# Patient Record
Sex: Female | Born: 1940 | Race: Black or African American | Hispanic: No | Smoking: Former smoker
Health system: Southern US, Community
[De-identification: ages and names within clinical notes are randomized; demographics above are authoritative.]

## PROBLEM LIST (undated history)

## (undated) ENCOUNTER — Emergency Department (HOSPITAL_COMMUNITY): Disposition: A | Discharge status: Other Acute Inpatient Hospital | Payer: Medicare HMO

## (undated) DIAGNOSIS — I447 Left bundle-branch block, unspecified: Secondary | ICD-10-CM

## (undated) DIAGNOSIS — E785 Hyperlipidemia, unspecified: Secondary | ICD-10-CM

## (undated) DIAGNOSIS — N289 Disorder of kidney and ureter, unspecified: Secondary | ICD-10-CM

## (undated) DIAGNOSIS — L309 Dermatitis, unspecified: Secondary | ICD-10-CM

## (undated) DIAGNOSIS — E119 Type 2 diabetes mellitus without complications: Secondary | ICD-10-CM

## (undated) DIAGNOSIS — I10 Essential (primary) hypertension: Secondary | ICD-10-CM

## (undated) DIAGNOSIS — J329 Chronic sinusitis, unspecified: Secondary | ICD-10-CM

## (undated) DIAGNOSIS — M1990 Unspecified osteoarthritis, unspecified site: Secondary | ICD-10-CM

## (undated) DIAGNOSIS — K219 Gastro-esophageal reflux disease without esophagitis: Secondary | ICD-10-CM

## (undated) DIAGNOSIS — Z9989 Dependence on other enabling machines and devices: Secondary | ICD-10-CM

## (undated) DIAGNOSIS — G43909 Migraine, unspecified, not intractable, without status migrainosus: Secondary | ICD-10-CM

## (undated) DIAGNOSIS — R51 Headache: Secondary | ICD-10-CM

## (undated) DIAGNOSIS — R519 Headache, unspecified: Secondary | ICD-10-CM

## (undated) DIAGNOSIS — D472 Monoclonal gammopathy: Secondary | ICD-10-CM

## (undated) DIAGNOSIS — I82409 Acute embolism and thrombosis of unspecified deep veins of unspecified lower extremity: Secondary | ICD-10-CM

## (undated) DIAGNOSIS — E039 Hypothyroidism, unspecified: Secondary | ICD-10-CM

## (undated) DIAGNOSIS — G4733 Obstructive sleep apnea (adult) (pediatric): Secondary | ICD-10-CM

## (undated) HISTORY — DX: Dermatitis, unspecified: L30.9

## (undated) HISTORY — DX: Type 2 diabetes mellitus without complications: E11.9

## (undated) HISTORY — DX: Obstructive sleep apnea (adult) (pediatric): G47.33

## (undated) HISTORY — DX: Hypothyroidism, unspecified: E03.9

## (undated) HISTORY — PX: CARPAL TUNNEL RELEASE: SHX101

## (undated) HISTORY — PX: PLANTAR FASCIA RELEASE: SHX2239

## (undated) HISTORY — DX: Chronic sinusitis, unspecified: J32.9

## (undated) HISTORY — PX: NASAL SINUS SURGERY: SHX719

## (undated) HISTORY — DX: Dependence on other enabling machines and devices: Z99.89

## (undated) HISTORY — DX: Unspecified osteoarthritis, unspecified site: M19.90

## (undated) HISTORY — DX: Left bundle-branch block, unspecified: I44.7

## (undated) HISTORY — DX: Hyperlipidemia, unspecified: E78.5

---

## 1963-02-05 HISTORY — PX: TONSILLECTOMY: SUR1361

## 1967-02-05 HISTORY — PX: ABDOMINAL HYSTERECTOMY: SHX81

## 1974-02-04 HISTORY — PX: CHOLECYSTECTOMY: SHX55

## 1997-06-15 ENCOUNTER — Other Ambulatory Visit: Admission: RE | Admit: 1997-06-15 | Discharge: 1997-06-15 | Payer: Self-pay | Admitting: Otolaryngology

## 1998-11-10 ENCOUNTER — Ambulatory Visit (HOSPITAL_BASED_OUTPATIENT_CLINIC_OR_DEPARTMENT_OTHER): Admission: RE | Admit: 1998-11-10 | Discharge: 1998-11-10 | Payer: Self-pay | Admitting: Orthopedic Surgery

## 1999-01-17 ENCOUNTER — Encounter: Payer: Self-pay | Admitting: Family Medicine

## 1999-01-17 ENCOUNTER — Ambulatory Visit (HOSPITAL_COMMUNITY): Admission: RE | Admit: 1999-01-17 | Discharge: 1999-01-17 | Payer: Self-pay

## 1999-03-13 ENCOUNTER — Encounter (INDEPENDENT_AMBULATORY_CARE_PROVIDER_SITE_OTHER): Payer: Self-pay | Admitting: *Deleted

## 1999-03-13 ENCOUNTER — Ambulatory Visit (HOSPITAL_BASED_OUTPATIENT_CLINIC_OR_DEPARTMENT_OTHER): Admission: RE | Admit: 1999-03-13 | Discharge: 1999-03-13 | Payer: Self-pay | Admitting: Orthopedic Surgery

## 2000-03-14 ENCOUNTER — Ambulatory Visit (HOSPITAL_COMMUNITY): Admission: RE | Admit: 2000-03-14 | Discharge: 2000-03-14 | Payer: Self-pay | Admitting: Family Medicine

## 2000-03-14 ENCOUNTER — Encounter: Payer: Self-pay | Admitting: Family Medicine

## 2000-05-06 ENCOUNTER — Encounter: Payer: Self-pay | Admitting: Family Medicine

## 2000-05-06 ENCOUNTER — Ambulatory Visit (HOSPITAL_COMMUNITY): Admission: RE | Admit: 2000-05-06 | Discharge: 2000-05-06 | Payer: Self-pay | Admitting: Family Medicine

## 2000-06-05 ENCOUNTER — Emergency Department (HOSPITAL_COMMUNITY): Admission: EM | Admit: 2000-06-05 | Discharge: 2000-06-05 | Payer: Self-pay

## 2000-11-16 ENCOUNTER — Ambulatory Visit (HOSPITAL_BASED_OUTPATIENT_CLINIC_OR_DEPARTMENT_OTHER): Admission: RE | Admit: 2000-11-16 | Discharge: 2000-11-16 | Payer: Self-pay | Admitting: Family Medicine

## 2001-01-23 ENCOUNTER — Ambulatory Visit (HOSPITAL_COMMUNITY): Admission: RE | Admit: 2001-01-23 | Discharge: 2001-01-23 | Payer: Self-pay | Admitting: Family Medicine

## 2001-07-22 ENCOUNTER — Encounter: Payer: Self-pay | Admitting: Family Medicine

## 2001-07-22 ENCOUNTER — Ambulatory Visit (HOSPITAL_COMMUNITY): Admission: RE | Admit: 2001-07-22 | Discharge: 2001-07-22 | Payer: Self-pay | Admitting: Family Medicine

## 2001-09-06 ENCOUNTER — Emergency Department (HOSPITAL_COMMUNITY): Admission: EM | Admit: 2001-09-06 | Discharge: 2001-09-07 | Payer: Self-pay | Admitting: Emergency Medicine

## 2001-09-07 ENCOUNTER — Encounter: Payer: Self-pay | Admitting: Emergency Medicine

## 2001-09-08 ENCOUNTER — Encounter: Admission: RE | Admit: 2001-09-08 | Discharge: 2001-11-17 | Payer: Self-pay | Admitting: Internal Medicine

## 2001-09-19 ENCOUNTER — Emergency Department (HOSPITAL_COMMUNITY): Admission: EM | Admit: 2001-09-19 | Discharge: 2001-09-19 | Payer: Self-pay | Admitting: Emergency Medicine

## 2001-09-19 ENCOUNTER — Encounter: Payer: Self-pay | Admitting: Emergency Medicine

## 2001-11-18 ENCOUNTER — Emergency Department (HOSPITAL_COMMUNITY): Admission: EM | Admit: 2001-11-18 | Discharge: 2001-11-18 | Payer: Self-pay | Admitting: Emergency Medicine

## 2003-02-09 ENCOUNTER — Encounter (INDEPENDENT_AMBULATORY_CARE_PROVIDER_SITE_OTHER): Payer: Self-pay | Admitting: *Deleted

## 2003-02-09 ENCOUNTER — Ambulatory Visit (HOSPITAL_COMMUNITY): Admission: RE | Admit: 2003-02-09 | Discharge: 2003-02-09 | Payer: Self-pay | Admitting: Gastroenterology

## 2003-09-24 ENCOUNTER — Emergency Department (HOSPITAL_COMMUNITY): Admission: EM | Admit: 2003-09-24 | Discharge: 2003-09-24 | Payer: Self-pay | Admitting: *Deleted

## 2003-11-23 ENCOUNTER — Emergency Department (HOSPITAL_COMMUNITY): Admission: EM | Admit: 2003-11-23 | Discharge: 2003-11-23 | Payer: Self-pay | Admitting: Emergency Medicine

## 2004-02-12 ENCOUNTER — Emergency Department (HOSPITAL_COMMUNITY): Admission: EM | Admit: 2004-02-12 | Discharge: 2004-02-12 | Payer: Self-pay | Admitting: Family Medicine

## 2004-02-25 ENCOUNTER — Emergency Department (HOSPITAL_COMMUNITY): Admission: EM | Admit: 2004-02-25 | Discharge: 2004-02-26 | Payer: Self-pay | Admitting: Emergency Medicine

## 2004-07-05 ENCOUNTER — Encounter: Admission: RE | Admit: 2004-07-05 | Discharge: 2004-07-05 | Payer: Self-pay | Admitting: Internal Medicine

## 2004-07-13 ENCOUNTER — Encounter: Admission: RE | Admit: 2004-07-13 | Discharge: 2004-07-13 | Payer: Self-pay | Admitting: Internal Medicine

## 2004-08-31 ENCOUNTER — Emergency Department (HOSPITAL_COMMUNITY): Admission: EM | Admit: 2004-08-31 | Discharge: 2004-08-31 | Payer: Self-pay | Admitting: Family Medicine

## 2004-09-03 ENCOUNTER — Emergency Department (HOSPITAL_COMMUNITY): Admission: EM | Admit: 2004-09-03 | Discharge: 2004-09-03 | Payer: Self-pay | Admitting: Family Medicine

## 2004-09-19 ENCOUNTER — Emergency Department (HOSPITAL_COMMUNITY): Admission: EM | Admit: 2004-09-19 | Discharge: 2004-09-19 | Payer: Self-pay | Admitting: Family Medicine

## 2004-10-15 ENCOUNTER — Emergency Department (HOSPITAL_COMMUNITY): Admission: EM | Admit: 2004-10-15 | Discharge: 2004-10-16 | Payer: Self-pay | Admitting: Emergency Medicine

## 2005-01-17 ENCOUNTER — Encounter: Admission: RE | Admit: 2005-01-17 | Discharge: 2005-01-17 | Payer: Self-pay | Admitting: Internal Medicine

## 2005-03-10 ENCOUNTER — Emergency Department (HOSPITAL_COMMUNITY): Admission: EM | Admit: 2005-03-10 | Discharge: 2005-03-10 | Payer: Self-pay | Admitting: Family Medicine

## 2005-04-17 ENCOUNTER — Emergency Department (HOSPITAL_COMMUNITY): Admission: EM | Admit: 2005-04-17 | Discharge: 2005-04-18 | Payer: Self-pay | Admitting: Emergency Medicine

## 2005-07-06 ENCOUNTER — Emergency Department (HOSPITAL_COMMUNITY): Admission: EM | Admit: 2005-07-06 | Discharge: 2005-07-06 | Payer: Self-pay | Admitting: Family Medicine

## 2005-07-22 ENCOUNTER — Encounter: Admission: RE | Admit: 2005-07-22 | Discharge: 2005-07-22 | Payer: Self-pay | Admitting: Internal Medicine

## 2005-10-27 ENCOUNTER — Emergency Department (HOSPITAL_COMMUNITY): Admission: EM | Admit: 2005-10-27 | Discharge: 2005-10-27 | Payer: Self-pay | Admitting: *Deleted

## 2005-12-24 ENCOUNTER — Emergency Department (HOSPITAL_COMMUNITY): Admission: EM | Admit: 2005-12-24 | Discharge: 2005-12-24 | Payer: Self-pay | Admitting: Family Medicine

## 2006-03-12 ENCOUNTER — Ambulatory Visit (HOSPITAL_COMMUNITY): Admission: RE | Admit: 2006-03-12 | Discharge: 2006-03-12 | Payer: Self-pay | Admitting: Gastroenterology

## 2006-03-12 ENCOUNTER — Encounter (INDEPENDENT_AMBULATORY_CARE_PROVIDER_SITE_OTHER): Payer: Self-pay | Admitting: Specialist

## 2006-07-25 ENCOUNTER — Encounter: Admission: RE | Admit: 2006-07-25 | Discharge: 2006-07-25 | Payer: Self-pay | Admitting: Internal Medicine

## 2007-02-12 ENCOUNTER — Emergency Department (HOSPITAL_COMMUNITY): Admission: EM | Admit: 2007-02-12 | Discharge: 2007-02-12 | Payer: Self-pay | Admitting: Family Medicine

## 2007-05-22 ENCOUNTER — Emergency Department (HOSPITAL_COMMUNITY): Admission: EM | Admit: 2007-05-22 | Discharge: 2007-05-22 | Payer: Self-pay | Admitting: Emergency Medicine

## 2007-12-28 ENCOUNTER — Emergency Department (HOSPITAL_COMMUNITY): Admission: EM | Admit: 2007-12-28 | Discharge: 2007-12-28 | Payer: Self-pay | Admitting: Emergency Medicine

## 2008-03-10 ENCOUNTER — Emergency Department (HOSPITAL_COMMUNITY): Admission: EM | Admit: 2008-03-10 | Discharge: 2008-03-10 | Payer: Self-pay | Admitting: Emergency Medicine

## 2008-03-17 ENCOUNTER — Emergency Department (HOSPITAL_BASED_OUTPATIENT_CLINIC_OR_DEPARTMENT_OTHER): Admission: EM | Admit: 2008-03-17 | Discharge: 2008-03-17 | Payer: Self-pay | Admitting: Emergency Medicine

## 2008-06-16 ENCOUNTER — Emergency Department (HOSPITAL_COMMUNITY): Admission: EM | Admit: 2008-06-16 | Discharge: 2008-06-16 | Payer: Self-pay | Admitting: Family Medicine

## 2008-09-20 ENCOUNTER — Emergency Department (HOSPITAL_COMMUNITY): Admission: EM | Admit: 2008-09-20 | Discharge: 2008-09-20 | Payer: Self-pay | Admitting: Family Medicine

## 2008-09-20 HISTORY — PX: US ECHOCARDIOGRAPHY: HXRAD669

## 2008-10-11 ENCOUNTER — Encounter: Admission: RE | Admit: 2008-10-11 | Discharge: 2008-10-11 | Payer: Self-pay | Admitting: Internal Medicine

## 2009-02-06 ENCOUNTER — Emergency Department (HOSPITAL_COMMUNITY): Admission: EM | Admit: 2009-02-06 | Discharge: 2009-02-06 | Payer: Self-pay | Admitting: Emergency Medicine

## 2009-06-26 ENCOUNTER — Ambulatory Visit: Payer: Self-pay | Admitting: Radiology

## 2009-06-26 ENCOUNTER — Emergency Department (HOSPITAL_BASED_OUTPATIENT_CLINIC_OR_DEPARTMENT_OTHER): Admission: EM | Admit: 2009-06-26 | Discharge: 2009-06-26 | Payer: Self-pay | Admitting: Emergency Medicine

## 2009-07-31 ENCOUNTER — Emergency Department (HOSPITAL_COMMUNITY): Admission: EM | Admit: 2009-07-31 | Discharge: 2009-07-31 | Payer: Self-pay | Admitting: Emergency Medicine

## 2009-09-18 ENCOUNTER — Encounter (INDEPENDENT_AMBULATORY_CARE_PROVIDER_SITE_OTHER): Payer: Self-pay | Admitting: Orthopaedic Surgery

## 2009-09-18 ENCOUNTER — Ambulatory Visit: Admission: RE | Admit: 2009-09-18 | Discharge: 2009-09-18 | Payer: Self-pay | Admitting: Orthopaedic Surgery

## 2009-10-26 ENCOUNTER — Emergency Department (HOSPITAL_COMMUNITY): Admission: EM | Admit: 2009-10-26 | Discharge: 2009-10-26 | Payer: Self-pay | Admitting: Emergency Medicine

## 2009-10-31 ENCOUNTER — Encounter: Admission: RE | Admit: 2009-10-31 | Discharge: 2009-10-31 | Payer: Self-pay | Admitting: Internal Medicine

## 2010-02-13 HISTORY — PX: NM MYOVIEW LTD: HXRAD82

## 2010-02-25 ENCOUNTER — Encounter: Payer: Self-pay | Admitting: Internal Medicine

## 2010-03-31 ENCOUNTER — Emergency Department (INDEPENDENT_AMBULATORY_CARE_PROVIDER_SITE_OTHER): Payer: Medicare Other

## 2010-03-31 ENCOUNTER — Emergency Department (HOSPITAL_BASED_OUTPATIENT_CLINIC_OR_DEPARTMENT_OTHER)
Admission: EM | Admit: 2010-03-31 | Discharge: 2010-03-31 | Disposition: A | Discharge status: Home / Self Care | Payer: Medicare Other | Attending: Emergency Medicine | Admitting: Emergency Medicine

## 2010-03-31 DIAGNOSIS — R1110 Vomiting, unspecified: Secondary | ICD-10-CM

## 2010-03-31 DIAGNOSIS — R079 Chest pain, unspecified: Secondary | ICD-10-CM

## 2010-03-31 DIAGNOSIS — R109 Unspecified abdominal pain: Secondary | ICD-10-CM | POA: Insufficient documentation

## 2010-03-31 DIAGNOSIS — I447 Left bundle-branch block, unspecified: Secondary | ICD-10-CM | POA: Insufficient documentation

## 2010-03-31 DIAGNOSIS — R197 Diarrhea, unspecified: Secondary | ICD-10-CM | POA: Insufficient documentation

## 2010-03-31 DIAGNOSIS — J45909 Unspecified asthma, uncomplicated: Secondary | ICD-10-CM | POA: Insufficient documentation

## 2010-03-31 DIAGNOSIS — E7800 Pure hypercholesterolemia, unspecified: Secondary | ICD-10-CM | POA: Insufficient documentation

## 2010-03-31 DIAGNOSIS — K5289 Other specified noninfective gastroenteritis and colitis: Secondary | ICD-10-CM | POA: Insufficient documentation

## 2010-03-31 LAB — URINALYSIS, ROUTINE W REFLEX MICROSCOPIC
Bilirubin Urine: NEGATIVE
Ketones, ur: NEGATIVE mg/dL
Nitrite: NEGATIVE
Protein, ur: NEGATIVE mg/dL
Specific Gravity, Urine: 1.019 (ref 1.005–1.030)
Urine Glucose, Fasting: NEGATIVE mg/dL
Urobilinogen, UA: 0.2 mg/dL (ref 0.0–1.0)
pH: 6 (ref 5.0–8.0)

## 2010-03-31 LAB — COMPREHENSIVE METABOLIC PANEL
ALT: 38 U/L — ABNORMAL HIGH (ref 0–35)
AST: 27 U/L (ref 0–37)
Albumin: 4.5 g/dL (ref 3.5–5.2)
Alkaline Phosphatase: 101 U/L (ref 39–117)
BUN: 15 mg/dL (ref 6–23)
CO2: 25 mEq/L (ref 19–32)
Calcium: 9.8 mg/dL (ref 8.4–10.5)
Chloride: 107 mEq/L (ref 96–112)
Creatinine, Ser: 0.9 mg/dL (ref 0.4–1.2)
GFR calc Af Amer: >60 mL/min (ref >60)
GFR calc non Af Amer: >60 mL/min (ref >60)
Glucose, Bld: 175 mg/dL — ABNORMAL HIGH (ref 70–99)
Potassium: 4.1 mEq/L (ref 3.5–5.1)
Sodium: 143 mEq/L (ref 135–145)
Total Bilirubin: 0.8 mg/dL (ref 0.3–1.2)
Total Protein: 9.2 g/dL — ABNORMAL HIGH (ref 6.0–8.3)

## 2010-03-31 LAB — DIFFERENTIAL
Basophils Absolute: 0 10*3/uL (ref 0.0–0.1)
Basophils Relative: 0 % (ref 0–1)
Eosinophils Absolute: 0.1 10*3/uL (ref 0.0–0.7)
Eosinophils Relative: 1 % (ref 0–5)
Lymphocytes Relative: 7 % — ABNORMAL LOW (ref 12–46)
Lymphs Abs: 0.5 10*3/uL — ABNORMAL LOW (ref 0.7–4.0)
Monocytes Absolute: 0.3 10*3/uL (ref 0.1–1.0)
Monocytes Relative: 5 % (ref 3–12)
Neutro Abs: 5.4 10*3/uL (ref 1.7–7.7)
Neutrophils Relative %: 87 % — ABNORMAL HIGH (ref 43–77)

## 2010-03-31 LAB — URINE MICROSCOPIC-ADD ON

## 2010-03-31 LAB — CBC
HCT: 37.8 % (ref 36.0–46.0)
Hemoglobin: 13.3 g/dL (ref 12.0–15.0)
MCH: 26.8 pg (ref 26.0–34.0)
MCHC: 35.2 g/dL (ref 30.0–36.0)
MCV: 76.1 fL — ABNORMAL LOW (ref 78.0–100.0)
Platelets: 195 10*3/uL (ref 150–400)
RBC: 4.97 MIL/uL (ref 3.87–5.11)
RDW: 15.8 % — ABNORMAL HIGH (ref 11.5–15.5)
WBC: 6.2 10*3/uL (ref 4.0–10.5)

## 2010-04-02 LAB — URINE CULTURE
Colony Count: 35000
Culture  Setup Time: 201202252105

## 2010-04-19 LAB — BASIC METABOLIC PANEL
BUN: 11 mg/dL (ref 6–23)
CO2: 27 mEq/L (ref 19–32)
Calcium: 9.5 mg/dL (ref 8.4–10.5)
Chloride: 105 mEq/L (ref 96–112)
Creatinine, Ser: 1.03 mg/dL (ref 0.4–1.2)
GFR calc Af Amer: >60 mL/min (ref >60)
GFR calc non Af Amer: 53 mL/min — ABNORMAL LOW (ref >60)
Glucose, Bld: 118 mg/dL — ABNORMAL HIGH (ref 70–99)
Potassium: 3.5 mEq/L (ref 3.5–5.1)
Sodium: 139 mEq/L (ref 135–145)

## 2010-04-19 LAB — CBC
HCT: 33.3 % — ABNORMAL LOW (ref 36.0–46.0)
Hemoglobin: 11.3 g/dL — ABNORMAL LOW (ref 12.0–15.0)
MCH: 28.2 pg (ref 26.0–34.0)
MCHC: 34 g/dL (ref 30.0–36.0)
MCV: 83 fL (ref 78.0–100.0)
Platelets: 204 10*3/uL (ref 150–400)
RBC: 4.02 MIL/uL (ref 3.87–5.11)
RDW: 15.7 % — ABNORMAL HIGH (ref 11.5–15.5)
WBC: 5.6 10*3/uL (ref 4.0–10.5)

## 2010-04-19 LAB — DIFFERENTIAL
Basophils Absolute: 0 10*3/uL (ref 0.0–0.1)
Basophils Relative: 1 % (ref 0–1)
Eosinophils Absolute: 0.1 10*3/uL (ref 0.0–0.7)
Eosinophils Relative: 2 % (ref 0–5)
Lymphocytes Relative: 35 % (ref 12–46)
Lymphs Abs: 2 10*3/uL (ref 0.7–4.0)
Monocytes Absolute: 0.7 10*3/uL (ref 0.1–1.0)
Monocytes Relative: 13 % — ABNORMAL HIGH (ref 3–12)
Neutro Abs: 2.8 10*3/uL (ref 1.7–7.7)
Neutrophils Relative %: 50 % (ref 43–77)

## 2010-04-19 LAB — SEDIMENTATION RATE: Sed Rate: 30 mm/hr — ABNORMAL HIGH (ref 0–22)

## 2010-05-12 LAB — POCT URINALYSIS DIP (DEVICE)
Bilirubin Urine: NEGATIVE
Glucose, UA: NEGATIVE mg/dL
Ketones, ur: NEGATIVE mg/dL
Nitrite: NEGATIVE
Protein, ur: NEGATIVE mg/dL
Specific Gravity, Urine: 1.02 (ref 1.005–1.030)
Urobilinogen, UA: 0.2 mg/dL (ref 0.0–1.0)
pH: 7 (ref 5.0–8.0)

## 2010-06-11 ENCOUNTER — Inpatient Hospital Stay (INDEPENDENT_AMBULATORY_CARE_PROVIDER_SITE_OTHER)
Admission: RE | Admit: 2010-06-11 | Discharge: 2010-06-11 | Disposition: A | Discharge status: Home / Self Care | Payer: Medicare Other | Source: Ambulatory Visit | Attending: Emergency Medicine | Admitting: Emergency Medicine

## 2010-06-11 DIAGNOSIS — J019 Acute sinusitis, unspecified: Secondary | ICD-10-CM

## 2010-06-22 NOTE — Op Note (Signed)
NAME:  Andrea Lowery, Andrea Lowery                     ACCOUNT NO.:  [id number]   MEDICAL RECORD NO.:  [id number]                   PATIENT TYPE:  AMB   LOCATION:  ENDO                                 FACILITY:  [facility]   PHYSICIAN:  Anselmo Rod, M.D.               DATE OF BIRTH:  02-21-1940   DATE OF PROCEDURE:  02/09/2003  DATE OF DISCHARGE:                                 OPERATIVE REPORT   PROCEDURE PERFORMED:  Colonoscopy with snare polypectomy times one and cold  biopsies times four.   ENDOSCOPIST:  Charna Elizabeth, M.D.   INSTRUMENT USED:  Olympus video colonoscope.   INDICATIONS FOR PROCEDURE:  70 year old African-American female with family  history of colon cancer in an aunt.  Undergoing screening colonoscopy to  rule out colonic polyps, masses, etc.   PREPROCEDURE PREPARATION:  Informed consent was procured from the patient.  The patient was fasted for eight hours prior to the procedure and prepped  with a bottle of magnesium citrate and a gallon of GoLYTELY the night prior  to the procedure.   PREPROCEDURE PHYSICAL:  The patient had stable vital signs.  Neck supple.  Chest clear to auscultation.  Abdomen soft with normal bowel sounds.   DESCRIPTION OF PROCEDURE:  The patient was placed in left lateral decubitus  position and sedated with 70 mg of Demerol and 7 mg of Versed intravenously.  Once the patient was adequately sedated and maintained on low flow oxygen  and continuous cardiac monitoring, the Olympus video colonoscope was  advanced from the rectum to the cecum.  The appendicular orifice and  ileocecal valve were clearly visualized and photographed.  A small sessile  polyp was snared from the midright colon.  Three small sessile polyps were  biopsied from the rectosigmoid area.  Small internal hemorrhoids were seen  on retroflexion. There was evidence of sigmoid diverticulosis with  inspissated stool in several of the diverticula.  The transverse colon  appeared  healthy without lesions.   IMPRESSION:  1. Small nonbleeding internal hemorrhoids.  2. Three small sessile polyps biopsied from the rectosigmoid.  3. Sigmoid diverticulosis with stool in several of the diverticula.  4. Normal-appearing transverse colon.  5. Small polyp snared from mid right colon.   RECOMMENDATIONS:  1. Await pathology results.  2. Avoid all nonsteroidals including aspirin for the next four weeks.  3. Outpatient followup in the next two weeks for further recommendations.                                               Anselmo Rod, M.D.    JNM/MEDQ  D:  02/09/2003  T:  02/09/2003  Job:  [id number]   cc:   Candyce Churn. Allyne Gee, M.D.  [address]  [address]  [place]  [place] [address]  Fax:  [phone] 

## 2010-06-22 NOTE — Op Note (Signed)
[facility]  Patient:    Andrea Lowery                   MRN: [id number] Proc. Date: 03/13/99 Adm. Date:  [id number] Attending:  Alinda Deem                           Operative Report  PREOPERATIVE DIAGNOSIS:  Right deQuervains stenosing tenosynovitis with ganglion.  POSTOPERATIVE DIAGNOSIS:  Right deQuervains stenosing tenosynovitis with ganglion.  PROCEDURE:  Right wrist excision of the first dorsal compartment pulley with small attached ganglion.  SURGEON:  Alinda Deem, M.D.  ASSISTANT:  None.  ANESTHESIA:  Regional with IV sedation.  ESTIMATED BLOOD LOSS:  Minimal.  FLUID REPLACEMENT:  400 cc of crystalloid.  DRAINS PLACED:  None.  TOURNIQUET TIME:  20 minutes.  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  She has failed conservative treatment with anti-inflammatory medicines, declined a cortisone shot, and has also failed splinting.  DESCRIPTION OF PROCEDURE:  The patient was identified by arm band and taken to he operating room at [facility], where the appropriate anesthetic monitors were attached, and IV regional anesthesia was induced, with the patient in the supine position.  The right hand and wrist were then prepped and draped in the usual sterile fashion from the fingertips to the  midforearm.  The skin over the first dorsal compartment was identified, and a 1.5 cm incision was made over the first dorsal compartment pulley.  The incision was just into the subcutaneous tissue.  We then used tenotomy scissors to dissect below that, in order to protect branches of the superficial radial nerve.  We identified the pulley  over the first dorsal compartment, and incised this with a #15 blade, and identified the APL tendon.  A second incision had to be made into the tendon sheath to locate the EPB tendon, which was then positively identified and freed. Once we had completely freed up the tendons, we did find a small nodule on the tendon sheath, consistent with a ganglion, and this was excised as well.  The wound was washed out with normal saline solution.  Because it was an IV regional anesthetic, the patient actively moved her fingers.  We observed no evidence of  hanging up of the tendons on the remnants of the pulley.  The wound was irrigated with normal saline solution, and then the skin only was closed with running #5-0 nylon suture.  A dressing of Xeroform, 4 x 4 dressings, sponges, Webril, and a wo inch Ace wrap applied.  The patient was then awakened and taken to the recovery room without difficulty. DD:  03/13/99 TD:  03/13/99 Job: [id number]

## 2010-06-22 NOTE — Op Note (Signed)
NAME:  Andrea Lowery, Andrea Lowery           ACCOUNT NO.:  [id number]   MEDICAL RECORD NO.:  [id number]          PATIENT TYPE:  AMB   LOCATION:  ENDO                         FACILITY:  [facility]   PHYSICIAN:  Anselmo Rod, M.D.  DATE OF BIRTH:  November 24, 1940   DATE OF PROCEDURE:  03/12/2006  DATE OF DISCHARGE:                               OPERATIVE REPORT   PROCEDURE PERFORMED:  Colonoscopy with cold biopsies x3.   ENDOSCOPIST:  Anselmo Rod, M.D.   INSTRUMENT USED:  Pentax video colonoscope.   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  Rule out recurrent polyps,  masses, etc.   PREPROCEDURE PREPARATION:  Informed consent was procured from the  patient.  The patient was fasted for eight hours prior to the procedure  and prepped with a gallon of TriLyte the night prior to the procedure.  The risks and benefits of the procedure, including a 10% miss rate of  cancer and polyps, were discussed with the patient as well.   PREPROCEDURE PHYSICAL:  VITAL SIGNS:  The patient had stable vital  signs.  NECK:  Supple.  CHEST:  Clear to auscultation.  S1, S2 regular.  ABDOMEN:  Soft with normal bowel sounds.   DESCRIPTION OF PROCEDURE:  The patient was placed in the left lateral  decubitus position and sedated with 85 mcg of Fentanyl and 8.5 mg of  fentanyl given intravenously in slow incremental doses.  Once the  patient was adequately sedate and maintained on low-flow oxygen and  continuous cardiac monitoring, the Olympus video colonoscope was  advanced from the rectum to the cecum.  There was a significant amount  of residual stool in the colon. Multiple washes were done.  A few  sigmoid diverticula were noted.  There were stool balls noted in the  right colon that was washed on  multiple occasions.  The appendiceal  orifice and ileocecal valve were clearly visualized and photographed.  The terminal ileum appeared healthy and without lesions.  Retroflexion  in the rectum revealed no abnormalities.  Small external hemorrhoids  were seen on anal inspection.  Two small sessile polyps were biopsied  from the rectosigmoid colon (cold biopsies x3). The patient tolerated  the procedure well without immediate complications.   IMPRESSION:  1. Small external hemorrhoids.  2. Sigmoid diverticulosis with inspissated stool in several of the      diverticula.  3. Two small sessile polyps biopsied from the rectosigmoid colon (cold      biopsies x3).  4. Large amount of residual stool in the colon, small lesions could be      missed.  5. Normal-appearing transverse colon, right colon, cecum and terminal      ileum.   RECOMMENDATIONS:  1. Await pathology results.  2. Repeat colonoscopy depending upon pathology results.  3. Avoid all nonsteroidals including aspirin for the next 2 weeks.  4. Brochures on diverticulosis and a high-fiber diet have been given  to the patient for her education.  5. Outpatient follow-up as the need arises in the future.      Anselmo Rod, M.D.  Electronically Signed     JNM/MEDQ  D:  03/12/2006  T:  03/12/2006  Job:  [id number]   cc:   Merlene Laughter. Renae Gloss, M.D.

## 2010-06-24 ENCOUNTER — Emergency Department (HOSPITAL_BASED_OUTPATIENT_CLINIC_OR_DEPARTMENT_OTHER)
Admission: EM | Admit: 2010-06-24 | Discharge: 2010-06-24 | Disposition: A | Discharge status: Home / Self Care | Payer: Medicare Other | Attending: Emergency Medicine | Admitting: Emergency Medicine

## 2010-06-24 DIAGNOSIS — T63461A Toxic effect of venom of wasps, accidental (unintentional), initial encounter: Secondary | ICD-10-CM | POA: Insufficient documentation

## 2010-06-24 DIAGNOSIS — J45909 Unspecified asthma, uncomplicated: Secondary | ICD-10-CM | POA: Insufficient documentation

## 2010-06-24 DIAGNOSIS — E7800 Pure hypercholesterolemia, unspecified: Secondary | ICD-10-CM | POA: Insufficient documentation

## 2010-06-24 DIAGNOSIS — T6391XA Toxic effect of contact with unspecified venomous animal, accidental (unintentional), initial encounter: Secondary | ICD-10-CM | POA: Insufficient documentation

## 2010-06-24 DIAGNOSIS — I10 Essential (primary) hypertension: Secondary | ICD-10-CM | POA: Insufficient documentation

## 2010-10-05 ENCOUNTER — Other Ambulatory Visit: Payer: Self-pay | Admitting: Internal Medicine

## 2010-10-05 ENCOUNTER — Other Ambulatory Visit (HOSPITAL_COMMUNITY): Payer: Self-pay | Admitting: Internal Medicine

## 2010-10-05 DIAGNOSIS — Z1231 Encounter for screening mammogram for malignant neoplasm of breast: Secondary | ICD-10-CM

## 2010-11-07 LAB — CBC
HCT: 34.8 — ABNORMAL LOW
Hemoglobin: 11.6 — ABNORMAL LOW
MCHC: 33.5
MCV: 84.2
Platelets: 207
RBC: 4.13
RDW: 14.5
WBC: 4.6

## 2010-11-07 LAB — POCT CARDIAC MARKERS
CKMB, poc: 1.1
CKMB, poc: <1 — ABNORMAL LOW
Myoglobin, poc: 74.6
Myoglobin, poc: 80.4
Troponin i, poc: <0.05
Troponin i, poc: <0.05

## 2010-11-07 LAB — POCT I-STAT, CHEM 8
BUN: 13
Calcium, Ion: 1.21
Chloride: 103
Creatinine, Ser: 0.9
Glucose, Bld: 122 — ABNORMAL HIGH
HCT: 35 — ABNORMAL LOW
Hemoglobin: 11.9 — ABNORMAL LOW
Potassium: 3.7
Sodium: 141
TCO2: 27

## 2010-11-07 LAB — DIFFERENTIAL
Basophils Absolute: 0
Basophils Relative: 1
Eosinophils Absolute: 0.1
Eosinophils Relative: 3
Lymphocytes Relative: 43
Lymphs Abs: 2
Monocytes Absolute: 0.6
Monocytes Relative: 12
Neutro Abs: 1.9
Neutrophils Relative %: 41 — ABNORMAL LOW

## 2010-11-16 ENCOUNTER — Ambulatory Visit
Admission: RE | Admit: 2010-11-16 | Discharge: 2010-11-16 | Disposition: A | Discharge status: Home / Self Care | Payer: Medicare Other | Source: Ambulatory Visit | Attending: Internal Medicine | Admitting: Internal Medicine

## 2010-11-16 DIAGNOSIS — Z1231 Encounter for screening mammogram for malignant neoplasm of breast: Secondary | ICD-10-CM

## 2011-01-01 ENCOUNTER — Encounter: Payer: Self-pay | Admitting: Cardiology

## 2011-01-01 ENCOUNTER — Emergency Department (INDEPENDENT_AMBULATORY_CARE_PROVIDER_SITE_OTHER)
Admission: EM | Admit: 2011-01-01 | Discharge: 2011-01-01 | Disposition: A | Discharge status: Home / Self Care | Payer: Medicare Other | Source: Home / Self Care | Attending: Family Medicine | Admitting: Family Medicine

## 2011-01-01 DIAGNOSIS — T6391XA Toxic effect of contact with unspecified venomous animal, accidental (unintentional), initial encounter: Secondary | ICD-10-CM

## 2011-01-01 DIAGNOSIS — Z91030 Bee allergy status: Secondary | ICD-10-CM

## 2011-01-01 DIAGNOSIS — T63441A Toxic effect of venom of bees, accidental (unintentional), initial encounter: Secondary | ICD-10-CM

## 2011-01-01 DIAGNOSIS — T7840XA Allergy, unspecified, initial encounter: Secondary | ICD-10-CM

## 2011-01-01 HISTORY — DX: Essential (primary) hypertension: I10

## 2011-01-01 MED ORDER — TRIAMCINOLONE ACETONIDE 40 MG/ML IJ SUSP
60.0000 mg | Freq: Once | INTRAMUSCULAR | Status: AC
  Administered 2011-01-01: 60 mg via INTRAMUSCULAR

## 2011-01-01 MED ORDER — TRIAMCINOLONE ACETONIDE 40 MG/ML IJ SUSP
INTRAMUSCULAR | Status: AC
  Filled 2011-01-01: qty 5

## 2011-01-01 NOTE — Discharge Instructions (Signed)
Use your medicine as needed, return or see your doctor if further problems

## 2011-01-01 NOTE — ED Notes (Signed)
Pt report sting by yellow jack to right hand 5th digit about 2pm today. She took her epi-pen 0.3mg  just after sting occurred. Denies feeling like throat is closing. Took benadryl 25mg  tab at home. Pt reports chest tightness at this time. Good air movement throughout lung fields.  Pt also took pro-air inhaler before coming to urgent care. Ice applied to right hand. No wheezing noted at present. Ice applied to right hand.

## 2011-01-01 NOTE — ED Provider Notes (Signed)
History     CSN: [id number] Arrival date & time: 01/01/2011  2:22 PM   First MD Initiated Contact with Patient 01/01/11 1426      Chief Complaint  Patient presents with  . Insect Bite    bee sting    (Consider location/radiation/quality/duration/timing/severity/associated sxs/prior treatment) Patient is a 70 y.o. female presenting with intoxication.  Alcohol Intoxication This is a new problem. The current episode started less than 1 hour ago. The problem has been rapidly improving. Associated symptoms include chest pain. Pertinent negatives include no shortness of breath. Treatments tried: used epi-pen and benadryl and albuterol prior to coming. The treatment provided significant relief.    No past medical history on file.  No past surgical history on file.  No family history on file.  History  Substance Use Topics  . Smoking status: Not on file  . Smokeless tobacco: Not on file  . Alcohol Use: Not on file    OB History    No data available      Review of Systems  Constitutional: Negative.   HENT: Negative.   Respiratory: Negative for cough, shortness of breath, wheezing and stridor.   Cardiovascular: Positive for chest pain.  Skin: Negative.     Allergies  Review of patient's allergies indicates not on file.  Home Medications  No current outpatient prescriptions on file.  BP 163/72  Pulse 95  Temp(Src) 98.3 F (36.8 C) (Oral)  Resp 20  SpO2 100%  Physical Exam  Nursing note and vitals reviewed. Constitutional: She is oriented to person, place, and time. She appears well-developed and well-nourished.  HENT:  Head: Normocephalic.  Right Ear: External ear normal.  Left Ear: External ear normal.  Mouth/Throat: Oropharynx is clear and moist.  Neck: Normal range of motion. Neck supple.  Cardiovascular: Normal rate, regular rhythm, normal heart sounds and intact distal pulses.   Pulmonary/Chest: Effort normal and breath sounds normal. She has no  wheezes.  Neurological: She is alert and oriented to person, place, and time.  Skin: Skin is warm and dry. No rash noted.  Psychiatric: Her mood appears anxious.    ED Course  Procedures (including critical care time)  Labs Reviewed - No data to display No results found.   No diagnosis found.    MDM  Sx resolved at d/c        Barkley Bruns, MD 01/01/11 1436

## 2011-01-01 NOTE — ED Notes (Signed)
Pt reports chest tightness has eased. Her hand in the area of sting is itching and throbbing. Pt talking in full sentences. No wheezing noted. Pt aware of signs to seek further treatment.

## 2011-02-12 ENCOUNTER — Other Ambulatory Visit: Payer: Self-pay

## 2011-02-12 ENCOUNTER — Emergency Department (HOSPITAL_BASED_OUTPATIENT_CLINIC_OR_DEPARTMENT_OTHER)
Admission: EM | Admit: 2011-02-12 | Discharge: 2011-02-13 | Disposition: A | Discharge status: Home / Self Care | Payer: Medicare Other | Attending: Emergency Medicine | Admitting: Emergency Medicine

## 2011-02-12 ENCOUNTER — Encounter (HOSPITAL_BASED_OUTPATIENT_CLINIC_OR_DEPARTMENT_OTHER): Payer: Self-pay | Admitting: *Deleted

## 2011-02-12 DIAGNOSIS — J45909 Unspecified asthma, uncomplicated: Secondary | ICD-10-CM | POA: Insufficient documentation

## 2011-02-12 DIAGNOSIS — I6529 Occlusion and stenosis of unspecified carotid artery: Secondary | ICD-10-CM | POA: Insufficient documentation

## 2011-02-12 DIAGNOSIS — Z79899 Other long term (current) drug therapy: Secondary | ICD-10-CM | POA: Insufficient documentation

## 2011-02-12 DIAGNOSIS — I10 Essential (primary) hypertension: Secondary | ICD-10-CM | POA: Insufficient documentation

## 2011-02-12 DIAGNOSIS — E041 Nontoxic single thyroid nodule: Secondary | ICD-10-CM | POA: Insufficient documentation

## 2011-02-12 DIAGNOSIS — R269 Unspecified abnormalities of gait and mobility: Secondary | ICD-10-CM | POA: Insufficient documentation

## 2011-02-12 DIAGNOSIS — R42 Dizziness and giddiness: Secondary | ICD-10-CM | POA: Insufficient documentation

## 2011-02-12 DIAGNOSIS — I658 Occlusion and stenosis of other precerebral arteries: Secondary | ICD-10-CM | POA: Insufficient documentation

## 2011-02-12 DIAGNOSIS — M542 Cervicalgia: Secondary | ICD-10-CM | POA: Insufficient documentation

## 2011-02-12 LAB — CBC
HCT: 37 % (ref 36.0–46.0)
Hemoglobin: 12.5 g/dL (ref 12.0–15.0)
MCH: 27.1 pg (ref 26.0–34.0)
MCHC: 33.8 g/dL (ref 30.0–36.0)
MCV: 80.3 fL (ref 78.0–100.0)
Platelets: 198 10*3/uL (ref 150–400)
RBC: 4.61 MIL/uL (ref 3.87–5.11)
RDW: 14.8 % (ref 11.5–15.5)
WBC: 5.6 10*3/uL (ref 4.0–10.5)

## 2011-02-12 LAB — BASIC METABOLIC PANEL
BUN: 11 mg/dL (ref 6–23)
CO2: 24 mEq/L (ref 19–32)
Calcium: 9.1 mg/dL (ref 8.4–10.5)
Chloride: 106 mEq/L (ref 96–112)
Creatinine, Ser: 0.8 mg/dL (ref 0.50–1.10)
GFR calc Af Amer: 86 mL/min — ABNORMAL LOW (ref >90)
GFR calc non Af Amer: 74 mL/min — ABNORMAL LOW (ref >90)
Glucose, Bld: 113 mg/dL — ABNORMAL HIGH (ref 70–99)
Potassium: 3.6 mEq/L (ref 3.5–5.1)
Sodium: 139 mEq/L (ref 135–145)

## 2011-02-12 LAB — DIFFERENTIAL
Basophils Absolute: 0 10*3/uL (ref 0.0–0.1)
Basophils Relative: 0 % (ref 0–1)
Eosinophils Absolute: 0.1 10*3/uL (ref 0.0–0.7)
Eosinophils Relative: 1 % (ref 0–5)
Lymphocytes Relative: 35 % (ref 12–46)
Lymphs Abs: 2 10*3/uL (ref 0.7–4.0)
Monocytes Absolute: 0.5 10*3/uL (ref 0.1–1.0)
Monocytes Relative: 8 % (ref 3–12)
Neutro Abs: 3.1 10*3/uL (ref 1.7–7.7)
Neutrophils Relative %: 55 % (ref 43–77)

## 2011-02-12 MED ORDER — SODIUM CHLORIDE 0.9 % IV SOLN
Freq: Once | INTRAVENOUS | Status: AC
  Administered 2011-02-12: 23:00:00 via INTRAVENOUS

## 2011-02-12 MED ORDER — DIAZEPAM 5 MG/ML IJ SOLN
5.0000 mg | Freq: Once | INTRAMUSCULAR | Status: AC
  Administered 2011-02-12: 5 mg via INTRAVENOUS
  Filled 2011-02-12: qty 2

## 2011-02-12 MED ORDER — ONDANSETRON HCL 4 MG/2ML IJ SOLN
4.0000 mg | Freq: Once | INTRAMUSCULAR | Status: AC
  Administered 2011-02-12: 4 mg via INTRAVENOUS
  Filled 2011-02-12: qty 2

## 2011-02-12 MED ORDER — MECLIZINE HCL 25 MG PO TABS
25.0000 mg | ORAL_TABLET | Freq: Once | ORAL | Status: AC
  Administered 2011-02-12: 25 mg via ORAL
  Filled 2011-02-12: qty 1

## 2011-02-12 NOTE — ED Notes (Signed)
Dizziness sudden onset tonight. Pain on the left side of her face.

## 2011-02-13 ENCOUNTER — Emergency Department (INDEPENDENT_AMBULATORY_CARE_PROVIDER_SITE_OTHER): Payer: Medicare Other

## 2011-02-13 DIAGNOSIS — R42 Dizziness and giddiness: Secondary | ICD-10-CM

## 2011-02-13 DIAGNOSIS — I658 Occlusion and stenosis of other precerebral arteries: Secondary | ICD-10-CM

## 2011-02-13 DIAGNOSIS — I6529 Occlusion and stenosis of unspecified carotid artery: Secondary | ICD-10-CM

## 2011-02-13 DIAGNOSIS — R5381 Other malaise: Secondary | ICD-10-CM

## 2011-02-13 DIAGNOSIS — E041 Nontoxic single thyroid nodule: Secondary | ICD-10-CM

## 2011-02-13 MED ORDER — IOHEXOL 350 MG/ML SOLN
80.0000 mL | Freq: Once | INTRAVENOUS | Status: AC | PRN
  Administered 2011-02-13: 80 mL via INTRAVENOUS

## 2011-02-13 MED ORDER — DIAZEPAM 5 MG PO TABS: 5.0000 mg | ORAL_TABLET | Freq: Three times a day (TID) | ORAL | Status: AC | PRN

## 2011-02-13 NOTE — ED Provider Notes (Addendum)
History    70yf with vertigo. Sudden onset tonight. Sensation of room spinning. Pain in L neck and L face. Denies trauma. Achy in character. Does not radiate. Constant. No appreciable exacerbating or relieving factors. Nasuea. No vomiting. No fever or chills. No neck stiffness. No visual complaints. No numbness, tingling or loss of strength. L ear feels like is plugged up. Denies tinnitus.   CSN: [id number]  Arrival date & time 02/12/11  2200   First MD Initiated Contact with Patient 02/12/11 2259      Chief Complaint  Patient presents with  . Dizziness    (Consider location/radiation/quality/duration/timing/severity/associated sxs/prior treatment) HPI  Past Medical History  Diagnosis Date  . Asthma   . Hypertension     Past Surgical History  Procedure Date  . Cholecystectomy   . Tonsillectomy   . Abdominal hysterectomy   . Plantar fascia release     No family history on file.  History  Substance Use Topics  . Smoking status: Former Smoker    Quit date: 02/04/2006  . Smokeless tobacco: Not on file  . Alcohol Use: No    OB History    Grav Para Term Preterm Abortions TAB SAB Ect Mult Living                  Review of Systems   Review of symptoms negative unless otherwise noted in HPI.   Allergies  Bee and Penicillins  Home Medications   Current Outpatient Rx  Name Route Sig Dispense Refill  . ALBUTEROL SULFATE HFA 108 (90 BASE) MCG/ACT IN AERS Inhalation Inhale 2 puffs into the lungs every 4 (four) hours as needed. For shortness of breath and wheezing    . ALISKIREN-VALSARTAN 300-320 MG PO TABS Oral Take 1 tablet by mouth daily.      Marland Kitchen VITAMIN D 2000 UNITS PO CAPS Oral Take 1 capsule by mouth daily.      Marland Kitchen EPINEPHRINE 0.3 MG/0.3ML IJ DEVI Intramuscular Inject 0.3 mg into the muscle as needed. For severe allergic reaction    . ESOMEPRAZOLE MAGNESIUM 40 MG PO CPDR Oral Take 40 mg by mouth daily before breakfast.      . EZETIMIBE-SIMVASTATIN 10-40 MG PO TABS  Oral Take 1 tablet by mouth daily.     Marland Kitchen LEVOTHYROXINE SODIUM 75 MCG PO TABS Oral Take 75 mcg by mouth daily.      Marland Kitchen MAGNESIUM OXIDE 600 MG PO CAPS Oral Take 1 capsule by mouth daily.      . OMEGA-3-ACID ETHYL ESTERS 1 G PO CAPS Oral Take 2 g by mouth 2 (two) times daily.     . TOPIRAMATE 100 MG PO TABS Oral Take 100 mg by mouth daily.     Marland Kitchen DIAZEPAM 5 MG PO TABS Oral Take 1 tablet (5 mg total) by mouth every 8 (eight) hours as needed for anxiety. 12 tablet 0    BP 126/62  Pulse 65  Temp(Src) 98.6 F (37 C) (Oral)  Resp 20  SpO2 100%  Physical Exam  Nursing note and vitals reviewed. Constitutional: She is oriented to person, place, and time. She appears well-developed and well-nourished. No distress.  HENT:  Head: Normocephalic and atraumatic.  Right Ear: External ear normal.  Left Ear: External ear normal.       TMs clear b/l. Mild tenderness L lateral neck. Grossly normal in appearance. Necl supple. No nodes.  Eyes: Conjunctivae are normal. Pupils are equal, round, and reactive to light. Right eye exhibits no discharge. Left  eye exhibits no discharge.  Neck: Normal range of motion. Neck supple.  Cardiovascular: Normal rate, regular rhythm and normal heart sounds.  Exam reveals no gallop and no friction rub.   No murmur heard. Pulmonary/Chest: Effort normal and breath sounds normal. No respiratory distress.  Musculoskeletal: She exhibits no edema and no tenderness.  Lymphadenopathy:    She has no cervical adenopathy.  Neurological: She is alert and oriented to person, place, and time. No cranial nerve deficit. She exhibits normal muscle tone. Coordination normal.       Normal appearing gait. Good finger to nose b/l  Skin: Skin is warm. She is not diaphoretic.  Psychiatric: She has a normal mood and affect. Her behavior is normal. Thought content normal.    ED Course  Procedures (including critical care time)  Labs Reviewed  BASIC METABOLIC PANEL - Abnormal; Notable for the  following:    Glucose, Bld 113 (*)    GFR calc non Af Amer 74 (*)    GFR calc Af Amer 86 (*)    All other components within normal limits  CBC  DIFFERENTIAL   No results found.  Ct Angio Head W/cm &/or Wo Cm  02/13/2011  *RADIOLOGY REPORT*  Clinical Data:  Weakness, dizziness, unsteady gait.  Evaluate for left carotid dissection.  CT ANGIOGRAPHY HEAD AND NECK  Technique:  Multidetector CT imaging of the head and neck was performed using the standard protocol during bolus administration of intravenous contrast.  Multiplanar CT image reconstructions including MIPs were obtained to evaluate the vascular anatomy. Carotid stenosis measurements (when applicable) are obtained utilizing NASCET criteria, using the distal internal carotid diameter as the denominator.  Contrast: 80mL OMNIPAQUE IOHEXOL 350 MG/ML IV SOLN,  Comparison:  CT head 10/26/2009  CTA NECK  Findings:  Negative for carotid dissection.  No significant carotid stenosis.  10 mm left thyroid nodule is indeterminate.  Follow up thyroid ultrasound is suggested.  Lung apices are clear with mild bleb formation bilaterally.  Right carotid:  Right common carotid artery is widely patent. Minimal atherosclerotic calcification in the carotid bulb without significant carotid stenosis.  Negative for dissection or aneurysm.  Left carotid:  Left common carotid artery is widely patent.  Mild atherosclerotic calcification in the carotid bulb without significant stenosis.  Negative for dissection or aneurysm.  Vertebral arteries:  Both vertebral arteries are patent to the basilar without significant stenosis.  Both vertebral arteries are equal in size.   Review of the MIP images confirms the above findings.  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  No significant carotid stenosis or dissection.  1 cm left thyroid nodule.  Recommend elective follow up thyroid ultrasound.  CTA HEAD  Findings:  Ventricle size is normal.  No  acute infarct or mass.  No intracranial hemorrhage is identified.  No enhancing lesions are seen post contrast.  Mild atherosclerotic calcification in the cavernous carotid bilaterally without significant stenosis.  Anterior and middle cerebral arteries are patent bilaterally without stenosis.  Both vertebral arteries are patent to the basilar.  The basilar is widely patent.  PICA, AICA, superior cerebellar, and posterior cerebral arteries are patent bilaterally.  Negative for cerebral aneurysm.   Review of the MIP images confirms the above findings.  IMPRESSION: No significant intracranial stenosis is identified.  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  Original Report Authenticated By: Camelia Phenes, M.D.   Ct Angio Neck W/cm &/or Wo/cm  02/13/2011  *RADIOLOGY REPORT*  Clinical Data:  Weakness, dizziness, unsteady gait.  Evaluate for left carotid dissection.  CT ANGIOGRAPHY HEAD AND NECK  Technique:  Multidetector CT imaging of the head and neck was performed using the standard protocol during bolus administration of intravenous contrast.  Multiplanar CT image reconstructions including MIPs were obtained to evaluate the vascular anatomy. Carotid stenosis measurements (when applicable) are obtained utilizing NASCET criteria, using the distal internal carotid diameter as the denominator.  Contrast: 80mL OMNIPAQUE IOHEXOL 350 MG/ML IV SOLN,  Comparison:  CT head 10/26/2009  CTA NECK  Findings:  Negative for carotid dissection.  No significant carotid stenosis.  10 mm left thyroid nodule is indeterminate.  Follow up thyroid ultrasound is suggested.  Lung apices are clear with mild bleb formation bilaterally.  Right carotid:  Right common carotid artery is widely patent. Minimal atherosclerotic calcification in the carotid bulb without significant carotid stenosis.  Negative for dissection or aneurysm.  Left carotid:  Left common carotid artery is widely patent.  Mild atherosclerotic  calcification in the carotid bulb without significant stenosis.  Negative for dissection or aneurysm.  Vertebral arteries:  Both vertebral arteries are patent to the basilar without significant stenosis.  Both vertebral arteries are equal in size.   Review of the MIP images confirms the above findings.  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  No significant carotid stenosis or dissection.  1 cm left thyroid nodule.  Recommend elective follow up thyroid ultrasound.  CTA HEAD  Findings:  Ventricle size is normal.  No acute infarct or mass.  No intracranial hemorrhage is identified.  No enhancing lesions are seen post contrast.  Mild atherosclerotic calcification in the cavernous carotid bilaterally without significant stenosis.  Anterior and middle cerebral arteries are patent bilaterally without stenosis.  Both vertebral arteries are patent to the basilar.  The basilar is widely patent.  PICA, AICA, superior cerebellar, and posterior cerebral arteries are patent bilaterally.  Negative for cerebral aneurysm.   Review of the MIP images confirms the above findings.  IMPRESSION: No significant intracranial stenosis is identified.  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  Original Report Authenticated By: Camelia Phenes, M.D.   EKG:  Rhythm: normal sinus Rate: 61 Axis: R Intervals: 1st degree AV block. LBBB ST segments: appropriate discordance    1. Vertigo   2. Neck pain       MDM  70yf with vertigo. Suspect peripheral cause with L ear fullness. CTA done to eval for carotid dissection given L neck pain. Negative for dissection or other acute pathology. Labs unremarkable. Pt feeling better after meds and ambulated without assistance to bathroom prior to DC. Neuro exam nonfocal. Plan symptomatic tx and outpt fu. Strict return precautions discussed.         Raeford Razor, MD 02/20/11 1326  Raeford Razor, MD 03/28/11 [id number]

## 2011-02-13 NOTE — ED Notes (Signed)
See paper progress notes for pt care documentation during computer downtime.

## 2011-02-13 NOTE — ED Notes (Signed)
Pt returned from CT °

## 2011-02-13 NOTE — ED Notes (Signed)
Pt states she feels better.

## 2011-02-13 NOTE — Discharge Instructions (Signed)
Dizziness Dizziness is a common problem. It is a feeling of unsteadiness or lightheadedness. You may feel like you are about to faint. Dizziness can lead to injury if you stumble or fall. A person of any age group can suffer from dizziness, but dizziness is more common in older adults. CAUSES  Dizziness can be caused by many different things, including:  Middle ear problems.   Standing for too long.   Infections.   An allergic reaction.   Aging.   An emotional response to something, such as the sight of blood.   Side effects of medicines.   Fatigue.   Problems with circulation or blood pressure.   Excess use of alcohol, medicines, or illegal drug use.   Breathing too fast (hyperventilation).   An arrhythmia or problems with your heart rhythm.   Anemia or a low blood count.   Pregnancy.   Vomiting, diarrhea, fever, or other illnesses that cause dehydration.   Diseases or conditions such as Parkinson's disease, high blood pressure (hypertension), diabetes, and thyroid problems.   Exposure to extreme heat.  DIAGNOSIS  To find the cause of your dizziness, your caregiver may do a physical exam, lab tests, radiologic imaging scans, or an electrocardiography test (ECG).  TREATMENT  Treatment of dizziness depends on the cause of your symptoms and can vary greatly. HOME CARE INSTRUCTIONS   Drink enough fluids to keep your urine clear or pale yellow. This is especially important in very hot weather. In the elderly, it is also important in cold weather.   If your dizziness is caused by medicines, take them exactly as directed. When taking blood pressure medicines, it is especially important to get up slowly.   Rise slowly from chairs and steady yourself until you feel okay.   In the morning, first sit up on the side of the bed. When this seems okay, stand slowly while holding onto something until you know your balance is fine.   If you need to stand in one place for a long  time, be sure to move your legs often. Tighten and relax the muscles in your legs while standing.   If dizziness continues to be a problem, have someone stay with you for a day or two. Do this until you feel you are well enough to stay alone. Have the person call your caregiver if he or she notices changes in you that are concerning.   Do not drive or use heavy machinery if you feel dizzy.  SEEK IMMEDIATE MEDICAL CARE IF:   Your dizziness or lightheadedness gets worse.   You feel nauseous or vomit.   You develop problems with talking, walking, weakness, or using your arms, hands, or legs.   You are not thinking clearly or you have difficulty forming sentences. It may take a friend or family member to determine if your thinking is normal.   You develop chest pain, abdominal pain, shortness of breath, or sweating.   Your vision changes.   You notice any bleeding.   You have side effects from medicine that seems to be getting worse rather than better.  MAKE SURE YOU:   Understand these instructions.   Will watch your condition.   Will get help right away if you are not doing well or get worse.  Document Released: 07/17/2000 Document Revised: 09/25/2010 Document Reviewed: 08/10/2010 ExitCare Patient Information 2012 [employer].  RESOURCE GUIDE  Dental Problems  Patients with Medicaid: [facility]                       [facility] [address]                                           [address] Phone:  [phone]                                                  Phone:  [phone]  If unable to pay or uninsured, contact:  [facility] or [facility] to become qualified for the adult dental clinic.  Chronic Pain Problems Contact [facility]  [phone] Patients need to be referred by their primary care doctor.  Insufficient Money for Medicine Contact [employer]:  call "211" or [facility]  271-5999.  No Primary Care Doctor Call [facility]  832-8000 Other agencies that provide inexpensive medical care    [facility]  [phone]     Internal Medicine  [phone]    [facility]  [phone]    Women's Clinic  [phone]    [facility]  [phone]    [facility]  [phone]  Psychological Services [facility]  [phone] [facility]  [phone] [facility]   800 853-5163 (emergency services 641-4993)  Substance Abuse Resources [facility]  336-882-2125 [facility] 336-784-9470 [facility] 336-285-9073 [facility] 336-845-3988 Residential & Outpatient Substance Abuse Program  800-659-3381  Abuse/Neglect [place] [facility] Hotline (336) 641-3795 [place] [facility] Abuse Hotline 800-378-5315 (After Hours)  Emergency Shelter [facility] (336) 271-5985  [facility] (336) 275-9566 [facility] (704) 372-4663  MRSA Hotline #:   832-7006    [place] [facility]  [facility] [place]     [employer]                          [facility] [address] [place]                       [address]      [address]                                                  [place]                            [place] Phone:  [phone]                                   Phone:  [phone]                 Phone:  [phone]  [facility] Phone:  [phone]  [place] Child Abuse Hotline (336) 342-1394 (336) 342-3537 (After Hours)   

## 2011-02-13 NOTE — ED Notes (Signed)
Pt ambulatory to the bathroom with assistance. States she feels like she is ready to go home.

## 2011-03-08 ENCOUNTER — Encounter (HOSPITAL_COMMUNITY): Payer: Self-pay | Admitting: Emergency Medicine

## 2011-03-08 ENCOUNTER — Emergency Department (INDEPENDENT_AMBULATORY_CARE_PROVIDER_SITE_OTHER)
Admission: EM | Admit: 2011-03-08 | Discharge: 2011-03-08 | Disposition: A | Discharge status: Home / Self Care | Payer: Medicare Other | Source: Home / Self Care | Attending: Family Medicine | Admitting: Family Medicine

## 2011-03-08 DIAGNOSIS — J069 Acute upper respiratory infection, unspecified: Secondary | ICD-10-CM

## 2011-03-08 MED ORDER — AZITHROMYCIN 250 MG PO TABS: ORAL_TABLET | ORAL | Status: DC

## 2011-03-08 MED ORDER — DEXTROMETHORPHAN POLISTIREX 30 MG/5ML PO LQCR: 60.0000 mg | ORAL | Status: AC | PRN

## 2011-03-08 NOTE — ED Provider Notes (Signed)
History     CSN: [id number]  Arrival date & time 03/08/11  1334   First MD Initiated Contact with Patient 03/08/11 1401      Chief Complaint  Patient presents with  . Sinusitis    (Consider location/radiation/quality/duration/timing/severity/associated sxs/prior treatment) Patient is a 71 y.o. female presenting with cough. The history is provided by the patient.  Cough This is a new problem. The current episode started more than 2 days ago. The problem has not changed since onset.The cough is non-productive. There has been no fever. Associated symptoms include rhinorrhea. She is not a smoker.    Past Medical History  Diagnosis Date  . Asthma   . Hypertension     Past Surgical History  Procedure Date  . Cholecystectomy   . Tonsillectomy   . Abdominal hysterectomy   . Plantar fascia release     No family history on file.  History  Substance Use Topics  . Smoking status: Former Smoker    Quit date: 02/04/2006  . Smokeless tobacco: Not on file  . Alcohol Use: No    OB History    Grav Para Term Preterm Abortions TAB SAB Ect Mult Living                  Review of Systems  Constitutional: Negative.   HENT: Positive for nosebleeds, congestion, rhinorrhea and postnasal drip.   Respiratory: Positive for cough.   Gastrointestinal: Negative.   Skin: Negative.     Allergies  Bee and Penicillins  Home Medications   Current Outpatient Rx  Name Route Sig Dispense Refill  . ALBUTEROL SULFATE HFA 108 (90 BASE) MCG/ACT IN AERS Inhalation Inhale 2 puffs into the lungs every 4 (four) hours as needed. For shortness of breath and wheezing    . ALISKIREN-VALSARTAN 300-320 MG PO TABS Oral Take 1 tablet by mouth daily.      . AZITHROMYCIN 250 MG PO TABS  Take as directed on pack 6 each 0  . VITAMIN D 2000 UNITS PO CAPS Oral Take 1 capsule by mouth daily.      Marland Kitchen DEXTROMETHORPHAN POLISTIREX ER 30 MG/5ML PO LQCR Oral Take 10 mLs (60 mg total) by mouth as needed for cough. 89 mL  0  . EPINEPHRINE 0.3 MG/0.3ML IJ DEVI Intramuscular Inject 0.3 mg into the muscle as needed. For severe allergic reaction    . ESOMEPRAZOLE MAGNESIUM 40 MG PO CPDR Oral Take 40 mg by mouth daily before breakfast.      . EZETIMIBE-SIMVASTATIN 10-40 MG PO TABS Oral Take 1 tablet by mouth daily.     Marland Kitchen LEVOTHYROXINE SODIUM 75 MCG PO TABS Oral Take 75 mcg by mouth daily.      Marland Kitchen MAGNESIUM OXIDE 600 MG PO CAPS Oral Take 1 capsule by mouth daily.      . OMEGA-3-ACID ETHYL ESTERS 1 G PO CAPS Oral Take 2 g by mouth 2 (two) times daily.     . TOPIRAMATE 100 MG PO TABS Oral Take 100 mg by mouth daily.       BP 164/89  Pulse 73  Temp(Src) 97.6 F (36.4 C) (Oral)  Resp 18  SpO2 96%  Physical Exam  Nursing note and vitals reviewed. Constitutional: She is oriented to person, place, and time. She appears well-developed and well-nourished.  HENT:  Head: Normocephalic.  Right Ear: External ear normal.  Left Ear: External ear normal.  Nose: Mucosal edema and rhinorrhea present. Epistaxis is observed.  Mouth/Throat: Oropharynx is clear and moist.  Eyes: Conjunctivae are normal. Pupils are equal, round, and reactive to light.  Neck: Normal range of motion. Neck supple.  Cardiovascular: Normal rate.   Pulmonary/Chest: Effort normal and breath sounds normal.  Lymphadenopathy:    She has no cervical adenopathy.  Neurological: She is alert and oriented to person, place, and time.  Skin: Skin is warm and dry.    ED Course  Procedures (including critical care time)  Labs Reviewed - No data to display No results found.   1. URI (upper respiratory infection)       MDM          Barkley Bruns, MD 03/08/11 [phone]

## 2011-03-08 NOTE — ED Notes (Signed)
PT HERE WITH SINUS PRESSURE,NASAL YELLOW,THICK DRAINAGE AND DRY COUGH THAT STARTED Wednesday.NO FEVERS

## 2011-03-08 NOTE — Discharge Instructions (Signed)
Drink plenty of fluids as discussed, use medicine as prescribed, and mucinex or delsym for cough. Return or see your doctor if further problems °

## 2011-06-04 ENCOUNTER — Other Ambulatory Visit: Payer: Self-pay | Admitting: Allergy and Immunology

## 2011-06-04 ENCOUNTER — Ambulatory Visit
Admission: RE | Admit: 2011-06-04 | Discharge: 2011-06-04 | Disposition: A | Discharge status: Home / Self Care | Payer: Medicare Other | Source: Ambulatory Visit | Attending: Allergy and Immunology | Admitting: Allergy and Immunology

## 2011-06-04 DIAGNOSIS — R05 Cough: Secondary | ICD-10-CM

## 2011-06-04 DIAGNOSIS — R059 Cough, unspecified: Secondary | ICD-10-CM

## 2011-06-04 DIAGNOSIS — J329 Chronic sinusitis, unspecified: Secondary | ICD-10-CM

## 2011-06-23 ENCOUNTER — Emergency Department (HOSPITAL_BASED_OUTPATIENT_CLINIC_OR_DEPARTMENT_OTHER)
Admission: EM | Admit: 2011-06-23 | Discharge: 2011-06-23 | Disposition: A | Discharge status: Home / Self Care | Payer: Medicare Other | Attending: Emergency Medicine | Admitting: Emergency Medicine

## 2011-06-23 ENCOUNTER — Encounter (HOSPITAL_BASED_OUTPATIENT_CLINIC_OR_DEPARTMENT_OTHER): Payer: Self-pay | Admitting: *Deleted

## 2011-06-23 DIAGNOSIS — Z79899 Other long term (current) drug therapy: Secondary | ICD-10-CM | POA: Insufficient documentation

## 2011-06-23 DIAGNOSIS — J45909 Unspecified asthma, uncomplicated: Secondary | ICD-10-CM | POA: Insufficient documentation

## 2011-06-23 DIAGNOSIS — M545 Low back pain, unspecified: Secondary | ICD-10-CM

## 2011-06-23 DIAGNOSIS — I10 Essential (primary) hypertension: Secondary | ICD-10-CM | POA: Insufficient documentation

## 2011-06-23 LAB — URINALYSIS, ROUTINE W REFLEX MICROSCOPIC
Bilirubin Urine: NEGATIVE
Glucose, UA: NEGATIVE mg/dL
Hgb urine dipstick: NEGATIVE
Ketones, ur: NEGATIVE mg/dL
Leukocytes, UA: NEGATIVE
Nitrite: NEGATIVE
Protein, ur: NEGATIVE mg/dL
Specific Gravity, Urine: 1.016 (ref 1.005–1.030)
Urobilinogen, UA: 1 mg/dL (ref 0.0–1.0)
pH: 7.5 (ref 5.0–8.0)

## 2011-06-23 MED ORDER — OXYCODONE-ACETAMINOPHEN 5-325 MG PO TABS
1.0000 | ORAL_TABLET | Freq: Once | ORAL | Status: AC
  Administered 2011-06-23: 1 via ORAL
  Filled 2011-06-23: qty 1

## 2011-06-23 MED ORDER — OXYCODONE-ACETAMINOPHEN 5-325 MG PO TABS: 1.0000 | ORAL_TABLET | ORAL | Status: AC | PRN

## 2011-06-23 MED ORDER — KETOROLAC TROMETHAMINE 60 MG/2ML IM SOLN
60.0000 mg | Freq: Once | INTRAMUSCULAR | Status: AC
  Administered 2011-06-23: 60 mg via INTRAMUSCULAR
  Filled 2011-06-23: qty 2

## 2011-06-23 MED ORDER — METHOCARBAMOL 500 MG PO TABS: 500.0000 mg | ORAL_TABLET | Freq: Two times a day (BID) | ORAL | Status: AC

## 2011-06-23 MED ORDER — METHOCARBAMOL 500 MG PO TABS
1000.0000 mg | ORAL_TABLET | Freq: Once | ORAL | Status: AC
  Administered 2011-06-23: 1000 mg via ORAL
  Filled 2011-06-23: qty 2

## 2011-06-23 NOTE — Discharge Instructions (Signed)
Back Pain, Adult Low back pain is very common. About 1 in 5 people have back pain.The cause of low back pain is rarely dangerous. The pain often gets better over time.About half of people with a sudden onset of back pain feel better in just 2 weeks. About 8 in 10 people feel better by 6 weeks.  CAUSES Some common causes of back pain include:  Strain of the muscles or ligaments supporting the spine.   Wear and tear (degeneration) of the spinal discs.   Arthritis.   Direct injury to the back.  DIAGNOSIS Most of the time, the direct cause of low back pain is not known.However, back pain can be treated effectively even when the exact cause of the pain is unknown.Answering your caregiver's questions about your overall health and symptoms is one of the most accurate ways to make sure the cause of your pain is not dangerous. If your caregiver needs more information, he or she may order lab work or imaging tests (X-rays or MRIs).However, even if imaging tests show changes in your back, this usually does not require surgery. HOME CARE INSTRUCTIONS For many people, back pain returns.Since low back pain is rarely dangerous, it is often a condition that people can learn to manageon their own.   Remain active. It is stressful on the back to sit or stand in one place. Do not sit, drive, or stand in one place for more than 30 minutes at a time. Take short walks on level surfaces as soon as pain allows.Try to increase the length of time you walk each day.   Do not stay in bed.Resting more than 1 or 2 days can delay your recovery.   Do not avoid exercise or work.Your body is made to move.It is not dangerous to be active, even though your back may hurt.Your back will likely heal faster if you return to being active before your pain is gone.   Pay attention to your body when you bend and lift. Many people have less discomfortwhen lifting if they bend their knees, keep the load close to their  bodies,and avoid twisting. Often, the most comfortable positions are those that put less stress on your recovering back.   Find a comfortable position to sleep. Use a firm mattress and lie on your side with your knees slightly bent. If you lie on your back, put a pillow under your knees.   Only take over-the-counter or prescription medicines as directed by your caregiver. Over-the-counter medicines to reduce pain and inflammation are often the most helpful.Your caregiver may prescribe muscle relaxant drugs.These medicines help dull your pain so you can more quickly return to your normal activities and healthy exercise.   Put ice on the injured area.   Put ice in a plastic bag.   Place a towel between your skin and the bag.   Leave the ice on for 15 to 20 minutes, 3 to 4 times a day for the first 2 to 3 days. After that, ice and heat may be alternated to reduce pain and spasms.   Ask your caregiver about trying back exercises and gentle massage. This may be of some benefit.   Avoid feeling anxious or stressed.Stress increases muscle tension and can worsen back pain.It is important to recognize when you are anxious or stressed and learn ways to manage it.Exercise is a great option.  SEEK MEDICAL CARE IF:  You have pain that is not relieved with rest or medicine.   You have   pain that does not improve in 1 week.   You have new symptoms.   You are generally not feeling well.  SEEK IMMEDIATE MEDICAL CARE IF:   You have pain that radiates from your back into your legs.   You develop new bowel or bladder control problems.   You have unusual weakness or numbness in your arms or legs.   You develop nausea or vomiting.   You develop abdominal pain.   You feel faint.  Document Released: 01/21/2005 Document Revised: 01/10/2011 Document Reviewed: 06/11/2010 ExitCare Patient Information 2012 [employer]. 

## 2011-06-23 NOTE — ED Provider Notes (Signed)
History  This chart was scribed for Andrea Racer, MD by Andrea Lowery. The patient was seen in room MH02/MH02. Patient's care was started at 2003.  CSN: [id number]  Arrival date & time 06/23/11  2003   First MD Initiated Contact with Patient 06/23/11 2216      Chief Complaint  Patient presents with  . Back Pain    (Consider location/radiation/quality/duration/timing/severity/associated sxs/prior treatment) HPI  Andrea Lowery is a 71 y.o. female who presents to the Emergency Department complaining of 8 hours of sudden onset, constant, severe back pain localized to the lower back with associated urgency. Pt reports that pain began when she was bending over to get her shoes and has been persistent since. Pt states that pain is worse when moving and is unable to lift her legs or sit up without severe pain. Pt reports that urgency began yesterday. Pt denies weakness, numbness, HA, and dysuria. Pt denies any history of back pain.    Past Medical History  Diagnosis Date  . Asthma   . Hypertension     Past Surgical History  Procedure Date  . Cholecystectomy   . Tonsillectomy   . Abdominal hysterectomy   . Plantar fascia release     History reviewed. No pertinent family history.  History  Substance Use Topics  . Smoking status: Former Smoker    Quit date: 02/04/2006  . Smokeless tobacco: Not on file  . Alcohol Use: No    OB History    Grav Para Term Preterm Abortions TAB SAB Ect Mult Living                  Review of Systems  Constitutional: Negative for fever and chills.  HENT: Negative for ear pain and neck pain.   Respiratory: Negative for cough and shortness of breath.   Cardiovascular: Negative for chest pain.  Gastrointestinal: Negative for nausea, vomiting and abdominal pain.  Genitourinary: Positive for urgency. Negative for dysuria and hematuria.  Musculoskeletal: Positive for back pain. Negative for joint swelling.  Skin: Negative for rash.  All  other systems reviewed and are negative.    Allergies  Penicillins  Home Medications   Current Outpatient Rx  Name Route Sig Dispense Refill  . ALBUTEROL SULFATE HFA 108 (90 BASE) MCG/ACT IN AERS Inhalation Inhale 2 puffs into the lungs every 4 (four) hours as needed. For shortness of breath and wheezing    . BECLOMETHASONE DIPROPIONATE 80 MCG/ACT IN AERS Inhalation Inhale 2 puffs into the lungs 2 (two) times daily.    Marland Kitchen CITALOPRAM HYDROBROMIDE 10 MG PO TABS Oral Take 10 mg by mouth daily.    Marland Kitchen ESOMEPRAZOLE MAGNESIUM 40 MG PO CPDR Oral Take 40 mg by mouth daily before breakfast.      . EZETIMIBE-SIMVASTATIN 10-40 MG PO TABS Oral Take 1 tablet by mouth daily.     Marland Kitchen FLUTICASONE PROPIONATE 50 MCG/ACT NA SUSP Nasal Place 2 sprays into the nose daily.    Marland Kitchen LEVOCETIRIZINE DIHYDROCHLORIDE 5 MG PO TABS Oral Take 5 mg by mouth every evening.    Marland Kitchen LEVOTHYROXINE SODIUM 75 MCG PO TABS Oral Take 75 mcg by mouth daily.      Marland Kitchen MONTELUKAST SODIUM 10 MG PO TABS Oral Take 10 mg by mouth at bedtime.    . OMEGA-3-ACID ETHYL ESTERS 1 G PO CAPS Oral Take 2 g by mouth 2 (two) times daily.     . TOPIRAMATE 100 MG PO TABS Oral Take 100 mg by mouth daily.     Marland Kitchen  VALSARTAN-HYDROCHLOROTHIAZIDE 320-25 MG PO TABS Oral Take 1 tablet by mouth daily.    Marland Kitchen EPINEPHRINE 0.3 MG/0.3ML IJ DEVI Intramuscular Inject 0.3 mg into the muscle as needed. For severe allergic reaction    . METHOCARBAMOL 500 MG PO TABS Oral Take 1 tablet (500 mg total) by mouth 2 (two) times daily. 20 tablet 0  . OXYCODONE-ACETAMINOPHEN 5-325 MG PO TABS Oral Take 1 tablet by mouth every 4 (four) hours as needed for pain. 15 tablet 0    BP 124/65  Pulse 67  Temp(Src) 98.1 F (36.7 C) (Oral)  Resp 20  SpO2 99%  Physical Exam  Nursing note and vitals reviewed. Constitutional: She is oriented to person, place, and time. She appears well-developed and well-nourished.  HENT:  Head: Normocephalic and atraumatic.  Eyes: Conjunctivae are normal. No  scleral icterus.  Neck: Normal range of motion. Neck supple.  Cardiovascular: Normal rate and regular rhythm.  Exam reveals no gallop and no friction rub.   No murmur heard. Pulmonary/Chest: Effort normal and breath sounds normal. No respiratory distress. She has no wheezes. She has no rales. She exhibits no tenderness.  Abdominal: Soft. She exhibits no mass. There is no tenderness. There is no rebound and no guarding.  Musculoskeletal: Normal range of motion. She exhibits tenderness (TTP in band-like pattern over lower back. No mid line TTP). She exhibits no edema.  Neurological: She is alert and oriented to person, place, and time.       5/5 motor in all ext. No sensory loss  Skin: Skin is warm and dry.  Psychiatric: She has a normal mood and affect. Her behavior is normal.    ED Course  Procedures (including critical care time)  DIAGNOSTIC STUDIES: Oxygen Saturation is 99% on room air, normal by my interpretation.    COORDINATION OF CARE: 10:27PM - will get pain medication in order to complete exam.     Labs Reviewed  URINALYSIS, ROUTINE W REFLEX MICROSCOPIC   No results found.   1. Low back pain       MDM  I personally performed the services described in this documentation, which was scribed in my presence. The recorded information has been reviewed and considered.           Andrea Racer, MD 06/23/11 [phone]

## 2011-06-23 NOTE — ED Notes (Signed)
Pt with sudden onset of low back pain that began 30 min PTA pt denies injury or strenuous activity denies urinary sx

## 2011-06-23 NOTE — ED Notes (Signed)
Pt assisted with ambulation to bedside commode.

## 2011-06-23 NOTE — ED Notes (Signed)
Pt c/o lower lumbar back pain as sharp, stabbing pain. Worse with movement and with radiation down both legs.Pt denies any recent trauma, falls, or heavy lifting.  Neuro assessment is benign.

## 2011-06-25 ENCOUNTER — Emergency Department (HOSPITAL_COMMUNITY): Payer: Medicare Other

## 2011-06-25 ENCOUNTER — Encounter (HOSPITAL_COMMUNITY): Payer: Self-pay | Admitting: *Deleted

## 2011-06-25 ENCOUNTER — Emergency Department (HOSPITAL_COMMUNITY)
Admission: EM | Admit: 2011-06-25 | Discharge: 2011-06-26 | Disposition: A | Discharge status: Home / Self Care | Payer: Medicare Other | Attending: Emergency Medicine | Admitting: Emergency Medicine

## 2011-06-25 DIAGNOSIS — M5380 Other specified dorsopathies, site unspecified: Secondary | ICD-10-CM | POA: Insufficient documentation

## 2011-06-25 DIAGNOSIS — M545 Low back pain, unspecified: Secondary | ICD-10-CM | POA: Insufficient documentation

## 2011-06-25 DIAGNOSIS — Z87891 Personal history of nicotine dependence: Secondary | ICD-10-CM | POA: Insufficient documentation

## 2011-06-25 DIAGNOSIS — M62830 Muscle spasm of back: Secondary | ICD-10-CM

## 2011-06-25 DIAGNOSIS — J45909 Unspecified asthma, uncomplicated: Secondary | ICD-10-CM | POA: Insufficient documentation

## 2011-06-25 DIAGNOSIS — I10 Essential (primary) hypertension: Secondary | ICD-10-CM | POA: Insufficient documentation

## 2011-06-25 MED ORDER — LIDOCAINE 5 % EX PTCH
1.0000 | MEDICATED_PATCH | CUTANEOUS | Status: DC
  Administered 2011-06-25: 1 via TRANSDERMAL
  Filled 2011-06-25: qty 1

## 2011-06-25 MED ORDER — KETOROLAC TROMETHAMINE 30 MG/ML IJ SOLN
30.0000 mg | Freq: Once | INTRAMUSCULAR | Status: AC
  Administered 2011-06-25: 30 mg via INTRAVENOUS
  Filled 2011-06-25: qty 1

## 2011-06-25 MED ORDER — DIAZEPAM 5 MG/ML IJ SOLN
5.0000 mg | Freq: Once | INTRAMUSCULAR | Status: AC
  Administered 2011-06-25: 5 mg via INTRAVENOUS
  Filled 2011-06-25: qty 2

## 2011-06-25 NOTE — ED Provider Notes (Signed)
History     CSN: [id number]  Arrival date & time 06/25/11  1816   First MD Initiated Contact with Patient 06/25/11 2145      Chief Complaint  Patient presents with  . Back Pain    (Consider location/radiation/quality/duration/timing/severity/associated sxs/prior treatment) HPI Comments: Patient states that when she was getting dressed for church on Sunday.  She suddenly had a sharp, low back pain, radiating bilaterally, but worse on the right and left buttock area.  She was seen in the emergency room that evening and given Percocet and Robaxin, which she, states she's been taking without any.  She denies any dysuria, nausea, vomiting, diarrhea, constipation history of previous back pain.  She has not fallen or been in a car accident  The history is provided by the patient.    Past Medical History  Diagnosis Date  . Asthma   . Hypertension     Past Surgical History  Procedure Date  . Cholecystectomy   . Tonsillectomy   . Abdominal hysterectomy   . Plantar fascia release     History reviewed. No pertinent family history.  History  Substance Use Topics  . Smoking status: Former Smoker    Quit date: 02/04/2006  . Smokeless tobacco: Not on file  . Alcohol Use: No    OB History    Grav Para Term Preterm Abortions TAB SAB Ect Mult Living                  Review of Systems  Constitutional: Negative for fever.  HENT: Negative.   Respiratory: Negative for shortness of breath.   Cardiovascular: Negative for chest pain.  Genitourinary: Negative for dysuria, frequency, flank pain and decreased urine volume.  Musculoskeletal: Positive for back pain.  Neurological: Negative for weakness and numbness.    Allergies  Penicillins  Home Medications   Current Outpatient Rx  Name Route Sig Dispense Refill  . ALBUTEROL SULFATE HFA 108 (90 BASE) MCG/ACT IN AERS Inhalation Inhale 2 puffs into the lungs every 4 (four) hours as needed. For shortness of breath and wheezing      . BECLOMETHASONE DIPROPIONATE 80 MCG/ACT IN AERS Inhalation Inhale 2 puffs into the lungs 2 (two) times daily.    Marland Kitchen ESOMEPRAZOLE MAGNESIUM 40 MG PO CPDR Oral Take 40 mg by mouth daily before breakfast.      . EZETIMIBE-SIMVASTATIN 10-40 MG PO TABS Oral Take 1 tablet by mouth daily.     Marland Kitchen FLUTICASONE PROPIONATE 50 MCG/ACT NA SUSP Nasal Place 2 sprays into the nose daily.    Marland Kitchen LEVOCETIRIZINE DIHYDROCHLORIDE 5 MG PO TABS Oral Take 5 mg by mouth every evening.    Marland Kitchen LEVOTHYROXINE SODIUM 75 MCG PO TABS Oral Take 75 mcg by mouth daily.      Marland Kitchen METHOCARBAMOL 500 MG PO TABS Oral Take 1 tablet (500 mg total) by mouth 2 (two) times daily. 20 tablet 0  . MONTELUKAST SODIUM 10 MG PO TABS Oral Take 10 mg by mouth at bedtime.    . OMEGA-3-ACID ETHYL ESTERS 1 G PO CAPS Oral Take 2 g by mouth 2 (two) times daily.     . OXYCODONE-ACETAMINOPHEN 5-325 MG PO TABS Oral Take 1 tablet by mouth every 4 (four) hours as needed for pain. 15 tablet 0  . TOPIRAMATE 100 MG PO TABS Oral Take 100 mg by mouth daily.     Marland Kitchen VALSARTAN-HYDROCHLOROTHIAZIDE 320-25 MG PO TABS Oral Take 1 tablet by mouth daily.    Marland Kitchen DIAZEPAM 5 MG  PO TABS Oral Take 0.5 tablets (2.5 mg total) by mouth every 6 (six) hours as needed for anxiety (for spasm ). 15 tablet 0  . LIDOCAINE 5 % EX PTCH Transdermal Place 1 patch onto the skin daily. Remove & Discard patch within 12 hours or as directed by MD 30 patch 0    BP 141/72  Pulse 67  Temp(Src) 98.8 F (37.1 C) (Oral)  Resp 20  SpO2 99%  Physical Exam  Constitutional: She is oriented to person, place, and time. She appears well-developed and well-nourished.  HENT:  Head: Normocephalic.  Eyes: Pupils are equal, round, and reactive to light.  Neck: Normal range of motion.  Cardiovascular: Normal rate.   Pulmonary/Chest: Effort normal.  Abdominal: Soft. She exhibits no distension. There is no tenderness.  Musculoskeletal: Normal range of motion. She exhibits no edema.       Back:       The  patient rolled onto her left side she had severe.  muscle spasm of the lumbar region, more on the right than left, radiating down into the right buttock and posterior right thigh.  Neurological: She is alert and oriented to person, place, and time.  Skin: Skin is warm. No rash noted. No pallor.    ED Course  Procedures (including critical care time)  Labs Reviewed - No data to display Dg Lumbar Spine Complete  06/26/2011  *RADIOLOGY REPORT*  Clinical Data: Low back pain which has progressed over the past 2 days.  No known injury.  LUMBAR SPINE - COMPLETE 4+ VIEW  Comparison: None.  Findings: Five non-rib bearing lumbar vertebrae with anatomic alignment.  No fractures.  Straightening of the usual lordosis. Well-preserved disc spaces.  Mild spondylosis involving the endplates adjacent to the L3-4 and L4-5 discs.  No pars defects. No significant facet arthropathy.  Visualized sacroiliac joints intact.  Aortic atherosclerosis without aneurysm.  IMPRESSION: Straightening of the usual lordosis which may reflect positioning and/or spasm.  No acute or significant abnormalities otherwise.  Original Report Authenticated By: Arnell Sieving, M.D.     1. Back muscle spasm     Patient reports some relief from her discomfort.  She is able to ambulate to the bathroom without assistance  MDM  Since his the second visit for him back pain.  Will image lumbar area, even though there is no history of fall or trauma.  Just to verify that there is not a small compression fracture.  Do, to this patient's age and body habitus.  I will treat for muscle spasm with IV Toradol IV, Valium, lidocaine patch to the area, as well as heat        Arman Filter, NP 06/26/11 0144

## 2011-06-25 NOTE — ED Notes (Signed)
Pt given coke a cola and 2 packs of saltine crackers.

## 2011-06-25 NOTE — ED Notes (Addendum)
Pt in c/o lower back pain since Sunday, states she went to the hospital on Sunday and was treated for the pain but states it didn't help and her symptoms have remained, denies history of back pain in the past, pain is unchanged with movement

## 2011-06-25 NOTE — ED Notes (Signed)
PA Schulz at bedside. 

## 2011-06-25 NOTE — ED Notes (Signed)
Patient transported to X-ray 

## 2011-06-26 MED ORDER — DIAZEPAM 5 MG PO TABS: 2.5000 mg | ORAL_TABLET | Freq: Four times a day (QID) | ORAL | Status: AC | PRN

## 2011-06-26 MED ORDER — LIDOCAINE 5 % EX PTCH: 1.0000 | MEDICATED_PATCH | CUTANEOUS | Status: AC

## 2011-06-26 NOTE — ED Notes (Signed)
MD at bedside.  Dr. Otter at bedside 

## 2011-06-26 NOTE — ED Notes (Signed)
Pt states that her back feels better. States it still hurts when she tries to stand, but once she is upright she is able to ambulate without too much pain.

## 2011-06-26 NOTE — ED Notes (Signed)
PA Schulz at bedside. 

## 2011-06-27 NOTE — ED Provider Notes (Signed)
Medical screening examination/treatment/procedure(s) were performed by non-physician practitioner and as supervising physician I was immediately available for consultation/collaboration.  Flint Melter, MD 06/27/11 1534

## 2011-10-08 ENCOUNTER — Other Ambulatory Visit: Payer: Self-pay | Admitting: Internal Medicine

## 2011-10-08 DIAGNOSIS — Z1231 Encounter for screening mammogram for malignant neoplasm of breast: Secondary | ICD-10-CM

## 2011-11-18 ENCOUNTER — Ambulatory Visit
Admission: RE | Admit: 2011-11-18 | Discharge: 2011-11-18 | Disposition: A | Discharge status: Home / Self Care | Payer: Medicare Other | Source: Ambulatory Visit | Attending: Internal Medicine | Admitting: Internal Medicine

## 2011-11-18 DIAGNOSIS — Z1231 Encounter for screening mammogram for malignant neoplasm of breast: Secondary | ICD-10-CM

## 2012-03-01 ENCOUNTER — Emergency Department (HOSPITAL_BASED_OUTPATIENT_CLINIC_OR_DEPARTMENT_OTHER)
Admission: EM | Admit: 2012-03-01 | Discharge: 2012-03-01 | Disposition: A | Discharge status: Home / Self Care | Payer: Medicare Other | Attending: Emergency Medicine | Admitting: Emergency Medicine

## 2012-03-01 ENCOUNTER — Encounter (HOSPITAL_BASED_OUTPATIENT_CLINIC_OR_DEPARTMENT_OTHER): Payer: Self-pay | Admitting: *Deleted

## 2012-03-01 DIAGNOSIS — R059 Cough, unspecified: Secondary | ICD-10-CM | POA: Insufficient documentation

## 2012-03-01 DIAGNOSIS — R5383 Other fatigue: Secondary | ICD-10-CM | POA: Insufficient documentation

## 2012-03-01 DIAGNOSIS — R509 Fever, unspecified: Secondary | ICD-10-CM | POA: Insufficient documentation

## 2012-03-01 DIAGNOSIS — R05 Cough: Secondary | ICD-10-CM | POA: Insufficient documentation

## 2012-03-01 DIAGNOSIS — Z87891 Personal history of nicotine dependence: Secondary | ICD-10-CM | POA: Insufficient documentation

## 2012-03-01 DIAGNOSIS — R6889 Other general symptoms and signs: Secondary | ICD-10-CM

## 2012-03-01 DIAGNOSIS — R5381 Other malaise: Secondary | ICD-10-CM | POA: Insufficient documentation

## 2012-03-01 DIAGNOSIS — I10 Essential (primary) hypertension: Secondary | ICD-10-CM | POA: Insufficient documentation

## 2012-03-01 DIAGNOSIS — Z79899 Other long term (current) drug therapy: Secondary | ICD-10-CM | POA: Insufficient documentation

## 2012-03-01 DIAGNOSIS — IMO0001 Reserved for inherently not codable concepts without codable children: Secondary | ICD-10-CM | POA: Insufficient documentation

## 2012-03-01 DIAGNOSIS — J45909 Unspecified asthma, uncomplicated: Secondary | ICD-10-CM | POA: Insufficient documentation

## 2012-03-01 MED ORDER — HYDROCODONE-HOMATROPINE 5-1.5 MG/5ML PO SYRP: 5.0000 mL | ORAL_SOLUTION | Freq: Four times a day (QID) | ORAL | Status: DC | PRN

## 2012-03-01 MED ORDER — FLUTICASONE PROPIONATE 50 MCG/ACT NA SUSP: 2.0000 | Freq: Every day | NASAL | Status: DC

## 2012-03-01 NOTE — ED Notes (Signed)
Fever, cough, body aches since Friday.  

## 2012-03-01 NOTE — ED Provider Notes (Signed)
Medical screening examination/treatment/procedure(s) were performed by non-physician practitioner and as supervising physician I was immediately available for consultation/collaboration.   Melanie Belfi, MD 03/01/12 2006 

## 2012-03-01 NOTE — Discharge Instructions (Signed)
Take medications as prescribed. Followup with your doctor in regards to your hospital visit. If you do not have a doctor use the resource guide listed below to help he find one. You may return to the emergency department if symptoms worsen, become progressive, or become more concerning. Read below to learn more about your diagnosis & reasons to return. Use Tylenol to treat your fever  ° °Influenza, Adult  °Influenza (flu)  starts suddenly, usually with a fever. It causes chills, dry and hacking cough, headache, body aches, and sore throat. Influenza spreads easily from one person to another.  °HOME CARE  °Only take medicines as told by your doctor.  °Rest.  °Drink enough fluids to keep your pee (urine) clear or pale yellow.  °Wash your hands often. Do this after you blow your nose, after you go to the bathroom, and before you touch food.  °GET HELP RIGHT AWAY IF:  °You have shortness of breath while resting.  °You have pain or pressure in the chest or belly (abdomen).  °You suddenly feel dizzy.  °You feel confused.  °You have a hard time breathing.  °Your skin or nails turn bluish in color.  °You get a bad neck pain or stiffness.  °You get a bad headache, face pain, or earache.  °You throw up (vomit) a lot and often.  °You have a fever > 101 that persists  °MAKE SURE YOU:  °Understand these instructions.  °Will watch your condition.  °Will get help right away if you are not doing well or get worse.  ° ° °RESOURCE GUIDE ° °Dental Problems ° °Patients with Medicaid: °[facility]                     [facility] °[address]                                           [address] °Phone:  [phone]                                                  Phone:  510-2600 ° °If unable to pay or uninsured, contact:  [facility] or [facility] to become qualified for the adult dental clinic. ° °Chronic Pain Problems °Contact [facility]  [phone] °Patients  need to be referred by their primary care doctor. ° °Insufficient Money for Medicine °Contact [employer]:  call "211" or [facility] 271-5999. ° °No Primary Care Doctor °Call [facility]  832-8000 °Other agencies that provide inexpensive medical care °   [facility]  [phone] °   [facility]  [phone] °   [facility]  [phone] °   [facility]  [phone] °   [facility]  [phone] °   [facility]  [phone] ° °Psychological Services °[facility]  [phone] °[facility]  [phone] °[facility]   [phone] (emergency services [phone]) ° °Substance Abuse Resources °[facility]  336-882-2125 °[facility] 336-784-9470 °[facility] 336-285-9073 °[facility] 336-845-3988 °Residential & Outpatient Substance Abuse Program  800-659-3381 ° °Abuse/Neglect °[place] [facility] (336) 641-3795 °[place]   Child Abuse Hotline 800-378-5315 (After Hours) ° °Emergency Shelter °[facility] (336) 271-5985 ° °Maternity Homes °[facility] (336) 275-9566 °[facility] (704) 372-4663 ° °MRSA Hotline #:   832-7006 ° ° ° °[place] Resources ° °[facility] [place] [facility]     [employer]                          [facility] °[address] [place]                       [address]      [address]  °[place]                                                [place]                            [place] °Phone:  349-3220                                   Phone:  [phone]                 Phone:  342-8140 ° °[facility] °Phone:  342-8316 ° °[place] Child Abuse Hotline °(336) 342-1394 °(336) 342-3537 (After Hours) ° ° °

## 2012-03-01 NOTE — ED Provider Notes (Signed)
History     CSN: [id number]  Arrival date & time 03/01/12  1521   First MD Initiated Contact with Patient 03/01/12 1757      Chief Complaint  Patient presents with  . Cough    (Consider location/radiation/quality/duration/timing/severity/associated sxs/prior treatment) HPI Comments: Andrea Lowery is a 72 y.o. female with a history of asthma and hypertension presents emergency department complaining of flulike symptoms.  Patient states that her granddaughter currently has the flu and she began having low-grade fevers cough and bodyaches since Friday.  Patient reports that she did get a flu shot.  She denies any chest pain shortness of breath or difficulty breathing.  Patient is a 72 y.o. female presenting with cough. The history is provided by the patient.  Cough Associated symptoms include chills and myalgias. Pertinent negatives include no chest pain, no ear pain, no rhinorrhea and no sore throat.    Past Medical History  Diagnosis Date  . Asthma   . Hypertension     Past Surgical History  Procedure Date  . Cholecystectomy   . Tonsillectomy   . Abdominal hysterectomy   . Plantar fascia release     History reviewed. No pertinent family history.  History  Substance Use Topics  . Smoking status: Former Smoker    Quit date: 02/04/2006  . Smokeless tobacco: Not on file  . Alcohol Use: No    OB History    Grav Para Term Preterm Abortions TAB SAB Ect Mult Living                  Review of Systems  Constitutional: Positive for fever, chills and fatigue.  HENT: Negative for ear pain, congestion, sore throat, rhinorrhea, sneezing, neck pain, neck stiffness, sinus pressure and tinnitus.   Eyes: Negative for visual disturbance.  Respiratory: Positive for cough. Negative for chest tightness.   Cardiovascular: Negative for chest pain and palpitations.  Gastrointestinal: Negative for nausea, vomiting, abdominal pain and diarrhea.  Genitourinary: Negative for  dysuria.  Musculoskeletal: Positive for myalgias.  Skin: Negative for color change and rash.  Neurological: Negative for dizziness and weakness.  Hematological: Does not bruise/bleed easily.  Psychiatric/Behavioral: Negative for confusion.  All other systems reviewed and are negative.    Allergies  Penicillins  Home Medications   Current Outpatient Rx  Name  Route  Sig  Dispense  Refill  . ALBUTEROL SULFATE HFA 108 (90 BASE) MCG/ACT IN AERS   Inhalation   Inhale 2 puffs into the lungs every 4 (four) hours as needed. For shortness of breath and wheezing         . BECLOMETHASONE DIPROPIONATE 80 MCG/ACT IN AERS   Inhalation   Inhale 2 puffs into the lungs 2 (two) times daily.         Marland Kitchen ESOMEPRAZOLE MAGNESIUM 40 MG PO CPDR   Oral   Take 40 mg by mouth daily before breakfast.           . EZETIMIBE-SIMVASTATIN 10-40 MG PO TABS   Oral   Take 1 tablet by mouth daily.          Marland Kitchen FLUTICASONE PROPIONATE 50 MCG/ACT NA SUSP   Nasal   Place 2 sprays into the nose daily.         Marland Kitchen FLUTICASONE PROPIONATE 50 MCG/ACT NA SUSP   Nasal   Place 2 sprays into the nose daily.   16 g   2   . HYDROCODONE-HOMATROPINE 5-1.5 MG/5ML PO SYRP   Oral  Take 5 mLs by mouth every 6 (six) hours as needed for cough.   120 mL   0   . LEVOCETIRIZINE DIHYDROCHLORIDE 5 MG PO TABS   Oral   Take 5 mg by mouth every evening.         Marland Kitchen LEVOTHYROXINE SODIUM 75 MCG PO TABS   Oral   Take 75 mcg by mouth daily.           Marland Kitchen MONTELUKAST SODIUM 10 MG PO TABS   Oral   Take 10 mg by mouth at bedtime.         . OMEGA-3-ACID ETHYL ESTERS 1 G PO CAPS   Oral   Take 2 g by mouth 2 (two) times daily.          . TOPIRAMATE 100 MG PO TABS   Oral   Take 100 mg by mouth daily.          Marland Kitchen VALSARTAN-HYDROCHLOROTHIAZIDE 320-25 MG PO TABS   Oral   Take 1 tablet by mouth daily.           BP 147/71  Pulse 72  Temp 98.1 F (36.7 C) (Oral)  Resp 20  Ht 5\' 8"  (1.727 m)  Wt 202 lb 1 oz  (91.655 kg)  BMI 30.72 kg/m2  SpO2 98%  Physical Exam  Nursing note and vitals reviewed. Constitutional: She is oriented to person, place, and time. She appears well-developed and well-nourished. No distress.  HENT:  Head: Normocephalic and atraumatic.       Nasal congestion & rhinorrhea.  No ttp over frontal & maxilla sinuses. Normal L & R external ear canals and TMs. No tragal or mastoid tenderness. Oropharynx moist, without tonsillar exudate.   Eyes: Pupils are equal, round, and reactive to light.       No pain w EOM. Conjunctiva normal    Neck: Normal range of motion.       Soft, no nuchal rigidity. No lymphadenopathy  Cardiovascular: Normal heart sounds and intact distal pulses.        RRR, no aberrancy on auscultation  Pulmonary/Chest: Effort normal.       LCAB, no respiratory distress  Musculoskeletal: Normal range of motion.  Neurological: She is alert and oriented to person, place, and time.  Skin: She is not diaphoretic.       No rash  Psychiatric: Her behavior is normal.    ED Course  Procedures (including critical care time)  Labs Reviewed - No data to display No results found.   1. Flu-like symptoms      BP 147/71  Pulse 72  Temp 98.1 F (36.7 C) (Oral)  Resp 20  Ht 5\' 8"  (1.727 m)  Wt 202 lb 1 oz (91.655 kg)  BMI 30.72 kg/m2  SpO2 98%  MDM  Flulike symptoms MDM Number of Diagnoses or Management Options Flu-like symptoms:  Patient with symptoms consistent with influenza.  Vitals are stable, low-grade fever.  No signs of dehydration, tolerating PO's.  Lungs are clear. Due to patient's presentation and physical exam a chest x-ray was not ordered bc likely diagnosis of flu.  Discussed the cost versus benefit of Tamiflu treatment with the patient.  The patient understands that symptoms are greater than the recommended 24-48 hour window of treatment.  Patient will be discharged with instructions to orally hydrate, rest, and use over-the-counter medications  such as anti-inflammatories ibuprofen and Aleve for muscle aches and Tylenol for fever.  Patient will also be given a cough suppressant.  Jaci Carrel, [place] 03/01/12 1835

## 2012-05-04 ENCOUNTER — Other Ambulatory Visit: Payer: Self-pay | Admitting: Nurse Practitioner

## 2012-05-04 ENCOUNTER — Ambulatory Visit
Admission: RE | Admit: 2012-05-04 | Discharge: 2012-05-04 | Disposition: A | Discharge status: Home / Self Care | Payer: Medicare Other | Source: Ambulatory Visit | Attending: Nurse Practitioner | Admitting: Nurse Practitioner

## 2012-05-04 DIAGNOSIS — M79662 Pain in left lower leg: Secondary | ICD-10-CM

## 2012-06-30 ENCOUNTER — Encounter: Payer: Self-pay | Admitting: Internal Medicine

## 2012-06-30 ENCOUNTER — Encounter: Payer: Self-pay | Admitting: *Deleted

## 2012-07-01 ENCOUNTER — Ambulatory Visit (INDEPENDENT_AMBULATORY_CARE_PROVIDER_SITE_OTHER): Payer: Medicare Other | Admitting: Internal Medicine

## 2012-07-01 ENCOUNTER — Encounter: Payer: Self-pay | Admitting: Internal Medicine

## 2012-07-01 VITALS — BP 130/70 | HR 77 | Ht 67.0 in | Wt 193.5 lb

## 2012-07-01 DIAGNOSIS — G4733 Obstructive sleep apnea (adult) (pediatric): Secondary | ICD-10-CM

## 2012-07-01 DIAGNOSIS — I447 Left bundle-branch block, unspecified: Secondary | ICD-10-CM | POA: Insufficient documentation

## 2012-07-01 DIAGNOSIS — I10 Essential (primary) hypertension: Secondary | ICD-10-CM

## 2012-07-01 DIAGNOSIS — R0600 Dyspnea, unspecified: Secondary | ICD-10-CM | POA: Insufficient documentation

## 2012-07-01 DIAGNOSIS — R0609 Other forms of dyspnea: Secondary | ICD-10-CM

## 2012-07-01 DIAGNOSIS — Z9989 Dependence on other enabling machines and devices: Secondary | ICD-10-CM | POA: Insufficient documentation

## 2012-07-01 DIAGNOSIS — R0989 Other specified symptoms and signs involving the circulatory and respiratory systems: Secondary | ICD-10-CM

## 2012-07-01 DIAGNOSIS — R069 Unspecified abnormalities of breathing: Secondary | ICD-10-CM | POA: Insufficient documentation

## 2012-07-01 HISTORY — DX: Other forms of dyspnea: R06.09

## 2012-07-01 NOTE — Progress Notes (Signed)
OFFICE NOTE  Chief Complaint:  Routine office visit  Primary Care Physician: Alva Garnet., MD  HPI:  Andrea Lowery is a 72 year old female, history of some lower extremity edema, asthma, seasonal allergies, and a left bundle branch block. There was no evidence for ischemia based on stress testing in 2012. The main issue today is she was told recently that she was having worsening problems with snoring and witnessed episodes of apnea at night. She says her sleep is poor. She often feels fatigued throughout the day. Occasionally wakes up with headaches in the morning. Apparently, she underwent a sleep study in 2008 or so and that was negative; however, her weight was about 150 to 160 at the time. Since she has stopped smoking and gained about 40 pounds. I was concerned about sleep apnea and sent her for a sleep study which demonstrated significant sleep apnea. She was fitted with a mask and is doing well.  I have also readjusted her medications, switching her to Diovan HCTZ and decreasing her amlodipine, which has since been discontinued totally. She also recently underwent extensive dental work by Dr. Jeanice Lim.  This included moderate sedation and was without complication.  She returns today feeling fairly well. EKG continues to show persistent left bundle branch block.  PMHx:  Past Medical History  Diagnosis Date  . Asthma   . Hypertension   . OSA on CPAP   . Dyslipidemia   . Hypothyroidism   . LBBB (left bundle branch block)     Past Surgical History  Procedure Laterality Date  . Cholecystectomy  1976  . Tonsillectomy  1965  . Abdominal hysterectomy  1969  . Plantar fascia release    . US echocardiography  09/20/2008    borderline LVH,mild TR,AOV mildly sclerotic w/ca+ of the leaflets  . Nm myoview ltd  02/13/2010    No ischemia    FAMHx:  No family history on file.  SOCHx:   reports that she quit smoking about 6 years ago. She does not have any smokeless tobacco  history on file. She reports that she does not drink alcohol or use illicit drugs.  ALLERGIES:  Allergies  Allergen Reactions  . Penicillins Hives   ROS: A comprehensive review of systems was negative except for: Constitutional: positive for fatigue Cardiovascular: positive for dyspnea  HOME MEDS: Current Outpatient Prescriptions  Medication Sig Dispense Refill  . albuterol (PROVENTIL HFA;VENTOLIN HFA) 108 (90 BASE) MCG/ACT inhaler Inhale 2 puffs into the lungs every 4 (four) hours as needed. For shortness of breath and wheezing      . beclomethasone (QVAR) 80 MCG/ACT inhaler Inhale 2 puffs into the lungs 2 (two) times daily.      . cycloSPORINE (RESTASIS) 0.05 % ophthalmic emulsion 1 drop 2 (two) times daily.      Marland Kitchen esomeprazole (NEXIUM) 40 MG capsule Take 40 mg by mouth daily before breakfast.        . ezetimibe-simvastatin (VYTORIN) 10-40 MG per tablet Take 1 tablet by mouth daily.       . fluticasone (FLONASE) 50 MCG/ACT nasal spray Place 2 sprays into the nose daily.  16 g  2  . furosemide (LASIX) 20 MG tablet Take 20 mg by mouth 2 (two) times daily.      Marland Kitchen levocetirizine (XYZAL) 5 MG tablet Take 5 mg by mouth every evening.      Marland Kitchen levothyroxine (SYNTHROID, LEVOTHROID) 75 MCG tablet Take 75 mcg by mouth daily.        Marland Kitchen  montelukast (SINGULAIR) 10 MG tablet Take 10 mg by mouth at bedtime.      . Potassium 95 MG TBCR Take by mouth.      . topiramate (TOPAMAX) 50 MG tablet Take 50 mg by mouth 2 (two) times daily as needed.      . valsartan-hydrochlorothiazide (DIOVAN-HCT) 320-25 MG per tablet Take 1 tablet by mouth daily.      . Vitamin D, Ergocalciferol, (DRISDOL) 50000 UNITS CAPS Take 50,000 Units by mouth every 7 (seven) days.       No current facility-administered medications for this visit.    LABS/IMAGING: No results found for this or any previous visit (from the past 48 hour(s)). No results found.  VITALS: BP 130/70  Pulse 77  Ht 5\' 7"  (1.702 m)  Wt 193 lb 8 oz (87.771  kg)  BMI 30.3 kg/m2  EXAM: General appearance: alert and no distress Neck: no adenopathy, no carotid bruit, no JVD, supple, symmetrical, trachea midline and thyroid not enlarged, symmetric, no tenderness/mass/nodules Lungs: clear to auscultation bilaterally Heart: regular rate and rhythm, S1, S2 normal and systolic murmur: early systolic 2/6, crescendo at 2nd left intercostal space Abdomen: soft, non-tender; bowel sounds normal; no masses,  no organomegaly Extremities: extremities normal, atraumatic, no cyanosis or edema Pulses: 2+ and symmetric Skin: Skin color, texture, turgor normal. No rashes or lesions Neurologic: Grossly normal  EKG: Normal sinus rhythm at 77 with left bundle branch block  ASSESSMENT: 1. Left bundle branch block 2. Hypertension-controlled 3. Mild dyspnea on exertion 4. Obstructive sleep apnea on CPAP  PLAN: 1.   Andrea Lowery continues to do well on CPAP, but has complaints of shortness of breath with moderate exertion. She recently has had weight loss due to numerous dental procedures and has found it difficult to eat. Her EKG is stable with a persistent left bundle branch block, and there are no signs concerning for active angina. I continued to encourage her to exercise, eat healthy and lose weight. Plan to see her back in the office annually or sooner as necessary.  Chrystie Nose, MD, [facility] Attending Cardiologist [facility]  HILTY,Kenneth C 07/01/2012, 5:19 PM

## 2012-07-01 NOTE — Patient Instructions (Addendum)
Your physician wants you to follow-up in: 12 months.  You will receive a reminder letter in the mail two months in advance. If you don't receive a letter, please call our office to schedule the follow-up appointment.  Your physician recommends that you continue on your current medications as directed. Please refer to the Current Medication list given to you today.   

## 2012-07-18 ENCOUNTER — Emergency Department (INDEPENDENT_AMBULATORY_CARE_PROVIDER_SITE_OTHER)
Admission: EM | Admit: 2012-07-18 | Discharge: 2012-07-18 | Disposition: A | Discharge status: Home / Self Care | Payer: Medicare Other | Source: Home / Self Care

## 2012-07-18 ENCOUNTER — Encounter (HOSPITAL_COMMUNITY): Payer: Self-pay | Admitting: Emergency Medicine

## 2012-07-18 DIAGNOSIS — J309 Allergic rhinitis, unspecified: Secondary | ICD-10-CM

## 2012-07-18 MED ORDER — FLUTICASONE PROPIONATE 50 MCG/ACT NA SUSP: 2.0000 | Freq: Every day | NASAL | Status: DC

## 2012-07-18 MED ORDER — AZELASTINE HCL 0.1 % NA SOLN: 2.0000 | Freq: Two times a day (BID) | NASAL | Status: DC

## 2012-07-18 NOTE — Discharge Instructions (Signed)
Allergic Rhinitis Allergic rhinitis is when the mucous membranes in the nose respond to allergens. Allergens are particles in the air that cause your body to have an allergic reaction. This causes you to release allergic antibodies. Through a chain of events, these eventually cause you to release histamine into the blood stream (hence the use of antihistamines). Although meant to be protective to the body, it is this release that causes your discomfort, such as frequent sneezing, congestion and an itchy runny nose.  CAUSES  The pollen allergens may come from grasses, trees, and weeds. This is seasonal allergic rhinitis, or "hay fever." Other allergens cause year-round allergic rhinitis (perennial allergic rhinitis) such as house dust mite allergen, pet dander and mold spores.  SYMPTOMS   Nasal stuffiness (congestion).  Runny, itchy nose with sneezing and tearing of the eyes.  There is often an itching of the mouth, eyes and ears. It cannot be cured, but it can be controlled with medications. DIAGNOSIS  If you are unable to determine the offending allergen, skin or blood testing may find it. TREATMENT   Avoid the allergen.  Medications and allergy shots (immunotherapy) can help.  Hay fever may often be treated with antihistamines in pill or nasal spray forms. Antihistamines block the effects of histamine. There are over-the-counter medicines that may help with nasal congestion and swelling around the eyes. Check with your caregiver before taking or giving this medicine. If the treatment above does not work, there are many new medications your caregiver can prescribe. Stronger medications may be used if initial measures are ineffective. Desensitizing injections can be used if medications and avoidance fails. Desensitization is when a patient is given ongoing shots until the body becomes less sensitive to the allergen. Make sure you follow up with your caregiver if problems continue. SEEK MEDICAL  CARE IF:   You develop fever (more than 100.5 F (38.1 C).  You develop a cough that does not stop easily (persistent).  You have shortness of breath.  You start wheezing.  Symptoms interfere with normal daily activities. Document Released: 10/16/2000 Document Revised: 04/15/2011 Document Reviewed: 04/27/2008 [employer] Patient Information 2014 [employer].  

## 2012-07-18 NOTE — ED Provider Notes (Signed)
History     CSN: [id number]  Arrival date & time 07/18/12  1123   First MD Initiated Contact with Patient 07/18/12 1226      Chief Complaint  Patient presents with  . URI    (Consider location/radiation/quality/duration/timing/severity/associated sxs/prior treatment) Patient is a 72 y.o. female presenting with URI. The history is provided by the patient.  URI Presenting symptoms: congestion and rhinorrhea   Presenting symptoms: no cough, no ear pain, no facial pain, no fatigue, no fever and no sore throat   Severity:  Mild Timing:  Constant Progression:  Unchanged Relieved by:  None tried Associated symptoms: no arthralgias, no headaches, no myalgias, no neck pain, no sinus pain, no sneezing, no swollen glands and no wheezing     Past Medical History  Diagnosis Date  . Asthma   . Hypertension   . OSA on CPAP   . Dyslipidemia   . Hypothyroidism   . LBBB (left bundle branch block)     Past Surgical History  Procedure Laterality Date  . Cholecystectomy  1976  . Tonsillectomy  1965  . Abdominal hysterectomy  1969  . Plantar fascia release    . US echocardiography  09/20/2008    borderline LVH,mild TR,AOV mildly sclerotic w/ca+ of the leaflets  . Nm myoview ltd  02/13/2010    No ischemia    No family history on file.  History  Substance Use Topics  . Smoking status: Former Smoker    Quit date: 02/04/2006  . Smokeless tobacco: Not on file  . Alcohol Use: No    OB History   Grav Para Term Preterm Abortions TAB SAB Ect Mult Living                  Review of Systems  Constitutional: Negative for fever and fatigue.  HENT: Positive for congestion and rhinorrhea. Negative for ear pain, sore throat, sneezing and neck pain.   Respiratory: Negative for cough and wheezing.   Musculoskeletal: Negative for myalgias and arthralgias.  Neurological: Negative for headaches.  All other systems reviewed and are negative.    Allergies  Penicillins  Home Medications    Current Outpatient Rx  Name  Route  Sig  Dispense  Refill  . esomeprazole (NEXIUM) 40 MG capsule   Oral   Take 40 mg by mouth daily before breakfast.           . ezetimibe-simvastatin (VYTORIN) 10-40 MG per tablet   Oral   Take 1 tablet by mouth daily.          . montelukast (SINGULAIR) 10 MG tablet   Oral   Take 10 mg by mouth at bedtime.         . valsartan-hydrochlorothiazide (DIOVAN-HCT) 320-25 MG per tablet   Oral   Take 1 tablet by mouth daily.         Marland Kitchen albuterol (PROVENTIL HFA;VENTOLIN HFA) 108 (90 BASE) MCG/ACT inhaler   Inhalation   Inhale 2 puffs into the lungs every 4 (four) hours as needed. For shortness of breath and wheezing         . azelastine (ASTELIN) 137 MCG/SPRAY nasal spray   Nasal   Place 2 sprays into the nose 2 (two) times daily. Use in each nostril as directed   30 mL   12   . beclomethasone (QVAR) 80 MCG/ACT inhaler   Inhalation   Inhale 2 puffs into the lungs 2 (two) times daily.         Marland Kitchen  cycloSPORINE (RESTASIS) 0.05 % ophthalmic emulsion      1 drop 2 (two) times daily.         . fluticasone (FLONASE) 50 MCG/ACT nasal spray   Nasal   Place 2 sprays into the nose daily.   16 g   2   . furosemide (LASIX) 20 MG tablet   Oral   Take 20 mg by mouth 2 (two) times daily.         Marland Kitchen levocetirizine (XYZAL) 5 MG tablet   Oral   Take 5 mg by mouth every evening.         Marland Kitchen levothyroxine (SYNTHROID, LEVOTHROID) 75 MCG tablet   Oral   Take 75 mcg by mouth daily.           . Potassium 95 MG TBCR   Oral   Take by mouth.         . topiramate (TOPAMAX) 50 MG tablet   Oral   Take 50 mg by mouth 2 (two) times daily as needed.         . Vitamin D, Ergocalciferol, (DRISDOL) 50000 UNITS CAPS   Oral   Take 50,000 Units by mouth every 7 (seven) days.           BP 154/88  Pulse 73  Temp(Src) 98.4 F (36.9 C) (Oral)  Resp 18  SpO2 97%  Physical Exam  Nursing note and vitals reviewed. Constitutional: She is  oriented to person, place, and time. She appears well-developed and well-nourished.  HENT:  Head: Normocephalic and atraumatic.  Eyes: Conjunctivae and EOM are normal. Pupils are equal, round, and reactive to light.  Neck: Normal range of motion. Neck supple.  Cardiovascular: Normal rate and normal heart sounds.  Exam reveals no gallop and no friction rub.   No murmur heard. Pulmonary/Chest: Effort normal and breath sounds normal. No respiratory distress. She has no wheezes. She has no rales. She exhibits no tenderness.  Musculoskeletal: Normal range of motion.  Neurological: She is alert and oriented to person, place, and time.  Skin: Skin is warm and dry.  Psychiatric: She has a normal mood and affect. Her behavior is normal.    ED Course  Procedures (including critical care time)  Labs Reviewed - No data to display No results found.   1. Allergic rhinitis       MDM  Discussed dx.  H/o tolerated flonase.  Already on zyrtec and singulair though doesn't take daily.  Discussed using them daily and adding the nasal sprays ordered.  SE and precaustions discusssed        Maryelizabeth Rowan, MD 07/18/12 1254

## 2012-07-18 NOTE — ED Notes (Signed)
Pt c/o cold sxs onset 2 days... sxs include: left ear clogged, dizziness, itchy eyes, post nasal drip, facial pressure, dry cough... Denies: f/v/d, SOB, wheezing Taking albuterol w/temp relief.Marland Kitchen Hx of asthma... She is alert and oriented w/no signs of acute distress.

## 2012-07-25 ENCOUNTER — Emergency Department (HOSPITAL_COMMUNITY)
Admission: EM | Admit: 2012-07-25 | Discharge: 2012-07-25 | Disposition: A | Discharge status: Home / Self Care | Payer: Medicare Other | Attending: Emergency Medicine | Admitting: Emergency Medicine

## 2012-07-25 ENCOUNTER — Encounter (HOSPITAL_COMMUNITY): Payer: Self-pay | Admitting: Emergency Medicine

## 2012-07-25 DIAGNOSIS — Z8679 Personal history of other diseases of the circulatory system: Secondary | ICD-10-CM | POA: Insufficient documentation

## 2012-07-25 DIAGNOSIS — J45909 Unspecified asthma, uncomplicated: Secondary | ICD-10-CM | POA: Insufficient documentation

## 2012-07-25 DIAGNOSIS — Y929 Unspecified place or not applicable: Secondary | ICD-10-CM | POA: Insufficient documentation

## 2012-07-25 DIAGNOSIS — W57XXXA Bitten or stung by nonvenomous insect and other nonvenomous arthropods, initial encounter: Secondary | ICD-10-CM

## 2012-07-25 DIAGNOSIS — Y9389 Activity, other specified: Secondary | ICD-10-CM | POA: Insufficient documentation

## 2012-07-25 DIAGNOSIS — I10 Essential (primary) hypertension: Secondary | ICD-10-CM | POA: Insufficient documentation

## 2012-07-25 DIAGNOSIS — S80262A Insect bite (nonvenomous), left knee, initial encounter: Secondary | ICD-10-CM

## 2012-07-25 DIAGNOSIS — S90569A Insect bite (nonvenomous), unspecified ankle, initial encounter: Secondary | ICD-10-CM | POA: Insufficient documentation

## 2012-07-25 DIAGNOSIS — Z79899 Other long term (current) drug therapy: Secondary | ICD-10-CM | POA: Insufficient documentation

## 2012-07-25 DIAGNOSIS — IMO0002 Reserved for concepts with insufficient information to code with codable children: Secondary | ICD-10-CM | POA: Insufficient documentation

## 2012-07-25 DIAGNOSIS — Z880 Allergy status to penicillin: Secondary | ICD-10-CM | POA: Insufficient documentation

## 2012-07-25 DIAGNOSIS — Z87891 Personal history of nicotine dependence: Secondary | ICD-10-CM | POA: Insufficient documentation

## 2012-07-25 DIAGNOSIS — G4733 Obstructive sleep apnea (adult) (pediatric): Secondary | ICD-10-CM | POA: Insufficient documentation

## 2012-07-25 DIAGNOSIS — E785 Hyperlipidemia, unspecified: Secondary | ICD-10-CM | POA: Insufficient documentation

## 2012-07-25 DIAGNOSIS — E039 Hypothyroidism, unspecified: Secondary | ICD-10-CM | POA: Insufficient documentation

## 2012-07-25 MED ORDER — PREDNISONE 20 MG PO TABS
40.0000 mg | ORAL_TABLET | Freq: Once | ORAL | Status: AC
  Administered 2012-07-25: 40 mg via ORAL
  Filled 2012-07-25: qty 2

## 2012-07-25 MED ORDER — FAMOTIDINE 20 MG PO TABS: 20.0000 mg | ORAL_TABLET | Freq: Two times a day (BID) | ORAL | Status: DC

## 2012-07-25 MED ORDER — DIPHENHYDRAMINE HCL 25 MG PO CAPS
50.0000 mg | ORAL_CAPSULE | Freq: Once | ORAL | Status: AC
  Filled 2012-07-25 (×2): qty 1

## 2012-07-25 MED ORDER — DIPHENHYDRAMINE HCL 25 MG PO TABS: 25.0000 mg | ORAL_TABLET | Freq: Four times a day (QID) | ORAL | Status: DC

## 2012-07-25 MED ORDER — FAMOTIDINE 20 MG PO TABS
20.0000 mg | ORAL_TABLET | Freq: Once | ORAL | Status: AC
  Administered 2012-07-25: 20 mg via ORAL
  Filled 2012-07-25: qty 1

## 2012-07-25 NOTE — ED Provider Notes (Signed)
History    This chart was scribed for Glade Nurse, non-physician practitioner working with Raeford Razor, MD by Leone Payor, ED Scribe. This patient was seen in room WTR6/WTR6 and the patient's care was started at 1652.   CSN: [id number]  Arrival date & time 07/25/12  1652   First MD Initiated Contact with Patient 07/25/12 1659      Chief Complaint  Patient presents with  . Knee Pain    1 day hx of itching and swelling on top of l/knee  . Insect Bite    4 isolated red raised areas noted     The history is provided by the patient. No language interpreter was used.    HPI Comments: Andrea Lowery is a 72 y.o. female no pertinent past medical history who presents to the Emergency Department complaining of insect bites to the L knee last night. States she has some mild swelling with redness, pain, and swelling. Rates the pain as 8/10 yesterday and 6-7/10 currently. She did not see what exactly bit her. She has taken hydrocodeine with no relief. States she takes allergy shots but has not used benadryl at home. Denies fever, SOB, vomiting.   Past Medical History  Diagnosis Date  . Asthma   . Hypertension   . OSA on CPAP   . Dyslipidemia   . Hypothyroidism   . LBBB (left bundle branch block)     Past Surgical History  Procedure Laterality Date  . Cholecystectomy  1976  . Tonsillectomy  1965  . Abdominal hysterectomy  1969  . Plantar fascia release    . US echocardiography  09/20/2008    borderline LVH,mild TR,AOV mildly sclerotic w/ca+ of the leaflets  . Nm myoview ltd  02/13/2010    No ischemia    No family history on file.  History  Substance Use Topics  . Smoking status: Former Smoker    Quit date: 02/04/2006  . Smokeless tobacco: Not on file  . Alcohol Use: No    OB History   Grav Para Term Preterm Abortions TAB SAB Ect Mult Living                  Review of Systems  Constitutional: Negative for fever and diaphoresis.  HENT: Negative for neck pain and  neck stiffness.   Eyes: Negative for visual disturbance.  Respiratory: Negative for apnea, chest tightness and shortness of breath.   Cardiovascular: Negative for chest pain and palpitations.  Gastrointestinal: Negative for nausea, vomiting, diarrhea and constipation.  Genitourinary: Negative for dysuria.  Musculoskeletal: Negative for gait problem.  Skin: Negative for rash.       Multiple insect bites to L knee  Neurological: Negative for dizziness, weakness, light-headedness, numbness and headaches.    Allergies  Penicillins  Home Medications   Current Outpatient Rx  Name  Route  Sig  Dispense  Refill  . albuterol (PROVENTIL HFA;VENTOLIN HFA) 108 (90 BASE) MCG/ACT inhaler   Inhalation   Inhale 2 puffs into the lungs every 4 (four) hours as needed. For shortness of breath and wheezing         . azelastine (ASTELIN) 137 MCG/SPRAY nasal spray   Nasal   Place 2 sprays into the nose 2 (two) times daily. Use in each nostril as directed   30 mL   12   . beclomethasone (QVAR) 80 MCG/ACT inhaler   Inhalation   Inhale 2 puffs into the lungs 2 (two) times daily.         Marland Kitchen  cycloSPORINE (RESTASIS) 0.05 % ophthalmic emulsion      1 drop 2 (two) times daily.         . diclofenac sodium (VOLTAREN) 1 % GEL   Topical   Apply 2 g topically daily as needed.         Marland Kitchen esomeprazole (NEXIUM) 40 MG capsule   Oral   Take 40 mg by mouth daily before breakfast.           . ezetimibe-simvastatin (VYTORIN) 10-40 MG per tablet   Oral   Take 1 tablet by mouth daily.          . fluticasone (FLONASE) 50 MCG/ACT nasal spray   Nasal   Place 2 sprays into the nose daily.   16 g   2   . furosemide (LASIX) 20 MG tablet   Oral   Take 20 mg by mouth 2 (two) times daily.         Marland Kitchen levocetirizine (XYZAL) 5 MG tablet   Oral   Take 5 mg by mouth every evening.         Marland Kitchen levothyroxine (SYNTHROID, LEVOTHROID) 75 MCG tablet   Oral   Take 75 mcg by mouth daily.           .  montelukast (SINGULAIR) 10 MG tablet   Oral   Take 10 mg by mouth at bedtime.         . Olopatadine HCl (PATADAY) 0.2 % SOLN   Ophthalmic   Apply 1 drop to eye daily. Instill one drop in each eye         . topiramate (TOPAMAX) 50 MG tablet   Oral   Take 50 mg by mouth 2 (two) times daily as needed.         . valsartan-hydrochlorothiazide (DIOVAN-HCT) 320-25 MG per tablet   Oral   Take 1 tablet by mouth daily.         . Vitamin D, Ergocalciferol, (DRISDOL) 50000 UNITS CAPS   Oral   Take 50,000 Units by mouth every 7 (seven) days.           There were no vitals taken for this visit.  Physical Exam  Nursing note and vitals reviewed. Constitutional: She is oriented to person, place, and time. She appears well-developed and well-nourished. No distress.  HENT:  Head: Normocephalic and atraumatic.  Eyes: Conjunctivae and EOM are normal. Pupils are equal, round, and reactive to light.  Neck: Normal range of motion. Neck supple.  No meningeal signs  Cardiovascular: Normal rate, regular rhythm and normal heart sounds.  Exam reveals no gallop and no friction rub.   No murmur heard. Pulmonary/Chest: Effort normal and breath sounds normal. No respiratory distress. She has no wheezes. She has no rales. She exhibits no tenderness.  Abdominal: Soft. Bowel sounds are normal. She exhibits no distension. There is no tenderness. There is no rebound and no guarding.  Musculoskeletal: Normal range of motion. She exhibits no edema and no tenderness.  5/5 strength throughout. No warmth. No effusion. Good quadricep strength on straight leg raise. No joint laxity.  Neurological: She is alert and oriented to person, place, and time. No cranial nerve deficit.  Skin: Skin is warm and dry. She is not diaphoretic. There is erythema.  L knee has five 1cm discrete pustules resembling bug bites. They are erythematous and edematous. Not warm to touch. Not bullous in nature. No streaking.     ED  Course  Procedures (including critical care time)  DIAGNOSTIC STUDIES: Oxygen Saturation is 97% on RA, adequate by my interpretation.    COORDINATION OF CARE: 5:19 PM Discussed treatment plan with pt at bedside and pt agreed to plan.   Labs Reviewed - No data to display No results found.  Medications  diphenhydrAMINE (BENADRYL) capsule 50 mg (0 mg Oral Return to [facility] 07/25/12 1741)  famotidine (PEPCID) tablet 20 mg (20 mg Oral Given 07/25/12 1741)  predniSONE (DELTASONE) tablet 40 mg (40 mg Oral Given 07/25/12 1741)    1. Insect bite of knee with local reaction, left, initial encounter    Discharge Medication List as of 07/25/2012  5:57 PM    START taking these medications   Details  diphenhydrAMINE (BENADRYL) 25 MG tablet Take 1 tablet (25 mg total) by mouth every 6 (six) hours., Starting 07/25/2012, Until Discontinued, Print    famotidine (PEPCID) 20 MG tablet Take 1 tablet (20 mg total) by mouth 2 (two) times daily., Starting 07/25/2012, Until Discontinued, Print          MDM  Pt feeling better after course of benadryl, pepcid, prednisone. Will c/d pt with over the counter benadry and pepcid. Patient re-evaluated prior to dc, is hemodynamically stable, in no respiratory distress, and denies the feeling of throat closing. Pt has been advised to return to the ED if they have a mod-severe allergic rxn (s/s including throat closing, difficulty breathing, swelling of lips face or tongue). Pt is to follow up with their PCP. Pt is agreeable with plan & verbalizes understanding.  I personally performed the services described in this documentation, which was scribed in my presence. The recorded information has been reviewed and is accurate.     Glade Nurse, PA-C 07/25/12 2158

## 2012-07-25 NOTE — Discharge Instructions (Signed)
1. Take benadryl and pepcid (which can also be purchased over the counter) for relief of symptoms.  2. Call your primary doctor if symptoms are not improving by Monday.  3.  SEEK IMMEDIATE MEDICAL CARE IF:  You have increased pain, redness, or swelling in the bite area.  You see a red line on the skin coming from the bite.  You have a fever.  You have joint pain.  You have a headache or neck pain.  You have unusual weakness.  You have a rash.  You have chest pain or shortness of breath.  You have abdominal pain, nausea, or vomiting.  You feel unusually tired or sleepy.

## 2012-07-25 NOTE — ED Notes (Signed)
Pt reports that she noted swelling and "bites" on l/knee last night

## 2012-08-01 NOTE — ED Provider Notes (Signed)
Medical screening examination/treatment/procedure(s) were performed by non-physician practitioner and as supervising physician I was immediately available for consultation/collaboration.  Raeford Razor, MD 08/01/12 [phone]

## 2012-10-06 ENCOUNTER — Other Ambulatory Visit: Payer: Self-pay | Admitting: *Deleted

## 2012-10-06 NOTE — Telephone Encounter (Signed)
Faxed patient's PCP documentation of Fluzone High-Dose 2014 vaccine, administered on 10/02/2012. Notification received from patient's pharmacy [facility], [address] [place], [address])

## 2012-10-12 ENCOUNTER — Other Ambulatory Visit: Payer: Self-pay | Admitting: Internal Medicine

## 2012-10-12 NOTE — Telephone Encounter (Signed)
Rx was sent to pharmacy electronically. 

## 2012-10-21 ENCOUNTER — Telehealth: Payer: Self-pay | Admitting: Internal Medicine

## 2012-10-21 MED ORDER — EZETIMIBE-SIMVASTATIN 10-40 MG PO TABS: 1.0000 | ORAL_TABLET | Freq: Every day | ORAL | Status: DC

## 2012-10-21 NOTE — Telephone Encounter (Signed)
Returned call.  Pt informed message received and Dr. Rennis Golden will be notified.  Also informed samples available and will be left at front desk.  Pt verbalized understanding and agreed w/ plan.  Message forwarded to Dr. Rennis Golden.

## 2012-10-21 NOTE — Telephone Encounter (Signed)
Insurance wouid not approve her Vytorin.Could he prescribe something else.Please call to [facility] [phone]

## 2012-10-21 NOTE — Telephone Encounter (Signed)
She could try atorvastatin 80 mg QHS, instead.  -Dr. Rennis Golden

## 2012-10-22 ENCOUNTER — Telehealth: Payer: Self-pay | Admitting: Internal Medicine

## 2012-10-22 MED ORDER — ROSUVASTATIN CALCIUM 40 MG PO TABS: 40.0000 mg | ORAL_TABLET | Freq: Every day | ORAL | Status: DC

## 2012-10-22 MED ORDER — ATORVASTATIN CALCIUM 80 MG PO TABS: 80.0000 mg | ORAL_TABLET | Freq: Every day | ORAL | Status: DC

## 2012-10-22 NOTE — Telephone Encounter (Signed)
Crestor 40 mg

## 2012-10-22 NOTE — Telephone Encounter (Signed)
Atorvastatin 80mg  not covered by insurance.. Other suggestions?

## 2012-10-22 NOTE — Telephone Encounter (Signed)
FORWARD TO JENNA 

## 2012-10-22 NOTE — Telephone Encounter (Signed)
Called patient with medication recommendations per Dr. Rennis Golden. (Vytorin to Lipitor). Patient agreed with plan and verbalized understanding. Order for atorvastatin 80mg  QHS placed and sent to pharmacy electronically.

## 2012-10-22 NOTE — Telephone Encounter (Signed)
Wanted to let us know that generic Lipitor is not covered by her insurance---we will have to call in something else.

## 2012-10-22 NOTE — Telephone Encounter (Signed)
Called to inform patient of new order for Crestor 40mg  PO QD

## 2012-10-25 ENCOUNTER — Telehealth: Payer: Self-pay | Admitting: *Deleted

## 2012-10-25 NOTE — Telephone Encounter (Signed)
Faxed CPAP order supply back. 

## 2012-10-26 ENCOUNTER — Other Ambulatory Visit: Payer: Self-pay

## 2012-10-26 DIAGNOSIS — Z1231 Encounter for screening mammogram for malignant neoplasm of breast: Secondary | ICD-10-CM

## 2012-11-03 ENCOUNTER — Emergency Department (HOSPITAL_BASED_OUTPATIENT_CLINIC_OR_DEPARTMENT_OTHER)
Admission: EM | Admit: 2012-11-03 | Discharge: 2012-11-03 | Disposition: A | Discharge status: Home / Self Care | Payer: Medicare Other | Attending: Emergency Medicine | Admitting: Emergency Medicine

## 2012-11-03 ENCOUNTER — Encounter (HOSPITAL_BASED_OUTPATIENT_CLINIC_OR_DEPARTMENT_OTHER): Payer: Self-pay | Admitting: *Deleted

## 2012-11-03 ENCOUNTER — Emergency Department (HOSPITAL_BASED_OUTPATIENT_CLINIC_OR_DEPARTMENT_OTHER): Payer: Medicare Other

## 2012-11-03 DIAGNOSIS — IMO0001 Reserved for inherently not codable concepts without codable children: Secondary | ICD-10-CM | POA: Insufficient documentation

## 2012-11-03 DIAGNOSIS — J45901 Unspecified asthma with (acute) exacerbation: Secondary | ICD-10-CM | POA: Insufficient documentation

## 2012-11-03 DIAGNOSIS — IMO0002 Reserved for concepts with insufficient information to code with codable children: Secondary | ICD-10-CM | POA: Insufficient documentation

## 2012-11-03 DIAGNOSIS — J069 Acute upper respiratory infection, unspecified: Secondary | ICD-10-CM | POA: Insufficient documentation

## 2012-11-03 DIAGNOSIS — Z79899 Other long term (current) drug therapy: Secondary | ICD-10-CM | POA: Insufficient documentation

## 2012-11-03 DIAGNOSIS — Z880 Allergy status to penicillin: Secondary | ICD-10-CM | POA: Insufficient documentation

## 2012-11-03 DIAGNOSIS — G4733 Obstructive sleep apnea (adult) (pediatric): Secondary | ICD-10-CM | POA: Insufficient documentation

## 2012-11-03 DIAGNOSIS — E785 Hyperlipidemia, unspecified: Secondary | ICD-10-CM | POA: Insufficient documentation

## 2012-11-03 DIAGNOSIS — R0789 Other chest pain: Secondary | ICD-10-CM | POA: Insufficient documentation

## 2012-11-03 DIAGNOSIS — Z87891 Personal history of nicotine dependence: Secondary | ICD-10-CM | POA: Insufficient documentation

## 2012-11-03 DIAGNOSIS — I10 Essential (primary) hypertension: Secondary | ICD-10-CM | POA: Insufficient documentation

## 2012-11-03 DIAGNOSIS — E039 Hypothyroidism, unspecified: Secondary | ICD-10-CM | POA: Insufficient documentation

## 2012-11-03 LAB — BASIC METABOLIC PANEL
BUN: 8 mg/dL (ref 6–23)
CO2: 23 mEq/L (ref 19–32)
Calcium: 9.7 mg/dL (ref 8.4–10.5)
Chloride: 107 mEq/L (ref 96–112)
Creatinine, Ser: 0.8 mg/dL (ref 0.50–1.10)
GFR calc Af Amer: 85 mL/min — ABNORMAL LOW (ref >90)
GFR calc non Af Amer: 73 mL/min — ABNORMAL LOW (ref >90)
Glucose, Bld: 135 mg/dL — ABNORMAL HIGH (ref 70–99)
Potassium: 3.2 mEq/L — ABNORMAL LOW (ref 3.5–5.1)
Sodium: 140 mEq/L (ref 135–145)

## 2012-11-03 LAB — CBC WITH DIFFERENTIAL/PLATELET
Basophils Absolute: 0 10*3/uL (ref 0.0–0.1)
Basophils Relative: 0 % (ref 0–1)
Eosinophils Absolute: 0.2 10*3/uL (ref 0.0–0.7)
Eosinophils Relative: 3 % (ref 0–5)
HCT: 35.7 % — ABNORMAL LOW (ref 36.0–46.0)
Hemoglobin: 12.1 g/dL (ref 12.0–15.0)
Lymphocytes Relative: 23 % (ref 12–46)
Lymphs Abs: 1.8 10*3/uL (ref 0.7–4.0)
MCH: 28.2 pg (ref 26.0–34.0)
MCHC: 33.9 g/dL (ref 30.0–36.0)
MCV: 83.2 fL (ref 78.0–100.0)
Monocytes Absolute: 0.9 10*3/uL (ref 0.1–1.0)
Monocytes Relative: 11 % (ref 3–12)
Neutro Abs: 5.1 10*3/uL (ref 1.7–7.7)
Neutrophils Relative %: 63 % (ref 43–77)
Platelets: 203 10*3/uL (ref 150–400)
RBC: 4.29 MIL/uL (ref 3.87–5.11)
RDW: 14.9 % (ref 11.5–15.5)
WBC: 8 10*3/uL (ref 4.0–10.5)

## 2012-11-03 LAB — TROPONIN I: Troponin I: <0.3 ng/mL (ref <0.30)

## 2012-11-03 LAB — URINALYSIS, ROUTINE W REFLEX MICROSCOPIC
Bilirubin Urine: NEGATIVE
Glucose, UA: NEGATIVE mg/dL
Hgb urine dipstick: NEGATIVE
Ketones, ur: NEGATIVE mg/dL
Nitrite: NEGATIVE
Protein, ur: NEGATIVE mg/dL
Specific Gravity, Urine: 1.017 (ref 1.005–1.030)
Urobilinogen, UA: 1 mg/dL (ref 0.0–1.0)
pH: 6.5 (ref 5.0–8.0)

## 2012-11-03 LAB — PRO B NATRIURETIC PEPTIDE: Pro B Natriuretic peptide (BNP): 201.6 pg/mL — ABNORMAL HIGH (ref 0–125)

## 2012-11-03 LAB — URINE MICROSCOPIC-ADD ON

## 2012-11-03 MED ORDER — GUAIFENESIN-CODEINE 100-10 MG/5ML PO SOLN
5.0000 mL | Freq: Once | ORAL | Status: AC
  Administered 2012-11-03: 5 mL via ORAL
  Filled 2012-11-03: qty 5

## 2012-11-03 MED ORDER — GUAIFENESIN-CODEINE 100-10 MG/5ML PO SOLN: 5.0000 mL | Freq: Three times a day (TID) | ORAL | Status: DC | PRN

## 2012-11-03 NOTE — ED Notes (Signed)
Patient states she has a two or three day history of chest congestion and coughing.  States her symptoms are associated with generalized body aches, head ache, sore throat, and chills.  Describes cough as productive with yellow secretions.  Hx asthma.

## 2012-11-03 NOTE — Discharge Instructions (Signed)
Upper Respiratory Infection, Adult An upper respiratory infection (URI) is also sometimes known as the common cold. The upper respiratory tract includes the nose, sinuses, throat, trachea, and bronchi. Bronchi are the airways leading to the lungs. Most people improve within 1 week, but symptoms can last up to 2 weeks. A residual cough may last even longer.  CAUSES Many different viruses can infect the tissues lining the upper respiratory tract. The tissues become irritated and inflamed and often become very moist. Mucus production is also common. A cold is contagious. You can easily spread the virus to others by oral contact. This includes kissing, sharing a glass, coughing, or sneezing. Touching your mouth or nose and then touching a surface, which is then touched by another person, can also spread the virus. SYMPTOMS  Symptoms typically develop 1 to 3 days after you come in contact with a cold virus. Symptoms vary from person to person. They may include:  Runny nose.  Sneezing.  Nasal congestion.  Sinus irritation.  Sore throat.  Loss of voice (laryngitis).  Cough.  Fatigue.  Muscle aches.  Loss of appetite.  Headache.  Low-grade fever. DIAGNOSIS  You might diagnose your own cold based on familiar symptoms, since most people get a cold 2 to 3 times a year. Your caregiver can confirm this based on your exam. Most importantly, your caregiver can check that your symptoms are not due to another disease such as strep throat, sinusitis, pneumonia, asthma, or epiglottitis. Blood tests, throat tests, and X-rays are not necessary to diagnose a common cold, but they may sometimes be helpful in excluding other more serious diseases. Your caregiver will decide if any further tests are required. RISKS AND COMPLICATIONS  You may be at risk for a more severe case of the common cold if you smoke cigarettes, have chronic heart disease (such as heart failure) or lung disease (such as asthma), or if  you have a weakened immune system. The very young and very old are also at risk for more serious infections. Bacterial sinusitis, middle ear infections, and bacterial pneumonia can complicate the common cold. The common cold can worsen asthma and chronic obstructive pulmonary disease (COPD). Sometimes, these complications can require emergency medical care and may be life-threatening. PREVENTION  The best way to protect against getting a cold is to practice good hygiene. Avoid oral or hand contact with people with cold symptoms. Wash your hands often if contact occurs. There is no clear evidence that vitamin C, vitamin E, echinacea, or exercise reduces the chance of developing a cold. However, it is always recommended to get plenty of rest and practice good nutrition. TREATMENT  Treatment is directed at relieving symptoms. There is no cure. Antibiotics are not effective, because the infection is caused by a virus, not by bacteria. Treatment may include:  Increased fluid intake. Sports drinks offer valuable electrolytes, sugars, and fluids.  Breathing heated mist or steam (vaporizer or shower).  Eating chicken soup or other clear broths, and maintaining good nutrition.  Getting plenty of rest.  Using gargles or lozenges for comfort.  Controlling fevers with ibuprofen or acetaminophen as directed by your caregiver.  Increasing usage of your inhaler if you have asthma. Zinc gel and zinc lozenges, taken in the first 24 hours of the common cold, can shorten the duration and lessen the severity of symptoms. Pain medicines may help with fever, muscle aches, and throat pain. A variety of non-prescription medicines are available to treat congestion and runny nose. Your caregiver  can make recommendations and may suggest nasal or lung inhalers for other symptoms.  HOME CARE INSTRUCTIONS   Only take over-the-counter or prescription medicines for pain, discomfort, or fever as directed by your  caregiver.  Use a warm mist humidifier or inhale steam from a shower to increase air moisture. This may keep secretions moist and make it easier to breathe.  Drink enough water and fluids to keep your urine clear or pale yellow.  Rest as needed.  Return to work when your temperature has returned to normal or as your caregiver advises. You may need to stay home longer to avoid infecting others. You can also use a face mask and careful hand washing to prevent spread of the virus. SEEK MEDICAL CARE IF:   After the first few days, you feel you are getting worse rather than better.  You need your caregiver's advice about medicines to control symptoms.  You develop chills, worsening shortness of breath, or brown or red sputum. These may be signs of pneumonia.  You develop yellow or brown nasal discharge or pain in the face, especially when you bend forward. These may be signs of sinusitis.  You develop a fever, swollen neck glands, pain with swallowing, or white areas in the back of your throat. These may be signs of strep throat. SEEK IMMEDIATE MEDICAL CARE IF:   You have a fever.  You develop severe or persistent headache, ear pain, sinus pain, or chest pain.  You develop wheezing, a prolonged cough, cough up blood, or have a change in your usual mucus (if you have chronic lung disease).  You develop sore muscles or a stiff neck. Document Released: 07/17/2000 Document Revised: 04/15/2011 Document Reviewed: 05/25/2010 [facility] Patient Information 2014 [place], [place].  You may alternate between Tylenol and ibuprofen as needed for fever and pain. You may take 650 mg of Tylenol every 6 hours as needed or 600 mg of ibuprofen every 8 hours as needed. Please take ibuprofen with food. If you develop worsening symptoms of shortness of breath or wheezing that are not resolved with your breathing treatments at home, chest pain that is unrelated to coughing, vomiting and cannot keep down fluids,  bloody stools, severe headache or neck pain, these return to the emergency department.

## 2012-11-03 NOTE — ED Provider Notes (Signed)
TIME SEEN: 11:01 AM  CHIEF COMPLAINT: Chills, cough with yellow/green sputum, body aches, sore throat  HPI: Patient is a 72 year old female with a history of asthma, hypertension, hyperlipidemia, hypothyroidism, left bundle branch block, obstructive sleep apnea on CPAP who presents emergency department with 3 days of chills, sore throat, productive cough with green/yellow sputum, intermittent wheezing, and myalgias. Patient denies any sick contacts. She states she is having diffuse mild sore chest pain that she believes is from coughing frequently. She denies any current wheezing or shortness of breath. She denies any vomiting or diarrhea. No skin rash. No recent travel. No headache, neck pain or neck stiffness.  ROS: See HPI Constitutional: no fever  Eyes: no drainage  ENT: no runny nose   Cardiovascular:  chest pain  Resp: no SOB  GI: no vomiting GU: no dysuria Integumentary: no rash  Allergy: no hives  Musculoskeletal: no leg swelling  Neurological: no slurred speech ROS otherwise negative  PAST MEDICAL HISTORY/PAST SURGICAL HISTORY:  Past Medical History  Diagnosis Date  . Asthma   . Hypertension   . OSA on CPAP   . Dyslipidemia   . Hypothyroidism   . LBBB (left bundle branch block)     MEDICATIONS:  Prior to Admission medications   Medication Sig Start Date End Date Taking? Authorizing Provider  albuterol (PROVENTIL HFA;VENTOLIN HFA) 108 (90 BASE) MCG/ACT inhaler Inhale 2 puffs into the lungs every 4 (four) hours as needed. For shortness of breath and wheezing    Historical Provider, MD  azelastine (ASTELIN) 137 MCG/SPRAY nasal spray Place 2 sprays into the nose 2 (two) times daily. Use in each nostril as directed 07/18/12   Maryelizabeth Rowan, MD  beclomethasone (QVAR) 80 MCG/ACT inhaler Inhale 2 puffs into the lungs 2 (two) times daily.    Historical Provider, MD  cycloSPORINE (RESTASIS) 0.05 % ophthalmic emulsion 1 drop 2 (two) times daily.    Historical Provider, MD   diclofenac sodium (VOLTAREN) 1 % GEL Apply 2 g topically daily as needed.    Historical Provider, MD  diphenhydrAMINE (BENADRYL) 25 MG tablet Take 1 tablet (25 mg total) by mouth every 6 (six) hours. 07/25/12   Glade Nurse, PA-C  esomeprazole (NEXIUM) 40 MG capsule Take 40 mg by mouth daily before breakfast.      Historical Provider, MD  ezetimibe-simvastatin (VYTORIN) 10-40 MG per tablet Take 1 tablet by mouth daily. 10/21/12   Chrystie Nose, MD  famotidine (PEPCID) 20 MG tablet Take 1 tablet (20 mg total) by mouth 2 (two) times daily. 07/25/12   Glade Nurse, PA-C  fluticasone (FLONASE) 50 MCG/ACT nasal spray Place 2 sprays into the nose daily. 07/18/12   Maryelizabeth Rowan, MD  furosemide (LASIX) 20 MG tablet Take 20 mg by mouth 2 (two) times daily.    Historical Provider, MD  levocetirizine (XYZAL) 5 MG tablet Take 5 mg by mouth every evening.    Historical Provider, MD  levothyroxine (SYNTHROID, LEVOTHROID) 75 MCG tablet Take 75 mcg by mouth daily.      Historical Provider, MD  montelukast (SINGULAIR) 10 MG tablet Take 10 mg by mouth at bedtime.    Historical Provider, MD  Olopatadine HCl (PATADAY) 0.2 % SOLN Apply 1 drop to eye daily. Instill one drop in each eye    Historical Provider, MD  rosuvastatin (CRESTOR) 40 MG tablet Take 1 tablet (40 mg total) by mouth daily. 10/22/12   Chrystie Nose, MD  topiramate (TOPAMAX) 50 MG tablet Take 50 mg by mouth 2 (  two) times daily as needed.    Historical Provider, MD  valsartan-hydrochlorothiazide (DIOVAN-HCT) 320-25 MG per tablet Take 1 tablet by mouth daily.    Historical Provider, MD  Vitamin D, Ergocalciferol, (DRISDOL) 50000 UNITS CAPS Take 50,000 Units by mouth every 7 (seven) days.    Historical Provider, MD    ALLERGIES:  Allergies  Allergen Reactions  . Penicillins Hives    SOCIAL HISTORY:  History  Substance Use Topics  . Smoking status: Former Smoker    Quit date: 02/04/2006  . Smokeless tobacco: Not on file  . Alcohol Use: No     FAMILY HISTORY: Family History  Problem Relation Age of Onset  . Diabetes Father   . Heart failure Father     EXAM: BP 141/63  Pulse 70  Temp(Src) 98.4 F (36.9 C) (Oral)  Ht 5\' 8"  (1.727 m)  Wt 195 lb (88.451 kg)  BMI 29.66 kg/m2  SpO2 100% CONSTITUTIONAL: Alert and oriented and responds appropriately to questions. Well-appearing; well-nourished, nontoxic, well-hydrated HEAD: Normocephalic EYES: Conjunctivae clear, PERRL ENT: normal nose; no rhinorrhea; moist mucous membranes; mild pharyngeal erythema without tonsillar exudate, no tonsillar hypertrophy, no trismus or drooling, TMs are clear bilaterally NECK: Supple, no meningismus, no LAD  CARD: RRR; S1 and S2 appreciated; no murmurs, no clicks, no rubs, no gallops RESP: Normal chest excursion without splinting or tachypnea; breath sounds clear and equal bilaterally; no wheezes, no rhonchi, no rales, no respiratory distress ABD/GI: Normal bowel sounds; non-distended; soft, non-tender, no rebound, no guarding BACK:  The back appears normal and is non-tender to palpation, there is no CVA tenderness EXT: Normal ROM in all joints; non-tender to palpation; no edema; normal capillary refill; no cyanosis    SKIN: Normal color for age and race; warm NEURO: Moves all extremities equally PSYCH: The patient's mood and manner are appropriate. Grooming and personal hygiene are appropriate.  MEDICAL DECISION MAKING: Patient likely with viral syndrome. Will have her obtain basic labs given her comorbidities as well as troponin, EKG, chest x-ray and urine. If workup unremarkable and patient is still hemodynamically stable, anticipate discharge home. Patient has PCP for followup. She verbalizes understanding and is comfortable with this plan.   Date: 11/03/2012 11:20 AM  Rate: 75  Rhythm: normal sinus rhythm  QRS Axis: Left axis deviation  Intervals: Left bundle branch block  ST/T Wave abnormalities: normal  Conduction Disutrbances:  none  Narrative Interpretation: Left bundle branch block, no new ischemic changes, unchanged when compared to prior EKG in January 2013      ED PROGRESS: Patient's labs are unremarkable. Urine shows no obvious sign of infection. Urine culture pending. Troponin is negative. BNP is slightly elevated but chest x-ray is clear. Suspect this is a viral illness. Discussed with patient return precautions, instructions for supportive care. She reports she feels much better after guaifenesin and codeine. Will discharge with prescription. She has a PCP for followup. Patient verbalizes understanding and is comfortable with plan.     Layla Maw Ward, DO 11/03/12 1230

## 2012-11-04 LAB — URINE CULTURE
Colony Count: NO GROWTH
Culture: NO GROWTH

## 2012-11-07 ENCOUNTER — Other Ambulatory Visit: Payer: Self-pay | Admitting: Internal Medicine

## 2012-11-09 NOTE — Telephone Encounter (Signed)
Rx was sent to pharmacy electronically. 

## 2012-11-25 ENCOUNTER — Ambulatory Visit: Payer: Medicare Other

## 2012-12-14 ENCOUNTER — Ambulatory Visit: Payer: Medicare Other

## 2013-03-01 ENCOUNTER — Telehealth: Payer: Self-pay | Admitting: *Deleted

## 2013-03-16 ENCOUNTER — Telehealth: Payer: Self-pay | Admitting: Internal Medicine

## 2013-03-16 NOTE — Telephone Encounter (Signed)
Wants to know if she have try the alternative formula for Vytorin? [id number]

## 2013-03-16 NOTE — Telephone Encounter (Signed)
Message forwarded to J. Elkins, RN.  

## 2013-03-18 NOTE — Telephone Encounter (Signed)
LMTCB

## 2013-03-24 NOTE — Telephone Encounter (Signed)
LMTCB

## 2013-03-25 MED ORDER — ROSUVASTATIN CALCIUM 40 MG PO TABS: 40.0000 mg | ORAL_TABLET | Freq: Every day | ORAL | Status: DC

## 2013-03-25 NOTE — Telephone Encounter (Signed)
Called patient to ask if she had supply of vytorin as insurance was not wanting to cover/we had received prior authorization for this. Patient states she has been out of this medication for about 1 month - reports she did not call to ask for samples. Patient states that she called on [place] day (1/15) about an appointment (was told she had one in May - no documentation of call or appointment scheduled) and that she told whomever she spoke with that she was having chest pain and taking baking soda every day for about 1 month. Patient was very upset that whomever she spoke with told her she could wait until May to come in (no documentation of this, patient unable to provide name). Patient reports SOB, stating that she "cannont walk". Offered that patient come in to office to be seen sooner, but patient denied this, stating she just wanted medication straightened out.   When reviewing chart, it appears that vytorin 10-40 was not going to be covered back in the fall, and Dr. Debara Pickett changed patient to Crestor 40mg  QD (ordered on 10/22/12 and patient was notified). Patient stated that she never took Crestor, nor picked it up from pharmacy and that her insurance then started to cover her vytorin. Informed patient that her insurance was not wanting to cover this medicine anymore, and that Dr. Debara Pickett will be notified to clarify which anti-hyperlipidemic/statin she should be taking.   Should she be taking Crestor 40mg ?  Or do appeal process for Vytorin 10-40mg  (RN was notified that this was denied but appeal can be done)

## 2013-03-25 NOTE — Telephone Encounter (Signed)
Crestor 40 mg

## 2013-03-25 NOTE — Telephone Encounter (Signed)
Rx was sent to pharmacy electronically.  Called patient to inform of this.

## 2013-03-25 NOTE — Telephone Encounter (Signed)
Left message with appeals dept after receiving notification that vytorin 10-40mg  was denied

## 2013-04-02 ENCOUNTER — Telehealth: Payer: Self-pay | Admitting: *Deleted

## 2013-04-02 NOTE — Telephone Encounter (Signed)
Returned call to patient. She stated that she had never taken Crestor prior to our conversation on 2/19 when Dr Debara Pickett requested she be on Crestor 40 instead of Vytorin - of note, Dr Debara Pickett had changed her to Crestor in 10/2012 when insurance would not cover Vytorin. While attempting to straighten out this medication issues, RN had processed prior authorization for Vytorin - of which it got approved.. pharmacy notified patient of this and then she was confused which to be on. instructed that Dr Debara Pickett would prefer her take Crestor 40mg . Patient verbalized understanding.

## 2013-04-02 NOTE — Telephone Encounter (Signed)
Returned call and pt verified x 2.  Pt stated she received a call from the pharmacy that Vytorin was approved, but Dr. Debara Pickett had her on Crestor b/c the insurance said they wouldn't pay for it.  Stated she needs to know which one Dr. Debara Pickett wants her to take.  Pt informed Dr. Josepha Pigg, RN will be notified.  Pt verbalized understanding and agreed w/ plan.  Message forwarded to Dr. Josepha Pigg, RN.  Pt has about 3 weeks left of Crestor and has NOT picked up Vytorin yet.

## 2013-04-02 NOTE — Telephone Encounter (Signed)
Pt was calling in regards to her medication. Her insurance stated that they will not pay for her Vytorin. She wants to know if she can go back on Crestor.  [place]

## 2013-04-02 NOTE — Telephone Encounter (Signed)
Returning your call. °

## 2013-04-02 NOTE — Telephone Encounter (Signed)
Documentation from [facility] received stating Vytorin 10-40 is approved #30 for 30 days from 03/15/13 until 03/15/14

## 2013-04-02 NOTE — Telephone Encounter (Signed)
Returned call.  Female answering stated pt is not there.  Informed her pt called about 15 mins ago.  Stated pt was not there and may be able to be reached on cell.    Call to cell and pt's "son" stated she is not with him.  Left message to tell pt her heart doctor's office called her back.  Agreed.  Will await call from pt.  OF NOTE, PT'S [place], per note by Eliezer Lofts, RN.

## 2013-04-05 NOTE — Telephone Encounter (Signed)
Have her take the Crestor if she can.  Dr. Debara Pickett

## 2013-05-03 ENCOUNTER — Telehealth: Payer: Self-pay | Admitting: Internal Medicine

## 2013-05-03 NOTE — Telephone Encounter (Signed)
C/D 05/03/13 for appt. 05/20/13

## 2013-05-04 ENCOUNTER — Ambulatory Visit (INDEPENDENT_AMBULATORY_CARE_PROVIDER_SITE_OTHER): Payer: Medicare Other

## 2013-05-04 ENCOUNTER — Encounter: Payer: Self-pay | Admitting: Podiatry

## 2013-05-04 ENCOUNTER — Ambulatory Visit (INDEPENDENT_AMBULATORY_CARE_PROVIDER_SITE_OTHER): Payer: Medicare Other | Admitting: Podiatry

## 2013-05-04 VITALS — BP 170/98 | HR 63 | Resp 16 | Ht 66.0 in | Wt 196.0 lb

## 2013-05-04 DIAGNOSIS — M779 Enthesopathy, unspecified: Secondary | ICD-10-CM

## 2013-05-04 DIAGNOSIS — M778 Other enthesopathies, not elsewhere classified: Secondary | ICD-10-CM

## 2013-05-04 DIAGNOSIS — M7750 Other enthesopathy of unspecified foot: Secondary | ICD-10-CM

## 2013-05-04 DIAGNOSIS — M79609 Pain in unspecified limb: Secondary | ICD-10-CM

## 2013-05-04 DIAGNOSIS — M722 Plantar fascial fibromatosis: Secondary | ICD-10-CM

## 2013-05-04 MED ORDER — METHYLPREDNISOLONE (PAK) 4 MG PO TABS: ORAL_TABLET | ORAL | Status: DC

## 2013-05-04 NOTE — Progress Notes (Signed)
   Subjective:    Patient ID: Andrea Lowery, female    DOB: 03/20/1940, 73 y.o.   MRN: [id number]  HPI N foot pain        L left inferior arch and 1st metatarsal interspace        D 1 month        O off and on        C sharp pain         A no known trigger        T [place] rubs    Review of Systems  All other systems reviewed and are negative.       Objective:   Physical Exam: I reviewed her past medical history medications allergies surgeries social history. Vital signs are stable she is alert and oriented x3. Pulses are palpable bilateral positive edema bilateral foot nonpitting in nature. Neurologic sensorium is intact per since once the monofilament. Deep tendon reflexes are intact and brisk bilaterally symmetrical. Orthopedic evaluation demonstrates all joints distal to the ankle have a full range of motion without crepitus. Bunion repair and second metatarsal osteotomy had been performed to her previously and she has good range of motion without pain. She has pain on palpation to the medial continued tubercle of the left heel with some tenderness in the forefoot left more than likely compensatory. Radiographic evaluation well-healed surgical foot left with a soft tissue increase in density at the plantar fascial calcaneal insertion site.        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  Plan: Discussed etiology pathology conservative versus surgical therapies. I offered her injection but she declined today I wrote a prescription for Medrol Dosepak to be followed by [facility] and she was dispensed a night splint.

## 2013-05-18 ENCOUNTER — Ambulatory Visit: Payer: Medicare Other | Admitting: Podiatry

## 2013-05-20 ENCOUNTER — Other Ambulatory Visit (HOSPITAL_BASED_OUTPATIENT_CLINIC_OR_DEPARTMENT_OTHER): Payer: Commercial Managed Care - HMO

## 2013-05-20 ENCOUNTER — Ambulatory Visit: Payer: Commercial Managed Care - HMO

## 2013-05-20 ENCOUNTER — Ambulatory Visit (HOSPITAL_BASED_OUTPATIENT_CLINIC_OR_DEPARTMENT_OTHER): Payer: Medicaid Other | Admitting: Internal Medicine

## 2013-05-20 ENCOUNTER — Other Ambulatory Visit: Payer: Self-pay | Admitting: Internal Medicine

## 2013-05-20 ENCOUNTER — Encounter: Payer: Self-pay | Admitting: Internal Medicine

## 2013-05-20 ENCOUNTER — Telehealth: Payer: Self-pay | Admitting: Internal Medicine

## 2013-05-20 VITALS — BP 170/83 | HR 81 | Temp 96.9°F | Resp 20 | Ht 66.0 in | Wt 195.5 lb

## 2013-05-20 DIAGNOSIS — D890 Polyclonal hypergammaglobulinemia: Principal | ICD-10-CM

## 2013-05-20 DIAGNOSIS — D472 Monoclonal gammopathy: Secondary | ICD-10-CM

## 2013-05-20 LAB — COMPREHENSIVE METABOLIC PANEL (CC13)
ALT: 28 U/L (ref 0–55)
AST: 20 U/L (ref 5–34)
Albumin: 3.8 g/dL (ref 3.5–5.0)
Alkaline Phosphatase: 84 U/L (ref 40–150)
Anion Gap: 9 mEq/L (ref 3–11)
BUN: 10.2 mg/dL (ref 7.0–26.0)
CO2: 29 mEq/L (ref 22–29)
Calcium: 9.9 mg/dL (ref 8.4–10.4)
Chloride: 102 mEq/L (ref 98–109)
Creatinine: 0.9 mg/dL (ref 0.6–1.1)
Glucose: 116 mg/dl (ref 70–140)
Potassium: 3.8 mEq/L (ref 3.5–5.1)
Sodium: 139 mEq/L (ref 136–145)
Total Bilirubin: 0.56 mg/dL (ref 0.20–1.20)
Total Protein: 8.2 g/dL (ref 6.4–8.3)

## 2013-05-20 LAB — CBC WITH DIFFERENTIAL/PLATELET
BASO%: 0.8 % (ref 0.0–2.0)
Basophils Absolute: 0.1 10*3/uL (ref 0.0–0.1)
EOS%: 2.1 % (ref 0.0–7.0)
Eosinophils Absolute: 0.1 10*3/uL (ref 0.0–0.5)
HCT: 38.6 % (ref 34.8–46.6)
HGB: 12.8 g/dL (ref 11.6–15.9)
LYMPH%: 30.2 % (ref 14.0–49.7)
MCH: 27.9 pg (ref 25.1–34.0)
MCHC: 33.3 g/dL (ref 31.5–36.0)
MCV: 83.9 fL (ref 79.5–101.0)
MONO#: 0.6 10*3/uL (ref 0.1–0.9)
MONO%: 9.5 % (ref 0.0–14.0)
NEUT#: 3.9 10*3/uL (ref 1.5–6.5)
NEUT%: 57.4 % (ref 38.4–76.8)
Platelets: 230 10*3/uL (ref 145–400)
RBC: 4.6 10*6/uL (ref 3.70–5.45)
RDW: 14.9 % — ABNORMAL HIGH (ref 11.2–14.5)
WBC: 6.8 10*3/uL (ref 3.9–10.3)
lymph#: 2 10*3/uL (ref 0.9–3.3)

## 2013-05-20 LAB — LACTATE DEHYDROGENASE (CC13): LDH: 247 U/L — ABNORMAL HIGH (ref 125–245)

## 2013-05-20 NOTE — Patient Instructions (Signed)
Monoclonal gammopathy of undetermined significance (MGUS) is a condition in which an abnormal protein (monoclonal protein, or M protein) is in the blood. M protein is produced by plasma cells, a type of white blood cell. Monoclonal gammopathy of undetermined significance usually causes no problems. Sometimes, monoclonal gammopathy of undetermined significance is either associated with another disease or can progress over years to other disorders, including some forms of blood cancer. If you have monoclonal gammopathy of undetermined significance, you'll usually have periodic checkups to monitor your level of M protein. If there's no increase, monoclonal gammopathy doesn't require treatment. With close monitoring, if monoclonal gammopathy of undetermined significance does progress, you'll get earlier treatment.  Monoclonal gammopathy of undetermined significance rarely causes signs or symptoms. The condition is usually detected by chance when you have a routine blood test for another problem. However, some people may experience nerve problems, such as numbness or tingling, associated with the abnormal protein.  Monoclonal gammopathy of undetermined significance occurs when plasma cells in your bone marrow produce an abnormal protein called monoclonal protein (M protein). Plasma cells are a type of white blood cell. They are found in your bone marrow. Plasma cells produce some of the antibodies that help your body fight infection. In the majority of people with monoclonal gammopathy of undetermined significance, the protein isn't harmful. But when too much M protein accumulates, it crowds out healthy cells in your bone marrow and can damage other tissues in your body. Genetic changes appear to play a role in monoclonal gammopathy of undetermined significance, as do environmental triggers.  Factors that increase your risk of monoclonal gammopathy of undetermined significance include: Your age. The risk of  monoclonal gammopathy of undetermined significance increases as you get older. The highest incidence is among adults age 58 and older.  Your race. Blacks are more likely to experience this condition than are whites.  Your sex. Monoclonal gammopathy of undetermined significance is more common in men than it is in women.  A family history. If other people in your family have monoclonal gammopathy of undetermined significance, your risk of developing the disorder may be higher  Some people with monoclonal gammopathy of undetermined significance develop a more serious condition, such as multiple myeloma or other cancers or blood disorders. Doctors can't definitively predict who will go on to develop a more serious condition, but they can determine who has the greatest risk. Your doctor takes into account several factors when determining your risk, including: The amount of M protein in your blood  The type of M protein  The amount of another small protein (free light chain) in your blood  Your risk of developing a more serious condition increases the longer you've had monoclonal gammopathy of undetermined significance. Also, the more risk factors you have, the higher your risk of developing a more serious condition. Other complications associated with monoclonal gammopathy of undetermined significance include fractures and blood clots.

## 2013-05-20 NOTE — Progress Notes (Signed)
Checked in new patient with no financial issues. She [place]. She has not been out of country,.

## 2013-05-20 NOTE — Telephone Encounter (Signed)
Gave pt appt for lab and MD for April 2015, Bone survey tomorrow

## 2013-05-20 NOTE — Progress Notes (Signed)
[place] Telephone:([phone]) [phone]   [phone]  NEW PATIENT EVALUATION   Name: Andrea Lowery Date: 05/20/2013 MRN: [id number] DOB: October 22, 1940  PCP: [name]., MD   REFERRING PHYSICIAN: Christoper Allegra, MD  REASON FOR REFERRAL: MGUS  HISTORY OF PRESENT ILLNESS:Andrea Lowery is a 73 y.o. 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 Estanislado Pandy of [facility].  She was last seen by Dr. Estanislado Pandy on 02/15/2013 for pain and discomfort and few weeks ago.  She reviewed her labs.  She had DEXA scan and was told that she did not have osteoporosis.  She is off Prolia now.  Dr. Estanislado Pandy also discussed the labs noted below.  She has a long-standing history of multiple arthralgias, left CMC pain, OA of the bilateral knees, hands and feet and osteoporosis for which she is on Prolia previously.  She had SPEP drawn on 02/19/2013 that revealed a restricted band with monoclonal protein present.  The monoclonal protein peak accounted for 1.38 g/dL of the total 1.66 g/dL of protein in the gamma region. Her CMP demonstrated a creatinine of 0.81 and calcium of 9.0 and total protein of 7.2 and albumin of 3.9;  Her CBC was within normal limits with a hemoglobin of 12.5.   Magnesium was 2.1.  IgG of 2,190 mg/dL, IgA of 81 mg/dL, IgM of 22 mg/dL;  She had a normal Parathyroid hormone of 63.6 pg/mL   Vitamin D level was mildly low at 26 ng/mL.   Today, she reports that feels fine overall.  She denies fevers, chills.  She has occasional sweats but are not drenching in nature.  Dr. Willey Blade is her primary care physician.  She last saw her office a few weeks ago for sinus problems and she was given a Z-pack with resolution in symptoms.  Her energy is good.  Her weight is stable with a slight increase.  Her last mammogram was in 2013 without evidence of disease (per her report).  She will schedule a repeat  mammogram this year.  She had a colonoscopy by Dr. Collene Mares in 2013 without polyp removal and she was instructed to follow up in 5 years.  Her last female exam was last week [place] without evidence of disease.  She denies bone pain.  She retired in 1990 from [employer] and [employer].   She lives her older sister in [place].  She reports her aunt had breast cancer.  She was diagnosed in her 89s and followed by Dr. Thomasene Ripple in our office.  Her mother lived to 13 years and passed away from natural causes.  Her father lived to 17 years old and died from heart problems.  She has never seen a hematologist in the past.  She has never required a bone marrow biopsy. She reports occasional migraines.   She is a former smoker and quit 10 years ago.  She smoked 1/2 pack per day for 15 years.    PAST MEDICAL HISTORY:  has a past medical history of Asthma; Hypertension; OSA on CPAP; Dyslipidemia; Hypothyroidism; and LBBB (left bundle branch block).     PAST SURGICAL HISTORY: Past Surgical History  Procedure Laterality Date  . Cholecystectomy  1976  . Tonsillectomy  1965  . Abdominal hysterectomy  1969  . Plantar fascia release    . US echocardiography  09/20/2008    borderline LVH,mild TR,AOV mildly sclerotic w/ca+ of the leaflets  . Nm myoview ltd  02/13/2010    No ischemia     CURRENT MEDICATIONS: has a current medication list which includes the following prescription(s): albuterol, beclomethasone, cyclosporine, diclofenac sodium, diphenhydramine, esomeprazole, fluticasone, furosemide, levocetirizine, levothyroxine, montelukast, olopatadine hcl, rosuvastatin, topiramate, valsartan-hydrochlorothiazide, and vitamin d (ergocalciferol).   ALLERGIES: Penicillins   SOCIAL HISTORY:  reports that she quit smoking about 7 years ago. She does not have any smokeless tobacco history on file. She reports that she does not drink alcohol or use illicit drugs.   FAMILY HISTORY: family history includes Diabetes in her  father; Heart failure in her father.   LABORATORY DATA:  CBC    Component Value Date/Time   WBC 6.8 05/20/2013 1030   WBC 8.0 11/03/2012 1130   RBC 4.60 05/20/2013 1030   RBC 4.29 11/03/2012 1130   HGB 12.8 05/20/2013 1030   HGB 12.1 11/03/2012 1130   HCT 38.6 05/20/2013 1030   HCT 35.7* 11/03/2012 1130   PLT 230 05/20/2013 1030   PLT 203 11/03/2012 1130   MCV 83.9 05/20/2013 1030   MCV 83.2 11/03/2012 1130   MCH 27.9 05/20/2013 1030   MCH 28.2 11/03/2012 1130   MCHC 33.3 05/20/2013 1030   MCHC 33.9 11/03/2012 1130   RDW 14.9* 05/20/2013 1030   RDW 14.9 11/03/2012 1130   LYMPHSABS 2.0 05/20/2013 1030   LYMPHSABS 1.8 11/03/2012 1130   MONOABS 0.6 05/20/2013 1030   MONOABS 0.9 11/03/2012 1130   EOSABS 0.1 05/20/2013 1030   EOSABS 0.2 11/03/2012 1130   BASOSABS 0.1 05/20/2013 1030   BASOSABS 0.0 11/03/2012 1130    CMP     Component Value Date/Time   NA 139 05/20/2013 1030   NA 140 11/03/2012 1130   K 3.8 05/20/2013 1030   K 3.2* 11/03/2012 1130   CL 107 11/03/2012 1130   CO2 29 05/20/2013 1030   CO2 23 11/03/2012 1130   GLUCOSE 116 05/20/2013 1030   GLUCOSE 135* 11/03/2012 1130   BUN 10.2 05/20/2013 1030   BUN 8 11/03/2012 1130   CREATININE 0.9 05/20/2013 1030   CREATININE 0.80 11/03/2012 1130   CALCIUM 9.9 05/20/2013 1030   CALCIUM 9.7 11/03/2012 1130   PROT 8.2 05/20/2013 1030   PROT 9.2* 03/31/2010 0730   ALBUMIN 3.8 05/20/2013 1030   ALBUMIN 4.5 03/31/2010 0730   AST 20 05/20/2013 1030   AST 27 03/31/2010 0730   ALT 28 05/20/2013 1030   ALT 38* 03/31/2010 0730   ALKPHOS 84 05/20/2013 1030   ALKPHOS 101 03/31/2010 0730   BILITOT 0.56 05/20/2013 1030   BILITOT 0.8 03/31/2010 0730   GFRNONAA 73* 11/03/2012 1130   GFRAA 85* 11/03/2012 1130      RADIOGRAPHY: Dg Foot Complete Left  05/04/2013   3 views of the left foot demonstrates an osseously mature foot with a soft  tissue increase in density at the plantar fascial calcaneal insertion  site. This is consistent with plantar fasciitis. Retention of 1  screw to  the second metatarsal head.  11/18/2011  DIGITAL BILATERAL SCREENING MAMMOGRAM WITH CAD Comparison: Previous exams.  Findings: The breast tissue is almost entirely fatty. No suspicious masses, architectural distortion, or calcifications are present. Images were processed with CAD. IMPRESSION: No mammographic evidence of malignancy. A result letter of this screening mammogram will be mailed directly  to the patient. RECOMMENDATION: Screening mammogram in one year. (Code:SM-B-01Y)  BI-RADS CATEGORY 1: Negative.    REVIEW OF SYSTEMS:  Constitutional: Denies fevers, chills or abnormal weight loss Eyes: Denies blurriness of vision Ears,  nose, mouth, throat, and face: Denies mucositis or sore throat Respiratory: Denies cough, dyspnea or wheezes Cardiovascular: Denies palpitation, chest discomfort or lower extremity swelling Gastrointestinal:  Denies nausea, heartburn or occasional constipation Skin: Denies abnormal skin rashes Lymphatics: Denies new lymphadenopathy or easy bruising Neurological:Denies numbness, tingling or new weaknesses Behavioral/Psych: Mood is stable, no new changes ; anxious when coming to doctor's office All other systems were reviewed with the patient and are negative.  PHYSICAL EXAM:  height is 5' 6"  (1.676 m) and weight is 195 lb 8 oz (88.678 kg). Her oral temperature is 96.9 F (36.1 C). Her blood pressure is 170/83 and her pulse is 81. Her respiration is 20.    GENERAL:alert, no distress and comfortable, very anxious, moderately obese SKIN: skin color, texture, turgor are normal, no rashes or significant lesions EYES: normal, Conjunctiva are pink and non-injected, sclera clear OROPHARYNX:no exudate, no erythema and lips, buccal mucosa, and tongue normal  NECK: supple, thyroid normal size, non-tender, without nodularity LYMPH:  no palpable lymphadenopathy in the cervical, axillary or inguinal LUNGS: clear to auscultation and percussion with normal  breathing effort HEART: regular rate & rhythm and no murmurs and trace ankle edema ABDOMEN:abdomen soft, non-tender and normal bowel sounds Musculoskeletal:no cyanosis of digits and no clubbing NEURO: alert & oriented x 3 with fluent speech, no focal motor/sensory deficits   IMPRESSION: Andrea Lowery is a 73 y.o. female with a history of    PLAN:  1.  MGUS.  --We will plan on collecting a SPEP with immunofixation, UPEP, 24- hour protein. We reviewed her labs demonstrating a monoclonal protein peak accounted for 1.38 g/dL of the total 1.66 g/dL of protein in the gamma region. Her prior CMP demonstrated a creatinine of 0.81 and calcium of 9.0 and total protein of 7.2 and albumin of 3.9;  Her CBC was within normal limits with a hemoglobin of 12.5.   Magnesium was 2.1.  IgG of 2,190 mg/dL, IgA of 81 mg/dL, IgM of 22 mg/dL.  Today's CBC and CMP is within normal limits.  --Based of elevated IgG, she may require a bone marrow biopsy. A bone marrow aspiration and biopsy is indicated in all patients with an M protein ?1.5 g/dL, patients with a non-IgG MGUS of any size, patients with an abnormal serum free light chain ratio (ie, ratio of kappa to lambda free light chains <0.26 or >1.65), and in all patients who have any abnormalities of the complete blood count (CBC), serum creatinine, serum calcium, or radiographic bone survey First, we will perform a surveillance with skeletal survey and we will obtain Kappa/lambda light chains based on above.  --We had an extensive discussion regarding this diagnosis. We informed him that one percent of individuals will progress to Multiple myeloma per year.  She was provided a handout about MGUS.   2. Follow-up. --Patient will follow up in 2 weeks to discuss the above results of her skeletal survey and SPEP, UPEP and to determine if a bone marrow will be required.   All questions were answered. The patient knows to call the clinic with any problems, questions or  concerns. We can certainly see the patient much sooner if necessary.  I spent 40 minutes counseling the patient face to face. The total time spent in the appointment was 60 minutes.    Concha Norway, MD 05/20/2013 11:55 AM

## 2013-05-21 ENCOUNTER — Ambulatory Visit (HOSPITAL_COMMUNITY)
Admission: RE | Admit: 2013-05-21 | Discharge: 2013-05-21 | Disposition: A | Discharge status: Home / Self Care | Payer: Medicare HMO | Source: Ambulatory Visit | Attending: Internal Medicine | Admitting: Internal Medicine

## 2013-05-21 DIAGNOSIS — D472 Monoclonal gammopathy: Secondary | ICD-10-CM | POA: Diagnosis present

## 2013-05-24 LAB — SPEP & IFE WITH QIG
Albumin ELP: 55.1 % — ABNORMAL LOW (ref 55.8–66.1)
Alpha-1-Globulin: 3.4 % (ref 2.9–4.9)
Alpha-2-Globulin: 10.9 % (ref 7.1–11.8)
Beta 2: 3.3 % (ref 3.2–6.5)
Beta Globulin: 5.5 % (ref 4.7–7.2)
Gamma Globulin: 21.8 % — ABNORMAL HIGH (ref 11.1–18.8)
IgA: 82 mg/dL (ref 69–380)
IgG (Immunoglobin G), Serum: 2090 mg/dL — ABNORMAL HIGH (ref 690–1700)
IgM, Serum: 22 mg/dL — ABNORMAL LOW (ref 52–322)
M-Spike, %: 1.41 g/dL
Total Protein, Serum Electrophoresis: 7.8 g/dL (ref 6.0–8.3)

## 2013-05-24 LAB — KAPPA/LAMBDA LIGHT CHAINS
Kappa free light chain: 4.71 mg/dL — ABNORMAL HIGH (ref 0.33–1.94)
Kappa:Lambda Ratio: 3.06 — ABNORMAL HIGH (ref 0.26–1.65)
Lambda Free Lght Chn: 1.54 mg/dL (ref 0.57–2.63)

## 2013-06-03 ENCOUNTER — Ambulatory Visit (HOSPITAL_BASED_OUTPATIENT_CLINIC_OR_DEPARTMENT_OTHER): Payer: Medicaid Other | Admitting: Internal Medicine

## 2013-06-03 ENCOUNTER — Other Ambulatory Visit: Payer: Commercial Managed Care - HMO

## 2013-06-03 ENCOUNTER — Telehealth: Payer: Self-pay | Admitting: Internal Medicine

## 2013-06-03 ENCOUNTER — Encounter: Payer: Self-pay | Admitting: Internal Medicine

## 2013-06-03 VITALS — BP 166/85 | HR 73 | Temp 97.1°F | Resp 18 | Ht 66.0 in | Wt 200.8 lb

## 2013-06-03 DIAGNOSIS — D472 Monoclonal gammopathy: Secondary | ICD-10-CM

## 2013-06-03 LAB — UIFE/LIGHT CHAINS/TP QN, 24-HR UR
Albumin, U: DETECTED
Alpha 1, Urine: DETECTED — AB
Alpha 2, Urine: DETECTED — AB
Beta, Urine: DETECTED — AB
Free Kappa Lt Chains,Ur: 14 mg/dL — ABNORMAL HIGH (ref 0.14–2.42)
Free Kappa/Lambda Ratio: 60.87 ratio — ABNORMAL HIGH (ref 2.04–10.37)
Free Lambda Excretion/Day: 1.84 mg/d
Free Lambda Lt Chains,Ur: 0.23 mg/dL (ref 0.02–0.67)
Free Lt Chn Excr Rate: 112 mg/d
Gamma Globulin, Urine: DETECTED — AB
Time: 24 hours
Total Protein, Urine-Ur/day: 118 mg/d (ref 10–140)
Total Protein, Urine: 14.7 mg/dL
Volume, Urine: 800 mL

## 2013-06-03 NOTE — Progress Notes (Signed)
[place] OFFICE PROGRESS NOTE  [name]., MD [address] [place] [address]  DIAGNOSIS: MGUS (monoclonal gammopathy of unknown significance) - Plan: Ambulatory referral to Social Work, CT Biopsy, CBC with Differential, Basic metabolic panel (Bmet) - [place]  Chief Complaint  Patient presents with  . MGUS (monoclonal gammopathy of unknown significance)    CURRENT TREATMENT: Observation.   INTERVAL HISTORY: Andrea Lowery 73 y.o. female with a history of MGUS is here for follow up.   She was last seen by Dr. Estanislado Pandy on 02/15/2013 for pain and discomfort and few weeks ago. She reviewed her labs. She had DEXA scan and was told that she did not have osteoporosis. She is off Prolia now. Dr. Estanislado Pandy also discussed the labs noted below. She has a long-standing history of multiple arthralgias, left CMC pain, OA of the bilateral knees, hands and feet and osteoporosis for which she is on Prolia previously. She had SPEP drawn on 02/19/2013 that revealed a restricted band with monoclonal protein present. The monoclonal protein peak accounted for 1.38 g/dL of the total 1.66 g/dL of protein in the gamma region. Her CMP demonstrated a creatinine of 0.81 and calcium of 9.0 and total protein of 7.2 and albumin of 3.9; Her CBC was within normal limits with a hemoglobin of 12.5. Magnesium was 2.1. IgG of 2,190 mg/dL, IgA of 81 mg/dL, IgM of 22 mg/dL; She had a normal Parathyroid hormone of 63.6 pg/mL Vitamin D level was mildly low at 26 ng/mL.   Today, she reports that feels fine overall. She still feels very anxious about coming to our office.  She denies any bone pain, fevers or chills.  She also denies any acute shortness of breath. She does report right shoulder pain and knee athralgias.   MEDICAL HISTORY: Past Medical History  Diagnosis Date  . Asthma   . Hypertension   . OSA on CPAP   . Dyslipidemia   . Hypothyroidism   . LBBB (left bundle branch  block)     INTERIM HISTORY: has Essential hypertension; LBBB (left bundle branch block); DOE (dyspnea on exertion); OSA on CPAP; and MGUS (monoclonal gammopathy of unknown significance) on her problem list.    ALLERGIES:  is allergic to penicillins.  MEDICATIONS: has a current medication list which includes the following prescription(s): albuterol, beclomethasone, cyclosporine, diclofenac sodium, diphenhydramine, esomeprazole, fluticasone, furosemide, levocetirizine, levothyroxine, montelukast, olopatadine hcl, rosuvastatin, topiramate, valsartan-hydrochlorothiazide, and vitamin d (ergocalciferol).  SURGICAL HISTORY:  Past Surgical History  Procedure Laterality Date  . Cholecystectomy  1976  . Tonsillectomy  1965  . Abdominal hysterectomy  1969  . Plantar fascia release    . US echocardiography  09/20/2008    borderline LVH,mild TR,AOV mildly sclerotic w/ca+ of the leaflets  . Nm myoview ltd  02/13/2010    No ischemia    REVIEW OF SYSTEMS:   Constitutional: Denies fevers, chills or abnormal weight loss Eyes: Denies blurriness of vision Ears, nose, mouth, throat, and face: Denies mucositis or sore throat Respiratory: Denies cough, dyspnea or wheezes Cardiovascular: Denies palpitation, chest discomfort or lower extremity swelling Gastrointestinal:  Denies nausea, heartburn or change in bowel habits Skin: Denies abnormal skin rashes Lymphatics: Denies new lymphadenopathy or easy bruising Neurological:Denies numbness, tingling or new weaknesses Behavioral/Psych: Mood is stable, no new changes  All other systems were reviewed with the patient and are negative.  PHYSICAL EXAMINATION: ECOG PERFORMANCE STATUS: 0 - Asymptomatic  Blood pressure 166/85, pulse 73, temperature 97.1 F (36.2 C), temperature  source Oral, resp. rate 18, height _0  (1.676 m), weight 200 lb 12.8 oz (91.082 kg).  GENERAL:alert, no distress and comfortable; well developed and well nourished elderly female who  appears younger than her stated age.  SKIN: skin color, texture, turgor are normal, no rashes or significant lesions EYES: normal, Conjunctiva are pink and non-injected, sclera clear OROPHARYNX:no exudate, no erythema and lips, buccal mucosa, and tongue normal  NECK: supple, thyroid normal size, non-tender, without nodularity LYMPH:  no palpable lymphadenopathy in the cervical, axillary or supraclavicular LUNGS: clear to auscultation with normal breathing effort, no wheezes or rhonchi HEART: regular rate & rhythm and no murmurs and no lower extremity edema ABDOMEN:abdomen soft, non-tender and normal bowel sounds Musculoskeletal:no cyanosis of digits and no clubbing  NEURO: alert & oriented x 3 with fluent speech, no focal motor/sensory deficits  Labs:  Lab Results  Component Value Date   WBC 6.8 05/20/2013   HGB 12.8 05/20/2013   HCT 38.6 05/20/2013   MCV 83.9 05/20/2013   PLT 230 05/20/2013   NEUTROABS 3.9 05/20/2013      Chemistry      Component Value Date/Time   NA 139 05/20/2013 1030   NA 140 11/03/2012 1130   K 3.8 05/20/2013 1030   K 3.2* 11/03/2012 1130   CL 107 11/03/2012 1130   CO2 29 05/20/2013 1030   CO2 23 11/03/2012 1130   BUN 10.2 05/20/2013 1030   BUN 8 11/03/2012 1130   CREATININE 0.9 05/20/2013 1030   CREATININE 0.80 11/03/2012 1130      Component Value Date/Time   CALCIUM 9.9 05/20/2013 1030   CALCIUM 9.7 11/03/2012 1130   ALKPHOS 84 05/20/2013 1030   ALKPHOS 101 03/31/2010 0730   AST 20 05/20/2013 1030   AST 27 03/31/2010 0730   ALT 28 05/20/2013 1030   ALT 38* 03/31/2010 0730   BILITOT 0.56 05/20/2013 1030   BILITOT 0.8 03/31/2010 0730     Results for Andrea Lowery, Andrea Lowery (MRN [id number]) as of 06/03/2013 15:36  Ref. Range 05/20/2013 10:31  Albumin ELP Latest Range: 55.8-66.1 % 55.1 (L)  COMMENT (PROTEIN ELECTROPHOR) No range found *  Alpha-1-Globulin Latest Range: 2.9-4.9 % 3.4  Alpha-2-Globulin Latest Range: 7.1-11.8 % 10.9  Beta Globulin Latest Range: 4.7-7.2 % 5.5   Beta 2 Latest Range: 3.2-6.5 % 3.3  Gamma Globulin Latest Range: 11.1-18.8 % 21.8 (H)  M-SPIKE, % No range found 1.41  SPE Interp. No range found *  IgG (Immunoglobin G), Serum Latest Range: 580-806-2146 mg/dL 2090 (H)  IgA Latest Range: 69-380 mg/dL 82  IgM, Serum Latest Range: 52-322 mg/dL 22 (L)  Total Protein, Serum Electrophoresis Latest Range: 6.0-8.3 g/dL 7.8  Kappa free light chain Latest Range: 0.33-1.94 mg/dL 4.71 (H)  Lambda Free Lght Chn Latest Range: 0.57-2.63 mg/dL 1.54  Kappa:Lambda Ratio Latest Range: 0.26-1.65  3.06 (H)   Results for Andrea Lowery, Andrea Lowery (MRN [id number]) as of 06/03/2013 15:36  Ref. Range 05/31/2013 13:28  Time-UPE24 No range found 24  Volume, Urine-UPE24 No range found 800  Total Protein, Urine-UPE24 No range found 14.7  Total Protein, Urine-Ur/day Latest Range: 10-140 mg/day 118  ALBUMIN, U Latest Range: DETECTED  DETECTED  Alpha 1, Urine Latest Range: NONE DET  DETECTED (A)  Alpha 2, Urine Latest Range: NONE DET  DETECTED (A)  Beta, Urine Latest Range: NONE DET  DETECTED (A)  Gamma Globulin, Urine Latest Range: NONE DET  DETECTED (A)  Free Kappa Lt Chains,Ur Latest Range: 0.14-2.42 mg/dL 14.00 (H)  Free Lt Chn Excr Rate No range found 112.00  Free Lambda Lt Chains,Ur Latest Range: 0.02-0.67 mg/dL 0.23  Free Lambda Excretion/Day No range found 1.84  Free Kappa/Lambda Ratio Latest Range: 2.04-10.37 ratio 60.87 (H)   Studies:  No results found.   RADIOGRAPHIC STUDIES: Dg Bone Survey Met  05/21/2013   CLINICAL DATA:  Monoclonal gammopathy of unknown significance.  EXAM: METASTATIC BONE SURVEY  COMPARISON:  None.  FINDINGS: There are no osteolytic or osteoblastic lesions. Bones are diffusely demineralized. Degenerative changes are noted of the mid and lower cervical spine and throughout the thoracic spine. Milder degenerative changes noted in the lower lumbar spine.  Heart, mediastinum and hila are unremarkable. The lungs are clear. There are surgical  staples in the right upper quadrant suggesting a prior cholecystectomy.  IMPRESSION: 1. No osteoblastic or osteolytic lesions. 2. No evidence of multiple myeloma.   Electronically Signed   By: Lajean Manes M.D.   On: 05/21/2013 13:09    ASSESSMENT: Andrea Lowery 73 y.o. female with a history of MGUS (monoclonal gammopathy of unknown significance) - Plan: Ambulatory referral to Social Work, CT Biopsy, CBC with Differential, Basic metabolic panel (Bmet) - [facility]   PLAN:  1. MGUS versus Smothering MM.   --Based on review of her SPEP with immunofixation, UPEP, 24- hour protein, we will obtain a bone marrow biopsy. Today's CBC and CMP is within normal limits. Her skeletal survey is negative. Her IgG is 2.090 with an M-spike of 1.41 g/dL, with a kappa:lambda ratio of 3.06 (which is abnormal).  Based on an abnormal ratio of kappa:lambda and urine abnormal bence proteins, she will require a bone marrow biopsy. A bone marrow aspiration and biopsy is indicated in all patients with an M protein ?1.5 g/dL, patients with a non-IgG MGUS of any size, patients with an abnormal serum free light chain ratio (ie, ratio of kappa to lambda free light chains <0.26 or >1.65), and in all patients who have any abnormalities of the complete blood count (CBC), serum creatinine, serum calcium, or radiographic bone survey.  --We had an extensive discussion regarding this diagnosis. We informed him that one percent of individuals will progress to Multiple myeloma per year. If smothering MM, 10% progress per year for the first 5 years.  She was provided a handout about MGUS.   2. Follow-up.  --Patient will follow up in 3 weeks to discuss the above results of her bone marrow biopsy.   All questions were answered. The patient knows to call the clinic with any problems, questions or concerns. We can certainly see the patient much sooner if necessary.  I spent 10 minutes counseling the patient face to face. The total time spent  in the appointment was 15 minutes.    Concha Norway, MD 06/03/2013 3:42 PM

## 2013-06-03 NOTE — Patient Instructions (Signed)
Monoclonal gammopathy of undetermined significance (MGUS) is a condition in which an abnormal protein (monoclonal protein, or M protein) is in the blood. M protein is produced by plasma cells, a type of white blood cell. Monoclonal gammopathy of undetermined significance usually causes no problems. Sometimes, monoclonal gammopathy of undetermined significance is either associated with another disease or can progress over years to other disorders, including some forms of blood cancer.  If you have monoclonal gammopathy of undetermined significance, you'll usually have periodic checkups to monitor your level of M protein. If there's no increase, monoclonal gammopathy doesn't require treatment.  With close monitoring, if monoclonal gammopathy of undetermined significance does progress, you'll get earlier treatment.  Monoclonal gammopathy of undetermined significance rarely causes signs or symptoms. The condition is usually detected by chance when you have a routine blood test for another problem. However, some people may experience nerve problems, such as numbness or tingling, associated with the abnormal protein.  Monoclonal gammopathy of undetermined significance occurs when plasma cells in your bone marrow produce an abnormal protein called monoclonal protein (M protein). Plasma cells are a type of white blood cell. They are found in your bone marrow. Plasma cells produce some of the antibodies that help your body fight infection. In the majority of people with monoclonal gammopathy of undetermined significance, the protein isn't harmful. But when too much M protein accumulates, it crowds out healthy cells in your bone marrow and can damage other tissues in your body. Genetic changes appear to play a role in monoclonal gammopathy of undetermined significance, as do environmental triggers.  Factors that increase your risk of monoclonal gammopathy of undetermined significance include:  Your age. The risk of  monoclonal gammopathy of undetermined significance increases as you get older. The highest incidence is among adults age 2 and older.  Your race. Blacks are more likely to experience this condition than are whites.  Your sex. Monoclonal gammopathy of undetermined significance is more common in men than it is in women.  A family history. If other people in your family have monoclonal gammopathy of undetermined significance, your risk of developing the disorder may be higher Some people with monoclonal gammopathy of undetermined significance develop a more serious condition, such as multiple myeloma or other cancers or blood disorders.  Doctors can't definitively predict who will go on to develop a more serious condition, but they can determine who has the greatest risk. Your doctor takes into account several factors when determining your risk, including:  The amount of M protein in your blood  The type of M protein  The amount of another small protein (free light chain) in your blood  Your risk of developing a more serious condition increases the longer you've had monoclonal gammopathy of undetermined significance. Also, the more risk factors you have, the higher your risk of developing a more serious condition.  Other complications associated with monoclonal gammopathy of undetermined significance include fractures and blood clots.               Level of Service

## 2013-06-03 NOTE — Telephone Encounter (Signed)
gv pt appt schedule for may. [facility] will call pt re bx appt. pt aware. pt was unsure of why the social work referral was entered and does not feel that she needs to see the SW at this time.

## 2013-06-04 ENCOUNTER — Encounter (HOSPITAL_COMMUNITY): Payer: Self-pay | Admitting: Pharmacy Technician

## 2013-06-07 ENCOUNTER — Encounter: Payer: Self-pay | Admitting: Specialist

## 2013-06-07 NOTE — Telephone Encounter (Signed)
Encounter Closed---06/07/13 TP 

## 2013-06-07 NOTE — Progress Notes (Signed)
Contacted patient because of distress screen. Patient said she is a member of [employer]. She has good support from her church but appreciated the offer of support. Chaplain provided education about support center programs and services.  ONCBCN DISTRESS SCREENING 06/03/2013  Elta Gu[place] the number that describes how much distress you have been experiencing in the past week 7  Emotional problem type Nervousness/Anxiety  Spiritual/Religous concerns type Relating to [place], [facility], PhD Chaplain

## 2013-06-10 ENCOUNTER — Other Ambulatory Visit: Payer: Self-pay | Admitting: Radiology

## 2013-06-10 ENCOUNTER — Other Ambulatory Visit: Payer: Self-pay | Admitting: *Deleted

## 2013-06-10 MED ORDER — ROSUVASTATIN CALCIUM 40 MG PO TABS: 40.0000 mg | ORAL_TABLET | Freq: Every day | ORAL | Status: DC

## 2013-06-10 MED ORDER — VALSARTAN-HYDROCHLOROTHIAZIDE 320-25 MG PO TABS: 1.0000 | ORAL_TABLET | Freq: Every morning | ORAL | Status: DC

## 2013-06-10 NOTE — Telephone Encounter (Signed)
Rx was sent to pharmacy electronically. 

## 2013-06-15 ENCOUNTER — Ambulatory Visit (HOSPITAL_COMMUNITY)
Admission: RE | Admit: 2013-06-15 | Discharge: 2013-06-15 | Disposition: A | Discharge status: Home / Self Care | Payer: Medicare HMO | Source: Ambulatory Visit | Attending: Internal Medicine | Admitting: Internal Medicine

## 2013-06-15 ENCOUNTER — Ambulatory Visit (HOSPITAL_COMMUNITY): Admission: RE | Admit: 2013-06-15 | Payer: Medicare HMO | Source: Ambulatory Visit

## 2013-06-16 ENCOUNTER — Other Ambulatory Visit: Payer: Self-pay | Admitting: Radiology

## 2013-06-17 ENCOUNTER — Other Ambulatory Visit: Payer: Self-pay | Admitting: Radiology

## 2013-06-18 ENCOUNTER — Other Ambulatory Visit: Payer: Self-pay | Admitting: Radiology

## 2013-06-22 ENCOUNTER — Ambulatory Visit (HOSPITAL_COMMUNITY)
Admission: RE | Admit: 2013-06-22 | Discharge: 2013-06-22 | Disposition: A | Discharge status: Home / Self Care | Payer: Medicare HMO | Source: Ambulatory Visit | Attending: Internal Medicine | Admitting: Internal Medicine

## 2013-06-22 ENCOUNTER — Encounter (HOSPITAL_COMMUNITY): Payer: Self-pay

## 2013-06-22 DIAGNOSIS — E039 Hypothyroidism, unspecified: Secondary | ICD-10-CM | POA: Insufficient documentation

## 2013-06-22 DIAGNOSIS — J45909 Unspecified asthma, uncomplicated: Secondary | ICD-10-CM | POA: Insufficient documentation

## 2013-06-22 DIAGNOSIS — I447 Left bundle-branch block, unspecified: Secondary | ICD-10-CM | POA: Insufficient documentation

## 2013-06-22 DIAGNOSIS — E785 Hyperlipidemia, unspecified: Secondary | ICD-10-CM | POA: Diagnosis not present

## 2013-06-22 DIAGNOSIS — Z87891 Personal history of nicotine dependence: Secondary | ICD-10-CM | POA: Insufficient documentation

## 2013-06-22 DIAGNOSIS — E8809 Other disorders of plasma-protein metabolism, not elsewhere classified: Secondary | ICD-10-CM | POA: Insufficient documentation

## 2013-06-22 DIAGNOSIS — Z880 Allergy status to penicillin: Secondary | ICD-10-CM | POA: Insufficient documentation

## 2013-06-22 DIAGNOSIS — G4733 Obstructive sleep apnea (adult) (pediatric): Secondary | ICD-10-CM | POA: Insufficient documentation

## 2013-06-22 DIAGNOSIS — D72822 Plasmacytosis: Secondary | ICD-10-CM | POA: Insufficient documentation

## 2013-06-22 DIAGNOSIS — I10 Essential (primary) hypertension: Secondary | ICD-10-CM | POA: Diagnosis not present

## 2013-06-22 DIAGNOSIS — D472 Monoclonal gammopathy: Secondary | ICD-10-CM

## 2013-06-22 LAB — CBC
HCT: 36.4 % (ref 36.0–46.0)
Hemoglobin: 12.3 g/dL (ref 12.0–15.0)
MCH: 27.8 pg (ref 26.0–34.0)
MCHC: 33.8 g/dL (ref 30.0–36.0)
MCV: 82.2 fL (ref 78.0–100.0)
Platelets: 223 10*3/uL (ref 150–400)
RBC: 4.43 MIL/uL (ref 3.87–5.11)
RDW: 14.9 % (ref 11.5–15.5)
WBC: 5 10*3/uL (ref 4.0–10.5)

## 2013-06-22 LAB — PROTIME-INR
INR: 1 (ref 0.00–1.49)
Prothrombin Time: 13 seconds (ref 11.6–15.2)

## 2013-06-22 LAB — APTT: aPTT: 26 seconds (ref 24–37)

## 2013-06-22 LAB — BONE MARROW EXAM

## 2013-06-22 MED ORDER — SODIUM CHLORIDE 0.9 % IV SOLN
INTRAVENOUS | Status: DC
  Administered 2013-06-22: 07:00:00 via INTRAVENOUS

## 2013-06-22 MED ORDER — FENTANYL CITRATE 0.05 MG/ML IJ SOLN
INTRAMUSCULAR | Status: AC | PRN
  Administered 2013-06-22 (×4): 25 ug via INTRAVENOUS

## 2013-06-22 MED ORDER — FENTANYL CITRATE 0.05 MG/ML IJ SOLN
INTRAMUSCULAR | Status: AC
  Filled 2013-06-22: qty 6

## 2013-06-22 MED ORDER — MIDAZOLAM HCL 2 MG/2ML IJ SOLN
INTRAMUSCULAR | Status: AC
  Filled 2013-06-22: qty 6

## 2013-06-22 MED ORDER — HYDROCODONE-ACETAMINOPHEN 5-325 MG PO TABS
1.0000 | ORAL_TABLET | ORAL | Status: DC | PRN
  Filled 2013-06-22: qty 2

## 2013-06-22 MED ORDER — MIDAZOLAM HCL 2 MG/2ML IJ SOLN
INTRAMUSCULAR | Status: AC | PRN
  Administered 2013-06-22 (×4): 1 mg via INTRAVENOUS

## 2013-06-22 NOTE — Procedures (Signed)
Interventional Radiology Procedure Note  Procedure: CT guided aspirate and core biopsy of right iliac bone Complications: None Recommendations: - Bedrest supine x 2 hrs - Hydrocodone PRN  Pain - Follow biopsy results  Signed,  Heath K. McCullough, MD [facility]   

## 2013-06-22 NOTE — Discharge Instructions (Signed)
Moderate Sedation, Adult °Moderate sedation is given to help you relax or even sleep through a procedure. You may remain sleepy, be clumsy, or have poor balance for several hours following this procedure. Arrange for a responsible adult, family member, or friend to take you home. A responsible adult should stay with you for at least 24 hours or until the medicines have worn off. °· Do not participate in any activities where you could become injured for the next 24 hours, or until you feel normal again. Do not: °· Drive. °· Swim. °· Ride a bicycle. °· Operate heavy machinery. °· Cook. °· Use power tools. °· Climb ladders. °· Work at heights. °· Do not make important decisions or sign legal documents until you are improved. °· Vomiting may occur if you eat too soon. When you can drink without vomiting, try water, juice, or soup. Try solid foods if you feel little or no nausea. °· Only take over-the-counter or prescription medications for pain, discomfort, or fever as directed by your caregiver.If pain medications have been prescribed for you, ask your caregiver how soon it is safe to take them. °· Make sure you and your family fully understands everything about the medication given to you. Make sure you understand what side effects may occur. °· You should not drink alcohol, take sleeping pills, or medications that cause drowsiness for at least 24 hours. °· If you smoke, do not smoke alone. °· If you are feeling better, you may resume normal activities 24 hours after receiving sedation. °· Keep all appointments as scheduled. Follow all instructions. °· Ask questions if you do not understand. °SEEK MEDICAL CARE IF:  °· Your skin is pale or bluish in color. °· You continue to feel sick to your stomach (nauseous) or throw up (vomit). °· Your pain is getting worse and not helped by medication. °· You have bleeding or swelling. °· You are still sleepy or feeling clumsy after 24 hours. °SEEK IMMEDIATE MEDICAL CARE IF:   °· You develop a rash. °· You have difficulty breathing. °· You develop any type of allergic problem. °· You have a fever. °Document Released: 10/16/2000 Document Revised: 04/15/2011 Document Reviewed: 09/28/2012 °ExitCare® Patient Information ©2014 [employer]. °Bone Marrow Aspiration, Bone Marrow Biopsy °Care After °Read the instructions outlined below and refer to this sheet in the next few weeks. These discharge instructions provide you with general information on caring for yourself after you leave the hospital. Your caregiver may also give you specific instructions. While your treatment has been planned according to the most current medical practices available, unavoidable complications occasionally occur. If you have any problems or questions after discharge, call your caregiver. °FINDING OUT THE RESULTS OF YOUR TEST °Not all test results are available during your visit. If your test results are not back during the visit, make an appointment with your caregiver to find out the results. Do not assume everything is normal if you have not heard from your caregiver or the medical facility. It is important for you to follow up on all of your test results.  °HOME CARE INSTRUCTIONS  °You have had sedation and may be sleepy or dizzy. Your thinking may not be as clear as usual. For the next 24 hours: °· Only take over-the-counter or prescription medicines for pain, discomfort, and or fever as directed by your caregiver. °· Do not drink alcohol. °· Do not smoke. °· Do not drive. °· Do not make important legal decisions. °· Do not operate heavy machinery. °·   Do not care for small children by yourself. °· Keep your dressing clean and dry. You may replace dressing with a bandage after 24 hours. °· You may take a bath or shower after 24 hours. °· Use an ice pack for 20 minutes every 2 hours while awake for pain as needed. °SEEK MEDICAL CARE IF:  °· There is redness, swelling, or increasing pain at the biopsy  site. °· There is pus coming from the biopsy site. °· There is drainage from a biopsy site lasting longer than one day. °· An unexplained oral temperature above 102° F (38.9° C) develops. °SEEK IMMEDIATE MEDICAL CARE IF:  °· You develop a rash. °· You have difficulty breathing. °· You develop any reaction or side effects to medications given. °Document Released: 08/10/2004 Document Revised: 04/15/2011 Document Reviewed: 01/19/2008 °ExitCare® Patient Information ©2014 [employer]. ° °

## 2013-06-22 NOTE — H&P (Signed)
Chief Complaint: "I'm here for a bone marrow biopsy" Referring Physician:Chism HPI: Andrea Lowery is an 73 y.o. female with monoclonal gammopathy. She is referred for bone marrow biopsy. PMHx and meds reviewed. Pt feels ok this morning.  Past Medical History:  Past Medical History  Diagnosis Date  . Asthma   . Hypertension   . OSA on CPAP   . Dyslipidemia   . Hypothyroidism   . LBBB (left bundle branch block)     Past Surgical History:  Past Surgical History  Procedure Laterality Date  . Cholecystectomy  1976  . Tonsillectomy  1965  . Abdominal hysterectomy  1969  . Plantar fascia release    . US echocardiography  09/20/2008    borderline LVH,mild TR,AOV mildly sclerotic w/ca+ of the leaflets  . Nm myoview ltd  02/13/2010    No ischemia    Family History:  Family History  Problem Relation Age of Onset  . Diabetes Father   . Heart failure Father     Social History:  reports that she quit smoking about 7 years ago. She does not have any smokeless tobacco history on file. She reports that she does not drink alcohol or use illicit drugs.  Allergies:  Allergies  Allergen Reactions  . Penicillins Hives    Medications:   Medication List    ASK your doctor about these medications       albuterol 108 (90 BASE) MCG/ACT inhaler  Commonly known as:  PROVENTIL HFA;VENTOLIN HFA  Inhale 2 puffs into the lungs every 4 (four) hours as needed. For shortness of breath and wheezing     beclomethasone 80 MCG/ACT inhaler  Commonly known as:  QVAR  Inhale 2 puffs into the lungs 2 (two) times daily.     cycloSPORINE 0.05 % ophthalmic emulsion  Commonly known as:  RESTASIS  Place 1 drop into both eyes 2 (two) times daily.     esomeprazole 40 MG capsule  Commonly known as:  NEXIUM  Take 40 mg by mouth daily before breakfast.     fluticasone 50 MCG/ACT nasal spray  Commonly known as:  FLONASE  Place 1 spray into both nostrils daily as needed for allergies or rhinitis.     furosemide 20 MG tablet  Commonly known as:  LASIX  Take 20 mg by mouth 2 (two) times daily.     levocetirizine 5 MG tablet  Commonly known as:  XYZAL  Take 5 mg by mouth every evening.     montelukast 10 MG tablet  Commonly known as:  SINGULAIR  Take 10 mg by mouth at bedtime.     PATADAY 0.2 % Soln  Generic drug:  Olopatadine HCl  Apply 1 drop to eye daily as needed (Allergies).     rosuvastatin 40 MG tablet  Commonly known as:  CRESTOR  Take 1 tablet (40 mg total) by mouth daily. <please schedule appointment with Dr. Debara Pickett     topiramate 50 MG tablet  Commonly known as:  TOPAMAX  Take 50 mg by mouth 2 (two) times daily as needed (migraines).     valsartan-hydrochlorothiazide 320-25 MG per tablet  Commonly known as:  DIOVAN-HCT  Take 1 tablet by mouth every morning. <please make appointment with Dr. Debara Pickett     VOLTAREN 1 % Gel  Generic drug:  diclofenac sodium  Apply 2 g topically daily as needed (Knee pain).        Please HPI for pertinent positives, otherwise complete 10 system ROS negative.  Physical Exam: BP 158/85  Pulse 67  Temp(Src) 98 F (36.7 C) (Oral)  Resp 18  SpO2 98% There is no weight on file to calculate BMI.   General Appearance:  Alert, cooperative, no distress, appears stated age  Head:  Normocephalic, without obvious abnormality, atraumatic  ENT: Unremarkable  Neck: Supple, symmetrical, trachea midline  Lungs:   Clear to auscultation bilaterally, no w/r/r, respirations unlabored without use of accessory muscles.  Chest Wall:  No tenderness or deformity  Heart:  Regular rate and rhythm, S1, S2 normal, no murmur, rub or gallop.  Abdomen:   Soft, non-tender, non distended.  Neurologic: Normal affect, no gross deficits.   Results for orders placed during the hospital encounter of 06/22/13 (from the past 48 hour(s))  APTT     Status: None   Collection Time    06/22/13  7:20 AM      Result Value Ref Range   aPTT 26  24 - 37 seconds  CBC      Status: None   Collection Time    06/22/13  7:20 AM      Result Value Ref Range   WBC 5.0  4.0 - 10.5 K/uL   RBC 4.43  3.87 - 5.11 MIL/uL   Hemoglobin 12.3  12.0 - 15.0 g/dL   HCT 36.4  36.0 - 46.0 %   MCV 82.2  78.0 - 100.0 fL   MCH 27.8  26.0 - 34.0 pg   MCHC 33.8  30.0 - 36.0 g/dL   RDW 14.9  11.5 - 15.5 %   Platelets 223  150 - 400 K/uL  PROTIME-INR     Status: None   Collection Time    06/22/13  7:20 AM      Result Value Ref Range   Prothrombin Time 13.0  11.6 - 15.2 seconds   INR 1.00  0.00 - 1.49   No results found.  Assessment/Plan MGUS For CT guided bone marrow biopsy Procedure, risks, complications, use of sedation reviewed. Labs reviewed. Consent signed in chart  Ascencion Dike PA-C 06/22/2013, 8:30 AM

## 2013-06-24 ENCOUNTER — Encounter: Payer: Self-pay | Admitting: Internal Medicine

## 2013-06-24 ENCOUNTER — Telehealth: Payer: Self-pay | Admitting: Internal Medicine

## 2013-06-24 ENCOUNTER — Other Ambulatory Visit (HOSPITAL_BASED_OUTPATIENT_CLINIC_OR_DEPARTMENT_OTHER): Payer: Medicare HMO

## 2013-06-24 ENCOUNTER — Ambulatory Visit (HOSPITAL_BASED_OUTPATIENT_CLINIC_OR_DEPARTMENT_OTHER): Payer: Medicare HMO | Admitting: Internal Medicine

## 2013-06-24 VITALS — BP 159/87 | HR 73 | Temp 97.2°F | Resp 20 | Ht 66.0 in | Wt 198.0 lb

## 2013-06-24 DIAGNOSIS — D472 Monoclonal gammopathy: Secondary | ICD-10-CM

## 2013-06-24 DIAGNOSIS — F411 Generalized anxiety disorder: Secondary | ICD-10-CM

## 2013-06-24 LAB — CBC WITH DIFFERENTIAL/PLATELET
BASO%: 0.7 % (ref 0.0–2.0)
Basophils Absolute: 0 10*3/uL (ref 0.0–0.1)
EOS%: 3 % (ref 0.0–7.0)
Eosinophils Absolute: 0.2 10*3/uL (ref 0.0–0.5)
HCT: 38 % (ref 34.8–46.6)
HGB: 12.5 g/dL (ref 11.6–15.9)
LYMPH%: 34.6 % (ref 14.0–49.7)
MCH: 27.4 pg (ref 25.1–34.0)
MCHC: 33 g/dL (ref 31.5–36.0)
MCV: 83.1 fL (ref 79.5–101.0)
MONO#: 0.6 10*3/uL (ref 0.1–0.9)
MONO%: 10.2 % (ref 0.0–14.0)
NEUT#: 3 10*3/uL (ref 1.5–6.5)
NEUT%: 51.5 % (ref 38.4–76.8)
Platelets: 213 10*3/uL (ref 145–400)
RBC: 4.57 10*6/uL (ref 3.70–5.45)
RDW: 15.1 % — ABNORMAL HIGH (ref 11.2–14.5)
WBC: 5.8 10*3/uL (ref 3.9–10.3)
lymph#: 2 10*3/uL (ref 0.9–3.3)

## 2013-06-24 LAB — BASIC METABOLIC PANEL (CC13)
Anion Gap: 10 mEq/L (ref 3–11)
BUN: 8.2 mg/dL (ref 7.0–26.0)
CO2: 24 mEq/L (ref 22–29)
Calcium: 9.2 mg/dL (ref 8.4–10.4)
Chloride: 106 mEq/L (ref 98–109)
Creatinine: 0.9 mg/dL (ref 0.6–1.1)
Glucose: 143 mg/dl — ABNORMAL HIGH (ref 70–140)
Potassium: 3.1 mEq/L — ABNORMAL LOW (ref 3.5–5.1)
Sodium: 140 mEq/L (ref 136–145)

## 2013-06-24 NOTE — Progress Notes (Signed)
[place] OFFICE PROGRESS NOTE  Andrea Lowery., MD [address] [place] [address]  DIAGNOSIS: MGUS (monoclonal gammopathy of unknown significance) - Plan: CBC with Differential, Comprehensive metabolic panel (Cmet) - [facility], Kappa/lambda light chains, SPEP & IFE with QIG  Chief Complaint  Patient presents with  . MGUS (monoclonal gammopathy of unknown significance)    CURRENT TREATMENT: Observation.   INTERVAL HISTORY: Andrea Lowery 73 y.o. female with a history of MGUS is here for follow up. She was last seen by me on 06/03/2013.  She was last seen by Dr. Estanislado Lowery on 02/15/2013 for pain and discomfort and few weeks ago. She reviewed her labs. She had DEXA scan and was told that she did not have osteoporosis. She is off Prolia now. Dr. Estanislado Lowery also discussed the labs noted below. She has a long-standing history of multiple arthralgias, left CMC pain, OA of the bilateral knees, hands and feet and osteoporosis for which she is on Prolia previously. She had SPEP drawn on 02/19/2013 that revealed a restricted band with monoclonal protein present. The monoclonal protein peak accounted for 1.38 g/dL of the total 1.66 g/dL of protein in the gamma region. Her CMP demonstrated a creatinine of 0.81 and calcium of 9.0 and total protein of 7.2 and albumin of 3.9; Her CBC was within normal limits with a hemoglobin of 12.5. Magnesium was 2.1. IgG of 2,190 mg/dL, IgA of 81 mg/dL, IgM of 22 mg/dL; She had a normal Parathyroid hormone of 63.6 pg/mL Vitamin D level was mildly low at 26 ng/mL.   Today, she reports that feels fine overall. She still feels very anxious about coming to our office.  She denies any bone pain, fevers or chills.  She also denies any acute shortness of breath. She had a bone marrow biopsy on 06/22/2013.  MEDICAL HISTORY: Past Medical History  Diagnosis Date  . Asthma   . Hypertension   . OSA on CPAP   . Dyslipidemia   . Hypothyroidism    . LBBB (left bundle branch block)     INTERIM HISTORY: has Essential hypertension; LBBB (left bundle branch block); DOE (dyspnea on exertion); OSA on CPAP; and MGUS (monoclonal gammopathy of unknown significance) on her problem list.    ALLERGIES:  is allergic to penicillins.  MEDICATIONS: has a current medication list which includes the following prescription(s): albuterol, beclomethasone, cyclosporine, diclofenac sodium, esomeprazole, fluticasone, furosemide, levocetirizine, montelukast, olopatadine hcl, rosuvastatin, topiramate, and valsartan-hydrochlorothiazide.  SURGICAL HISTORY:  Past Surgical History  Procedure Laterality Date  . Cholecystectomy  1976  . Tonsillectomy  1965  . Abdominal hysterectomy  1969  . Plantar fascia release    . US echocardiography  09/20/2008    borderline LVH,mild TR,AOV mildly sclerotic w/ca+ of the leaflets  . Nm myoview ltd  02/13/2010    No ischemia    REVIEW OF SYSTEMS:   Constitutional: Denies fevers, chills or abnormal weight loss Eyes: Denies blurriness of vision Ears, nose, mouth, throat, and face: Denies mucositis or sore throat Respiratory: Denies cough, dyspnea or wheezes Cardiovascular: Denies palpitation, chest discomfort or lower extremity swelling Gastrointestinal:  Denies nausea, heartburn or change in bowel habits Skin: Denies abnormal skin rashes Lymphatics: Denies new lymphadenopathy or easy bruising Neurological:Denies numbness, tingling or new weaknesses Behavioral/Psych: Mood is stable, no new changes  All other systems were reviewed with the patient and are negative.  PHYSICAL EXAMINATION: ECOG PERFORMANCE STATUS: 0 - Asymptomatic  Blood pressure 159/87, pulse 73, temperature 97.2 F (36.2 C),  temperature source Oral, resp. rate 20, height 5' 6"  (1.676 m), weight 198 lb (89.812 kg).  GENERAL:alert, no distress and comfortable; well developed and well nourished elderly female who appears younger than her stated age.   SKIN: skin color, texture, turgor are normal, no rashes or significant lesions EYES: normal, Conjunctiva are pink and non-injected, sclera clear OROPHARYNX:no exudate, no erythema and lips, buccal mucosa, and tongue normal  NECK: supple, thyroid normal size, non-tender, without nodularity LYMPH:  no palpable lymphadenopathy in the cervical, axillary or supraclavicular LUNGS: clear to auscultation with normal breathing effort, no wheezes or rhonchi HEART: regular rate & rhythm and no murmurs and no lower extremity edema ABDOMEN:abdomen soft, non-tender and normal bowel sounds Musculoskeletal:no cyanosis of digits and no clubbing  NEURO: alert & oriented x 3 with fluent speech, no focal motor/sensory deficits  Labs:  Lab Results  Component Value Date   WBC 5.8 06/24/2013   HGB 12.5 06/24/2013   HCT 38.0 06/24/2013   MCV 83.1 06/24/2013   PLT 213 06/24/2013   NEUTROABS 3.0 06/24/2013      Chemistry      Component Value Date/Time   NA 140 06/24/2013 1046   NA 140 11/03/2012 1130   K 3.1* 06/24/2013 1046   K 3.2* 11/03/2012 1130   CL 107 11/03/2012 1130   CO2 24 06/24/2013 1046   CO2 23 11/03/2012 1130   BUN 8.2 06/24/2013 1046   BUN 8 11/03/2012 1130   CREATININE 0.9 06/24/2013 1046   CREATININE 0.80 11/03/2012 1130      Component Value Date/Time   CALCIUM 9.2 06/24/2013 1046   CALCIUM 9.7 11/03/2012 1130   ALKPHOS 84 05/20/2013 1030   ALKPHOS 101 03/31/2010 0730   AST 20 05/20/2013 1030   AST 27 03/31/2010 0730   ALT 28 05/20/2013 1030   ALT 38* 03/31/2010 0730   BILITOT 0.56 05/20/2013 1030   BILITOT 0.8 03/31/2010 0730      Studies:  No results found.   RADIOGRAPHIC STUDIES: Dg Bone Survey Met  05/21/2013   CLINICAL DATA:  Monoclonal gammopathy of unknown significance.  EXAM: METASTATIC BONE SURVEY  COMPARISON:  None.  FINDINGS: There are no osteolytic or osteoblastic lesions. Bones are diffusely demineralized. Degenerative changes are noted of the mid and lower cervical spine and  throughout the thoracic spine. Milder degenerative changes noted in the lower lumbar spine.  Heart, mediastinum and hila are unremarkable. The lungs are clear. There are surgical staples in the right upper quadrant suggesting a prior cholecystectomy.  IMPRESSION: 1. No osteoblastic or osteolytic lesions. 2. No evidence of multiple myeloma.   Electronically Signed   By: Lajean Manes M.D.   On: 05/21/2013 13:09   PATHOLOGY: 06/22/2013  Diagnosis Bone Marrow, Aspirate,Biopsy, and Clot, right iliac - NORMOCELLULAR MARROW WITH MONOCLONAL PLASMACYTOSIS (10%). - SEE COMMENT. PERIPHERAL BLOOD: - UNREMARKABLE. Diagnosis Note Overall the bone marrow is normocellular, but displays a monoclonal plasmacytosis. By immunohistochemistry and manual differential counts there are approximately 10% plasma cells, which are kappa restricted. These findings are consistent with a plasma cell neoplasm. Correlation with clinical, radiographic, and laboratory data is required for further classification. Vicente Males MD Pathologist, Electronic Signature (Case signed 06/24/2013)  ASSESSMENT: Andrea Lowery 73 y.o. female with a history of MGUS (monoclonal gammopathy of unknown significance) - Plan: CBC with Differential, Comprehensive metabolic panel (Cmet) - [facility], Kappa/lambda light chains, SPEP & IFE with QIG   PLAN:  1.  Smothering IgG kappa MM.   --Based on review  of her SPEP with immunofixation, UPEP, 24- hour protein, we obtained a bone marrow biopsy demonstrating 10% plasma cells. Today's CBC and CMP is within normal limits. Her skeletal survey is negative. Her IgG is 2.090 with an M-spike of 1.41 g/dL, with a kappa:lambda ratio of 3.06 (which is abnormal).  She meets criteria for smothering MM with a risk of progression 10% per year for first 5 years.  We will see her every 3 months for labs including MM and chemistries and CBC.  She has a normal calcium, creatinine and hemoglobin.  2. Follow-up.   --Patient will follow up in 3 months for repeat labs.   All questions were answered. The patient knows to call the clinic with any problems, questions or concerns. We can certainly see the patient much sooner if necessary.  I spent 10 minutes counseling the patient face to face. The total time spent in the appointment was 15 minutes.    Concha Norway, MD 06/25/2013 6:48 AM

## 2013-06-24 NOTE — Telephone Encounter (Signed)
, °

## 2013-07-01 LAB — CHROMOSOME ANALYSIS, BONE MARROW

## 2013-07-06 ENCOUNTER — Telehealth: Payer: Self-pay

## 2013-07-06 NOTE — Telephone Encounter (Signed)
[employer] dated 06/22/13 received and forwarded to Dr Juliann Mule

## 2013-07-07 ENCOUNTER — Emergency Department (HOSPITAL_COMMUNITY)
Admission: EM | Admit: 2013-07-07 | Discharge: 2013-07-07 | Disposition: A | Discharge status: Home / Self Care | Payer: Medicare HMO | Attending: Emergency Medicine | Admitting: Emergency Medicine

## 2013-07-07 ENCOUNTER — Emergency Department (INDEPENDENT_AMBULATORY_CARE_PROVIDER_SITE_OTHER): Payer: Medicare HMO

## 2013-07-07 ENCOUNTER — Emergency Department (INDEPENDENT_AMBULATORY_CARE_PROVIDER_SITE_OTHER)
Admission: EM | Admit: 2013-07-07 | Discharge: 2013-07-07 | Disposition: A | Discharge status: Nursing Home (Includes ICF) | Payer: Commercial Managed Care - HMO | Source: Home / Self Care

## 2013-07-07 ENCOUNTER — Encounter (HOSPITAL_COMMUNITY): Payer: Self-pay | Admitting: Emergency Medicine

## 2013-07-07 DIAGNOSIS — Z79899 Other long term (current) drug therapy: Secondary | ICD-10-CM | POA: Insufficient documentation

## 2013-07-07 DIAGNOSIS — E785 Hyperlipidemia, unspecified: Secondary | ICD-10-CM | POA: Insufficient documentation

## 2013-07-07 DIAGNOSIS — Z880 Allergy status to penicillin: Secondary | ICD-10-CM | POA: Insufficient documentation

## 2013-07-07 DIAGNOSIS — IMO0002 Reserved for concepts with insufficient information to code with codable children: Secondary | ICD-10-CM | POA: Insufficient documentation

## 2013-07-07 DIAGNOSIS — I10 Essential (primary) hypertension: Secondary | ICD-10-CM | POA: Insufficient documentation

## 2013-07-07 DIAGNOSIS — Z87891 Personal history of nicotine dependence: Secondary | ICD-10-CM | POA: Insufficient documentation

## 2013-07-07 DIAGNOSIS — R0602 Shortness of breath: Secondary | ICD-10-CM

## 2013-07-07 DIAGNOSIS — R609 Edema, unspecified: Secondary | ICD-10-CM | POA: Insufficient documentation

## 2013-07-07 DIAGNOSIS — J45901 Unspecified asthma with (acute) exacerbation: Secondary | ICD-10-CM | POA: Insufficient documentation

## 2013-07-07 DIAGNOSIS — Z9981 Dependence on supplemental oxygen: Secondary | ICD-10-CM | POA: Insufficient documentation

## 2013-07-07 DIAGNOSIS — E039 Hypothyroidism, unspecified: Secondary | ICD-10-CM | POA: Insufficient documentation

## 2013-07-07 DIAGNOSIS — J45909 Unspecified asthma, uncomplicated: Secondary | ICD-10-CM

## 2013-07-07 DIAGNOSIS — G4733 Obstructive sleep apnea (adult) (pediatric): Secondary | ICD-10-CM | POA: Insufficient documentation

## 2013-07-07 LAB — URINALYSIS, ROUTINE W REFLEX MICROSCOPIC
Bilirubin Urine: NEGATIVE
Glucose, UA: NEGATIVE mg/dL
Hgb urine dipstick: NEGATIVE
Ketones, ur: NEGATIVE mg/dL
Leukocytes, UA: NEGATIVE
Nitrite: NEGATIVE
Protein, ur: NEGATIVE mg/dL
Specific Gravity, Urine: 1.012 (ref 1.005–1.030)
Urobilinogen, UA: 0.2 mg/dL (ref 0.0–1.0)
pH: 5 (ref 5.0–8.0)

## 2013-07-07 LAB — BASIC METABOLIC PANEL
BUN: 10 mg/dL (ref 6–23)
CO2: 24 mEq/L (ref 19–32)
Calcium: 9.6 mg/dL (ref 8.4–10.5)
Chloride: 103 mEq/L (ref 96–112)
Creatinine, Ser: 0.87 mg/dL (ref 0.50–1.10)
GFR calc Af Amer: 76 mL/min — ABNORMAL LOW (ref >90)
GFR calc non Af Amer: 65 mL/min — ABNORMAL LOW (ref >90)
Glucose, Bld: 141 mg/dL — ABNORMAL HIGH (ref 70–99)
Potassium: 3.3 mEq/L — ABNORMAL LOW (ref 3.7–5.3)
Sodium: 141 mEq/L (ref 137–147)

## 2013-07-07 LAB — I-STAT TROPONIN, ED: Troponin i, poc: 0 ng/mL (ref 0.00–0.08)

## 2013-07-07 LAB — CBC
HCT: 36.1 % (ref 36.0–46.0)
Hemoglobin: 12.6 g/dL (ref 12.0–15.0)
MCH: 29 pg (ref 26.0–34.0)
MCHC: 34.9 g/dL (ref 30.0–36.0)
MCV: 83 fL (ref 78.0–100.0)
Platelets: 228 10*3/uL (ref 150–400)
RBC: 4.35 MIL/uL (ref 3.87–5.11)
RDW: 15 % (ref 11.5–15.5)
WBC: 6.9 10*3/uL (ref 4.0–10.5)

## 2013-07-07 LAB — PRO B NATRIURETIC PEPTIDE: Pro B Natriuretic peptide (BNP): 170.7 pg/mL — ABNORMAL HIGH (ref 0–125)

## 2013-07-07 MED ORDER — ALBUTEROL SULFATE (2.5 MG/3ML) 0.083% IN NEBU
5.0000 mg | INHALATION_SOLUTION | Freq: Once | RESPIRATORY_TRACT | Status: AC
  Administered 2013-07-07: 5 mg via RESPIRATORY_TRACT

## 2013-07-07 MED ORDER — METHYLPREDNISOLONE SODIUM SUCC 125 MG IJ SOLR
INTRAMUSCULAR | Status: AC
  Filled 2013-07-07: qty 2

## 2013-07-07 MED ORDER — IPRATROPIUM BROMIDE 0.02 % IN SOLN
RESPIRATORY_TRACT | Status: AC
  Filled 2013-07-07: qty 2.5

## 2013-07-07 MED ORDER — PREDNISONE 20 MG PO TABS: ORAL_TABLET | ORAL | Status: DC

## 2013-07-07 MED ORDER — IPRATROPIUM-ALBUTEROL 0.5-2.5 (3) MG/3ML IN SOLN
3.0000 mL | Freq: Once | RESPIRATORY_TRACT | Status: AC
  Administered 2013-07-07: 3 mL via RESPIRATORY_TRACT
  Filled 2013-07-07: qty 3

## 2013-07-07 MED ORDER — IPRATROPIUM-ALBUTEROL 0.5-2.5 (3) MG/3ML IN SOLN: 3.0000 mL | Freq: Once | RESPIRATORY_TRACT | Status: DC

## 2013-07-07 MED ORDER — IPRATROPIUM BROMIDE 0.02 % IN SOLN
0.5000 mg | Freq: Once | RESPIRATORY_TRACT | Status: AC
  Administered 2013-07-07: 0.5 mg via RESPIRATORY_TRACT

## 2013-07-07 MED ORDER — BENZONATATE 100 MG PO CAPS: 100.0000 mg | ORAL_CAPSULE | Freq: Three times a day (TID) | ORAL | Status: DC

## 2013-07-07 MED ORDER — AZITHROMYCIN 250 MG PO TABS: 1000.0000 mg | ORAL_TABLET | Freq: Once | ORAL | Status: DC

## 2013-07-07 MED ORDER — ALBUTEROL SULFATE (2.5 MG/3ML) 0.083% IN NEBU
INHALATION_SOLUTION | RESPIRATORY_TRACT | Status: AC
  Filled 2013-07-07: qty 6

## 2013-07-07 MED ORDER — METHYLPREDNISOLONE SODIUM SUCC 125 MG IJ SOLR
125.0000 mg | Freq: Once | INTRAMUSCULAR | Status: AC
  Administered 2013-07-07: 125 mg via INTRAMUSCULAR

## 2013-07-07 MED ORDER — METHYLPREDNISOLONE SODIUM SUCC 125 MG IJ SOLR
60.0000 mg | Freq: Once | INTRAMUSCULAR | Status: DC
Start: 2013-07-07 — End: 2013-07-07

## 2013-07-07 MED ORDER — PREDNISONE 20 MG PO TABS
60.0000 mg | ORAL_TABLET | Freq: Once | ORAL | Status: AC
  Administered 2013-07-07: 60 mg via ORAL
  Filled 2013-07-07: qty 3

## 2013-07-07 NOTE — ED Provider Notes (Signed)
CSN: [id number]     Arrival date & time 07/07/13  1625 History   None    Chief Complaint  Patient presents with  . Wheezing   (Consider location/radiation/quality/duration/timing/severity/associated sxs/prior Treatment) Patient is a 73 y.o. female presenting with wheezing. The history is provided by the patient.  Wheezing Severity:  Moderate Severity compared to prior episodes:  More severe Onset quality:  Sudden Duration:  12 hours Progression:  Worsening Chronicity:  Chronic Context: exposure to allergen   Ineffective treatments:  Beta-agonist inhaler Associated symptoms: cough, rhinorrhea, shortness of breath and sputum production   Associated symptoms: no chest pain     Past Medical History  Diagnosis Date  . Asthma   . Hypertension   . OSA on CPAP   . Dyslipidemia   . Hypothyroidism   . LBBB (left bundle branch block)    Past Surgical History  Procedure Laterality Date  . Cholecystectomy  1976  . Tonsillectomy  1965  . Abdominal hysterectomy  1969  . Plantar fascia release    . US echocardiography  09/20/2008    borderline LVH,mild TR,AOV mildly sclerotic w/ca+ of the leaflets  . Nm myoview ltd  02/13/2010    No ischemia   Family History  Problem Relation Age of Onset  . Diabetes Father   . Heart failure Father    History  Substance Use Topics  . Smoking status: Former Smoker    Quit date: 02/04/2006  . Smokeless tobacco: Not on file  . Alcohol Use: No   OB History   Grav Para Term Preterm Abortions TAB SAB Ect Mult Living                 Review of Systems  Constitutional: Negative.   HENT: Positive for congestion, postnasal drip and rhinorrhea.   Respiratory: Positive for cough, sputum production, shortness of breath and wheezing.   Cardiovascular: Negative for chest pain.    Allergies  Penicillins  Home Medications   Prior to Admission medications   Medication Sig Start Date End Date Taking? Authorizing Provider  albuterol (PROVENTIL  HFA;VENTOLIN HFA) 108 (90 BASE) MCG/ACT inhaler Inhale 2 puffs into the lungs every 4 (four) hours as needed. For shortness of breath and wheezing    Historical Provider, MD  beclomethasone (QVAR) 80 MCG/ACT inhaler Inhale 2 puffs into the lungs 2 (two) times daily.    Historical Provider, MD  cycloSPORINE (RESTASIS) 0.05 % ophthalmic emulsion Place 1 drop into both eyes 2 (two) times daily.     Historical Provider, MD  diclofenac sodium (VOLTAREN) 1 % GEL Apply 2 g topically daily as needed (Knee pain).     Historical Provider, MD  esomeprazole (NEXIUM) 40 MG capsule Take 40 mg by mouth daily before breakfast.      Historical Provider, MD  fluticasone (FLONASE) 50 MCG/ACT nasal spray Place 1 spray into both nostrils daily as needed for allergies or rhinitis.    Historical Provider, MD  furosemide (LASIX) 20 MG tablet Take 20 mg by mouth 2 (two) times daily.    Historical Provider, MD  levocetirizine (XYZAL) 5 MG tablet Take 5 mg by mouth every evening.    Historical Provider, MD  montelukast (SINGULAIR) 10 MG tablet Take 10 mg by mouth at bedtime.    Historical Provider, MD  Olopatadine HCl (PATADAY) 0.2 % SOLN Apply 1 drop to eye daily as needed (Allergies).     Historical Provider, MD  rosuvastatin (CRESTOR) 40 MG tablet Take 1 tablet (40 mg total)  by mouth daily. <please schedule appointment with Dr. Debara Pickett 06/10/13   Pixie Casino, MD  topiramate (TOPAMAX) 50 MG tablet Take 50 mg by mouth 2 (two) times daily as needed (migraines).     Historical Provider, MD  valsartan-hydrochlorothiazide (DIOVAN-HCT) 320-25 MG per tablet Take 1 tablet by mouth every morning. <please make appointment with Dr. Debara Pickett 06/10/13   Pixie Casino, MD   BP 156/81  Pulse 67  Temp(Src) 97.6 F (36.4 C) (Oral)  Resp 16  SpO2 98% Physical Exam  Nursing note and vitals reviewed. Constitutional: She is oriented to person, place, and time. She appears well-developed and well-nourished.  HENT:  Nose: Mucosal edema  and rhinorrhea present.  Mouth/Throat: Oropharynx is clear and moist.  Neck: Normal range of motion. Neck supple.  Cardiovascular: Normal rate, regular rhythm, normal heart sounds and intact distal pulses.   Pulmonary/Chest: No respiratory distress. She has wheezes. She has no rales. She exhibits no tenderness.  Musculoskeletal: She exhibits edema.  Lymphadenopathy:    She has no cervical adenopathy.  Neurological: She is alert and oriented to person, place, and time.  Skin: Skin is warm and dry.    ED Course  Procedures (including critical care time) Labs Review Labs Reviewed - No data to display  Imaging Review Dg Chest 2 View  07/07/2013   CLINICAL DATA:  Shortness of breath and coughing.  Wheezing.  EXAM: CHEST  2 VIEW  COMPARISON:  Two-view chest 11/03/2012.  FINDINGS: The heart is mildly enlarged. Emphysematous changes are present. Mild interstitial edema is present. There are no significant effusions. No focal airspace disease is present. The visualized soft tissues and bony thorax are unremarkable.  IMPRESSION: 1. Borderline cardiomegaly and mild edema compatible with early congestive heart failure. 2. No focal airspace disease.   Electronically Signed   By: Lawrence Santiago M.D.   On: 07/07/2013 17:49   X-rays reviewed and report per radiologist.   MDM   1. SOB (shortness of breath)    Sx improved after neb, lungs improved but still wheezes present, cxr-poss early chf, sent for further eval .    Billy Fischer, MD 07/07/13 [phone]

## 2013-07-07 NOTE — Discharge Instructions (Signed)

## 2013-07-07 NOTE — ED Notes (Signed)
Pt from [facility]: Pt went there to be tx for wheezing dt asthma. Chest xray showed "early CHF"; sent here to be evaluated. Pt reports mild SOB after breathing tx; wheezing noted in bilateral bases. Denies CP, but pitting edema noted to bilateral legs. Denies N/V.

## 2013-07-07 NOTE — ED Notes (Signed)
MD at bedside. 

## 2013-07-07 NOTE — ED Provider Notes (Signed)
CSN: [id number]     Arrival date & time 07/07/13  1858 History   First MD Initiated Contact with Patient 07/07/13 1929     Chief Complaint  Patient presents with  . Shortness of Breath      In addition to what is below. Patient is a 73 year old female with past medical history of asthma, hypertension, dyslipidemia, and known left bundle-branch block who presents with shortness of breath and cough. Patient states that she's been intermittently short of breath for the last week. Her cough is productive of yellow sputum. She also reports intermittent bouts of wheezing. She has been taking her home inhalers without any relief. She was seen in urgent care prior to arrival in the emergency department. There are chest x-ray showed borderline cardiomegaly and pulmonary edema concerning for CHF. She was sent here for further evaluation. Patient denies chest pain, fever, abdominal pain,. She does report chronic lower ext edema as well as urinary frequency.     (Consider location/radiation/quality/duration/timing/severity/associated sxs/prior Treatment) Patient is a 73 y.o. female presenting with shortness of breath. The history is provided by the patient and medical records. No language interpreter was used.  Shortness of Breath Severity:  Moderate Onset quality:  Gradual Timing:  Constant Progression:  Worsening Chronicity:  New Relieved by:  Nothing Worsened by:  Exertion, movement and coughing Ineffective treatments:  Inhaler Associated symptoms: cough, sputum production and wheezing   Associated symptoms: no abdominal pain, no chest pain, no diaphoresis, no fever, no headaches, no hemoptysis and no vomiting     Past Medical History  Diagnosis Date  . Asthma   . Hypertension   . OSA on CPAP   . Dyslipidemia   . Hypothyroidism   . LBBB (left bundle branch block)    Past Surgical History  Procedure Laterality Date  . Cholecystectomy  1976  . Tonsillectomy  1965  . Abdominal  hysterectomy  1969  . Plantar fascia release    . US echocardiography  09/20/2008    borderline LVH,mild TR,AOV mildly sclerotic w/ca+ of the leaflets  . Nm myoview ltd  02/13/2010    No ischemia   Family History  Problem Relation Age of Onset  . Diabetes Father   . Heart failure Father    History  Substance Use Topics  . Smoking status: Former Smoker    Quit date: 02/04/2006  . Smokeless tobacco: Not on file  . Alcohol Use: No   OB History   Grav Para Term Preterm Abortions TAB SAB Ect Mult Living                 Review of Systems  Constitutional: Negative for fever and diaphoresis.  Respiratory: Positive for cough, sputum production, shortness of breath and wheezing. Negative for hemoptysis.   Cardiovascular: Negative for chest pain.  Gastrointestinal: Negative for vomiting and abdominal pain.  Neurological: Negative for headaches.  All other systems reviewed and are negative.     Allergies  Penicillins  Home Medications   Prior to Admission medications   Medication Sig Start Date End Date Taking? Authorizing Provider  albuterol (PROVENTIL HFA;VENTOLIN HFA) 108 (90 BASE) MCG/ACT inhaler Inhale 2 puffs into the lungs every 4 (four) hours as needed. For shortness of breath and wheezing    Historical Provider, MD  beclomethasone (QVAR) 80 MCG/ACT inhaler Inhale 2 puffs into the lungs 2 (two) times daily.    Historical Provider, MD  cycloSPORINE (RESTASIS) 0.05 % ophthalmic emulsion Place 1 drop into both eyes 2 (two)  times daily.     Historical Provider, MD  diclofenac sodium (VOLTAREN) 1 % GEL Apply 2 g topically daily as needed (Knee pain).     Historical Provider, MD  esomeprazole (NEXIUM) 40 MG capsule Take 40 mg by mouth daily before breakfast.      Historical Provider, MD  fluticasone (FLONASE) 50 MCG/ACT nasal spray Place 1 spray into both nostrils daily as needed for allergies or rhinitis.    Historical Provider, MD  furosemide (LASIX) 20 MG tablet Take 20 mg by  mouth 2 (two) times daily.    Historical Provider, MD  levocetirizine (XYZAL) 5 MG tablet Take 5 mg by mouth every evening.    Historical Provider, MD  montelukast (SINGULAIR) 10 MG tablet Take 10 mg by mouth at bedtime.    Historical Provider, MD  Olopatadine HCl (PATADAY) 0.2 % SOLN Apply 1 drop to eye daily as needed (Allergies).     Historical Provider, MD  rosuvastatin (CRESTOR) 40 MG tablet Take 1 tablet (40 mg total) by mouth daily. <please schedule appointment with Dr. Debara Pickett 06/10/13   Pixie Casino, MD  topiramate (TOPAMAX) 50 MG tablet Take 50 mg by mouth 2 (two) times daily as needed (migraines).     Historical Provider, MD  valsartan-hydrochlorothiazide (DIOVAN-HCT) 320-25 MG per tablet Take 1 tablet by mouth every morning. <please make appointment with Dr. Debara Pickett 06/10/13   Pixie Casino, MD   BP 153/76  Pulse 83  Temp(Src) 98.1 F (36.7 C) (Oral)  Resp 11  Ht 5\' 6"  (1.676 m)  SpO2 95% Physical Exam  Nursing note and vitals reviewed. Constitutional: She is oriented to person, place, and time. She appears well-developed and well-nourished.  HENT:  Head: Normocephalic and atraumatic.  Left Ear: External ear normal.  Nose: Nose normal.  Mouth/Throat: Oropharynx is clear and moist.  Eyes: Conjunctivae are normal. Pupils are equal, round, and reactive to light.  Neck: Normal range of motion. Neck supple.  Cardiovascular: Normal rate, regular rhythm, normal heart sounds and intact distal pulses.   Pulmonary/Chest: Effort normal. She has no wheezes. She has rales (bibasilar).  Abdominal: Soft. Bowel sounds are normal. She exhibits no distension. There is no tenderness.  Musculoskeletal: Normal range of motion. She exhibits edema (1= pitting edema to BLEs). She exhibits no tenderness.  Neurological: She is alert and oriented to person, place, and time. No cranial nerve deficit. Coordination normal.  Skin: Skin is warm and dry.  Psychiatric: She has a normal mood and affect.     ED Course  Procedures (including critical care time) Labs Review Labs Reviewed  BASIC METABOLIC PANEL - Abnormal; Notable for the following:    Potassium 3.3 (*)    Glucose, Bld 141 (*)    GFR calc non Af Amer 65 (*)    GFR calc Af Amer 76 (*)    All other components within normal limits  PRO B NATRIURETIC PEPTIDE - Abnormal; Notable for the following:    Pro B Natriuretic peptide (BNP) 170.7 (*)    All other components within normal limits  CBC  URINALYSIS, ROUTINE W REFLEX MICROSCOPIC  I-STAT TROPOININ, ED    Imaging Review Dg Chest 2 View  07/07/2013   CLINICAL DATA:  Shortness of breath and coughing.  Wheezing.  EXAM: CHEST  2 VIEW  COMPARISON:  Two-view chest 11/03/2012.  FINDINGS: The heart is mildly enlarged. Emphysematous changes are present. Mild interstitial edema is present. There are no significant effusions. No focal airspace disease is present.  The visualized soft tissues and bony thorax are unremarkable.  IMPRESSION: 1. Borderline cardiomegaly and mild edema compatible with early congestive heart failure. 2. No focal airspace disease.   Electronically Signed   By: Lawrence Santiago M.D.   On: 07/07/2013 17:49     EKG Interpretation   Date/Time:  Wednesday July 07 2013 19:07:46 EDT Ventricular Rate:  72 PR Interval:  174 QRS Duration: 138 QT Interval:  434 QTC Calculation: 475 R Axis:   -16 Text Interpretation:  Normal sinus rhythm Non-specific intra-ventricular  conduction block Cannot rule out Anteroseptal infarct , age undetermined T  wave abnormality, consider lateral ischemia Abnormal ECG Overall similar  previous Confirmed by ZAVITZ  MD, JOSHUA ([id number]) on 07/07/2013 10:08:38 PM       Date: 07/07/2013  Rate: 72  Rhythm: normal sinus rhythm  QRS Axis: normal  Intervals: normal  ST/T Wave abnormalities: nonspecific T wave changes  Conduction Disutrbances:left bundle branch block  Narrative Interpretation:   Old EKG Reviewed: unchanged       MDM    Final diagnoses:  SOB (shortness of breath)  Asthma    Patient presents to the emergency department with shortness of breath, cough, and wheezing. She was seen in urgent care and there a chest x-ray showed mild cardiomegaly and mild pulmonary edema. She was instructed to come to the ED for further evaluation. On arrival she was found to be afebrile, not tachypnea, and satting well on room air. Physical exam showed mild bilateral lower ext edema as well as mild bibasilar crackles. She was not in any respiratory distress. EKG shows left bundle branch block and unchanged from prior. Chest x-ray was not repeated as  less than 24 hours in our system. Review of labs shows the patient has a mildly elevated BNP of 170. Given patient is not on any oxygen, no respiratory distress, and not having any chest pain concerning for ACS it was felt that discharge with close PCP followup was appropriate. We'll also treat with steroids and albuterol treatments as patient clinically appears to also have asthma exacerbation. She was in agreement with plan and return precautions given.    Corlis Leak, MD 07/07/13 [phone]

## 2013-07-07 NOTE — ED Notes (Signed)
Report called to Magda Paganini, ED First Nurse.

## 2013-07-07 NOTE — ED Notes (Signed)
Pt  Has  Symptoms  Of  cough   /  Congested  Wheezing        Worse  Today  -  Pt  Has  Asthma   Symptoms  Not  releived  By  Her  Inhalers        -  Pt  Has  Sinus  Drainage  As  Well    With  A  Cough

## 2013-07-07 NOTE — ED Notes (Signed)
Pt ambulated to restroom with out difficulty and stead gait

## 2013-07-08 NOTE — ED Provider Notes (Signed)
Medical screening examination/treatment/procedure(s) were conducted as a shared visit with non-physician practitioner(s) or resident  and myself.  I personally evaluated the patient during the encounter and agree with the findings and plan unless otherwise indicated.    I have personally reviewed any xrays and/ or EKG's with the provider and I agree with interpretation.   Patient presented with shortness of breath wheezing patient has asthma history and his overall feels the same. Mild leg swelling chronic. Patient is a productive cough as well. On exam patient has expiratory wheezes bilateral, no distress, abdomen soft nontender, regular rate and rhythm, mild leg edema bilateral.  Nebs and steroids given in ER. Patient improved in the ER and delta troponin negative. Chest x-ray reviewed with mild pulmonary edema.  Clinically patient acute asthma exacerbation however patient does have a few components of mild pulmonary edema. I feel at this time patient can continue treatment outpatient instead of inpatient. EKG reviewed similar previous left bundle branch block. Patient not requiring oxygen in well-appearing in ED. Discussed close followup outpatient with cardiology and reasons to return. Labs Reviewed  BASIC METABOLIC PANEL - Abnormal; Notable for the following:    Potassium 3.3 (*)    Glucose, Bld 141 (*)    GFR calc non Af Amer 65 (*)    GFR calc Af Amer 76 (*)    All other components within normal limits  PRO B NATRIURETIC PEPTIDE - Abnormal; Notable for the following:    Pro B Natriuretic peptide (BNP) 170.7 (*)    All other components within normal limits  CBC  URINALYSIS, ROUTINE W REFLEX MICROSCOPIC  I-STAT TROPOININ, ED    Dg Chest 2 View  07/07/2013   CLINICAL DATA:  Shortness of breath and coughing.  Wheezing.  EXAM: CHEST  2 VIEW  COMPARISON:  Two-view chest 11/03/2012.  FINDINGS: The heart is mildly enlarged. Emphysematous changes are present. Mild interstitial edema is present.  There are no significant effusions. No focal airspace disease is present. The visualized soft tissues and bony thorax are unremarkable.  IMPRESSION: 1. Borderline cardiomegaly and mild edema compatible with early congestive heart failure. 2. No focal airspace disease.   Electronically Signed   By: Lawrence Santiago M.D.   On: 07/07/2013 17:49   Results and differential diagnosis were discussed with the patient/parent/guardian. Close follow up outpatient was discussed, comfortable with the plan.   Filed Vitals:   07/07/13 2125 07/07/13 2155 07/07/13 2307 07/07/13 2317  BP: 168/71 162/76 159/69 153/76  Pulse: 76 78 81 83  Temp:    98.1 F (36.7 C)  TempSrc:    Oral  Resp: 21 20  11   Height:      SpO2: 98% 97% 97% 95%       Mariea Clonts, MD 07/08/13 [phone]

## 2013-07-19 ENCOUNTER — Encounter: Payer: Self-pay | Admitting: Internal Medicine

## 2013-07-20 ENCOUNTER — Ambulatory Visit (INDEPENDENT_AMBULATORY_CARE_PROVIDER_SITE_OTHER): Payer: Medicare HMO | Admitting: Cardiology

## 2013-07-20 ENCOUNTER — Other Ambulatory Visit: Payer: Self-pay | Admitting: Internal Medicine

## 2013-07-20 ENCOUNTER — Encounter: Payer: Self-pay | Admitting: Cardiology

## 2013-07-20 VITALS — BP 132/80 | HR 88 | Ht 66.0 in | Wt 199.7 lb

## 2013-07-20 DIAGNOSIS — Z9989 Dependence on other enabling machines and devices: Secondary | ICD-10-CM

## 2013-07-20 DIAGNOSIS — G4733 Obstructive sleep apnea (adult) (pediatric): Secondary | ICD-10-CM

## 2013-07-20 DIAGNOSIS — I517 Cardiomegaly: Secondary | ICD-10-CM

## 2013-07-20 DIAGNOSIS — R609 Edema, unspecified: Secondary | ICD-10-CM | POA: Insufficient documentation

## 2013-07-20 DIAGNOSIS — R0609 Other forms of dyspnea: Secondary | ICD-10-CM

## 2013-07-20 DIAGNOSIS — I10 Essential (primary) hypertension: Secondary | ICD-10-CM

## 2013-07-20 DIAGNOSIS — R600 Localized edema: Secondary | ICD-10-CM | POA: Insufficient documentation

## 2013-07-20 DIAGNOSIS — D472 Monoclonal gammopathy: Secondary | ICD-10-CM

## 2013-07-20 DIAGNOSIS — I447 Left bundle-branch block, unspecified: Secondary | ICD-10-CM

## 2013-07-20 DIAGNOSIS — R0989 Other specified symptoms and signs involving the circulatory and respiratory systems: Secondary | ICD-10-CM

## 2013-07-20 DIAGNOSIS — R0600 Dyspnea, unspecified: Secondary | ICD-10-CM

## 2013-07-20 MED ORDER — VALSARTAN-HYDROCHLOROTHIAZIDE 320-25 MG PO TABS: 1.0000 | ORAL_TABLET | Freq: Every morning | ORAL | Status: DC

## 2013-07-20 MED ORDER — ROSUVASTATIN CALCIUM 40 MG PO TABS: 40.0000 mg | ORAL_TABLET | Freq: Every day | ORAL | Status: DC

## 2013-07-20 MED ORDER — ROSUVASTATIN CALCIUM 40 MG PO TABS
40.0000 mg | ORAL_TABLET | Freq: Every day | ORAL | Status: DC
Start: 2013-07-20 — End: 2013-11-29

## 2013-07-20 NOTE — Assessment & Plan Note (Signed)
Take 60mg  furosemide Wednesday and Thursday  - Take 40mg  furosemide Friday, Saturday, Sunday - On Monday June 22, go back to taking 20mg  once daily.   She will also obtain support stockings- her previous stockings have been stretched out of shape, prescription given.

## 2013-07-20 NOTE — Assessment & Plan Note (Signed)
Followed by oncology 

## 2013-07-20 NOTE — Addendum Note (Signed)
Addended by: Diana Eves on: 07/20/2013 10:49 AM   Modules accepted: Orders

## 2013-07-20 NOTE — Assessment & Plan Note (Signed)
Chronic. 

## 2013-07-20 NOTE — Telephone Encounter (Signed)
Rx was sent to pharmacy electronically. 

## 2013-07-20 NOTE — Assessment & Plan Note (Signed)
Stable

## 2013-07-20 NOTE — Patient Instructions (Addendum)
Compression Stockings 20-31mmHg Right Calf - 15 Right Ankle - 9 Left Calf - 15.5 Left Ankle - 9.5  Your physician has requested that you have an echocardiogram. Echocardiography is a painless test that uses sound waves to create images of your heart. It provides your doctor with information about the size and shape of your heart and how well your heart's chambers and valves are working. This procedure takes approximately one hour. There are no restrictions for this procedure. ** please schedule this prior to your office visit with Dr. Debara Pickett on July 9th.   MED CHANGES - Take 60mg  furosemide Wednesday and Thursday  - Take 40mg  furosemide Friday, Saturday, Sunday - On Monday June 22, go back to taking 20mg  once daily.

## 2013-07-20 NOTE — Assessment & Plan Note (Signed)
Chest x-ray recently done in the emergency room reveals cardiomegaly mild and some mild heart failure. Her diuretic has been adjusted please see above.  We will repeat her echo to further evaluate her LV function with recent acute exacerbation of shortness of breath in combination with her asthma attack.

## 2013-07-20 NOTE — Progress Notes (Signed)
07/20/2013   PCP: No PCP Per Patient   Chief Complaint  Patient presents with  . Follow-up    ED 07/07/13 for dyspnea caused by Asthma, was told she has CHF which is causeing her ankles swelling and has never herd this before. has chest pain on occasion.     Primary Cardiologist: Dr. Alma Friendly   HPI:  73 year old female, history of some lower extremity edema, asthma, seasonal allergies, and a left bundle branch block. There was no evidence for ischemia based on stress testing in 2012.  + OSA with Cpap, chronic LBBB.  Her last echocardiogram was 2010 EF greater than 55% she did have an impaired LV relaxation.  Patient presents today after being seen in the emergency room for shortness of breath.  She was told she had congestive heart failure and needed to be evaluated. She does have some lower extremity edema especially at the ankles her pro BNP was 170 during that hospital visit.  Her chest x-ray did have mild cardiomegaly which is new and mild edema probably early congestive heart failure.  Her troponin was negative.    Today she denies shortness of breath or chest pain except with her asthma and bronchitis she will get occasional shortness of breath.      Allergies  Allergen Reactions  . Other     Bee stings   . Penicillins Hives    Current Outpatient Prescriptions  Medication Sig Dispense Refill  . acetaminophen (TYLENOL) 500 MG tablet Take 500 mg by mouth every 6 (six) hours as needed for mild pain.      Marland Kitchen albuterol (PROVENTIL HFA;VENTOLIN HFA) 108 (90 BASE) MCG/ACT inhaler Inhale 2 puffs into the lungs every 4 (four) hours as needed for shortness of breath. For shortness of breath and wheezing      . beclomethasone (QVAR) 80 MCG/ACT inhaler Inhale 2 puffs into the lungs 2 (two) times daily.      . benzonatate (TESSALON) 100 MG capsule Take 1 capsule (100 mg total) by mouth every 8 (eight) hours.  21 capsule  0  . cetirizine (ZYRTEC) 10 MG tablet Take 10 mg by  mouth daily as needed for allergies.      . cycloSPORINE (RESTASIS) 0.05 % ophthalmic emulsion Place 1 drop into both eyes 2 (two) times daily.       . diclofenac sodium (VOLTAREN) 1 % GEL Apply 2 g topically daily as needed (Knee pain).       Marland Kitchen esomeprazole (NEXIUM) 40 MG capsule Take 40 mg by mouth 2 (two) times daily.       . furosemide (LASIX) 20 MG tablet Take 20 mg by mouth daily.      . montelukast (SINGULAIR) 10 MG tablet Take 10 mg by mouth at bedtime.      . Olopatadine HCl (PATADAY) 0.2 % SOLN Apply 1 drop to eye daily as needed (Allergies).       . predniSONE (DELTASONE) 20 MG tablet 2 tabs po daily x 4 days  8 tablet  0  . rosuvastatin (CRESTOR) 40 MG tablet Take 1 tablet (40 mg total) by mouth daily.  90 tablet  3  . topiramate (TOPAMAX) 50 MG tablet Take 50 mg by mouth 2 (two) times daily as needed (migraines).       . valsartan-hydrochlorothiazide (DIOVAN-HCT) 320-25 MG per tablet Take 1 tablet by mouth every morning.  90 tablet  3   No current facility-administered medications for  this visit.    Past Medical History  Diagnosis Date  . Asthma   . Hypertension   . OSA on CPAP   . Dyslipidemia   . Hypothyroidism   . LBBB (left bundle branch block)     Past Surgical History  Procedure Laterality Date  . Cholecystectomy  1976  . Tonsillectomy  1965  . Abdominal hysterectomy  1969  . Plantar fascia release    . US echocardiography  09/20/2008    borderline LVH,mild TR,AOV mildly sclerotic w/ca+ of the leaflets  . Nm myoview ltd  02/13/2010    No ischemia    HEN:IDPOEUM:PN colds or fevers, no weight changes Skin:no rashes or ulcers HEENT:no blurred vision, no congestion CV:see HPI PUL:see HPI GI:no diarrhea constipation or melena, no indigestion GU:no hematuria, no dysuria MS:no joint pain, no claudication, + ankle edema Neuro:no syncope, no lightheadedness Endo:no diabetes, no thyroid disease  Wt Readings from Last 3 Encounters:  07/20/13 199 lb 11.2 oz  (90.583 kg)  06/24/13 198 lb (89.812 kg)  06/03/13 200 lb 12.8 oz (91.082 kg)    PHYSICAL EXAM BP 132/80  Pulse 88  Ht 5\' 6"  (1.676 m)  Wt 199 lb 11.2 oz (90.583 kg)  BMI 32.25 kg/m2 General:Pleasant affect, NAD Skin:Warm and dry, brisk capillary refill HEENT:normocephalic, sclera clear, mucus membranes moist Neck:supple, no JVD, no bruits  Heart:S1S2 RRR without murmur, gallup, rub or click Lungs:clear without rales, rhonchi, or wheezes TIR:WERX, non tender, + BS, do not palpate liver spleen or masses Ext:no lower ext edema, 2+ pedal pulses, 2+ radial pulses Neuro:alert and oriented, MAE, follows commands, + facial symmetry  No EKG- I did review EKG from the hospital and no acute changes. Chronic LBBB  ASSESSMENT AND PLAN Bilateral leg edema Take 60mg  furosemide Wednesday and Thursday  - Take 40mg  furosemide Friday, Saturday, Sunday - On Monday June 22, go back to taking 20mg  once daily.   She will also obtain support stockings- her previous stockings have been stretched out of shape, prescription given.   Cardiomegaly Chest x-ray recently done in the emergency room reveals cardiomegaly mild and some mild heart failure. Her diuretic has been adjusted please see above.  We will repeat her echo to further evaluate her LV function with recent acute exacerbation of shortness of breath in combination with her asthma attack.  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 Debara Pickett as previously scheduled in July for results of the test. If her chest pain continues or if her EF is abnormal we may need to proceed with stress test.

## 2013-07-20 NOTE — Assessment & Plan Note (Signed)
Currently stable.

## 2013-07-29 ENCOUNTER — Ambulatory Visit (INDEPENDENT_AMBULATORY_CARE_PROVIDER_SITE_OTHER): Payer: Medicare HMO | Admitting: Internal Medicine

## 2013-07-29 ENCOUNTER — Ambulatory Visit (HOSPITAL_COMMUNITY)
Admission: RE | Admit: 2013-07-29 | Discharge: 2013-07-29 | Disposition: A | Discharge status: Home / Self Care | Payer: Medicare HMO | Source: Ambulatory Visit | Attending: Cardiology | Admitting: Cardiology

## 2013-07-29 ENCOUNTER — Encounter: Payer: Self-pay | Admitting: Internal Medicine

## 2013-07-29 VITALS — BP 145/84 | HR 77 | Ht 66.0 in | Wt 197.9 lb

## 2013-07-29 DIAGNOSIS — I10 Essential (primary) hypertension: Secondary | ICD-10-CM

## 2013-07-29 DIAGNOSIS — R0609 Other forms of dyspnea: Secondary | ICD-10-CM

## 2013-07-29 DIAGNOSIS — R0600 Dyspnea, unspecified: Secondary | ICD-10-CM

## 2013-07-29 DIAGNOSIS — I517 Cardiomegaly: Secondary | ICD-10-CM | POA: Diagnosis not present

## 2013-07-29 DIAGNOSIS — Z9989 Dependence on other enabling machines and devices: Secondary | ICD-10-CM

## 2013-07-29 DIAGNOSIS — I447 Left bundle-branch block, unspecified: Secondary | ICD-10-CM

## 2013-07-29 DIAGNOSIS — I5031 Acute diastolic (congestive) heart failure: Secondary | ICD-10-CM | POA: Insufficient documentation

## 2013-07-29 DIAGNOSIS — R0989 Other specified symptoms and signs involving the circulatory and respiratory systems: Secondary | ICD-10-CM

## 2013-07-29 DIAGNOSIS — I369 Nonrheumatic tricuspid valve disorder, unspecified: Secondary | ICD-10-CM

## 2013-07-29 DIAGNOSIS — G4733 Obstructive sleep apnea (adult) (pediatric): Secondary | ICD-10-CM

## 2013-07-29 DIAGNOSIS — R609 Edema, unspecified: Secondary | ICD-10-CM

## 2013-07-29 MED ORDER — METOPROLOL SUCCINATE ER 25 MG PO TB24: 12.5000 mg | ORAL_TABLET | Freq: Every day | ORAL | Status: DC

## 2013-07-29 NOTE — Progress Notes (Signed)
2D Echo Performed 07/29/2013    Marygrace Drought, RCS

## 2013-07-29 NOTE — Progress Notes (Signed)
OFFICE NOTE  Chief Complaint:  Routine office visit  Primary Care Physician: No PCP Per Patient  HPI:  Andrea Lowery is a 73 year old female, history of some lower extremity edema, asthma, seasonal allergies, and a left bundle branch block. There was no evidence for ischemia based on stress testing in 2012. The main issue today is she was told recently that she was having worsening problems with snoring and witnessed episodes of apnea at night. She says her sleep is poor. She often feels fatigued throughout the day. Occasionally wakes up with headaches in the morning. Apparently, she underwent a sleep study in 2008 or so and that was negative; however, her weight was about 150 to 160 at the time. Since she has stopped smoking and gained about 40 pounds. I was concerned about sleep apnea and sent her for a sleep study which demonstrated significant sleep apnea. She was fitted with a mask and is doing well.  I have also readjusted her medications, switching her to Diovan HCTZ and decreasing her amlodipine, which has since been discontinued totally. She also recently underwent extensive dental work by Dr. Buelah Manis.  This included moderate sedation and was without complication.  She returns today feeling fairly well. EKG continues to show persistent left bundle branch block.  Andrea Lowery returns today for followup appointment. She was just seen by Cecilie Kicks, nurse practitioner, in our office for hospital followup. She presented to the hospital with shortness of breath and was told she was in congestive heart failure. A chest x-ray showed borderline cardiomegaly with early interstitial edema. A BNP was only mildly elevated at 170. She was told she had significant heart failure and there was concern about her persistent left bundle branch block which is well-documented and is nonischemic. She was discharged and in followup was given additional Lasix and lost 2 pounds. She reports her  breathing is back to normal although she still has persistent leg swelling. She is not good at reducing salt in her diet in fact eats a good amount of it. In addition, she is not on beta blocker.  A repeat echocardiogram was performed today, and demonstrated preserved systolic function with an EF of 55-60%, no significant valvular disease, and diastolic dysfunction.  PMHx:  Past Medical History  Diagnosis Date  . Asthma   . Hypertension   . OSA on CPAP   . Dyslipidemia   . Hypothyroidism   . LBBB (left bundle branch block)     Past Surgical History  Procedure Laterality Date  . Cholecystectomy  1976  . Tonsillectomy  1965  . Abdominal hysterectomy  1969  . Plantar fascia release    . US echocardiography  09/20/2008    borderline LVH,mild TR,AOV mildly sclerotic w/ca+ of the leaflets  . Nm myoview ltd  02/13/2010    No ischemia    FAMHx:  Family History  Problem Relation Age of Onset  . Diabetes Father   . Heart failure Father     SOCHx:   reports that she quit smoking about 7 years ago. She does not have any smokeless tobacco history on file. She reports that she does not drink alcohol or use illicit drugs.  ALLERGIES:  Allergies  Allergen Reactions  . Bee Venom Anaphylaxis    Yellow jackets  . Penicillins Hives   ROS: A comprehensive review of systems was negative except for: Constitutional: positive for fatigue Cardiovascular: positive for dyspnea  HOME MEDS: Current Outpatient Prescriptions  Medication Sig Dispense Refill  .  acetaminophen (TYLENOL) 500 MG tablet Take 500 mg by mouth every 6 (six) hours as needed for mild pain.      Marland Kitchen albuterol (PROVENTIL HFA;VENTOLIN HFA) 108 (90 BASE) MCG/ACT inhaler Inhale 2 puffs into the lungs every 4 (four) hours as needed for shortness of breath. For shortness of breath and wheezing      . beclomethasone (QVAR) 80 MCG/ACT inhaler Inhale 2 puffs into the lungs 2 (two) times daily.      . benzonatate (TESSALON) 100 MG capsule  Take 1 capsule (100 mg total) by mouth every 8 (eight) hours.  21 capsule  0  . cetirizine (ZYRTEC) 10 MG tablet Take 10 mg by mouth daily as needed for allergies.      . cycloSPORINE (RESTASIS) 0.05 % ophthalmic emulsion Place 1 drop into both eyes 2 (two) times daily.       . diclofenac sodium (VOLTAREN) 1 % GEL Apply 2 g topically daily as needed (Knee pain).       Marland Kitchen EPINEPHrine 0.3 mg/0.3 mL IJ SOAJ injection Inject 0.3 mg into the muscle as needed (bee stings).      Marland Kitchen esomeprazole (NEXIUM) 40 MG capsule Take 40 mg by mouth 2 (two) times daily.       . furosemide (LASIX) 20 MG tablet Take 20 mg by mouth daily.      . montelukast (SINGULAIR) 10 MG tablet Take 10 mg by mouth at bedtime.      . Olopatadine HCl (PATADAY) 0.2 % SOLN Apply 1 drop to eye daily as needed (Allergies).       . predniSONE (DELTASONE) 20 MG tablet 2 tabs po daily x 4 days  8 tablet  0  . rosuvastatin (CRESTOR) 40 MG tablet Take 1 tablet (40 mg total) by mouth daily.  90 tablet  3  . topiramate (TOPAMAX) 50 MG tablet Take 50 mg by mouth 2 (two) times daily as needed (migraines).       . valsartan-hydrochlorothiazide (DIOVAN-HCT) 320-25 MG per tablet Take 1 tablet by mouth every morning.  90 tablet  3  . metoprolol succinate (TOPROL XL) 25 MG 24 hr tablet Take 0.5 tablets (12.5 mg total) by mouth daily.  15 tablet  6   No current facility-administered medications for this visit.    LABS/IMAGING: No results found for this or any previous visit (from the past 48 hour(s)). No results found.  VITALS: BP 145/84  Pulse 77  Ht 5\' 6"  (1.676 m)  Wt 197 lb 14.4 oz (89.767 kg)  BMI 31.96 kg/m2  EXAM: General appearance: alert and no distress Neck: no adenopathy, no carotid bruit, no JVD, supple, symmetrical, trachea midline and thyroid not enlarged, symmetric, no tenderness/mass/nodules Lungs: clear to auscultation bilaterally Heart: regular rate and rhythm, S1, S2 normal and systolic murmur: early systolic 2/6,  crescendo at 2nd left intercostal space Abdomen: soft, non-tender; bowel sounds normal; no masses,  no organomegaly Extremities: extremities normal, atraumatic, no cyanosis or edema Pulses: 2+ and symmetric Skin: Skin color, texture, turgor normal. No rashes or lesions Neurologic: Grossly normal  EKG: deferred  ASSESSMENT: 1. Left bundle branch block 2. Hypertension-controlled 3. Mild dyspnea on exertion 4. Obstructive sleep apnea on CPAP 5. Diastolic dysfunction with recent mild acute congestive heart failure, EF 55-60%  PLAN: 1.   Andrea Lowery presented with shortness of breath and was treated in the emergency room for heart failure although her chest x-ray showed borderline cardiomegaly and early interstitial edema. BNP was only mildly elevated. She  did diuresis couple pounds with diuretics and is back on her home dose. I counseled her on salt avoidance and would recommend starting low-dose beta blocker today - Toprol-XL 12.5 mg daily. This should also help with mildly elevated blood pressure. I tried to reassure her as she felt that she may actually be dying based on what they told her at the hospital. I think this is far from the case. She does not have cardiomegaly- I will remove this from her diagnosis list.  Plan followup in 6 months.  Pixie Casino, MD, [facility] Attending Cardiologist [facility] 07/29/2013, 12:54 PM

## 2013-07-29 NOTE — Patient Instructions (Signed)
Your physician has recommended you make the following change in your medication: START metoprolol succinate (Toprol XL) 12.5mg  once daily.   Your physician recommends that you schedule a follow-up appointment in: 6 months.   Please watch your dietary salt intake.   Low-Sodium Eating Plan Sodium raises blood pressure and causes water to be held in the body. Getting less sodium from food will help lower your blood pressure, reduce any swelling, and protect your heart, liver, and kidneys. We get sodium by adding salt (sodium chloride) to food. Most of our sodium comes from canned, boxed, and frozen foods. Restaurant foods, fast foods, and pizza are also very high in sodium. Even if you take medicine to lower your blood pressure or to reduce fluid in your body, getting less sodium from your food is important. WHAT IS MY PLAN? Most people should limit their sodium intake to 2,300 mg a day.  WHAT DO I NEED TO KNOW ABOUT THIS EATING PLAN? For the low-sodium eating plan, you will follow these general guidelines:  Choose foods with a % Daily Value for sodium of less than 5% (as listed on the food label).   Use salt-free seasonings or herbs instead of table salt or sea salt.   Check with your health care provider or pharmacist before using salt substitutes.   Eat fresh foods.  Eat more vegetables and fruits.  Limit canned vegetables. If you do use them, rinse them well to decrease the sodium.   Limit cheese to 1 oz (28 g) per day.   Eat lower-sodium products, often labeled as "lower sodium" or "no salt added."  Avoid foods that contain monosodium glutamate (MSG). MSG is sometimes added to [place] food and some canned foods.  Check food labels (Nutrition Facts labels) on foods to learn how much sodium is in one serving.  Eat more home-cooked food and less restaurant, buffet, and fast food.  When eating at a restaurant, ask that your food be prepared with less salt or none, if  possible.  HOW DO I READ FOOD LABELS FOR SODIUM INFORMATION? The Nutrition Facts label lists the amount of sodium in one serving of the food. If you eat more than one serving, you must multiply the listed amount of sodium by the number of servings. Food labels may also identify foods as:  Sodium free--Less than 5 mg in a serving.  Very low sodium--35 mg or less in a serving.  Low sodium--140 mg or less in a serving.  Light in sodium--50% less sodium in a serving. For example, if a food that usually has 300 mg of sodium is changed to become light in sodium, it will have 150 mg of sodium.  Reduced sodium--25% less sodium in a serving. For example, if a food that usually has 400 mg of sodium is changed to reduced sodium, it will have 300 mg of sodium. WHAT FOODS CAN I EAT? Grains Low-sodium cereals, including oats, puffed wheat and rice, and shredded wheat cereals. Low-sodium crackers. Unsalted rice and pasta. Lower-sodium bread.  Vegetables Frozen or fresh vegetables. Low-sodium or reduced-sodium canned vegetables. Low-sodium or reduced-sodium tomato sauce and paste. Low-sodium or reduced-sodium tomato and vegetable juices.  Fruits Fresh, frozen, and canned fruit. Fruit juice.  Meat and Other Protein Products Low-sodium canned tuna and salmon. Fresh or frozen meat, poultry, seafood, and fish. Lamb. Unsalted nuts. Dried beans, peas, and lentils without added salt. Unsalted canned beans. Homemade soups without salt. Eggs.  Dairy Milk. Soy milk. Ricotta cheese. Low-sodium or  reduced-sodium cheeses. Yogurt.  Condiments Fresh and dried herbs and spices. Salt-free seasonings. Onion and garlic powders. Low-sodium varieties of mustard and ketchup. Lemon juice.  Fats and Oils Reduced-sodium salad dressings. Unsalted butter.  Other Unsalted popcorn and pretzels.  The items listed above may not be a complete list of recommended foods or beverages. Contact your dietitian for more  options. WHAT FOODS ARE NOT RECOMMENDED? Grains Instant hot cereals. Bread stuffing, pancake, and biscuit mixes. Croutons. Seasoned rice or pasta mixes. Noodle soup cups. Boxed or frozen macaroni and cheese. Self-rising flour. Regular salted crackers. Vegetables Regular canned vegetables. Regular canned tomato sauce and paste. Regular tomato and vegetable juices. Frozen vegetables in sauces. Salted french fries. Olives. Angie Fava. Relishes. Sauerkraut. Salsa. Meat and Other Protein Products Salted, canned, smoked, spiced, or pickled meats, seafood, or fish. Bacon, ham, sausage, hot dogs, corned beef, chipped beef, and packaged luncheon meats. Salt pork. Jerky. Pickled herring. Anchovies, regular canned tuna, and sardines. Salted nuts. Dairy Processed cheese and cheese spreads. Cheese curds. Blue cheese and cottage cheese. Buttermilk.  Condiments Onion and garlic salt, seasoned salt, table salt, and sea salt. Canned and packaged gravies. Worcestershire sauce. Tartar sauce. Barbecue sauce. Teriyaki sauce. Soy sauce, including reduced sodium. Steak sauce. Fish sauce. Oyster sauce. Cocktail sauce. Horseradish. Regular ketchup and mustard. Meat flavorings and tenderizers. Bouillon cubes. Hot sauce. Tabasco sauce. Marinades. Taco seasonings. Relishes. Fats and Oils Regular salad dressings. Salted butter. Margarine. Ghee. Bacon fat.  Other Potato and tortilla chips. Corn chips and puffs. Salted popcorn and pretzels. Canned or dried soups. Pizza. Frozen entrees and pot pies.  The items listed above may not be a complete list of foods and beverages to avoid. Contact your dietitian for more information. Document Released: 07/13/2001 Document Revised: 01/26/2013 Document Reviewed: 11/25/2012 [facility] Patient Information 2015 [place], [place]. This information is not intended to replace advice given to you by your health care provider. Make sure you discuss any questions you have with your health care  provider.

## 2013-08-03 ENCOUNTER — Telehealth: Payer: Self-pay | Admitting: *Deleted

## 2013-08-03 NOTE — Telephone Encounter (Signed)
Faxed CPAP supply order to [facility]. 

## 2013-08-12 ENCOUNTER — Ambulatory Visit: Payer: Medicare HMO | Admitting: Internal Medicine

## 2013-08-20 ENCOUNTER — Telehealth: Payer: Self-pay

## 2013-08-20 NOTE — Telephone Encounter (Signed)
Answering pt call from 1240 pm. Dr Juliann Mule is scheduled to leave the practice. The next doctor has not been announced yet. She may call back in a few weeks and ask again.

## 2013-09-05 ENCOUNTER — Encounter (HOSPITAL_COMMUNITY): Payer: Self-pay | Admitting: Emergency Medicine

## 2013-09-05 ENCOUNTER — Emergency Department (HOSPITAL_COMMUNITY): Payer: Medicare HMO

## 2013-09-05 ENCOUNTER — Emergency Department (HOSPITAL_COMMUNITY)
Admission: EM | Admit: 2013-09-05 | Discharge: 2013-09-05 | Disposition: A | Discharge status: Home / Self Care | Payer: Medicare HMO | Attending: Emergency Medicine | Admitting: Emergency Medicine

## 2013-09-05 DIAGNOSIS — G4733 Obstructive sleep apnea (adult) (pediatric): Secondary | ICD-10-CM | POA: Diagnosis not present

## 2013-09-05 DIAGNOSIS — Z79899 Other long term (current) drug therapy: Secondary | ICD-10-CM | POA: Insufficient documentation

## 2013-09-05 DIAGNOSIS — Z87891 Personal history of nicotine dependence: Secondary | ICD-10-CM | POA: Diagnosis not present

## 2013-09-05 DIAGNOSIS — Z860100 Personal history of colon polyps, unspecified: Secondary | ICD-10-CM | POA: Insufficient documentation

## 2013-09-05 DIAGNOSIS — E785 Hyperlipidemia, unspecified: Secondary | ICD-10-CM | POA: Insufficient documentation

## 2013-09-05 DIAGNOSIS — Z9981 Dependence on supplemental oxygen: Secondary | ICD-10-CM | POA: Insufficient documentation

## 2013-09-05 DIAGNOSIS — R300 Dysuria: Secondary | ICD-10-CM | POA: Diagnosis not present

## 2013-09-05 DIAGNOSIS — M545 Low back pain, unspecified: Secondary | ICD-10-CM

## 2013-09-05 DIAGNOSIS — IMO0001 Reserved for inherently not codable concepts without codable children: Secondary | ICD-10-CM | POA: Diagnosis not present

## 2013-09-05 DIAGNOSIS — J45909 Unspecified asthma, uncomplicated: Secondary | ICD-10-CM | POA: Insufficient documentation

## 2013-09-05 DIAGNOSIS — Z880 Allergy status to penicillin: Secondary | ICD-10-CM | POA: Diagnosis not present

## 2013-09-05 HISTORY — DX: Monoclonal gammopathy: D47.2

## 2013-09-05 LAB — URINALYSIS, ROUTINE W REFLEX MICROSCOPIC
Bilirubin Urine: NEGATIVE
Glucose, UA: NEGATIVE mg/dL
Hgb urine dipstick: NEGATIVE
Ketones, ur: NEGATIVE mg/dL
Leukocytes, UA: NEGATIVE
Nitrite: NEGATIVE
Protein, ur: NEGATIVE mg/dL
Specific Gravity, Urine: 1.011 (ref 1.005–1.030)
Urobilinogen, UA: 0.2 mg/dL (ref 0.0–1.0)
pH: 5.5 (ref 5.0–8.0)

## 2013-09-05 MED ORDER — IBUPROFEN 800 MG PO TABS
800.0000 mg | ORAL_TABLET | Freq: Once | ORAL | Status: AC
  Administered 2013-09-05: 800 mg via ORAL
  Filled 2013-09-05: qty 1

## 2013-09-05 NOTE — ED Notes (Signed)
MD at bedside. 

## 2013-09-05 NOTE — ED Provider Notes (Signed)
CSN: [id number]     Arrival date & time 09/05/13  1426 History   First MD Initiated Contact with Patient 09/05/13 1520     Chief Complaint  Patient presents with  . Back Pain     (Consider location/radiation/quality/duration/timing/severity/associated sxs/prior Treatment) Patient is a 73 y.o. female presenting with back pain. The history is provided by the patient.  Back Pain Location:  Lumbar spine Quality:  Aching Radiates to:  Does not radiate Pain severity:  Moderate Pain is:  Same all the time Onset quality:  Sudden Duration:  2 days Timing:  Constant Progression:  Worsening Chronicity:  New Context: not lifting heavy objects and not pedestrian accident   Relieved by:  Nothing Worsened by:  Movement and palpation Ineffective treatments:  None tried Associated symptoms: dysuria   Associated symptoms: no chest pain, no fever and no headaches   Risk factors: no hx of cancer   Risk factors comment:  Hx of mgus  73 y.o. female with a chief complaint of bilateral lobe low back pain.  Patient states this started yesterday. Patient denies any inciting factors. Patient denies any trauma. Patient denies any new exercises. No fevers chills no weakness. Patient with recent diagnosis of MGUS.  No noted lower extremity weakness. Patient denies any bowel or bladder incontinence. Patient with recent bone survey done in April with no noted metastases and no osteoporosis.  Some dysuria.      Past Medical History  Diagnosis Date  . Asthma   . Hypertension   . OSA on CPAP   . Dyslipidemia   . Hypothyroidism   . LBBB (left bundle branch block)   . MGUS (monoclonal gammopathy of unknown significance)    Past Surgical History  Procedure Laterality Date  . Cholecystectomy  1976  . Tonsillectomy  1965  . Abdominal hysterectomy  1969  . Plantar fascia release    . US echocardiography  09/20/2008    borderline LVH,mild TR,AOV mildly sclerotic w/ca+ of the leaflets  . Nm myoview ltd   02/13/2010    No ischemia   Family History  Problem Relation Age of Onset  . Diabetes Father   . Heart failure Father    History  Substance Use Topics  . Smoking status: Former Smoker    Quit date: 02/04/2006  . Smokeless tobacco: Not on file  . Alcohol Use: No   OB History   Grav Para Term Preterm Abortions TAB SAB Ect Mult Living                 Review of Systems  Constitutional: Negative for fever and chills.  HENT: Negative for congestion and rhinorrhea.   Eyes: Negative for redness and visual disturbance.  Respiratory: Negative for shortness of breath and wheezing.   Cardiovascular: Negative for chest pain and palpitations.  Gastrointestinal: Negative for nausea and vomiting.  Genitourinary: Positive for dysuria. Negative for urgency.  Musculoskeletal: Positive for arthralgias, back pain and myalgias.  Skin: Negative for pallor and wound.  Neurological: Negative for dizziness and headaches.      Allergies  Bee venom and Penicillins  Home Medications   Prior to Admission medications   Medication Sig Start Date End Date Taking? Authorizing Provider  acetaminophen (TYLENOL) 500 MG tablet Take 500 mg by mouth every 6 (six) hours as needed for mild pain.   Yes Historical Provider, MD  albuterol (PROVENTIL HFA;VENTOLIN HFA) 108 (90 BASE) MCG/ACT inhaler Inhale 2 puffs into the lungs every 4 (four) hours as needed for  shortness of breath. For shortness of breath and wheezing   Yes Historical Provider, MD  beclomethasone (QVAR) 80 MCG/ACT inhaler Inhale 2 puffs into the lungs 2 (two) times daily.   Yes Historical Provider, MD  cetirizine (ZYRTEC) 10 MG tablet Take 10 mg by mouth daily as needed for allergies.   Yes Historical Provider, MD  cycloSPORINE (RESTASIS) 0.05 % ophthalmic emulsion Place 1 drop into both eyes 2 (two) times daily.    Yes Historical Provider, MD  diclofenac sodium (VOLTAREN) 1 % GEL Apply 2 g topically daily as needed (Knee pain).    Yes Historical  Provider, MD  EPINEPHrine 0.3 mg/0.3 mL IJ SOAJ injection Inject 0.3 mg into the muscle daily as needed (bee stings).    Yes Historical Provider, MD  esomeprazole (NEXIUM) 40 MG capsule Take 40 mg by mouth 2 (two) times daily.    Yes Historical Provider, MD  furosemide (LASIX) 20 MG tablet Take 20 mg by mouth daily.   Yes Historical Provider, MD  metoprolol succinate (TOPROL XL) 25 MG 24 hr tablet Take 0.5 tablets (12.5 mg total) by mouth daily. 07/29/13  Yes Pixie Casino, MD  montelukast (SINGULAIR) 10 MG tablet Take 10 mg by mouth at bedtime.   Yes Historical Provider, MD  Olopatadine HCl (PATADAY) 0.2 % SOLN Apply 1 drop to eye daily as needed (Allergies).    Yes Historical Provider, MD  rosuvastatin (CRESTOR) 40 MG tablet Take 1 tablet (40 mg total) by mouth daily. 07/20/13  Yes Pixie Casino, MD  topiramate (TOPAMAX) 50 MG tablet Take 50 mg by mouth 2 (two) times daily as needed (migraines).    Yes Historical Provider, MD  valsartan-hydrochlorothiazide (DIOVAN-HCT) 320-25 MG per tablet Take 1 tablet by mouth every morning. 07/20/13  Yes Pixie Casino, MD   BP 143/82  Pulse 73  Temp(Src) 98.7 F (37.1 C) (Oral)  Resp 16  SpO2 96% Physical Exam  Constitutional: She is oriented to person, place, and time. She appears well-developed and well-nourished. No distress.  HENT:  Head: Normocephalic and atraumatic.  Eyes: EOM are normal. Pupils are equal, round, and reactive to light.  Neck: Normal range of motion. Neck supple.  Cardiovascular: Normal rate and regular rhythm.  Exam reveals no gallop and no friction rub.   No murmur heard. Pulmonary/Chest: Effort normal. She has no wheezes. She has no rales.  Abdominal: Soft. She exhibits no distension. There is no tenderness.  Musculoskeletal: She exhibits no edema and no tenderness.       Back:  Neurological: She is alert and oriented to person, place, and time. She displays no Babinski's sign on the right side. She displays no  Babinski's sign on the left side.  Reflex Scores:      Patellar reflexes are 2+ on the right side and 2+ on the left side.      Achilles reflexes are 2+ on the right side and 2+ on the left side. 5/5 and equal muscle strength BLE   Skin: Skin is warm and dry. She is not diaphoretic.  Psychiatric: She has a normal mood and affect. Her behavior is normal.    ED Course  Procedures (including critical care time) Labs Review Labs Reviewed  URINALYSIS, ROUTINE W REFLEX MICROSCOPIC - Abnormal; Notable for the following:    APPearance CLOUDY (*)    All other components within normal limits    Imaging Review Dg Lumbar Spine Complete  09/05/2013   CLINICAL DATA:  No injury.  Low  back pain.  EXAM: LUMBAR SPINE - COMPLETE 4+ VIEW  COMPARISON:  06/25/2011.  FINDINGS: There is a mild dextroconvex curve of the thoracolumbar spine with the apex at L1. Vertebral body height is preserved. Cholecystectomy clips are present in the right upper quadrant. No pars defects are identified. Mild RIGHT L5-S1 facet arthrosis. Mild LEFT L4-L5 facet arthrosis. Vertebral body height is preserved. Intervertebral disc spaces appear normal. Aortic atherosclerosis is present. Lumbosacral junction appears within normal limits.  IMPRESSION: No acute abnormality or interval change. Mild dextroconvex thoracolumbar curve may be positional or secondary to spasm.   Electronically Signed   By: Dereck Ligas M.D.   On: 09/05/2013 16:07     EKG Interpretation None      MDM   Final diagnoses:  Bilateral low back pain without sciatica    Patient is a 73 y.o. female who presents with back pain.  This started yesterday.  No noted red flags, midline TTP about L3-4, will obtain plain film to rule out fx. No noted neuro symptoms, do not feel that CT is warranted. Dysuria, will obtain ua.   No noted fx as read by me.     X-ray negative urine negative as well. Visually discharged home she'll take anti-inflammatory medicines for  the next week. She'll follow up with her family doctor.  4:31 PM:  I have discussed the diagnosis/risks/treatment options with the patient and believe the pt to be eligible for discharge home to follow-up with PCP. We also discussed returning to the ED immediately if new or worsening sx occur. We discussed the sx which are most concerning (e.g., loss of bowel, bladder, perianal numbness, leg weakenss) that necessitate immediate return. Medications administered to the patient during their visit and any new prescriptions provided to the patient are listed below.  Medications given during this visit Medications  ibuprofen (ADVIL,MOTRIN) tablet 800 mg (not administered)    New Prescriptions   No medications on file     Deno Etienne, MD 09/05/13 1631

## 2013-09-05 NOTE — ED Provider Notes (Signed)
I saw and evaluated the patient, reviewed the resident's note and I agree with the findings and plan.   EKG Interpretation None      73 yo female with hx of MGUS presenting with nontraumatic low back pain.  No red flag symptoms concerning for worrisome spinal pathology.  On exam, well appearing, nontoxic, not distressed, normal respiratory effort, normal perfusion, palpable muscle spasm in right lumbar paraspinal muscles, also TTP in midline mid lumbar spine.  Plain films negative.  Pt well appearing.  I think she is appropriate for outpatient follow up.  I don't think she needs advanced imaging today, but return precautions were given.  Clinical Impression: 1. Bilateral low back pain without sciatica       Houston Siren III, MD 09/05/13 2101

## 2013-09-05 NOTE — Discharge Instructions (Signed)
Take antiinflamatory medicines, 8 pills over the counter motrin OR 2 pills over the counter naproxen for one week.  Follow up with your Family doctor in one week.   Back Pain, Adult Back pain is very common. The pain often gets better over time. The cause of back pain is usually not dangerous. Most people can learn to manage their back pain on their own.  HOME CARE   Stay active. Start with short walks on flat ground if you can. Try to walk farther each day.  Do not sit, drive, or stand in one place for more than 30 minutes. Do not stay in bed.  Do not avoid exercise or work. Activity can help your back heal faster.  Be careful when you bend or lift an object. Bend at your knees, keep the object close to you, and do not twist.  Sleep on a firm mattress. Lie on your side, and bend your knees. If you lie on your back, put a pillow under your knees.  Only take medicines as told by your doctor.  Put ice on the injured area.  Put ice in a plastic bag.  Place a towel between your skin and the bag.  Leave the ice on for 15-20 minutes, 03-04 times a day for the first 2 to 3 days. After that, you can switch between ice and heat packs.  Ask your doctor about back exercises or massage.  Avoid feeling anxious or stressed. Find good ways to deal with stress, such as exercise. GET HELP RIGHT AWAY IF:   Your pain does not go away with rest or medicine.  Your pain does not go away in 1 week.  You have new problems.  You do not feel well.  The pain spreads into your legs.  You cannot control when you poop (bowel movement) or pee (urinate).  Your arms or legs feel weak or lose feeling (numbness).  You feel sick to your stomach (nauseous) or throw up (vomit).  You have belly (abdominal) pain.  You feel like you may pass out (faint). MAKE SURE YOU:   Understand these instructions.  Will watch your condition.  Will get help right away if you are not doing well or get  worse. Document Released: 07/10/2007 Document Revised: 04/15/2011 Document Reviewed: 05/25/2013 [facility] Patient Information 2015 [place], [place]. This information is not intended to replace advice given to you by your health care provider. Make sure you discuss any questions you have with your health care provider.

## 2013-09-05 NOTE — ED Provider Notes (Signed)
I saw and evaluated the patient, reviewed the resident's note and I agree with the findings and plan.   EKG Interpretation None        Houston Siren III, MD 09/05/13 2101

## 2013-09-05 NOTE — ED Notes (Addendum)
Pt ambulated to restroom with steady gait.

## 2013-09-05 NOTE — ED Notes (Signed)
She c/o non-traumatic low back pain since yesterday.  She states she is treated in our cancer center for m.g.u.s.

## 2013-09-28 ENCOUNTER — Telehealth: Payer: Self-pay | Admitting: Internal Medicine

## 2013-09-28 ENCOUNTER — Encounter: Payer: Self-pay | Admitting: Internal Medicine

## 2013-09-28 ENCOUNTER — Other Ambulatory Visit (HOSPITAL_BASED_OUTPATIENT_CLINIC_OR_DEPARTMENT_OTHER): Payer: Medicare HMO

## 2013-09-28 ENCOUNTER — Ambulatory Visit (HOSPITAL_BASED_OUTPATIENT_CLINIC_OR_DEPARTMENT_OTHER): Payer: Medicare HMO | Admitting: Internal Medicine

## 2013-09-28 VITALS — BP 160/83 | HR 70 | Temp 98.3°F | Resp 18 | Ht 66.0 in | Wt 197.7 lb

## 2013-09-28 DIAGNOSIS — D472 Monoclonal gammopathy: Secondary | ICD-10-CM

## 2013-09-28 DIAGNOSIS — E876 Hypokalemia: Secondary | ICD-10-CM

## 2013-09-28 LAB — CBC WITH DIFFERENTIAL/PLATELET
BASO%: 1 % (ref 0.0–2.0)
Basophils Absolute: 0.1 10*3/uL (ref 0.0–0.1)
EOS%: 4.2 % (ref 0.0–7.0)
Eosinophils Absolute: 0.2 10*3/uL (ref 0.0–0.5)
HCT: 38 % (ref 34.8–46.6)
HGB: 12.4 g/dL (ref 11.6–15.9)
LYMPH%: 29.7 % (ref 14.0–49.7)
MCH: 27.3 pg (ref 25.1–34.0)
MCHC: 32.6 g/dL (ref 31.5–36.0)
MCV: 83.7 fL (ref 79.5–101.0)
MONO#: 0.6 10*3/uL (ref 0.1–0.9)
MONO%: 10 % (ref 0.0–14.0)
NEUT#: 3.1 10*3/uL (ref 1.5–6.5)
NEUT%: 55.1 % (ref 38.4–76.8)
Platelets: 218 10*3/uL (ref 145–400)
RBC: 4.54 10*6/uL (ref 3.70–5.45)
RDW: 15.4 % — ABNORMAL HIGH (ref 11.2–14.5)
WBC: 5.6 10*3/uL (ref 3.9–10.3)
lymph#: 1.7 10*3/uL (ref 0.9–3.3)

## 2013-09-28 LAB — COMPREHENSIVE METABOLIC PANEL (CC13)
ALT: 18 U/L (ref 0–55)
AST: 17 U/L (ref 5–34)
Albumin: 3.7 g/dL (ref 3.5–5.0)
Alkaline Phosphatase: 77 U/L (ref 40–150)
Anion Gap: 7 mEq/L (ref 3–11)
BUN: 6.6 mg/dL — ABNORMAL LOW (ref 7.0–26.0)
CO2: 28 mEq/L (ref 22–29)
Calcium: 9.5 mg/dL (ref 8.4–10.4)
Chloride: 103 mEq/L (ref 98–109)
Creatinine: 0.9 mg/dL (ref 0.6–1.1)
Glucose: 154 mg/dl — ABNORMAL HIGH (ref 70–140)
Potassium: 3.3 mEq/L — ABNORMAL LOW (ref 3.5–5.1)
Sodium: 139 mEq/L (ref 136–145)
Total Bilirubin: 0.66 mg/dL (ref 0.20–1.20)
Total Protein: 7.9 g/dL (ref 6.4–8.3)

## 2013-09-28 MED ORDER — POTASSIUM CHLORIDE ER 10 MEQ PO TBCR: 10.0000 meq | EXTENDED_RELEASE_TABLET | Freq: Every day | ORAL | Status: DC

## 2013-09-28 NOTE — Telephone Encounter (Signed)
gv adn printed appt sched and avs for pt for Dec °

## 2013-09-28 NOTE — Progress Notes (Signed)
[facility] OFFICE PROGRESS NOTE  No PCP Per Patient No address on file  DIAGNOSIS: MGUS (monoclonal gammopathy of unknown significance) - Plan: CBC with Differential, Basic metabolic panel (Bmet) - [facility], Lactate dehydrogenase (LDH) - [facility], IgG, IgA, IgM, Kappa/lambda light chains  Hypokalemia  Chief Complaint  Patient presents with  . Smothering Multiple myeloma    CURRENT TREATMENT: Observation.   INTERVAL HISTORY: Andrea Lowery 73 y.o. female with a history of MGUS is here for follow up. She was last seen by me on 06/24/2013.  She was seen by Dr. Estanislado Pandy on 02/15/2013 for pain and discomfort and few weeks ago. She reviewed her labs. She had DEXA scan and was told that she did not have osteoporosis. She is off Prolia now. Dr. Estanislado Pandy also discussed the labs noted below. She has a long-standing history of multiple arthralgias, left CMC pain, OA of the bilateral knees, hands and feet and osteoporosis for which she is on Prolia previously. She had SPEP drawn on 02/19/2013 that revealed a restricted band with monoclonal protein present. The monoclonal protein peak accounted for 1.38 g/dL of the total 1.66 g/dL of protein in the gamma region. Her CMP demonstrated a creatinine of 0.81 and calcium of 9.0 and total protein of 7.2 and albumin of 3.9; Her CBC was within normal limits with a hemoglobin of 12.5. Magnesium was 2.1. IgG of 2,190 mg/dL, IgA of 81 mg/dL, IgM of 22 mg/dL; She had a normal Parathyroid hormone of 63.6 pg/mL Vitamin D level was mildly low at 26 ng/mL.   Today, she reports that feels fine overall.  She denies any bone pain, fevers or chills.  She also denies any acute shortness of breath. She had a bone marrow biopsy on 06/22/2013.  She presented to ED on 07/07/2013 due to worsening dyspnea and chest x-ray demonstrated cardiomegaly (borderline) and some interstitial edema.  She was discharged on lasix and counseled on DASH diet. She was evaluated by Dr.  Debara Pickett of Cardiology on 07/29/2013 with recommendations to continue her diuretics and started her on Toprol-XL 12.5 mg daily.  She reports that her weight is stable but does report muscle cramps in her lower extremities.   MEDICAL HISTORY: Past Medical History  Diagnosis Date  . Asthma   . Hypertension   . OSA on CPAP   . Dyslipidemia   . Hypothyroidism   . LBBB (left bundle branch block)   . MGUS (monoclonal gammopathy of unknown significance)     INTERIM HISTORY: has Essential hypertension; LBBB (left bundle branch block); DOE (dyspnea on exertion); OSA on CPAP; MGUS (monoclonal gammopathy of unknown significance); Bilateral leg edema; and Acute diastolic CHF (congestive heart failure), NYHA class 2 on her problem list.    ALLERGIES:  is allergic to bee venom and penicillins.  MEDICATIONS: has a current medication list which includes the following prescription(s): acetaminophen, albuterol, beclomethasone, cetirizine, cyclosporine, diclofenac sodium, epinephrine, esomeprazole, furosemide, metoprolol succinate, montelukast, olopatadine hcl, rosuvastatin, valsartan-hydrochlorothiazide, and potassium chloride.  SURGICAL HISTORY:  Past Surgical History  Procedure Laterality Date  . Cholecystectomy  1976  . Tonsillectomy  1965  . Abdominal hysterectomy  1969  . Plantar fascia release    . US echocardiography  09/20/2008    borderline LVH,mild TR,AOV mildly sclerotic w/ca+ of the leaflets  . Nm myoview ltd  02/13/2010    No ischemia    REVIEW OF SYSTEMS:   Constitutional: Denies fevers, chills or abnormal weight loss Eyes: Denies blurriness of vision Ears, nose, mouth,  throat, and face: Denies mucositis or sore throat Respiratory: Denies cough, dyspnea or wheezes Cardiovascular: Denies palpitation, chest discomfort or lower extremity swelling Gastrointestinal:  Denies nausea, heartburn or change in bowel habits Skin: Denies abnormal skin rashes Lymphatics: Denies new lymphadenopathy  or easy bruising Neurological:Denies numbness, tingling or new weaknesses Behavioral/Psych: Mood is stable, no new changes  All other systems were reviewed with the patient and are negative.  PHYSICAL EXAMINATION: ECOG PERFORMANCE STATUS: 0 - Asymptomatic  Blood pressure 160/83, pulse 70, temperature 98.3 F (36.8 C), temperature source Oral, resp. rate 18, height $RemoveBe'5\' 6"'OeiorwyEr$  (1.676 m), weight 197 lb 11.2 oz (89.676 kg), SpO2 100.00%.  GENERAL:alert, no distress and comfortable; well developed and well nourished elderly female who appears younger than her stated age.  SKIN: skin color, texture, turgor are normal, no rashes or significant lesions EYES: normal, Conjunctiva are pink and non-injected, sclera clear OROPHARYNX:no exudate, no erythema and lips, buccal mucosa, and tongue normal  NECK: supple, thyroid normal size, non-tender, without nodularity LYMPH:  no palpable lymphadenopathy in the cervical, axillary or supraclavicular LUNGS: clear to auscultation with normal breathing effort, no wheezes or rhonchi HEART: regular rate & rhythm and no murmurs and no lower extremity edema ABDOMEN:abdomen soft, non-tender and normal bowel sounds Musculoskeletal:no cyanosis of digits and no clubbing  NEURO: alert & oriented x 3 with fluent speech, no focal motor/sensory deficits  Labs:  Lab Results  Component Value Date   WBC 5.6 09/28/2013   HGB 12.4 09/28/2013   HCT 38.0 09/28/2013   MCV 83.7 09/28/2013   PLT 218 09/28/2013   NEUTROABS 3.1 09/28/2013      Chemistry      Component Value Date/Time   NA 139 09/28/2013 1005   NA 141 07/07/2013 1912   K 3.3* 09/28/2013 1005   K 3.3* 07/07/2013 1912   CL 103 07/07/2013 1912   CO2 28 09/28/2013 1005   CO2 24 07/07/2013 1912   BUN 6.6* 09/28/2013 1005   BUN 10 07/07/2013 1912   CREATININE 0.9 09/28/2013 1005   CREATININE 0.87 07/07/2013 1912      Component Value Date/Time   CALCIUM 9.5 09/28/2013 1005   CALCIUM 9.6 07/07/2013 1912   ALKPHOS 77 09/28/2013 1005    ALKPHOS 101 03/31/2010 0730   AST 17 09/28/2013 1005   AST 27 03/31/2010 0730   ALT 18 09/28/2013 1005   ALT 38* 03/31/2010 0730   BILITOT 0.66 09/28/2013 1005   BILITOT 0.8 03/31/2010 0730      Studies:  No results found.   RADIOGRAPHIC STUDIES: Dg Bone Survey Met  05/21/2013   CLINICAL DATA:  Monoclonal gammopathy of unknown significance.  EXAM: METASTATIC BONE SURVEY  COMPARISON:  None.  FINDINGS: There are no osteolytic or osteoblastic lesions. Bones are diffusely demineralized. Degenerative changes are noted of the mid and lower cervical spine and throughout the thoracic spine. Milder degenerative changes noted in the lower lumbar spine.  Heart, mediastinum and hila are unremarkable. The lungs are clear. There are surgical staples in the right upper quadrant suggesting a prior cholecystectomy.  IMPRESSION: 1. No osteoblastic or osteolytic lesions. 2. No evidence of multiple myeloma.   Electronically Signed   By: Lajean Manes M.D.   On: 05/21/2013 13:09   PATHOLOGY: 06/22/2013  Diagnosis Bone Marrow, Aspirate,Biopsy, and Clot, right iliac - NORMOCELLULAR MARROW WITH MONOCLONAL PLASMACYTOSIS (10%). - SEE COMMENT. PERIPHERAL BLOOD: - UNREMARKABLE. Diagnosis Note Overall the bone marrow is normocellular, but displays a monoclonal plasmacytosis. By immunohistochemistry and manual  differential counts there are approximately 10% plasma cells, which are kappa restricted. These findings are consistent with a plasma cell neoplasm. Correlation with clinical, radiographic, and laboratory data is required for further classification. Vicente Males MD Pathologist, Electronic Signature (Case signed 06/24/2013)  ASSESSMENT: Andrea Lowery 73 y.o. female with a history of MGUS (monoclonal gammopathy of unknown significance) - Plan: CBC with Differential, Basic metabolic panel (Bmet) - [facility], Lactate dehydrogenase (LDH) - [facility], IgG, IgA, IgM, Kappa/lambda light chains  Hypokalemia   PLAN:  1.   Smothering IgG kappa MM.   --Based on review of her SPEP with immunofixation, UPEP, 24- hour protein, we obtained a bone marrow biopsy demonstrating 10% plasma cells. Today's CBC and CMP is within normal limits (with exception of mildly low potassium of 3.3). Her skeletal survey is negative. Her IgG is 2.090 with an M-spike of 1.41 g/dL, with a kappa:lambda ratio of 3.06 (which is abnormal).  She meets criteria for smothering MM with a risk of progression 10% per year for first 5 years.  We will see her every 3-52month for labs including MM and chemistries and CBC.  She has a normal calcium, creatinine and hemoglobin.  2. Hypokalemia.  --we will start potassium 10 mEq daily.  A hypokalemia handout was provided to the patient. Likely secondary to diurectics.   3. Follow-up.  --Patient will follow up in 3-4 months for repeat labs.   All questions were answered. The patient knows to call the clinic with any problems, questions or concerns. We can certainly see the patient much sooner if necessary.  I spent 15 minutes counseling the patient face to face. The total time spent in the appointment was 25 minutes.    CHISM, DAVID, MD 09/28/2013 2:52 PM

## 2013-09-28 NOTE — Patient Instructions (Signed)

## 2013-09-30 LAB — SPEP & IFE WITH QIG
Albumin ELP: 53.7 % — ABNORMAL LOW (ref 55.8–66.1)
Alpha-1-Globulin: 3.9 % (ref 2.9–4.9)
Alpha-2-Globulin: 9.7 % (ref 7.1–11.8)
Beta 2: 4.3 % (ref 3.2–6.5)
Beta Globulin: 5.1 % (ref 4.7–7.2)
Gamma Globulin: 23.3 % — ABNORMAL HIGH (ref 11.1–18.8)
IgA: 90 mg/dL (ref 69–380)
IgG (Immunoglobin G), Serum: 1930 mg/dL — ABNORMAL HIGH (ref 690–1700)
IgM, Serum: 25 mg/dL — ABNORMAL LOW (ref 52–322)
M-Spike, %: 1.41 g/dL
Total Protein, Serum Electrophoresis: 7.6 g/dL (ref 6.0–8.3)

## 2013-09-30 LAB — KAPPA/LAMBDA LIGHT CHAINS
Kappa free light chain: 4.71 mg/dL — ABNORMAL HIGH (ref 0.33–1.94)
Kappa:Lambda Ratio: 3.12 — ABNORMAL HIGH (ref 0.26–1.65)
Lambda Free Lght Chn: 1.51 mg/dL (ref 0.57–2.63)

## 2013-11-19 ENCOUNTER — Telehealth: Payer: Self-pay | Admitting: Internal Medicine

## 2013-11-19 NOTE — Telephone Encounter (Signed)
Attempted to call patient's home # - no answer/VM full? (was in [place]) Called cell # - son answered - patient is not available at this time - he will have her call back.

## 2013-11-19 NOTE — Telephone Encounter (Signed)
Please call her back

## 2013-11-19 NOTE — Telephone Encounter (Signed)
Patient changed insurance companies - [employer] to [employer] and now has to use [facility] for CPAP supplies instead of [facility]. Her DIL states they sent over a form (medicare transition checklist) in order for patient to have her CPAP machine repaired or replaced (she got her machine in 2013). Her DIL states that they were notified by our medical records department that the MD was not in the practice and then [facility] re-faxed this form to Dr. Evette Georges attention on Sept 1st or 2nd. RN cannot locate form in mailbox or locate in EPIC were it was documented to have been faxed to [place].   I called [facility] [phone] and they will re-fax this form to be signed.   Patient/DIL asked to be contacted if they need to provide any further information and to be notified that fax was signed off on and sent in

## 2013-11-19 NOTE — Telephone Encounter (Signed)
Please call,concerning her C Pap machine. °

## 2013-11-19 NOTE — Telephone Encounter (Signed)
Spoke with patient and informed her she would need to be seen in clinic for evaluation with CPAP machine. Patient then proceeds to inform nurse that she no longer has the machine, that she threw it out when it didn't work (stating "I knew I should have kept it")  Regardless, for Medicare Transition Patient Checklist CPAP Device and/or Supplies form, a face-to-face encounter is needed (and a download)  Patient is scheduled to see. Dr. Claiborne Billings on 11/29/13 for sleep clinic at 11:30am

## 2013-11-19 NOTE — Telephone Encounter (Signed)
Patient states she stopped wearing CPAP about 2 months ago b/c it was making a loud noise and stopped working.

## 2013-11-22 ENCOUNTER — Telehealth: Payer: Self-pay | Admitting: Cardiovascular Disease

## 2013-11-24 NOTE — Telephone Encounter (Signed)
Closed encounter °

## 2013-11-29 ENCOUNTER — Ambulatory Visit (INDEPENDENT_AMBULATORY_CARE_PROVIDER_SITE_OTHER): Payer: Medicare HMO | Admitting: Cardiovascular Disease

## 2013-11-29 VITALS — BP 163/83 | HR 70 | Ht 65.0 in | Wt 198.8 lb

## 2013-11-29 DIAGNOSIS — G4733 Obstructive sleep apnea (adult) (pediatric): Secondary | ICD-10-CM

## 2013-11-29 DIAGNOSIS — Z9989 Dependence on other enabling machines and devices: Secondary | ICD-10-CM

## 2013-11-29 DIAGNOSIS — I10 Essential (primary) hypertension: Secondary | ICD-10-CM

## 2013-11-29 DIAGNOSIS — R600 Localized edema: Secondary | ICD-10-CM

## 2013-11-29 DIAGNOSIS — I447 Left bundle-branch block, unspecified: Secondary | ICD-10-CM

## 2013-11-29 NOTE — Patient Instructions (Addendum)
Your physician has recommended that you have a sleep study. This test records several body functions during sleep, including: brain activity, eye movement, oxygen and carbon dioxide blood levels, heart rate and rhythm, breathing rate and rhythm, the flow of air through your mouth and nose, snoring, body muscle movements, and chest and belly movement. Thus will be scheduled as a split night study and will be done @ [facility].  Your physician recommends that you schedule a follow-up appointment in: 2 months after study -sleep clinic.

## 2013-11-30 ENCOUNTER — Encounter: Payer: Self-pay | Admitting: Cardiovascular Disease

## 2013-11-30 NOTE — Progress Notes (Signed)
Patient ID: Andrea Lowery, female   DOB: 1940-11-22, 73 y.o.   MRN: [id number]     HPI: Andrea Lowery is a 73 y.o. female who presents to the sleep clinic for evaluation of obstructive sleep apnea.  Andrea Lowery has a history of hypertension, hyperlipidemia, hypothyroidism, as well as peripheral edema.  In August 2013.  She was referred for a diagnostic polysomnogram.  At that time.  Her Epworth sleepiness scale score endorsed at 18 was consistent with significant daytime sleepiness.  She was found to have mild sleep apnea overall with a child 5.4, although her RDI was 13.2 per hour.  She had moderately severe sleep apnea with REM sleep with an HI of 25.2 and RDI of 34.8 during REM sleep.  CPAP was initially started, but she was unable to tolerate higher pressures, and ultimately was switched to BiPAP therapy.  In December 2013, on BiPAP therapy, pressure of 14/10.  She was doing well and her Epworth Sleepiness Scale score had significantly improved to 3.  He was seen in the sleep clinic at that time and on an IPAP pressure of 14 and EPAP pressure 10, her AHI was 0.9.  At that time, she felt well.  She denied daytime sleepiness.  Her sleep was restorative.  Apparently, Andrea Lowery had changed several doctors over the past several years.  She apparently stopped using her BiPAP unit and tells me she may have thrown this out since it was not working.  She has not used treatment in over a year.  Recently, she has noticed that she now has similar symptoms that she had had prior to initiating CPAP/BiPAP therapy.  She wakes up numerous times per night.  Her sleep is nonrestorative.  She notes significant hypersomnolence.  She presents for evaluation.  She also admits to mild ankle swelling and recent elevation of her blood pressure.  A new report scale was calculated today and this endorsed at 19 consistent with severe hypersomnolenceas shown below.   Epworth Sleepiness Scale: Situation   Chance  of Dozing/Sleeping (0 = never , 1 = slight chance , 2 = moderate chance , 3 = high chance )   sitting and reading 3   watching TV 3   sitting inactive in a public place 2   being a passenger in a motor vehicle for an hour or more 3   lying down in the afternoon 3   sitting and talking to someone 2   sitting quietly after lunch (no alcohol) 3   while stopped for a few minutes in traffic as the driver 0   Total Score  19    Past Medical History  Diagnosis Date  . Asthma   . Hypertension   . OSA on CPAP   . Dyslipidemia   . Hypothyroidism   . LBBB (left bundle branch block)   . MGUS (monoclonal gammopathy of unknown significance)     Past Surgical History  Procedure Laterality Date  . Cholecystectomy  1976  . Tonsillectomy  1965  . Abdominal hysterectomy  1969  . Plantar fascia release    . US echocardiography  09/20/2008    borderline LVH,mild TR,AOV mildly sclerotic w/ca+ of the leaflets  . Nm myoview ltd  02/13/2010    No ischemia    Allergies  Allergen Reactions  . Bee Venom Anaphylaxis    Yellow jackets  . Penicillins Hives    Current Outpatient Prescriptions  Medication Sig Dispense Refill  . acetaminophen (TYLENOL) 500  MG tablet Take 500 mg by mouth every 6 (six) hours as needed for mild pain.      Marland Kitchen albuterol (PROVENTIL HFA;VENTOLIN HFA) 108 (90 BASE) MCG/ACT inhaler Inhale 2 puffs into the lungs every 4 (four) hours as needed for shortness of breath. For shortness of breath and wheezing      . beclomethasone (QVAR) 80 MCG/ACT inhaler Inhale 2 puffs into the lungs 2 (two) times daily.      . cetirizine (ZYRTEC) 10 MG tablet Take 10 mg by mouth daily as needed for allergies.      . cycloSPORINE (RESTASIS) 0.05 % ophthalmic emulsion Place 1 drop into both eyes 2 (two) times daily.       . diclofenac sodium (VOLTAREN) 1 % GEL Apply 2 g topically daily as needed (Knee pain).       Marland Kitchen EPINEPHrine 0.3 mg/0.3 mL IJ SOAJ injection Inject 0.3 mg into the muscle daily as  needed (bee stings).       Marland Kitchen esomeprazole (NEXIUM) 40 MG capsule Take 40 mg by mouth 2 (two) times daily.       . furosemide (LASIX) 20 MG tablet Take 20 mg by mouth daily.      Marland Kitchen lovastatin (MEVACOR) 20 MG tablet Take 20 mg by mouth daily.      . metoprolol succinate (TOPROL XL) 25 MG 24 hr tablet Take 0.5 tablets (12.5 mg total) by mouth daily.  15 tablet  6  . montelukast (SINGULAIR) 10 MG tablet Take 10 mg by mouth at bedtime.      . Olopatadine HCl (PATADAY) 0.2 % SOLN Apply 1 drop to eye daily as needed (Allergies).       . potassium chloride (K-DUR) 10 MEQ tablet Take 1 tablet (10 mEq total) by mouth daily.  30 tablet  0  . traZODone (DESYREL) 50 MG tablet Take 50 mg by mouth every 6 (six) hours as needed for sleep.      . valsartan-hydrochlorothiazide (DIOVAN-HCT) 160-12.5 MG per tablet Take 1 tablet by mouth daily.       No current facility-administered medications for this visit.    History   Social History  . Marital Status: Divorced    Spouse Name: N/A    Number of Children: N/A  . Years of Education: N/A   Occupational History  . Not on file.   Social History Main Topics  . Smoking status: Former Smoker    Quit date: 02/04/2006  . Smokeless tobacco: Not on file  . Alcohol Use: No  . Drug Use: No  . Sexual Activity: Not on file   Other Topics Concern  . Not on file   Social History Narrative  . No narrative on file     ROS General: Negative; No fevers, chills, or night sweats HEENT: Negative; No changes in vision or hearing, sinus congestion, difficulty swallowing Pulmonary: positive for asthma; No cough,  shortness of breath, hemoptysis Cardiovascular: Negative; No chest pain, presyncope, syncope, palpitations Positive for ankle swelling GI: Negative; No nausea, vomiting, diarrhea, or abdominal pain GU: Negative; No dysuria, hematuria, or difficulty voiding Musculoskeletal: Negative; no myalgias, joint pain, or weakness Hematologic: Negative; no easy  bruising, bleeding Immunologic: Positive for seasonal allergies Endocrine: Negative; no heat/cold intolerance Neuro: Negative; no changes in balance, headaches Skin: Negative; No rashes or skin lesions Psychiatric: Negative; No behavioral problems, depression Sleep: positive for sleep apnea with significant hypersomnolence, mild eg movements; no bruxismhypnogognic hallucinations, no cataplexy   Physical Exam BP 163/83  Pulse 70  Ht 5\' 5"  (1.651 m)  Wt 198 lb 12.8 oz (90.175 kg)  BMI 33.08 kg/m2  General: Alert, oriented, no distress.  Skin: normal turgor, no rashes HEENT: Normocephalic, atraumatic. Pupils round and reactive; sclera anicteric; extraocular muscles intact; Fundi ** Nose without nasal septal hypertrophy Mouth/Parynx benign; Mallinpatti scale** Neck: No JVD, no carotid briuts Lungs: clear to ausculatation and percussion; no wheezing or rales  Chest wall: No tenderness to palpation Heart: RRR, s1 s2 normal  Abdomen: soft, nontender; no hepatosplenomehaly, BS+; abdominal aorta nontender and not dilated by palpation. Back: No CVA tenderness Pulses 2+ Extremities: no clubbinbg cyanosis or edema, Homan's sign negative  Neurologic: grossly nonfocal; cranial nerves intact. Psychological: Normal affect and mood.   LABS:  BMET    Component Value Date/Time   NA 139 09/28/2013 1005   NA 141 07/07/2013 1912   K 3.3* 09/28/2013 1005   K 3.3* 07/07/2013 1912   CL 103 07/07/2013 1912   CO2 28 09/28/2013 1005   CO2 24 07/07/2013 1912   GLUCOSE 154* 09/28/2013 1005   GLUCOSE 141* 07/07/2013 1912   BUN 6.6* 09/28/2013 1005   BUN 10 07/07/2013 1912   CREATININE 0.9 09/28/2013 1005   CREATININE 0.87 07/07/2013 1912   CALCIUM 9.5 09/28/2013 1005   CALCIUM 9.6 07/07/2013 1912   GFRNONAA 65* 07/07/2013 1912   GFRAA 76* 07/07/2013 1912     Hepatic Function Panel     Component Value Date/Time   PROT 7.9 09/28/2013 1005   PROT 9.2* 03/31/2010 0730   ALBUMIN 3.7 09/28/2013 1005   ALBUMIN 4.5  03/31/2010 0730   AST 17 09/28/2013 1005   AST 27 03/31/2010 0730   ALT 18 09/28/2013 1005   ALT 38* 03/31/2010 0730   ALKPHOS 77 09/28/2013 1005   ALKPHOS 101 03/31/2010 0730   BILITOT 0.66 09/28/2013 1005   BILITOT 0.8 03/31/2010 0730     CBC    Component Value Date/Time   WBC 5.6 09/28/2013 1005   WBC 6.9 07/07/2013 1912   RBC 4.54 09/28/2013 1005   RBC 4.35 07/07/2013 1912   HGB 12.4 09/28/2013 1005   HGB 12.6 07/07/2013 1912   HCT 38.0 09/28/2013 1005   HCT 36.1 07/07/2013 1912   PLT 218 09/28/2013 1005   PLT 228 07/07/2013 1912   MCV 83.7 09/28/2013 1005   MCV 83.0 07/07/2013 1912   MCH 27.3 09/28/2013 1005   MCH 29.0 07/07/2013 1912   MCHC 32.6 09/28/2013 1005   MCHC 34.9 07/07/2013 1912   RDW 15.4* 09/28/2013 1005   RDW 15.0 07/07/2013 1912   LYMPHSABS 1.7 09/28/2013 1005   LYMPHSABS 1.8 11/03/2012 1130   MONOABS 0.6 09/28/2013 1005   MONOABS 0.9 11/03/2012 1130   EOSABS 0.2 09/28/2013 1005   EOSABS 0.2 11/03/2012 1130   BASOSABS 0.1 09/28/2013 1005   BASOSABS 0.0 11/03/2012 1130     BNP    Component Value Date/Time   PROBNP 170.7* 07/07/2013 1912    Lipid Panel  No results found for this basename: chol, trig, hdl, cholhdl, vldl, ldlcalc, ldldirect     RADIOLOGY: No results found.    ASSESSMENT AND PLAN: Andrea Lowery is a 73 year old female who has a history of lower extremity edema, asthma, seasonal allergies, and chronic left bundle branch block.  She was previously diagnosed with obstructive sleep apnea in 2013, which was mild overall but moderately severe during REM sleep.  She could not tolerate higher pressure CPAP therapy, but previously 1 switch  to BiPAP at a pressure of 14/10, did remarkably well.  She apparently has not been using CPAP therapy for over a year and states she no longer has her machine.  Since she is in need for new machine, but is not been compliant recently with by not using therapy.  She will need to have a split-night study reevaluation.  I will request  that she be titrated with BiPAP due to prior documented failure with CPAP therapy.  I will schedule this to be done at the [facility].  Once she receives her new machine, a download will be obtained and I will see her in sleep clinic for further evaluation.  Presently, her blood pressure is elevated and I recommended further titration of her Diovan HCT from 160/12.5-320/12.5 mg.     Troy Sine, MD, [facility]  11/30/2013 7:15 PM

## 2013-12-27 DIAGNOSIS — E785 Hyperlipidemia, unspecified: Secondary | ICD-10-CM | POA: Insufficient documentation

## 2013-12-28 DIAGNOSIS — G479 Sleep disorder, unspecified: Secondary | ICD-10-CM | POA: Insufficient documentation

## 2013-12-28 HISTORY — DX: Sleep disorder, unspecified: G47.9

## 2014-01-13 ENCOUNTER — Encounter: Payer: Self-pay | Admitting: Internal Medicine

## 2014-01-13 ENCOUNTER — Ambulatory Visit (INDEPENDENT_AMBULATORY_CARE_PROVIDER_SITE_OTHER): Payer: Medicare HMO | Admitting: Internal Medicine

## 2014-01-13 VITALS — BP 144/90 | HR 64 | Ht 66.0 in | Wt 195.4 lb

## 2014-01-13 DIAGNOSIS — Z79899 Other long term (current) drug therapy: Secondary | ICD-10-CM

## 2014-01-13 DIAGNOSIS — I10 Essential (primary) hypertension: Secondary | ICD-10-CM

## 2014-01-13 DIAGNOSIS — G4733 Obstructive sleep apnea (adult) (pediatric): Secondary | ICD-10-CM

## 2014-01-13 DIAGNOSIS — I447 Left bundle-branch block, unspecified: Secondary | ICD-10-CM | POA: Diagnosis not present

## 2014-01-13 DIAGNOSIS — I5031 Acute diastolic (congestive) heart failure: Secondary | ICD-10-CM

## 2014-01-13 DIAGNOSIS — R600 Localized edema: Secondary | ICD-10-CM

## 2014-01-13 DIAGNOSIS — R0602 Shortness of breath: Secondary | ICD-10-CM | POA: Diagnosis not present

## 2014-01-13 DIAGNOSIS — Z9989 Dependence on other enabling machines and devices: Secondary | ICD-10-CM

## 2014-01-13 MED ORDER — FUROSEMIDE 20 MG PO TABS: 40.0000 mg | ORAL_TABLET | Freq: Every day | ORAL | Status: DC

## 2014-01-13 MED ORDER — ROSUVASTATIN CALCIUM 40 MG PO TABS: 40.0000 mg | ORAL_TABLET | Freq: Every day | ORAL | Status: DC

## 2014-01-13 MED ORDER — POTASSIUM CHLORIDE ER 20 MEQ PO TBCR: EXTENDED_RELEASE_TABLET | ORAL | Status: DC

## 2014-01-13 NOTE — Progress Notes (Signed)
OFFICE NOTE  Chief Complaint:  Routine office visit, ankle swelling  Primary Care Physician: Saralyn Pilar  HPI:  Andrea Lowery is a 73 year old female, history of some lower extremity edema, asthma, seasonal allergies, and a left bundle branch block. There was no evidence for ischemia based on stress testing in 2012. The main issue today is she was told recently that she was having worsening problems with snoring and witnessed episodes of apnea at night. She says her sleep is poor. She often feels fatigued throughout the day. Occasionally wakes up with headaches in the morning. Apparently, she underwent a sleep study in 2008 or so and that was negative; however, her weight was about 150 to 160 at the time. Since she has stopped smoking and gained about 40 pounds. I was concerned about sleep apnea and sent her for a sleep study which demonstrated significant sleep apnea. She was fitted with a mask and is doing well.  I have also readjusted her medications, switching her to Diovan HCTZ and decreasing her amlodipine, which has since been discontinued totally. She also recently underwent extensive dental work by Dr. Buelah Manis.  This included moderate sedation and was without complication.  She returns today feeling fairly well. EKG continues to show persistent left bundle branch block.  Andrea Lowery returns today for followup appointment. She was just seen by Andrea Lowery, nurse practitioner, in our office for hospital followup. She presented to the hospital with shortness of breath and was told she was in congestive heart failure. A chest x-ray showed borderline cardiomegaly with early interstitial edema. A BNP was only mildly elevated at 170. She was told she had significant heart failure and there was concern about her persistent left bundle branch block which is well-documented and is nonischemic. She was discharged and in followup was given additional Lasix and lost 2 pounds.  She reports her breathing is back to normal although she still has persistent leg swelling. She is not good at reducing salt in her diet in fact eats a good amount of it. In addition, she is not on beta blocker.  A repeat echocardiogram was performed today, and demonstrated preserved systolic function with an EF of 55-60%, no significant valvular disease, and diastolic dysfunction.  I saw Andrea Lowery back today in the office. Her main complaint still is about ankle swelling. She has nonpitting edema mostly over the lateral malleolus bilaterally. Seems to be slightly worse in the right than the left. She denies any worsening shortness of breath or chest pain. She tells me that her primary care provider decrease her antihypertensive medications for unknown reasons, specifically valsartan was cut in half. She continues to have mild hypokalemia on low-dose repletion. She also was switched to pravastatin but is interested in going back to Crestor.  PMHx:  Past Medical History  Diagnosis Date  . Asthma   . Hypertension   . OSA on CPAP   . Dyslipidemia   . Hypothyroidism   . LBBB (left bundle branch block)   . MGUS (monoclonal gammopathy of unknown significance)     Past Surgical History  Procedure Laterality Date  . Cholecystectomy  1976  . Tonsillectomy  1965  . Abdominal hysterectomy  1969  . Plantar fascia release    . US echocardiography  09/20/2008    borderline LVH,mild TR,AOV mildly sclerotic w/ca+ of the leaflets  . Nm myoview ltd  02/13/2010    No ischemia    FAMHx:  Family History  Problem Relation Age  of Onset  . Diabetes Father   . Heart failure Father     SOCHx:   reports that she quit smoking about 7 years ago. She does not have any smokeless tobacco history on file. She reports that she does not drink alcohol or use illicit drugs.  ALLERGIES:  Allergies  Allergen Reactions  . Bee Venom Anaphylaxis    Yellow jackets  . Penicillins Hives   ROS: A comprehensive  review of systems was negative except for: Cardiovascular: positive for lower extremity edema  HOME MEDS: Current Outpatient Prescriptions  Medication Sig Dispense Refill  . acetaminophen (TYLENOL) 500 MG tablet Take 500 mg by mouth every 6 (six) hours as needed for mild pain.    Marland Kitchen albuterol (PROVENTIL HFA;VENTOLIN HFA) 108 (90 BASE) MCG/ACT inhaler Inhale 2 puffs into the lungs every 4 (four) hours as needed for shortness of breath. For shortness of breath and wheezing    . beclomethasone (QVAR) 80 MCG/ACT inhaler Inhale 2 puffs into the lungs 2 (two) times daily.    . cetirizine (ZYRTEC) 10 MG tablet Take 10 mg by mouth daily as needed for allergies.    . cycloSPORINE (RESTASIS) 0.05 % ophthalmic emulsion Place 1 drop into both eyes 2 (two) times daily.     . diclofenac sodium (VOLTAREN) 1 % GEL Apply 2 g topically daily as needed (Knee pain).     Marland Kitchen doxycycline (VIBRA-TABS) 100 MG tablet Take 100 mg by mouth as directed.    Marland Kitchen EPINEPHrine 0.3 mg/0.3 mL IJ SOAJ injection Inject 0.3 mg into the muscle daily as needed (bee stings).     Marland Kitchen esomeprazole (NEXIUM) 40 MG capsule Take 40 mg by mouth 2 (two) times daily.     . fluconazole (DIFLUCAN) 150 MG tablet as directed. Take 1 tablet by mouth for one dose. May repeat dose in 3 days.    . fluticasone (FLONASE) 50 MCG/ACT nasal spray as needed.    . furosemide (LASIX) 20 MG tablet Take 2 tablets (40 mg total) by mouth daily. 60 tablet 6  . metoprolol succinate (TOPROL XL) 25 MG 24 hr tablet Take 0.5 tablets (12.5 mg total) by mouth daily. 15 tablet 6  . montelukast (SINGULAIR) 10 MG tablet Take 10 mg by mouth at bedtime.    . Olopatadine HCl (PATADAY) 0.2 % SOLN Apply 1 drop to eye daily as needed (Allergies).     . potassium chloride 20 MEQ TBCR Take 2 tablets (71mEq) by mouth daily. 60 tablet 6  . traMADol (ULTRAM) 50 MG tablet as needed.  0  . traZODone (DESYREL) 50 MG tablet Take 50 mg by mouth every 6 (six) hours as needed for sleep.    .  valsartan-hydrochlorothiazide (DIOVAN-HCT) 160-12.5 MG per tablet Take 1 tablet by mouth daily.    . rosuvastatin (CRESTOR) 40 MG tablet Take 1 tablet (40 mg total) by mouth daily. 30 tablet 6   No current facility-administered medications for this visit.    LABS/IMAGING: No results found for this or any previous visit (from the past 48 hour(s)). No results found.  VITALS: BP 144/90 mmHg  Pulse 64  Ht 5\' 6"  (1.676 m)  Wt 195 lb 6.4 oz (88.633 kg)  BMI 31.55 kg/m2  EXAM: General appearance: alert and no distress Neck: no adenopathy, no carotid bruit, no JVD, supple, symmetrical, trachea midline and thyroid not enlarged, symmetric, no tenderness/mass/nodules Lungs: clear to auscultation bilaterally Heart: regular rate and rhythm, S1, S2 normal and systolic murmur: early systolic 2/6, crescendo  at 2nd left intercostal space Abdomen: soft, non-tender; bowel sounds normal; no masses,  no organomegaly Extremities: extremities normal, atraumatic, no cyanosis or edema Pulses: 2+ and symmetric Skin: Skin color, texture, turgor normal. No rashes or lesions Neurologic: Grossly normal  EKG: Normal sinus rhythm at 64, left bundle-branch block  ASSESSMENT: 1. Left bundle branch block 2. Hypertension-controlled 3. Mild dyspnea on exertion 4. Obstructive sleep apnea on CPAP 5. Diastolic dysfunction with recent mild acute congestive heart failure, EF 55-60%  PLAN: 1.   Andrea Lowery is doing fairly well. She is still complaining of some leg swelling. We could consider increasing her Lasix to 40 mg daily however she will need to be on more potassium. I like her to increase that to 40 mEq daily. We will plan to recheck a BNP and BMP in 1 week. We will keep her current dose of valsartan the same. She is going to switch back to Crestor 40 mg daily. Plan to see her back in 6 months.  Pixie Casino, MD, [facility] Attending Cardiologist [facility]  C 01/13/2014, 2:07 PM

## 2014-01-13 NOTE — Patient Instructions (Addendum)
Your physician has recommended you make the following change in your medication...  1. STOP pravastatin - START crestor 40mg  once daily 2. INCREASE furosemide (lasix) to 40mg  daily  (2 - 20mg  tablets) 3. INCREASE potassium to 40 mEq daily (2 - 29mEq tablets) >> we will keep all BP meds the same  Your physician recommends that you return for lab work NEXT WEEK  Your physician wants you to follow-up in: 6 months with Dr. Debara Pickett. You will receive a reminder letter in the mail two months in advance. If you don't receive a letter, please call our office to schedule the follow-up appointment.

## 2014-01-17 ENCOUNTER — Telehealth: Payer: Self-pay | Admitting: Hematology and Oncology

## 2014-01-17 NOTE — Telephone Encounter (Signed)
moved appt from CP1 to NG. s/w pt re appt for 12/22 lb/NG 12pm.

## 2014-01-18 LAB — BASIC METABOLIC PANEL
BUN: 12 mg/dL (ref 6–23)
CO2: 30 mEq/L (ref 19–32)
Calcium: 9.9 mg/dL (ref 8.4–10.5)
Chloride: 98 mEq/L (ref 96–112)
Creat: 0.93 mg/dL (ref 0.50–1.10)
Glucose, Bld: 106 mg/dL — ABNORMAL HIGH (ref 70–99)
Potassium: 3.8 mEq/L (ref 3.5–5.3)
Sodium: 138 mEq/L (ref 135–145)

## 2014-01-18 LAB — BRAIN NATRIURETIC PEPTIDE: Brain Natriuretic Peptide: 8.3 pg/mL (ref 0.0–100.0)

## 2014-01-20 ENCOUNTER — Telehealth: Payer: Self-pay | Admitting: *Deleted

## 2014-01-20 NOTE — Telephone Encounter (Signed)
-----   Message from Pixie Casino, MD sent at 01/20/2014  3:39 PM EST ----- Regarding: RE: Med question The swelling is not cardiac - keep her metoprolol dose the same.  Dr. Lemmie Evens  ----- Message -----    From: Andrea Lowery    Sent: 01/20/2014   3:23 PM      To: Pixie Casino, MD, Fidel Levy, RN Subject: Med question                                   Dr.Hilty, I spoke with Andrea Lowery and let her know her lab results you reviewed (BNP zero, keep lasix and potassium the same) and she stated that she is still having heavy swelling in her feet and ankles and wanted you to be aware. Also she thinks you told her you may changed her Metoprolol and questioned if she needed to change that dosage.  Thanks!

## 2014-01-20 NOTE — Telephone Encounter (Signed)
Informed patient of Dr.Hilty's message. Let her know she could contact PCP if she was still concerned about her swelling since it is non-Cardiac related

## 2014-01-24 ENCOUNTER — Other Ambulatory Visit: Payer: Self-pay | Admitting: Hematology and Oncology

## 2014-01-24 DIAGNOSIS — D472 Monoclonal gammopathy: Secondary | ICD-10-CM

## 2014-01-25 ENCOUNTER — Encounter: Payer: Self-pay | Admitting: Hematology and Oncology

## 2014-01-25 ENCOUNTER — Ambulatory Visit (HOSPITAL_BASED_OUTPATIENT_CLINIC_OR_DEPARTMENT_OTHER): Payer: Medicare HMO | Admitting: Lab

## 2014-01-25 ENCOUNTER — Telehealth: Payer: Self-pay | Admitting: Hematology and Oncology

## 2014-01-25 ENCOUNTER — Ambulatory Visit (HOSPITAL_BASED_OUTPATIENT_CLINIC_OR_DEPARTMENT_OTHER): Payer: Medicare HMO | Attending: Cardiovascular Disease

## 2014-01-25 ENCOUNTER — Ambulatory Visit (HOSPITAL_BASED_OUTPATIENT_CLINIC_OR_DEPARTMENT_OTHER): Payer: Medicare HMO | Admitting: Hematology and Oncology

## 2014-01-25 VITALS — Ht 67.0 in | Wt 194.0 lb

## 2014-01-25 VITALS — BP 162/74 | HR 68 | Temp 98.2°F | Resp 18 | Ht 66.0 in | Wt 193.2 lb

## 2014-01-25 DIAGNOSIS — Z0000 Encounter for general adult medical examination without abnormal findings: Secondary | ICD-10-CM | POA: Insufficient documentation

## 2014-01-25 DIAGNOSIS — E039 Hypothyroidism, unspecified: Secondary | ICD-10-CM

## 2014-01-25 DIAGNOSIS — I10 Essential (primary) hypertension: Secondary | ICD-10-CM

## 2014-01-25 DIAGNOSIS — Z6830 Body mass index (BMI) 30.0-30.9, adult: Secondary | ICD-10-CM | POA: Diagnosis not present

## 2014-01-25 DIAGNOSIS — Z23 Encounter for immunization: Secondary | ICD-10-CM

## 2014-01-25 DIAGNOSIS — G4733 Obstructive sleep apnea (adult) (pediatric): Secondary | ICD-10-CM

## 2014-01-25 DIAGNOSIS — D472 Monoclonal gammopathy: Secondary | ICD-10-CM

## 2014-01-25 DIAGNOSIS — G4710 Hypersomnia, unspecified: Secondary | ICD-10-CM | POA: Insufficient documentation

## 2014-01-25 DIAGNOSIS — R600 Localized edema: Secondary | ICD-10-CM

## 2014-01-25 DIAGNOSIS — Z299 Encounter for prophylactic measures, unspecified: Secondary | ICD-10-CM

## 2014-01-25 LAB — COMPREHENSIVE METABOLIC PANEL (CC13)
ALT: 27 U/L (ref 0–55)
AST: 18 U/L (ref 5–34)
Albumin: 3.5 g/dL (ref 3.5–5.0)
Alkaline Phosphatase: 78 U/L (ref 40–150)
Anion Gap: 6 mEq/L (ref 3–11)
BUN: 9.9 mg/dL (ref 7.0–26.0)
CO2: 28 mEq/L (ref 22–29)
Calcium: 9.4 mg/dL (ref 8.4–10.4)
Chloride: 106 mEq/L (ref 98–109)
Creatinine: 1 mg/dL (ref 0.6–1.1)
EGFR: 65 mL/min/{1.73_m2} — ABNORMAL LOW (ref >90)
Glucose: 105 mg/dl (ref 70–140)
Potassium: 3.3 mEq/L — ABNORMAL LOW (ref 3.5–5.1)
Sodium: 140 mEq/L (ref 136–145)
Total Bilirubin: 0.5 mg/dL (ref 0.20–1.20)
Total Protein: 7.5 g/dL (ref 6.4–8.3)

## 2014-01-25 LAB — CBC WITH DIFFERENTIAL/PLATELET
BASO%: 0.2 % (ref 0.0–2.0)
Basophils Absolute: 0 10*3/uL (ref 0.0–0.1)
EOS%: 2.1 % (ref 0.0–7.0)
Eosinophils Absolute: 0.1 10*3/uL (ref 0.0–0.5)
HCT: 37.9 % (ref 34.8–46.6)
HGB: 12.8 g/dL (ref 11.6–15.9)
LYMPH%: 34.7 % (ref 14.0–49.7)
MCH: 27.3 pg (ref 25.1–34.0)
MCHC: 33.8 g/dL (ref 31.5–36.0)
MCV: 80.8 fL (ref 79.5–101.0)
MONO#: 0.6 10*3/uL (ref 0.1–0.9)
MONO%: 11.1 % (ref 0.0–14.0)
NEUT#: 2.7 10*3/uL (ref 1.5–6.5)
NEUT%: 51.9 % (ref 38.4–76.8)
Platelets: 194 10*3/uL (ref 145–400)
RBC: 4.69 10*6/uL (ref 3.70–5.45)
RDW: 15.4 % — ABNORMAL HIGH (ref 11.2–14.5)
WBC: 5.1 10*3/uL (ref 3.9–10.3)
lymph#: 1.8 10*3/uL (ref 0.9–3.3)

## 2014-01-25 MED ORDER — PNEUMOCOCCAL 13-VAL CONJ VACC IM SUSP
0.5000 mL | Freq: Once | INTRAMUSCULAR | Status: AC
  Administered 2014-01-25: 0.5 mL via INTRAMUSCULAR
  Filled 2014-01-25: qty 0.5

## 2014-01-25 NOTE — Assessment & Plan Note (Signed)
This appeared to be chronic in nature, according to the patient. She will continue medical management under the care of her PCP/cardiologist

## 2014-01-25 NOTE — Patient Instructions (Signed)

## 2014-01-25 NOTE — Progress Notes (Signed)
[place] progress notes  Patient Care Team: Saralyn Pilar as PCP - General (Family Medicine) Bo Merino, MD as Referring Physician (Rheumatology)  CHIEF COMPLAINTS/PURPOSE OF VISIT:  IgG kappa MGUS  HISTORY OF PRESENTING ILLNESS:  Andrea Lowery 73 y.o. female was transferred to my care after her prior physician has left.  I reviewed the patient's records extensive and collaborated the history with the patient. Summary of her history is as follows: She has a long-standing history of multiple arthralgias, left CMC pain, OA of the bilateral knees, hands and feet and osteoporosis for which she is on Prolia previously. She had SPEP drawn on 02/19/2013 that revealed a restricted band with monoclonal protein present. The monoclonal protein peak accounted for 1.38 g/dL of the total 1.66 g/dL of protein in the gamma region. Her CMP demonstrated a creatinine of 0.81 and calcium of 9.0 and total protein of 7.2 and albumin of 3.9; Her CBC was within normal limits with a hemoglobin of 12.5. Skeletal x-ray show no evidence of lytic lesions. She had a bone marrow biopsy on 06/22/2013 which show 10% of plasma cells. She was observed.  She complain of chronic fatigue. She has chronic bilateral leg edema. Denies recent cough or infection. She have chronic arthritis pain but no new bone pain. MEDICAL HISTORY:  Past Medical History  Diagnosis Date  . Asthma   . Hypertension   . OSA on CPAP   . Dyslipidemia   . Hypothyroidism   . LBBB (left bundle branch block)   . MGUS (monoclonal gammopathy of unknown significance)     SURGICAL HISTORY: Past Surgical History  Procedure Laterality Date  . Cholecystectomy  1976  . Tonsillectomy  1965  . Abdominal hysterectomy  1969  . Plantar fascia release    . US echocardiography  09/20/2008    borderline LVH,mild TR,AOV mildly sclerotic w/ca+ of the leaflets  . Nm myoview ltd  02/13/2010    No ischemia    SOCIAL  HISTORY: History   Social History  . Marital Status: Divorced    Spouse Name: N/A    Number of Children: N/A  . Years of Education: N/A   Occupational History  . Not on file.   Social History Main Topics  . Smoking status: Former Smoker    Quit date: 02/04/2006  . Smokeless tobacco: Not on file  . Alcohol Use: No  . Drug Use: No  . Sexual Activity: Not on file   Other Topics Concern  . Not on file   Social History Narrative    FAMILY HISTORY: Family History  Problem Relation Age of Onset  . Diabetes Father   . Heart failure Father     ALLERGIES:  is allergic to bee venom and penicillins.  MEDICATIONS:  Current Outpatient Prescriptions  Medication Sig Dispense Refill  . acetaminophen (TYLENOL) 500 MG tablet Take 500 mg by mouth every 6 (six) hours as needed for mild pain.    Marland Kitchen albuterol (PROVENTIL HFA;VENTOLIN HFA) 108 (90 BASE) MCG/ACT inhaler Inhale 2 puffs into the lungs every 4 (four) hours as needed for shortness of breath. For shortness of breath and wheezing    . beclomethasone (QVAR) 80 MCG/ACT inhaler Inhale 2 puffs into the lungs 2 (two) times daily.    . cetirizine (ZYRTEC) 10 MG tablet Take 10 mg by mouth daily as needed for allergies.    . cycloSPORINE (RESTASIS) 0.05 % ophthalmic emulsion Place 1 drop into both eyes 2 (two) times daily.     Marland Kitchen  diclofenac sodium (VOLTAREN) 1 % GEL Apply 2 g topically daily as needed (Knee pain).     Marland Kitchen doxycycline (VIBRA-TABS) 100 MG tablet Take 100 mg by mouth as directed.    Marland Kitchen EPINEPHrine 0.3 mg/0.3 mL IJ SOAJ injection Inject 0.3 mg into the muscle daily as needed (bee stings).     Marland Kitchen esomeprazole (NEXIUM) 40 MG capsule Take 40 mg by mouth 2 (two) times daily.     . fluconazole (DIFLUCAN) 150 MG tablet as directed. Take 1 tablet by mouth for one dose. May repeat dose in 3 days.    . fluticasone (FLONASE) 50 MCG/ACT nasal spray as needed.    . furosemide (LASIX) 20 MG tablet Take 2 tablets (40 mg total) by mouth daily. 60  tablet 6  . metoprolol succinate (TOPROL XL) 25 MG 24 hr tablet Take 0.5 tablets (12.5 mg total) by mouth daily. 15 tablet 6  . montelukast (SINGULAIR) 10 MG tablet Take 10 mg by mouth at bedtime.    . Olopatadine HCl (PATADAY) 0.2 % SOLN Apply 1 drop to eye daily as needed (Allergies).     . potassium chloride 20 MEQ TBCR Take 2 tablets (65mq) by mouth daily. 60 tablet 6  . rosuvastatin (CRESTOR) 40 MG tablet Take 1 tablet (40 mg total) by mouth daily. 30 tablet 6  . traMADol (ULTRAM) 50 MG tablet as needed.  0  . traZODone (DESYREL) 50 MG tablet Take 50 mg by mouth every 6 (six) hours as needed for sleep.    . valsartan-hydrochlorothiazide (DIOVAN-HCT) 160-12.5 MG per tablet Take 1 tablet by mouth daily.     Current Facility-Administered Medications  Medication Dose Route Frequency Provider Last Rate Last Dose  . pneumococcal 13-valent conjugate vaccine (PREVNAR 13) injection 0.5 mL  0.5 mL Intramuscular Once NHeath Lark MD        REVIEW OF SYSTEMS:   Constitutional: Denies fevers, chills or abnormal night sweats Eyes: Denies blurriness of vision, double vision or watery eyes Ears, nose, mouth, throat, and face: Denies mucositis or sore throat Respiratory: Denies cough, dyspnea or wheezes Cardiovascular: Denies palpitation, chest discomfort Gastrointestinal:  Denies nausea, heartburn or change in bowel habits Skin: Denies abnormal skin rashes Lymphatics: Denies new lymphadenopathy or easy bruising Neurological:Denies numbness, tingling or new weaknesses Behavioral/Psych: Mood is stable, no new changes  All other systems were reviewed with the patient and are negative.  PHYSICAL EXAMINATION: ECOG PERFORMANCE STATUS: 1 - Symptomatic but completely ambulatory  Filed Vitals:   01/25/14 1159  BP: 162/74  Pulse: 68  Temp: 98.2 F (36.8 C)  Resp: 18   Filed Weights   01/25/14 1159  Weight: 193 lb 3.2 oz (87.635 kg)    GENERAL:alert, no distress and comfortable SKIN: skin  color, texture, turgor are normal, no rashes or significant lesions EYES: normal, conjunctiva are pink and non-injected, sclera clear OROPHARYNX:no exudate, normal lips, buccal mucosa, and tongue  NECK: supple, thyroid normal size, non-tender, without nodularity LYMPH:  no palpable lymphadenopathy in the cervical, axillary or inguinal LUNGS: clear to auscultation and percussion with normal breathing effort HEART: regular rate & rhythm and no murmurs with moderate bilateral lower extremity edema ABDOMEN:abdomen soft, non-tender and normal bowel sounds Musculoskeletal:no cyanosis of digits and no clubbing  PSYCH: alert & oriented x 3 with fluent speech NEURO: no focal motor/sensory deficits  LABORATORY DATA:  I have reviewed the data as listed Lab Results  Component Value Date   WBC 5.1 01/25/2014   HGB 12.8 01/25/2014  HCT 37.9 01/25/2014   MCV 80.8 01/25/2014   PLT 194 01/25/2014    Recent Labs  05/20/13 1030  07/07/13 1912 09/28/13 1005 01/17/14 1400  NA 139  < > 141 139 138  K 3.8  < > 3.3* 3.3* 3.8  CL  --   --  103  --  98  CO2 29  < > _0 GLUCOSE 116  < > 141* 154* 106*  BUN 10.2  < > 10 6.6* 12  CREATININE 0.9  < > 0.87 0.9 0.93  CALCIUM 9.9  < > 9.6 9.5 9.9  GFRNONAA  --   --  65*  --   --   GFRAA  --   --  76*  --   --   PROT 8.2  --   --  7.9  --   ALBUMIN 3.8  --   --  3.7  --   AST 20  --   --  17  --   ALT 28  --   --  18  --   ALKPHOS 84  --   --  77  --   BILITOT 0.56  --   --  0.66  --   < > = values in this interval not displayed.  ASSESSMENT & PLAN:  MGUS (monoclonal gammopathy of unknown significance) Clinically, she has no signs of disease progression. Due to her high-risk situation, I will see her back in 3 months with repeat blood work and imaging study to be done the week ahead of time so that we can have results before see her back.  Bilateral leg edema This appeared to be chronic in nature, according to the patient. She will continue  medical management under the care of her PCP/cardiologist  Preventive measure The patient would qualify for pneumococcal 13 injection today. We will proceed.   Orders Placed This Encounter  Procedures  . DG Bone Survey Met    Standing Status: Future     Number of Occurrences:      Standing Expiration Date: 03/27/2015    Order Specific Question:  Reason for Exam (SYMPTOM  OR DIAGNOSIS REQUIRED)    Answer:  staging myeloma    Order Specific Question:  Preferred imaging location?    Answer:  [facility]  . Comprehensive metabolic panel    Standing Status: Future     Number of Occurrences:      Standing Expiration Date: 03/27/2015  . CBC with Differential    Standing Status: Future     Number of Occurrences:      Standing Expiration Date: 03/27/2015  . SPEP & IFE with QIG    Standing Status: Future     Number of Occurrences:      Standing Expiration Date: 03/27/2015  . Kappa/lambda light chains    Standing Status: Future     Number of Occurrences:      Standing Expiration Date: 03/27/2015  . Beta 2 microglobulin, serum    Standing Status: Future     Number of Occurrences:      Standing Expiration Date: 03/27/2015  . Lactate dehydrogenase    Standing Status: Future     Number of Occurrences:      Standing Expiration Date: 03/27/2015    All questions were answered. The patient knows to call the clinic with any problems, questions or concerns. I spent 25 minutes counseling the patient face to face. The total time spent in the appointment was 25  minutes and more than 50% was on counseling.     [facility], [place], MD 01/25/2014 12:18 PM

## 2014-01-25 NOTE — Assessment & Plan Note (Signed)
The patient would qualify for pneumococcal 13 injection today. We will proceed.

## 2014-01-25 NOTE — Assessment & Plan Note (Signed)
Clinically, she has no signs of disease progression. Due to her high-risk situation, I will see her back in 3 months with repeat blood work and imaging study to be done the week ahead of time so that we can have results before see her back.

## 2014-01-25 NOTE — Telephone Encounter (Signed)
gv and printed appt sched and avs forpt for March 2016 °

## 2014-01-27 LAB — SPEP & IFE WITH QIG
Albumin ELP: 51.5 % — ABNORMAL LOW (ref 55.8–66.1)
Alpha-1-Globulin: 4.8 % (ref 2.9–4.9)
Alpha-2-Globulin: 10.7 % (ref 7.1–11.8)
Beta 2: 4.9 % (ref 3.2–6.5)
Beta Globulin: 5.3 % (ref 4.7–7.2)
Gamma Globulin: 22.8 % — ABNORMAL HIGH (ref 11.1–18.8)
IgA: 86 mg/dL (ref 69–380)
IgG (Immunoglobin G), Serum: 1710 mg/dL — ABNORMAL HIGH (ref 690–1700)
IgM, Serum: 32 mg/dL — ABNORMAL LOW (ref 52–322)
M-Spike, %: 1.2 g/dL
Total Protein, Serum Electrophoresis: 7 g/dL (ref 6.0–8.3)

## 2014-01-27 LAB — KAPPA/LAMBDA LIGHT CHAINS
Kappa free light chain: 1.92 mg/dL (ref 0.33–1.94)
Kappa:Lambda Ratio: 1.02 (ref 0.26–1.65)
Lambda Free Lght Chn: 1.88 mg/dL (ref 0.57–2.63)

## 2014-01-27 LAB — BETA 2 MICROGLOBULIN, SERUM: Beta-2 Microglobulin: 2.09 mg/L (ref <=2.51)

## 2014-01-30 ENCOUNTER — Emergency Department (HOSPITAL_COMMUNITY)
Admission: EM | Admit: 2014-01-30 | Discharge: 2014-01-30 | Disposition: A | Discharge status: Home / Self Care | Payer: Medicare HMO | Attending: Emergency Medicine | Admitting: Emergency Medicine

## 2014-01-30 ENCOUNTER — Emergency Department (HOSPITAL_COMMUNITY): Payer: Medicare HMO

## 2014-01-30 ENCOUNTER — Encounter (HOSPITAL_COMMUNITY): Payer: Self-pay | Admitting: *Deleted

## 2014-01-30 DIAGNOSIS — Z9981 Dependence on supplemental oxygen: Secondary | ICD-10-CM | POA: Insufficient documentation

## 2014-01-30 DIAGNOSIS — Z87891 Personal history of nicotine dependence: Secondary | ICD-10-CM | POA: Diagnosis not present

## 2014-01-30 DIAGNOSIS — Z79899 Other long term (current) drug therapy: Secondary | ICD-10-CM | POA: Insufficient documentation

## 2014-01-30 DIAGNOSIS — Z880 Allergy status to penicillin: Secondary | ICD-10-CM | POA: Insufficient documentation

## 2014-01-30 DIAGNOSIS — G4733 Obstructive sleep apnea (adult) (pediatric): Secondary | ICD-10-CM | POA: Diagnosis not present

## 2014-01-30 DIAGNOSIS — E785 Hyperlipidemia, unspecified: Secondary | ICD-10-CM | POA: Diagnosis not present

## 2014-01-30 DIAGNOSIS — R05 Cough: Secondary | ICD-10-CM

## 2014-01-30 DIAGNOSIS — I10 Essential (primary) hypertension: Secondary | ICD-10-CM | POA: Diagnosis not present

## 2014-01-30 DIAGNOSIS — Z862 Personal history of diseases of the blood and blood-forming organs and certain disorders involving the immune mechanism: Secondary | ICD-10-CM | POA: Diagnosis not present

## 2014-01-30 DIAGNOSIS — J45909 Unspecified asthma, uncomplicated: Secondary | ICD-10-CM | POA: Diagnosis not present

## 2014-01-30 DIAGNOSIS — Z7951 Long term (current) use of inhaled steroids: Secondary | ICD-10-CM | POA: Diagnosis not present

## 2014-01-30 DIAGNOSIS — J069 Acute upper respiratory infection, unspecified: Secondary | ICD-10-CM | POA: Insufficient documentation

## 2014-01-30 DIAGNOSIS — R059 Cough, unspecified: Secondary | ICD-10-CM

## 2014-01-30 LAB — BASIC METABOLIC PANEL
Anion gap: 3 — ABNORMAL LOW (ref 5–15)
BUN: 12 mg/dL (ref 6–23)
CO2: 30 mmol/L (ref 19–32)
Calcium: 9.4 mg/dL (ref 8.4–10.5)
Chloride: 106 mEq/L (ref 96–112)
Creatinine, Ser: 0.82 mg/dL (ref 0.50–1.10)
GFR calc Af Amer: 81 mL/min — ABNORMAL LOW (ref >90)
GFR calc non Af Amer: 70 mL/min — ABNORMAL LOW (ref >90)
Glucose, Bld: 103 mg/dL — ABNORMAL HIGH (ref 70–99)
Potassium: 3.4 mmol/L — ABNORMAL LOW (ref 3.5–5.1)
Sodium: 139 mmol/L (ref 135–145)

## 2014-01-30 LAB — CBC
HCT: 38.1 % (ref 36.0–46.0)
Hemoglobin: 12.7 g/dL (ref 12.0–15.0)
MCH: 27.3 pg (ref 26.0–34.0)
MCHC: 33.3 g/dL (ref 30.0–36.0)
MCV: 81.9 fL (ref 78.0–100.0)
Platelets: 201 10*3/uL (ref 150–400)
RBC: 4.65 MIL/uL (ref 3.87–5.11)
RDW: 15.3 % (ref 11.5–15.5)
WBC: 5.4 10*3/uL (ref 4.0–10.5)

## 2014-01-30 MED ORDER — BENZONATATE 100 MG PO CAPS: 100.0000 mg | ORAL_CAPSULE | Freq: Three times a day (TID) | ORAL | Status: DC | PRN

## 2014-01-30 NOTE — ED Notes (Addendum)
Awake. Verbally responsive. A/O x4. Resp even and unlabored. No audible adventitious breath sounds noted at this time but pt reported having dry cough and nasal drainage. ABC's intact. SR on monitor at 75bpm.

## 2014-01-30 NOTE — ED Notes (Signed)
Awake. Verbally responsive. A/O x4. Resp even and unlabored. No audible adventitious breath sounds noted. ABC's intact. NAD noted. 

## 2014-01-30 NOTE — ED Provider Notes (Signed)
CSN: [id number]     Arrival date & time 01/30/14  1655 History   First MD Initiated Contact with Patient 01/30/14 1858     Chief Complaint  Patient presents with  . Chest Pain  . Cough   HPI Pt saw her doctor on Tuesday.  She was told she  Should get a PNA shot so she was given one.  That evening she started to feel a sore throat and lightheaded.  She then started having trouble with chills , cough and soreness in the ribs in the back over the next few days.  Each day she has felt gradually worse.  No fever measured but she has felt chilled.  No vomiting some loose stools.  Appetite is decreased.  Some muscle aches and body aches. Past Medical History  Diagnosis Date  . Asthma   . Hypertension   . OSA on CPAP   . Dyslipidemia   . Hypothyroidism   . LBBB (left bundle branch block)   . MGUS (monoclonal gammopathy of unknown significance)    Past Surgical History  Procedure Laterality Date  . Cholecystectomy  1976  . Tonsillectomy  1965  . Abdominal hysterectomy  1969  . Plantar fascia release    . US echocardiography  09/20/2008    borderline LVH,mild TR,AOV mildly sclerotic w/ca+ of the leaflets  . Nm myoview ltd  02/13/2010    No ischemia   Family History  Problem Relation Age of Onset  . Diabetes Father   . Heart failure Father    History  Substance Use Topics  . Smoking status: Former Smoker    Quit date: 02/04/2006  . Smokeless tobacco: Not on file  . Alcohol Use: No   OB History    No data available     Review of Systems  HENT: Positive for sore throat and voice change.   All other systems reviewed and are negative.     Allergies  Bee venom and Penicillins  Home Medications   Prior to Admission medications   Medication Sig Start Date End Date Taking? Authorizing Provider  acetaminophen (TYLENOL) 500 MG tablet Take 500 mg by mouth every 6 (six) hours as needed for mild pain.   Yes Historical Provider, MD  albuterol (PROVENTIL HFA;VENTOLIN HFA) 108 (90  BASE) MCG/ACT inhaler Inhale 2 puffs into the lungs every 4 (four) hours as needed for shortness of breath. For shortness of breath and wheezing   Yes Historical Provider, MD  beclomethasone (QVAR) 80 MCG/ACT inhaler Inhale 2 puffs into the lungs 2 (two) times daily.   Yes Historical Provider, MD  cetirizine (ZYRTEC) 10 MG tablet Take 10 mg by mouth daily as needed for allergies.   Yes Historical Provider, MD  cycloSPORINE (RESTASIS) 0.05 % ophthalmic emulsion Place 1 drop into both eyes 2 (two) times daily.    Yes Historical Provider, MD  diclofenac sodium (VOLTAREN) 1 % GEL Apply 2 g topically daily as needed (Knee pain).    Yes Historical Provider, MD  doxycycline (VIBRA-TABS) 100 MG tablet Take 100 mg by mouth as directed. 01/12/14 02/02/14 Yes Historical Provider, MD  EPINEPHrine 0.3 mg/0.3 mL IJ SOAJ injection Inject 0.3 mg into the muscle daily as needed (bee stings).    Yes Historical Provider, MD  esomeprazole (NEXIUM) 40 MG capsule Take 40 mg by mouth 2 (two) times daily.    Yes Historical Provider, MD  fluconazole (DIFLUCAN) 150 MG tablet as directed. Take 1 tablet by mouth for one dose. May repeat  dose in 3 days. 01/12/14  Yes Historical Provider, MD  fluticasone (FLONASE) 50 MCG/ACT nasal spray Place 2 sprays into both nostrils as needed for allergies.  12/27/13  Yes Historical Provider, MD  furosemide (LASIX) 20 MG tablet Take 2 tablets (40 mg total) by mouth daily. 01/13/14  Yes Pixie Casino, MD  metoprolol succinate (TOPROL XL) 25 MG 24 hr tablet Take 0.5 tablets (12.5 mg total) by mouth daily. Patient taking differently: Take 25 mg by mouth daily.  07/29/13  Yes Pixie Casino, MD  montelukast (SINGULAIR) 10 MG tablet Take 10 mg by mouth at bedtime.   Yes Historical Provider, MD  Olopatadine HCl (PATADAY) 0.2 % SOLN Apply 1 drop to eye daily as needed (Allergies).    Yes Historical Provider, MD  potassium chloride 20 MEQ TBCR Take 2 tablets (65mEq) by mouth daily. 01/13/14  Yes  Pixie Casino, MD  rosuvastatin (CRESTOR) 40 MG tablet Take 1 tablet (40 mg total) by mouth daily. 01/13/14  Yes Pixie Casino, MD  traMADol (ULTRAM) 50 MG tablet Take 50 mg by mouth as needed for moderate pain.  12/23/13  Yes Historical Provider, MD  traZODone (DESYREL) 50 MG tablet Take 50 mg by mouth every 6 (six) hours as needed for sleep.   Yes Historical Provider, MD  valsartan-hydrochlorothiazide (DIOVAN-HCT) 160-12.5 MG per tablet Take 1 tablet by mouth daily.   Yes Historical Provider, MD   BP 153/71 mmHg  Pulse 78  Temp(Src) 98.9 F (37.2 C) (Oral)  Resp 20  SpO2 100% Physical Exam  Constitutional: She appears well-developed and well-nourished. No distress.  HENT:  Head: Normocephalic and atraumatic.  Right Ear: External ear normal.  Left Ear: External ear normal.  Eyes: Conjunctivae are normal. Right eye exhibits no discharge. Left eye exhibits no discharge. No scleral icterus.  Neck: Neck supple. No tracheal deviation present.  Cardiovascular: Normal rate, regular rhythm and intact distal pulses.   Pulmonary/Chest: Effort normal and breath sounds normal. No stridor. No respiratory distress. She has no wheezes. She has no rales.  Abdominal: Soft. Bowel sounds are normal. She exhibits no distension. There is no tenderness. There is no rebound and no guarding.  Musculoskeletal: She exhibits no edema or tenderness.  Neurological: She is alert. She has normal strength. No cranial nerve deficit (no facial droop, extraocular movements intact, no slurred speech) or sensory deficit. She exhibits normal muscle tone. She displays no seizure activity. Coordination normal.  Skin: Skin is warm and dry. No rash noted.  Psychiatric: She has a normal mood and affect.  Nursing note and vitals reviewed.   ED Course  Procedures (including critical care time) Labs Review Labs Reviewed  BASIC METABOLIC PANEL - Abnormal; Notable for the following:    Potassium 3.4 (*)    Glucose, Bld  103 (*)    GFR calc non Af Amer 70 (*)    GFR calc Af Amer 81 (*)    Anion gap 3 (*)    All other components within normal limits  CBC  I-STAT TROPOININ, ED    Imaging Review Dg Chest 2 View  01/30/2014   CLINICAL DATA:  Cough and shortness of breath since Tuesday  EXAM: CHEST  2 VIEW  COMPARISON:  07/07/2013  FINDINGS: Cardiac shadow is within normal limits. The lungs are clear bilaterally. No focal infiltrate is seen. No acute bony abnormality is noted.  IMPRESSION: No active cardiopulmonary disease.   Electronically Signed   By: Linus Mako.D.  On: 01/30/2014 19:45     EKG Interpretation   Date/Time:  Sunday January 30 2014 17:02:45 EST Ventricular Rate:  75 PR Interval:  176 QRS Duration: 144 QT Interval:  432 QTC Calculation: 482 R Axis:   81 Text Interpretation:  Sinus arrhythmia Ventricular premature complex  Anterior infarct, old Abnormal T, consider ischemia, lateral leads since  last tracing no significant change Confirmed by KOHUT  MD, STEPHEN ([id number])  on 01/30/2014 5:08:18 PM      MDM   Final diagnoses:  Cough  URI, acute    Symptoms are consistent with a simple upper respiratory infection. There is no evidence to suggest pneumonia on my exam. . I discussed supportive treatment. I encouraged followup with the primary care doctor next week if symptoms have not resolved. Warning signs and reasons to return to the emergency room were discussed      Dorie Rank, MD 01/30/14 2104

## 2014-01-30 NOTE — ED Notes (Signed)
Pt reports chest pain and non productive cough since this morning. Pain 4/10. Reports some abdominal pain and back pain present as well.

## 2014-01-30 NOTE — Discharge Instructions (Signed)
Cough, Adult   A cough is a reflex. It helps you clear your throat and airways. A cough can help heal your body. A cough can last 2 or 3 weeks (acute) or may last more than 8 weeks (chronic). Some common causes of a cough can include an infection, allergy, or a cold.  HOME CARE  · Only take medicine as told by your doctor.  · If given, take your medicines (antibiotics) as told. Finish them even if you start to feel better.  · Use a cold steam vaporizer or humidifier in your home. This can help loosen thick spit (secretions).  · Sleep so you are almost sitting up (semi-upright). Use pillows to do this. This helps reduce coughing.  · Rest as needed.  · Stop smoking if you smoke.  GET HELP RIGHT AWAY IF:  · You have yellowish-white fluid (pus) in your thick spit.  · Your cough gets worse.  · Your medicine does not reduce coughing, and you are losing sleep.  · You cough up blood.  · You have trouble breathing.  · Your pain gets worse and medicine does not help.  · You have a fever.  MAKE SURE YOU:   · Understand these instructions.  · Will watch your condition.  · Will get help right away if you are not doing well or get worse.  Document Released: 10/04/2010 Document Revised: 06/07/2013 Document Reviewed: 10/04/2010  ExitCare® Patient Information ©2015 [employer]. This information is not intended to replace advice given to you by your health care provider. Make sure you discuss any questions you have with your health care provider.

## 2014-02-09 NOTE — Sleep Study (Signed)
NAME: Andrea Lowery DATE OF BIRTH:  01-Mar-1940 MEDICAL RECORD NUMBER [id number]  LOCATION: [facility]  PHYSICIAN: Kaelan Amble A  DATE OF STUDY: 01/25/2014  SLEEP STUDY TYPE: Nocturnal Polysomnogram               REFERRING PHYSICIAN: Troy Sine, MD  INDICATION FOR STUDY:  The patient is a 74 year old African-American female who is previously been diagnosed with obstructive sleep apnea and had used BiPAP therapy at a pressure of 14/10.  He has not used therapy in over a year and no longer has a machine.  She is now referred for split night study for reassessment of her sleep apnea and reinitiation of therapy.  EPWORTH SLEEPINESS SCORE:  18, consistent with significant hypersomnolence. HEIGHT: 5\' 7"  (170.2 cm)  WEIGHT: 194 lb (87.998 kg)    Body mass index is 30.38 kg/(m^2).  NECK SIZE: 14.5 in.  MEDICATIONS:   valsartan-hydrochlorothiazide (DIOVAN-HCT) 160-12.5 MG per tablet 1 tablet, Daily traZODone (DESYREL) 50 MG tablet 50 mg, Every 6 hours PRN traMADol (ULTRAM) 50 MG tablet 50 mg, As needed  Note: Received from: External Pharmacy Received Sig: (Written 01/13/2014 1327)  rosuvastatin (CRESTOR) 40 MG tablet 40 mg, Daily potassium chloride 20 MEQ TBCR Olopatadine HCl (PATADAY) 0.2 % SOLN 1 drop, Daily PRN montelukast (SINGULAIR) 10 MG tablet 10 mg, Daily at bedtime metoprolol succinate (TOPROL XL) 25 MG 24 hr tablet 12.5 mg, Daily  Patient taking differently: 25 mg Oral Daily, Reason: Patient Preference, Informant: Self, Reported on 01/30/2014  furosemide (LASIX) 20 MG tablet 40 mg, Daily fluticasone (FLONASE) 50 MCG/ACT nasal spray 2 spray, As needed  Note: . (Written 01/30/2014 1904)  fluconazole (DIFLUCAN) 150 MG tablet As directed  Note: Has one pill left. And can repeat if needed. (Written 01/30/2014 1904)  esomeprazole (NEXIUM) 40 MG capsule 40 mg, 2 times daily EPINEPHrine 0.3 mg/0.3 mL IJ SOAJ injection 0.3 mg, Daily PRN diclofenac sodium  (VOLTAREN) 1 % GEL 2 g, Daily PRN cycloSPORINE (RESTASIS) 0.05 % ophthalmic emulsion 1 drop, 2 times daily cetirizine (ZYRTEC) 10 MG tablet 10 mg, Daily PRN benzonatate (TESSALON PERLES) 100 MG capsule 100 mg, 3 times daily PRN beclomethasone (QVAR) 80 MCG/ACT inhaler 2 puff, 2 times daily albuterol (PROVENTIL HFA;VENTOLIN HFA) 108 (90 BASE) MCG/ACT inhaler 2 puff, Every 4 hours PRN acetaminophen (TYLENOL) 500 MG tablet  SLEEP ARCHITECTURE:  Although the patient was scheduled for a split-night study, she had inadequate respiratory events to meet split-night criteria and a diagnostic polysomnogram was only performed. Total sleep time was 343 minutes out of a sleep period time of 417 minutes, giving a sleep efficiency at 79.7%.  Sleep latency was 11 minutes which is normal.  She had delayed latency to REM sleep at 135 minutes.  She spent 18.5 minutes in stage I (5.4%), 265.5 minutes in stage II (77.4%) and 59 minutes in rem sleep (17.2%).  She slept 149 minutes with supine sleep, of which 41.5 minutes was REM sleep.  RESPIRATORY DATA:  Point of sleep period time, the patient had one obstructive apneic event  and 1 hypopneic event which occurred with REM supine sleep.  The AHI was 0.3 and respiratory disturbance index was 0.7.  There was evidence for mild to moderate snoring.  The arousal index was normal at 1.7/hr.  OXYGEN DATA:  The baseline oxygen saturation was 98%.  The oxygen nadir with non-REM sleep was 93% and during REM sleep was 91%.  CARDIAC DATA:  The average heart beat  was 67 bpm.  Rhythm was sinus rhythm without arrhythmia  MOVEMENT/PARASOMNIA:  0 periodic limb movements were observed.  IMPRESSION/ RECOMMENDATION:   No evidence for obstructive sleep apnea but mild increased upper airway resistance.  No significant oxygen desaturation. Significant hypersomnolence with an Epworth Sleepiness Scale score of 18.  Consider referral for an MLST to assess for idiopathic hypersomnolence versus  narcolepsy. At present, there is no indication for CPAP therapy.  Consider alternatives for the treatment of snoring.   [place], American Board of Sleep Medicine  ELECTRONICALLY SIGNED ON:  02/09/2014, 12:10 PM [place] PH: [phone]   FX: [phone] [place]

## 2014-02-09 NOTE — Addendum Note (Signed)
Addended by: Shelva Majestic A on: 02/09/2014 12:33 PM   Modules accepted: Level of Service

## 2014-02-14 ENCOUNTER — Ambulatory Visit: Payer: Medicare HMO | Admitting: Cardiovascular Disease

## 2014-02-15 ENCOUNTER — Encounter: Payer: Medicare HMO | Attending: Family Medicine

## 2014-02-15 VITALS — Ht 66.0 in | Wt 198.5 lb

## 2014-02-15 DIAGNOSIS — Z713 Dietary counseling and surveillance: Secondary | ICD-10-CM | POA: Diagnosis not present

## 2014-02-15 DIAGNOSIS — E119 Type 2 diabetes mellitus without complications: Secondary | ICD-10-CM | POA: Insufficient documentation

## 2014-02-18 NOTE — Progress Notes (Signed)

## 2014-02-22 DIAGNOSIS — E119 Type 2 diabetes mellitus without complications: Secondary | ICD-10-CM

## 2014-02-22 NOTE — Progress Notes (Signed)

## 2014-03-01 DIAGNOSIS — E119 Type 2 diabetes mellitus without complications: Secondary | ICD-10-CM | POA: Diagnosis not present

## 2014-03-01 NOTE — Progress Notes (Signed)
Patient was seen on 03/01/14 for the third of a series of three diabetes self-management courses at the [facility]. The following learning objectives were met by the patient during this class:  . State the amount of activity recommended for healthy living . Describe activities suitable for individual needs . Identify ways to regularly incorporate activity into daily life . Identify barriers to activity and ways to over come these barriers  Identify diabetes medications being personally used and their primary action for lowering glucose and possible side effects . Describe role of stress on blood glucose and develop strategies to address psychosocial issues . Identify diabetes complications and ways to prevent them  Explain how to manage diabetes during illness . Evaluate success in meeting personal goal . Establish 2-3 goals that they will plan to diligently work on until they return for the  76-monthfollow-up visit  Goals:   I will take my diabetes medications as scheduled  To help manage stress I will exercise at least 3 times a week  Your patient has identified these potential barriers to change:  Finances Stress  Your patient has identified their diabetes self-care support plan as  [facility] Plan:  Attend Core 4 in 4 months

## 2014-03-18 DIAGNOSIS — F419 Anxiety disorder, unspecified: Secondary | ICD-10-CM | POA: Insufficient documentation

## 2014-03-18 DIAGNOSIS — F331 Major depressive disorder, recurrent, moderate: Secondary | ICD-10-CM | POA: Insufficient documentation

## 2014-03-18 DIAGNOSIS — F330 Major depressive disorder, recurrent, mild: Secondary | ICD-10-CM | POA: Insufficient documentation

## 2014-04-06 ENCOUNTER — Other Ambulatory Visit: Payer: Self-pay

## 2014-04-06 DIAGNOSIS — Z1231 Encounter for screening mammogram for malignant neoplasm of breast: Secondary | ICD-10-CM

## 2014-04-12 ENCOUNTER — Ambulatory Visit
Admission: RE | Admit: 2014-04-12 | Discharge: 2014-04-12 | Disposition: A | Discharge status: Home / Self Care | Payer: Medicare HMO | Source: Ambulatory Visit

## 2014-04-12 DIAGNOSIS — Z1231 Encounter for screening mammogram for malignant neoplasm of breast: Secondary | ICD-10-CM

## 2014-04-15 ENCOUNTER — Telehealth: Payer: Self-pay | Admitting: Cardiovascular Disease

## 2014-04-15 ENCOUNTER — Ambulatory Visit (INDEPENDENT_AMBULATORY_CARE_PROVIDER_SITE_OTHER): Payer: Medicare HMO | Admitting: Cardiovascular Disease

## 2014-04-15 VITALS — BP 137/84 | HR 90 | Ht 67.0 in | Wt 190.8 lb

## 2014-04-15 DIAGNOSIS — I10 Essential (primary) hypertension: Secondary | ICD-10-CM | POA: Diagnosis not present

## 2014-04-15 DIAGNOSIS — R0683 Snoring: Secondary | ICD-10-CM | POA: Diagnosis not present

## 2014-04-15 DIAGNOSIS — I447 Left bundle-branch block, unspecified: Secondary | ICD-10-CM | POA: Diagnosis not present

## 2014-04-15 DIAGNOSIS — G4710 Hypersomnia, unspecified: Secondary | ICD-10-CM

## 2014-04-15 MED ORDER — ESZOPICLONE 2 MG PO TABS: 2.0000 mg | ORAL_TABLET | Freq: Every evening | ORAL | Status: DC | PRN

## 2014-04-15 NOTE — Patient Instructions (Signed)
Your physician has recommended you make the following change in your medication: STOP the trazadone. Start new prescription given today for Lunesta.  Your physician recommends that you schedule a follow-up appointment in: 3 months

## 2014-04-15 NOTE — Telephone Encounter (Signed)
Mrs. Newmann is calling about the prescription in which she was given . Needs a pre- authorization . Please call    Thanks

## 2014-04-15 NOTE — Telephone Encounter (Signed)
Returned call to patient she stated Andrea Lowery needs a prior authorization.[employer] called [phone] ID # [id number].Proir Authorization started will take 24 to 72 hours for approval.

## 2014-04-16 ENCOUNTER — Encounter: Payer: Self-pay | Admitting: Cardiovascular Disease

## 2014-04-16 DIAGNOSIS — R0683 Snoring: Secondary | ICD-10-CM | POA: Insufficient documentation

## 2014-04-16 DIAGNOSIS — G4710 Hypersomnia, unspecified: Secondary | ICD-10-CM | POA: Insufficient documentation

## 2014-04-16 NOTE — Progress Notes (Signed)
Patient ID: MARLYN TONDREAU, female   DOB: 1940/10/07, 74 y.o.   MRN: [id number]     HPI: JANIAH DEVINNEY is a 74 y.o. female who presents to the sleep clinic in follow-up of her recent repeat sleep study and evaluation of hypersomnolence.  Ms. Shain has a history of hypertension, hyperlipidemia, hypothyroidism, as well as peripheral edema.  In August 2013 she was referred for a diagnostic polysomnogram.  At that time her Epworth sleepiness scale score endorsed at 18 was consistent with significant daytime sleepiness.  She was found to have mild sleep apnea overall with a AHI 5.4, although her RDI was 13.2 per hour.  She had moderately severe sleep apnea with REM sleep with an AHI of 25.2 and RDI of 34.8 during REM sleep.  CPAP was initially started, but she was unable to tolerate higher pressures, and ultimately was switched to BiPAP therapy.  In December 2013, on BiPAP therapy, pressure of 14/10.  She was doing well and her Epworth Sleepiness Scale score had significantly improved to 3.  He was seen in the sleep clinic at that time and on an IPAP pressure of 14 and EPAP pressure 10, her AHI was 0.9.  At that time, she felt well.  She denied daytime sleepiness.  Her sleep was restorative.  Ms. Gayden had changed several doctors over the past several years.  She  stopped using her BiPAP unit and last year may have thrown this out since it was not working.  She has not used treatment in over a year.  She presented to me for evaluation in October 2015 with complaints that, she had noticed that she now has similar symptoms that she had had prior to initiating CPAP/BiPAP therapy.  She admitted to frequent awakenings, hypersomnolence, and significant daytime fatigue.   She also admits to mild ankle swelling and recent elevation of her blood pressure.  At that office visit, and Epworth sleepiness scale score  was calculated today and  endorsed at 19 consistent with severe hypersomnolenceas shown  below.   Epworth Sleepiness Scale: Situation   Chance of Dozing/Sleeping (0 = never , 1 = slight chance , 2 = moderate chance , 3 = high chance )   sitting and reading 3   watching TV 3   sitting inactive in a public place 2   being a passenger in a motor vehicle for an hour or more 3   lying down in the afternoon 3   sitting and talking to someone 2   sitting quietly after lunch (no alcohol) 3   while stopped for a few minutes in traffic as the driver 0   Total Score  19   Subsequently, Ms. Buckel was referred for follow-up sleep study which was done at [facility].  Although she was scheduled for split night evaluation, she had inadequate respiratory events to meet split-night criteria.  Throughout the entire recorded.  She only had one obstructive apneic event and 1.  Hypoxic event which occurred with REM supine sleep.  Her AHI was 0.3 and RDI was 0.7.  She had mild to moderate snoring.  Her arousal index was normal at 1.7.  She did not have any oxygen desaturation with baseline oxygen at 98% and with a nadir of 91% during REM sleep.  She had 0 periodic lid movements.  Consequently, it was felt that she did not have obstructive sleep apnea but only mild increased upper airway resistance.  However, at the time of her  study.  Her Epworth Sleepiness Scale score again calculated 18.  Upon further questioning today office.  She states that she typically goes to bed at 11 PM and wakes up at 3 AM.  She has very poor sleep maintenance.  She is unable to typically get back to sleep after 3 AM and often gets out of bed at 6 AM.  As result, she has to take at least 2 naps per day for minimum of 30 minutes.  She has tried several sleep aids including trazodone, which has not been helpful.  She presents for evaluation.  A new Epworth Sleepiness Scale score was recalculated today.  This was consistent and endorsed at 39.  Past Medical History  Diagnosis Date  . Asthma   . Hypertension   .  OSA on CPAP   . Dyslipidemia   . Hypothyroidism   . LBBB (left bundle branch block)   . MGUS (monoclonal gammopathy of unknown significance)   . Diabetes mellitus without complication     Past Surgical History  Procedure Laterality Date  . Cholecystectomy  1976  . Tonsillectomy  1965  . Abdominal hysterectomy  1969  . Plantar fascia release    . US echocardiography  09/20/2008    borderline LVH,mild TR,AOV mildly sclerotic w/ca+ of the leaflets  . Nm myoview ltd  02/13/2010    No ischemia    Allergies  Allergen Reactions  . Bee Venom Anaphylaxis    Yellow jackets  . Penicillins Hives    Current Outpatient Prescriptions  Medication Sig Dispense Refill  . acetaminophen (TYLENOL) 500 MG tablet Take 500 mg by mouth every 6 (six) hours as needed for mild pain.    Marland Kitchen albuterol (PROVENTIL HFA;VENTOLIN HFA) 108 (90 BASE) MCG/ACT inhaler Inhale 2 puffs into the lungs every 4 (four) hours as needed for shortness of breath. For shortness of breath and wheezing    . beclomethasone (QVAR) 80 MCG/ACT inhaler Inhale 2 puffs into the lungs 2 (two) times daily.    . cetirizine (ZYRTEC) 10 MG tablet Take 10 mg by mouth daily as needed for allergies.    . cycloSPORINE (RESTASIS) 0.05 % ophthalmic emulsion Place 1 drop into both eyes 2 (two) times daily.     . diclofenac sodium (VOLTAREN) 1 % GEL Apply 2 g topically daily as needed (Knee pain).     Marland Kitchen EPINEPHrine 0.3 mg/0.3 mL IJ SOAJ injection Inject 0.3 mg into the muscle daily as needed (bee stings).     Marland Kitchen esomeprazole (NEXIUM) 40 MG capsule Take 40 mg by mouth 2 (two) times daily.     . fluticasone (FLONASE) 50 MCG/ACT nasal spray Place 2 sprays into both nostrils as needed for allergies.     . furosemide (LASIX) 20 MG tablet Take 2 tablets (40 mg total) by mouth daily. 60 tablet 6  . metoprolol succinate (TOPROL XL) 25 MG 24 hr tablet Take 0.5 tablets (12.5 mg total) by mouth daily. (Patient taking differently: Take 25 mg by mouth daily. ) 15  tablet 6  . montelukast (SINGULAIR) 10 MG tablet Take 10 mg by mouth at bedtime.    . Olopatadine HCl (PATADAY) 0.2 % SOLN Apply 1 drop to eye daily as needed (Allergies).     . rosuvastatin (CRESTOR) 40 MG tablet Take 1 tablet (40 mg total) by mouth daily. 30 tablet 6  . traMADol (ULTRAM) 50 MG tablet Take 50 mg by mouth as needed for moderate pain.   0  . valsartan-hydrochlorothiazide (DIOVAN-HCT) 160-12.5  MG per tablet Take 1 tablet by mouth daily.    . eszopiclone (LUNESTA) 2 MG TABS tablet Take 1 tablet (2 mg total) by mouth at bedtime as needed for sleep. Take immediately before bedtime 30 tablet 1   No current facility-administered medications for this visit.    History   Social History  . Marital Status: Divorced    Spouse Name: N/A  . Number of Children: N/A  . Years of Education: N/A   Occupational History  . Not on file.   Social History Main Topics  . Smoking status: Former Smoker    Quit date: 02/04/2006  . Smokeless tobacco: Not on file  . Alcohol Use: No  . Drug Use: No  . Sexual Activity: Not on file   Other Topics Concern  . Not on file   Social History Narrative     ROS General: Negative; No fevers, chills, or night sweats HEENT: Negative; No changes in vision or hearing, sinus congestion, difficulty swallowing Pulmonary: positive for asthma; No cough,  shortness of breath, hemoptysis Cardiovascular: Negative; No chest pain, presyncope, syncope, palpitations Positive for ankle swelling GI: Negative; No nausea, vomiting, diarrhea, or abdominal pain GU: Negative; No dysuria, hematuria, or difficulty voiding Musculoskeletal: Negative; no myalgias, joint pain, or weakness Hematologic: Negative; no easy bruising, bleeding Immunologic: Positive for seasonal allergies Endocrine: Negative; no heat/cold intolerance Neuro: Negative; no changes in balance, headaches Skin: Negative; No rashes or skin lesions Psychiatric: Negative; No behavioral problems,  depression Sleep: positive for significant hypersomnolence, mild snoring; no bruxism hypnogognic hallucinations, no definitive cataplexy   Physical Exam BP 137/84 mmHg  Pulse 90  Ht 5' 7"  (1.702 m)  Wt 190 lb 12.8 oz (86.546 kg)  BMI 29.88 kg/m2  General: Alert, oriented, no distress.  Skin: normal turgor, no rashes HEENT: Normocephalic, atraumatic. Pupils round and reactive; sclera anicteric; extraocular muscles intact; Fundi without hemorrhages or exudates Nose without nasal septal hypertrophy Mouth/Parynx benign; Mallinpatti scale 3 Neck: No JVD, no carotid briuts Lungs: clear to ausculatation and percussion; no wheezing or rales  Chest wall: No tenderness to palpation Heart: RRR, s1 s2 normal; no ectopy.  1/6 systolic murmur.  No rubs thrills or heaves  Abdomen: soft, nontender; no hepatosplenomehaly, BS+; abdominal aorta nontender and not dilated by palpation. Back: No CVA tenderness Pulses 2+ Extremities: no clubbinbg cyanosis or edema, Homan's sign negative  Neurologic: grossly nonfocal; cranial nerves intact. Psychological: Normal affect and mood.   LABS:  BMET  BMP Latest Ref Rng 01/30/2014 01/25/2014 01/17/2014  Glucose 70 - 99 mg/dL 103(H) 105 106(H)  BUN 6 - 23 mg/dL 12 9.9 12  Creatinine 0.50 - 1.10 mg/dL 0.82 1.0 0.93  Sodium 135 - 145 mmol/L 139 140 138  Potassium 3.5 - 5.1 mmol/L 3.4(L) 3.3(L) 3.8  Chloride 96 - 112 mEq/L 106 - 98  CO2 19 - 32 mmol/L 30 28 30   Calcium 8.4 - 10.5 mg/dL 9.4 9.4 9.9     Hepatic Function Panel   Hepatic Function Latest Ref Rng 01/25/2014 09/28/2013 05/20/2013  Total Protein 6.4 - 8.3 g/dL 7.5 7.9 8.2  Albumin 3.5 - 5.0 g/dL 3.5 3.7 3.8  AST 5 - 34 U/L 18 17 20   ALT 0 - 55 U/L 27 18 28   Alk Phosphatase 40 - 150 U/L 78 77 84  Total Bilirubin 0.20 - 1.20 mg/dL 0.50 0.66 0.56     CBC  CBC Latest Ref Rng 01/30/2014 01/25/2014 09/28/2013  WBC 4.0 - 10.5 K/uL 5.4 5.1 5.6  Hemoglobin 12.0 - 15.0 g/dL 12.7 12.8 12.4    Hematocrit 36.0 - 46.0 % 38.1 37.9 38.0  Platelets 150 - 400 K/uL 201 194 218     BNP    Component Value Date/Time   PROBNP 170.7* 07/07/2013 1912    Lipid Panel  No results found for: CHOL   RADIOLOGY: No results found.    ASSESSMENT AND PLAN: Ms. Reece Mcbroom is a 74 year old female who has a history of lower extremity edema, asthma, seasonal allergies, and chronic left bundle branch block.  She was previously diagnosed with mild obstructive sleep apnea in 2013,  but moderately severe during REM sleep.  She could not tolerate higher pressure CPAP, but with BiPAP at a pressure of 14/10,  She did remarkably well.  When I saw her, she admitted to having similar symptoms that she had had previously and had not been using CPAP or BiPAP in well over a year.  Her most recent polysomnogram, however, did not reveal obstructive sleep apnea but only mild increased upper airway resistance.  With her significant Epworth sleepiness scale score she may require further evaluation with multiple latency sleep test to evaluate for narcolepsy or idiopathic hypersomnolence.  However, prior to scheduling her for this, I am concerned that sleep maintenance may be a big issue.  She is having inadequate sleep time and by history appears that she may only be sleeping for 4-5 hours per night at most.  As result, she has having to take several naps during the day.  I will try to give her a prescription for Lunesta 2 mg and if the insurance company does not allow for this we will then try zolpidem extended release 6.25 mg daily.  She should not use a short-acting hypnotic since herprimary problem appears to be sleep maintenance not sleep initiation.  I discussed with her data concerning increased mortality with sleep deprivation.  I also discussed reduced cognitive function and memory.  If she continues to have significant difficulty with hypersomnolence she will be referred for an MS LT evaluation.  I will see her  back in several months for reassessment.    Troy Sine, MD, [facility]  04/16/2014 12:09 PM

## 2014-04-18 NOTE — Telephone Encounter (Signed)
Approval for eszopicline 2 mg received via fax

## 2014-04-26 ENCOUNTER — Other Ambulatory Visit (HOSPITAL_BASED_OUTPATIENT_CLINIC_OR_DEPARTMENT_OTHER): Payer: Medicare HMO

## 2014-04-26 DIAGNOSIS — D472 Monoclonal gammopathy: Secondary | ICD-10-CM

## 2014-04-26 DIAGNOSIS — E039 Hypothyroidism, unspecified: Secondary | ICD-10-CM

## 2014-04-26 LAB — COMPREHENSIVE METABOLIC PANEL (CC13)
ALT: 21 U/L (ref 0–55)
AST: 17 U/L (ref 5–34)
Albumin: 3.3 g/dL — ABNORMAL LOW (ref 3.5–5.0)
Alkaline Phosphatase: 77 U/L (ref 40–150)
Anion Gap: 11 mEq/L (ref 3–11)
BUN: 6.1 mg/dL — ABNORMAL LOW (ref 7.0–26.0)
CO2: 24 mEq/L (ref 22–29)
Calcium: 9.1 mg/dL (ref 8.4–10.4)
Chloride: 109 mEq/L (ref 98–109)
Creatinine: 0.8 mg/dL (ref 0.6–1.1)
EGFR: 89 mL/min/{1.73_m2} — ABNORMAL LOW (ref >90)
Glucose: 118 mg/dl (ref 70–140)
Potassium: 3.4 mEq/L — ABNORMAL LOW (ref 3.5–5.1)
Sodium: 143 mEq/L (ref 136–145)
Total Bilirubin: 0.54 mg/dL (ref 0.20–1.20)
Total Protein: 7 g/dL (ref 6.4–8.3)

## 2014-04-26 LAB — CBC WITH DIFFERENTIAL/PLATELET
BASO%: 0.8 % (ref 0.0–2.0)
Basophils Absolute: 0 10*3/uL (ref 0.0–0.1)
EOS%: 4.9 % (ref 0.0–7.0)
Eosinophils Absolute: 0.2 10*3/uL (ref 0.0–0.5)
HCT: 36.1 % (ref 34.8–46.6)
HGB: 12 g/dL (ref 11.6–15.9)
LYMPH%: 37.1 % (ref 14.0–49.7)
MCH: 27.3 pg (ref 25.1–34.0)
MCHC: 33.2 g/dL (ref 31.5–36.0)
MCV: 82.2 fL (ref 79.5–101.0)
MONO#: 0.4 10*3/uL (ref 0.1–0.9)
MONO%: 9.8 % (ref 0.0–14.0)
NEUT#: 1.7 10*3/uL (ref 1.5–6.5)
NEUT%: 47.4 % (ref 38.4–76.8)
Platelets: 191 10*3/uL (ref 145–400)
RBC: 4.39 10*6/uL (ref 3.70–5.45)
RDW: 14.4 % (ref 11.2–14.5)
WBC: 3.7 10*3/uL — ABNORMAL LOW (ref 3.9–10.3)
lymph#: 1.4 10*3/uL (ref 0.9–3.3)

## 2014-04-26 LAB — LACTATE DEHYDROGENASE (CC13): LDH: 221 U/L (ref 125–245)

## 2014-04-28 LAB — SPEP & IFE WITH QIG
Abnormal Protein Band1: 1 g/dL
Albumin ELP: 3.6 g/dL — ABNORMAL LOW (ref 3.8–4.8)
Alpha-1-Globulin: 0.3 g/dL (ref 0.2–0.3)
Alpha-2-Globulin: 0.7 g/dL (ref 0.5–0.9)
Beta 2: 0.3 g/dL (ref 0.2–0.5)
Beta Globulin: 0.3 g/dL — ABNORMAL LOW (ref 0.4–0.6)
Gamma Globulin: 1.4 g/dL (ref 0.8–1.7)
IgA: 72 mg/dL (ref 69–380)
IgG (Immunoglobin G), Serum: 1870 mg/dL — ABNORMAL HIGH (ref 690–1700)
IgM, Serum: 27 mg/dL — ABNORMAL LOW (ref 52–322)
Total Protein, Serum Electrophoresis: 6.6 g/dL (ref 6.1–8.1)

## 2014-04-28 LAB — KAPPA/LAMBDA LIGHT CHAINS
Kappa free light chain: 7.77 mg/dL — ABNORMAL HIGH (ref 0.33–1.94)
Kappa:Lambda Ratio: 8.83 — ABNORMAL HIGH (ref 0.26–1.65)
Lambda Free Lght Chn: 0.88 mg/dL (ref 0.57–2.63)

## 2014-04-28 LAB — BETA 2 MICROGLOBULIN, SERUM: Beta-2 Microglobulin: 1.87 mg/L (ref <=2.51)

## 2014-05-03 ENCOUNTER — Encounter: Payer: Self-pay | Admitting: Hematology and Oncology

## 2014-05-03 ENCOUNTER — Ambulatory Visit (HOSPITAL_BASED_OUTPATIENT_CLINIC_OR_DEPARTMENT_OTHER): Payer: Medicare HMO | Admitting: Hematology and Oncology

## 2014-05-03 ENCOUNTER — Telehealth: Payer: Self-pay | Admitting: Hematology and Oncology

## 2014-05-03 VITALS — BP 179/65 | HR 71 | Temp 98.0°F | Resp 18 | Ht 67.0 in | Wt 194.7 lb

## 2014-05-03 DIAGNOSIS — I10 Essential (primary) hypertension: Secondary | ICD-10-CM

## 2014-05-03 DIAGNOSIS — D472 Monoclonal gammopathy: Secondary | ICD-10-CM | POA: Diagnosis not present

## 2014-05-03 NOTE — Telephone Encounter (Signed)
Pt confirmed labs/ov per 03/29 POF, gave pt AVS and Calendar.... KJ °

## 2014-05-03 NOTE — Assessment & Plan Note (Signed)
Her blood pressure is high but the patient claimed this is whitecoat hypertension. I will defer to her primary care physician to manage this.

## 2014-05-03 NOTE — Assessment & Plan Note (Signed)
Clinically, she has no signs of disease progression. Her M spike is actually improving.  I will see her back in 12 months with repeat blood work and imaging study to be done the week ahead of time so that we can have results before see her back.

## 2014-05-03 NOTE — Progress Notes (Signed)
[place] OFFICE PROGRESS NOTE  Patient Care Team: Saralyn Pilar as PCP - General (Family Medicine) Bo Merino, MD as Referring Physician (Rheumatology)  SUMMARY OF ONCOLOGIC HISTORY: I reviewed the patient's records extensive and collaborated the history with the patient. Summary of her history is as follows: She has a long-standing history of multiple arthralgias, left CMC pain, OA of the bilateral knees, hands and feet and osteoporosis for which she is on Prolia previously. She had SPEP drawn on 02/19/2013 that revealed a restricted band with monoclonal protein present, IgG kappa subtype. The monoclonal protein peak accounted for 1.38 g/dL of the total 1.66 g/dL of protein in the gamma region. Her CMP demonstrated a creatinine of 0.81 and calcium of 9.0 and total protein of 7.2 and albumin of 3.9; Her CBC was within normal limits with a hemoglobin of 12.5. Skeletal x-ray show no evidence of lytic lesions. She had a bone marrow biopsy on 06/22/2013 which show 10% of plasma cells. She was observed.  INTERVAL HISTORY: Please see below for problem oriented charting. She feels well. Denies recent bone pain or bone fracture. She have chronic arthritis, unchanged. Denies recent infection.  REVIEW OF SYSTEMS:   Constitutional: Denies fevers, chills or abnormal weight loss Eyes: Denies blurriness of vision Ears, nose, mouth, throat, and face: Denies mucositis or sore throat Respiratory: Denies cough, dyspnea or wheezes Cardiovascular: Denies palpitation, chest discomfort or lower extremity swelling Gastrointestinal:  Denies nausea, heartburn or change in bowel habits Skin: Denies abnormal skin rashes Lymphatics: Denies new lymphadenopathy or easy bruising Neurological:Denies numbness, tingling or new weaknesses Behavioral/Psych: Mood is stable, no new changes  All other systems were reviewed with the patient and are negative.  I have reviewed the past medical history,  past surgical history, social history and family history with the patient and they are unchanged from previous note.  ALLERGIES:  is allergic to bee venom and penicillins.  MEDICATIONS:  Current Outpatient Prescriptions  Medication Sig Dispense Refill  . acetaminophen (TYLENOL) 500 MG tablet Take 500 mg by mouth every 6 (six) hours as needed for mild pain.    Marland Kitchen albuterol (PROVENTIL HFA;VENTOLIN HFA) 108 (90 BASE) MCG/ACT inhaler Inhale 2 puffs into the lungs every 4 (four) hours as needed for shortness of breath. For shortness of breath and wheezing    . beclomethasone (QVAR) 80 MCG/ACT inhaler Inhale 2 puffs into the lungs 2 (two) times daily.    . cetirizine (ZYRTEC) 10 MG tablet Take 10 mg by mouth daily as needed for allergies.    . cycloSPORINE (RESTASIS) 0.05 % ophthalmic emulsion Place 1 drop into both eyes 2 (two) times daily.     . diclofenac sodium (VOLTAREN) 1 % GEL Apply 2 g topically daily as needed (Knee pain).     Marland Kitchen EPINEPHrine 0.3 mg/0.3 mL IJ SOAJ injection Inject 0.3 mg into the muscle daily as needed (bee stings).     Marland Kitchen esomeprazole (NEXIUM) 40 MG capsule Take 40 mg by mouth 2 (two) times daily.     . eszopiclone (LUNESTA) 2 MG TABS tablet Take 1 tablet (2 mg total) by mouth at bedtime as needed for sleep. Take immediately before bedtime 30 tablet 1  . fluticasone (FLONASE) 50 MCG/ACT nasal spray Place 2 sprays into both nostrils as needed for allergies.     . furosemide (LASIX) 20 MG tablet Take 2 tablets (40 mg total) by mouth daily. 60 tablet 6  . metoprolol succinate (TOPROL XL) 25 MG 24 hr tablet  Take 0.5 tablets (12.5 mg total) by mouth daily. (Patient taking differently: Take 25 mg by mouth daily. ) 15 tablet 6  . montelukast (SINGULAIR) 10 MG tablet Take 10 mg by mouth at bedtime.    . Olopatadine HCl (PATADAY) 0.2 % SOLN Apply 1 drop to eye daily as needed (Allergies).     . rosuvastatin (CRESTOR) 40 MG tablet Take 1 tablet (40 mg total) by mouth daily. 30 tablet 6   . traMADol (ULTRAM) 50 MG tablet Take 50 mg by mouth as needed for moderate pain.   0  . valsartan-hydrochlorothiazide (DIOVAN-HCT) 160-12.5 MG per tablet Take 1 tablet by mouth daily.     No current facility-administered medications for this visit.    PHYSICAL EXAMINATION: ECOG PERFORMANCE STATUS: 0 - Asymptomatic  Filed Vitals:   05/03/14 1124  BP: 179/65  Pulse: 71  Temp: 98 F (36.7 C)  Resp: 18   Filed Weights   05/03/14 1124  Weight: 194 lb 11.2 oz (88.315 kg)    GENERAL:alert, no distress and comfortable SKIN: skin color, texture, turgor are normal, no rashes or significant lesions EYES: normal, Conjunctiva are pink and non-injected, sclera clear Musculoskeletal:no cyanosis of digits and no clubbing  NEURO: alert & oriented x 3 with fluent speech, no focal motor/sensory deficits  LABORATORY DATA:  I have reviewed the data as listed    Component Value Date/Time   NA 143 04/26/2014 0950   NA 139 01/30/2014 1752   K 3.4* 04/26/2014 0950   K 3.4* 01/30/2014 1752   CL 106 01/30/2014 1752   CO2 24 04/26/2014 0950   CO2 30 01/30/2014 1752   GLUCOSE 118 04/26/2014 0950   GLUCOSE 103* 01/30/2014 1752   BUN 6.1* 04/26/2014 0950   BUN 12 01/30/2014 1752   CREATININE 0.8 04/26/2014 0950   CREATININE 0.82 01/30/2014 1752   CREATININE 0.93 01/17/2014 1400   CALCIUM 9.1 04/26/2014 0950   CALCIUM 9.4 01/30/2014 1752   PROT 7.0 04/26/2014 0950   PROT 9.2* 03/31/2010 0730   ALBUMIN 3.3* 04/26/2014 0950   ALBUMIN 4.5 03/31/2010 0730   AST 17 04/26/2014 0950   AST 27 03/31/2010 0730   ALT 21 04/26/2014 0950   ALT 38* 03/31/2010 0730   ALKPHOS 77 04/26/2014 0950   ALKPHOS 101 03/31/2010 0730   BILITOT 0.54 04/26/2014 0950   BILITOT 0.8 03/31/2010 0730   GFRNONAA 70* 01/30/2014 1752   GFRAA 81* 01/30/2014 1752    No results found for: SPEP, UPEP  Lab Results  Component Value Date   WBC 3.7* 04/26/2014   NEUTROABS 1.7 04/26/2014   HGB 12.0 04/26/2014   HCT  36.1 04/26/2014   MCV 82.2 04/26/2014   PLT 191 04/26/2014      Chemistry      Component Value Date/Time   NA 143 04/26/2014 0950   NA 139 01/30/2014 1752   K 3.4* 04/26/2014 0950   K 3.4* 01/30/2014 1752   CL 106 01/30/2014 1752   CO2 24 04/26/2014 0950   CO2 30 01/30/2014 1752   BUN 6.1* 04/26/2014 0950   BUN 12 01/30/2014 1752   CREATININE 0.8 04/26/2014 0950   CREATININE 0.82 01/30/2014 1752   CREATININE 0.93 01/17/2014 1400      Component Value Date/Time   CALCIUM 9.1 04/26/2014 0950   CALCIUM 9.4 01/30/2014 1752   ALKPHOS 77 04/26/2014 0950   ALKPHOS 101 03/31/2010 0730   AST 17 04/26/2014 0950   AST 27 03/31/2010 0730   ALT 21  04/26/2014 0950   ALT 38* 03/31/2010 0730   BILITOT 0.54 04/26/2014 0950   BILITOT 0.8 03/31/2010 0730      ASSESSMENT & PLAN:  MGUS (monoclonal gammopathy of unknown significance) Clinically, she has no signs of disease progression. Her M spike is actually improving.  I will see her back in 12 months with repeat blood work and imaging study to be done the week ahead of time so that we can have results before see her back.     Essential hypertension Her blood pressure is high but the patient claimed this is whitecoat hypertension. I will defer to her primary care physician to manage this.    Orders Placed This Encounter  Procedures  . DG Bone Survey Met    Standing Status: Future     Number of Occurrences:      Standing Expiration Date: 07/03/2015    Order Specific Question:  Reason for Exam (SYMPTOM  OR DIAGNOSIS REQUIRED)    Answer:  staging myeloma    Order Specific Question:  Preferred imaging location?    Answer:  [facility]  . CBC with Differential/Platelet    Standing Status: Future     Number of Occurrences:      Standing Expiration Date: 07/03/2015  . Comprehensive metabolic panel    Standing Status: Future     Number of Occurrences:      Standing Expiration Date: 07/03/2015  . Lactate dehydrogenase     Standing Status: Future     Number of Occurrences:      Standing Expiration Date: 07/03/2015  . SPEP & IFE with QIG    Standing Status: Future     Number of Occurrences:      Standing Expiration Date: 07/03/2015  . Kappa/lambda light chains    Standing Status: Future     Number of Occurrences:      Standing Expiration Date: 07/03/2015  . Beta 2 microglobulin, serum    Standing Status: Future     Number of Occurrences:      Standing Expiration Date: 07/03/2015   All questions were answered. The patient knows to call the clinic with any problems, questions or concerns. No barriers to learning was detected. I spent 15 minutes counseling the patient face to face. The total time spent in the appointment was 20 minutes and more than 50% was on counseling and review of test results     [facility], [place], MD 05/03/2014 1:21 PM

## 2014-06-21 ENCOUNTER — Encounter: Payer: Self-pay | Admitting: Physical Medicine & Rehabilitation

## 2014-06-29 ENCOUNTER — Encounter: Payer: Medicare HMO | Attending: Family Medicine

## 2014-06-29 ENCOUNTER — Emergency Department (HOSPITAL_COMMUNITY)
Admission: EM | Admit: 2014-06-29 | Discharge: 2014-06-30 | Disposition: A | Discharge status: Home / Self Care | Payer: Medicare HMO | Attending: Emergency Medicine | Admitting: Emergency Medicine

## 2014-06-29 ENCOUNTER — Encounter (HOSPITAL_COMMUNITY): Payer: Self-pay | Admitting: Emergency Medicine

## 2014-06-29 DIAGNOSIS — Z79899 Other long term (current) drug therapy: Secondary | ICD-10-CM | POA: Insufficient documentation

## 2014-06-29 DIAGNOSIS — E119 Type 2 diabetes mellitus without complications: Secondary | ICD-10-CM | POA: Insufficient documentation

## 2014-06-29 DIAGNOSIS — R0982 Postnasal drip: Secondary | ICD-10-CM | POA: Insufficient documentation

## 2014-06-29 DIAGNOSIS — R05 Cough: Secondary | ICD-10-CM | POA: Diagnosis not present

## 2014-06-29 DIAGNOSIS — Z87891 Personal history of nicotine dependence: Secondary | ICD-10-CM | POA: Diagnosis not present

## 2014-06-29 DIAGNOSIS — G4733 Obstructive sleep apnea (adult) (pediatric): Secondary | ICD-10-CM | POA: Insufficient documentation

## 2014-06-29 DIAGNOSIS — R519 Headache, unspecified: Secondary | ICD-10-CM

## 2014-06-29 DIAGNOSIS — E041 Nontoxic single thyroid nodule: Secondary | ICD-10-CM | POA: Diagnosis not present

## 2014-06-29 DIAGNOSIS — Z9981 Dependence on supplemental oxygen: Secondary | ICD-10-CM | POA: Insufficient documentation

## 2014-06-29 DIAGNOSIS — E785 Hyperlipidemia, unspecified: Secondary | ICD-10-CM | POA: Insufficient documentation

## 2014-06-29 DIAGNOSIS — I10 Essential (primary) hypertension: Secondary | ICD-10-CM | POA: Diagnosis not present

## 2014-06-29 DIAGNOSIS — Z7951 Long term (current) use of inhaled steroids: Secondary | ICD-10-CM | POA: Diagnosis not present

## 2014-06-29 DIAGNOSIS — R51 Headache: Secondary | ICD-10-CM | POA: Insufficient documentation

## 2014-06-29 DIAGNOSIS — J45909 Unspecified asthma, uncomplicated: Secondary | ICD-10-CM | POA: Insufficient documentation

## 2014-06-29 DIAGNOSIS — Z862 Personal history of diseases of the blood and blood-forming organs and certain disorders involving the immune mechanism: Secondary | ICD-10-CM | POA: Insufficient documentation

## 2014-06-29 DIAGNOSIS — J3489 Other specified disorders of nose and nasal sinuses: Secondary | ICD-10-CM | POA: Diagnosis not present

## 2014-06-29 DIAGNOSIS — R0981 Nasal congestion: Secondary | ICD-10-CM | POA: Insufficient documentation

## 2014-06-29 DIAGNOSIS — Z880 Allergy status to penicillin: Secondary | ICD-10-CM | POA: Insufficient documentation

## 2014-06-29 DIAGNOSIS — Z713 Dietary counseling and surveillance: Secondary | ICD-10-CM | POA: Diagnosis not present

## 2014-06-29 LAB — CBC WITH DIFFERENTIAL/PLATELET
Basophils Absolute: 0 10*3/uL (ref 0.0–0.1)
Basophils Relative: 0 % (ref 0–1)
Eosinophils Absolute: 0 10*3/uL (ref 0.0–0.7)
Eosinophils Relative: 0 % (ref 0–5)
HCT: 35.1 % — ABNORMAL LOW (ref 36.0–46.0)
Hemoglobin: 11.9 g/dL — ABNORMAL LOW (ref 12.0–15.0)
Lymphocytes Relative: 31 % (ref 12–46)
Lymphs Abs: 2.3 10*3/uL (ref 0.7–4.0)
MCH: 27.4 pg (ref 26.0–34.0)
MCHC: 33.9 g/dL (ref 30.0–36.0)
MCV: 80.9 fL (ref 78.0–100.0)
Monocytes Absolute: 0.7 10*3/uL (ref 0.1–1.0)
Monocytes Relative: 10 % (ref 3–12)
Neutro Abs: 4.3 10*3/uL (ref 1.7–7.7)
Neutrophils Relative %: 59 % (ref 43–77)
Platelets: 223 10*3/uL (ref 150–400)
RBC: 4.34 MIL/uL (ref 3.87–5.11)
RDW: 14.6 % (ref 11.5–15.5)
WBC: 7.3 10*3/uL (ref 4.0–10.5)

## 2014-06-29 LAB — COMPREHENSIVE METABOLIC PANEL
ALT: 18 U/L (ref 14–54)
AST: 16 U/L (ref 15–41)
Albumin: 3.6 g/dL (ref 3.5–5.0)
Alkaline Phosphatase: 87 U/L (ref 38–126)
Anion gap: 9 (ref 5–15)
BUN: 5 mg/dL — ABNORMAL LOW (ref 6–20)
CO2: 27 mmol/L (ref 22–32)
Calcium: 9.5 mg/dL (ref 8.9–10.3)
Chloride: 105 mmol/L (ref 101–111)
Creatinine, Ser: 0.87 mg/dL (ref 0.44–1.00)
GFR calc Af Amer: >60 mL/min (ref >60)
GFR calc non Af Amer: >60 mL/min (ref >60)
Glucose, Bld: 103 mg/dL — ABNORMAL HIGH (ref 65–99)
Potassium: 3 mmol/L — ABNORMAL LOW (ref 3.5–5.1)
Sodium: 141 mmol/L (ref 135–145)
Total Bilirubin: 0.5 mg/dL (ref 0.3–1.2)
Total Protein: 7.6 g/dL (ref 6.5–8.1)

## 2014-06-29 LAB — I-STAT TROPONIN, ED: Troponin i, poc: 0 ng/mL (ref 0.00–0.08)

## 2014-06-29 MED ORDER — IBUPROFEN 400 MG PO TABS
600.0000 mg | ORAL_TABLET | Freq: Once | ORAL | Status: AC
  Administered 2014-06-29: 600 mg via ORAL
  Filled 2014-06-29 (×2): qty 1

## 2014-06-29 NOTE — ED Provider Notes (Signed)
CSN: [id number]     Arrival date & time 06/29/14  1827 History   First MD Initiated Contact with Patient 06/29/14 2300     Chief Complaint  Patient presents with  . Headache     (Consider location/radiation/quality/duration/timing/severity/associated sxs/prior Treatment) Patient is a 74 y.o. female presenting with headaches. The history is provided by the patient. No language interpreter was used.  Headache Location: L maxillary. Radiates to: Left jaw, left neck. Duration:  4 days Timing:  Intermittent Progression:  Waxing and waning Chronicity:  New Similar to prior headaches: no   Context: coughing   Context comment:  Congestion, rhinorrhea, postnasal drip Relieved by:  Nothing Worsened by:  Nothing Ineffective treatments:  None tried Associated symptoms: congestion, cough and facial pain   Associated symptoms: no abdominal pain, no back pain, no diarrhea, no fatigue, no fever, no focal weakness, no hearing loss, no loss of balance, no nausea, no neck pain, no neck stiffness, no numbness, no photophobia, no sore throat, no visual change, no vomiting and no weakness   Congestion:    Location:  Nasal   Interferes with sleep: no     Interferes with eating/drinking: no     Past Medical History  Diagnosis Date  . Asthma   . Hypertension   . OSA on CPAP   . Dyslipidemia   . Hypothyroidism   . LBBB (left bundle branch block)   . MGUS (monoclonal gammopathy of unknown significance)   . Diabetes mellitus without complication    Past Surgical History  Procedure Laterality Date  . Cholecystectomy  1976  . Tonsillectomy  1965  . Abdominal hysterectomy  1969  . Plantar fascia release    . US echocardiography  09/20/2008    borderline LVH,mild TR,AOV mildly sclerotic w/ca+ of the leaflets  . Nm myoview ltd  02/13/2010    No ischemia   Family History  Problem Relation Age of Onset  . Diabetes Father   . Heart failure Father    History  Substance Use Topics  . Smoking  status: Former Smoker    Quit date: 02/04/2006  . Smokeless tobacco: Never Used  . Alcohol Use: No   OB History    No data available     Review of Systems  Constitutional: Negative for fever, chills, diaphoresis, activity change, appetite change and fatigue.  HENT: Positive for congestion. Negative for facial swelling, hearing loss, rhinorrhea and sore throat.   Eyes: Negative for photophobia and discharge.  Respiratory: Positive for cough. Negative for chest tightness and shortness of breath.   Cardiovascular: Negative for chest pain, palpitations and leg swelling.  Gastrointestinal: Negative for nausea, vomiting, abdominal pain and diarrhea.  Endocrine: Negative for polydipsia and polyuria.  Genitourinary: Negative for dysuria, frequency, difficulty urinating and pelvic pain.  Musculoskeletal: Negative for back pain, arthralgias, neck pain and neck stiffness.  Skin: Negative for color change and wound.  Allergic/Immunologic: Negative for immunocompromised state.  Neurological: Positive for headaches. Negative for focal weakness, facial asymmetry, weakness, numbness and loss of balance.  Hematological: Does not bruise/bleed easily.  Psychiatric/Behavioral: Negative for confusion and agitation.      Allergies  Bee venom and Penicillins  Home Medications   Prior to Admission medications   Medication Sig Start Date End Date Taking? Authorizing Provider  acetaminophen (TYLENOL) 500 MG tablet Take 500 mg by mouth every 6 (six) hours as needed for mild pain.   Yes Historical Provider, MD  albuterol (PROVENTIL HFA;VENTOLIN HFA) 108 (90 BASE) MCG/ACT inhaler  Inhale 2 puffs into the lungs every 4 (four) hours as needed for shortness of breath. For shortness of breath and wheezing   Yes Historical Provider, MD  beclomethasone (QVAR) 80 MCG/ACT inhaler Inhale 2 puffs into the lungs 2 (two) times daily.   Yes Historical Provider, MD  cetirizine (ZYRTEC) 10 MG tablet Take 10 mg by mouth  daily.    Yes Historical Provider, MD  cycloSPORINE (RESTASIS) 0.05 % ophthalmic emulsion Place 1 drop into both eyes at bedtime.    Yes Historical Provider, MD  diclofenac sodium (VOLTAREN) 1 % GEL Apply 2 g topically daily as needed (Knee pain).    Yes Historical Provider, MD  EPINEPHrine 0.3 mg/0.3 mL IJ SOAJ injection Inject 0.3 mg into the muscle daily as needed (bee stings).    Yes Historical Provider, MD  esomeprazole (NEXIUM) 40 MG capsule Take 40 mg by mouth 2 (two) times daily.    Yes Historical Provider, MD  eszopiclone (LUNESTA) 2 MG TABS tablet Take 1 tablet (2 mg total) by mouth at bedtime as needed for sleep. Take immediately before bedtime 04/15/14  Yes Troy Sine, MD  fluticasone [facility]) 50 MCG/ACT nasal spray Place 2 sprays into both nostrils as needed for allergies.  12/27/13  Yes Historical Provider, MD  furosemide (LASIX) 20 MG tablet Take 2 tablets (40 mg total) by mouth daily. 01/13/14  Yes Pixie Casino, MD  metFORMIN (GLUCOPHAGE) 500 MG tablet Take 500 mg by mouth daily.   Yes Historical Provider, MD  metoprolol succinate (TOPROL XL) 25 MG 24 hr tablet Take 0.5 tablets (12.5 mg total) by mouth daily. Patient taking differently: Take 25 mg by mouth daily.  07/29/13  Yes Pixie Casino, MD  montelukast (SINGULAIR) 10 MG tablet Take 10 mg by mouth at bedtime.   Yes Historical Provider, MD  Olopatadine HCl (PATADAY) 0.2 % SOLN Apply 1 drop to eye daily as needed (Allergies).    Yes Historical Provider, MD  rosuvastatin (CRESTOR) 40 MG tablet Take 1 tablet (40 mg total) by mouth daily. 01/13/14  Yes Pixie Casino, MD  traMADol (ULTRAM) 50 MG tablet Take 50 mg by mouth every 6 (six) hours as needed for moderate pain.  12/23/13  Yes Historical Provider, MD  valsartan-hydrochlorothiazide (DIOVAN-HCT) 160-12.5 MG per tablet Take 1 tablet by mouth daily.   Yes Historical Provider, MD   BP 157/65 mmHg  Pulse 58  Temp(Src) 98 F (36.7 C) (Oral)  Resp 12  Ht 5\' 6"  (1.676 m)   Wt 189 lb (85.73 kg)  BMI 30.52 kg/m2  SpO2 99% Physical Exam  Constitutional: She is oriented to person, place, and time. She appears well-developed and well-nourished. No distress.  HENT:  Head: Normocephalic and atraumatic.    Mouth/Throat: No oropharyngeal exudate.  Eyes: Pupils are equal, round, and reactive to light.  Neck: Normal range of motion. Neck supple.  Cardiovascular: Normal rate, regular rhythm and normal heart sounds.  Exam reveals no gallop and no friction rub.   No murmur heard. Pulmonary/Chest: Effort normal and breath sounds normal. No respiratory distress. She has no wheezes. She has no rales.  Abdominal: Soft. Bowel sounds are normal. She exhibits no distension and no mass. There is no tenderness. There is no rebound and no guarding.  Musculoskeletal: Normal range of motion. She exhibits no edema or tenderness.  Neurological: She is alert and oriented to person, place, and time. She has normal strength. She displays no tremor. No cranial nerve deficit or sensory deficit.  She exhibits normal muscle tone. She displays a negative Romberg sign. Coordination and gait normal. GCS eye subscore is 4. GCS verbal subscore is 5. GCS motor subscore is 6.  Skin: Skin is warm and dry.  Psychiatric: She has a normal mood and affect.    ED Course  Procedures (including critical care time) Labs Review Labs Reviewed  CBC WITH DIFFERENTIAL/PLATELET - Abnormal; Notable for the following:    Hemoglobin 11.9 (*)    HCT 35.1 (*)    All other components within normal limits  COMPREHENSIVE METABOLIC PANEL - Abnormal; Notable for the following:    Potassium 3.0 (*)    Glucose, Bld 103 (*)    BUN 5 (*)    All other components within normal limits  I-STAT TROPOININ, ED    Imaging Review No results found.   EKG Interpretation   Date/Time:  Wednesday Jun 29 2014 23:47:57 EDT Ventricular Rate:  53 PR Interval:  198 QRS Duration: 151 QT Interval:  525 QTC Calculation: 493 R  Axis:   -73 Text Interpretation:  Sinus rhythm Left bundle branch block No significant  change since last tracing Confirmed by DOCHERTY  MD, MEGAN ([id number]) on  06/29/2014 11:55:32 PM      MDM   Final diagnoses:  Left facial pain  Thyroid nodule    Pt is a 73 y.o. female with Pmhx as above who presents with L facial pain with raidation to L neck, L jaw for 4 days.  She is also had cough, congestion, postnasal drip, butfevers, chills, numbness or weakness.  On physical exam she has tenderness over her left maxillary and frontal sinus, left temple, left TMJ, left sternocleidomastoid.  No focal neuro findings.  Do not feel that this is likely consistent with ACS, however, I have ordered a screening EKG unchanged and troponin nml..  Given age and no prior history of headaches, CT head and face ordered and were pedning at time of transfer to Dr. Dina Rich Patient given ibuprofen in the ED with improvement of symptoms.  Suspect acute sinusitis.  Exam is not consistent with CVA, TIA, temporal arteritis.   CT head, face w/o acute findings. I believe pt safe to d/c home and f/u closely with PCP, rec OTC decongestants, NSAIDs, tylenol.   [name] evaluation in the Emergency Department is complete. It has been determined that no acute conditions requiring further emergency intervention are present at this time. The patient/guardian have been advised of the diagnosis and plan. We have discussed signs and symptoms that warrant return to the ED, such as changes or worsening in symptoms, worsening pain, fever, numbness, weakness      Ernestina Patches, MD 07/02/14 [phone]

## 2014-06-29 NOTE — ED Notes (Signed)
Pt. reports headache radiating to left face and back of neck onset 4 days ago , denies head injury , no fever or chills. Denies nausea or blurred vision .

## 2014-06-30 ENCOUNTER — Emergency Department (HOSPITAL_COMMUNITY): Payer: Medicare HMO

## 2014-06-30 ENCOUNTER — Encounter (HOSPITAL_COMMUNITY): Payer: Self-pay

## 2014-06-30 DIAGNOSIS — R51 Headache: Secondary | ICD-10-CM | POA: Diagnosis not present

## 2014-06-30 MED ORDER — POTASSIUM CHLORIDE CRYS ER 20 MEQ PO TBCR
40.0000 meq | EXTENDED_RELEASE_TABLET | Freq: Once | ORAL | Status: AC
  Administered 2014-06-30: 40 meq via ORAL
  Filled 2014-06-30: qty 2

## 2014-06-30 NOTE — Progress Notes (Signed)
Appt start time: 1730 end time:  1820.  Patient was seen on 06/29/2014 for a review of the series of three diabetes self-management courses at the [facility]. The following learning objectives were met by the patient during this class:  . Reviewed blood glucose monitoring and interpretation including the recommended target ranges and Hgb A1c.  . Reviewed on carb counting, importance of regularly scheduled meals/snacks, and meal planning.  . Reviewed the effects of physical activity on glucose levels and long-term glucose control.  Recommended goal of 150 minutes of physical activity/week. . Reviewed patient medications and discussed role of medication on blood glucose and possible side effects. . Discussed strategies to manage stress, psychosocial issues, and other obstacles to diabetes management. . Encouraged moderate weight reduction to improve glucose levels.   . Reviewed short-term complications: hyper- and hypo-glycemia.  Discussed causes, symptoms, and treatment options. . Reviewed prevention, detection, and treatment of long-term complications.  Discussed the role of prolonged elevated glucose levels on body systems.  Goals:  Follow Diabetes Meal Plan as instructed  Eat 3 meals and 2 snacks, every 3-5 hrs  Limit carbohydrate intake to 45 grams carbohydrate/meal Limit carbohydrate intake to 15 grams carbohydrate/snack Add lean protein foods to meals/snacks  Monitor glucose levels as instructed by your doctor  Aim for goal of 15-30 mins of physical activity daily as tolerated  Bring food record and glucose log to your next nutrition visit

## 2014-06-30 NOTE — Discharge Instructions (Signed)

## 2014-07-01 DIAGNOSIS — E041 Nontoxic single thyroid nodule: Secondary | ICD-10-CM | POA: Insufficient documentation

## 2014-07-29 ENCOUNTER — Ambulatory Visit (HOSPITAL_BASED_OUTPATIENT_CLINIC_OR_DEPARTMENT_OTHER): Payer: Medicare HMO | Admitting: Physical Medicine & Rehabilitation

## 2014-07-29 ENCOUNTER — Encounter: Payer: Self-pay | Admitting: Physical Medicine & Rehabilitation

## 2014-07-29 ENCOUNTER — Other Ambulatory Visit: Payer: Self-pay | Admitting: Physical Medicine & Rehabilitation

## 2014-07-29 ENCOUNTER — Encounter: Payer: Medicare HMO | Attending: Physical Medicine & Rehabilitation

## 2014-07-29 VITALS — BP 145/77 | HR 96 | Resp 14

## 2014-07-29 DIAGNOSIS — Z5181 Encounter for therapeutic drug level monitoring: Secondary | ICD-10-CM | POA: Diagnosis not present

## 2014-07-29 DIAGNOSIS — Z79899 Other long term (current) drug therapy: Secondary | ICD-10-CM | POA: Diagnosis not present

## 2014-07-29 DIAGNOSIS — M533 Sacrococcygeal disorders, not elsewhere classified: Secondary | ICD-10-CM | POA: Diagnosis not present

## 2014-07-29 DIAGNOSIS — M47816 Spondylosis without myelopathy or radiculopathy, lumbar region: Secondary | ICD-10-CM | POA: Insufficient documentation

## 2014-07-29 DIAGNOSIS — G894 Chronic pain syndrome: Secondary | ICD-10-CM | POA: Diagnosis not present

## 2014-07-29 NOTE — Progress Notes (Signed)
Subjective:    Patient ID: Andrea Lowery, female    DOB: 1940-07-27, 74 y.o.   MRN: [id number] CC:  Left side greater than right side low back pain HPI 74 year old female with history of MGUS who had onset of right-sided low back pain about a year ago  She has had no trauma to that area no recent falls. Patient does have radiation from the low back on the right side to the right knee. No radiating pain on the left side. Pain is gradually increasing with time. She has tried Tylenol which has not been very helpful and is also tried tramadol which has also not been very helpful. Has not tried any physical therapy, had an injection from a PA at her rheumatologist's office that was helpful  Records from rheumatology office show that the patient had sacroiliac injections without x-ray guidance.Patient had some relief but only for a week or so Other diagnoses include knee osteoarthritis. Recommendation was a pain management referral  Patient has no numbness or tingling in the lower extremities. No muscle weakness. She is rather sedentary and describes herself as a couch potato. Pain Inventory Average Pain 7 Pain Right Now 7 My pain is constant, sharp and aching  In the last 24 hours, has pain interfered with the following? General activity 5 Relation with others 5 Enjoyment of life 5 What TIME of day is your pain at its worst? morning  And evening Sleep (in general) Poor  Pain is worse with: walking, standing and some activites Pain improves with: rest, medication and injections Relief from Meds: 5  Mobility walk without assistance how many minutes can you walk? 5 ability to climb steps?  yes do you drive?  yes  Function retired  Neuro/Psych spasms anxiety  Prior Studies Any changes since last visit?  no  Physicians involved in your care Any changes since last visit?  no   Family History  Problem Relation Age of Onset  . Diabetes Father   . Heart failure Father     History   Social History  . Marital Status: Divorced    Spouse Name: N/A  . Number of Children: N/A  . Years of Education: N/A   Social History Main Topics  . Smoking status: Former Smoker    Quit date: 02/04/2006  . Smokeless tobacco: Never Used  . Alcohol Use: No  . Drug Use: No  . Sexual Activity: No   Other Topics Concern  . None   Social History Narrative   Past Surgical History  Procedure Laterality Date  . Cholecystectomy  1976  . Tonsillectomy  1965  . Abdominal hysterectomy  1969  . Plantar fascia release    . US echocardiography  09/20/2008    borderline LVH,mild TR,AOV mildly sclerotic w/ca+ of the leaflets  . Nm myoview ltd  02/13/2010    No ischemia   Past Medical History  Diagnosis Date  . Asthma   . Hypertension   . OSA on CPAP   . Dyslipidemia   . Hypothyroidism   . LBBB (left bundle branch block)   . MGUS (monoclonal gammopathy of unknown significance)   . Diabetes mellitus without complication    BP 779/39 mmHg  Pulse 96  Resp 14  SpO2 98%  Opioid Risk Score:   Fall Risk Score: Low Fall Risk (0-5 points)`1  Depression screen PHQ 2/9  Depression screen PHQ 2/9 09/28/2013  Decreased Interest 0  Down, Depressed, Hopeless 0  PHQ - 2 Score 0  Review of Systems  Constitutional: Positive for appetite change.       Poor appetite  HENT: Negative.   Eyes: Negative.   Respiratory: Positive for cough and shortness of breath.   Cardiovascular: Positive for leg swelling.  Gastrointestinal: Positive for diarrhea and constipation.  Endocrine: Negative.   Musculoskeletal: Positive for myalgias, back pain and arthralgias.  Skin: Negative.   Allergic/Immunologic: Negative.   Neurological: Positive for numbness.       Pain radiates into leg  Hematological: Negative.   Psychiatric/Behavioral: The patient is nervous/anxious.        Objective:   Physical Exam  Constitutional: She is oriented to person, place, and time. She appears  well-developed and well-nourished.  HENT:  Head: Normocephalic and atraumatic.  Eyes: Conjunctivae are normal. Pupils are equal, round, and reactive to light.  Musculoskeletal:       Right hip: She exhibits decreased range of motion. She exhibits no tenderness and no deformity.       Right knee: She exhibits decreased range of motion. No tenderness found.       Left knee: She exhibits decreased range of motion. No tenderness found.       Lumbar back: She exhibits decreased range of motion and tenderness. She exhibits no deformity and no spasm.  Neurological: She is alert and oriented to person, place, and time. She displays no atrophy.  Reflex Scores:      Patellar reflexes are 1+ on the right side and 1+ on the left side.      Achilles reflexes are 1+ on the right side and 1+ on the left side. Negative straight leg raising test Motor strength is 5/5 bilateral hip flexor and knee extensor and ankle dorsiflexor plantar flexor  Psychiatric: She has a normal mood and affect.  Nursing note and vitals reviewed.  Mild kyphosis minimal dextroconvex scoliosis       Assessment & Plan:  1. Chronic right-sided low back pain. 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 There is also some degenerative changes at bilateral sacroiliac joints. The patient has had short-term relief of right-sided low back pain after palpation guided sacroiliac injection. This is unlikely to be intra-articular therefore recommend repeat injection under fluoroscopic guidance. If this is of only partial benefit consider right-sided L3 L4 L5 medial branch blocks to address facet arthropathy-related pain. Do not see any clinical evidence of radiculopathy.  Recommend outpatient physical therapy to address reduced range of motion as well as postural issues

## 2014-07-29 NOTE — Patient Instructions (Signed)
The next injection will be sacroiliac under x-ray guidance this will give a better response.  We may also need to do lumbar medial branch blocks which block the arthritic joints in the low back area.  I think he would benefit from some physical therapy as well so I will order this.

## 2014-07-30 LAB — PMP ALCOHOL METABOLITE (ETG): Ethyl Glucuronide (EtG): NEGATIVE ng/mL

## 2014-08-02 LAB — BENZODIAZEPINES (GC/LC/MS), URINE
Alprazolam metabolite (GC/LC/MS), ur confirm: 52 ng/mL (ref <25)
Clonazepam metabolite (GC/LC/MS), ur confirm: NEGATIVE ng/mL (ref <25)
Flurazepam metabolite (GC/LC/MS), ur confirm: NEGATIVE ng/mL (ref <50)
Lorazepam (GC/LC/MS), ur confirm: NEGATIVE ng/mL (ref <50)
Midazolam (GC/LC/MS), ur confirm: NEGATIVE ng/mL (ref <50)
Nordiazepam (GC/LC/MS), ur confirm: NEGATIVE ng/mL (ref <50)
Oxazepam (GC/LC/MS), ur confirm: NEGATIVE ng/mL (ref <50)
Temazepam (GC/LC/MS), ur confirm: NEGATIVE ng/mL (ref <50)
Triazolam metabolite (GC/LC/MS), ur confirm: NEGATIVE ng/mL (ref <50)

## 2014-08-03 LAB — PRESCRIPTION MONITORING PROFILE (SOLSTAS)
Amphetamine/Meth: NEGATIVE ng/mL
Barbiturate Screen, Urine: NEGATIVE ng/mL
Buprenorphine, Urine: NEGATIVE ng/mL
Cannabinoid Scrn, Ur: NEGATIVE ng/mL
Carisoprodol, Urine: NEGATIVE ng/mL
Cocaine Metabolites: NEGATIVE ng/mL
Creatinine, Urine: 150.36 mg/dL (ref >20.0)
Fentanyl, Ur: NEGATIVE ng/mL
MDMA URINE: NEGATIVE ng/mL
Meperidine, Ur: NEGATIVE ng/mL
Methadone Screen, Urine: NEGATIVE ng/mL
Nitrites, Initial: NEGATIVE ug/mL
Opiate Screen, Urine: NEGATIVE ng/mL
Oxycodone Screen, Ur: NEGATIVE ng/mL
Propoxyphene: NEGATIVE ng/mL
Tapentadol, urine: NEGATIVE ng/mL
Tramadol Scrn, Ur: NEGATIVE ng/mL
Zolpidem, Urine: NEGATIVE ng/mL
pH, Initial: 5.1 pH (ref 4.5–8.9)

## 2014-08-09 ENCOUNTER — Ambulatory Visit (INDEPENDENT_AMBULATORY_CARE_PROVIDER_SITE_OTHER): Payer: Medicare HMO | Admitting: Endocrinology

## 2014-08-09 ENCOUNTER — Encounter: Payer: Self-pay | Admitting: Endocrinology

## 2014-08-09 VITALS — BP 132/70 | HR 85 | Temp 98.5°F | Ht 67.0 in | Wt 188.0 lb

## 2014-08-09 DIAGNOSIS — E042 Nontoxic multinodular goiter: Secondary | ICD-10-CM | POA: Diagnosis not present

## 2014-08-09 MED ORDER — LEVOTHYROXINE SODIUM 50 MCG PO TABS: 50.0000 ug | ORAL_TABLET | Freq: Every day | ORAL | Status: DC

## 2014-08-09 NOTE — Progress Notes (Signed)
Subjective:    Patient ID: Andrea Lowery, female    DOB: [birth year], 74 y.o.   MRN: [id number]  HPI Pt was noted to have a nodule at the thyroid in 2016, on a CT done for an unrelated reason.  she has no h/o XRT or surgery to the neck.   Pt reports hypothyroidism was dx'ed in approx 2010.  She took synthroid for a brief time, but pt says it was d/c'ed because she was told she no longer needed it.  she has never taken kelp or any other type of non-prescribed thyroid product.  she has never been on amiodarone or lithium.  She has slight dry skin throughout the body, but no assoc itching. Past Medical History  Diagnosis Date  . Asthma   . Hypertension   . OSA on CPAP   . Dyslipidemia   . Hypothyroidism   . LBBB (left bundle branch block)   . MGUS (monoclonal gammopathy of unknown significance)   . Diabetes mellitus without complication   . Arthritis   . MGUS (monoclonal gammopathy of unknown significance)     Past Surgical History  Procedure Laterality Date  . Cholecystectomy  1976  . Tonsillectomy  1965  . Abdominal hysterectomy  1969  . Plantar fascia release    . US echocardiography  09/20/2008    borderline LVH,mild TR,AOV mildly sclerotic w/ca+ of the leaflets  . Nm myoview ltd  02/13/2010    No ischemia    History   Social History  . Marital Status: Divorced    Spouse Name: N/A  . Number of Children: N/A  . Years of Education: N/A   Occupational History  . Not on file.   Social History Main Topics  . Smoking status: Former Smoker    Quit date: 02/04/2006  . Smokeless tobacco: Never Used  . Alcohol Use: No  . Drug Use: No  . Sexual Activity: No   Other Topics Concern  . Not on file   Social History Narrative    Current Outpatient Prescriptions on File Prior to Visit  Medication Sig Dispense Refill  . acetaminophen (TYLENOL) 500 MG tablet Take 500 mg by mouth every 6 (six) hours as needed for mild pain.    Marland Kitchen albuterol (PROVENTIL HFA;VENTOLIN HFA) 108  (90 BASE) MCG/ACT inhaler Inhale 2 puffs into the lungs every 4 (four) hours as needed for shortness of breath. For shortness of breath and wheezing    . beclomethasone (QVAR) 80 MCG/ACT inhaler Inhale 2 puffs into the lungs 2 (two) times daily.    . cetirizine (ZYRTEC) 10 MG tablet Take 10 mg by mouth daily.     . cycloSPORINE (RESTASIS) 0.05 % ophthalmic emulsion Place 1 drop into both eyes at bedtime.     . diclofenac sodium (VOLTAREN) 1 % GEL Apply 2 g topically daily as needed (Knee pain).     Marland Kitchen EPINEPHrine 0.3 mg/0.3 mL IJ SOAJ injection Inject 0.3 mg into the muscle daily as needed (bee stings).     Marland Kitchen esomeprazole (NEXIUM) 40 MG capsule Take 40 mg by mouth 2 (two) times daily.     . eszopiclone (LUNESTA) 2 MG TABS tablet Take 1 tablet (2 mg total) by mouth at bedtime as needed for sleep. Take immediately before bedtime 30 tablet 1  . fluticasone (FLONASE) 50 MCG/ACT nasal spray Place 2 sprays into both nostrils as needed for allergies.     . furosemide (LASIX) 20 MG tablet Take 2 tablets (40 mg total)  by mouth daily. 60 tablet 6  . metFORMIN (GLUCOPHAGE) 500 MG tablet Take 500 mg by mouth daily.    . metoprolol succinate (TOPROL XL) 25 MG 24 hr tablet Take 0.5 tablets (12.5 mg total) by mouth daily. (Patient taking differently: Take 25 mg by mouth daily. ) 15 tablet 6  . montelukast (SINGULAIR) 10 MG tablet Take 10 mg by mouth at bedtime.    . Olopatadine HCl (PATADAY) 0.2 % SOLN Apply 1 drop to eye daily as needed (Allergies).     . rosuvastatin (CRESTOR) 40 MG tablet Take 1 tablet (40 mg total) by mouth daily. 30 tablet 6  . traMADol (ULTRAM) 50 MG tablet Take 50 mg by mouth every 6 (six) hours as needed for moderate pain.   0  . valsartan-hydrochlorothiazide (DIOVAN-HCT) 160-12.5 MG per tablet Take 1 tablet by mouth daily.     No current facility-administered medications on file prior to visit.    Allergies  Allergen Reactions  . Bee Venom Anaphylaxis    Yellow jackets  .  Penicillins Hives    Family History  Problem Relation Age of Onset  . Diabetes Father   . Heart failure Father   . Thyroid disease Sister     BP 132/70 mmHg  Pulse 85  Temp(Src) 98.5 F (36.9 C) (Oral)  Ht 5\' 7"  (1.702 m)  Wt 188 lb (85.276 kg)  BMI 29.44 kg/m2  SpO2 95%    Review of Systems denies memory loss, hair loss, muscle cramps, sob, weight gain, constipation, numbness, blurry vision, cold intolerance, myalgias, rhinorrhea, easy bruising, and syncope.     Objective:   Physical Exam VS: see vs page GEN: no distress HEAD: head: no deformity eyes: no periorbital swelling, no proptosis external nose and ears are normal mouth: no lesion seen NECK: supple, thyroid is not enlarged CHEST WALL: no deformity LUNGS:  Clear to auscultation CV: reg rate and rhythm, no murmur ABD: abdomen is soft, nontender.  no hepatosplenomegaly.  not distended.  no hernia MUSCULOSKELETAL: muscle bulk and strength are grossly normal.  no obvious joint swelling.  gait is normal and steady EXTEMITIES: no deformity.  no ulcer on the feet.  feet are of normal color and temp.  no edema PULSES: dorsalis pedis intact bilat.  no carotid bruit NEURO:  cn 2-12 grossly intact.   readily moves all 4's.  sensation is intact to touch on the feet SKIN:  Normal texture and temperature.  No rash or suspicious lesion is visible.   NODES:  None palpable at the neck PSYCH: alert, well-oriented.  Does not appear anxious nor depressed.   outside test results are reviewed: TSH=8  i have reviewed outside records: pt was noted to have thyroid nodule on [place], but it is not palpable.   Radiol: i reviewed CT report from 06/20/14, and [place] report from 07/07/14    Assessment & Plan:  Multinodular goiter, new, uncertain etiology Hypothyroidism: mild. i have sent a prescription to your pharmacy, for synthroid.   Dry skin: uncertain if thyroid-related.  i told pt to see how it is on synthroid.  Patient is advised the  following: Patient Instructions  Please do the thyroid ultrasound. Based on the results. I will probably request for you to have a biopsy, guided by the ultrasound.   After the biopsy, please start taking a thyroid hormone pill.  You would probably need to take this for the rest of your life. Please come back for a follow-up appointment in 3 months.

## 2014-08-09 NOTE — Patient Instructions (Addendum)
Please do the thyroid ultrasound. Based on the results. I will probably request for you to have a biopsy, guided by the ultrasound.   After the biopsy, please start taking a thyroid hormone pill.  You would probably need to take this for the rest of your life. Please come back for a follow-up appointment in 3 months.

## 2014-08-10 ENCOUNTER — Ambulatory Visit
Admission: RE | Admit: 2014-08-10 | Discharge: 2014-08-10 | Disposition: A | Discharge status: Home / Self Care | Payer: Medicare HMO | Source: Ambulatory Visit | Attending: Endocrinology | Admitting: Endocrinology

## 2014-08-10 DIAGNOSIS — E042 Nontoxic multinodular goiter: Secondary | ICD-10-CM

## 2014-08-15 ENCOUNTER — Telehealth: Payer: Self-pay | Admitting: Endocrinology

## 2014-08-15 NOTE — Telephone Encounter (Signed)
Pt requesting call back for results

## 2014-08-15 NOTE — Telephone Encounter (Signed)
See below

## 2014-08-15 NOTE — Telephone Encounter (Signed)
Left pt a VM to see which results she is seeking.

## 2014-08-16 NOTE — Progress Notes (Signed)
Urine drug screen for this encounter is consistent for prescribed medication 

## 2014-08-25 ENCOUNTER — Encounter: Payer: Self-pay | Admitting: Physical Medicine & Rehabilitation

## 2014-08-25 ENCOUNTER — Ambulatory Visit (HOSPITAL_BASED_OUTPATIENT_CLINIC_OR_DEPARTMENT_OTHER): Payer: Medicare HMO | Admitting: Physical Medicine & Rehabilitation

## 2014-08-25 ENCOUNTER — Encounter: Payer: Medicare HMO | Attending: Physical Medicine & Rehabilitation

## 2014-08-25 VITALS — BP 153/57 | HR 80 | Resp 14

## 2014-08-25 DIAGNOSIS — Z5181 Encounter for therapeutic drug level monitoring: Secondary | ICD-10-CM | POA: Insufficient documentation

## 2014-08-25 DIAGNOSIS — M533 Sacrococcygeal disorders, not elsewhere classified: Secondary | ICD-10-CM | POA: Diagnosis not present

## 2014-08-25 DIAGNOSIS — G894 Chronic pain syndrome: Secondary | ICD-10-CM | POA: Insufficient documentation

## 2014-08-25 DIAGNOSIS — Z79899 Other long term (current) drug therapy: Secondary | ICD-10-CM | POA: Diagnosis not present

## 2014-08-25 DIAGNOSIS — M47816 Spondylosis without myelopathy or radiculopathy, lumbar region: Secondary | ICD-10-CM | POA: Insufficient documentation

## 2014-08-25 NOTE — Patient Instructions (Signed)
Sacroiliac injection was performed today. A combination of a naming medicine plus a cortisone medicine was injected. The injection was done under x-ray guidance. This procedure has been performed to help reduce low back and buttocks pain as well as potentially hip pain. The duration of this injection is variable lasting from hours to  Months. It may repeated if needed. 

## 2014-08-25 NOTE — Progress Notes (Signed)

## 2014-08-25 NOTE — Progress Notes (Signed)
  PROCEDURE RECORD [facility]   Name: Andrea Lowery DOB:1940/10/09 MRN: [id number]  Date:08/25/2014  Physician: Alysia Penna, MD    Nurse/CMA: MaryBeth Ginkel  Allergies:  Allergies  Allergen Reactions  . Bee Venom Anaphylaxis    Yellow jackets  . Penicillins Hives    Consent Signed: Yes.    Is patient diabetic? No.  CBG today?   Pregnant: No. LMP: No LMP recorded. Patient has had a hysterectomy. (age 74-55)  Anticoagulants: no Anti-inflammatory: no Antibiotics: no  Procedure: right sacroiliac steroid injection Position: Prone Start Time:  1:50 End Time:  1:54 Fluoro Time: 9  RN/CMA MaryBeth Ginkel MaryBeth Ginkel    Time 1:30PM 1:56    BP 153/57 160/72    Pulse 80 86    Respirations 14 14    O2 Sat 98 99    S/S1 6 6    Pain Level 6/10  2/10     D/C home with [place], patient A & O X 3, D/C instructions reviewed, and sits independently.

## 2014-09-02 ENCOUNTER — Ambulatory Visit: Payer: Medicare HMO | Admitting: Physical Medicine & Rehabilitation

## 2014-09-12 ENCOUNTER — Telehealth: Payer: Self-pay | Admitting: *Deleted

## 2014-09-12 DIAGNOSIS — R600 Localized edema: Secondary | ICD-10-CM

## 2014-09-12 NOTE — Telephone Encounter (Signed)
May call in Robaxin 500 mg 3 times a day #45 no refills

## 2014-09-12 NOTE — Telephone Encounter (Signed)
Andrea Lowery is calling about her back "going out" on her today. She said this has happened one time before when she bent over to put on some panty hose and it did the same thing.  It is not the same as the pain for her back injections--that has worked and she has another one scheduled 10/04/14.  I asked if she thought it might be a spasm and she thinks maybe it is what is causing the pain. Do you want to try her on something for spasms? She was asking about an appointment which you do not have available. Please advise. (UDS for initial new pt encounter was consistent)

## 2014-09-13 MED ORDER — METHOCARBAMOL 500 MG PO TABS
500.0000 mg | ORAL_TABLET | Freq: Three times a day (TID) | ORAL | Status: DC
Start: 2014-09-13 — End: 2015-07-24

## 2014-09-13 NOTE — Telephone Encounter (Signed)
Notified Mardene Celeste med sent to pharmacy

## 2014-09-21 DIAGNOSIS — E039 Hypothyroidism, unspecified: Secondary | ICD-10-CM | POA: Insufficient documentation

## 2014-10-04 ENCOUNTER — Ambulatory Visit (HOSPITAL_BASED_OUTPATIENT_CLINIC_OR_DEPARTMENT_OTHER): Payer: Medicare HMO | Admitting: Physical Medicine & Rehabilitation

## 2014-10-04 ENCOUNTER — Encounter: Payer: Self-pay | Admitting: Physical Medicine & Rehabilitation

## 2014-10-04 ENCOUNTER — Encounter: Payer: Medicare HMO | Attending: Physical Medicine & Rehabilitation

## 2014-10-04 DIAGNOSIS — M533 Sacrococcygeal disorders, not elsewhere classified: Secondary | ICD-10-CM | POA: Insufficient documentation

## 2014-10-04 DIAGNOSIS — M47816 Spondylosis without myelopathy or radiculopathy, lumbar region: Secondary | ICD-10-CM | POA: Diagnosis not present

## 2014-10-04 DIAGNOSIS — Z79899 Other long term (current) drug therapy: Secondary | ICD-10-CM | POA: Insufficient documentation

## 2014-10-04 DIAGNOSIS — G894 Chronic pain syndrome: Secondary | ICD-10-CM | POA: Insufficient documentation

## 2014-10-04 DIAGNOSIS — Z5181 Encounter for therapeutic drug level monitoring: Secondary | ICD-10-CM | POA: Diagnosis not present

## 2014-10-04 NOTE — Progress Notes (Signed)
  PROCEDURE RECORD [facility]   Name: Andrea Lowery DOB:March 03, 1940 MRN: [id number]  Date:10/04/2014  Physician: Alysia Penna, MD    Nurse/CMA: Wenda Overland  Allergies:  Allergies  Allergen Reactions  . Bee Venom Anaphylaxis    Yellow jackets  . Penicillins Hives    Consent Signed: Yes.    Is patient diabetic? No.  CBG today?  Pregnant: No. LMP: No LMP recorded. Patient has had a hysterectomy. (age 74-55)  Anticoagulants: no Anti-inflammatory: no Antibiotics: no  Procedure: right sacroiliac steroid injection  Position: Prone Start Time: 138 End Time: 141 Fluoro Time: 10 sec  RN/CMA Mancel Parsons Sybil Shumaker    Time 1:10 PM 148    BP 158/61 183/70    Pulse 75 79    Respirations 14 14    O2 Sat 97 97    S/S 6 6    Pain Level 9/10 6/10     D/C home with sister, patient A & O X 3, D/C instructions reviewed, and sits independently.

## 2014-10-04 NOTE — Patient Instructions (Signed)
Sacroiliac injection was performed today. A combination of a naming medicine plus a cortisone medicine was injected. The injection was done under x-ray guidance. This procedure has been performed to help reduce low back and buttocks pain as well as potentially hip pain. The duration of this injection is variable lasting from hours to  Months. It may repeated if needed. 

## 2014-10-04 NOTE — Progress Notes (Signed)

## 2014-10-05 DIAGNOSIS — J4520 Mild intermittent asthma, uncomplicated: Secondary | ICD-10-CM

## 2014-10-05 DIAGNOSIS — J45909 Unspecified asthma, uncomplicated: Secondary | ICD-10-CM | POA: Insufficient documentation

## 2014-10-05 DIAGNOSIS — K219 Gastro-esophageal reflux disease without esophagitis: Secondary | ICD-10-CM | POA: Insufficient documentation

## 2014-10-05 DIAGNOSIS — J309 Allergic rhinitis, unspecified: Secondary | ICD-10-CM | POA: Insufficient documentation

## 2014-10-11 ENCOUNTER — Telehealth: Payer: Self-pay | Admitting: *Deleted

## 2014-10-11 NOTE — Telephone Encounter (Signed)
May call inTylenol 3 one by mouth twice a day #60, no refills

## 2014-10-11 NOTE — Telephone Encounter (Signed)
Nevea called and said her her injection on her sacroiliac did not help and she is in major pain.  She needs medication.

## 2014-10-12 MED ORDER — ACETAMINOPHEN-CODEINE #3 300-30 MG PO TABS: 1.0000 | ORAL_TABLET | Freq: Two times a day (BID) | ORAL | Status: DC | PRN

## 2014-10-12 NOTE — Telephone Encounter (Signed)
Tylenol # 3 called to pharmacy and Andrea Lowery notified.

## 2014-11-04 ENCOUNTER — Ambulatory Visit (HOSPITAL_BASED_OUTPATIENT_CLINIC_OR_DEPARTMENT_OTHER): Payer: Medicare HMO | Admitting: Physical Medicine & Rehabilitation

## 2014-11-04 ENCOUNTER — Encounter: Payer: Medicare HMO | Attending: Physical Medicine & Rehabilitation

## 2014-11-04 ENCOUNTER — Encounter: Payer: Self-pay | Admitting: Physical Medicine & Rehabilitation

## 2014-11-04 VITALS — BP 150/81 | HR 79 | Resp 14

## 2014-11-04 DIAGNOSIS — M7061 Trochanteric bursitis, right hip: Secondary | ICD-10-CM | POA: Diagnosis not present

## 2014-11-04 DIAGNOSIS — Z5181 Encounter for therapeutic drug level monitoring: Secondary | ICD-10-CM | POA: Insufficient documentation

## 2014-11-04 DIAGNOSIS — M47816 Spondylosis without myelopathy or radiculopathy, lumbar region: Secondary | ICD-10-CM | POA: Insufficient documentation

## 2014-11-04 DIAGNOSIS — G894 Chronic pain syndrome: Secondary | ICD-10-CM | POA: Insufficient documentation

## 2014-11-04 DIAGNOSIS — M533 Sacrococcygeal disorders, not elsewhere classified: Secondary | ICD-10-CM | POA: Insufficient documentation

## 2014-11-04 DIAGNOSIS — Z79899 Other long term (current) drug therapy: Secondary | ICD-10-CM | POA: Diagnosis not present

## 2014-11-04 MED ORDER — ACETAMINOPHEN-CODEINE #3 300-30 MG PO TABS: 1.0000 | ORAL_TABLET | Freq: Two times a day (BID) | ORAL | Status: DC | PRN

## 2014-11-04 NOTE — Patient Instructions (Signed)
Please ask your primary care physician whether or not you've had an erythrocyte sedimentation rate which is also called a sedimentation rate or ESR This is used to evaluate patient's for an inflammatory condition called temporal arteritis which may cause headache

## 2014-11-04 NOTE — Progress Notes (Signed)
Subjective:    Patient ID: Andrea Lowery, female    DOB: 1940-08-13, 74 y.o.   MRN: [id number]  HPI 74 year old female with history of right-sided low back pain and hip pain. She has undergone sacroiliac injection under fluoroscopic guidance on 2 occasions both of which were helpful in alleviating her pain. Her back pain is doing better however she does notice more hip pain on the right side. She's had no falls or trauma to that area.Her pain is mainly lateral and does not increased with walking. Instead laying on her side seems to aggravate it most    Pain Inventory Average Pain 5 Pain Right Now 5 My pain is aching  In the last 24 hours, has pain interfered with the following? General activity 0 Relation with others 0 Enjoyment of life 0 What TIME of day is your pain at its worst? morning, night  Sleep (in general) Fair  Pain is worse with: walking and bending Pain improves with: rest and medication Relief from Meds: 7  Mobility walk without assistance how many minutes can you walk? 10 ability to climb steps?  yes do you drive?  yes Do you have any goals in this area?  yes  Function not employed: date last employed .  Neuro/Psych spasms  Prior Studies Any changes since last visit?  yes  Physicians involved in your care Any changes since last visit?  no   Family History  Problem Relation Age of Onset  . Diabetes Father   . Heart failure Father   . Thyroid disease Sister    Social History   Social History  . Marital Status: Divorced    Spouse Name: N/A  . Number of Children: N/A  . Years of Education: N/A   Social History Main Topics  . Smoking status: Former Smoker    Quit date: 02/04/2006  . Smokeless tobacco: Never Used  . Alcohol Use: No  . Drug Use: No  . Sexual Activity: No   Other Topics Concern  . None   Social History Narrative   Past Surgical History  Procedure Laterality Date  . Cholecystectomy  1976  . Tonsillectomy  1965    . Abdominal hysterectomy  1969  . Plantar fascia release    . US echocardiography  09/20/2008    borderline LVH,mild TR,AOV mildly sclerotic w/ca+ of the leaflets  . Nm myoview ltd  02/13/2010    No ischemia   Past Medical History  Diagnosis Date  . Asthma   . Hypertension   . OSA on CPAP   . Dyslipidemia   . Hypothyroidism   . LBBB (left bundle branch block)   . MGUS (monoclonal gammopathy of unknown significance)   . Diabetes mellitus without complication   . Arthritis   . MGUS (monoclonal gammopathy of unknown significance)    BP 150/81 mmHg  Pulse 79  Resp 14  SpO2 98%  Opioid Risk Score:   Fall Risk Score:  `1  Depression screen PHQ 2/9  Depression screen [facility] 2/9 08/25/2014 07/29/2014 09/28/2013  Decreased Interest 0 0 0  Down, Depressed, Hopeless 0 1 0  PHQ - 2 Score 0 1 0  Altered sleeping 1 1 -  Tired, decreased energy 2 1 -  Change in appetite 2 1 -  Feeling bad or failure about yourself  1 1 -  Trouble concentrating 1 1 -  Moving slowly or fidgety/restless 1 0 -  Suicidal thoughts 0 0 -  PHQ-9 Score 8 6 -  Review of Systems  Neurological:       Spasms   All other systems reviewed and are negative.      Objective:   Physical Exam  Constitutional: She is oriented to person, place, and time. She appears well-developed and well-nourished.  HENT:  Head: Normocephalic and atraumatic.  Eyes: Conjunctivae and EOM are normal. Pupils are equal, round, and reactive to light.  Neck: Normal range of motion.  Musculoskeletal:       Right hip: She exhibits bony tenderness. She exhibits normal range of motion.       Lumbar back: She exhibits decreased range of motion and tenderness. She exhibits no deformity.  Tenderness over the right greater trochanter  Neurological: She is alert and oriented to person, place, and time.  Psychiatric: She has a normal mood and affect.  Nursing note and vitals reviewed.         Assessment & Plan:  1. 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 She may have repeat injection in about 2 months should she have recurrence  2. Right hip trochanteric bursitis: Pain interferes with sleep and persist despite medication management including narcotic analgesics. Due to her age and comorbidities would not recommend nonsteroidal anti-inflammatories. We'll do a hip bursa injection today  Trochanteric bursa injection With or without ultrasound guidance  Indication Trochanteric bursitis. Exam has tenderness over the greater trochanter of the hip. Pain has not responded to conservative care such as exercise therapy and oral medications. Pain interferes with sleep or with mobility Informed consent was obtained after describing risks and benefits of the procedure with the patient these include bleeding bruising and infection. Patient has signed written consentPatient placed in a lateral decubitus position with the affected hip superior needle,  Point of maximal pain was palpated marked and prepped with Betadine 1.50ml of 1%Lidocaine injected Into skin and subcutaneous tissue . Then, Unknown Jim MS 3 entered with a 21g 2 inch needle to bone contact. Needle slightly withdrawn then 6mg  of betamethasone with 4 cc 1% lidocaine were injected. Patient tolerated procedure well. Post procedure instructions given.

## 2014-11-06 ENCOUNTER — Inpatient Hospital Stay (HOSPITAL_COMMUNITY)
Admission: EM | Admit: 2014-11-06 | Discharge: 2014-11-08 | DRG: 516 | Disposition: A | Discharge status: Home / Self Care | Payer: Medicare HMO | Attending: Internal Medicine | Admitting: Internal Medicine

## 2014-11-06 ENCOUNTER — Emergency Department (HOSPITAL_COMMUNITY): Payer: Medicare HMO

## 2014-11-06 ENCOUNTER — Encounter (HOSPITAL_COMMUNITY): Payer: Self-pay | Admitting: Nurse Practitioner

## 2014-11-06 DIAGNOSIS — M1990 Unspecified osteoarthritis, unspecified site: Secondary | ICD-10-CM | POA: Diagnosis present

## 2014-11-06 DIAGNOSIS — I5030 Unspecified diastolic (congestive) heart failure: Secondary | ICD-10-CM | POA: Diagnosis present

## 2014-11-06 DIAGNOSIS — Z9989 Dependence on other enabling machines and devices: Secondary | ICD-10-CM

## 2014-11-06 DIAGNOSIS — G4733 Obstructive sleep apnea (adult) (pediatric): Secondary | ICD-10-CM | POA: Diagnosis present

## 2014-11-06 DIAGNOSIS — Z7984 Long term (current) use of oral hypoglycemic drugs: Secondary | ICD-10-CM

## 2014-11-06 DIAGNOSIS — R519 Headache, unspecified: Secondary | ICD-10-CM | POA: Diagnosis present

## 2014-11-06 DIAGNOSIS — I110 Hypertensive heart disease with heart failure: Secondary | ICD-10-CM | POA: Diagnosis present

## 2014-11-06 DIAGNOSIS — M316 Other giant cell arteritis: Secondary | ICD-10-CM | POA: Diagnosis not present

## 2014-11-06 DIAGNOSIS — D649 Anemia, unspecified: Secondary | ICD-10-CM | POA: Diagnosis present

## 2014-11-06 DIAGNOSIS — J45909 Unspecified asthma, uncomplicated: Secondary | ICD-10-CM | POA: Diagnosis present

## 2014-11-06 DIAGNOSIS — E785 Hyperlipidemia, unspecified: Secondary | ICD-10-CM | POA: Diagnosis present

## 2014-11-06 DIAGNOSIS — E1169 Type 2 diabetes mellitus with other specified complication: Secondary | ICD-10-CM

## 2014-11-06 DIAGNOSIS — I5032 Chronic diastolic (congestive) heart failure: Secondary | ICD-10-CM | POA: Diagnosis present

## 2014-11-06 DIAGNOSIS — E119 Type 2 diabetes mellitus without complications: Secondary | ICD-10-CM | POA: Diagnosis present

## 2014-11-06 DIAGNOSIS — Z79899 Other long term (current) drug therapy: Secondary | ICD-10-CM

## 2014-11-06 DIAGNOSIS — D472 Monoclonal gammopathy: Secondary | ICD-10-CM | POA: Diagnosis present

## 2014-11-06 DIAGNOSIS — E039 Hypothyroidism, unspecified: Secondary | ICD-10-CM | POA: Diagnosis present

## 2014-11-06 DIAGNOSIS — I10 Essential (primary) hypertension: Secondary | ICD-10-CM | POA: Diagnosis present

## 2014-11-06 DIAGNOSIS — Z87891 Personal history of nicotine dependence: Secondary | ICD-10-CM

## 2014-11-06 DIAGNOSIS — E876 Hypokalemia: Secondary | ICD-10-CM | POA: Diagnosis present

## 2014-11-06 DIAGNOSIS — E1165 Type 2 diabetes mellitus with hyperglycemia: Secondary | ICD-10-CM

## 2014-11-06 DIAGNOSIS — I447 Left bundle-branch block, unspecified: Secondary | ICD-10-CM | POA: Diagnosis present

## 2014-11-06 DIAGNOSIS — R51 Headache: Secondary | ICD-10-CM | POA: Diagnosis not present

## 2014-11-06 LAB — CBC WITH DIFFERENTIAL/PLATELET
Basophils Absolute: 0 K/uL (ref 0.0–0.1)
Basophils Relative: 1 %
Eosinophils Absolute: 0.1 K/uL (ref 0.0–0.7)
Eosinophils Relative: 2 %
HCT: 35.3 % — ABNORMAL LOW (ref 36.0–46.0)
Hemoglobin: 11.8 g/dL — ABNORMAL LOW (ref 12.0–15.0)
Lymphocytes Relative: 36 %
Lymphs Abs: 2.4 K/uL (ref 0.7–4.0)
MCH: 27.7 pg (ref 26.0–34.0)
MCHC: 33.4 g/dL (ref 30.0–36.0)
MCV: 82.9 fL (ref 78.0–100.0)
Monocytes Absolute: 0.7 K/uL (ref 0.1–1.0)
Monocytes Relative: 10 %
Neutro Abs: 3.4 K/uL (ref 1.7–7.7)
Neutrophils Relative %: 51 %
Platelets: 199 K/uL (ref 150–400)
RBC: 4.26 MIL/uL (ref 3.87–5.11)
RDW: 14.5 % (ref 11.5–15.5)
WBC: 6.6 K/uL (ref 4.0–10.5)

## 2014-11-06 LAB — BASIC METABOLIC PANEL WITH GFR
Anion gap: 10 (ref 5–15)
BUN: 8 mg/dL (ref 6–20)
CO2: 28 mmol/L (ref 22–32)
Calcium: 9.5 mg/dL (ref 8.9–10.3)
Chloride: 101 mmol/L (ref 101–111)
Creatinine, Ser: 0.92 mg/dL (ref 0.44–1.00)
GFR calc Af Amer: >60 mL/min
GFR calc non Af Amer: >60 mL/min
Glucose, Bld: 129 mg/dL — ABNORMAL HIGH (ref 65–99)
Potassium: 3.1 mmol/L — ABNORMAL LOW (ref 3.5–5.1)
Sodium: 139 mmol/L (ref 135–145)

## 2014-11-06 LAB — C-REACTIVE PROTEIN: CRP: 0.5 mg/dL

## 2014-11-06 LAB — SEDIMENTATION RATE: Sed Rate: 40 mm/h — ABNORMAL HIGH (ref 0–22)

## 2014-11-06 MED ORDER — HYDROCODONE-ACETAMINOPHEN 5-325 MG PO TABS
1.0000 | ORAL_TABLET | Freq: Once | ORAL | Status: AC
  Administered 2014-11-06: 1 via ORAL
  Filled 2014-11-06: qty 1

## 2014-11-06 NOTE — ED Provider Notes (Signed)
CSN: [id number]     Arrival date & time 11/06/14  1836 History   First MD Initiated Contact with Patient 11/06/14 1921     Chief Complaint  Patient presents with  . Headache     (Consider location/radiation/quality/duration/timing/severity/associated sxs/prior Treatment) HPI Comments: Andrea Lowery is a 74 y.o F with a pmhx of MGUS, DM who presents to the emergency department today c/o pain in her left temple that has been worsening over the last couple of days, left jaw pain with chewing and new blurry vision in her left eye. Patient states that she has had a nagging pain in her left temple for the last couple of months and was seen in the emergency department for it in July. Patient states she was told it was attributed to her thyroid nodules. However over the last couple of days the pain has increased in her L temple and today she was chewing food and the pain became severe and unbearable so she came to the emergency department. Patient was seen by a dentist for this and was told was not related to her dentition. Patient's today and ibuprofen with no relief. Patient also complaining of increased blurry vision over the last 3 days in her left eye. Patient wears glasses. Denies hip or shoulder pain, fever, difficult to swallowing, chest pain, shortness of breath.   Patient is a 74 y.o. female presenting with headaches. The history is provided by the patient.  Headache   Past Medical History  Diagnosis Date  . Asthma   . Hypertension   . OSA on CPAP   . Dyslipidemia   . Hypothyroidism   . LBBB (left bundle branch block)   . MGUS (monoclonal gammopathy of unknown significance)   . Diabetes mellitus without complication ([place])   . Arthritis   . MGUS (monoclonal gammopathy of unknown significance)    Past Surgical History  Procedure Laterality Date  . Cholecystectomy  1976  . Tonsillectomy  1965  . Abdominal hysterectomy  1969  . Plantar fascia release    . US echocardiography   09/20/2008    borderline LVH,mild TR,AOV mildly sclerotic w/ca+ of the leaflets  . Nm myoview ltd  02/13/2010    No ischemia   Family History  Problem Relation Age of Onset  . Diabetes Father   . Heart failure Father   . Thyroid disease Sister    Social History  Substance Use Topics  . Smoking status: Former Smoker    Quit date: 02/04/2006  . Smokeless tobacco: Never Used  . Alcohol Use: No   OB History    No data available     Review of Systems  Neurological: Positive for headaches.  All other systems reviewed and are negative.     Allergies  Bee venom and Penicillins  Home Medications   Prior to Admission medications   Medication Sig Start Date End Date Taking? Authorizing Provider  albuterol (PROVENTIL HFA;VENTOLIN HFA) 108 (90 BASE) MCG/ACT inhaler Inhale 2 puffs into the lungs every 4 (four) hours as needed for shortness of breath. For shortness of breath and wheezing   Yes Historical Provider, MD  beclomethasone (QVAR) 80 MCG/ACT inhaler Inhale 2 puffs into the lungs 2 (two) times daily.   Yes Historical Provider, MD  cetirizine (ZYRTEC) 10 MG tablet Take 10 mg by mouth daily.    Yes Historical Provider, MD  cycloSPORINE (RESTASIS) 0.05 % ophthalmic emulsion Place 1 drop into both eyes at bedtime.    Yes Historical Provider, MD  diclofenac sodium (VOLTAREN) 1 % GEL Apply 2 g topically daily as needed (Knee pain).    Yes Historical Provider, MD  EPINEPHrine 0.3 mg/0.3 mL IJ SOAJ injection Inject 0.3 mg into the muscle daily as needed (bee stings).    Yes Historical Provider, MD  esomeprazole (NEXIUM) 40 MG capsule Take 40 mg by mouth 2 (two) times daily.    Yes Historical Provider, MD  eszopiclone (LUNESTA) 2 MG TABS tablet Take 1 tablet (2 mg total) by mouth at bedtime as needed for sleep. Take immediately before bedtime 04/15/14  Yes Troy Sine, MD  fluticasone [facility]) 50 MCG/ACT nasal spray Place 2 sprays into both nostrils as needed for allergies.  12/27/13   Yes Historical Provider, MD  furosemide (LASIX) 20 MG tablet Take 2 tablets (40 mg total) by mouth daily. 01/13/14  Yes Pixie Casino, MD  levothyroxine (SYNTHROID, LEVOTHROID) 50 MCG tablet Take 1 tablet (50 mcg total) by mouth daily. 08/09/14  Yes Renato Shin, MD  metFORMIN (GLUCOPHAGE) 500 MG tablet Take 500 mg by mouth daily.   Yes Historical Provider, MD  metoprolol succinate (TOPROL XL) 25 MG 24 hr tablet Take 0.5 tablets (12.5 mg total) by mouth daily. Patient taking differently: Take 25 mg by mouth daily.  07/29/13  Yes Pixie Casino, MD  montelukast (SINGULAIR) 10 MG tablet Take 10 mg by mouth at bedtime.   Yes Historical Provider, MD  nystatin cream (MYCOSTATIN) Apply 1 application topically daily as needed. For irritation 10/31/14  Yes Historical Provider, MD  Olopatadine HCl (PATADAY) 0.2 % SOLN Apply 1 drop to eye daily as needed (Allergies).    Yes Historical Provider, MD  rosuvastatin (CRESTOR) 40 MG tablet Take 1 tablet (40 mg total) by mouth daily. 01/13/14  Yes Pixie Casino, MD  valsartan-hydrochlorothiazide (DIOVAN-HCT) 160-12.5 MG per tablet Take 1 tablet by mouth daily.   Yes Historical Provider, MD  acetaminophen-codeine (TYLENOL #3) 300-30 MG tablet Take 1 tablet by mouth 2 (two) times daily as needed for moderate pain. 11/04/14   Charlett Blake, MD  methocarbamol (ROBAXIN) 500 MG tablet Take 1 tablet (500 mg total) by mouth 3 (three) times daily. 09/13/14   Charlett Blake, MD   BP 163/76 mmHg  Pulse 59  Temp(Src) 98.5 F (36.9 C) (Oral)  Resp 16  Ht _0  (1.651 m)  Wt 195 lb 6.4 oz (88.633 kg)  BMI 32.52 kg/m2  SpO2 99% Physical Exam  Constitutional: She is oriented to person, place, and time. She appears well-developed and well-nourished. No distress.  HENT:  Head: Normocephalic and atraumatic.  Mouth/Throat: Oropharynx is clear and moist. No oropharyngeal exudate.  Visual acuity 20/50 bilaterally. However pt states she feels that her left eye vision is  much blurrier than the right.  No evidence of pulsating artery around temple. Exquisite TTP of left temple and left TMJ.  Buccal mucosa non erythematous. No sign of dental abscess. Pt without teeth.  Eyes: Conjunctivae and EOM are normal. Pupils are equal, round, and reactive to light. Right eye exhibits no discharge. Left eye exhibits no discharge. No scleral icterus.  Neck: Neck supple. No thyromegaly present.  Cardiovascular: Normal rate, regular rhythm, normal heart sounds and intact distal pulses.  Exam reveals no gallop and no friction rub.   No murmur heard. Pulmonary/Chest: Effort normal and breath sounds normal. No respiratory distress. She has no wheezes. She has no rales. She exhibits no tenderness.  Abdominal: Soft. She exhibits no distension and no mass.  There is no tenderness. There is no rebound and no guarding.  Musculoskeletal: Normal range of motion. She exhibits no edema or tenderness.  Neurological: She is alert and oriented to person, place, and time.  Strength 5/5 throughout. No sensory deficits.    Skin: Skin is warm and dry. No rash noted. She is not diaphoretic. No erythema. No pallor.  Psychiatric: She has a normal mood and affect. Her behavior is normal.  Nursing note and vitals reviewed.   ED Course  Procedures (including critical care time) Labs Review Labs Reviewed  BASIC METABOLIC PANEL - Abnormal; Notable for the following:    Potassium 3.1 (*)    Glucose, Bld 129 (*)    All other components within normal limits  CBC WITH DIFFERENTIAL/PLATELET - Abnormal; Notable for the following:    Hemoglobin 11.8 (*)    HCT 35.3 (*)    All other components within normal limits  SEDIMENTATION RATE - Abnormal; Notable for the following:    Sed Rate 40 (*)    All other components within normal limits  C-REACTIVE PROTEIN    Imaging Review Ct Head Wo Contrast  11/06/2014   CLINICAL DATA:  Headache and LEFT jaw pain. Elevated blood pressure.  EXAM: CT HEAD WITHOUT  CONTRAST  TECHNIQUE: Contiguous axial images were obtained from the base of the skull through the vertex without intravenous contrast.  COMPARISON:  06/30/2014.  FINDINGS: No evidence for acute infarction, hemorrhage, mass lesion, hydrocephalus, or extra-axial fluid. Normal for age cerebral volume. Mild white matter disease, nonspecific. Calvarium intact. No sinus or mastoid fluid. Negative orbits.  Within limits for visualization on noncontrast head CT, there is no TMJ dislocation. There may be mild degenerative change at the LEFT TMJ.  IMPRESSION: No acute intracranial abnormality. No change from most recent prior.   Electronically Signed   By: Staci Righter M.D.   On: 11/06/2014 21:41   I have personally reviewed and evaluated these images and lab results as part of my medical decision-making.   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 Concern for temporal arteritis. ESR 40. CT head negative. Patient given 60 mg prednisone. All other labs within normal limits.  No evidence of dental abscess.   Spoke with hospitalist to admit patient to [place]. Recommend consult to neuro.  Spoke with Dr. Janann Colonel with neuro who will consult in ED.  Vital signs stable.  Patient was discussed with and seen by Dr. Oleta Mouse who agrees with the treatment plan.      Dondra Spry [place], PA-C 11/07/14 0031  Forde Dandy, MD 11/07/14 [phone]

## 2014-11-06 NOTE — ED Notes (Signed)
PA to see and assess patient before RN assessment.

## 2014-11-06 NOTE — ED Notes (Signed)
Patient's Visual Acuity screening results: Bilateral near 20/50, Right eye near 20/50, Left eye near 20/50

## 2014-11-06 NOTE — ED Notes (Signed)
Visual Acuity completed.... Patient had on her eye glasses thru out the screening.Marland Kitchen

## 2014-11-06 NOTE — ED Notes (Signed)
She c/o several day history of sharp pain in L jaw, L cheek, L side of head, she was chewing food today and the pain became severe and unbearable so she came to ED. pain has not decreased since and intensifies every time she tried to chew. She was seen by dentist for this and was told it was not related to her dentition, was instructed to take ibuprofen but that didn't help. She is A&Ox4, resp e/u

## 2014-11-07 ENCOUNTER — Inpatient Hospital Stay (HOSPITAL_COMMUNITY): Payer: Medicare HMO | Admitting: Certified Registered Nurse Anesthetist

## 2014-11-07 ENCOUNTER — Encounter (HOSPITAL_COMMUNITY): Payer: Self-pay | Admitting: Internal Medicine

## 2014-11-07 ENCOUNTER — Encounter (HOSPITAL_COMMUNITY)
Admission: EM | Disposition: A | Discharge status: Home / Self Care | Payer: Self-pay | Source: Home / Self Care | Attending: Internal Medicine

## 2014-11-07 DIAGNOSIS — J45909 Unspecified asthma, uncomplicated: Secondary | ICD-10-CM | POA: Diagnosis present

## 2014-11-07 DIAGNOSIS — E039 Hypothyroidism, unspecified: Secondary | ICD-10-CM | POA: Diagnosis present

## 2014-11-07 DIAGNOSIS — I5032 Chronic diastolic (congestive) heart failure: Secondary | ICD-10-CM | POA: Diagnosis present

## 2014-11-07 DIAGNOSIS — E119 Type 2 diabetes mellitus without complications: Secondary | ICD-10-CM

## 2014-11-07 DIAGNOSIS — E1169 Type 2 diabetes mellitus with other specified complication: Secondary | ICD-10-CM | POA: Diagnosis not present

## 2014-11-07 DIAGNOSIS — R51 Headache: Secondary | ICD-10-CM

## 2014-11-07 DIAGNOSIS — I10 Essential (primary) hypertension: Secondary | ICD-10-CM | POA: Diagnosis not present

## 2014-11-07 DIAGNOSIS — E1165 Type 2 diabetes mellitus with hyperglycemia: Secondary | ICD-10-CM

## 2014-11-07 DIAGNOSIS — R519 Headache, unspecified: Secondary | ICD-10-CM | POA: Diagnosis present

## 2014-11-07 DIAGNOSIS — D472 Monoclonal gammopathy: Secondary | ICD-10-CM | POA: Diagnosis present

## 2014-11-07 DIAGNOSIS — M316 Other giant cell arteritis: Principal | ICD-10-CM

## 2014-11-07 DIAGNOSIS — I110 Hypertensive heart disease with heart failure: Secondary | ICD-10-CM | POA: Diagnosis present

## 2014-11-07 DIAGNOSIS — G4733 Obstructive sleep apnea (adult) (pediatric): Secondary | ICD-10-CM | POA: Diagnosis present

## 2014-11-07 DIAGNOSIS — Z87891 Personal history of nicotine dependence: Secondary | ICD-10-CM | POA: Diagnosis not present

## 2014-11-07 DIAGNOSIS — E785 Hyperlipidemia, unspecified: Secondary | ICD-10-CM | POA: Diagnosis present

## 2014-11-07 DIAGNOSIS — Z79899 Other long term (current) drug therapy: Secondary | ICD-10-CM | POA: Diagnosis not present

## 2014-11-07 DIAGNOSIS — I5030 Unspecified diastolic (congestive) heart failure: Secondary | ICD-10-CM | POA: Diagnosis present

## 2014-11-07 DIAGNOSIS — M1990 Unspecified osteoarthritis, unspecified site: Secondary | ICD-10-CM | POA: Diagnosis present

## 2014-11-07 DIAGNOSIS — I447 Left bundle-branch block, unspecified: Secondary | ICD-10-CM | POA: Diagnosis present

## 2014-11-07 DIAGNOSIS — Z7984 Long term (current) use of oral hypoglycemic drugs: Secondary | ICD-10-CM | POA: Diagnosis not present

## 2014-11-07 DIAGNOSIS — E876 Hypokalemia: Secondary | ICD-10-CM | POA: Diagnosis present

## 2014-11-07 DIAGNOSIS — D649 Anemia, unspecified: Secondary | ICD-10-CM | POA: Diagnosis present

## 2014-11-07 HISTORY — PX: ARTERY BIOPSY: SHX891

## 2014-11-07 LAB — CBC
HCT: 31.8 % — ABNORMAL LOW (ref 36.0–46.0)
Hemoglobin: 10.9 g/dL — ABNORMAL LOW (ref 12.0–15.0)
MCH: 28.4 pg (ref 26.0–34.0)
MCHC: 34.3 g/dL (ref 30.0–36.0)
MCV: 82.8 fL (ref 78.0–100.0)
Platelets: 187 10*3/uL (ref 150–400)
RBC: 3.84 MIL/uL — ABNORMAL LOW (ref 3.87–5.11)
RDW: 14.4 % (ref 11.5–15.5)
WBC: 4.8 10*3/uL (ref 4.0–10.5)

## 2014-11-07 LAB — COMPREHENSIVE METABOLIC PANEL
ALT: 16 U/L (ref 14–54)
AST: 18 U/L (ref 15–41)
Albumin: 3.1 g/dL — ABNORMAL LOW (ref 3.5–5.0)
Alkaline Phosphatase: 71 U/L (ref 38–126)
Anion gap: 5 (ref 5–15)
BUN: 6 mg/dL (ref 6–20)
CO2: 29 mmol/L (ref 22–32)
Calcium: 8.8 mg/dL — ABNORMAL LOW (ref 8.9–10.3)
Chloride: 102 mmol/L (ref 101–111)
Creatinine, Ser: 0.77 mg/dL (ref 0.44–1.00)
GFR calc Af Amer: >60 mL/min (ref >60)
GFR calc non Af Amer: >60 mL/min (ref >60)
Glucose, Bld: 123 mg/dL — ABNORMAL HIGH (ref 65–99)
Potassium: 3 mmol/L — ABNORMAL LOW (ref 3.5–5.1)
Sodium: 136 mmol/L (ref 135–145)
Total Bilirubin: 0.6 mg/dL (ref 0.3–1.2)
Total Protein: 6.3 g/dL — ABNORMAL LOW (ref 6.5–8.1)

## 2014-11-07 LAB — SURGICAL PCR SCREEN
MRSA, PCR: NEGATIVE
Staphylococcus aureus: NEGATIVE

## 2014-11-07 SURGERY — BIOPSY TEMPORAL ARTERY
Anesthesia: General

## 2014-11-07 MED ORDER — MIDAZOLAM HCL 2 MG/2ML IJ SOLN
INTRAMUSCULAR | Status: AC
  Filled 2014-11-07: qty 2

## 2014-11-07 MED ORDER — LIDOCAINE HCL (CARDIAC) 20 MG/ML IV SOLN
INTRAVENOUS | Status: DC | PRN
  Administered 2014-11-07: 40 mg via INTRAVENOUS

## 2014-11-07 MED ORDER — WHITE PETROLATUM GEL
Status: AC
  Administered 2014-11-07: 0.2
  Filled 2014-11-07: qty 1

## 2014-11-07 MED ORDER — MONTELUKAST SODIUM 10 MG PO TABS
10.0000 mg | ORAL_TABLET | Freq: Every day | ORAL | Status: DC
  Administered 2014-11-07: 10 mg via ORAL
  Filled 2014-11-07: qty 1

## 2014-11-07 MED ORDER — ALBUTEROL SULFATE (2.5 MG/3ML) 0.083% IN NEBU: 2.5000 mg | INHALATION_SOLUTION | RESPIRATORY_TRACT | Status: DC | PRN

## 2014-11-07 MED ORDER — METOPROLOL SUCCINATE ER 25 MG PO TB24
25.0000 mg | ORAL_TABLET | Freq: Every day | ORAL | Status: DC
Start: 2014-11-07 — End: 2014-11-08
  Administered 2014-11-07 – 2014-11-08 (×2): 25 mg via ORAL
  Filled 2014-11-07 (×2): qty 1

## 2014-11-07 MED ORDER — MAGNESIUM SULFATE 2 GM/50ML IV SOLN
2.0000 g | Freq: Once | INTRAVENOUS | Status: AC
Start: 2014-11-07 — End: 2014-11-07
  Administered 2014-11-07: 2 g via INTRAVENOUS
  Filled 2014-11-07: qty 50

## 2014-11-07 MED ORDER — LACTATED RINGERS IV SOLN
INTRAVENOUS | Status: DC | PRN
  Administered 2014-11-07: 17:00:00 via INTRAVENOUS

## 2014-11-07 MED ORDER — ROSUVASTATIN CALCIUM 40 MG PO TABS
40.0000 mg | ORAL_TABLET | Freq: Every day | ORAL | Status: DC
Start: 2014-11-07 — End: 2014-11-08
  Administered 2014-11-07 – 2014-11-08 (×2): 40 mg via ORAL
  Filled 2014-11-07 (×2): qty 1

## 2014-11-07 MED ORDER — LIDOCAINE-EPINEPHRINE 1 %-1:100000 IJ SOLN
INTRAMUSCULAR | Status: DC | PRN
  Administered 2014-11-07: 1 mL

## 2014-11-07 MED ORDER — LORATADINE 10 MG PO TABS
10.0000 mg | ORAL_TABLET | Freq: Every day | ORAL | Status: DC
  Administered 2014-11-07 – 2014-11-08 (×2): 10 mg via ORAL
  Filled 2014-11-07 (×2): qty 1

## 2014-11-07 MED ORDER — FLUTICASONE PROPIONATE 50 MCG/ACT NA SUSP: 2.0000 | Freq: Every day | NASAL | Status: DC | PRN

## 2014-11-07 MED ORDER — FUROSEMIDE 40 MG PO TABS
40.0000 mg | ORAL_TABLET | Freq: Every day | ORAL | Status: DC
  Administered 2014-11-07: 40 mg via ORAL
  Filled 2014-11-07 (×2): qty 1

## 2014-11-07 MED ORDER — ONDANSETRON HCL 4 MG/2ML IJ SOLN: 4.0000 mg | Freq: Once | INTRAMUSCULAR | Status: DC | PRN

## 2014-11-07 MED ORDER — EPHEDRINE SULFATE 50 MG/ML IJ SOLN
INTRAMUSCULAR | Status: DC | PRN
  Administered 2014-11-07: 10 mg via INTRAVENOUS

## 2014-11-07 MED ORDER — MORPHINE SULFATE (PF) 2 MG/ML IV SOLN
1.0000 mg | INTRAVENOUS | Status: DC | PRN
  Administered 2014-11-07 – 2014-11-08 (×4): 1 mg via INTRAVENOUS
  Filled 2014-11-07 (×4): qty 1

## 2014-11-07 MED ORDER — FENTANYL CITRATE (PF) 250 MCG/5ML IJ SOLN
INTRAMUSCULAR | Status: AC
  Filled 2014-11-07: qty 5

## 2014-11-07 MED ORDER — BACITRACIN ZINC 500 UNIT/GM EX OINT
TOPICAL_OINTMENT | CUTANEOUS | Status: AC
  Filled 2014-11-07: qty 28.35

## 2014-11-07 MED ORDER — LEVOTHYROXINE SODIUM 50 MCG PO TABS
50.0000 ug | ORAL_TABLET | Freq: Every day | ORAL | Status: DC
  Administered 2014-11-07 – 2014-11-08 (×2): 50 ug via ORAL
  Filled 2014-11-07 (×2): qty 1

## 2014-11-07 MED ORDER — POTASSIUM CHLORIDE CRYS ER 20 MEQ PO TBCR
60.0000 meq | EXTENDED_RELEASE_TABLET | Freq: Four times a day (QID) | ORAL | Status: AC
  Administered 2014-11-07: 60 meq via ORAL
  Filled 2014-11-07: qty 3

## 2014-11-07 MED ORDER — ZOLPIDEM TARTRATE 5 MG PO TABS: 5.0000 mg | ORAL_TABLET | Freq: Every evening | ORAL | Status: DC | PRN

## 2014-11-07 MED ORDER — PROPOFOL 10 MG/ML IV BOLUS
INTRAVENOUS | Status: DC | PRN
  Administered 2014-11-07: 130 mg via INTRAVENOUS

## 2014-11-07 MED ORDER — ONDANSETRON HCL 4 MG/2ML IJ SOLN
INTRAMUSCULAR | Status: DC | PRN
  Administered 2014-11-07: 4 mg via INTRAVENOUS

## 2014-11-07 MED ORDER — 0.9 % SODIUM CHLORIDE (POUR BTL) OPTIME
TOPICAL | Status: DC | PRN
  Administered 2014-11-07: 1000 mL

## 2014-11-07 MED ORDER — LACTATED RINGERS IV SOLN
INTRAVENOUS | Status: DC
  Administered 2014-11-07: 16:00:00 via INTRAVENOUS

## 2014-11-07 MED ORDER — HYDROCHLOROTHIAZIDE 12.5 MG PO CAPS
12.5000 mg | ORAL_CAPSULE | Freq: Every day | ORAL | Status: DC
  Administered 2014-11-07 – 2014-11-08 (×2): 12.5 mg via ORAL
  Filled 2014-11-07 (×2): qty 1

## 2014-11-07 MED ORDER — LACTATED RINGERS IV SOLN: INTRAVENOUS | Status: DC

## 2014-11-07 MED ORDER — FENTANYL CITRATE (PF) 100 MCG/2ML IJ SOLN: 25.0000 ug | INTRAMUSCULAR | Status: DC | PRN

## 2014-11-07 MED ORDER — PANTOPRAZOLE SODIUM 40 MG PO TBEC
40.0000 mg | DELAYED_RELEASE_TABLET | Freq: Every day | ORAL | Status: DC
  Administered 2014-11-07 – 2014-11-08 (×2): 40 mg via ORAL
  Filled 2014-11-07 (×2): qty 1

## 2014-11-07 MED ORDER — PREDNISONE 50 MG PO TABS
60.0000 mg | ORAL_TABLET | Freq: Every day | ORAL | Status: DC
  Administered 2014-11-08: 60 mg via ORAL
  Filled 2014-11-07 (×2): qty 1

## 2014-11-07 MED ORDER — IRBESARTAN 150 MG PO TABS
150.0000 mg | ORAL_TABLET | Freq: Every day | ORAL | Status: DC
  Administered 2014-11-07 – 2014-11-08 (×2): 150 mg via ORAL
  Filled 2014-11-07 (×2): qty 1

## 2014-11-07 MED ORDER — BUDESONIDE 0.25 MG/2ML IN SUSP
0.2500 mg | Freq: Two times a day (BID) | RESPIRATORY_TRACT | Status: DC
  Administered 2014-11-07 – 2014-11-08 (×3): 0.25 mg via RESPIRATORY_TRACT
  Filled 2014-11-07 (×2): qty 2

## 2014-11-07 MED ORDER — CYCLOSPORINE 0.05 % OP EMUL
1.0000 [drp] | Freq: Every day | OPHTHALMIC | Status: DC
  Administered 2014-11-07: 1 [drp] via OPHTHALMIC
  Filled 2014-11-07 (×2): qty 1

## 2014-11-07 MED ORDER — LIDOCAINE-EPINEPHRINE 1 %-1:100000 IJ SOLN
INTRAMUSCULAR | Status: AC
  Filled 2014-11-07: qty 1

## 2014-11-07 MED ORDER — VALSARTAN-HYDROCHLOROTHIAZIDE 160-12.5 MG PO TABS: 1.0000 | ORAL_TABLET | Freq: Every day | ORAL | Status: DC

## 2014-11-07 SURGICAL SUPPLY — 45 items
AIRSTRIP 4 3/4X3 1/4 7185 (GAUZE/BANDAGES/DRESSINGS) IMPLANT
ATTRACTOMAT 16X20 MAGNETIC DRP (DRAPES) IMPLANT
BLADE SURG 15 STRL LF DISP TIS (BLADE) IMPLANT
BLADE SURG 15 STRL SS (BLADE)
BNDG CONFORM 2 STRL LF (GAUZE/BANDAGES/DRESSINGS) IMPLANT
BNDG GAUZE ELAST 4 BULKY (GAUZE/BANDAGES/DRESSINGS) IMPLANT
CANISTER SUCTION 2500CC (MISCELLANEOUS) IMPLANT
CATH ROBINSON RED A/P 16FR (CATHETERS) IMPLANT
CLEANER TIP ELECTROSURG 2X2 (MISCELLANEOUS) ×2 IMPLANT
CONT SPEC 4OZ CLIKSEAL STRL BL (MISCELLANEOUS) ×2 IMPLANT
COVER SURGICAL LIGHT HANDLE (MISCELLANEOUS) ×2 IMPLANT
DRAIN PENROSE 1/4X12 LTX STRL (WOUND CARE) IMPLANT
DRAPE PROXIMA HALF (DRAPES) IMPLANT
DRSG EMULSION OIL 3X3 NADH (GAUZE/BANDAGES/DRESSINGS) IMPLANT
ELECT COATED BLADE 2.86 ST (ELECTRODE) ×2 IMPLANT
ELECT NEEDLE TIP 2.8 STRL (NEEDLE) IMPLANT
ELECT REM PT RETURN 9FT ADLT (ELECTROSURGICAL) ×2
ELECTRODE REM PT RTRN 9FT ADLT (ELECTROSURGICAL) ×1 IMPLANT
GAUZE SPONGE 4X4 12PLY STRL (GAUZE/BANDAGES/DRESSINGS) IMPLANT
GAUZE SPONGE 4X4 16PLY XRAY LF (GAUZE/BANDAGES/DRESSINGS) IMPLANT
GLOVE ECLIPSE 7.5 STRL STRAW (GLOVE) ×2 IMPLANT
GOWN STRL REUS W/ TWL LRG LVL3 (GOWN DISPOSABLE) ×2 IMPLANT
GOWN STRL REUS W/TWL LRG LVL3 (GOWN DISPOSABLE) ×4
KIT BASIN OR (CUSTOM PROCEDURE TRAY) ×2 IMPLANT
KIT ROOM TURNOVER OR (KITS) ×2 IMPLANT
LIQUID BAND (GAUZE/BANDAGES/DRESSINGS) ×2 IMPLANT
NEEDLE 25GX 5/8IN NON SAFETY (NEEDLE) ×2 IMPLANT
NEEDLE 27GAX1X1/2 (NEEDLE) IMPLANT
NS IRRIG 1000ML POUR BTL (IV SOLUTION) ×2 IMPLANT
PAD ARMBOARD 7.5X6 YLW CONV (MISCELLANEOUS) ×4 IMPLANT
PENCIL FOOT CONTROL (ELECTRODE) ×2 IMPLANT
POUCH STERILIZING 3 X22 (STERILIZATION PRODUCTS) IMPLANT
SUT CHROMIC 4 0 P 3 18 (SUTURE) ×2 IMPLANT
SUT ETHILON 4 0 PS 2 18 (SUTURE) ×2 IMPLANT
SUT ETHILON 5 0 P 3 18 (SUTURE) ×1
SUT NYLON ETHILON 5-0 P-3 1X18 (SUTURE) ×1 IMPLANT
SUT SILK 4 0 REEL (SUTURE) ×2 IMPLANT
SWAB COLLECTION DEVICE MRSA (MISCELLANEOUS) IMPLANT
SYR BULB IRRIGATION 50ML (SYRINGE) IMPLANT
SYR TB 1ML LUER SLIP (SYRINGE) IMPLANT
TOWEL OR 17X24 6PK STRL BLUE (TOWEL DISPOSABLE) ×2 IMPLANT
TRAY ENT MC OR (CUSTOM PROCEDURE TRAY) ×2 IMPLANT
TUBE ANAEROBIC SPECIMEN COL (MISCELLANEOUS) IMPLANT
WATER STERILE IRR 1000ML POUR (IV SOLUTION) ×2 IMPLANT
YANKAUER SUCT BULB TIP NO VENT (SUCTIONS) IMPLANT

## 2014-11-07 NOTE — Consult Note (Signed)
Reason for Consult: Possible temporal arteritis  HPI:  Andrea Lowery is an 74 y.o. female who was admitted yesterday for evaluation of her left-sided headache and blurry vision. The patient has a history of diabetes mellitus, diastolic CHF, hypertension, hypothyroidism, and MGUS. Patient has been having headaches off and on for last 3 months since July. Pain is mostly on the left temporal area constant. Yesterday patient states her pain worsened with some left-sided blurred vision. Patient did not have any loss of function of upper or lower extremities. Denies any nausea, vomiting,or tearing of the eye. CT of the head was unremarkable in the ER. Sedimentation rate was around 40. Since patient's headache was associated blurred vision, temporal arteritis was under consideration and patient was given prednisone 60 mg and neurology consulted. Patient denies any palpitations, chest pain, shortness of breath, or any discharge from the nose mouth or eyes. Denies any neck rigidity. Denies any fever or chills.  Past Medical History  Diagnosis Date  . Asthma   . Hypertension   . OSA on CPAP   . Dyslipidemia   . Hypothyroidism   . LBBB (left bundle branch block)   . MGUS (monoclonal gammopathy of unknown significance)   . Diabetes mellitus without complication ([place])   . Arthritis   . MGUS (monoclonal gammopathy of unknown significance)     Past Surgical History  Procedure Laterality Date  . Cholecystectomy  1976  . Tonsillectomy  1965  . Abdominal hysterectomy  1969  . Plantar fascia release    . US echocardiography  09/20/2008    borderline LVH,mild TR,AOV mildly sclerotic w/ca+ of the leaflets  . Nm myoview ltd  02/13/2010    No ischemia    Family History  Problem Relation Age of Onset  . Diabetes Father   . Heart failure Father   . Thyroid disease Sister     Social History:  reports that she quit smoking about 8 years ago. She has never used smokeless tobacco. She reports that she  does not drink alcohol or use illicit drugs.  Allergies:  Allergies  Allergen Reactions  . Bee Venom Anaphylaxis    Yellow jackets  . Penicillins Hives    Prior to Admission medications   Medication Sig Start Date End Date Taking? Authorizing Provider  albuterol (PROVENTIL HFA;VENTOLIN HFA) 108 (90 BASE) MCG/ACT inhaler Inhale 2 puffs into the lungs every 4 (four) hours as needed for shortness of breath. For shortness of breath and wheezing   Yes Historical Provider, MD  beclomethasone (QVAR) 80 MCG/ACT inhaler Inhale 2 puffs into the lungs 2 (two) times daily.   Yes Historical Provider, MD  cetirizine (ZYRTEC) 10 MG tablet Take 10 mg by mouth daily.    Yes Historical Provider, MD  cycloSPORINE (RESTASIS) 0.05 % ophthalmic emulsion Place 1 drop into both eyes at bedtime.    Yes Historical Provider, MD  diclofenac sodium (VOLTAREN) 1 % GEL Apply 2 g topically daily as needed (Knee pain).    Yes Historical Provider, MD  EPINEPHrine 0.3 mg/0.3 mL IJ SOAJ injection Inject 0.3 mg into the muscle daily as needed (bee stings).    Yes Historical Provider, MD  esomeprazole (NEXIUM) 40 MG capsule Take 40 mg by mouth 2 (two) times daily.    Yes Historical Provider, MD  eszopiclone (LUNESTA) 2 MG TABS tablet Take 1 tablet (2 mg total) by mouth at bedtime as needed for sleep. Take immediately before bedtime 04/15/14  Yes Troy Sine, MD  fluticasone [facility])  50 MCG/ACT nasal spray Place 2 sprays into both nostrils as needed for allergies.  12/27/13  Yes Historical Provider, MD  furosemide (LASIX) 20 MG tablet Take 2 tablets (40 mg total) by mouth daily. 01/13/14  Yes Pixie Casino, MD  levothyroxine (SYNTHROID, LEVOTHROID) 50 MCG tablet Take 1 tablet (50 mcg total) by mouth daily. 08/09/14  Yes Renato Shin, MD  metFORMIN (GLUCOPHAGE) 500 MG tablet Take 500 mg by mouth daily.   Yes Historical Provider, MD  metoprolol succinate (TOPROL XL) 25 MG 24 hr tablet Take 0.5 tablets (12.5 mg total) by mouth  daily. Patient taking differently: Take 25 mg by mouth daily.  07/29/13  Yes Pixie Casino, MD  montelukast (SINGULAIR) 10 MG tablet Take 10 mg by mouth at bedtime.   Yes Historical Provider, MD  nystatin cream (MYCOSTATIN) Apply 1 application topically daily as needed. For irritation 10/31/14  Yes Historical Provider, MD  Olopatadine HCl (PATADAY) 0.2 % SOLN Apply 1 drop to eye daily as needed (Allergies).    Yes Historical Provider, MD  rosuvastatin (CRESTOR) 40 MG tablet Take 1 tablet (40 mg total) by mouth daily. 01/13/14  Yes Pixie Casino, MD  valsartan-hydrochlorothiazide (DIOVAN-HCT) 160-12.5 MG per tablet Take 1 tablet by mouth daily.   Yes Historical Provider, MD  acetaminophen-codeine (TYLENOL #3) 300-30 MG tablet Take 1 tablet by mouth 2 (two) times daily as needed for moderate pain. 11/04/14   Charlett Blake, MD  methocarbamol (ROBAXIN) 500 MG tablet Take 1 tablet (500 mg total) by mouth 3 (three) times daily. 09/13/14   Charlett Blake, MD    Medications:  I have reviewed the patient's current medications. Scheduled: . budesonide (PULMICORT) nebulizer solution  0.25 mg Nebulization BID  . cycloSPORINE  1 drop Both Eyes QHS  . furosemide  40 mg Oral Daily  . irbesartan  150 mg Oral Daily   And  . hydrochlorothiazide  12.5 mg Oral Daily  . levothyroxine  50 mcg Oral QAC breakfast  . loratadine  10 mg Oral Daily  . metoprolol succinate  25 mg Oral Daily  . montelukast  10 mg Oral QHS  . pantoprazole  40 mg Oral Daily  . rosuvastatin  40 mg Oral Daily   EPP:IRJJOACZY, fluticasone, morphine injection, zolpidem  Results for orders placed or performed during the hospital encounter of 11/06/14 (from the past 48 hour(s))  Basic metabolic panel     Status: Abnormal   Collection Time: 11/06/14  7:39 PM  Result Value Ref Range   Sodium 139 135 - 145 mmol/L   Potassium 3.1 (L) 3.5 - 5.1 mmol/L   Chloride 101 101 - 111 mmol/L   CO2 28 22 - 32 mmol/L   Glucose, Bld 129 (H)  65 - 99 mg/dL   BUN 8 6 - 20 mg/dL   Creatinine, Ser 0.92 0.44 - 1.00 mg/dL   Calcium 9.5 8.9 - 10.3 mg/dL   GFR calc non Af Amer >60 >60 mL/min   GFR calc Af Amer >60 >60 mL/min    Comment: (NOTE) The eGFR has been calculated using the CKD EPI equation. This calculation has not been validated in all clinical situations. eGFR's persistently <60 mL/min signify possible Chronic Kidney Disease.    Anion gap 10 5 - 15  CBC with Differential     Status: Abnormal   Collection Time: 11/06/14  7:39 PM  Result Value Ref Range   WBC 6.6 4.0 - 10.5 K/uL   RBC 4.26 3.87 -  5.11 MIL/uL   Hemoglobin 11.8 (L) 12.0 - 15.0 g/dL   HCT 35.3 (L) 36.0 - 46.0 %   MCV 82.9 78.0 - 100.0 fL   MCH 27.7 26.0 - 34.0 pg   MCHC 33.4 30.0 - 36.0 g/dL   RDW 14.5 11.5 - 15.5 %   Platelets 199 150 - 400 K/uL   Neutrophils Relative % 51 %   Neutro Abs 3.4 1.7 - 7.7 K/uL   Lymphocytes Relative 36 %   Lymphs Abs 2.4 0.7 - 4.0 K/uL   Monocytes Relative 10 %   Monocytes Absolute 0.7 0.1 - 1.0 K/uL   Eosinophils Relative 2 %   Eosinophils Absolute 0.1 0.0 - 0.7 K/uL   Basophils Relative 1 %   Basophils Absolute 0.0 0.0 - 0.1 K/uL  C-reactive protein     Status: None   Collection Time: 11/06/14  7:39 PM  Result Value Ref Range   CRP 0.5 <1.0 mg/dL  Sedimentation rate     Status: Abnormal   Collection Time: 11/06/14  8:17 PM  Result Value Ref Range   Sed Rate 40 (H) 0 - 22 mm/hr  Comprehensive metabolic panel     Status: Abnormal   Collection Time: 11/07/14  6:19 AM  Result Value Ref Range   Sodium 136 135 - 145 mmol/L   Potassium 3.0 (L) 3.5 - 5.1 mmol/L   Chloride 102 101 - 111 mmol/L   CO2 29 22 - 32 mmol/L   Glucose, Bld 123 (H) 65 - 99 mg/dL   BUN 6 6 - 20 mg/dL   Creatinine, Ser 0.77 0.44 - 1.00 mg/dL   Calcium 8.8 (L) 8.9 - 10.3 mg/dL   Total Protein 6.3 (L) 6.5 - 8.1 g/dL   Albumin 3.1 (L) 3.5 - 5.0 g/dL   AST 18 15 - 41 U/L   ALT 16 14 - 54 U/L   Alkaline Phosphatase 71 38 - 126 U/L   Total  Bilirubin 0.6 0.3 - 1.2 mg/dL   GFR calc non Af Amer >60 >60 mL/min   GFR calc Af Amer >60 >60 mL/min    Comment: (NOTE) The eGFR has been calculated using the CKD EPI equation. This calculation has not been validated in all clinical situations. eGFR's persistently <60 mL/min signify possible Chronic Kidney Disease.    Anion gap 5 5 - 15  CBC     Status: Abnormal   Collection Time: 11/07/14  6:19 AM  Result Value Ref Range   WBC 4.8 4.0 - 10.5 K/uL   RBC 3.84 (L) 3.87 - 5.11 MIL/uL   Hemoglobin 10.9 (L) 12.0 - 15.0 g/dL   HCT 31.8 (L) 36.0 - 46.0 %   MCV 82.8 78.0 - 100.0 fL   MCH 28.4 26.0 - 34.0 pg   MCHC 34.3 30.0 - 36.0 g/dL   RDW 14.4 11.5 - 15.5 %   Platelets 187 150 - 400 K/uL    Ct Head Wo Contrast  11/06/2014   CLINICAL DATA:  Headache and LEFT jaw pain. Elevated blood pressure.  EXAM: CT HEAD WITHOUT CONTRAST  TECHNIQUE: Contiguous axial images were obtained from the base of the skull through the vertex without intravenous contrast.  COMPARISON:  06/30/2014.  FINDINGS: No evidence for acute infarction, hemorrhage, mass lesion, hydrocephalus, or extra-axial fluid. Normal for age cerebral volume. Mild white matter disease, nonspecific. Calvarium intact. No sinus or mastoid fluid. Negative orbits.  Within limits for visualization on noncontrast head CT, there is no TMJ dislocation. There may  be mild degenerative change at the LEFT TMJ.  IMPRESSION: No acute intracranial abnormality. No change from most recent prior.   Electronically Signed   By: Staci Righter M.D.   On: 11/06/2014 21:41   Review of Systems  Neurological: Positive for headaches.  All other systems reviewed and are negative.  Blood pressure 145/69, pulse 70, temperature 98.3 F (36.8 C), temperature source Oral, resp. rate 18, height 5' 6"  (1.676 m), weight 86.592 kg (190 lb 14.4 oz), SpO2 95 %. Physical Exam  Constitutional: She is oriented to person, place, and time. She appears well-developed and  well-nourished. No distress.  Head: Normocephalic and atraumatic.  Ears: Normal auricles and ear canals. Mouth/Throat: Oropharynx is clear and moist. No oropharyngeal exudate.  Pt states her left eye vision is much blurrier than the right. Exquisite TTP of left temple and left TMJ. Buccal mucosa non erythematous. No sign of dental abscess. Pt without teeth.  Eyes: Conjunctivae and EOM are normal. Pupils are equal, round, and reactive to light. Right eye exhibits no discharge. Left eye exhibits no discharge. No scleral icterus.  Neck: Neck supple. No thyromegaly present.  Pulmonary/Chest: Effort normal and breath sounds normal. No respiratory distress. She has no wheezes. She has no rales. She exhibits no tenderness.  Musculoskeletal: Normal range of motion. She exhibits no edema or tenderness.  Neurological: She is alert and oriented to person, place, and time.  Strength 5/5 throughout. No sensory deficits.  Skin: Skin is warm and dry. No rash noted. She is not diaphoretic. No erythema. No pallor.  Psychiatric: She has a normal mood and affect. Her behavior is normal.   Assessment/Plan: Left sided headache, possible temporal arteritis. Plan biopsy of her left temporal artery in OR today.  TEOH,SUI W 11/07/2014, 12:14 PM

## 2014-11-07 NOTE — Progress Notes (Signed)
[facility] PROGRESS NOTE  Andrea Lowery TKP:[id number] DOB: 12/10/1940 DOA: 11/06/2014 PCP: Darden Amber, PA  HPI Andrea Lowery is a 74 yo female who presents to the ED for worsening headache with new onset L sided blurry vision. She has a history of intermittent headaches x3 months. Pain is constant and confined to left temporal area. No N/V, photophobia, phonophobia, or ipsilateral tearing/lacrimation. No focal neurological deficits. PMH includes: DM2, diastolic CHF, HTN, Hypothyroidism, and MGUS.   In the ED, CT of head unremarkable, ESR 40, and CRP WNL. Oral prednisone 60 mg QD initiated due to concern for temporal arteritis.   Subjective: Today the patient is doing well. Headache remains, but improving. Blurred vision improving. Reports an increase in lower leg swelling. No N/V/D, abdominal pain.No fever, chills, chest pain, SOB or palpitations. .   Assessment/Plan: Principle Problem:  Headache  - Etiology unknown, questionable temporal arteritis vs. trigeminal neuralgia  - ERS 40, CRP WNL (.5), CT with no evidence of acute abnormalities  - Neurology consulted, recommended consult for temporal artery biopsy  - ENT consulted for temporal artery biopsy, scheduled for today - ED initiated oral prednisone 60 mg QD, continue with steroids and Morphine pain management PRN   Active Problems:  Diabetes, Type 2  - Chronic, managed at home with Metformin  - Discontinued metformin and initiated carb-modified diet with sliding scale insulin on admission  - Continue sliding scale insulin/appropriate diet   Hypokalemia - 3.0 today, etiology unknown, but appears to be chronic - Patient reports taking potassium supplements BID at home, unknown dose, not on home-med list  - Replete orally and check BMP in AM  Diastolic CHF  - Chronic, Last EF was 55-60%, managed at home with Lasix and Metoprolol - Reports increase in peripheral swelling since admission, weights decreased from  admission (88 kg to 86 kg) - Continue at home medications and monitor daily weights   Essential Hypertension  - Chronic, managed at home with Diovan-HCT, Metoprolol  - Currently stable at 145/69, but flucutating, likely secondary to pre-procedure anxiety  - Continue at home medications   Hypothyroidism - Chronic with a history of mutinodular goiter, controlled at home with Synthroid  - Continue synthroid   Asthma - Chronic, controlled at home with Albuterol, QVAR, and Singulair  - No current wheezing, continue at home medications with Pulmicort nebulizer PRN   Seasonal Allergies - Chronic, triggers of Asthma, managed at home with Flonase and Claritin - Continue at home medications   HLD - Chronic, managed at home with Crestor  - Continue at home medications   MGUS with anemia - Chronic, 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   Code Status: Full  Family Communication: Family at bedside  Disposition Plan: Home when stable    Consultants:  Neurology   ENT   Procedures:  Temporal artery biopsy (scheduled for 11/07/14)  Antibiotics: None    Objective: Filed Vitals:   11/07/14 0650  BP: 145/69  Pulse: 70  Temp: 98.3 F (36.8 C)  Resp: 18   No intake or output data in the 24 hours ending 11/07/14 1136 Filed Weights   11/06/14 1848 11/07/14 0155  Weight: 88.633 kg (195 lb 6.4 oz) 86.592 kg (190 lb 14.4 oz)    Exam:   General:  Well-appearing woman, sitting in bed eating breakfast, no acute distress  Eyes: PERRLA, TTP on L temporal area  ENT: No discharge or tenderness, no cervical lymphadenopathy   Cardiovascular: RRR, no m/r/g. 1+ b/l LE edema   Respiratory: CTA  b/l, no wheeze or crackles  Abdomen: Soft, non-tender, non-distended   Skin: No rashes or lesions  Neuro: A&Ox3, no focal neurological deficits   Data Reviewed: Basic Metabolic Panel:  Recent Labs Lab 11/06/14 1939 11/07/14 0619  NA 139 136  K 3.1* 3.0*  CL 101 102  CO2 28 29  GLUCOSE 129*  123*  BUN 8 6  CREATININE 0.92 0.77  CALCIUM 9.5 8.8*   Liver Function Tests:  Recent Labs Lab 11/07/14 0619  AST 18  ALT 16  ALKPHOS 71  BILITOT 0.6  PROT 6.3*  ALBUMIN 3.1*   No results for input(s): LIPASE, AMYLASE in the last 168 hours. No results for input(s): AMMONIA in the last 168 hours. CBC:  Recent Labs Lab 11/06/14 1939 11/07/14 0619  WBC 6.6 4.8  NEUTROABS 3.4  --   HGB 11.8* 10.9*  HCT 35.3* 31.8*  MCV 82.9 82.8  PLT 199 187   Cardiac Enzymes: No results for input(s): CKTOTAL, CKMB, CKMBINDEX, TROPONINI in the last 168 hours. BNP (last 3 results) No results for input(s): BNP in the last 8760 hours.  ProBNP (last 3 results) No results for input(s): PROBNP in the last 8760 hours.  CBG: No results for input(s): GLUCAP in the last 168 hours.  No results found for this or any previous visit (from the past 240 hour(s)).   Studies: Ct Head Wo Contrast  11/06/2014   CLINICAL DATA:  Headache and LEFT jaw pain. Elevated blood pressure.  EXAM: CT HEAD WITHOUT CONTRAST  TECHNIQUE: Contiguous axial images were obtained from the base of the skull through the vertex without intravenous contrast.  COMPARISON:  06/30/2014.  FINDINGS: No evidence for acute infarction, hemorrhage, mass lesion, hydrocephalus, or extra-axial fluid. Normal for age cerebral volume. Mild white matter disease, nonspecific. Calvarium intact. No sinus or mastoid fluid. Negative orbits.  Within limits for visualization on noncontrast head CT, there is no TMJ dislocation. There may be mild degenerative change at the LEFT TMJ.  IMPRESSION: No acute intracranial abnormality. No change from most recent prior.   Electronically Signed   By: Staci Righter M.D.   On: 11/06/2014 21:41    Scheduled Meds: . budesonide (PULMICORT) nebulizer solution  0.25 mg Nebulization BID  . cycloSPORINE  1 drop Both Eyes QHS  . furosemide  40 mg Oral Daily  . irbesartan  150 mg Oral Daily   And  . hydrochlorothiazide   12.5 mg Oral Daily  . levothyroxine  50 mcg Oral QAC breakfast  . loratadine  10 mg Oral Daily  . metoprolol succinate  25 mg Oral Daily  . montelukast  10 mg Oral QHS  . pantoprazole  40 mg Oral Daily  . rosuvastatin  40 mg Oral Daily      Andrea Lowery, Student-PA  [facility] If 7PM-7AM, please contact night-coverage at [email or web address], password [email or web address] 11/07/2014, 11:36 AM  LOS: 0 days        Addendum  Patient seen and examined, chart and data base reviewed.  I agree with the above assessment and plan.  For full details please see Mrs. Andrea Lowery, Student-PA note.  I reviewed and amended the above note as appropriate.   Birdie Hopes, MD [facility] Pager: [phone] 11/07/2014, 2:25 PM

## 2014-11-07 NOTE — Anesthesia Preprocedure Evaluation (Addendum)
Anesthesia Evaluation  Patient identified by MRN, date of birth, ID band Patient awake    History of Anesthesia Complications Negative for: history of anesthetic complications  Airway Mallampati: II  TM Distance: >3 FB Neck ROM: Full    Dental  (+) Edentulous Upper, Partial Lower   Pulmonary asthma , former smoker,    breath sounds clear to auscultation       Cardiovascular hypertension, + Peripheral Vascular Disease, +CHF and + DOE  + dysrhythmias  Rhythm:Regular Rate:Normal     Neuro/Psych  Headaches,    GI/Hepatic GERD  ,  Endo/Other  diabetes, Type 2, Oral Hypoglycemic AgentsHypothyroidism   Renal/GU      Musculoskeletal  (+) Arthritis ,   Abdominal   Peds  Hematology   Anesthesia Other Findings   Reproductive/Obstetrics                            Anesthesia Physical Anesthesia Plan  ASA: III  Anesthesia Plan: General   Post-op Pain Management:    Induction: Intravenous  Airway Management Planned: LMA  Additional Equipment:   Intra-op Plan:   Post-operative Plan:   Informed Consent: I have reviewed the patients History and Physical, chart, labs and discussed the procedure including the risks, benefits and alternatives for the proposed anesthesia with the patient or authorized representative who has indicated his/her understanding and acceptance.     Plan Discussed with: CRNA and Anesthesiologist  Anesthesia Plan Comments:         Anesthesia Quick Evaluation

## 2014-11-07 NOTE — Anesthesia Procedure Notes (Signed)
Procedure Name: LMA Insertion Date/Time: 11/07/2014 5:27 PM Performed by: Trixie Deis A Pre-anesthesia Checklist: Patient identified, Emergency Drugs available, Suction available, Patient being monitored and Timeout performed Patient Re-evaluated:Patient Re-evaluated prior to inductionOxygen Delivery Method: Circle system utilized Preoxygenation: Pre-oxygenation with 100% oxygen Intubation Type: IV induction LMA: LMA inserted LMA Size: 4.0 Number of attempts: 1 Placement Confirmation: positive ETCO2 Tube secured with: Tape Dental Injury: Teeth and Oropharynx as per pre-operative assessment

## 2014-11-07 NOTE — Transfer of Care (Signed)
Immediate Anesthesia Transfer of Care Note  Patient: Andrea Lowery  Procedure(s) Performed: Procedure(s): BIOPSY TEMPORAL ARTERY (N/A)  Patient Location: PACU  Anesthesia Type:General  Level of Consciousness: awake, alert  and oriented  Airway & Oxygen Therapy: Patient Spontanous Breathing and Patient connected to nasal cannula oxygen  Post-op Assessment: Report given to RN, Post -op Vital signs reviewed and stable and Patient moving all extremities  Post vital signs: Reviewed and stable  Last Vitals:  Filed Vitals:   11/07/14 1814  BP:   Pulse: 74  Temp:   Resp:     Complications: No apparent anesthesia complications

## 2014-11-07 NOTE — Consult Note (Signed)
Consult Reason for Consult:headache, visual deficits Referring Physician: Dr Oleta Mouse [facility] ED  CC: headache  HPI: Andrea Lowery is an 74 y.o. female hx of HLD, DM, HTN presenting with headache and visual changes. Notes pain is in her left temple region. Has been present for the past few months but has been getting progressively worse over the past few days. Noted symptoms became worse today while eating. Over the last few days she has also noted increased blurred vision in her left eye.   ESR 40, CRP 0.5. Lab workup otherwise unremarkable. CT head imaging reviewed and overall unremarkable.    Past Medical History  Diagnosis Date  . Asthma   . Hypertension   . OSA on CPAP   . Dyslipidemia   . Hypothyroidism   . LBBB (left bundle branch block)   . MGUS (monoclonal gammopathy of unknown significance)   . Diabetes mellitus without complication ([place])   . Arthritis   . MGUS (monoclonal gammopathy of unknown significance)     Past Surgical History  Procedure Laterality Date  . Cholecystectomy  1976  . Tonsillectomy  1965  . Abdominal hysterectomy  1969  . Plantar fascia release    . US echocardiography  09/20/2008    borderline LVH,mild TR,AOV mildly sclerotic w/ca+ of the leaflets  . Nm myoview ltd  02/13/2010    No ischemia    Family History  Problem Relation Age of Onset  . Diabetes Father   . Heart failure Father   . Thyroid disease Sister     Social History:  reports that she quit smoking about 8 years ago. She has never used smokeless tobacco. She reports that she does not drink alcohol or use illicit drugs.  Allergies  Allergen Reactions  . Bee Venom Anaphylaxis    Yellow jackets  . Penicillins Hives    Medications: I have reviewed the patient's current medications.  ROS: Out of a complete 14 system review, the patient complains of only the following symptoms, and all other reviewed systems are negative. +headache  Physical Examination: Filed Vitals:    11/06/14 2330  BP: 165/67  Pulse: 60  Temp:   Resp:    Physical Exam  Constitutional: He appears well-developed and well-nourished.  Psych: Affect appropriate to situation Eyes: No scleral injection HENT: No OP obstrucion Head: Normocephalic. Tenderness to palpation over left temporal region Cardiovascular: Normal rate and regular rhythm.  Respiratory: Effort normal and breath sounds normal.  GI: Soft. Bowel sounds are normal. No distension. There is no tenderness.  Skin: WDI  Neurologic Examination Mental Status: Alert, oriented, thought content appropriate.  Speech fluent without evidence of aphasia.  Able to follow 3 step commands without difficulty. Cranial Nerves: II: optic discs not visualized, visual fields grossly normal, pupils equal, round, reactive to light and accommodation. VA 20/20 OD and 20/20 OS III,IV, VI: ptosis not present, extra-ocular motions intact bilaterally V,VII: smile symmetric, facial light touch sensation normal bilaterally VIII: hearing normal bilaterally IX,X: gag reflex present XI: trapezius strength/neck flexion strength normal bilaterally XII: tongue strength normal  Motor: Right : Upper extremity    Left:     Upper extremity 5/5 deltoid       5/5 deltoid 5/5 biceps      5/5 biceps  5/5 triceps      5/5 triceps 5/5 hand grip      5/5 hand grip  Lower extremity     Lower extremity 5/5 hip flexor      5/5 hip  flexor 5/5 quadricep      5/5 quadriceps  5/5 hamstrings     5/5 hamstrings 5/5 plantar flexion       5/5 plantar flexion 5/5 plantar extension     5/5 plantar extension Tone and bulk:normal tone throughout; no atrophy noted Sensory: Pinprick and light touch intact throughout, bilaterally Deep Tendon Reflexes: 2+ and symmetric throughout Plantars: Right: downgoing   Left: downgoing Cerebellar: normal finger-to-nose, and normal heel-to-shin test Gait: deferred  Laboratory Studies:   Basic Metabolic Panel:  Recent Labs Lab  11/06/14 1939  NA 139  K 3.1*  CL 101  CO2 28  GLUCOSE 129*  BUN 8  CREATININE 0.92  CALCIUM 9.5    Liver Function Tests: No results for input(s): AST, ALT, ALKPHOS, BILITOT, PROT, ALBUMIN in the last 168 hours. No results for input(s): LIPASE, AMYLASE in the last 168 hours. No results for input(s): AMMONIA in the last 168 hours.  CBC:  Recent Labs Lab 11/06/14 1939  WBC 6.6  NEUTROABS 3.4  HGB 11.8*  HCT 35.3*  MCV 82.9  PLT 199    Cardiac Enzymes: No results for input(s): CKTOTAL, CKMB, CKMBINDEX, TROPONINI in the last 168 hours.  BNP: Invalid input(s): POCBNP  CBG: No results for input(s): GLUCAP in the last 168 hours.  Microbiology: Results for orders placed or performed during the hospital encounter of 11/03/12  Urine culture     Status: None   Collection Time: 11/03/12 11:40 AM  Result Value Ref Range Status   Specimen Description URINE, CLEAN CATCH  Final   Special Requests NONE  Final   Culture  Setup Time   Final    11/03/2012 15:12 Performed at [place] Performed at [facility]  Final   Culture NO GROWTH Performed at [facility]  Final   Report Status 11/04/2012 FINAL  Final    Coagulation Studies: No results for input(s): LABPROT, INR in the last 72 hours.  Urinalysis: No results for input(s): COLORURINE, LABSPEC, PHURINE, GLUCOSEU, HGBUR, BILIRUBINUR, KETONESUR, PROTEINUR, UROBILINOGEN, NITRITE, LEUKOCYTESUR in the last 168 hours.  Invalid input(s): APPERANCEUR  Lipid Panel:  No results found for: CHOL, TRIG, HDL, CHOLHDL, VLDL, LDLCALC  HgbA1C: No results found for: HGBA1C  Urine Drug Screen:     Component Value Date/Time   LABOPIA NEG 07/29/2014 1405   COCAINSCRNUR NEG 07/29/2014 1405   LABBENZ PPS 07/29/2014 1405   AMPHETMU NEG 07/29/2014 1405   THCU NEG 07/29/2014 1405   LABBARB NEG 07/29/2014 1405    Alcohol Level: No results for input(s): ETH in the last 168  hours.    Imaging: Ct Head Wo Contrast  11/06/2014   CLINICAL DATA:  Headache and LEFT jaw pain. Elevated blood pressure.  EXAM: CT HEAD WITHOUT CONTRAST  TECHNIQUE: Contiguous axial images were obtained from the base of the skull through the vertex without intravenous contrast.  COMPARISON:  06/30/2014.  FINDINGS: No evidence for acute infarction, hemorrhage, mass lesion, hydrocephalus, or extra-axial fluid. Normal for age cerebral volume. Mild white matter disease, nonspecific. Calvarium intact. No sinus or mastoid fluid. Negative orbits.  Within limits for visualization on noncontrast head CT, there is no TMJ dislocation. There may be mild degenerative change at the LEFT TMJ.  IMPRESSION: No acute intracranial abnormality. No change from most recent prior.   Electronically Signed   By: Staci Righter M.D.   On: 11/06/2014 21:41     Assessment/Plan:  74y/o hx of HTN, HLD,  DM presenting with left sided temporal headache with subjective blurry vision in her left eye. ESR is elevated at 40, based on age is not markedly elevated.  Clinical description of her headache is concerning for possible temporal arteritis. With reported visual changes would error on the side of starting PO prednisone for possible temporal arteritis.   -start prednisone 74m daily -surgical consult for temporal artery biopsy -will continue to follow  PJim Like DO [facility] [phone] If 7pm- 7am, please page neurology on call as listed in [place] 11/07/2014, 12:04 AM

## 2014-11-07 NOTE — Op Note (Signed)
DATE OF PROCEDURE:  11/07/2014                              OPERATIVE REPORT  SURGEON:  Leta Baptist, MD  PREOPERATIVE DIAGNOSES: 1. Temporal arteritis  POSTOPERATIVE DIAGNOSES: 1. Temporal arteritis  PROCEDURE PERFORMED:  Temporal artery biopsy  ANESTHESIA:  General endotracheal tube anesthesia.  COMPLICATIONS:  None.  ESTIMATED BLOOD LOSS:  Minimal.  INDICATION FOR PROCEDURE:  Andrea Lowery is a 74 y.o. female who was admitted last night for treatment of her left-sided headache and blurry vision. Her sedimentation rate was elevated. There was a concern for left-sided temporal arteritis. ENT was therefore consult that to perform the temporal artery biopsy procedure. The risks, benefits, alternatives, and details of the procedure were discussed with the mother.  Questions were invited and answered.  Informed consent was obtained.  DESCRIPTION:  The patient was taken to the operating room and placed supine on the operating table.  General endotracheal tube anesthesia was administered by the anesthesiologist.  The patient was positioned and prepped and draped in a standard fashion for left temporal artery biopsy. 1% lidocaine with 1-100,000 epinephrine was infiltrated at the planned site of incision. A vertical incision was made over the left temporal artery. The temporal artery was confirmed with the Doppler device. A 2 cm segment of the temporal artery was obtained. The specimen was sent to the pathology department for permanent histologic identification. The care of the patient was turned over to the anesthesiologist.  The patient was awakened from anesthesia without difficulty.  She was extubated and transferred to the recovery room in good condition.  OPERATIVE FINDINGS: Temporal artery biopsy.  SPECIMEN:  Left temporal artery.  FOLLOWUP CARE:  The patient will be returned to her hospital bed. Ascencion Dike 11/07/2014 5:57 PM

## 2014-11-07 NOTE — H&P (Signed)
[facility] History and Physical  Andrea Lowery YCX:[id number] DOB: 11/24/1940 DOA: 11/06/2014  Referring physician: Ms.Dowless. PCP: Darden Amber, PA  Specialists: Dr.Hilty .Cardiology.  Chief Complaint: Headache.  HPI: Andrea Lowery is a 74 y.o. female history of diabetes mellitus, diastolic CHF, hypertension, hypothyroidism, MGUS presents to the ER because of headache. Patient has been having headaches off and on for last 3 months since July. Pain is mostly on the left temporal area constant. Yesterday patient states her pain worsened with some left-sided blurred vision which improved that time patient reached ER. Patient did not have any loss of function of upper or lower extremities. Patient states a headache versus on chewing. Denies any nausea vomiting tearing of the eye. CT of the head was unremarkable in the ER. Sedimentation rate was around 40. Since patient's headache was associated blurred vision temporal arteritis was under consideration and patient was given prednisone 60 mg and neurology consulted. Patient denies any palpitations chest pain shortness of breath any discharge from the nose mouth or eyes. Denies any neck rigidity. Denies any fever or chills.  Review of Systems: As presented in the history of presenting illness, rest negative.  Past Medical History  Diagnosis Date  . Asthma   . Hypertension   . OSA on CPAP   . Dyslipidemia   . Hypothyroidism   . LBBB (left bundle branch block)   . MGUS (monoclonal gammopathy of unknown significance)   . Diabetes mellitus without complication ([place])   . Arthritis   . MGUS (monoclonal gammopathy of unknown significance)    Past Surgical History  Procedure Laterality Date  . Cholecystectomy  1976  . Tonsillectomy  1965  . Abdominal hysterectomy  1969  . Plantar fascia release    . US echocardiography  09/20/2008    borderline LVH,mild TR,AOV mildly sclerotic w/ca+ of the leaflets  . Nm myoview ltd   02/13/2010    No ischemia   Social History:  reports that she quit smoking about 8 years ago. She has never used smokeless tobacco. She reports that she does not drink alcohol or use illicit drugs. Where does patient live home. Can patient participate in ADLs? Yes.  Allergies  Allergen Reactions  . Bee Venom Anaphylaxis    Yellow jackets  . Penicillins Hives    Family History:  Family History  Problem Relation Age of Onset  . Diabetes Father   . Heart failure Father   . Thyroid disease Sister       Prior to Admission medications   Medication Sig Start Date End Date Taking? Authorizing Provider  albuterol (PROVENTIL HFA;VENTOLIN HFA) 108 (90 BASE) MCG/ACT inhaler Inhale 2 puffs into the lungs every 4 (four) hours as needed for shortness of breath. For shortness of breath and wheezing   Yes Historical Provider, MD  beclomethasone (QVAR) 80 MCG/ACT inhaler Inhale 2 puffs into the lungs 2 (two) times daily.   Yes Historical Provider, MD  cetirizine (ZYRTEC) 10 MG tablet Take 10 mg by mouth daily.    Yes Historical Provider, MD  cycloSPORINE (RESTASIS) 0.05 % ophthalmic emulsion Place 1 drop into both eyes at bedtime.    Yes Historical Provider, MD  diclofenac sodium (VOLTAREN) 1 % GEL Apply 2 g topically daily as needed (Knee pain).    Yes Historical Provider, MD  EPINEPHrine 0.3 mg/0.3 mL IJ SOAJ injection Inject 0.3 mg into the muscle daily as needed (bee stings).    Yes Historical Provider, MD  esomeprazole (NEXIUM) 40 MG capsule  Take 40 mg by mouth 2 (two) times daily.    Yes Historical Provider, MD  eszopiclone (LUNESTA) 2 MG TABS tablet Take 1 tablet (2 mg total) by mouth at bedtime as needed for sleep. Take immediately before bedtime 04/15/14  Yes Troy Sine, MD  fluticasone [facility]) 50 MCG/ACT nasal spray Place 2 sprays into both nostrils as needed for allergies.  12/27/13  Yes Historical Provider, MD  furosemide (LASIX) 20 MG tablet Take 2 tablets (40 mg total) by mouth  daily. 01/13/14  Yes Pixie Casino, MD  levothyroxine (SYNTHROID, LEVOTHROID) 50 MCG tablet Take 1 tablet (50 mcg total) by mouth daily. 08/09/14  Yes Renato Shin, MD  metFORMIN (GLUCOPHAGE) 500 MG tablet Take 500 mg by mouth daily.   Yes Historical Provider, MD  metoprolol succinate (TOPROL XL) 25 MG 24 hr tablet Take 0.5 tablets (12.5 mg total) by mouth daily. Patient taking differently: Take 25 mg by mouth daily.  07/29/13  Yes Pixie Casino, MD  montelukast (SINGULAIR) 10 MG tablet Take 10 mg by mouth at bedtime.   Yes Historical Provider, MD  nystatin cream (MYCOSTATIN) Apply 1 application topically daily as needed. For irritation 10/31/14  Yes Historical Provider, MD  Olopatadine HCl (PATADAY) 0.2 % SOLN Apply 1 drop to eye daily as needed (Allergies).    Yes Historical Provider, MD  rosuvastatin (CRESTOR) 40 MG tablet Take 1 tablet (40 mg total) by mouth daily. 01/13/14  Yes Pixie Casino, MD  valsartan-hydrochlorothiazide (DIOVAN-HCT) 160-12.5 MG per tablet Take 1 tablet by mouth daily.   Yes Historical Provider, MD  acetaminophen-codeine (TYLENOL #3) 300-30 MG tablet Take 1 tablet by mouth 2 (two) times daily as needed for moderate pain. 11/04/14   Charlett Blake, MD  methocarbamol (ROBAXIN) 500 MG tablet Take 1 tablet (500 mg total) by mouth 3 (three) times daily. 09/13/14   Charlett Blake, MD    Physical Exam: Filed Vitals:   11/07/14 0100 11/07/14 0115 11/07/14 0130 11/07/14 0155  BP: 156/58 155/77 166/63 159/62  Pulse: 57 66 64 72  Temp:    98.2 F (36.8 C)  TempSrc:    Oral  Resp:    18  Height:    5\' 6"  (1.676 m)  Weight:    86.592 kg (190 lb 14.4 oz)  SpO2: 97% 96% 95% 97%     General:  Moderately built and nourished.  Eyes: Anicteric no pallor.  ENT: No discharge from the ears eyes nose or mouth.  Neck: No neck rigidity.  Cardiovascular: S1 and S2 heard.  Respiratory: No rhonchi or crepitations.  Abdomen: Soft nontender bowel sounds present.  Skin:  No rash.  Musculoskeletal: No edema.  Psychiatric: Appears normal.  Neurologic: Alert awake oriented to time place and person. Moves all extremities 5 x 5. No facial asymmetry. Tongue is midline.  Labs on Admission:  Basic Metabolic Panel:  Recent Labs Lab 11/06/14 1939  NA 139  K 3.1*  CL 101  CO2 28  GLUCOSE 129*  BUN 8  CREATININE 0.92  CALCIUM 9.5   Liver Function Tests: No results for input(s): AST, ALT, ALKPHOS, BILITOT, PROT, ALBUMIN in the last 168 hours. No results for input(s): LIPASE, AMYLASE in the last 168 hours. No results for input(s): AMMONIA in the last 168 hours. CBC:  Recent Labs Lab 11/06/14 1939  WBC 6.6  NEUTROABS 3.4  HGB 11.8*  HCT 35.3*  MCV 82.9  PLT 199   Cardiac Enzymes: No results for input(s): CKTOTAL,  CKMB, CKMBINDEX, TROPONINI in the last 168 hours.  BNP (last 3 results) No results for input(s): BNP in the last 8760 hours.  ProBNP (last 3 results) No results for input(s): PROBNP in the last 8760 hours.  CBG: No results for input(s): GLUCAP in the last 168 hours.  Radiological Exams on Admission: Ct Head Wo Contrast  11/06/2014   CLINICAL DATA:  Headache and LEFT jaw pain. Elevated blood pressure.  EXAM: CT HEAD WITHOUT CONTRAST  TECHNIQUE: Contiguous axial images were obtained from the base of the skull through the vertex without intravenous contrast.  COMPARISON:  06/30/2014.  FINDINGS: No evidence for acute infarction, hemorrhage, mass lesion, hydrocephalus, or extra-axial fluid. Normal for age cerebral volume. Mild white matter disease, nonspecific. Calvarium intact. No sinus or mastoid fluid. Negative orbits.  Within limits for visualization on noncontrast head CT, there is no TMJ dislocation. There may be mild degenerative change at the LEFT TMJ.  IMPRESSION: No acute intracranial abnormality. No change from most recent prior.   Electronically Signed   By: Staci Righter M.D.   On: 11/06/2014 21:41      Assessment/Plan Principal Problem:   Headache Active Problems:   Essential hypertension   OSA on CPAP   Diabetes mellitus type 2, controlled (HCC)   Diastolic CHF ([place])   1. Headache - differentials include temporal arteritis versus trigeminal neuralgia. Appreciate neurology consult. I have discussed with Dr. Janann Colonel. At this time patient will be continued on prednisone 60 mg by mouth daily and will need to consult ENT in a.m. for temporal artery biopsy. Continue with when necessary pain relieving medications. 2. Diabetes mellitus type 2 - hold metformin while inpatient with sliding scale coverage. 3. Chronic diastolic CHF last EF measured was 55-60% - appears compensated. Continue Lasix. 4. Hypertension - continue home medications. 5. Hypothyroidism with history of multinodular goiter on Synthroid. 6. Asthma - presently not wheezing. 7. MGUS and anemia being followed by oncologist.  I have reviewed patient's old charts some labs. Discussed with on-call neurologist.   DVT Prophylaxis SCDs in anticipation of procedure.  Code Status: Full code.  Family Communication: Discussed with patient.  Disposition Plan: Admit to inpatient.    Krishika Bugge N. [facility] Pager [phone].  If 7PM-7AM, please contact night-coverage [email or web address] Password [email or web address] 11/07/2014, 2:39 AM

## 2014-11-07 NOTE — ED Notes (Signed)
Attempted report x1. 

## 2014-11-08 ENCOUNTER — Encounter (HOSPITAL_COMMUNITY): Payer: Self-pay | Admitting: Otolaryngology

## 2014-11-08 DIAGNOSIS — G4733 Obstructive sleep apnea (adult) (pediatric): Secondary | ICD-10-CM

## 2014-11-08 DIAGNOSIS — I10 Essential (primary) hypertension: Secondary | ICD-10-CM

## 2014-11-08 LAB — BASIC METABOLIC PANEL
Anion gap: 6 (ref 5–15)
BUN: 9 mg/dL (ref 6–20)
CO2: 31 mmol/L (ref 22–32)
Calcium: 9 mg/dL (ref 8.9–10.3)
Chloride: 102 mmol/L (ref 101–111)
Creatinine, Ser: 0.91 mg/dL (ref 0.44–1.00)
GFR calc Af Amer: >60 mL/min (ref >60)
GFR calc non Af Amer: >60 mL/min (ref >60)
Glucose, Bld: 140 mg/dL — ABNORMAL HIGH (ref 65–99)
Potassium: 4 mmol/L (ref 3.5–5.1)
Sodium: 139 mmol/L (ref 135–145)

## 2014-11-08 LAB — MAGNESIUM: Magnesium: 2.6 mg/dL — ABNORMAL HIGH (ref 1.7–2.4)

## 2014-11-08 LAB — GLUCOSE, CAPILLARY: Glucose-Capillary: 111 mg/dL — ABNORMAL HIGH (ref 65–99)

## 2014-11-08 MED ORDER — PREDNISONE 20 MG PO TABS: 40.0000 mg | ORAL_TABLET | Freq: Every day | ORAL | Status: DC

## 2014-11-08 NOTE — Progress Notes (Signed)
Biopsy site c/d/i.  Expect biopsy result in 3-5 days.  Will sign off. Please call with questions or concerns.

## 2014-11-08 NOTE — Care Management Note (Signed)
Case Management Note  Patient Details  Name: BEE MARCHIANO MRN: [id number] Date of Birth: [birth year]  Subjective/Objective:                    Action/Plan:  Initial UR completed  Expected Discharge Date:                  Expected Discharge Plan:  Home/Self Care  In-House Referral:     Discharge planning Services     Post Acute Care Choice:    Choice offered to:     DME Arranged:    DME Agency:     HH Arranged:    [place] Agency:     Status of Service:  Completed, signed off  Medicare Important Message Given:    Date Medicare IM Given:    Medicare IM give by:    Date Additional Medicare IM Given:    Additional Medicare Important Message give by:     If discussed at [place] of Stay Meetings, dates discussed:    Additional Comments:  Marilu Favre, RN 11/08/2014, 11:32 AM

## 2014-11-08 NOTE — Discharge Summary (Signed)
Physician Discharge Summary  Andrea Lowery DIY:[id number] DOB: 1940-02-23 DOA: 11/06/2014  PCP: Darden Amber, PA  Admit date: 11/06/2014 Discharge date: 11/08/2014  Time spent: 35 minutes  Recommendations for Outpatient Follow-up:  1. FU with [facility] in 5-7 days to discuss results of temporal artery biopsy  2. Continue to FU w/ PCP for management of chronic conditions (DM2, CHF, HTN, HLD, Asthma)  3. Continue to FU w/ oncology for management of MGUS   Discharge Diagnoses:  Principal Problem:   Headache Active Problems:   Essential hypertension   OSA on CPAP   Diabetes mellitus type 2, controlled (HCC)   Diastolic CHF (HCC)  Principle Problem:  Headache with associated blurred vision  - Etiology unknown, questionable temporal arteritis vs. trigeminal neuralgia  - In ED, ESR 40, CRP WNL (.5), CT with no evidence of acute abnormalities  - Neurology consulted, recommended temporal artery biopsy  - ENT completed temporal artery biopsy on 11/07/14 - HA persists on d/c (4/10), blurred vision resolved  - Initiated oral prednisone 40 mg QD and PRN oral pain management, continue both on d/c, till the follow-up with neurology.  Active Problems:  Diabetes, Type 2  - Chronic, managed at home with Metformin  - Discontinued metformin and initiated carb-modified diet with sliding scale insulin on admission  - Resume Metformin on d/c   Hypokalemia - 3.1 on admission, etiology unknown, but appears to be chronic - Patient reports taking potassium supplements BID at home, unknown dose, not on home-med list  - Resolved to 4.0 with repletion  - Resume at home potassium supplementation on d/c  Diastolic CHF  - Chronic, Last EF was 55-60%, managed at home with Lasix and Metoprolol - Reported increase in peripheral swelling since admission, weights decreased from admission (88 kg to 86 kg), stable at 86 kg on discharge - Continue at home medications and monitor daily  weights on d/c   Essential Hypertension  - Chronic, managed at home with Diovan-HCT, Metoprolol  - Remained stable during hospitalization - Continue at home medications on d/c   Hypothyroidism - Chronic with a history of mutinodular goiter, controlled at home with Synthroid  - No complications during hospitalization  - Continue synthroid on d/c   Asthma - Chronic, controlled at home with Albuterol, QVAR, and Singulair  - No exacerbations during hospitalization  - Continue at home medications on d/c   Seasonal Allergies - Chronic, triggers of Asthma, managed at home with Flonase and Claritin - No exacerbations during hospitalization  - Continue at home medications on d/c   HLD - Chronic, managed at home with Crestor  - Continue at home medications on d/c   MGUS with anemia - Chronic, 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 She had a history of intermittent headaches x3 months. Pain was constant and confined to left temporal area. No N/V, photophobia, phonophobia, or ipsilateral tearing/lacrimation. No focal neurological deficits. PMH includes: DM2, diastolic CHF, HTN, Hypothyroidism, and MGUS.   In the ED, CT of  head unremarkable, ESR 40, and CRP WNL. Oral prednisone 60 mg QD initiated due to concern for temporal arteritis.   Hospital Course:  Patient continued to receive 60 mg oral prednisone during hospitalization. Neurology was consulted and recommended temporal artery biopsy. ENT was consulted and completed temporal artery biopsy on  11/07/14 with results anticipated in 3-5 days. Both headache and blurred vision improved during hospital course with PRN morphine. Blurred vision resolved on discharge. Headache remained on discharge (exacerbated with touch of biopsy site), but significantly improved (pain: 4/10). Patient discharged on oral pain medications and instructions to FU with neurology pending biopsy results.   Procedures:  Temporal artery biopsy (11/07/14)  Consultations:  Neurology   ENT   Discharge Exam: Filed Vitals:   11/08/14 1015  BP: 120/56  Pulse: 77  Temp: 98.4 F (36.9 C)  Resp: 16     General: Well-appearing woman, sitting in bed, no acute distress  Eyes: PERRLA, TTP on L temporal area  ENT: No discharge or tenderness, no cervical lymphadenopathy   Cardiovascular: RRR, no m/r/g. 1+ b/l LE edema   Respiratory: CTA b/l, no wheeze or crackles  Abdomen: Soft, non-tender, non-distended   Skin: No rashes or lesions  Neuro: A&Ox3, no focal neurological deficits  Discharge Instructions   Discharge Instructions    Diet - low sodium heart healthy    Complete by:  As directed      Increase activity slowly    Complete by:  As directed           Current Discharge Medication List    CONTINUE these medications which have NOT CHANGED   Details  albuterol (PROVENTIL HFA;VENTOLIN HFA) 108 (90 BASE) MCG/ACT inhaler Inhale 2 puffs into the lungs every 4 (four) hours as needed for shortness of breath. For shortness of breath and wheezing    beclomethasone (QVAR) 80 MCG/ACT inhaler Inhale 2 puffs into the lungs 2 (two) times daily.    cetirizine (ZYRTEC) 10 MG tablet Take 10 mg by mouth daily.     cycloSPORINE (RESTASIS) 0.05 % ophthalmic emulsion Place 1 drop into both eyes at bedtime.     diclofenac sodium (VOLTAREN) 1 % GEL Apply 2 g topically daily as needed (Knee pain).     EPINEPHrine 0.3 mg/0.3 mL IJ SOAJ injection Inject 0.3 mg into the muscle daily as needed (bee stings).      esomeprazole (NEXIUM) 40 MG capsule Take 40 mg by mouth 2 (two) times daily.     eszopiclone (LUNESTA) 2 MG TABS tablet Take 1 tablet (2 mg total) by mouth at bedtime as needed for sleep. Take immediately before bedtime Qty: 30 tablet, Refills: 1    fluticasone (FLONASE) 50 MCG/ACT nasal spray Place 2 sprays into both nostrils as needed for allergies.     furosemide (LASIX) 20 MG tablet Take 2 tablets (40 mg total) by mouth daily. Qty: 60 tablet, Refills: 6    levothyroxine (SYNTHROID, LEVOTHROID) 50 MCG tablet Take 1 tablet (50 mcg total) by mouth daily. Qty: 90 tablet, Refills: 3    metFORMIN (GLUCOPHAGE) 500 MG tablet Take 500 mg by mouth daily.    metoprolol succinate (TOPROL XL) 25 MG 24 hr tablet Take 0.5 tablets (12.5 mg total) by mouth daily. Qty: 15 tablet, Refills: 6    montelukast (SINGULAIR) 10 MG tablet Take 10 mg by mouth at bedtime.    nystatin cream (MYCOSTATIN) Apply 1 application topically daily as needed. For irritation Refills: 0    Olopatadine HCl (PATADAY) 0.2 %  SOLN Apply 1 drop to eye daily as needed (Allergies).     rosuvastatin (CRESTOR) 40 MG tablet Take 1 tablet (40 mg total) by mouth daily. Qty: 30 tablet, Refills: 6    valsartan-hydrochlorothiazide (DIOVAN-HCT) 160-12.5 MG per tablet Take 1 tablet by mouth daily.    acetaminophen-codeine (TYLENOL #3) 300-30 MG tablet Take 1 tablet by mouth 2 (two) times daily as needed for moderate pain. Qty: 60 tablet, Refills: 1    methocarbamol (ROBAXIN) 500 MG tablet Take 1 tablet (500 mg total) by mouth 3 (three) times daily. Qty: 45 tablet, Refills: 0   Associated Diagnoses: Bilateral leg edema       Allergies  Allergen Reactions  . Bee Venom Anaphylaxis    Yellow jackets  . Penicillins Hives   Follow-up Information    Follow up with Darden Amber, PA In 1 week.   Contact information:   [address] [place] [address] [phone]        The results of significant  diagnostics from this hospitalization (including imaging, microbiology, ancillary and laboratory) are listed below for reference.    Significant Diagnostic Studies: Ct Head Wo Contrast  11/06/2014   CLINICAL DATA:  Headache and LEFT jaw pain. Elevated blood pressure.  EXAM: CT HEAD WITHOUT CONTRAST  TECHNIQUE: Contiguous axial images were obtained from the base of the skull through the vertex without intravenous contrast.  COMPARISON:  06/30/2014.  FINDINGS: No evidence for acute infarction, hemorrhage, mass lesion, hydrocephalus, or extra-axial fluid. Normal for age cerebral volume. Mild white matter disease, nonspecific. Calvarium intact. No sinus or mastoid fluid. Negative orbits.  Within limits for visualization on noncontrast head CT, there is no TMJ dislocation. There may be mild degenerative change at the LEFT TMJ.  IMPRESSION: No acute intracranial abnormality. No change from most recent prior.   Electronically Signed   By: Staci Righter M.D.   On: 11/06/2014 21:41    Microbiology: Recent Results (from the past 240 hour(s))  Surgical pcr screen     Status: None   Collection Time: 11/07/14  2:35 PM  Result Value Ref Range Status   MRSA, PCR NEGATIVE NEGATIVE Final   Staphylococcus aureus NEGATIVE NEGATIVE Final    Comment:        The Xpert SA Assay (FDA approved for NASAL specimens in patients over 4 years of age), is one component of a comprehensive surveillance program.  Test performance has been validated by [facility] for patients greater than or equal to 49 year old. It is not intended to diagnose infection nor to guide or monitor treatment.      Labs: Basic Metabolic Panel:  Recent Labs Lab 11/06/14 1939 11/07/14 0619 11/08/14 0457  NA 139 136 139  K 3.1* 3.0* 4.0  CL 101 102 102  CO2 _0 GLUCOSE 129* 123* 140*  BUN _1 CREATININE 0.92 0.77 0.91  CALCIUM 9.5 8.8* 9.0  MG  --   --  2.6*   Liver Function Tests:  Recent Labs Lab 11/07/14 0619   AST 18  ALT 16  ALKPHOS 71  BILITOT 0.6  PROT 6.3*  ALBUMIN 3.1*   No results for input(s): LIPASE, AMYLASE in the last 168 hours. No results for input(s): AMMONIA in the last 168 hours. CBC:  Recent Labs Lab 11/06/14 1939 11/07/14 0619  WBC 6.6 4.8  NEUTROABS 3.4  --   HGB 11.8* 10.9*  HCT 35.3* 31.8*  MCV 82.9 82.8  PLT  199 187   Cardiac Enzymes: No results for input(s): CKTOTAL, CKMB, CKMBINDEX, TROPONINI in the last 168 hours. BNP: BNP (last 3 results) No results for input(s): BNP in the last 8760 hours.  ProBNP (last 3 results) No results for input(s): PROBNP in the last 8760 hours.  CBG:  Recent Labs Lab 11/07/14 1819  GLUCAP 111*       Signed:  Micayla Zeltman, Student-PA  [facility] 11/08/2014, 11:29 AM   Addendum  Patient seen and examined, chart and data base reviewed.  I agree with the above assessment and plan.  For full details please see Mrs. Micayla Zeltman, Student-PA note.  I reviewed and amended and the above note as appropriate.   Birdie Hopes, MD [facility] Pager: [phone] 11/08/2014, 2:19 PM

## 2014-11-08 NOTE — Anesthesia Postprocedure Evaluation (Signed)
  Anesthesia Post-op Note  Patient: Andrea Lowery  Procedure(s) Performed: Procedure(s): BIOPSY TEMPORAL ARTERY (N/A)  Patient Location: PACU  Anesthesia Type:General  Level of Consciousness: awake, alert  and oriented  Airway and Oxygen Therapy: Patient Spontanous Breathing  Post-op Pain: mild  Post-op Assessment: Post-op Vital signs reviewed, Patient's Cardiovascular Status Stable, Respiratory Function Stable, Patent Airway and Pain level controlled              Post-op Vital Signs: stable  Last Vitals:  Filed Vitals:   11/08/14 1015  BP: 120/56  Pulse: 77  Temp: 36.9 C  Resp: 16    Complications: No apparent anesthesia complications

## 2014-11-08 NOTE — Progress Notes (Signed)
Pt ready for discharge to home with sister. Pt. Is alert and oriented. Pt is hemodynamically stable. IV removed. AVS reviewed with pt. Capable of re verbalizing medication regimen. Discharge plan appropriate and in place.

## 2014-11-09 ENCOUNTER — Ambulatory Visit: Payer: Medicare HMO | Admitting: Endocrinology

## 2014-11-18 ENCOUNTER — Other Ambulatory Visit (INDEPENDENT_AMBULATORY_CARE_PROVIDER_SITE_OTHER): Payer: Medicare HMO

## 2014-11-18 ENCOUNTER — Encounter: Payer: Self-pay | Admitting: Neurology

## 2014-11-18 ENCOUNTER — Ambulatory Visit (INDEPENDENT_AMBULATORY_CARE_PROVIDER_SITE_OTHER): Payer: Medicare HMO | Admitting: Neurology

## 2014-11-18 VITALS — BP 150/80 | HR 74 | Ht 67.0 in | Wt 193.1 lb

## 2014-11-18 DIAGNOSIS — G43009 Migraine without aura, not intractable, without status migrainosus: Secondary | ICD-10-CM | POA: Diagnosis not present

## 2014-11-18 DIAGNOSIS — I10 Essential (primary) hypertension: Secondary | ICD-10-CM

## 2014-11-18 DIAGNOSIS — H538 Other visual disturbances: Secondary | ICD-10-CM

## 2014-11-18 DIAGNOSIS — R519 Headache, unspecified: Secondary | ICD-10-CM

## 2014-11-18 DIAGNOSIS — R6884 Jaw pain: Secondary | ICD-10-CM

## 2014-11-18 DIAGNOSIS — R51 Headache: Secondary | ICD-10-CM

## 2014-11-18 MED ORDER — GABAPENTIN 300 MG PO CAPS: 300.0000 mg | ORAL_CAPSULE | Freq: Three times a day (TID) | ORAL | Status: DC

## 2014-11-18 NOTE — Progress Notes (Signed)
Note routed

## 2014-11-18 NOTE — Addendum Note (Signed)
Addended by: Chester Holstein on: 11/18/2014 01:26 PM   Modules accepted: Orders

## 2014-11-18 NOTE — Progress Notes (Signed)
NEUROLOGY CONSULTATION NOTE  Andrea Lowery MRN: [id number] DOB: 11/09/1940  Referring provider: Corrin Parker Primary care provider: Roosevelt Locks, PA  Reason for consult:  Temporal arteritis  HISTORY OF PRESENT ILLNESS: Andrea Lowery is a 74 year old right-handed female with type 2 diabetes mellitus, diastolic CHF, OSA, hypertension, asthma, hyperlipidemia and MGUS who presents for temporal arteritis.  History obtained by patient and hospital records.  Labs, biopsy report, and images of head CT reviewed.  Patient was admitted to [facility] on 11/06/14 for worsening headache and left-sided blurred vision.  Since August, she has noticed blurred vision in the left eye only.  On one occasion, she was reading and briefly lost vision in the left eye.  She saw the ophthalmologist 3 times in August and September and she did not have any remarkable exam.  For the past couple of months, she reported left sided jaw pain that shoots up and associated with a throbbing left temporal headache.  She saw her dentist who found nothing wrong.  She does wear dentures and there didn't seem to be a problem with the fitting.  On 11/06/14, the jaw pain became so severe while eating, that she had to go to the ED.  CT of head was unremarkable.  Sed Rate was 40 and CRP 0.5.  She was started on prednisone 40mg  daily for suspected temporal arteritis.  She underwent temporal artery biopsy, which was negative.  She still has some blurred vision and left temporal headache, but both are not as severe as earlier this month.  She does have a history of left temporal migraines and previously was on topiramate 150mg  for a while.  Her jaw remains painful and tender.  It is difficulty for her to sing as well.  Since the temporal artery biopsy, she reports some numbness on the left side of her face as well.  PAST MEDICAL HISTORY: Past Medical History  Diagnosis Date  . Asthma   . Hypertension   . OSA on  CPAP   . Dyslipidemia   . Hypothyroidism   . LBBB (left bundle branch block)   . MGUS (monoclonal gammopathy of unknown significance)   . Diabetes mellitus without complication ([place])   . Arthritis   . MGUS (monoclonal gammopathy of unknown significance)     PAST SURGICAL HISTORY: Past Surgical History  Procedure Laterality Date  . Cholecystectomy  1976  . Tonsillectomy  1965  . Abdominal hysterectomy  1969  . Plantar fascia release    . US echocardiography  09/20/2008    borderline LVH,mild TR,AOV mildly sclerotic w/ca+ of the leaflets  . Nm myoview ltd  02/13/2010    No ischemia  . Artery biopsy N/A 11/07/2014    Procedure: BIOPSY TEMPORAL ARTERY;  Surgeon: Leta Baptist, MD;  Location: [facility] OR;  Service: ENT;  Laterality: N/A;    MEDICATIONS: Current Outpatient Prescriptions on File Prior to Visit  Medication Sig Dispense Refill  . acetaminophen-codeine (TYLENOL #3) 300-30 MG tablet Take 1 tablet by mouth 2 (two) times daily as needed for moderate pain. 60 tablet 1  . albuterol (PROVENTIL HFA;VENTOLIN HFA) 108 (90 BASE) MCG/ACT inhaler Inhale 2 puffs into the lungs every 4 (four) hours as needed for shortness of breath. For shortness of breath and wheezing    . beclomethasone (QVAR) 80 MCG/ACT inhaler Inhale 2 puffs into the lungs 2 (two) times daily.    . cetirizine (ZYRTEC) 10 MG tablet Take 10 mg by mouth daily.     Marland Kitchen  cycloSPORINE (RESTASIS) 0.05 % ophthalmic emulsion Place 1 drop into both eyes at bedtime.     . diclofenac sodium (VOLTAREN) 1 % GEL Apply 2 g topically daily as needed (Knee pain).     Marland Kitchen EPINEPHrine 0.3 mg/0.3 mL IJ SOAJ injection Inject 0.3 mg into the muscle daily as needed (bee stings).     Marland Kitchen esomeprazole (NEXIUM) 40 MG capsule Take 40 mg by mouth 2 (two) times daily.     . fluticasone (FLONASE) 50 MCG/ACT nasal spray Place 2 sprays into both nostrils as needed for allergies.     . furosemide (LASIX) 20 MG tablet Take 2 tablets (40 mg total) by mouth daily. 60 tablet  6  . levothyroxine (SYNTHROID, LEVOTHROID) 50 MCG tablet Take 1 tablet (50 mcg total) by mouth daily. 90 tablet 3  . metFORMIN (GLUCOPHAGE) 500 MG tablet Take 500 mg by mouth daily.    . methocarbamol (ROBAXIN) 500 MG tablet Take 1 tablet (500 mg total) by mouth 3 (three) times daily. 45 tablet 0  . metoprolol succinate (TOPROL XL) 25 MG 24 hr tablet Take 0.5 tablets (12.5 mg total) by mouth daily. (Patient taking differently: Take 25 mg by mouth daily. ) 15 tablet 6  . montelukast (SINGULAIR) 10 MG tablet Take 10 mg by mouth at bedtime.    Marland Kitchen nystatin cream (MYCOSTATIN) Apply 1 application topically daily as needed. For irritation  0  . Olopatadine HCl (PATADAY) 0.2 % SOLN Apply 1 drop to eye daily as needed (Allergies).     . predniSONE (DELTASONE) 20 MG tablet Take 2 tablets (40 mg total) by mouth daily with breakfast. 60 tablet 0  . valsartan-hydrochlorothiazide (DIOVAN-HCT) 160-12.5 MG per tablet Take 1 tablet by mouth daily.     No current facility-administered medications on file prior to visit.    ALLERGIES: Allergies  Allergen Reactions  . Bee Venom Anaphylaxis    Yellow jackets  . Penicillins Hives    FAMILY HISTORY: Family History  Problem Relation Age of Onset  . Diabetes Father   . Heart failure Father   . Thyroid disease Sister     SOCIAL HISTORY: Social History   Social History  . Marital Status: Divorced    Spouse Name: N/A  . Number of Children: N/A  . Years of Education: N/A   Occupational History  . Not on file.   Social History Main Topics  . Smoking status: Former Smoker    Quit date: 02/04/2006  . Smokeless tobacco: Never Used  . Alcohol Use: No  . Drug Use: No  . Sexual Activity: No   Other Topics Concern  . Not on file   Social History Narrative   Lives with sister in a one story home.  Has one child.  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      REVIEW OF SYSTEMS: Constitutional: No fevers, chills, or sweats, no generalized fatigue, change in  appetite Eyes: No visual changes, double vision, eye pain Ear, nose and throat: No hearing loss, ear pain, nasal congestion, sore throat Cardiovascular: No chest pain, palpitations Respiratory:  No shortness of breath at rest or with exertion, wheezes GastrointestinaI: No nausea, vomiting, diarrhea, abdominal pain, fecal incontinence Genitourinary:  No dysuria, urinary retention or frequency Musculoskeletal:  No neck pain, back pain.  Left jaw pain. Integumentary: No rash, pruritus, skin lesions Neurological: as above Psychiatric: No depression, insomnia, anxiety Endocrine: No palpitations, fatigue, diaphoresis, mood swings, change in appetite, change in weight, increased thirst Hematologic/Lymphatic:  No anemia, purpura, petechiae. Allergic/Immunologic: no  itchy/runny eyes, nasal congestion, recent allergic reactions, rashes  PHYSICAL EXAM: Filed Vitals:   11/18/14 1232  BP: 150/80  Pulse: 74   General: No acute distress.  Patient appears well-groomed. Head:  Normocephalic/atraumatic Eyes:  fundi unremarkable, without vessel changes, exudates, hemorrhages or papilledema. Neck: supple, no paraspinal tenderness, full range of motion Back: No paraspinal tenderness Heart: regular rate and rhythm Lungs: Clear to auscultation bilaterally. Vascular: No carotid bruits. Neurological Exam: Mental status: alert and oriented to person, place, and time, recent and remote memory intact, fund of knowledge intact, attention and concentration intact, speech fluent and not dysarthric, language intact. Cranial nerves: CN I: not tested CN II: pupils equal, round and reactive to light, visual fields intact, fundi unremarkable, without vessel changes, exudates, hemorrhages or papilledema. CN III, IV, VI:  full range of motion, no nystagmus, no ptosis CN V: facial sensation mildly reduced on left V1-V3 distribution CN VII: upper and lower face symmetric CN VIII: hearing intact CN IX, X: gag intact,  uvula midline CN XI: sternocleidomastoid and trapezius muscles intact CN XII: tongue midline Bulk & Tone: normal, no fasciculations. Motor:  5/5 throughout  Sensation: temperature and vibration sensation intact. Deep Tendon Reflexes:  2+ throughout, toes downgoing.  Finger to nose testing:  Without dysmetria.  Heel to shin:  Without dysmetria.  Gait:  Normal station and stride.  Able to turn.  Some difficulty with tandem walk. Romberg negative.  IMPRESSION: Left temporal headache and left sided jaw pain.  She does not have temporal arteritis.  Consider exacerbation of migraines (as her temporal headaches are characteristic), perhaps triggered by some pathology involving her jaw.  Trigeminal neuralgia is possible as well. Blurred vision in left eye with brief episode of vision loss.  History of migraine without aura Hypertension  PLAN: 1.  Will start gabapentin 300mg  three times daily.  She will call in one week with update. 2.  Will decrease prednisone to 20mg  daily for 3 days and then stop 3.  Check CTA of head and neck to evaluate for possible dissection in patient with unilateral head and jaw pain with unilateral vision loss. 4.  Will obtain notes from dentist.  May need to consider evaluation by a periodontist, given that palpation of the jaw and in her mouth are quite tender. 5.  Follow up in 4 weeks. 6.  Blood pressure should be rechecked with PCP  Thank you for allowing me to take part in the care of this patient.  Metta Clines, DO  CC:  [place], [place]

## 2014-11-18 NOTE — Addendum Note (Signed)
Addended by: Chester Holstein on: 11/18/2014 01:52 PM   Modules accepted: Orders

## 2014-11-18 NOTE — Patient Instructions (Signed)
1.  Start gabapentin 300mg  three times daily.  Call in one week with update 2.  We will stop the prednisone.  Take one pill daily for 3 days and then stop 3.  We will get carotid doppler  4.  Will get notes from dentist 5.  Follow up in 4 weeks.

## 2014-11-21 ENCOUNTER — Ambulatory Visit: Payer: Medicare HMO | Admitting: Neurology

## 2014-11-21 ENCOUNTER — Other Ambulatory Visit: Payer: Medicare HMO

## 2014-11-21 ENCOUNTER — Telehealth: Payer: Self-pay | Admitting: Neurology

## 2014-11-21 DIAGNOSIS — G43009 Migraine without aura, not intractable, without status migrainosus: Secondary | ICD-10-CM

## 2014-11-21 DIAGNOSIS — H538 Other visual disturbances: Secondary | ICD-10-CM

## 2014-11-21 DIAGNOSIS — R519 Headache, unspecified: Secondary | ICD-10-CM

## 2014-11-21 DIAGNOSIS — I10 Essential (primary) hypertension: Secondary | ICD-10-CM

## 2014-11-21 DIAGNOSIS — R6884 Jaw pain: Secondary | ICD-10-CM

## 2014-11-21 DIAGNOSIS — R51 Headache: Principal | ICD-10-CM

## 2014-11-21 LAB — BUN: BUN: 13 mg/dL (ref 6–23)

## 2014-11-21 LAB — CREATININE, SERUM: Creatinine, Ser: 1.02 mg/dL (ref 0.40–1.20)

## 2014-11-21 NOTE — Telephone Encounter (Signed)
PT called and said she went to [facility] to have her blood work done and they told her there was no order for them to be done and she did not have any paperwork/Dawn CB# [phone]

## 2014-11-21 NOTE — Telephone Encounter (Signed)
I spoke with patient. She was to supposed to go to Endocrinology to have her labs drawn instead she went to [facility]. She is going to come in today to have those done. I did explain to her that she is going to have to come up to our office 1st to be checked in and then go down to the Endocrinology on the 2nd floor to have her blood drawn.

## 2014-11-22 ENCOUNTER — Inpatient Hospital Stay (HOSPITAL_COMMUNITY): Admission: RE | Admit: 2014-11-22 | Payer: Medicare HMO | Source: Ambulatory Visit

## 2014-11-24 ENCOUNTER — Ambulatory Visit
Admission: RE | Admit: 2014-11-24 | Discharge: 2014-11-24 | Disposition: A | Discharge status: Home / Self Care | Payer: Medicare HMO | Source: Ambulatory Visit | Attending: Neurology | Admitting: Neurology

## 2014-11-24 DIAGNOSIS — H538 Other visual disturbances: Secondary | ICD-10-CM

## 2014-11-24 DIAGNOSIS — R51 Headache: Principal | ICD-10-CM

## 2014-11-24 DIAGNOSIS — R519 Headache, unspecified: Secondary | ICD-10-CM

## 2014-11-24 DIAGNOSIS — G43009 Migraine without aura, not intractable, without status migrainosus: Secondary | ICD-10-CM

## 2014-11-24 DIAGNOSIS — R6884 Jaw pain: Secondary | ICD-10-CM

## 2014-11-24 MED ORDER — IOPAMIDOL (ISOVUE-370) INJECTION 76%
100.0000 mL | Freq: Once | INTRAVENOUS | Status: AC | PRN
  Administered 2014-11-24: 100 mL via INTRAVENOUS

## 2014-11-25 ENCOUNTER — Telehealth: Payer: Self-pay

## 2014-11-25 NOTE — Telephone Encounter (Signed)
-----   Message from [place], DO sent at 11/25/2014 11:34 AM EDT ----- Dr. Tomi Likens can help her with that when he gets back.

## 2014-11-25 NOTE — Telephone Encounter (Signed)
Previous discussion from result notes:    Notes Recorded by Amada Kingfisher, CMA on 11/25/2014 at 8:41 AM Message relayed to patient. Pt is still concerned about why she is having pain. Would like to know what the next step in determining that will be. Please advise. Notes Recorded by Eustace Quail Tat, DO on 11/24/2014 at 4:26 PM You can let pt know that her CTA looked good

## 2014-11-28 ENCOUNTER — Telehealth: Payer: Self-pay

## 2014-11-28 NOTE — Telephone Encounter (Signed)
Opened in error

## 2014-11-28 NOTE — Telephone Encounter (Signed)
-----   Message from Pieter Partridge, DO sent at 11/28/2014  7:17 AM EDT ----- We can check MRI of the brain without contrast to evaluate for new-onset headache in patient over 50.  She should be aware that we look for secondary causes of headache, but may not necessarily find a specific cause.

## 2014-11-28 NOTE — Telephone Encounter (Signed)
Pt aware. For now she would like to hold off on MRI. Pt headaches severity has improved, but still experiencing daily headaches. Will give medication a few more weeks to work, and then will call in with new report.

## 2014-12-05 ENCOUNTER — Ambulatory Visit (HOSPITAL_COMMUNITY)
Admission: RE | Admit: 2014-12-05 | Discharge: 2014-12-05 | Disposition: A | Discharge status: Home / Self Care | Payer: Medicare HMO | Source: Ambulatory Visit | Attending: Cardiology | Admitting: Cardiology

## 2014-12-05 DIAGNOSIS — R519 Headache, unspecified: Secondary | ICD-10-CM

## 2014-12-05 DIAGNOSIS — G43009 Migraine without aura, not intractable, without status migrainosus: Secondary | ICD-10-CM

## 2014-12-05 DIAGNOSIS — I6523 Occlusion and stenosis of bilateral carotid arteries: Secondary | ICD-10-CM | POA: Insufficient documentation

## 2014-12-05 DIAGNOSIS — R51 Headache: Secondary | ICD-10-CM | POA: Diagnosis not present

## 2014-12-05 DIAGNOSIS — R6884 Jaw pain: Secondary | ICD-10-CM | POA: Diagnosis not present

## 2014-12-05 DIAGNOSIS — H538 Other visual disturbances: Secondary | ICD-10-CM | POA: Diagnosis not present

## 2014-12-06 ENCOUNTER — Telehealth: Payer: Self-pay

## 2014-12-06 NOTE — Telephone Encounter (Signed)
Patient aware. Has a cardiology appointment tomorrow with Dr. Debara Pickett. Will discuss with him.

## 2014-12-06 NOTE — Telephone Encounter (Signed)
-----   Message from Pieter Partridge, DO sent at 12/06/2014  2:00 PM EDT ----- Carotid doppler shows some plaque in the carotid arteries.  I would have her check with her PCP regarding possible treatment for high cholesterol.  I would recommend repeating carotid doppler in one year

## 2014-12-07 ENCOUNTER — Ambulatory Visit (INDEPENDENT_AMBULATORY_CARE_PROVIDER_SITE_OTHER): Payer: Medicare HMO | Admitting: Internal Medicine

## 2014-12-07 ENCOUNTER — Encounter: Payer: Self-pay | Admitting: Internal Medicine

## 2014-12-07 VITALS — BP 138/68 | HR 74 | Ht 67.0 in | Wt 193.4 lb

## 2014-12-07 DIAGNOSIS — G4733 Obstructive sleep apnea (adult) (pediatric): Secondary | ICD-10-CM | POA: Diagnosis not present

## 2014-12-07 DIAGNOSIS — I5031 Acute diastolic (congestive) heart failure: Secondary | ICD-10-CM

## 2014-12-07 DIAGNOSIS — I10 Essential (primary) hypertension: Secondary | ICD-10-CM

## 2014-12-07 DIAGNOSIS — I447 Left bundle-branch block, unspecified: Secondary | ICD-10-CM | POA: Diagnosis not present

## 2014-12-07 DIAGNOSIS — Z9989 Dependence on other enabling machines and devices: Secondary | ICD-10-CM

## 2014-12-07 MED ORDER — ROSUVASTATIN CALCIUM 40 MG PO TABS: 40.0000 mg | ORAL_TABLET | Freq: Every day | ORAL | Status: DC

## 2014-12-07 NOTE — Progress Notes (Signed)
OFFICE NOTE  Chief Complaint:  Hospital follow-up  Primary Care Physician: Darden Amber, [place]  HPI:  Andrea Lowery is a 74 year old female, history of some lower extremity edema, asthma, seasonal allergies, and a left bundle branch block. There was no evidence for ischemia based on stress testing in 2012. The main issue today is she was told recently that she was having worsening problems with snoring and witnessed episodes of apnea at night. She says her sleep is poor. She often feels fatigued throughout the day. Occasionally wakes up with headaches in the morning. Apparently, she underwent a sleep study in 2008 or so and that was negative; however, her weight was about 150 to 160 at the time. Since she has stopped smoking and gained about 40 pounds. I was concerned about sleep apnea and sent her for a sleep study which demonstrated significant sleep apnea. She was fitted with a mask and is doing well.  I have also readjusted her medications, switching her to Diovan HCTZ and decreasing her amlodipine, which has since been discontinued totally. She also recently underwent extensive dental work by Dr. Buelah Manis.  This included moderate sedation and was without complication.  She returns today feeling fairly well. EKG continues to show persistent left bundle branch block.  Andrea Lowery returns today for followup appointment. She was just seen by Cecilie Kicks, nurse practitioner, in our office for hospital followup. She presented to the hospital with shortness of breath and was told she was in congestive heart failure. A chest x-ray showed borderline cardiomegaly with early interstitial edema. A BNP was only mildly elevated at 170. She was told she had significant heart failure and there was concern about her persistent left bundle branch block which is well-documented and is nonischemic. She was discharged and in followup was given additional Lasix and lost 2 pounds. She reports her  breathing is back to normal although she still has persistent leg swelling. She is not good at reducing salt in her diet in fact eats a good amount of it. In addition, she is not on beta blocker.  A repeat echocardiogram was performed today, and demonstrated preserved systolic function with an EF of 55-60%, no significant valvular disease, and diastolic dysfunction.  I saw Andrea Lowery back today in the office. Her main complaint still is about ankle swelling. She has nonpitting edema mostly over the lateral malleolus bilaterally. Seems to be slightly worse in the right than the left. She denies any worsening shortness of breath or chest pain. She tells me that her primary care provider decrease her antihypertensive medications for unknown reasons, specifically valsartan was cut in half. She continues to have mild hypokalemia on low-dose repletion. She also was switched to pravastatin but is interested in going back to Crestor.  Andrea Lowery returns today for hospital follow-up. He was recently hospitalized and had mild diastolic heart failure exacerbation. In addition she was having headaches and thought to have possible temporal arteritis, however a temporal biopsy was negative. Subsequently she has been given additional medication for headaches. From a cardiology standpoint, she appears to be quite euvolemic on her current dose of diuretics, which had increased her to most recently. She reports some swelling around the right and left lateral malleoli, which does not appear to be edema.  PMHx:  Past Medical History  Diagnosis Date  . Asthma   . Hypertension   . OSA on CPAP   . Dyslipidemia   . Hypothyroidism   . LBBB (left bundle branch  block)   . MGUS (monoclonal gammopathy of unknown significance)   . Diabetes mellitus without complication ([place])   . Arthritis   . MGUS (monoclonal gammopathy of unknown significance)     Past Surgical History  Procedure Laterality Date  . Cholecystectomy   1976  . Tonsillectomy  1965  . Abdominal hysterectomy  1969  . Plantar fascia release    . US echocardiography  09/20/2008    borderline LVH,mild TR,AOV mildly sclerotic w/ca+ of the leaflets  . Nm myoview ltd  02/13/2010    No ischemia  . Artery biopsy N/A 11/07/2014    Procedure: BIOPSY TEMPORAL ARTERY;  Surgeon: Leta Baptist, MD;  Location: [facility] OR;  Service: ENT;  Laterality: N/A;    FAMHx:  Family History  Problem Relation Age of Onset  . Diabetes Father   . Heart failure Father   . Thyroid disease Sister     SOCHx:   reports that she quit smoking about 8 years ago. She has never used smokeless tobacco. She reports that she does not drink alcohol or use illicit drugs.  ALLERGIES:  Allergies  Allergen Reactions  . Bee Venom Anaphylaxis    Yellow jackets  . Penicillins Hives   ROS: A comprehensive review of systems was negative.  HOME MEDS: Current Outpatient Prescriptions  Medication Sig Dispense Refill  . acetaminophen-codeine (TYLENOL #3) 300-30 MG tablet Take 1 tablet by mouth 2 (two) times daily as needed for moderate pain. 60 tablet 1  . albuterol (PROVENTIL HFA;VENTOLIN HFA) 108 (90 BASE) MCG/ACT inhaler Inhale 2 puffs into the lungs every 4 (four) hours as needed for shortness of breath. For shortness of breath and wheezing    . ALPRAZolam (XANAX) 0.25 MG tablet Take 0.25 mg by mouth.    . beclomethasone (QVAR) 80 MCG/ACT inhaler Inhale 2 puffs into the lungs 2 (two) times daily.    . cetirizine (ZYRTEC) 10 MG tablet Take 10 mg by mouth daily.     . cycloSPORINE (RESTASIS) 0.05 % ophthalmic emulsion Place 1 drop into both eyes at bedtime.     . diclofenac sodium (VOLTAREN) 1 % GEL Apply 2 g topically daily as needed (Knee pain).     Marland Kitchen EPINEPHrine 0.3 mg/0.3 mL IJ SOAJ injection Inject 0.3 mg into the muscle daily as needed (bee stings).     Marland Kitchen esomeprazole (NEXIUM) 40 MG capsule Take 40 mg by mouth 2 (two) times daily.     . fluticasone (FLONASE) 50 MCG/ACT nasal spray  Place 2 sprays into both nostrils as needed for allergies.     . furosemide (LASIX) 20 MG tablet Take 2 tablets (40 mg total) by mouth daily. 60 tablet 6  . gabapentin (NEURONTIN) 300 MG capsule Take 1 capsule (300 mg total) by mouth 3 (three) times daily. 90 capsule 2  . Influenza Vac Split High-Dose (FLUZONE HIGH-DOSE) 0.5 ML SUSY inject 0.5 milliliter intramuscularly    . levothyroxine (SYNTHROID, LEVOTHROID) 50 MCG tablet Take 1 tablet (50 mcg total) by mouth daily. 90 tablet 3  . metFORMIN (GLUCOPHAGE) 500 MG tablet Take 500 mg by mouth daily.    . methocarbamol (ROBAXIN) 500 MG tablet Take 1 tablet (500 mg total) by mouth 3 (three) times daily. 45 tablet 0  . metoprolol succinate (TOPROL XL) 25 MG 24 hr tablet Take 0.5 tablets (12.5 mg total) by mouth daily. (Patient taking differently: Take 25 mg by mouth daily. ) 15 tablet 6  . montelukast (SINGULAIR) 10 MG tablet Take 10 mg by  mouth at bedtime.    Marland Kitchen nystatin cream (MYCOSTATIN) Apply 1 application topically daily as needed. For irritation  0  . Olopatadine HCl (PATADAY) 0.2 % SOLN Apply 1 drop to eye daily as needed (Allergies).     . potassium chloride SA (K-DUR,KLOR-CON) 20 MEQ tablet     . predniSONE (DELTASONE) 20 MG tablet Take 2 tablets (40 mg total) by mouth daily with breakfast. 60 tablet 0  . valsartan-hydrochlorothiazide (DIOVAN-HCT) 160-12.5 MG per tablet Take 1 tablet by mouth daily.    . rosuvastatin (CRESTOR) 40 MG tablet Take 1 tablet (40 mg total) by mouth daily. 90 tablet 3   No current facility-administered medications for this visit.    LABS/IMAGING: No results found for this or any previous visit (from the past 48 hour(s)). No results found.  VITALS: BP 138/68 mmHg  Pulse 74  Ht 5\' 7"  (1.702 m)  Wt 193 lb 6.4 oz (87.726 kg)  BMI 30.28 kg/m2  EXAM: General appearance: alert and no distress Neck: no adenopathy, no carotid bruit, no JVD, supple, symmetrical, trachea midline and thyroid not enlarged, symmetric,  no tenderness/mass/nodules Lungs: clear to auscultation bilaterally Heart: regular rate and rhythm, S1, S2 normal and systolic murmur: early systolic 2/6, crescendo at 2nd left intercostal space Abdomen: soft, non-tender; bowel sounds normal; no masses,  no organomegaly Extremities: extremities normal, atraumatic, no cyanosis or edema and Fatty deposition over both lateral malleolus Pulses: 2+ and symmetric Skin: Skin color, texture, turgor normal. No rashes or lesions Neurologic: Grossly normal  EKG: Normal sinus rhythm at 74, left bundle branch block  ASSESSMENT: 1. Left bundle branch block 2. Hypertension-controlled 3. Mild dyspnea on exertion 4. Obstructive sleep apnea on CPAP 5. Diastolic dysfunction with recent mild acute congestive heart failure, EF 55-60% 6. Moderate bilateral carotid artery disease  PLAN: 1.   Andrea Lowery is doing fairly well. She is still complaining of some leg swelling, but I do not appreciate pitting edema on exam. There is fatty deposition over both lateral malleoli which may be more cosmetic than functional. She seems to want to seek out a orthopedist to have this evaluated. I would like her to stay on her current dose of Lasix. She never went on Crestor 40 mg daily, but recently was diagnosed with carotid artery disease. I recommend that she start taking the statin and will prescribe it today. She will need repeat carotid Dopplers in one year. Plan to see her back in 6 months.  Pixie Casino, MD, [facility] Attending Cardiologist [place] 12/07/2014, 1:13 PM

## 2014-12-07 NOTE — Patient Instructions (Addendum)
Dr. Debara Pickett has prescribed CRESTOR 40 MG to take once daily for cholesterol   Your physician wants you to follow-up in: 6 months with Dr. Debara Pickett. You will receive a reminder letter in the mail two months in advance. If you don't receive a letter, please call our office to schedule the follow-up appointment.

## 2014-12-20 ENCOUNTER — Ambulatory Visit (INDEPENDENT_AMBULATORY_CARE_PROVIDER_SITE_OTHER): Payer: Medicare HMO

## 2014-12-20 ENCOUNTER — Telehealth: Payer: Self-pay | Admitting: Allergy and Immunology

## 2014-12-20 ENCOUNTER — Ambulatory Visit (INDEPENDENT_AMBULATORY_CARE_PROVIDER_SITE_OTHER): Payer: Medicare HMO | Admitting: Allergy and Immunology

## 2014-12-20 ENCOUNTER — Encounter: Payer: Self-pay | Admitting: Allergy and Immunology

## 2014-12-20 VITALS — BP 114/72 | HR 64 | Resp 12

## 2014-12-20 DIAGNOSIS — J4540 Moderate persistent asthma, uncomplicated: Secondary | ICD-10-CM | POA: Diagnosis not present

## 2014-12-20 DIAGNOSIS — J3089 Other allergic rhinitis: Secondary | ICD-10-CM | POA: Diagnosis not present

## 2014-12-20 DIAGNOSIS — K219 Gastro-esophageal reflux disease without esophagitis: Secondary | ICD-10-CM

## 2014-12-20 DIAGNOSIS — T63441D Toxic effect of venom of bees, accidental (unintentional), subsequent encounter: Secondary | ICD-10-CM | POA: Diagnosis not present

## 2014-12-20 NOTE — Patient Instructions (Addendum)
  1. Continue Dulera 200 2 inhalations + Qvar 80 2 inhalations two times per day  2. Continue Fluticasone 1-2 sprays each nostril two times per day  3. Continue montelukast 10mg  one tablet one time per day  4. Increase Nexium 40 to twice a day  5. Continue Proair HFA, Xyzal, Pataday if needed.  6. Return in 6 months  7. Restart immunotherapy for venoms and aeroallergens

## 2014-12-20 NOTE — Progress Notes (Signed)
[facility]  Follow-up Note  Refering Provider: Darden Amber, [place] Primary Provider: Darden Amber, [place]  Subjective:   Andrea Lowery is a 74 y.o. female who returns to the [facility] in re-evaluation of the following:  HPI Comments:  Capree returns to this clinic in reevaluation of her asthma on allergic rhinitis and history of chronic sinusitis and reflux-induced respiratory disease. She believes that her asthma is doing relatively well while using a combination of Dulera and Qvar and she rarely uses any short acting bronchodilator. She believes that her nose is doing okay with nasal fluticasone and Singulair although she does have some nasal congestion upon awakening in the morning. She has not performed any house dust avoidance measures. Her reflux is not under good control with Nexium 40 mg once a day and she still has some burning and regurgitation. She did much better in the past when using Nexium 40 mg twice a day. There was an issue of whether or not Nexium was giving rise to bone ache but that appears to not be an issue at this point. She would like to go back up to Nexium 40 mg twice a day. Recently she did require systemic steroids for what was believed to be temporal arteritis but apparently after receiving a temporal artery biopsy it was determined that she did not have temporal arteritis. She's been using immunotherapy for her aero allergen allergy and venom hypersensitivity.   Outpatient Encounter Prescriptions as of 12/20/2014  Medication Sig  . acetaminophen-codeine (TYLENOL #3) 300-30 MG tablet Take 1 tablet by mouth 2 (two) times daily as needed for moderate pain.  Marland Kitchen albuterol (PROVENTIL HFA;VENTOLIN HFA) 108 (90 BASE) MCG/ACT inhaler Inhale 2 puffs into the lungs every 4 (four) hours as needed for shortness of breath. For shortness of breath and wheezing  . beclomethasone (QVAR) 80 MCG/ACT  inhaler Inhale 2 puffs into the lungs 2 (two) times daily.  . cetirizine (ZYRTEC) 10 MG tablet Take 10 mg by mouth daily.   . cycloSPORINE (RESTASIS) 0.05 % ophthalmic emulsion Place 1 drop into both eyes at bedtime.   . diclofenac sodium (VOLTAREN) 1 % GEL Apply 2 g topically daily as needed (Knee pain).   Marland Kitchen EPINEPHrine 0.3 mg/0.3 mL IJ SOAJ injection Inject 0.3 mg into the muscle daily as needed (bee stings).   Marland Kitchen esomeprazole (NEXIUM) 40 MG capsule Take 40 mg by mouth 2 (two) times daily.   . fluticasone (FLONASE) 50 MCG/ACT nasal spray Place 2 sprays into both nostrils as needed for allergies.   . Influenza Vac Split High-Dose (FLUZONE HIGH-DOSE) 0.5 ML SUSY inject 0.5 milliliter intramuscularly  . levothyroxine (SYNTHROID, LEVOTHROID) 50 MCG tablet Take 1 tablet (50 mcg total) by mouth daily.  . metFORMIN (GLUCOPHAGE) 500 MG tablet Take 500 mg by mouth daily.  . metoprolol succinate (TOPROL XL) 25 MG 24 hr tablet Take 0.5 tablets (12.5 mg total) by mouth daily. (Patient taking differently: Take 25 mg by mouth daily. )  . montelukast (SINGULAIR) 10 MG tablet Take 10 mg by mouth at bedtime.  . Olopatadine HCl (PATADAY) 0.2 % SOLN Apply 1 drop to eye daily as needed (Allergies).   . potassium chloride SA (K-DUR,KLOR-CON) 20 MEQ tablet   . rosuvastatin (CRESTOR) 40 MG tablet Take 1 tablet (40 mg total) by mouth daily.  . valsartan-hydrochlorothiazide (DIOVAN-HCT) 160-12.5 MG per tablet Take 1 tablet by mouth daily.  Marland Kitchen ALPRAZolam (XANAX) 0.25 MG  tablet Take 0.25 mg by mouth.  . furosemide (LASIX) 20 MG tablet Take 2 tablets (40 mg total) by mouth daily. (Patient not taking: Reported on 12/20/2014)  . gabapentin (NEURONTIN) 300 MG capsule Take 1 capsule (300 mg total) by mouth 3 (three) times daily. (Patient not taking: Reported on 12/20/2014)  . methocarbamol (ROBAXIN) 500 MG tablet Take 1 tablet (500 mg total) by mouth 3 (three) times daily. (Patient not taking: Reported on 12/20/2014)  .  nystatin cream (MYCOSTATIN) Apply 1 application topically daily as needed. For irritation  . predniSONE (DELTASONE) 20 MG tablet Take 2 tablets (40 mg total) by mouth daily with breakfast. (Patient not taking: Reported on 12/20/2014)   No facility-administered encounter medications on file as of 12/20/2014.    No orders of the defined types were placed in this encounter.    Past Medical History  Diagnosis Date  . Asthma   . Hypertension   . OSA on CPAP   . Dyslipidemia   . Hypothyroidism   . LBBB (left bundle branch block)   . MGUS (monoclonal gammopathy of unknown significance)   . Diabetes mellitus without complication ([place])   . Arthritis   . MGUS (monoclonal gammopathy of unknown significance)     Past Surgical History  Procedure Laterality Date  . Cholecystectomy  1976  . Tonsillectomy  1965  . Abdominal hysterectomy  1969  . Plantar fascia release    . US echocardiography  09/20/2008    borderline LVH,mild TR,AOV mildly sclerotic w/ca+ of the leaflets  . Nm myoview ltd  02/13/2010    No ischemia  . Artery biopsy N/A 11/07/2014    Procedure: BIOPSY TEMPORAL ARTERY;  Surgeon: Leta Baptist, MD;  Location: [facility] OR;  Service: ENT;  Laterality: N/A;    Allergies  Allergen Reactions  . Bee Venom Anaphylaxis    Yellow jackets  . Penicillins Hives    Review of Systems  Constitutional: Negative for fever and chills.  HENT: Negative for congestion, ear pain, facial swelling, nosebleeds, postnasal drip, rhinorrhea, sinus pressure, sneezing, sore throat, tinnitus, trouble swallowing and voice change.   Eyes: Negative for pain, discharge, redness and itching.  Respiratory: Negative for cough, choking, chest tightness, shortness of breath, wheezing and stridor.   Cardiovascular: Negative for chest pain and leg swelling.  Gastrointestinal: Positive for nausea. Negative for vomiting and abdominal pain.       Gastroesophageal reflux disease not well controlled with Nexium one time per day   Endocrine: Negative for cold intolerance and heat intolerance.  Genitourinary: Negative for difficulty urinating.  Musculoskeletal: Negative for myalgias and arthralgias.  Allergic/Immunologic: Negative.   Neurological: Negative for dizziness.  Hematological: Negative for adenopathy.     Objective:   Filed Vitals:   12/20/14 1548  BP: 114/72  Pulse: 64  Resp: 12          Physical Exam  Constitutional: She appears well-developed and well-nourished. No distress.  HENT:  Head: Normocephalic and atraumatic. Head is without right periorbital erythema and without left periorbital erythema.  Right Ear: Tympanic membrane, external ear and ear canal normal. No drainage or tenderness. No foreign bodies. Tympanic membrane is not injected, not scarred, not perforated, not erythematous, not retracted and not bulging. No middle ear effusion.  Left Ear: Tympanic membrane, external ear and ear canal normal. No drainage or tenderness. No foreign bodies. Tympanic membrane is not injected, not scarred, not perforated, not erythematous, not retracted and not bulging.  No middle ear effusion.  Nose: Nose  normal. No mucosal edema, rhinorrhea, nose lacerations or sinus tenderness.  No foreign bodies.  Mouth/Throat: Oropharynx is clear and moist. No oropharyngeal exudate, posterior oropharyngeal edema, posterior oropharyngeal erythema or tonsillar abscesses.  Eyes: Lids are normal. Right eye exhibits no chemosis, no discharge and no exudate. No foreign body present in the right eye. Left eye exhibits no chemosis, no discharge and no exudate. No foreign body present in the left eye. Right conjunctiva is not injected. Left conjunctiva is not injected.  Neck: Neck supple. No tracheal tenderness present. No tracheal deviation and no edema present. No thyroid mass and no thyromegaly present.  Cardiovascular: Normal rate, regular rhythm, S1 normal and S2 normal.  Exam reveals no gallop.   No murmur  heard. Pulmonary/Chest: No accessory muscle usage or stridor. No respiratory distress. She has no wheezes. She has no rhonchi. She has no rales.  Abdominal: Soft.  Lymphadenopathy:       Head (right side): No tonsillar adenopathy present.       Head (left side): No tonsillar adenopathy present.    She has no cervical adenopathy.  Neurological: She is alert.  Skin: No rash noted. She is not diaphoretic.  Psychiatric: She has a normal mood and affect. Her behavior is normal.    Diagnostics:    Spirometry was not performed secondary to her recent surgery  The patient had an Asthma Control Test with the following results: ACT Total Score: 13.    Assessment and Plan:   1. Other allergic rhinitis   2. Asthma, moderate persistent, uncomplicated   3. Gastroesophageal reflux disease, esophagitis presence not specified      1. Continue Dulera 200 2 inhalations + Qvar 80 2 inhalations two times per day  2. Continue Fluticasone 1-2 sprays each nostril two times per day  3. Continue montelukast 10mg  one tablet one time per day  4. Increase Nexium 40 to twice a day  5. Continue Proair HFA, Xyzal, Pataday if needed.  6. Return in 6 months  7. Restart immunotherapy for venoms and aeroallergens and continue EpiPen  Overall Angelica is doing relatively well and is the only manipulation we need to make at this point in time is to increase her Nexium to twice a day. We'll continue her on relatively high-dose inhaled steroid using a combination of Dulera and Qvar which is working very well over the course of the past 6 months. She will remain on immunotherapy directed against aeroallergens and venom at this point in time.   Allena Katz, MD [place]

## 2014-12-22 ENCOUNTER — Ambulatory Visit (INDEPENDENT_AMBULATORY_CARE_PROVIDER_SITE_OTHER): Payer: Medicare HMO

## 2014-12-22 DIAGNOSIS — J3089 Other allergic rhinitis: Secondary | ICD-10-CM | POA: Diagnosis not present

## 2014-12-23 ENCOUNTER — Encounter: Payer: Self-pay | Admitting: Physical Medicine & Rehabilitation

## 2014-12-23 ENCOUNTER — Ambulatory Visit (HOSPITAL_BASED_OUTPATIENT_CLINIC_OR_DEPARTMENT_OTHER): Payer: Medicare HMO | Admitting: Physical Medicine & Rehabilitation

## 2014-12-23 ENCOUNTER — Encounter: Payer: Medicare HMO | Attending: Physical Medicine & Rehabilitation

## 2014-12-23 VITALS — BP 156/83 | HR 68

## 2014-12-23 DIAGNOSIS — M47816 Spondylosis without myelopathy or radiculopathy, lumbar region: Secondary | ICD-10-CM | POA: Diagnosis not present

## 2014-12-23 DIAGNOSIS — M7061 Trochanteric bursitis, right hip: Secondary | ICD-10-CM | POA: Diagnosis not present

## 2014-12-23 DIAGNOSIS — Z5181 Encounter for therapeutic drug level monitoring: Secondary | ICD-10-CM | POA: Diagnosis not present

## 2014-12-23 DIAGNOSIS — Z79899 Other long term (current) drug therapy: Secondary | ICD-10-CM | POA: Diagnosis not present

## 2014-12-23 DIAGNOSIS — M533 Sacrococcygeal disorders, not elsewhere classified: Secondary | ICD-10-CM | POA: Diagnosis not present

## 2014-12-23 DIAGNOSIS — G894 Chronic pain syndrome: Secondary | ICD-10-CM | POA: Insufficient documentation

## 2014-12-23 MED ORDER — ACETAMINOPHEN-CODEINE #3 300-30 MG PO TABS: 1.0000 | ORAL_TABLET | Freq: Two times a day (BID) | ORAL | Status: DC | PRN

## 2014-12-23 NOTE — Progress Notes (Signed)
Subjective:    Patient ID: Andrea Lowery, female    DOB: 04-01-1940, 74 y.o.   MRN: [id number]  HPI Back pain mainly on the right side. Had sacral iliac injections in July and August 2016 on the right side with improvements in the buttocks pain. In September was complaining more of right-sided lateral hip pain. Trochanteric bursa injection was performed at that time with improvements in the lateral hip pain. Currently patient has very little pain in her back or hip area. Interval history is positive for development of left-sided pain in the TMJ area. She states that chewing makes the pain worse. She has seen her dentist and was advised to take ibuprofen. She has not seen oral surgeon. She is wondering whether some type of injection might be helpful for that. Still using Tylenol with Codeine Pain Inventory Average Pain 2 Pain Right Now 2 My pain is constant and sharp  In the last 24 hours, has pain interfered with the following? General activity 2 Relation with others 2 Enjoyment of life 2 What TIME of day is your pain at its worst? morning Sleep (in general) Fair  Pain is worse with: walking, inactivity and standing Pain improves with: rest and medication Relief from Meds: 7  Mobility walk without assistance how many minutes can you walk? 5 ability to climb steps?  yes  Function not employed: date last employed na  Neuro/Psych dizziness  Prior Studies Any changes since last visit?  yes CT/MRI  Physicians involved in your care Primary care .   Family History  Problem Relation Age of Onset  . Diabetes Father   . Heart failure Father   . Thyroid disease Sister    Social History   Social History  . Marital Status: Divorced    Spouse Name: N/A  . Number of Children: N/A  . Years of Education: N/A   Social History Main Topics  . Smoking status: Former Smoker    Quit date: 02/04/2006  . Smokeless tobacco: Never Used  . Alcohol Use: No  . Drug Use: No  .  Sexual Activity: No   Other Topics Concern  . None   Social History Narrative   Lives with sister in a one story home.  Has one child.  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     Past Surgical History  Procedure Laterality Date  . Cholecystectomy  1976  . Tonsillectomy  1965  . Abdominal hysterectomy  1969  . Plantar fascia release    . US echocardiography  09/20/2008    borderline LVH,mild TR,AOV mildly sclerotic w/ca+ of the leaflets  . Nm myoview ltd  02/13/2010    No ischemia  . Artery biopsy N/A 11/07/2014    Procedure: BIOPSY TEMPORAL ARTERY;  Surgeon: Leta Baptist, MD;  Location: [facility] OR;  Service: ENT;  Laterality: N/A;   Past Medical History  Diagnosis Date  . Asthma   . Hypertension   . OSA on CPAP   . Dyslipidemia   . Hypothyroidism   . LBBB (left bundle branch block)   . MGUS (monoclonal gammopathy of unknown significance)   . Diabetes mellitus without complication ([place])   . Arthritis   . MGUS (monoclonal gammopathy of unknown significance)    BP 156/83 mmHg  Pulse 68  SpO2 100%  Opioid Risk Score:   Fall Risk Score:  `1  Depression screen PHQ 2/9  Depression screen [facility] 2/9 08/25/2014 07/29/2014 09/28/2013  Decreased Interest 0 0 0  Down, Depressed, Hopeless 0 1 0  PHQ - 2 Score 0 1 0  Altered sleeping 1 1 -  Tired, decreased energy 2 1 -  Change in appetite 2 1 -  Feeling bad or failure about yourself  1 1 -  Trouble concentrating 1 1 -  Moving slowly or fidgety/restless 1 0 -  Suicidal thoughts 0 0 -  PHQ-9 Score 8 6 -      Review of Systems  Neurological: Positive for dizziness.  All other systems reviewed and are negative.      Objective:   Physical Exam  Constitutional: She is oriented to person, place, and time. She appears well-developed and well-nourished.  HENT:  Head: Normocephalic and atraumatic.  Eyes: Conjunctivae and EOM are normal. Pupils are equal, round, and reactive to light.  Neck: Normal range of motion.  Musculoskeletal:       Right hip: She  exhibits normal range of motion and no bony tenderness.       Lumbar back: She exhibits normal range of motion, no tenderness, no deformity and no spasm.  No tenderness to palpation over the right greater trochanter no tenderness palpation over the lumbar paraspinal muscles  Neurological: She is alert and oriented to person, place, and time.  Psychiatric: She has a normal mood and affect.  Nursing note and vitals reviewed.         Assessment & Plan:  1. Right sacroiliac dysfunction improved after injections 2. Have asked patient to remain physically active. 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 Right trochanteric bursitis improved after injection as discussed with the patient recommend physical activity [facility] [employer] [facility]  Chronic pain syndrome multifactorial continue Tylenol No. 3 one by mouth twice a day   We discussed that I cannot predict the duration of response to the injections.She will see me in 3 months since she is on a schedule 3 narcotic however if she needs an injection prior to that time she will call the office

## 2014-12-23 NOTE — Patient Instructions (Signed)
We will see you in 3 months however if the hip pain comes back call to schedule a trochanteric bursa injection If the buttocks pain comes back call to schedule a sacroiliac injection

## 2014-12-27 NOTE — Telephone Encounter (Signed)
x

## 2015-01-05 ENCOUNTER — Ambulatory Visit (INDEPENDENT_AMBULATORY_CARE_PROVIDER_SITE_OTHER): Payer: Medicare HMO

## 2015-01-05 DIAGNOSIS — J309 Allergic rhinitis, unspecified: Secondary | ICD-10-CM | POA: Diagnosis not present

## 2015-01-12 ENCOUNTER — Ambulatory Visit (INDEPENDENT_AMBULATORY_CARE_PROVIDER_SITE_OTHER): Payer: Medicare HMO

## 2015-01-12 DIAGNOSIS — J309 Allergic rhinitis, unspecified: Secondary | ICD-10-CM | POA: Diagnosis not present

## 2015-01-16 ENCOUNTER — Ambulatory Visit (INDEPENDENT_AMBULATORY_CARE_PROVIDER_SITE_OTHER): Payer: Medicare HMO | Admitting: Neurology

## 2015-01-16 DIAGNOSIS — J309 Allergic rhinitis, unspecified: Secondary | ICD-10-CM | POA: Diagnosis not present

## 2015-01-20 ENCOUNTER — Telehealth: Payer: Self-pay | Admitting: Physical Medicine & Rehabilitation

## 2015-01-20 NOTE — Telephone Encounter (Signed)
Is at the right buttocks pain over the right hip pain. She may need a repeat injection on that area. If this her TMJ pain she'll need to follow up with her dentist.

## 2015-01-20 NOTE — Telephone Encounter (Signed)
Would recommend doubling up on her Tylenol 3 I can do an injection on her in January please schedule a right sacroiliac injection

## 2015-01-20 NOTE — Telephone Encounter (Addendum)
It is in her buttocks and hip.  She is having difficulty getting out of bed because of it.  She is asking if you can send her something to the pharmacy prior to getting another injection.  Her next appt is 03/21/15 for f/u.

## 2015-01-20 NOTE — Telephone Encounter (Signed)
Patient in a lot of pain and states medication that was given to her isn't working.  Please let her know what she can do.

## 2015-01-20 NOTE — Telephone Encounter (Signed)
Please advise, thanks.

## 2015-01-23 ENCOUNTER — Other Ambulatory Visit: Payer: Self-pay | Admitting: Internal Medicine

## 2015-01-23 NOTE — Addendum Note (Signed)
Addended by: Caro Hight on: 01/23/2015 08:29 AM   Modules accepted: Medications

## 2015-01-23 NOTE — Telephone Encounter (Signed)
REFILL 

## 2015-01-23 NOTE — Telephone Encounter (Addendum)
Notified Mardene Celeste.  She will try the Tylenol #3 (2) bid and call back about scheduling the injection.

## 2015-01-25 ENCOUNTER — Ambulatory Visit (INDEPENDENT_AMBULATORY_CARE_PROVIDER_SITE_OTHER): Payer: Medicare HMO

## 2015-01-25 DIAGNOSIS — J309 Allergic rhinitis, unspecified: Secondary | ICD-10-CM | POA: Diagnosis not present

## 2015-01-26 ENCOUNTER — Ambulatory Visit: Payer: Medicare HMO | Admitting: Neurology

## 2015-01-27 ENCOUNTER — Ambulatory Visit: Payer: Medicare HMO | Admitting: Neurology

## 2015-02-02 ENCOUNTER — Ambulatory Visit (INDEPENDENT_AMBULATORY_CARE_PROVIDER_SITE_OTHER): Payer: Medicare HMO

## 2015-02-02 DIAGNOSIS — J309 Allergic rhinitis, unspecified: Secondary | ICD-10-CM

## 2015-03-15 ENCOUNTER — Ambulatory Visit (INDEPENDENT_AMBULATORY_CARE_PROVIDER_SITE_OTHER): Payer: Medicare HMO

## 2015-03-15 DIAGNOSIS — T63441D Toxic effect of venom of bees, accidental (unintentional), subsequent encounter: Secondary | ICD-10-CM

## 2015-03-21 ENCOUNTER — Encounter: Payer: Medicare HMO | Attending: Physical Medicine & Rehabilitation

## 2015-03-21 ENCOUNTER — Ambulatory Visit: Payer: Medicare HMO | Admitting: Physical Medicine & Rehabilitation

## 2015-03-21 DIAGNOSIS — M533 Sacrococcygeal disorders, not elsewhere classified: Secondary | ICD-10-CM | POA: Insufficient documentation

## 2015-03-21 DIAGNOSIS — Z5181 Encounter for therapeutic drug level monitoring: Secondary | ICD-10-CM | POA: Insufficient documentation

## 2015-03-21 DIAGNOSIS — M47816 Spondylosis without myelopathy or radiculopathy, lumbar region: Secondary | ICD-10-CM | POA: Insufficient documentation

## 2015-03-21 DIAGNOSIS — G894 Chronic pain syndrome: Secondary | ICD-10-CM | POA: Insufficient documentation

## 2015-03-21 DIAGNOSIS — Z79899 Other long term (current) drug therapy: Secondary | ICD-10-CM | POA: Insufficient documentation

## 2015-03-24 ENCOUNTER — Emergency Department (HOSPITAL_COMMUNITY)
Admission: EM | Admit: 2015-03-24 | Discharge: 2015-03-24 | Disposition: A | Discharge status: Home / Self Care | Payer: Medicare HMO | Attending: Emergency Medicine | Admitting: Emergency Medicine

## 2015-03-24 ENCOUNTER — Ambulatory Visit (INDEPENDENT_AMBULATORY_CARE_PROVIDER_SITE_OTHER): Payer: Medicare HMO | Admitting: Neurology

## 2015-03-24 ENCOUNTER — Encounter (HOSPITAL_COMMUNITY): Payer: Self-pay | Admitting: Family Medicine

## 2015-03-24 ENCOUNTER — Emergency Department (HOSPITAL_COMMUNITY): Payer: Medicare HMO

## 2015-03-24 DIAGNOSIS — G8929 Other chronic pain: Secondary | ICD-10-CM | POA: Insufficient documentation

## 2015-03-24 DIAGNOSIS — M1990 Unspecified osteoarthritis, unspecified site: Secondary | ICD-10-CM | POA: Diagnosis not present

## 2015-03-24 DIAGNOSIS — E785 Hyperlipidemia, unspecified: Secondary | ICD-10-CM | POA: Insufficient documentation

## 2015-03-24 DIAGNOSIS — M25551 Pain in right hip: Secondary | ICD-10-CM | POA: Diagnosis not present

## 2015-03-24 DIAGNOSIS — Z880 Allergy status to penicillin: Secondary | ICD-10-CM | POA: Insufficient documentation

## 2015-03-24 DIAGNOSIS — G4733 Obstructive sleep apnea (adult) (pediatric): Secondary | ICD-10-CM | POA: Insufficient documentation

## 2015-03-24 DIAGNOSIS — J45909 Unspecified asthma, uncomplicated: Secondary | ICD-10-CM | POA: Diagnosis not present

## 2015-03-24 DIAGNOSIS — E039 Hypothyroidism, unspecified: Secondary | ICD-10-CM | POA: Insufficient documentation

## 2015-03-24 DIAGNOSIS — Z9981 Dependence on supplemental oxygen: Secondary | ICD-10-CM | POA: Insufficient documentation

## 2015-03-24 DIAGNOSIS — E119 Type 2 diabetes mellitus without complications: Secondary | ICD-10-CM | POA: Insufficient documentation

## 2015-03-24 DIAGNOSIS — Z7984 Long term (current) use of oral hypoglycemic drugs: Secondary | ICD-10-CM | POA: Insufficient documentation

## 2015-03-24 DIAGNOSIS — Z79899 Other long term (current) drug therapy: Secondary | ICD-10-CM | POA: Insufficient documentation

## 2015-03-24 DIAGNOSIS — Z7951 Long term (current) use of inhaled steroids: Secondary | ICD-10-CM | POA: Insufficient documentation

## 2015-03-24 DIAGNOSIS — Z862 Personal history of diseases of the blood and blood-forming organs and certain disorders involving the immune mechanism: Secondary | ICD-10-CM | POA: Insufficient documentation

## 2015-03-24 DIAGNOSIS — M545 Low back pain: Secondary | ICD-10-CM | POA: Diagnosis present

## 2015-03-24 DIAGNOSIS — M533 Sacrococcygeal disorders, not elsewhere classified: Secondary | ICD-10-CM

## 2015-03-24 DIAGNOSIS — J309 Allergic rhinitis, unspecified: Secondary | ICD-10-CM

## 2015-03-24 DIAGNOSIS — I10 Essential (primary) hypertension: Secondary | ICD-10-CM | POA: Diagnosis not present

## 2015-03-24 DIAGNOSIS — Z87891 Personal history of nicotine dependence: Secondary | ICD-10-CM | POA: Diagnosis not present

## 2015-03-24 MED ORDER — ACETAMINOPHEN 325 MG PO TABS
650.0000 mg | ORAL_TABLET | ORAL | Status: AC
  Administered 2015-03-24: 650 mg via ORAL
  Filled 2015-03-24: qty 2

## 2015-03-24 NOTE — ED Provider Notes (Signed)
CSN: [id number]     Arrival date & time 03/24/15  1712 History  By signing my name below, I, [facility], attest that this documentation has been prepared under the direction and in the presence of [employer], PA-C. Electronically Signed: Virgel Bouquet, ED Scribe. 03/24/2015. 6:55 PM.   Chief Complaint  Patient presents with  . Back Pain   The history is provided by the patient. No language interpreter was used.   HPI Comments: DEREON THRELKELD is a 75 y.o. female with an hx of chronic back pain who presents to the Emergency Department complaining of constant, moderate, right-sided lower back pain that radiates down to the back of her right knee onset today that woke her from her sleep. She reports the pain is located over the sacroiliac joint and right lateral hip. No decreased range of motion, distal sensation or strength deficits. No saddle anesthesia, or back pain red flags. She notes that she has chronic flare-ups of her pain that usually last a few days before resolving without treatment. Pain worsens with movement and ambulation. She has taken Tramadol and applied heat to the area with heating pads and hot showers without relief. Per patient, she was seen for similar symptoms in the past at a pain management clinic where she received sacroiliac injections that did not relieve her pain. She was also seen at [facility] 4 months ago for similar pain. She denies weakness and paraesthesia.   Past Medical History  Diagnosis Date  . Asthma   . Hypertension   . OSA on CPAP   . Dyslipidemia   . Hypothyroidism   . LBBB (left bundle branch block)   . MGUS (monoclonal gammopathy of unknown significance)   . Diabetes mellitus without complication ([place])   . Arthritis   . MGUS (monoclonal gammopathy of unknown significance)    Past Surgical History  Procedure Laterality Date  . Cholecystectomy  1976  . Tonsillectomy  1965  . Abdominal hysterectomy  1969  . Plantar fascia  release    . US echocardiography  09/20/2008    borderline LVH,mild TR,AOV mildly sclerotic w/ca+ of the leaflets  . Nm myoview ltd  02/13/2010    No ischemia  . Artery biopsy N/A 11/07/2014    Procedure: BIOPSY TEMPORAL ARTERY;  Surgeon: Leta Baptist, MD;  Location: [facility] OR;  Service: ENT;  Laterality: N/A;   Family History  Problem Relation Age of Onset  . Diabetes Father   . Heart failure Father   . Thyroid disease Sister    Social History  Substance Use Topics  . Smoking status: Former Smoker    Quit date: 02/04/2006  . Smokeless tobacco: Never Used  . Alcohol Use: No   OB History    No data available     Review of Systems A complete 10 system review of systems was obtained and all systems are negative except as noted in the HPI and PMH.    Allergies  Bee venom and Penicillins  Home Medications   Prior to Admission medications   Medication Sig Start Date End Date Taking? Authorizing Provider  acetaminophen-codeine (TYLENOL #3) 300-30 MG tablet Take 1 tablet by mouth 2 (two) times daily as needed for moderate pain. 12/23/14   Charlett Blake, MD  albuterol (PROVENTIL HFA;VENTOLIN HFA) 108 (90 BASE) MCG/ACT inhaler Inhale 2 puffs into the lungs every 4 (four) hours as needed for shortness of breath. For shortness of breath and wheezing    Historical Provider, MD  ALPRAZolam (XANAX) 0.25 MG tablet Take 0.25 mg by mouth. 11/15/14   Historical Provider, MD  beclomethasone (QVAR) 80 MCG/ACT inhaler Inhale 2 puffs into the lungs 2 (two) times daily.    Historical Provider, MD  cetirizine (ZYRTEC) 10 MG tablet Take 10 mg by mouth daily.     Historical Provider, MD  cycloSPORINE (RESTASIS) 0.05 % ophthalmic emulsion Place 1 drop into both eyes at bedtime.     Historical Provider, MD  diclofenac sodium (VOLTAREN) 1 % GEL Apply 2 g topically daily as needed (Knee pain).     Historical Provider, MD  EPINEPHrine 0.3 mg/0.3 mL IJ SOAJ injection Inject 0.3 mg into the muscle daily as needed  (bee stings).     Historical Provider, MD  esomeprazole (NEXIUM) 40 MG capsule Take 40 mg by mouth 2 (two) times daily.     Historical Provider, MD  fluticasone (FLONASE) 50 MCG/ACT nasal spray Place 2 sprays into both nostrils as needed for allergies.  12/27/13   Historical Provider, MD  furosemide (LASIX) 20 MG tablet TAKE 2 TABLETS BY MOUTH ONCE DAILY 01/23/15   Pixie Casino, MD  gabapentin (NEURONTIN) 300 MG capsule Take 1 capsule (300 mg total) by mouth 3 (three) times daily. Patient not taking: Reported on 12/20/2014 11/18/14   Pieter Partridge, DO  Influenza Vac Split High-Dose (FLUZONE HIGH-DOSE) 0.5 ML SUSY inject 0.5 milliliter intramuscularly 11/03/14   Historical Provider, MD  levothyroxine (SYNTHROID, LEVOTHROID) 50 MCG tablet Take 1 tablet (50 mcg total) by mouth daily. 08/09/14   Renato Shin, MD  metFORMIN (GLUCOPHAGE) 500 MG tablet Take 500 mg by mouth daily.    Historical Provider, MD  methocarbamol (ROBAXIN) 500 MG tablet Take 1 tablet (500 mg total) by mouth 3 (three) times daily. Patient not taking: Reported on 12/20/2014 09/13/14   Charlett Blake, MD  metoprolol succinate (TOPROL XL) 25 MG 24 hr tablet Take 0.5 tablets (12.5 mg total) by mouth daily. Patient taking differently: Take 25 mg by mouth daily.  07/29/13   Pixie Casino, MD  montelukast (SINGULAIR) 10 MG tablet Take 10 mg by mouth at bedtime.    Historical Provider, MD  Olopatadine HCl (PATADAY) 0.2 % SOLN Apply 1 drop to eye daily as needed (Allergies).     Historical Provider, MD  potassium chloride SA (K-DUR,KLOR-CON) 20 MEQ tablet  10/21/14   Historical Provider, MD  rosuvastatin (CRESTOR) 40 MG tablet Take 1 tablet (40 mg total) by mouth daily. 12/07/14   Pixie Casino, MD  valsartan-hydrochlorothiazide (DIOVAN-HCT) 160-12.5 MG per tablet Take 1 tablet by mouth daily.    Historical Provider, MD   BP 141/68 mmHg  Pulse 77  Temp(Src) 97.8 F (36.6 C)  Resp 18  SpO2 97%   Physical Exam  Constitutional: She  is oriented to person, place, and time. She appears well-developed and well-nourished. No distress.  HENT:  Head: Normocephalic.  Neck: Normal range of motion. Neck supple.  Pulmonary/Chest: Effort normal.  Abdominal: Soft. She exhibits no distension and no mass. There is no tenderness. There is no rebound and no guarding.  Musculoskeletal: Normal range of motion. She exhibits tenderness. She exhibits no edema.  No C, T, or L spine tenderness to palpation. No obvious signs of trauma, deformity, infection, step-offs. Lung expansion normal. No scoliosis or kyphosis. Bilateral lower extremity strength 5 out of 5, sensation grossly intact, patellar reflexes 2+, pedal pulses 2+, Refill less than 3 seconds.  Tenderness to palpation of the right sacroiliac region,  tenderness to palpation of the right superior lateral hip   Straight leg negative Ambulates with antalgic gait   Neurological: She is alert and oriented to person, place, and time.  Skin: Skin is warm and dry. She is not diaphoretic.  Psychiatric: She has a normal mood and affect. Her behavior is normal. Judgment and thought content normal.  Nursing note and vitals reviewed.   ED Course  Procedures   DIAGNOSTIC STUDIES: Oxygen Saturation is 97% on RA, normal by my interpretation.    COORDINATION OF CARE: 5:55 PM Discussed treatment plan with pt at bedside and pt agreed to plan.  Labs Review Labs Reviewed - No data to display  Imaging Review Dg Lumbar Spine Complete  03/24/2015  CLINICAL DATA:  75 year old female with right hip and low back pain. No known injury. EXAM: LUMBAR SPINE - COMPLETE 4+ VIEW COMPARISON:  Radiograph dated 09/05/2013 FINDINGS: There is no acute fracture or subluxation. The vertebral body heights and disc spaces are maintained. There is mild degenerative changes of the lower lumbar spine. There is mild osteopenia. Right upper quadrant surgical clips noted. IMPRESSION: No acute lumbar spine pathology.  Electronically Signed   By: Anner Crete M.D.   On: 03/24/2015 18:38   Dg Hip Unilat With Pelvis 2-3 Views Right  03/24/2015  CLINICAL DATA:  75 year old female with right hip pain. No known injury. EXAM: DG HIP (WITH OR WITHOUT PELVIS) 2-3V RIGHT COMPARISON:  None. FINDINGS: There is no acute fracture or dislocation. The bones are osteopenic. There is mild degenerative changes of the lower lumbar spine. The soft tissues are grossly unremarkable. IMPRESSION: No acute fracture or dislocation. Electronically Signed   By: Anner Crete M.D.   On: 03/24/2015 18:35   I have personally reviewed and evaluated these images and lab results as part of my medical decision-making.   EKG Interpretation None      MDM   Final diagnoses:  SI (sacroiliac) pain  Hip pain, right    Labs:  Imaging: DG right hip, DG lumbar- no acute findings  Consults:  Therapeutics:  Discharge Meds:   Assessment/Plan: 75 year old female presents today with right lower back and hip pain. This is chronic in nature, patient has acute flares of this. She has no red flags for back pain, no neurological deficits, she ambulates with an antalgic gait but no difficulty. Patient has tried numerous medications at home from different providers with no significant improvement in her symptoms. She is seen pain management for this with unsuccessful intervention. Patient has an acute flare of her chronic low back pain, she will be referred to orthopedist for further evaluation and management. She is instructed to continue using at home medications previously prescribed for her back pain. Patient will be given strict return precautions, she verbalized understanding and agreement to today's plan and had no further questions or concerns at time of discharge.   I personally performed the services described in this documentation, which was scribed in my presence. The recorded information has been reviewed and is accurate.  Okey Regal, PA-C 03/24/15 1855  Okey Regal, PA-C 03/24/15 [id number]  Virgel Manifold, MD 03/25/15 [phone]

## 2015-03-24 NOTE — ED Notes (Signed)
Pt here for right lower back pain radiating down right leg.

## 2015-03-24 NOTE — Discharge Instructions (Signed)
Please use previously prescribed medication, please follow-up with orthopedic specialist for further evaluation and management.

## 2015-03-24 NOTE — ED Notes (Signed)
Patient transported to X-ray 

## 2015-03-29 DIAGNOSIS — T63441A Toxic effect of venom of bees, accidental (unintentional), initial encounter: Secondary | ICD-10-CM | POA: Diagnosis not present

## 2015-03-30 DIAGNOSIS — T63441A Toxic effect of venom of bees, accidental (unintentional), initial encounter: Secondary | ICD-10-CM | POA: Diagnosis not present

## 2015-03-31 ENCOUNTER — Ambulatory Visit (INDEPENDENT_AMBULATORY_CARE_PROVIDER_SITE_OTHER): Payer: Medicare HMO

## 2015-03-31 DIAGNOSIS — J309 Allergic rhinitis, unspecified: Secondary | ICD-10-CM | POA: Diagnosis not present

## 2015-03-31 DIAGNOSIS — M5441 Lumbago with sciatica, right side: Secondary | ICD-10-CM | POA: Insufficient documentation

## 2015-03-31 DIAGNOSIS — G8929 Other chronic pain: Secondary | ICD-10-CM | POA: Insufficient documentation

## 2015-04-05 DIAGNOSIS — J3089 Other allergic rhinitis: Secondary | ICD-10-CM | POA: Diagnosis not present

## 2015-04-06 DIAGNOSIS — J301 Allergic rhinitis due to pollen: Secondary | ICD-10-CM | POA: Diagnosis not present

## 2015-04-07 ENCOUNTER — Ambulatory Visit (INDEPENDENT_AMBULATORY_CARE_PROVIDER_SITE_OTHER): Payer: Medicare HMO | Admitting: *Deleted

## 2015-04-07 DIAGNOSIS — J309 Allergic rhinitis, unspecified: Secondary | ICD-10-CM | POA: Diagnosis not present

## 2015-04-13 ENCOUNTER — Ambulatory Visit (INDEPENDENT_AMBULATORY_CARE_PROVIDER_SITE_OTHER): Payer: Medicare HMO

## 2015-04-13 DIAGNOSIS — J309 Allergic rhinitis, unspecified: Secondary | ICD-10-CM

## 2015-04-19 ENCOUNTER — Telehealth: Payer: Self-pay | Admitting: Hematology and Oncology

## 2015-04-19 NOTE — Telephone Encounter (Signed)
returned call and lvm confirming appts... °

## 2015-04-20 ENCOUNTER — Ambulatory Visit (INDEPENDENT_AMBULATORY_CARE_PROVIDER_SITE_OTHER): Payer: Medicare HMO

## 2015-04-20 DIAGNOSIS — J309 Allergic rhinitis, unspecified: Secondary | ICD-10-CM

## 2015-04-25 ENCOUNTER — Other Ambulatory Visit (HOSPITAL_BASED_OUTPATIENT_CLINIC_OR_DEPARTMENT_OTHER): Payer: Medicare HMO

## 2015-04-25 ENCOUNTER — Ambulatory Visit (HOSPITAL_COMMUNITY)
Admission: RE | Admit: 2015-04-25 | Discharge: 2015-04-25 | Disposition: A | Discharge status: Home / Self Care | Payer: Medicare HMO | Source: Ambulatory Visit | Attending: Hematology and Oncology | Admitting: Hematology and Oncology

## 2015-04-25 DIAGNOSIS — D472 Monoclonal gammopathy: Secondary | ICD-10-CM | POA: Diagnosis not present

## 2015-04-25 DIAGNOSIS — M8588 Other specified disorders of bone density and structure, other site: Secondary | ICD-10-CM | POA: Insufficient documentation

## 2015-04-25 LAB — COMPREHENSIVE METABOLIC PANEL
ALT: 28 U/L (ref 0–55)
AST: 16 U/L (ref 5–34)
Albumin: 3.8 g/dL (ref 3.5–5.0)
Alkaline Phosphatase: 86 U/L (ref 40–150)
Anion Gap: 6 mEq/L (ref 3–11)
BUN: 10.3 mg/dL (ref 7.0–26.0)
CO2: 31 mEq/L — ABNORMAL HIGH (ref 22–29)
Calcium: 9.3 mg/dL (ref 8.4–10.4)
Chloride: 102 mEq/L (ref 98–109)
Creatinine: 0.9 mg/dL (ref 0.6–1.1)
EGFR: 70 mL/min/{1.73_m2} — ABNORMAL LOW (ref >90)
Glucose: 99 mg/dl (ref 70–140)
Potassium: 3.7 mEq/L (ref 3.5–5.1)
Sodium: 140 mEq/L (ref 136–145)
Total Bilirubin: 0.36 mg/dL (ref 0.20–1.20)
Total Protein: 7.9 g/dL (ref 6.4–8.3)

## 2015-04-25 LAB — LACTATE DEHYDROGENASE: LDH: 213 U/L (ref 125–245)

## 2015-04-25 LAB — CBC WITH DIFFERENTIAL/PLATELET
BASO%: 0.7 % (ref 0.0–2.0)
Basophils Absolute: 0 10*3/uL (ref 0.0–0.1)
EOS%: 5.2 % (ref 0.0–7.0)
Eosinophils Absolute: 0.2 10*3/uL (ref 0.0–0.5)
HCT: 36 % (ref 34.8–46.6)
HGB: 12.1 g/dL (ref 11.6–15.9)
LYMPH%: 40 % (ref 14.0–49.7)
MCH: 27 pg (ref 25.1–34.0)
MCHC: 33.6 g/dL (ref 31.5–36.0)
MCV: 80.4 fL (ref 79.5–101.0)
MONO#: 0.5 10*3/uL (ref 0.1–0.9)
MONO%: 11.3 % (ref 0.0–14.0)
NEUT#: 1.9 10*3/uL (ref 1.5–6.5)
NEUT%: 42.8 % (ref 38.4–76.8)
Platelets: 188 10*3/uL (ref 145–400)
RBC: 4.48 10*6/uL (ref 3.70–5.45)
RDW: 15.7 % — ABNORMAL HIGH (ref 11.2–14.5)
WBC: 4.4 10*3/uL (ref 3.9–10.3)
lymph#: 1.8 10*3/uL (ref 0.9–3.3)

## 2015-04-26 LAB — BETA 2 MICROGLOBULIN, SERUM: Beta-2: 1.6 mg/L (ref 0.6–2.4)

## 2015-04-26 LAB — KAPPA/LAMBDA LIGHT CHAINS
Ig Kappa Free Light Chain: 88.52 mg/L — ABNORMAL HIGH (ref 3.30–19.40)
Ig Lambda Free Light Chain: 12.23 mg/L (ref 5.71–26.30)
Kappa/Lambda FluidC Ratio: 7.24 — ABNORMAL HIGH (ref 0.26–1.65)

## 2015-04-27 ENCOUNTER — Ambulatory Visit (INDEPENDENT_AMBULATORY_CARE_PROVIDER_SITE_OTHER): Payer: Medicare HMO

## 2015-04-27 DIAGNOSIS — T63441D Toxic effect of venom of bees, accidental (unintentional), subsequent encounter: Secondary | ICD-10-CM

## 2015-04-28 LAB — MULTIPLE MYELOMA PANEL, SERUM
Albumin SerPl Elph-Mcnc: 3.6 g/dL (ref 2.9–4.4)
Albumin/Glob SerPl: 1 (ref 0.7–1.7)
Alpha 1: 0.2 g/dL (ref 0.0–0.4)
Alpha2 Glob SerPl Elph-Mcnc: 0.8 g/dL (ref 0.4–1.0)
B-Globulin SerPl Elph-Mcnc: 1.1 g/dL (ref 0.7–1.3)
Gamma Glob SerPl Elph-Mcnc: 1.8 g/dL (ref 0.4–1.8)
Globulin, Total: 3.8 g/dL (ref 2.2–3.9)
IgA, Qn, Serum: 81 mg/dL (ref 64–422)
IgG, Qn, Serum: 1661 mg/dL — ABNORMAL HIGH (ref 700–1600)
IgM, Qn, Serum: 28 mg/dL (ref 26–217)
M Protein SerPl Elph-Mcnc: 1.3 g/dL — ABNORMAL HIGH
Total Protein: 7.4 g/dL (ref 6.0–8.5)

## 2015-05-02 ENCOUNTER — Encounter: Payer: Self-pay | Admitting: Hematology and Oncology

## 2015-05-02 ENCOUNTER — Encounter: Payer: Self-pay | Admitting: *Deleted

## 2015-05-02 ENCOUNTER — Telehealth: Payer: Self-pay | Admitting: Hematology and Oncology

## 2015-05-02 ENCOUNTER — Ambulatory Visit (HOSPITAL_BASED_OUTPATIENT_CLINIC_OR_DEPARTMENT_OTHER): Payer: Medicare HMO | Admitting: Hematology and Oncology

## 2015-05-02 VITALS — BP 145/63 | HR 87 | Temp 97.6°F | Resp 18 | Ht 67.0 in | Wt 197.1 lb

## 2015-05-02 DIAGNOSIS — M25551 Pain in right hip: Secondary | ICD-10-CM | POA: Insufficient documentation

## 2015-05-02 DIAGNOSIS — D472 Monoclonal gammopathy: Secondary | ICD-10-CM

## 2015-05-02 DIAGNOSIS — G8929 Other chronic pain: Secondary | ICD-10-CM | POA: Diagnosis not present

## 2015-05-02 NOTE — Progress Notes (Signed)
[facility] OFFICE PROGRESS NOTE  Patient Care Team: Darden Amber, PA as PCP - General  SUMMARY OF ONCOLOGIC HISTORY:  I reviewed the patient's records extensive and collaborated the history with the patient. Summary of her history is as follows: She has a long-standing history of multiple arthralgias, left CMC pain, OA of the bilateral knees, hands and feet and osteoporosis for which she is on Prolia previously. She had SPEP drawn on 02/19/2013 that revealed a restricted band with monoclonal protein present, IgG kappa subtype. The monoclonal protein peak accounted for 1.38 g/dL of the total 1.66 g/dL of protein in the gamma region. Her CMP demonstrated a creatinine of 0.81 and calcium of 9.0 and total protein of 7.2 and albumin of 3.9; Her CBC was within normal limits with a hemoglobin of 12.5. Skeletal x-ray show no evidence of lytic lesions. She had a bone marrow biopsy on 06/22/2013 which show 10% of plasma cells. She was observed.  INTERVAL HISTORY: Please see below for problem oriented charting. She returns for further follow-up. She continues to have chronic right hip pain that comes and goes with activities. Occasionally, it may wake her up at night. The pain is localized in the right hip radiating down to the right leg. She has been referred to pain clinic in the past but has not seen physical therapist. She denies recent infection.  REVIEW OF SYSTEMS:   Constitutional: Denies fevers, chills or abnormal weight loss Eyes: Denies blurriness of vision Ears, nose, mouth, throat, and face: Denies mucositis or sore throat Respiratory: Denies cough, dyspnea or wheezes Cardiovascular: Denies palpitation, chest discomfort or lower extremity swelling Gastrointestinal:  Denies nausea, heartburn or change in bowel habits Skin: Denies abnormal skin rashes Lymphatics: Denies new lymphadenopathy or easy bruising Neurological:Denies numbness, tingling or new  weaknesses Behavioral/Psych: Mood is stable, no new changes  All other systems were reviewed with the patient and are negative.  I have reviewed the past medical history, past surgical history, social history and family history with the patient and they are unchanged from previous note.  ALLERGIES:  is allergic to bee venom and penicillins.  MEDICATIONS:  Current Outpatient Prescriptions  Medication Sig Dispense Refill  . acetaminophen-codeine (TYLENOL #3) 300-30 MG tablet Take 1 tablet by mouth 2 (two) times daily as needed for moderate pain. 60 tablet 1  . albuterol (PROVENTIL HFA;VENTOLIN HFA) 108 (90 BASE) MCG/ACT inhaler Inhale 2 puffs into the lungs every 4 (four) hours as needed for shortness of breath. For shortness of breath and wheezing    . beclomethasone (QVAR) 80 MCG/ACT inhaler Inhale 2 puffs into the lungs 2 (two) times daily.    . cetirizine (ZYRTEC) 10 MG tablet Take 10 mg by mouth daily.     . cycloSPORINE (RESTASIS) 0.05 % ophthalmic emulsion Place 1 drop into both eyes at bedtime.     . diclofenac sodium (VOLTAREN) 1 % GEL Apply 2 g topically daily as needed (Knee pain).     Marland Kitchen esomeprazole (NEXIUM) 40 MG capsule Take 40 mg by mouth 2 (two) times daily.     . fluticasone (FLONASE) 50 MCG/ACT nasal spray Place 2 sprays into both nostrils as needed for allergies.     . furosemide (LASIX) 20 MG tablet TAKE 2 TABLETS BY MOUTH ONCE DAILY 60 tablet 6  . levothyroxine (SYNTHROID, LEVOTHROID) 50 MCG tablet Take 1 tablet (50 mcg total) by mouth daily. 90 tablet 3  . metFORMIN (GLUCOPHAGE) 500 MG tablet Take 500 mg by mouth  daily.    . metoprolol succinate (TOPROL XL) 25 MG 24 hr tablet Take 0.5 tablets (12.5 mg total) by mouth daily. (Patient taking differently: Take 25 mg by mouth daily. ) 15 tablet 6  . montelukast (SINGULAIR) 10 MG tablet Take 10 mg by mouth at bedtime.    . Olopatadine HCl (PATADAY) 0.2 % SOLN Apply 1 drop to eye daily as needed (Allergies).     . potassium  chloride SA (K-DUR,KLOR-CON) 20 MEQ tablet     . rosuvastatin (CRESTOR) 40 MG tablet Take 1 tablet (40 mg total) by mouth daily. 90 tablet 3  . ALPRAZolam (XANAX) 0.25 MG tablet Take 0.25 mg by mouth. Reported on 05/02/2015    . EPINEPHrine 0.3 mg/0.3 mL IJ SOAJ injection Inject 0.3 mg into the muscle daily as needed (bee stings). Reported on 05/02/2015    . gabapentin (NEURONTIN) 300 MG capsule Take 1 capsule (300 mg total) by mouth 3 (three) times daily. (Patient not taking: Reported on 12/20/2014) 90 capsule 2  . Influenza Vac Split High-Dose (FLUZONE HIGH-DOSE) 0.5 ML SUSY Reported on 05/02/2015    . methocarbamol (ROBAXIN) 500 MG tablet Take 1 tablet (500 mg total) by mouth 3 (three) times daily. (Patient not taking: Reported on 12/20/2014) 45 tablet 0  . valsartan-hydrochlorothiazide (DIOVAN-HCT) 160-12.5 MG per tablet Take 1 tablet by mouth daily.     No current facility-administered medications for this visit.    PHYSICAL EXAMINATION: ECOG PERFORMANCE STATUS: 1 - Symptomatic but completely ambulatory  Filed Vitals:   05/02/15 1125  BP: 145/63  Pulse: 87  Temp: 97.6 F (36.4 C)  Resp: 18   Filed Weights   05/02/15 1125  Weight: 197 lb 1.6 oz (89.404 kg)    GENERAL:alert, no distress and comfortable SKIN: skin color, texture, turgor are normal, no rashes or significant lesions EYES: normal, Conjunctiva are pink and non-injected, sclera clear Musculoskeletal:no cyanosis of digits and no clubbing  NEURO: alert & oriented x 3 with fluent speech, no focal motor/sensory deficits  LABORATORY DATA:  I have reviewed the data as listed    Component Value Date/Time   NA 140 04/25/2015 1131   NA 139 11/08/2014 0457   K 3.7 04/25/2015 1131   K 4.0 11/08/2014 0457   CL 102 11/08/2014 0457   CO2 31* 04/25/2015 1131   CO2 31 11/08/2014 0457   GLUCOSE 99 04/25/2015 1131   GLUCOSE 140* 11/08/2014 0457   BUN 10.3 04/25/2015 1131   BUN 13 11/18/2014 1332   CREATININE 0.9 04/25/2015  1131   CREATININE 1.02 11/18/2014 1332   CREATININE 0.93 01/17/2014 1400   CALCIUM 9.3 04/25/2015 1131   CALCIUM 9.0 11/08/2014 0457   PROT 7.9 04/25/2015 1131   PROT 7.4 04/25/2015 1131   PROT 6.3* 11/07/2014 0619   ALBUMIN 3.8 04/25/2015 1131   ALBUMIN 3.1* 11/07/2014 0619   AST 16 04/25/2015 1131   AST 18 11/07/2014 0619   ALT 28 04/25/2015 1131   ALT 16 11/07/2014 0619   ALKPHOS 86 04/25/2015 1131   ALKPHOS 71 11/07/2014 0619   BILITOT 0.36 04/25/2015 1131   BILITOT 0.6 11/07/2014 0619   GFRNONAA >60 11/08/2014 0457   GFRAA >60 11/08/2014 0457    No results found for: SPEP, UPEP  Lab Results  Component Value Date   WBC 4.4 04/25/2015   NEUTROABS 1.9 04/25/2015   HGB 12.1 04/25/2015   HCT 36.0 04/25/2015   MCV 80.4 04/25/2015   PLT 188 04/25/2015  Chemistry      Component Value Date/Time   NA 140 04/25/2015 1131   NA 139 11/08/2014 0457   K 3.7 04/25/2015 1131   K 4.0 11/08/2014 0457   CL 102 11/08/2014 0457   CO2 31* 04/25/2015 1131   CO2 31 11/08/2014 0457   BUN 10.3 04/25/2015 1131   BUN 13 11/18/2014 1332   CREATININE 0.9 04/25/2015 1131   CREATININE 1.02 11/18/2014 1332   CREATININE 0.93 01/17/2014 1400      Component Value Date/Time   CALCIUM 9.3 04/25/2015 1131   CALCIUM 9.0 11/08/2014 0457   ALKPHOS 86 04/25/2015 1131   ALKPHOS 71 11/07/2014 0619   AST 16 04/25/2015 1131   AST 18 11/07/2014 0619   ALT 28 04/25/2015 1131   ALT 16 11/07/2014 0619   BILITOT 0.36 04/25/2015 1131   BILITOT 0.6 11/07/2014 9163       RADIOGRAPHIC STUDIES:I reviewed the recent skeletal survey. I have personally reviewed the radiological images as listed and agreed with the findings in the report.    ASSESSMENT & PLAN:  MGUS (monoclonal gammopathy of unknown significance) Clinically, she has no signs of disease progression. Her M spike is actually improving.  I will see her back in 12 months with repeat blood work to be done the week ahead of time so that  we can have results before see her back.   Chronic pain of right hip She has chronic right hip pain. X-ray showed degenerative arthritis. I recommend conservative management and physical therapy. I will refer her to see physical therapist.   Orders Placed This Encounter  Procedures  . Comprehensive metabolic panel    Standing Status: Future     Number of Occurrences:      Standing Expiration Date: 06/05/2016  . CBC with Differential/Platelet    Standing Status: Future     Number of Occurrences:      Standing Expiration Date: 06/05/2016  . Kappa/lambda light chains    Standing Status: Future     Number of Occurrences:      Standing Expiration Date: 06/05/2016  . Multiple Myeloma Panel (SPEP&IFE w/QIG)    Standing Status: Future     Number of Occurrences:      Standing Expiration Date: 06/05/2016  . Ambulatory referral to Physical Therapy    Referral Priority:  Routine    Referral Type:  Physical Medicine    Referral Reason:  Specialty Services Required    Requested Specialty:  Physical Therapy    Number of Visits Requested:  1   All questions were answered. The patient knows to call the clinic with any problems, questions or concerns. No barriers to learning was detected. I spent 15 minutes counseling the patient face to face. The total time spent in the appointment was 20 minutes and more than 50% was on counseling and review of test results     [facility], [place], MD 05/02/2015 11:57 AM

## 2015-05-02 NOTE — Assessment & Plan Note (Signed)
She has chronic right hip pain. X-ray showed degenerative arthritis. I recommend conservative management and physical therapy. I will refer her to see physical therapist.

## 2015-05-02 NOTE — Assessment & Plan Note (Signed)
Clinically, she has no signs of disease progression. Her M spike is actually improving.  I will see her back in 12 months with repeat blood work to be done the week ahead of time so that we can have results before see her back.

## 2015-05-02 NOTE — Telephone Encounter (Signed)
Gave and printed appt sched and avs for pt for March 2017 °

## 2015-05-16 ENCOUNTER — Telehealth: Payer: Self-pay | Admitting: Internal Medicine

## 2015-05-16 NOTE — Telephone Encounter (Signed)
Thanks for letting me know.  Dr. H 

## 2015-05-16 NOTE — Telephone Encounter (Signed)
Returned call to patient regarding request for lunesta refill Explained that this medication will need an MD approval/office visit Her last appt was March 2016 with Dr. Claiborne Billings  - she was supposed to come back after that visit in June 2016.  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 Claiborne Billings in June 2017 to download CPAP but then states she sometimes uses her son's CPAP and does not have one of her own Explained that lunesta cannot be refilled unless she is using CPAP and complaint She also states her issues may be from asthma. Patient decided to not see Dr. Claiborne Billings - will review with Dr. Debara Pickett at her appt in May No refill will be sent in at this time.   Routed to Dr. Debara Pickett as Juluis Rainier

## 2015-05-16 NOTE — Telephone Encounter (Signed)
Pt requesting refill of ESZOPILONE 2mg  at [facility] [phone]

## 2015-05-17 ENCOUNTER — Encounter: Payer: Self-pay | Admitting: Physical Medicine & Rehabilitation

## 2015-05-30 DIAGNOSIS — M810 Age-related osteoporosis without current pathological fracture: Secondary | ICD-10-CM | POA: Insufficient documentation

## 2015-05-31 ENCOUNTER — Ambulatory Visit (INDEPENDENT_AMBULATORY_CARE_PROVIDER_SITE_OTHER): Payer: Medicare HMO

## 2015-05-31 DIAGNOSIS — T63441D Toxic effect of venom of bees, accidental (unintentional), subsequent encounter: Secondary | ICD-10-CM

## 2015-06-02 ENCOUNTER — Other Ambulatory Visit: Payer: Self-pay | Admitting: Orthopaedic Surgery

## 2015-06-02 DIAGNOSIS — M48061 Spinal stenosis, lumbar region without neurogenic claudication: Secondary | ICD-10-CM

## 2015-06-07 ENCOUNTER — Ambulatory Visit (INDEPENDENT_AMBULATORY_CARE_PROVIDER_SITE_OTHER): Payer: Medicare HMO | Admitting: *Deleted

## 2015-06-07 DIAGNOSIS — J309 Allergic rhinitis, unspecified: Secondary | ICD-10-CM

## 2015-06-09 ENCOUNTER — Ambulatory Visit: Payer: Medicare HMO | Admitting: Internal Medicine

## 2015-06-10 ENCOUNTER — Ambulatory Visit
Admission: RE | Admit: 2015-06-10 | Discharge: 2015-06-10 | Disposition: A | Discharge status: Home / Self Care | Payer: Medicare HMO | Source: Ambulatory Visit | Attending: Orthopaedic Surgery | Admitting: Orthopaedic Surgery

## 2015-06-10 DIAGNOSIS — M48061 Spinal stenosis, lumbar region without neurogenic claudication: Secondary | ICD-10-CM

## 2015-06-13 ENCOUNTER — Ambulatory Visit (INDEPENDENT_AMBULATORY_CARE_PROVIDER_SITE_OTHER): Payer: Medicare HMO | Admitting: *Deleted

## 2015-06-13 DIAGNOSIS — J309 Allergic rhinitis, unspecified: Secondary | ICD-10-CM | POA: Diagnosis not present

## 2015-06-14 DIAGNOSIS — B372 Candidiasis of skin and nail: Secondary | ICD-10-CM | POA: Insufficient documentation

## 2015-06-23 ENCOUNTER — Ambulatory Visit (INDEPENDENT_AMBULATORY_CARE_PROVIDER_SITE_OTHER): Payer: Medicare HMO | Admitting: *Deleted

## 2015-06-23 DIAGNOSIS — J309 Allergic rhinitis, unspecified: Secondary | ICD-10-CM

## 2015-06-27 ENCOUNTER — Ambulatory Visit (INDEPENDENT_AMBULATORY_CARE_PROVIDER_SITE_OTHER): Payer: Medicare HMO | Admitting: *Deleted

## 2015-06-27 DIAGNOSIS — J309 Allergic rhinitis, unspecified: Secondary | ICD-10-CM | POA: Diagnosis not present

## 2015-07-05 ENCOUNTER — Ambulatory Visit: Payer: Self-pay

## 2015-07-05 ENCOUNTER — Encounter: Payer: Self-pay | Admitting: Allergy and Immunology

## 2015-07-05 ENCOUNTER — Ambulatory Visit (INDEPENDENT_AMBULATORY_CARE_PROVIDER_SITE_OTHER): Payer: Medicare HMO | Admitting: Allergy and Immunology

## 2015-07-05 VITALS — BP 130/70 | HR 85 | Resp 16

## 2015-07-05 DIAGNOSIS — Z91030 Bee allergy status: Secondary | ICD-10-CM

## 2015-07-05 DIAGNOSIS — J309 Allergic rhinitis, unspecified: Secondary | ICD-10-CM

## 2015-07-05 DIAGNOSIS — K219 Gastro-esophageal reflux disease without esophagitis: Secondary | ICD-10-CM

## 2015-07-05 DIAGNOSIS — Z91038 Other insect allergy status: Secondary | ICD-10-CM

## 2015-07-05 DIAGNOSIS — J4551 Severe persistent asthma with (acute) exacerbation: Secondary | ICD-10-CM | POA: Diagnosis not present

## 2015-07-05 DIAGNOSIS — J3089 Other allergic rhinitis: Secondary | ICD-10-CM | POA: Diagnosis not present

## 2015-07-05 MED ORDER — METHYLPREDNISOLONE ACETATE 80 MG/ML IJ SUSP
80.0000 mg | Freq: Once | INTRAMUSCULAR | Status: AC
  Administered 2015-07-05: 80 mg via INTRAMUSCULAR

## 2015-07-05 NOTE — Patient Instructions (Addendum)
  1. Continue Dulera 200 2 inhalations + Qvar 80 2 inhalations two times per day  2. Continue Fluticasone 1-2 sprays each nostril two times per day  3. Continue montelukast 10mg  one tablet one time per day  4. Continue omeprazole 40 one tablet twice a day  5. Continue Proair HFA, Xyzal, Pataday if needed.  6. Continue immunotherapy for venoms and aeroallergens   7. Check Blood to examine Xolair qualification: IgE level  8. Depo-Medrol 80 IM delivered in clinic  9. Return to clinic in 4 weeks or earlier if problem

## 2015-07-05 NOTE — Progress Notes (Signed)
Follow-up Note  Referring Provider: Darden Amber, PA Primary Provider: Darden Amber, PA Date of Office Visit: 07/05/2015  Subjective:   Andrea Lowery (DOB: March 20, 1940) is a 75 y.o. female who returns to the [facility] [place] on 07/05/2015 in re-evaluation of the following:  HPI: [name] returns to this clinic in reevaluation of her asthma and allergic rhinitis and history of chronic sinusitis and reflux-induced respiratory disease. I've not seen her in his clinic since November 2016.  Her asthma has been stable but her stability is defined by the use of a short acting bronchodilator twice a day for episodes of shortness of breath and chest tightness and coughing and wheezing even in the face of utilizing her Dulera and Qvar combination and montelukast. She has not required a systemic steroids since I've last seen her in this clinic.  Her nose is doing relatively well as long she continues on her nasal fluticasone and montelukast.  She believes that her reflux is under very good control while consistently using her Nexium twice a day. She's had very little issues with her throat.  She continues on immunotherapy for both venoms and aeroallergens and has not had any adverse effects secondary to this administration. She does have an EpiPen.    Medication List           ACCU-CHEK AVIVA PLUS w/Device Kit  by Does not apply route.     acetaminophen-codeine 300-30 MG tablet  Commonly known as:  TYLENOL #3  Take 1 tablet by mouth 2 (two) times daily as needed for moderate pain.     albuterol 108 (90 Base) MCG/ACT inhaler  Commonly known as:  PROVENTIL HFA;VENTOLIN HFA  Inhale 2 puffs into the lungs every 4 (four) hours as needed for shortness of breath. For shortness of breath and wheezing     ALPRAZolam 0.25 MG tablet  Commonly known as:  XANAX  Take 0.25 mg by mouth. Reported on 05/02/2015     baclofen 10 MG tablet  Commonly known as:  LIORESAL  Take by  mouth.     beclomethasone 80 MCG/ACT inhaler  Commonly known as:  QVAR  Inhale 2 puffs into the lungs 2 (two) times daily.     cetirizine 10 MG tablet  Commonly known as:  ZYRTEC  Take 10 mg by mouth daily.     cycloSPORINE 0.05 % ophthalmic emulsion  Commonly known as:  RESTASIS  Place 1 drop into both eyes at bedtime.     EPINEPHrine 0.3 mg/0.3 mL Soaj injection  Commonly known as:  EPI-PEN  Inject 0.3 mg into the muscle daily as needed (bee stings). Reported on 05/02/2015     esomeprazole 40 MG capsule  Commonly known as:  NEXIUM  Take 40 mg by mouth 2 (two) times daily.     fluticasone 50 MCG/ACT nasal spray  Commonly known as:  FLONASE  Place 2 sprays into both nostrils as needed for allergies.     FLUZONE HIGH-DOSE 0.5 ML Susy  Generic drug:  Influenza Vac Split High-Dose  Reported on 05/02/2015     FOSAMAX 70 MG tablet  Generic drug:  alendronate     furosemide 20 MG tablet  Commonly known as:  LASIX  TAKE 2 TABLETS BY MOUTH ONCE DAILY     gabapentin 300 MG capsule  Commonly known as:  NEURONTIN  Take 1 capsule (300 mg total) by mouth 3 (three) times daily.     levothyroxine 50 MCG tablet  Commonly known as:  SYNTHROID, LEVOTHROID  Take 1 tablet (50 mcg total) by mouth daily.     metFORMIN 500 MG tablet  Commonly known as:  GLUCOPHAGE  Take 500 mg by mouth daily.     methocarbamol 500 MG tablet  Commonly known as:  ROBAXIN  Take 1 tablet (500 mg total) by mouth 3 (three) times daily.     metoprolol succinate 25 MG 24 hr tablet  Commonly known as:  TOPROL XL  Take 0.5 tablets (12.5 mg total) by mouth daily.     mometasone-formoterol 100-5 MCG/ACT Aero  Commonly known as:  DULERA  Inhale into the lungs.     montelukast 10 MG tablet  Commonly known as:  SINGULAIR  Take 10 mg by mouth at bedtime.     PATADAY 0.2 % Soln  Generic drug:  Olopatadine HCl  Apply 1 drop to eye daily as needed (Allergies).     potassium chloride SA 20 MEQ tablet    Commonly known as:  K-DUR,KLOR-CON     rosuvastatin 40 MG tablet  Commonly known as:  CRESTOR  Take 1 tablet (40 mg total) by mouth daily.     traMADol 50 MG tablet  Commonly known as:  ULTRAM  Take by mouth.     traZODone 50 MG tablet  Commonly known as:  DESYREL     valsartan-hydrochlorothiazide 160-12.5 MG tablet  Commonly known as:  DIOVAN-HCT  Take 1 tablet by mouth daily.     VOLTAREN 1 % Gel  Generic drug:  diclofenac sodium  Apply 2 g topically daily as needed (Knee pain).        Past Medical History  Diagnosis Date  . Asthma   . Hypertension   . OSA on CPAP   . Dyslipidemia   . Hypothyroidism   . LBBB (left bundle branch block)   . MGUS (monoclonal gammopathy of unknown significance)   . Diabetes mellitus without complication ([place])   . Arthritis   . MGUS (monoclonal gammopathy of unknown significance)     Past Surgical History  Procedure Laterality Date  . Cholecystectomy  1976  . Tonsillectomy  1965  . Abdominal hysterectomy  1969  . Plantar fascia release    . US echocardiography  09/20/2008    borderline LVH,mild TR,AOV mildly sclerotic w/ca+ of the leaflets  . Nm myoview ltd  02/13/2010    No ischemia  . Artery biopsy N/A 11/07/2014    Procedure: BIOPSY TEMPORAL ARTERY;  Surgeon: Leta Baptist, MD;  Location: [facility] OR;  Service: ENT;  Laterality: N/A;    Allergies  Allergen Reactions  . Bee Venom Anaphylaxis    Yellow jackets  . Penicillins Hives    Review of systems negative except as noted in HPI / PMHx or noted below:  Review of Systems  Constitutional: Negative.   HENT: Negative.   Eyes: Negative.   Respiratory: Negative.   Cardiovascular: Negative.   Gastrointestinal: Negative.   Genitourinary: Negative.   Musculoskeletal: Negative.   Skin: Negative.   Neurological: Negative.   Endo/Heme/Allergies: Negative.   Psychiatric/Behavioral: Negative.      Objective:   Filed Vitals:   07/05/15 1149  BP: 130/70  Pulse: 85  Resp: 16           Physical Exam  Constitutional: She is well-developed, well-nourished, and in no distress.  HENT:  Head: Normocephalic.  Right Ear: Tympanic membrane, external ear and ear canal normal.  Left Ear: Tympanic membrane, external ear and ear canal  normal.  Nose: Nose normal. No mucosal edema or rhinorrhea.  Mouth/Throat: Uvula is midline, oropharynx is clear and moist and mucous membranes are normal. No oropharyngeal exudate.  Eyes: Conjunctivae are normal.  Neck: Trachea normal. No tracheal tenderness present. No tracheal deviation present. No thyromegaly present.  Cardiovascular: Normal rate, regular rhythm, S1 normal, S2 normal and normal heart sounds.   No murmur heard. Pulmonary/Chest: Breath sounds normal. No stridor. No respiratory distress. She has no wheezes. She has no rales.  Musculoskeletal: She exhibits no edema.  Lymphadenopathy:       Head (right side): No tonsillar adenopathy present.       Head (left side): No tonsillar adenopathy present.    She has no cervical adenopathy.  Neurological: She is alert. Gait normal.  Skin: No rash noted. She is not diaphoretic. No erythema. Nails show no clubbing.  Psychiatric: Mood and affect normal.    Diagnostics:    Spirometry was performed and demonstrated an FEV1 of 1.26 at 65 % of predicted.  The patient had an Asthma Control Test with the following results:  .    Assessment and Plan:   1. Asthma, not well controlled, severe persistent, with acute exacerbation   2. Other allergic rhinitis   3. Gastroesophageal reflux disease, esophagitis presence not specified   4. Hymenoptera allergy     1. Continue Dulera 200 2 inhalations + Qvar 80 2 inhalations two times per day  2. Continue Fluticasone 1-2 sprays each nostril two times per day  3. Continue montelukast 80m one tablet one time per day  4. Continue omeprazole 40 one tablet twice a day  5. Continue Proair HFA, Xyzal, Pataday if needed.  6. Continue  immunotherapy for venoms and aeroallergens   7. Check Blood to examine for Xolair qualification: IgE level  8. Depo-Medrol 80 IM delivered in clinic  9. Return to clinic in 4 weeks or earlier if problem  PAritzelstill remains with some activity of her asthma in the face of very aggressive anti-inflammatory therapy for her respiratory tract and we'll now see if she is a candidate for omalizumab administration by checking a serum IgE level. If she does qualify I would recommend that she start this form of medication. For now she will continue to use her combination of Dulera and Qvar and montelukast and I've given her a systemic steroid today. I'll see her back in this clinic in 4 weeks or earlier if there is a problem.  EAllena Katz MD [place]

## 2015-07-06 ENCOUNTER — Encounter: Payer: Self-pay | Admitting: Allergy and Immunology

## 2015-07-06 LAB — IGE: IgE (Immunoglobulin E), Serum: 27 kU/L (ref <115)

## 2015-07-13 ENCOUNTER — Ambulatory Visit (INDEPENDENT_AMBULATORY_CARE_PROVIDER_SITE_OTHER): Payer: Medicare HMO

## 2015-07-13 DIAGNOSIS — T63441D Toxic effect of venom of bees, accidental (unintentional), subsequent encounter: Secondary | ICD-10-CM

## 2015-07-20 ENCOUNTER — Other Ambulatory Visit: Payer: Self-pay | Admitting: Orthopaedic Surgery

## 2015-07-25 ENCOUNTER — Encounter (HOSPITAL_COMMUNITY): Payer: Self-pay

## 2015-07-25 ENCOUNTER — Encounter (HOSPITAL_COMMUNITY)
Admission: RE | Admit: 2015-07-25 | Discharge: 2015-07-25 | Disposition: A | Discharge status: Home / Self Care | Payer: Medicare HMO | Source: Ambulatory Visit | Attending: Orthopaedic Surgery | Admitting: Orthopaedic Surgery

## 2015-07-25 ENCOUNTER — Ambulatory Visit (HOSPITAL_COMMUNITY)
Admission: RE | Admit: 2015-07-25 | Discharge: 2015-07-25 | Disposition: A | Discharge status: Home / Self Care | Payer: Medicare HMO | Source: Ambulatory Visit | Attending: Orthopaedic Surgery | Admitting: Orthopaedic Surgery

## 2015-07-25 DIAGNOSIS — Z01812 Encounter for preprocedural laboratory examination: Secondary | ICD-10-CM | POA: Diagnosis not present

## 2015-07-25 DIAGNOSIS — Z01818 Encounter for other preprocedural examination: Secondary | ICD-10-CM | POA: Insufficient documentation

## 2015-07-25 DIAGNOSIS — M4806 Spinal stenosis, lumbar region: Secondary | ICD-10-CM | POA: Diagnosis not present

## 2015-07-25 DIAGNOSIS — M48061 Spinal stenosis, lumbar region without neurogenic claudication: Secondary | ICD-10-CM

## 2015-07-25 HISTORY — DX: Headache: R51

## 2015-07-25 HISTORY — DX: Acute embolism and thrombosis of unspecified deep veins of unspecified lower extremity: I82.409

## 2015-07-25 HISTORY — DX: Gastro-esophageal reflux disease without esophagitis: K21.9

## 2015-07-25 HISTORY — DX: Headache, unspecified: R51.9

## 2015-07-25 LAB — COMPREHENSIVE METABOLIC PANEL
ALT: 43 U/L (ref 14–54)
AST: 30 U/L (ref 15–41)
Albumin: 3.9 g/dL (ref 3.5–5.0)
Alkaline Phosphatase: 87 U/L (ref 38–126)
Anion gap: 5 (ref 5–15)
BUN: 11 mg/dL (ref 6–20)
CO2: 29 mmol/L (ref 22–32)
Calcium: 9.7 mg/dL (ref 8.9–10.3)
Chloride: 100 mmol/L — ABNORMAL LOW (ref 101–111)
Creatinine, Ser: 0.99 mg/dL (ref 0.44–1.00)
GFR calc Af Amer: >60 mL/min (ref >60)
GFR calc non Af Amer: 55 mL/min — ABNORMAL LOW (ref >60)
Glucose, Bld: 98 mg/dL (ref 65–99)
Potassium: 3.4 mmol/L — ABNORMAL LOW (ref 3.5–5.1)
Sodium: 134 mmol/L — ABNORMAL LOW (ref 135–145)
Total Bilirubin: 0.8 mg/dL (ref 0.3–1.2)
Total Protein: 7.8 g/dL (ref 6.5–8.1)

## 2015-07-25 LAB — CBC
HCT: 37.6 % (ref 36.0–46.0)
Hemoglobin: 12.5 g/dL (ref 12.0–15.0)
MCH: 26.8 pg (ref 26.0–34.0)
MCHC: 33.2 g/dL (ref 30.0–36.0)
MCV: 80.7 fL (ref 78.0–100.0)
Platelets: 207 10*3/uL (ref 150–400)
RBC: 4.66 MIL/uL (ref 3.87–5.11)
RDW: 15.3 % (ref 11.5–15.5)
WBC: 7.5 10*3/uL (ref 4.0–10.5)

## 2015-07-25 LAB — SURGICAL PCR SCREEN
MRSA, PCR: NEGATIVE
Staphylococcus aureus: NEGATIVE

## 2015-07-25 LAB — PROTIME-INR
INR: 1.04 (ref 0.00–1.49)
Prothrombin Time: 13.8 seconds (ref 11.6–15.2)

## 2015-07-25 LAB — APTT: aPTT: 25 seconds (ref 24–37)

## 2015-07-25 NOTE — Progress Notes (Signed)
Andrea Lowery denies chest pain or shortness of breath.  Patient has a known Left BBB.   Andrea Lowery had a negative study 01/25/14 (had a positive sleep study in 2008, has lost weight since then).   When asked about diabetes, patient reported that she sometimes takes Metformin. I discussed the importance of checking CBG, that it effects healingl.  Patient said she will check CBG more often and take Metformin. Andrea Trauger repeated instructions for CBG < 70 theday of surgery.

## 2015-07-25 NOTE — Pre-Procedure Instructions (Signed)
    Andrea Lowery  07/25/2015    Your procedure is scheduled on Monday, June 26.  Report to [facility] Admitting at 1:40 PM                Your surgery or procedure is scheduled for 3:4 0 PM   Call this number if you have problems the morning of f surgery:[phone]                For any other questions, please call [phone], Monday - Friday 8 AM - 4 PM.   Remember:  Do not eat food or drink liquids after midnight Sunday, June 25.  Take these medicines the morning of surgery with A SIP OF WATER:cetirizine (ZYRTEC), esomeprazole (NEXIUM)levothyroxine (SYNTHROID, LEVOTHROID),  metoprolol succinate (TOPROL XL).  May use nasal sprays and Inhalers.  Please bring Dulera inhaler to the hospital with you.             Take if needed: acetaminophen-codeine (TYLENOL #3).                STOP taking any Vitamins, Herbal Medications, Aspirin, Aspirn Products, Ibuprofen (Advil), Naproxen (Aleve), diclofenac sodium (VOLTAREN).   Do not wear jewelry, make-up or nail polish.  Do not wear lotions, powders, or perfumes.  Do not shave 48 hours prior to surgery.    Do not bring valuables to the hospital.  [facility] is not responsible for any belongings or valuables.  Contacts, dentures or bridgework may not be worn into surgery.  Leave your suitcase in the car.  After surgery it may be brought to your room.  For patients admitted to the hospital, discharge time will be determined by your treatment team.  Please read over handouts:[facility]- Preparing For Surgery and Patient Instructions for Mupirocin Application, Incentive Spirometrer

## 2015-07-25 NOTE — Progress Notes (Signed)
   How to Manage Your Diabetes Before and After Surgery  Why is it important to control my blood sugar before and after surgery? . Improving blood sugar levels before and after surgery helps healing and can limit problems. . A way of improving blood sugar control is eating a healthy diet by: o  Eating less sugar and carbohydrates o  Increasing activity/exercise o  Talking with your doctor about reaching your blood sugar goals . High blood sugars (greater than 180 mg/dL) can raise your risk of infections and slow your recovery, so you will need to focus on controlling your diabetes during the weeks before surgery. . Make sure that the doctor who takes care of your diabetes knows about your planned surgery including the date and location.  How do I manage my blood sugar before surgery? . Check your blood sugar at least 4 times a day, starting 2 days before surgery, to make sure that the level is not too high or low. o Check your blood sugar the morning of your surgery when you wake up and every 2 hours until you get to the Short Stay unit. . If your blood sugar is less than 70 mg/dL, you will need to treat for low blood sugar: o Do not take insulin. o Treat a low blood sugar (less than 70 mg/dL) with  cup of clear juice (cranberry or apple), 4 glucose tablets, OR glucose gel. o Recheck blood sugar in 15 minutes after treatment (to make sure it is greater than 70 mg/dL). If your blood sugar is not greater than 70 mg/dL on recheck, call [phone] for further instructions. . Report your blood sugar to the short stay nurse when you get to Short Stay.  . If you are admitted to the hospital after surgery: o Your blood sugar will be checked by the staff and you will probably be given insulin after surgery (instead of oral diabetes medicines) to make sure you have good blood sugar levels. o The goal for blood sugar control after surgery is 80-180 mg/dL.           WHAT DO I DO ABOUT MY  DIABETES MEDICATION?   Marland Kitchen Do not take oral diabetes medicines (pills) the morning of surgery.

## 2015-07-26 LAB — HEMOGLOBIN A1C
Hgb A1c MFr Bld: 6.7 % — ABNORMAL HIGH (ref 4.8–5.6)
Mean Plasma Glucose: 146 mg/dL

## 2015-07-28 MED ORDER — CLINDAMYCIN PHOSPHATE 900 MG/50ML IV SOLN
900.0000 mg | INTRAVENOUS | Status: AC
Start: 2015-07-31 — End: 2015-07-31
  Administered 2015-07-31: 900 mg via INTRAVENOUS
  Filled 2015-07-28: qty 50

## 2015-07-31 ENCOUNTER — Ambulatory Visit (HOSPITAL_COMMUNITY): Payer: Medicare HMO | Admitting: Anesthesiology

## 2015-07-31 ENCOUNTER — Encounter (HOSPITAL_COMMUNITY)
Admission: RE | Disposition: A | Discharge status: Home / Self Care | Payer: Self-pay | Source: Ambulatory Visit | Attending: Orthopaedic Surgery

## 2015-07-31 ENCOUNTER — Observation Stay (HOSPITAL_COMMUNITY)
Admission: RE | Admit: 2015-07-31 | Discharge: 2015-08-01 | Disposition: A | Discharge status: Home / Self Care | Payer: Medicare HMO | Source: Ambulatory Visit | Attending: Orthopaedic Surgery | Admitting: Orthopaedic Surgery

## 2015-07-31 ENCOUNTER — Encounter (HOSPITAL_COMMUNITY): Payer: Self-pay | Admitting: General Practice

## 2015-07-31 ENCOUNTER — Ambulatory Visit (HOSPITAL_COMMUNITY): Payer: Medicare HMO

## 2015-07-31 DIAGNOSIS — Z79899 Other long term (current) drug therapy: Secondary | ICD-10-CM | POA: Insufficient documentation

## 2015-07-31 DIAGNOSIS — E785 Hyperlipidemia, unspecified: Secondary | ICD-10-CM | POA: Diagnosis not present

## 2015-07-31 DIAGNOSIS — Z86718 Personal history of other venous thrombosis and embolism: Secondary | ICD-10-CM | POA: Insufficient documentation

## 2015-07-31 DIAGNOSIS — I10 Essential (primary) hypertension: Secondary | ICD-10-CM | POA: Insufficient documentation

## 2015-07-31 DIAGNOSIS — E119 Type 2 diabetes mellitus without complications: Secondary | ICD-10-CM | POA: Insufficient documentation

## 2015-07-31 DIAGNOSIS — Z6831 Body mass index (BMI) 31.0-31.9, adult: Secondary | ICD-10-CM | POA: Diagnosis not present

## 2015-07-31 DIAGNOSIS — M4806 Spinal stenosis, lumbar region: Secondary | ICD-10-CM | POA: Diagnosis not present

## 2015-07-31 DIAGNOSIS — Z7984 Long term (current) use of oral hypoglycemic drugs: Secondary | ICD-10-CM | POA: Diagnosis not present

## 2015-07-31 DIAGNOSIS — Z87891 Personal history of nicotine dependence: Secondary | ICD-10-CM | POA: Diagnosis not present

## 2015-07-31 DIAGNOSIS — E039 Hypothyroidism, unspecified: Secondary | ICD-10-CM | POA: Insufficient documentation

## 2015-07-31 DIAGNOSIS — Z9889 Other specified postprocedural states: Secondary | ICD-10-CM

## 2015-07-31 DIAGNOSIS — G4733 Obstructive sleep apnea (adult) (pediatric): Secondary | ICD-10-CM | POA: Insufficient documentation

## 2015-07-31 DIAGNOSIS — Z419 Encounter for procedure for purposes other than remedying health state, unspecified: Secondary | ICD-10-CM

## 2015-07-31 HISTORY — PX: LUMBAR LAMINECTOMY/DECOMPRESSION MICRODISCECTOMY: SHX5026

## 2015-07-31 LAB — GLUCOSE, CAPILLARY
Glucose-Capillary: 130 mg/dL — ABNORMAL HIGH (ref 65–99)
Glucose-Capillary: 103 mg/dL — ABNORMAL HIGH (ref 65–99)
Glucose-Capillary: 113 mg/dL — ABNORMAL HIGH (ref 65–99)
Glucose-Capillary: 159 mg/dL — ABNORMAL HIGH (ref 65–99)

## 2015-07-31 SURGERY — LUMBAR LAMINECTOMY/DECOMPRESSION MICRODISCECTOMY
Anesthesia: General | Site: Spine Lumbar

## 2015-07-31 MED ORDER — VANCOMYCIN HCL IN DEXTROSE 1-5 GM/200ML-% IV SOLN
1000.0000 mg | Freq: Once | INTRAVENOUS | Status: AC
  Administered 2015-08-01: 1000 mg via INTRAVENOUS
  Filled 2015-07-31: qty 200

## 2015-07-31 MED ORDER — TOPIRAMATE 25 MG PO TABS
50.0000 mg | ORAL_TABLET | Freq: Every day | ORAL | Status: DC
  Filled 2015-07-31: qty 2

## 2015-07-31 MED ORDER — PHENYLEPHRINE 40 MCG/ML (10ML) SYRINGE FOR IV PUSH (FOR BLOOD PRESSURE SUPPORT)
PREFILLED_SYRINGE | INTRAVENOUS | Status: AC
  Filled 2015-07-31: qty 10

## 2015-07-31 MED ORDER — SODIUM CHLORIDE 0.9% FLUSH
3.0000 mL | INTRAVENOUS | Status: DC | PRN
Start: 2015-07-31 — End: 2015-08-01

## 2015-07-31 MED ORDER — FENTANYL CITRATE (PF) 250 MCG/5ML IJ SOLN
INTRAMUSCULAR | Status: AC
  Filled 2015-07-31: qty 5

## 2015-07-31 MED ORDER — ACETAMINOPHEN 650 MG RE SUPP: 650.0000 mg | RECTAL | Status: DC | PRN

## 2015-07-31 MED ORDER — SUGAMMADEX SODIUM 500 MG/5ML IV SOLN
INTRAVENOUS | Status: DC | PRN
  Administered 2015-07-31: 357.6 mg via INTRAVENOUS

## 2015-07-31 MED ORDER — METOPROLOL SUCCINATE ER 25 MG PO TB24
12.5000 mg | ORAL_TABLET | Freq: Every day | ORAL | Status: DC
  Administered 2015-08-01: 12.5 mg via ORAL
  Filled 2015-07-31: qty 1

## 2015-07-31 MED ORDER — OXYCODONE HCL 5 MG PO TABS: 5.0000 mg | ORAL_TABLET | Freq: Once | ORAL | Status: DC | PRN

## 2015-07-31 MED ORDER — ACETAMINOPHEN 325 MG PO TABS: 650.0000 mg | ORAL_TABLET | ORAL | Status: DC | PRN

## 2015-07-31 MED ORDER — POLYETHYLENE GLYCOL 3350 17 G PO PACK: 17.0000 g | PACK | Freq: Every day | ORAL | Status: DC | PRN

## 2015-07-31 MED ORDER — FLEET ENEMA 7-19 GM/118ML RE ENEM: 1.0000 | ENEMA | Freq: Once | RECTAL | Status: DC | PRN

## 2015-07-31 MED ORDER — OLOPATADINE HCL 0.1 % OP SOLN
1.0000 [drp] | Freq: Two times a day (BID) | OPHTHALMIC | Status: DC
  Administered 2015-07-31 – 2015-08-01 (×2): 1 [drp] via OPHTHALMIC
  Filled 2015-07-31: qty 5

## 2015-07-31 MED ORDER — VALSARTAN-HYDROCHLOROTHIAZIDE 160-12.5 MG PO TABS: 1.0000 | ORAL_TABLET | Freq: Every day | ORAL | Status: DC

## 2015-07-31 MED ORDER — PROPOFOL 10 MG/ML IV BOLUS
INTRAVENOUS | Status: DC | PRN
  Administered 2015-07-31: 100 mg via INTRAVENOUS

## 2015-07-31 MED ORDER — LEVOTHYROXINE SODIUM 50 MCG PO TABS
50.0000 ug | ORAL_TABLET | Freq: Every day | ORAL | Status: DC
  Administered 2015-08-01: 50 ug via ORAL
  Filled 2015-07-31: qty 1

## 2015-07-31 MED ORDER — ONDANSETRON HCL 4 MG/2ML IJ SOLN
4.0000 mg | INTRAMUSCULAR | Status: DC | PRN
  Filled 2015-07-31: qty 2

## 2015-07-31 MED ORDER — MONTELUKAST SODIUM 10 MG PO TABS
10.0000 mg | ORAL_TABLET | Freq: Every day | ORAL | Status: DC
  Administered 2015-07-31: 10 mg via ORAL
  Filled 2015-07-31: qty 1

## 2015-07-31 MED ORDER — MENTHOL 3 MG MT LOZG: 1.0000 | LOZENGE | OROMUCOSAL | Status: DC | PRN

## 2015-07-31 MED ORDER — FLUTICASONE PROPIONATE 50 MCG/ACT NA SUSP
2.0000 | Freq: Every day | NASAL | Status: DC | PRN
  Filled 2015-07-31: qty 16

## 2015-07-31 MED ORDER — PROPOFOL 10 MG/ML IV BOLUS
INTRAVENOUS | Status: AC
  Filled 2015-07-31: qty 20

## 2015-07-31 MED ORDER — ROSUVASTATIN CALCIUM 20 MG PO TABS
40.0000 mg | ORAL_TABLET | Freq: Every day | ORAL | Status: DC
  Administered 2015-08-01: 40 mg via ORAL
  Filled 2015-07-31: qty 2

## 2015-07-31 MED ORDER — HYDROMORPHONE HCL 1 MG/ML IJ SOLN
0.2500 mg | INTRAMUSCULAR | Status: DC | PRN
  Administered 2015-07-31 (×3): 0.5 mg via INTRAVENOUS

## 2015-07-31 MED ORDER — FUROSEMIDE 40 MG PO TABS
40.0000 mg | ORAL_TABLET | Freq: Every day | ORAL | Status: DC
  Administered 2015-08-01: 40 mg via ORAL
  Filled 2015-07-31: qty 1

## 2015-07-31 MED ORDER — MOMETASONE FURO-FORMOTEROL FUM 100-5 MCG/ACT IN AERO
2.0000 | INHALATION_SPRAY | Freq: Two times a day (BID) | RESPIRATORY_TRACT | Status: DC
Start: 2015-08-01 — End: 2015-08-01
  Filled 2015-07-31: qty 8.8

## 2015-07-31 MED ORDER — HYDROMORPHONE HCL 1 MG/ML IJ SOLN
INTRAMUSCULAR | Status: AC
  Filled 2015-07-31: qty 1

## 2015-07-31 MED ORDER — LIDOCAINE HCL (CARDIAC) 20 MG/ML IV SOLN
INTRAVENOUS | Status: DC | PRN
  Administered 2015-07-31: 40 mg via INTRAVENOUS

## 2015-07-31 MED ORDER — BUPIVACAINE HCL (PF) 0.25 % IJ SOLN
INTRAMUSCULAR | Status: DC | PRN
  Administered 2015-07-31: 10 mL

## 2015-07-31 MED ORDER — ROCURONIUM BROMIDE 50 MG/5ML IV SOLN
INTRAVENOUS | Status: AC
  Filled 2015-07-31: qty 1

## 2015-07-31 MED ORDER — PHENYLEPHRINE HCL 10 MG/ML IJ SOLN
INTRAMUSCULAR | Status: DC | PRN
  Administered 2015-07-31: 120 ug via INTRAVENOUS

## 2015-07-31 MED ORDER — BUPIVACAINE HCL (PF) 0.25 % IJ SOLN
INTRAMUSCULAR | Status: AC
  Filled 2015-07-31: qty 30

## 2015-07-31 MED ORDER — LIDOCAINE 2% (20 MG/ML) 5 ML SYRINGE
INTRAMUSCULAR | Status: AC
  Filled 2015-07-31: qty 5

## 2015-07-31 MED ORDER — CETYLPYRIDINIUM CHLORIDE 0.05 % MT LIQD
7.0000 mL | Freq: Two times a day (BID) | OROMUCOSAL | Status: DC
  Administered 2015-07-31: 7 mL via OROMUCOSAL

## 2015-07-31 MED ORDER — ONDANSETRON HCL 4 MG/2ML IJ SOLN
INTRAMUSCULAR | Status: AC
  Filled 2015-07-31: qty 2

## 2015-07-31 MED ORDER — FENTANYL CITRATE (PF) 250 MCG/5ML IJ SOLN
INTRAMUSCULAR | Status: DC | PRN
  Administered 2015-07-31: 100 ug via INTRAVENOUS
  Administered 2015-07-31 (×2): 50 ug via INTRAVENOUS

## 2015-07-31 MED ORDER — LACTATED RINGERS IV SOLN
INTRAVENOUS | Status: DC
  Administered 2015-07-31 (×2): via INTRAVENOUS

## 2015-07-31 MED ORDER — IRBESARTAN 300 MG PO TABS
150.0000 mg | ORAL_TABLET | Freq: Every day | ORAL | Status: DC
  Administered 2015-08-01: 150 mg via ORAL
  Filled 2015-07-31: qty 1

## 2015-07-31 MED ORDER — LORATADINE 10 MG PO TABS
10.0000 mg | ORAL_TABLET | Freq: Every day | ORAL | Status: DC
  Administered 2015-08-01: 10 mg via ORAL
  Filled 2015-07-31: qty 1

## 2015-07-31 MED ORDER — HYDROCHLOROTHIAZIDE 12.5 MG PO CAPS
12.5000 mg | ORAL_CAPSULE | Freq: Every day | ORAL | Status: DC
  Administered 2015-08-01: 12.5 mg via ORAL
  Filled 2015-07-31: qty 1

## 2015-07-31 MED ORDER — PANTOPRAZOLE SODIUM 40 MG PO TBEC
40.0000 mg | DELAYED_RELEASE_TABLET | Freq: Every day | ORAL | Status: DC
  Administered 2015-08-01: 40 mg via ORAL
  Filled 2015-07-31: qty 1

## 2015-07-31 MED ORDER — CHLORHEXIDINE GLUCONATE 4 % EX LIQD: 60.0000 mL | Freq: Once | CUTANEOUS | Status: DC

## 2015-07-31 MED ORDER — OXYCODONE HCL 5 MG/5ML PO SOLN: 5.0000 mg | Freq: Once | ORAL | Status: DC | PRN

## 2015-07-31 MED ORDER — HYDROMORPHONE HCL 1 MG/ML IJ SOLN
0.5000 mg | INTRAMUSCULAR | Status: DC | PRN
  Administered 2015-07-31 – 2015-08-01 (×2): 1 mg via INTRAVENOUS
  Filled 2015-07-31 (×2): qty 1

## 2015-07-31 MED ORDER — ESMOLOL HCL 100 MG/10ML IV SOLN
INTRAVENOUS | Status: AC
  Filled 2015-07-31: qty 10

## 2015-07-31 MED ORDER — ROCURONIUM BROMIDE 100 MG/10ML IV SOLN
INTRAVENOUS | Status: DC | PRN
  Administered 2015-07-31: 50 mg via INTRAVENOUS

## 2015-07-31 MED ORDER — SODIUM CHLORIDE 0.9 % IV SOLN: 250.0000 mL | INTRAVENOUS | Status: DC

## 2015-07-31 MED ORDER — PHENOL 1.4 % MT LIQD: 1.0000 | OROMUCOSAL | Status: DC | PRN

## 2015-07-31 MED ORDER — DEXTROSE 5 % IV SOLN
500.0000 mg | Freq: Four times a day (QID) | INTRAVENOUS | Status: DC | PRN
  Administered 2015-07-31: 500 mg via INTRAVENOUS
  Filled 2015-07-31 (×2): qty 5

## 2015-07-31 MED ORDER — MEPERIDINE HCL 25 MG/ML IJ SOLN: 6.2500 mg | INTRAMUSCULAR | Status: DC | PRN

## 2015-07-31 MED ORDER — TRAZODONE HCL 50 MG PO TABS: 50.0000 mg | ORAL_TABLET | Freq: Every evening | ORAL | Status: DC | PRN

## 2015-07-31 MED ORDER — SUGAMMADEX SODIUM 500 MG/5ML IV SOLN
INTRAVENOUS | Status: AC
  Filled 2015-07-31: qty 5

## 2015-07-31 MED ORDER — 0.9 % SODIUM CHLORIDE (POUR BTL) OPTIME
TOPICAL | Status: DC | PRN
  Administered 2015-07-31: 1000 mL

## 2015-07-31 MED ORDER — DOCUSATE SODIUM 100 MG PO CAPS
100.0000 mg | ORAL_CAPSULE | Freq: Two times a day (BID) | ORAL | Status: DC
  Administered 2015-07-31 – 2015-08-01 (×2): 100 mg via ORAL
  Filled 2015-07-31 (×2): qty 1

## 2015-07-31 MED ORDER — SODIUM CHLORIDE 0.9% FLUSH
3.0000 mL | Freq: Two times a day (BID) | INTRAVENOUS | Status: DC
  Administered 2015-07-31: 3 mL via INTRAVENOUS

## 2015-07-31 MED ORDER — CYCLOSPORINE 0.05 % OP EMUL
1.0000 [drp] | Freq: Every day | OPHTHALMIC | Status: DC
  Administered 2015-07-31: 1 [drp] via OPHTHALMIC
  Filled 2015-07-31 (×2): qty 1

## 2015-07-31 MED ORDER — POTASSIUM CHLORIDE IN NACL 20-0.45 MEQ/L-% IV SOLN
INTRAVENOUS | Status: DC
  Administered 2015-07-31: 23:00:00 via INTRAVENOUS
  Filled 2015-07-31 (×3): qty 1000

## 2015-07-31 MED ORDER — BUDESONIDE 0.25 MG/2ML IN SUSP: 0.2500 mg | Freq: Two times a day (BID) | RESPIRATORY_TRACT | Status: DC

## 2015-07-31 MED ORDER — ALBUTEROL SULFATE (2.5 MG/3ML) 0.083% IN NEBU: 3.0000 mL | INHALATION_SOLUTION | RESPIRATORY_TRACT | Status: DC | PRN

## 2015-07-31 MED ORDER — ONDANSETRON HCL 4 MG/2ML IJ SOLN
INTRAMUSCULAR | Status: DC | PRN
  Administered 2015-07-31: 4 mg via INTRAVENOUS

## 2015-07-31 MED ORDER — METHOCARBAMOL 500 MG PO TABS
500.0000 mg | ORAL_TABLET | Freq: Four times a day (QID) | ORAL | Status: DC | PRN
  Filled 2015-07-31: qty 1

## 2015-07-31 MED ORDER — OXYCODONE-ACETAMINOPHEN 5-325 MG PO TABS
1.0000 | ORAL_TABLET | Freq: Four times a day (QID) | ORAL | Status: DC | PRN
  Administered 2015-08-01: 1 via ORAL
  Filled 2015-07-31: qty 1

## 2015-07-31 SURGICAL SUPPLY — 43 items
ADH SKN CLS APL DERMABOND .7 (GAUZE/BANDAGES/DRESSINGS) ×1
APL SKNCLS STERI-STRIP NONHPOA (GAUZE/BANDAGES/DRESSINGS) ×1
BENZOIN TINCTURE PRP APPL 2/3 (GAUZE/BANDAGES/DRESSINGS) ×2 IMPLANT
BUR ROUND FLUTED 4 SOFT TCH (BURR) ×2 IMPLANT
CANISTER SUCT 3000ML PPV (MISCELLANEOUS) ×2 IMPLANT
CLSR STERI-STRIP ANTIMIC 1/2X4 (GAUZE/BANDAGES/DRESSINGS) ×2 IMPLANT
COVER SURGICAL LIGHT HANDLE (MISCELLANEOUS) ×2 IMPLANT
DECANTER SPIKE VIAL GLASS SM (MISCELLANEOUS) ×2 IMPLANT
DERMABOND ADVANCED (GAUZE/BANDAGES/DRESSINGS) ×1
DERMABOND ADVANCED .7 DNX12 (GAUZE/BANDAGES/DRESSINGS) ×1 IMPLANT
DRAPE MICROSCOPE LEICA (MISCELLANEOUS) ×2 IMPLANT
DRAPE PROXIMA HALF (DRAPES) ×4 IMPLANT
DRAPE SURG 17X23 STRL (DRAPES) ×2 IMPLANT
DRSG MEPILEX BORDER 4X4 (GAUZE/BANDAGES/DRESSINGS) ×2 IMPLANT
DURAPREP 26ML APPLICATOR (WOUND CARE) ×2 IMPLANT
ELECT REM PT RETURN 9FT ADLT (ELECTROSURGICAL) ×2
ELECTRODE REM PT RTRN 9FT ADLT (ELECTROSURGICAL) ×1 IMPLANT
GLOVE BIOGEL PI IND STRL 8 (GLOVE) ×2 IMPLANT
GLOVE BIOGEL PI INDICATOR 8 (GLOVE) ×2
GLOVE ORTHO TXT STRL SZ7.5 (GLOVE) ×8 IMPLANT
GOWN STRL REUS W/ TWL LRG LVL3 (GOWN DISPOSABLE) ×2 IMPLANT
GOWN STRL REUS W/ TWL XL LVL3 (GOWN DISPOSABLE) ×1 IMPLANT
GOWN STRL REUS W/TWL 2XL LVL3 (GOWN DISPOSABLE) ×2 IMPLANT
GOWN STRL REUS W/TWL LRG LVL3 (GOWN DISPOSABLE) ×4
GOWN STRL REUS W/TWL XL LVL3 (GOWN DISPOSABLE) ×2
KIT BASIN OR (CUSTOM PROCEDURE TRAY) ×2 IMPLANT
KIT ROOM TURNOVER OR (KITS) ×2 IMPLANT
MANIFOLD NEPTUNE II (INSTRUMENTS) ×2 IMPLANT
NEEDLE HYPO 25GX1X1/2 BEV (NEEDLE) ×2 IMPLANT
NEEDLE SPNL 18GX3.5 QUINCKE PK (NEEDLE) ×2 IMPLANT
NS IRRIG 1000ML POUR BTL (IV SOLUTION) ×2 IMPLANT
PACK LAMINECTOMY ORTHO (CUSTOM PROCEDURE TRAY) ×2 IMPLANT
PAD ARMBOARD 7.5X6 YLW CONV (MISCELLANEOUS) ×4 IMPLANT
PATTIES SURGICAL .75X.75 (GAUZE/BANDAGES/DRESSINGS) IMPLANT
SUT VIC AB 0 CT1 27 (SUTURE) ×2
SUT VIC AB 0 CT1 27XBRD ANBCTR (SUTURE) ×1 IMPLANT
SUT VIC AB 1 CTX 36 (SUTURE) ×2
SUT VIC AB 1 CTX36XBRD ANBCTR (SUTURE) ×1 IMPLANT
SUT VIC AB 2-0 CT1 27 (SUTURE) ×2
SUT VIC AB 2-0 CT1 TAPERPNT 27 (SUTURE) ×1 IMPLANT
SUT VIC AB 3-0 X1 27 (SUTURE) ×2 IMPLANT
TOWEL OR 17X24 6PK STRL BLUE (TOWEL DISPOSABLE) ×2 IMPLANT
TOWEL OR 17X26 10 PK STRL BLUE (TOWEL DISPOSABLE) ×2 IMPLANT

## 2015-07-31 NOTE — Interval H&P Note (Signed)
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 After consideration of risks, benefits and other options for treatment, the patient has consented to  Procedure(s): L4-5 Decompression, Possible Right L4-5 Microdiscectomy (N/A) as a surgical intervention .  The patient's history has been reviewed, patient examined, no change in status, stable for surgery.  I have reviewed the patient's chart and labs.  Questions were answered to the patient's satisfaction.     YATES,MARK C

## 2015-07-31 NOTE — H&P (Signed)
Andrea Lowery is an 75 y.o. female.     The patient returns.  She states the pain is severe.  She has not gotten any relief from the injection.  She continues to have back pain, right buttock pain, right trochanteric pain, pain that radiates down the right leg down the top of her foot L5 distribution.  She does not always have the dorsal foot pain with it.  She has had MRI scan which shows moderately severe stenosis at the L4-5 level with broad based disk protrusion.  She has tried prednisone, trochanteric injections.  She has been on pain medication, tramadol, Tylenol 3.  She took Neurontin for a period of time, seem to help some short term then was not as effective.   The MRI scan was performed which shows moderately severe stenosis at L4-5 level with broad based disk protrusion and significant ligamentum thickening causing severe stenosis and bilateral lateral recess stenosis with mild foraminal encroachment on the right.  She is having more symptoms on the right than left.  She states the left really does not bother her much.  She has pain with standing.  She does better when she leans on a grocery cart.  Cannot walk to the store without using it.    PAST SURGICAL HISTORY:   Include gallbladder surgery.   FAMILY HISTORY:   Positive for diabetes, heart disease, asthma.   SOCIAL HISTORY:   The patient is divorced, retired.  Does not smoke.  Used to smoke half pack per day for about 20 years.  She quit in 2007.    REVIEW OF SYSTEMS:   A 14-point review of systems positive for acid reflux, anxiety, arthritis, asthma, bronchitis, heart disease, hypertension, migraines and thyroid condition.    Past Medical History  Diagnosis Date  . Asthma   . Hypertension   . OSA on CPAP   . Dyslipidemia   . Hypothyroidism   . LBBB (left bundle branch block)   . MGUS (monoclonal gammopathy of unknown significance)   . Arthritis   . MGUS (monoclonal gammopathy of unknown significance)   . Headache     Migraine  . Diabetes mellitus without complication ([place])     Diabetes II  . GERD (gastroesophageal reflux disease)   . DVT (deep venous thrombosis) ([place])     "Years ago"    Past Surgical History  Procedure Laterality Date  . Cholecystectomy  1976  . Tonsillectomy  1965  . Abdominal hysterectomy  1969  . Plantar fascia release    . US echocardiography  09/20/2008    borderline LVH,mild TR,AOV mildly sclerotic w/ca+ of the leaflets  . Nm myoview ltd  02/13/2010    No ischemia  . Artery biopsy N/A 11/07/2014    Procedure: BIOPSY TEMPORAL ARTERY;  Surgeon: Leta Baptist, MD;  Location: [facility] OR;  Service: ENT;  Laterality: N/A;  . Carpal tunnel release Right     Family History  Problem Relation Age of Onset  . Diabetes Father   . Heart failure Father   . Thyroid disease Sister    Social History:  reports that she quit smoking about 9 years ago. She has never used smokeless tobacco. She reports that she does not drink alcohol or use illicit drugs.  Allergies:  Allergies  Allergen Reactions  . Bee Venom Anaphylaxis    Yellow jackets  . Penicillins Hives    No prescriptions prior to admission    No results found for this or any previous visit (  from the past 48 hour(s)). No results found.  Review of Systems  Constitutional: Negative.   HENT: Negative.   Respiratory: Negative.   Cardiovascular: Negative.   Gastrointestinal: Negative.   Genitourinary: Negative.   Musculoskeletal: Positive for back pain.  Skin: Negative.     There were no vitals taken for this visit. Physical Exam  Constitutional: She is oriented to person, place, and time. No distress.  HENT:  Head: Atraumatic.  Eyes: EOM are normal.  Neck: Normal range of motion.  Respiratory: No respiratory distress.  GI: She exhibits no distension.  Musculoskeletal: She exhibits tenderness.  Neurological: She is alert and oriented to person, place, and time.  Skin: Skin is warm and dry.  Psychiatric: She has a normal  mood and affect.    PHYSICAL EXAMINATION:  The patient is 5 feet 7 inches, 198 pounds, alert and oriented.  She is in moderate distress.  Pulse is 73.  Blood pressure is 156/85.  Extraocular movements intact.  Good visual acuity with glasses.  No audible wheezing.  She has a normal pulse.  She has been diagnosed with bundle branch block.  No abdominal tenderness.  No pain with hip range of motion.  Positive straight leg raising on the right.  Positive popliteal compression test.  Normal heel-toe gait.  No sciatic notch tenderness on the left.  EHL, anterior tib is strong.  No gastroc soleus atrophy.  Distal pulses are 2+.  No pitting edema.  Skin over her back and legs are normal.   ASSESSMENT:  L4-5 severe stenosis, bilateral recess stenosis.   PLAN:  Plan for L4-5 decompression, possible microdiscectomy on the right versus surgery discussed.  The symptoms that she has correspond with spinal stenosis and some symptoms that she has that does not.  Plan would be for an overnight stay.  She needs someone to stay with her after the procedure.  We discussed she might be ambulatory with a walker for a short period of time.  Risks of dural tear.  We also discussed restenosis, potential for developing instability.  She states she has been told she has some osteoporosis.  Questions elicited and answered.  She understands and request to proceed.     Lanae Crumbly, PA-C 07/31/2015, 7:28 AM

## 2015-07-31 NOTE — Progress Notes (Signed)
ANTIBIOTIC CONSULT NOTE - INITIAL  Pharmacy Consult for vancomcyin Indication: surgical prophylaxis  Allergies  Allergen Reactions  . Bee Venom Anaphylaxis    Yellow jackets  . Penicillins Hives    Patient Measurements: Height: 5\' 6"  (167.6 cm) Weight: 197 lb (89.359 kg) IBW/kg (Calculated) : 59.3 Adjusted Body Weight:   Vital Signs: Temp: 97.8 F (36.6 C) (06/26 2201) Temp Source: Oral (06/26 2201) BP: 113/54 mmHg (06/26 2201) Pulse Rate: 61 (06/26 2201) Intake/Output from previous day:   Intake/Output from this shift: Total I/O In: 600 [I.V.:600] Out: 100 [Blood:100]  Labs: No results for input(s): WBC, HGB, PLT, LABCREA, CREATININE in the last 72 hours. Estimated Creatinine Clearance: 57 mL/min (by C-G formula based on Cr of 0.99). No results for input(s): VANCOTROUGH, VANCOPEAK, VANCORANDOM, GENTTROUGH, GENTPEAK, GENTRANDOM, TOBRATROUGH, TOBRAPEAK, TOBRARND, AMIKACINPEAK, AMIKACINTROU, AMIKACIN in the last 72 hours.   Microbiology: Recent Results (from the past 720 hour(s))  Surgical pcr screen     Status: None   Collection Time: 07/25/15  2:15 PM  Result Value Ref Range Status   MRSA, PCR NEGATIVE NEGATIVE Final   Staphylococcus aureus NEGATIVE NEGATIVE Final    Comment:        The Xpert SA Assay (FDA approved for NASAL specimens in patients over 80 years of age), is one component of a comprehensive surveillance program.  Test performance has been validated by [facility] for patients greater than or equal to 20 year old. It is not intended to diagnose infection nor to guide or monitor treatment.     Medical History: Past Medical History  Diagnosis Date  . Asthma   . Hypertension   . OSA on CPAP   . Dyslipidemia   . Hypothyroidism   . LBBB (left bundle branch block)   . MGUS (monoclonal gammopathy of unknown significance)   . Arthritis   . MGUS (monoclonal gammopathy of unknown significance)   . Headache     Migraine  . GERD  (gastroesophageal reflux disease)   . DVT (deep venous thrombosis) ([place])     "Years ago"  . Diabetes mellitus without complication ([place])     Diabetes II    Medications:  Scheduled:  . budesonide (PULMICORT) nebulizer solution  0.25 mg Nebulization BID  . cycloSPORINE  1 drop Both Eyes QHS  . docusate sodium  100 mg Oral BID  . [START ON 08/01/2015] furosemide  40 mg Oral Daily  . HYDROmorphone      . HYDROmorphone      . [START ON 08/01/2015] levothyroxine  50 mcg Oral Daily  . [START ON 08/01/2015] loratadine  10 mg Oral Daily  . [START ON 08/01/2015] metoprolol succinate  12.5 mg Oral Daily  . montelukast  10 mg Oral QHS  . olopatadine  1 drop Both Eyes BID  . [START ON 08/01/2015] pantoprazole  40 mg Oral Daily  . [START ON 08/01/2015] rosuvastatin  40 mg Oral Daily  . sodium chloride flush  3 mL Intravenous Q12H  . [START ON 08/01/2015] topiramate  50 mg Oral Daily  . [START ON 08/01/2015] valsartan-hydrochlorothiazide  1 tablet Oral Daily   Infusions:  . 0.45 % NaCl with KCl 20 mEq / L    . sodium chloride    . lactated ringers 10 mL/hr at 07/31/15 1354   Assessment: 75 yo female s/p surgery will be put on vancomycin for surgical prophylaxis.  Per RN, patient doesn't have a drain. Patient only got clindamycin pre-op.  Goal of Therapy:  Vancomycin  trough level 10-15 mcg/ml  Plan:  - vancomycin 1g iv x1 at midnight. Pharmacy will sign off  So, Tsz-Yin 07/31/2015,10:11 PM

## 2015-07-31 NOTE — Anesthesia Postprocedure Evaluation (Signed)
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  Procedure(s) Performed: Procedure(s) (LRB): L4-5 Decompression, Possible Right L4-5 Microdiscectomy (N/A)  Patient location during evaluation: PACU Anesthesia Type: General Level of consciousness: awake and alert Pain management: pain level controlled Vital Signs Assessment: post-procedure vital signs reviewed and stable Respiratory status: spontaneous breathing, nonlabored ventilation, respiratory function stable and patient connected to nasal cannula oxygen Cardiovascular status: blood pressure returned to baseline and stable Postop Assessment: no signs of nausea or vomiting Anesthetic complications: no    Last Vitals:  Filed Vitals:   07/31/15 2037 07/31/15 2052  BP: 94/38 97/51  Pulse: 60 61  Temp:  36.4 C  Resp: 16 14    Last Pain:  Filed Vitals:   07/31/15 2104  PainSc: 5                  FITZGERALD,W. EDMOND

## 2015-07-31 NOTE — Anesthesia Preprocedure Evaluation (Signed)
Anesthesia Evaluation  Patient identified by MRN, date of birth, ID band Patient awake    Reviewed: Allergy & Precautions, NPO status , Patient's Chart, lab work & pertinent test results  Airway Mallampati: I  TM Distance: >3 FB Neck ROM: Full    Dental  (+) Upper Dentures, Dental Advisory Given, Partial Lower, Lower Dentures   Pulmonary former smoker,    breath sounds clear to auscultation       Cardiovascular hypertension, Pt. on medications and Pt. on home beta blockers +CHF   Rhythm:Regular Rate:Normal     Neuro/Psych    GI/Hepatic   Endo/Other  diabetes, Well Controlled, Type 2, Oral Hypoglycemic AgentsMorbid obesity  Renal/GU      Musculoskeletal   Abdominal   Peds  Hematology   Anesthesia Other Findings Sleep study in chart shows no need for CPAP  Reproductive/Obstetrics                             Anesthesia Physical Anesthesia Plan  ASA: III  Anesthesia Plan: General   Post-op Pain Management:    Induction: Intravenous  Airway Management Planned: Oral ETT  Additional Equipment:   Intra-op Plan:   Post-operative Plan: Extubation in OR  Informed Consent: I have reviewed the patients History and Physical, chart, labs and discussed the procedure including the risks, benefits and alternatives for the proposed anesthesia with the patient or authorized representative who has indicated his/her understanding and acceptance.   Dental advisory given  Plan Discussed with: CRNA, Anesthesiologist and Surgeon  Anesthesia Plan Comments:         Anesthesia Quick Evaluation

## 2015-07-31 NOTE — Transfer of Care (Signed)
Immediate Anesthesia Transfer of Care Note  Patient: Andrea Lowery  Procedure(s) Performed: Procedure(s): L4-5 Decompression, Possible Right L4-5 Microdiscectomy (N/A)  Patient Location: PACU  Anesthesia Type:General  Level of Consciousness: awake  Airway & Oxygen Therapy: Patient Spontanous Breathing and Patient connected to nasal cannula oxygen  Post-op Assessment: Report given to RN and Post -op Vital signs reviewed and stable  Post vital signs: Reviewed and stable  Last Vitals:  Filed Vitals:   07/31/15 1952 07/31/15 1955  BP: 93/79 112/53  Pulse: 79 64  Temp: 36.8 C   Resp: 17 10    Last Pain:  Filed Vitals:   07/31/15 1959  PainSc: Asleep      Patients Stated Pain Goal: 3 ([id number])  Complications: No apparent anesthesia complications

## 2015-07-31 NOTE — Anesthesia Procedure Notes (Signed)
Procedure Name: Intubation Date/Time: 07/31/2015 6:22 PM Performed by: Barrington Ellison Pre-anesthesia Checklist: Patient identified, Emergency Drugs available, Suction available, Patient being monitored and Timeout performed Patient Re-evaluated:Patient Re-evaluated prior to inductionOxygen Delivery Method: Circle system utilized Preoxygenation: Pre-oxygenation with 100% oxygen Intubation Type: IV induction Ventilation: Mask ventilation without difficulty and Oral airway inserted - appropriate to patient size Laryngoscope Size: Mac and 3 Grade View: Grade I Tube type: Oral Tube size: 7.0 mm Number of attempts: 1 Airway Equipment and Method: Stylet Placement Confirmation: ETT inserted through vocal cords under direct vision,  positive ETCO2 and breath sounds checked- equal and bilateral Secured at: 20 cm Tube secured with: Tape Dental Injury: Teeth and Oropharynx as per pre-operative assessment

## 2015-07-31 NOTE — Interval H&P Note (Signed)
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 After consideration of risks, benefits and other options for treatment, the patient has consented to  Procedure(s): L4-5 Decompression, Possible Right L4-5 Microdiscectomy (N/A) as a surgical intervention .  The patient's history has been reviewed, patient examined, no change in status, stable for surgery.  I have reviewed the patient's chart and labs.  Questions were answered to the patient's satisfaction.     YATES,MARK C

## 2015-08-01 ENCOUNTER — Encounter (HOSPITAL_COMMUNITY): Payer: Self-pay | Admitting: Orthopaedic Surgery

## 2015-08-01 DIAGNOSIS — M4806 Spinal stenosis, lumbar region: Secondary | ICD-10-CM | POA: Diagnosis not present

## 2015-08-01 LAB — GLUCOSE, CAPILLARY: Glucose-Capillary: 139 mg/dL — ABNORMAL HIGH (ref 65–99)

## 2015-08-01 MED ORDER — ONDANSETRON HCL 4 MG PO TABS
4.0000 mg | ORAL_TABLET | Freq: Three times a day (TID) | ORAL | Status: DC | PRN
  Administered 2015-08-01: 4 mg via ORAL
  Filled 2015-08-01: qty 1

## 2015-08-01 MED ORDER — OXYCODONE-ACETAMINOPHEN 5-325 MG PO TABS: 1.0000 | ORAL_TABLET | Freq: Four times a day (QID) | ORAL | Status: DC | PRN

## 2015-08-01 NOTE — Progress Notes (Signed)
Pt c/o nausea. Verbal obtained for oral Zofran from [place].

## 2015-08-01 NOTE — Progress Notes (Signed)
Subjective: 1 Day Post-Op Procedure(s) (LRB): L4-5 Decompression, Possible Right L4-5 Microdiscectomy (N/A) Patient reports pain as mild and moderate.    Objective: Vital signs in last 24 hours: Temp:  [97.4 F (36.3 C)-98.4 F (36.9 C)] 97.9 F (36.6 C) (06/27 0506) Pulse Rate:  [54-79] 72 (06/27 0506) Resp:  [10-18] 15 (06/27 0506) BP: (93-177)/(38-81) 130/81 mmHg (06/27 0506) SpO2:  [95 %-100 %] 95 % (06/27 0506) Weight:  [89.359 kg (197 lb)] 89.359 kg (197 lb) (06/26 1325)  Intake/Output from previous day: 06/26 0701 - 06/27 0700 In: 2435.9 [P.O.:100; I.V.:2080.9; IV Piggyback:255] Out: 100 [Blood:100] Intake/Output this shift:    No results for input(s): HGB in the last 72 hours. No results for input(s): WBC, RBC, HCT, PLT in the last 72 hours. No results for input(s): NA, K, CL, CO2, BUN, CREATININE, GLUCOSE, CALCIUM in the last 72 hours. No results for input(s): LABPT, INR in the last 72 hours.  Neurologically intact  Assessment/Plan: 1 Day Post-Op Procedure(s) (LRB): L4-5 Decompression, Possible Right L4-5 Microdiscectomy (N/A) Up with therapy walking this AM then discharge Home.   YATES,MARK C 08/01/2015, 7:35 AM

## 2015-08-01 NOTE — Care Management Note (Addendum)
Case Management Note  Patient Details  Name: Andrea Lowery MRN: [id number] Date of Birth: 1940-08-31  Subjective/Objective:                 Patient fro hme, in obs for laminectomy/ spinal cord compression. Will DC to home today. DME ordered and to be delivered to room prior to DC. Patient denies any further CM resources.    Action/Plan:  DC to home Expected Discharge Date:                  Expected Discharge Plan:  Home/Self Care  In-House Referral:     Discharge planning Services  CM Consult  Post Acute Care Choice:  Durable Medical Equipment Choice offered to:  Patient  DME Arranged:  3-N-1, Walker rolling DME Agency:     HH Arranged:    HH Agency:     Status of Service:  Completed, signed off  If discussed at [employer], dates discussed:    Additional Comments:  Carles Collet, RN 08/01/2015, 10:55 AM

## 2015-08-01 NOTE — Progress Notes (Signed)
NURSING PROGRESS NOTE  Andrea Lowery [id number] Discharge Data: 08/01/2015 12:31 PM Attending Provider: Marybelle Killings, MD MIW:OEHOZ,[id number] M, [place]     [name] to be D/C'd Home per MD order.  Discussed with the patient the After Visit Summary and all questions fully answered. All IV's discontinued with no bleeding noted. All belongings returned to patient for patient to take home.   Last Vital Signs:  Blood pressure 130/81, pulse 72, temperature 97.9 F (36.6 C), temperature source Oral, resp. rate 15, height _0  (1.676 m), weight 89.359 kg (197 lb), SpO2 95 %.  Discharge Medication List   Medication List    STOP taking these medications        acetaminophen-codeine 300-30 MG tablet  Commonly known as:  TYLENOL #3      TAKE these medications        ACCU-CHEK AVIVA PLUS w/Device Kit  by Does not apply route.     albuterol 108 (90 Base) MCG/ACT inhaler  Commonly known as:  PROVENTIL HFA;VENTOLIN HFA  Inhale 2 puffs into the lungs every 4 (four) hours as needed for shortness of breath. For shortness of breath and wheezing     beclomethasone 80 MCG/ACT inhaler  Commonly known as:  QVAR  Inhale 2 puffs into the lungs 2 (two) times daily.     CALCIUM 600 + D PO  Take 2 tablets by mouth daily.     cetirizine 10 MG tablet  Commonly known as:  ZYRTEC  Take 10 mg by mouth daily.     cycloSPORINE 0.05 % ophthalmic emulsion  Commonly known as:  RESTASIS  Place 1 drop into both eyes at bedtime.     EPINEPHrine 0.3 mg/0.3 mL Soaj injection  Commonly known as:  EPI-PEN  Inject 0.3 mg into the muscle daily as needed (bee stings). Reported on 05/02/2015     esomeprazole 40 MG capsule  Commonly known as:  NEXIUM  Take 40 mg by mouth 2 (two) times daily.     fluticasone 50 MCG/ACT nasal spray  Commonly known as:  FLONASE  Place 2 sprays into both nostrils as needed for allergies.     FOSAMAX 70 MG tablet  Generic drug:  alendronate  Take 70 mg by mouth once a  week.     furosemide 20 MG tablet  Commonly known as:  LASIX  TAKE 2 TABLETS BY MOUTH ONCE DAILY     levothyroxine 50 MCG tablet  Commonly known as:  SYNTHROID, LEVOTHROID  Take 1 tablet (50 mcg total) by mouth daily.     metoprolol succinate 25 MG 24 hr tablet  Commonly known as:  TOPROL XL  Take 0.5 tablets (12.5 mg total) by mouth daily.     mometasone-formoterol 100-5 MCG/ACT Aero  Commonly known as:  DULERA  Inhale 2 puffs into the lungs as needed.     montelukast 10 MG tablet  Commonly known as:  SINGULAIR  Take 10 mg by mouth at bedtime.     oxyCODONE-acetaminophen 5-325 MG tablet  Commonly known as:  PERCOCET/ROXICET  Take 1-2 tablets by mouth every 6 (six) hours as needed for moderate pain.     PATADAY 0.2 % Soln  Generic drug:  Olopatadine HCl  Apply 1 drop to eye daily as needed (Allergies).     potassium chloride SA 20 MEQ tablet  Commonly known as:  K-DUR,KLOR-CON  Take 20 mEq by mouth daily.     rosuvastatin 40 MG tablet  Commonly known as:  CRESTOR  Take 1 tablet (40 mg total) by mouth daily.     topiramate 50 MG tablet  Commonly known as:  TOPAMAX  Take 50 mg by mouth daily. Patient takes prn     traZODone 50 MG tablet  Commonly known as:  DESYREL  Take 50 mg by mouth at bedtime as needed for sleep.     valsartan-hydrochlorothiazide 160-12.5 MG tablet  Commonly known as:  DIOVAN-HCT  Take 1 tablet by mouth daily.     VOLTAREN 1 % Gel  Generic drug:  diclofenac sodium  Apply 2 g topically daily as needed (Knee pain).

## 2015-08-01 NOTE — Op Note (Signed)
NAME:  Andrea Lowery, Andrea Lowery NO.:  [id number]  MEDICAL RECORD NO.:  [id number]  LOCATION:  [id number]                        FACILITY:  [facility]  PHYSICIAN:  Karla Pavone C. Lorin Mercy, M.D.    DATE OF BIRTH:  [birth year]  DATE OF PROCEDURE:  07/31/2015 DATE OF DISCHARGE:                              OPERATIVE REPORT   PREOPERATIVE DIAGNOSIS:  L4-L5 severe stenosis with right H and P.  POSTOPERATIVE DIAGNOSIS:  L4-L5 severe stenosis with right H and P.  PROCEDURE:  Central decompression L4-5 and right microdiskectomy with lateral recess decompression bilaterally.  SURGEON:  Adler Alton C. Lorin Mercy, M.D.  ASSISTANT:  Herbie Saxon, medically necessary and present for the entire procedure.  ESTIMATED BLOOD LOSS:  100 mL approximately.  ANESTHESIA:  General plus Marcaine local.  INDICATIONS:  A 75 year old female with severe stenosis and right L4-L5 HNP with right L5 radiculopathy persistent. Failed conservative treatment.  She had severe stenosis and consented to proceed with decompression and microdiskectomy.  DESCRIPTION OF PROCEDURE:  After induction of general anesthesia, orotracheal intubation, Ancef prophylaxis, the patient was placed on chest rolls prone.  Arms at 90-90, careful padding over the ulnar nerve. Rolled yellow pad underneath the shoulders.  Leg pumpers, back was prepped with DuraPrep.  The area was squared with towels, Betadine, Steri-Drape and laminectomy drape.  Time-out procedure completed. Needle localization spinal needle placed directly over the L4-5 disc based on palpable landmarks confirmed with the lateral x-ray.  Incision was made with midline subperiosteal dissection.  McCullough retractor was placed at the midline medium narrow blades and retracted out of the facet joints.  Two Kocher clamps were placed at the top and the bottom area of planned decompression and 2nd x-ray was taken.  This was immediately marked on the bone purple marker confirming this was  the planned level.  Posterior element was removed at L4.  Lamina was thinned with the bur.  The base of the spinous process was trimmed and smoothed and thinned down.  Microscope was draped and brought in 1 and 2-mm Kerrison's were used to remove the ventral aspect of the lamina.  There was thick chunks of ligament underneath and this continued until the cephalad portion of the lamina where there was epidural fat.  No areas of compression.  The patient had fairly narrow area of stenosis exactly at the large bulging disc with hypertrophic ligamentum.  Patties were used to protect the dura as well as the D'Errico flipped over and the [place].  Thick chunks of ligament were removed.  Once bone was removed out of the pedicle on the right side, disk was exposed it was bulging. Anulus was incised and using the D'Errico, some of the disk material actually could be squeezed out.  Straight micropituitary, upbiting micropituitary, straight regular pituitary, Epstein curettes were used to perform diskectomy removing chunks of ligament until the disk was flat.  Hockey stick could be placed anterior to the dura foramina was opened with palpation out with the hockey stick.  Lateral recess was decompressed.  Both sites were checked.  Lateral gutters were run to make sure there were no sharp spikes of bone and no remaining chunks of ligament present that would cause compression.  Dura was intact. Irrigation with saline solution.  #1 Vicryl in the deep fascia, 2-0 in the subcutaneous tissue subcuticular closure.  Dermabond on the skin. Postop dressing and transferred to the recovery room.  The patient tolerated the procedure well.     [name] C. Lorin Mercy, M.D.     MCY/MEDQ  D:  07/31/2015  T:  08/01/2015  Job:  [id number]

## 2015-08-01 NOTE — Care Management Obs Status (Signed)
[facility] NOTIFICATION   Patient Details  Name: MATHEW COSCARELLI MRN: [id number] Date of Birth: 1940/03/31   Medicare Observation Status Notification Given:  Yes    Carles Collet, RN 08/01/2015, 10:53 AM

## 2015-08-01 NOTE — Discharge Instructions (Signed)
OK to shower then reapply dressing so pants do not rub on incision. Walk daily, avoid sitting upright except for eating and bathroom use. See Dr. Lorin Mercy in one week.

## 2015-08-04 ENCOUNTER — Other Ambulatory Visit: Payer: Self-pay | Admitting: Endocrinology

## 2015-08-09 NOTE — Discharge Summary (Signed)
Patient ID: Andrea Lowery MRN: [id number] DOB/AGE: 1940-08-29 76 y.o.  Admit date: 07/31/2015 Discharge date: 08/09/2015  Admission Diagnoses:  Active Problems:   History of lumbar laminectomy for spinal cord decompression   Discharge Diagnoses:  Active Problems:   History of lumbar laminectomy for spinal cord decompression  status post Procedure(s): L4-5 Decompression, Possible Right L4-5 Microdiscectomy  Past Medical History  Diagnosis Date  . Asthma   . Hypertension   . OSA on CPAP   . Dyslipidemia   . Hypothyroidism   . LBBB (left bundle branch block)   . MGUS (monoclonal gammopathy of unknown significance)   . Arthritis   . MGUS (monoclonal gammopathy of unknown significance)   . Headache     Migraine  . GERD (gastroesophageal reflux disease)   . DVT (deep venous thrombosis) ([place])     "Years ago"  . Diabetes mellitus without complication ([place])     Diabetes II    Surgeries: Procedure(s): L4-5 Decompression, Possible Right L4-5 Microdiscectomy on 07/31/2015   Consultants:    Discharged Condition: Improved  Hospital Course: Andrea Lowery is an 75 y.o. female who was admitted 07/31/2015 for operative treatment of lumbar stenosis. Patient failed conservative treatments (please see the history and physical for the specifics) and had severe unremitting pain that affects sleep, daily activities and work/hobbies. After pre-op clearance, the patient was taken to the operating room on 07/31/2015 and underwent  Procedure(s): L4-5 Decompression, Possible Right L4-5 Microdiscectomy.    Patient was given perioperative antibiotics:  Anti-infectives    Start     Dose/Rate Route Frequency Ordered Stop   08/01/15 0000  vancomycin (VANCOCIN) IVPB 1000 mg/200 mL premix     1,000 mg 200 mL/hr over 60 Minutes Intravenous  Once 07/31/15 2214 08/01/15 0104   07/31/15 1500  clindamycin (CLEOCIN) IVPB 900 mg     900 mg 100 mL/hr over 30 Minutes Intravenous To ShortStay  Surgical 07/28/15 1413 07/31/15 1823       Patient was given sequential compression devices and early ambulation to prevent DVT.   Patient benefited maximally from hospital stay and there were no complications. At the time of discharge, the patient was urinating/moving their bowels without difficulty, tolerating a regular diet, pain is controlled with oral pain medications and they have been cleared by PT/OT.   Recent vital signs: No data found.    Recent laboratory studies: No results for input(s): WBC, HGB, HCT, PLT, NA, K, CL, CO2, BUN, CREATININE, GLUCOSE, INR, CALCIUM in the last 72 hours.  Invalid input(s): PT, 2   Discharge Medications:     Medication List    STOP taking these medications        acetaminophen-codeine 300-30 MG tablet  Commonly known as:  TYLENOL #3     levothyroxine 50 MCG tablet  Commonly known as:  SYNTHROID, LEVOTHROID      TAKE these medications        ACCU-CHEK AVIVA PLUS w/Device Kit  by Does not apply route.     albuterol 108 (90 Base) MCG/ACT inhaler  Commonly known as:  PROVENTIL HFA;VENTOLIN HFA  Inhale 2 puffs into the lungs every 4 (four) hours as needed for shortness of breath. For shortness of breath and wheezing     beclomethasone 80 MCG/ACT inhaler  Commonly known as:  QVAR  Inhale 2 puffs into the lungs 2 (two) times daily.     CALCIUM 600 + D PO  Take 2 tablets by mouth daily.  cetirizine 10 MG tablet  Commonly known as:  ZYRTEC  Take 10 mg by mouth daily.     cycloSPORINE 0.05 % ophthalmic emulsion  Commonly known as:  RESTASIS  Place 1 drop into both eyes at bedtime.     EPINEPHrine 0.3 mg/0.3 mL Soaj injection  Commonly known as:  EPI-PEN  Inject 0.3 mg into the muscle daily as needed (bee stings). Reported on 05/02/2015     esomeprazole 40 MG capsule  Commonly known as:  NEXIUM  Take 40 mg by mouth 2 (two) times daily.     fluticasone 50 MCG/ACT nasal spray  Commonly known as:  FLONASE  Place 2 sprays into  both nostrils as needed for allergies.     FOSAMAX 70 MG tablet  Generic drug:  alendronate  Take 70 mg by mouth once a week.     furosemide 20 MG tablet  Commonly known as:  LASIX  TAKE 2 TABLETS BY MOUTH ONCE DAILY     metoprolol succinate 25 MG 24 hr tablet  Commonly known as:  TOPROL XL  Take 0.5 tablets (12.5 mg total) by mouth daily.     mometasone-formoterol 100-5 MCG/ACT Aero  Commonly known as:  DULERA  Inhale 2 puffs into the lungs as needed.     montelukast 10 MG tablet  Commonly known as:  SINGULAIR  Take 10 mg by mouth at bedtime.     oxyCODONE-acetaminophen 5-325 MG tablet  Commonly known as:  PERCOCET/ROXICET  Take 1-2 tablets by mouth every 6 (six) hours as needed for moderate pain.     PATADAY 0.2 % Soln  Generic drug:  Olopatadine HCl  Apply 1 drop to eye daily as needed (Allergies).     potassium chloride SA 20 MEQ tablet  Commonly known as:  K-DUR,KLOR-CON  Take 20 mEq by mouth daily.     rosuvastatin 40 MG tablet  Commonly known as:  CRESTOR  Take 1 tablet (40 mg total) by mouth daily.     topiramate 50 MG tablet  Commonly known as:  TOPAMAX  Take 50 mg by mouth daily. Patient takes prn     traZODone 50 MG tablet  Commonly known as:  DESYREL  Take 50 mg by mouth at bedtime as needed for sleep.     valsartan-hydrochlorothiazide 160-12.5 MG tablet  Commonly known as:  DIOVAN-HCT  Take 1 tablet by mouth daily.     VOLTAREN 1 % Gel  Generic drug:  diclofenac sodium  Apply 2 g topically daily as needed (Knee pain).        Diagnostic Studies: Dg Chest 2 View  07/25/2015  CLINICAL DATA:  Preop imaging for lumbar stenosis.  Hypertension. EXAM: CHEST  2 VIEW COMPARISON:  01/30/2014 FINDINGS: Heart and mediastinal contours are within normal limits. No focal opacities or effusions. No acute bony abnormality. IMPRESSION: No active cardiopulmonary disease. Electronically Signed   By: Rolm Baptise M.D.   On: 07/25/2015 14:30   Dg Lumbar Spine 2-3  Views  07/31/2015  CLINICAL DATA:  Localizing images.  L4-5 decompression. EXAM: LUMBAR SPINE - 2-3 VIEW COMPARISON:  None. FINDINGS: Initial image shows localizer overlying the spinous process of L4 with tip directed towards the L4-5 disc space. Second image shows surgical hardware overlying the spinous processes of L4 and L5. IMPRESSION: Surgical hardware overlying the spinous processes of L4 and L5. Electronically Signed   By: Franki Cabot M.D.   On: 07/31/2015 18:52  Follow-up Information    Follow up with YATES,MARK C, MD In 1 week.   Specialty:  Orthopedic Surgery   Contact information:   [place] [address] [phone]       Follow up with [facility].   Why:  RW and 3in! to be delivered to room prior to [place] information:   [address] [place] [address] [phone]       Discharge Plan:  discharge to home  Disposition:     Signed: Lanae Crumbly 08/09/2015, 9:34 AM

## 2015-08-24 DIAGNOSIS — T63441D Toxic effect of venom of bees, accidental (unintentional), subsequent encounter: Secondary | ICD-10-CM | POA: Diagnosis not present

## 2015-08-25 ENCOUNTER — Ambulatory Visit (INDEPENDENT_AMBULATORY_CARE_PROVIDER_SITE_OTHER): Payer: Medicare HMO | Admitting: *Deleted

## 2015-08-25 DIAGNOSIS — J309 Allergic rhinitis, unspecified: Secondary | ICD-10-CM

## 2015-08-28 ENCOUNTER — Telehealth: Payer: Self-pay | Admitting: Allergy and Immunology

## 2015-08-28 ENCOUNTER — Ambulatory Visit (INDEPENDENT_AMBULATORY_CARE_PROVIDER_SITE_OTHER): Payer: Medicare HMO | Admitting: *Deleted

## 2015-08-28 DIAGNOSIS — T63441D Toxic effect of venom of bees, accidental (unintentional), subsequent encounter: Secondary | ICD-10-CM

## 2015-08-28 MED ORDER — EPINEPHRINE 0.3 MG/0.3ML IJ SOAJ: INTRAMUSCULAR | 1 refills | Status: DC

## 2015-08-28 NOTE — Telephone Encounter (Signed)
Sent in epipen for patient left message regarding refill.

## 2015-08-28 NOTE — Telephone Encounter (Signed)
Pt needs epi-pen called in to pharmacy

## 2015-09-03 ENCOUNTER — Other Ambulatory Visit: Payer: Self-pay | Admitting: Internal Medicine

## 2015-09-29 ENCOUNTER — Ambulatory Visit (INDEPENDENT_AMBULATORY_CARE_PROVIDER_SITE_OTHER): Payer: Medicare HMO

## 2015-09-29 DIAGNOSIS — J309 Allergic rhinitis, unspecified: Secondary | ICD-10-CM | POA: Diagnosis not present

## 2015-10-03 DIAGNOSIS — J3089 Other allergic rhinitis: Secondary | ICD-10-CM | POA: Diagnosis not present

## 2015-10-04 DIAGNOSIS — J301 Allergic rhinitis due to pollen: Secondary | ICD-10-CM | POA: Diagnosis not present

## 2015-10-21 ENCOUNTER — Encounter (HOSPITAL_COMMUNITY): Payer: Self-pay

## 2015-10-21 ENCOUNTER — Emergency Department (HOSPITAL_COMMUNITY): Payer: Medicare HMO

## 2015-10-21 ENCOUNTER — Emergency Department (HOSPITAL_COMMUNITY)
Admission: EM | Admit: 2015-10-21 | Discharge: 2015-10-21 | Disposition: A | Discharge status: Home / Self Care | Payer: Medicare HMO | Attending: Emergency Medicine | Admitting: Emergency Medicine

## 2015-10-21 DIAGNOSIS — E119 Type 2 diabetes mellitus without complications: Secondary | ICD-10-CM | POA: Diagnosis not present

## 2015-10-21 DIAGNOSIS — J45909 Unspecified asthma, uncomplicated: Secondary | ICD-10-CM | POA: Diagnosis not present

## 2015-10-21 DIAGNOSIS — Z79899 Other long term (current) drug therapy: Secondary | ICD-10-CM | POA: Insufficient documentation

## 2015-10-21 DIAGNOSIS — J069 Acute upper respiratory infection, unspecified: Secondary | ICD-10-CM | POA: Diagnosis not present

## 2015-10-21 DIAGNOSIS — I5031 Acute diastolic (congestive) heart failure: Secondary | ICD-10-CM | POA: Diagnosis not present

## 2015-10-21 DIAGNOSIS — R05 Cough: Secondary | ICD-10-CM | POA: Diagnosis present

## 2015-10-21 DIAGNOSIS — J0100 Acute maxillary sinusitis, unspecified: Secondary | ICD-10-CM | POA: Diagnosis not present

## 2015-10-21 DIAGNOSIS — I110 Hypertensive heart disease with heart failure: Secondary | ICD-10-CM | POA: Insufficient documentation

## 2015-10-21 DIAGNOSIS — H109 Unspecified conjunctivitis: Secondary | ICD-10-CM | POA: Diagnosis not present

## 2015-10-21 DIAGNOSIS — E039 Hypothyroidism, unspecified: Secondary | ICD-10-CM | POA: Diagnosis not present

## 2015-10-21 DIAGNOSIS — Z87891 Personal history of nicotine dependence: Secondary | ICD-10-CM | POA: Diagnosis not present

## 2015-10-21 LAB — URINALYSIS, ROUTINE W REFLEX MICROSCOPIC
Bilirubin Urine: NEGATIVE
Glucose, UA: NEGATIVE mg/dL
Hgb urine dipstick: NEGATIVE
Ketones, ur: NEGATIVE mg/dL
Leukocytes, UA: NEGATIVE
Nitrite: NEGATIVE
Protein, ur: NEGATIVE mg/dL
Specific Gravity, Urine: 1.006 (ref 1.005–1.030)
pH: 5.5 (ref 5.0–8.0)

## 2015-10-21 MED ORDER — PREDNISONE 20 MG PO TABS
40.0000 mg | ORAL_TABLET | Freq: Once | ORAL | Status: AC
  Administered 2015-10-21: 40 mg via ORAL
  Filled 2015-10-21: qty 2

## 2015-10-21 MED ORDER — CLINDAMYCIN HCL 150 MG PO CAPS
300.0000 mg | ORAL_CAPSULE | Freq: Once | ORAL | Status: AC
  Administered 2015-10-21: 300 mg via ORAL
  Filled 2015-10-21: qty 2

## 2015-10-21 MED ORDER — IPRATROPIUM-ALBUTEROL 0.5-2.5 (3) MG/3ML IN SOLN
3.0000 mL | Freq: Once | RESPIRATORY_TRACT | Status: AC
  Administered 2015-10-21: 3 mL via RESPIRATORY_TRACT
  Filled 2015-10-21: qty 3

## 2015-10-21 MED ORDER — TOBRAMYCIN 0.3 % OP SOLN: 2.0000 [drp] | Freq: Three times a day (TID) | OPHTHALMIC | 0 refills | Status: DC

## 2015-10-21 MED ORDER — CLINDAMYCIN HCL 150 MG PO CAPS: 150.0000 mg | ORAL_CAPSULE | Freq: Four times a day (QID) | ORAL | 0 refills | Status: DC

## 2015-10-21 MED ORDER — PREDNISONE 50 MG PO TABS: ORAL_TABLET | ORAL | 1 refills | Status: DC

## 2015-10-21 MED ORDER — TOBRAMYCIN-DEXAMETHASONE 0.3-0.1 % OP SUSP: 2.0000 [drp] | OPHTHALMIC | 0 refills | Status: DC

## 2015-10-21 NOTE — Discharge Instructions (Signed)
Chest x-ray showed no pneumonia. Tylenol for fever. Rinse eyes with top water and apply eyedrops.  Prescription for antibiotic and prednisone. Follow-up your primary care doctor

## 2015-10-21 NOTE — ED Triage Notes (Signed)
Patient here with cough, congestion and chills x 2 days, states that her cough is worse at night, using inhaler as prescribed, no distress. Also has dysuria x 2 days

## 2015-10-21 NOTE — ED Notes (Signed)
Pt verbalized understanding of d/c instructions and has no further questions. Pt stable and NAD.  

## 2015-10-21 NOTE — ED Provider Notes (Signed)
[facility] DEPT Provider Note   CSN: [id number] Arrival date & time: 10/21/15  1701     History   Chief Complaint Chief Complaint  Patient presents with  . Chills  . Cough    HPI Andrea Lowery is a 75 y.o. female.  Cough, draining eyes, maxillary sinus tenderness for 2 days with associated chills. She has been using her inhaler with minimal success. Patient has been eating. Questionable dysuria. Severity of symptoms is moderate.      Past Medical History:  Diagnosis Date  . Arthritis   . Asthma   . Diabetes mellitus without complication ([place])    Diabetes II  . DVT (deep venous thrombosis) ([place])    "Years ago"  . Dyslipidemia   . GERD (gastroesophageal reflux disease)   . Headache    Migraine  . Hypertension   . Hypothyroidism   . LBBB (left bundle branch block)   . MGUS (monoclonal gammopathy of unknown significance)   . MGUS (monoclonal gammopathy of unknown significance)   . OSA on CPAP     Patient Active Problem List   Diagnosis Date Noted  . History of lumbar laminectomy for spinal cord decompression 07/31/2015  . Chronic pain of right hip 05/02/2015  . Temporal arteritis ([place]) 11/07/2014  . Headache 11/07/2014  . Diabetes mellitus type 2, controlled ([place]) 11/07/2014  . Diastolic CHF ([place]) 48/25/0037  . Sacroiliac dysfunction 11/04/2014  . Trochanteric bursitis of right hip 11/04/2014  . Asthma 10/05/2014  . GERD (gastroesophageal reflux disease) 10/05/2014  . Allergic rhinitis 10/05/2014  . Multinodular goiter 08/09/2014  . Disorder of sacroiliac joint 07/29/2014  . 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 Hypersomnolence 04/16/2014  . Snoring 04/16/2014  . Preventive measure 01/25/2014  . Acute diastolic CHF (congestive heart failure), NYHA class 2 ([place]) 07/29/2013  . Bilateral leg edema 07/20/2013  . 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 Essential hypertension  07/01/2012  . LBBB (left bundle branch block) 07/01/2012  . DOE (dyspnea on exertion) 07/01/2012  . OSA on CPAP 07/01/2012    Past Surgical History:  Procedure Laterality Date  . ABDOMINAL HYSTERECTOMY  1969  . ARTERY BIOPSY N/A 11/07/2014   Procedure: BIOPSY TEMPORAL ARTERY;  Surgeon: Leta Baptist, MD;  Location: [facility];  Service: ENT;  Laterality: N/A;  . CARPAL TUNNEL RELEASE Right   . CHOLECYSTECTOMY  1976  . LUMBAR LAMINECTOMY/DECOMPRESSION MICRODISCECTOMY N/A 07/31/2015   Procedure: L4-5 Decompression, Possible Right L4-5 Microdiscectomy;  Surgeon: Marybelle Killings, MD;  Location: [facility];  Service: Orthopedics;  Laterality: N/A;  . NM MYOVIEW LTD  02/13/2010   No ischemia  . PLANTAR FASCIA RELEASE    . TONSILLECTOMY  1965  . US ECHOCARDIOGRAPHY  09/20/2008   borderline LVH,mild TR,AOV mildly sclerotic w/ca+ of the leaflets    OB History    No data available       Home Medications    Prior to Admission medications   Medication Sig Start Date End Date Taking? Authorizing Provider  albuterol (PROVENTIL HFA;VENTOLIN HFA) 108 (90 BASE) MCG/ACT inhaler Inhale 2 puffs into the lungs every 4 (four) hours as needed for wheezing or shortness of breath.    Yes Historical Provider, MD  alendronate (FOSAMAX) 70 MG tablet Take 70 mg by mouth every Wednesday.  05/30/15  Yes Historical Provider, MD  beclomethasone (QVAR) 80 MCG/ACT inhaler Inhale 2 puffs into the lungs 2 (two) times daily.   Yes Historical Provider, MD  Blood Glucose Monitoring Suppl (ACCU-CHEK AVIVA PLUS) w/Device KIT by Does not apply route. 05/12/14  Yes Historical Provider, MD  Calcium Carb-Cholecalciferol (CALCIUM 600 + D PO) Take 2 tablets by mouth daily.   Yes Historical Provider, MD  cetirizine (ZYRTEC) 10 MG tablet Take 10 mg by mouth daily.    Yes Historical Provider, MD  cycloSPORINE (RESTASIS) 0.05 % ophthalmic emulsion Place 1 drop into both eyes at bedtime.    Yes Historical Provider, MD  diclofenac sodium (VOLTAREN) 1 %  GEL Apply 2 g topically daily as needed (Knee pain).    Yes Historical Provider, MD  EPINEPHrine 0.3 mg/0.3 mL IJ SOAJ injection Use as directed for severe allergic reaction 08/28/15  Yes Jiles Prows, MD  esomeprazole (NEXIUM) 40 MG capsule Take 40 mg by mouth 2 (two) times daily.    Yes Historical Provider, MD  fluticasone (FLONASE) 50 MCG/ACT nasal spray Place 2 sprays into both nostrils as needed for allergies.  12/27/13  Yes Historical Provider, MD  furosemide (LASIX) 20 MG tablet take 2 tablet by mouth once daily Patient taking differently: take 2 tablets (40 mg) by mouth once daily 09/04/15  Yes Pixie Casino, MD  levothyroxine (SYNTHROID, LEVOTHROID) 50 MCG tablet take 1 tablet by mouth once daily Patient taking differently: take 1 tablet (50 mcg) by mouth once daily 08/04/15  Yes Renato Shin, MD  metoprolol succinate (TOPROL XL) 25 MG 24 hr tablet Take 0.5 tablets (12.5 mg total) by mouth daily. Patient taking differently: Take 25 mg by mouth daily.  07/29/13  Yes Pixie Casino, MD  mometasone-formoterol (DULERA) 100-5 MCG/ACT AERO Inhale 2 puffs into the lungs as needed for wheezing or shortness of breath.    Yes Historical Provider, MD  montelukast (SINGULAIR) 10 MG tablet Take 10 mg by mouth at bedtime.   Yes Historical Provider, MD  Olopatadine HCl (PATADAY) 0.2 % SOLN Apply 1 drop to eye daily as needed (Allergies).    Yes Historical Provider, MD  potassium chloride SA (K-DUR,KLOR-CON) 20 MEQ tablet Take 20 mEq by mouth 2 (two) times daily.  10/21/14  Yes Historical Provider, MD  rosuvastatin (CRESTOR) 40 MG tablet Take 1 tablet (40 mg total) by mouth daily. Patient taking differently: Take 40 mg by mouth at bedtime.  12/07/14  Yes Pixie Casino, MD  topiramate (TOPAMAX) 50 MG tablet Take 50 mg by mouth daily as needed (for migraines).    Yes Historical Provider, MD  traZODone (DESYREL) 50 MG tablet Take 50 mg by mouth at bedtime as needed for sleep.  06/14/15  Yes Historical  Provider, MD  valsartan-hydrochlorothiazide (DIOVAN-HCT) 160-12.5 MG per tablet Take 1 tablet by mouth daily.   Yes Historical Provider, MD  clindamycin (CLEOCIN) 150 MG capsule Take 1 capsule (150 mg total) by mouth every 6 (six) hours. 10/21/15   Nat Christen, MD  predniSONE (DELTASONE) 50 MG tablet 1 tablet daily for 5 days 10/21/15   Nat Christen, MD  tobramycin (TOBREX) 0.3 % ophthalmic solution Place 2 drops into both eyes 3 (three) times daily. 10/21/15   Nat Christen, MD    Family History Family History  Problem Relation Age of Onset  . Diabetes Father   . Heart failure Father   . Thyroid disease Sister     Social History Social History  Substance Use Topics  . Smoking status: Former Smoker    Quit date: 02/04/2006  . Smokeless tobacco: Never Used  . Alcohol use No     Allergies  Bee venom; Penicillins; and Oxycodone   Review of Systems Review of Systems  All other systems reviewed and are negative.    Physical Exam Updated Vital Signs BP 141/99   Pulse 85   Temp 97.9 F (36.6 C) (Oral)   Resp 16   Ht _0  (1.676 m)   Wt 190 lb (86.2 kg)   SpO2 96%   BMI 30.67 kg/m   Physical Exam  Constitutional: She is oriented to person, place, and time. She appears well-developed and well-nourished.  Coughing, no acute distress  HENT:  Head: Normocephalic and atraumatic.  Tender over maxillary sinuses bilaterally  Eyes:  Mucous discharge on conjunctiva  Neck: Neck supple.  Cardiovascular: Normal rate and regular rhythm.   Pulmonary/Chest: Effort normal and breath sounds normal.  Abdominal: Soft. Bowel sounds are normal.  Musculoskeletal: Normal range of motion.  Neurological: She is alert and oriented to person, place, and time.  Skin: Skin is warm and dry.  Psychiatric: She has a normal mood and affect. Her behavior is normal.  Nursing note and vitals reviewed.    ED Treatments / Results  Labs (all labs ordered are listed, but only abnormal results are  displayed) Labs Reviewed  URINALYSIS, ROUTINE W REFLEX MICROSCOPIC (NOT AT [facility]) - Abnormal; Notable for the following:       Result Value   APPearance CLOUDY (*)    All other components within normal limits    EKG  EKG Interpretation None       Radiology Dg Chest 2 View  Result Date: 10/21/2015 CLINICAL DATA:  Cough EXAM: CHEST  2 VIEW COMPARISON:  07/25/2015 chest radiograph. FINDINGS: Stable cardiomediastinal silhouette with normal heart size and aortic arch atherosclerosis. No pneumothorax. No pleural effusion. Mildly hyperinflated lungs. No pulmonary edema. No acute consolidative airspace disease. Cholecystectomy clips are seen in the right upper quadrant of the abdomen. IMPRESSION: 1. No acute cardiopulmonary disease. 2. Mildly hyperinflated lungs, which may indicate COPD. 3. Aortic atherosclerosis. Electronically Signed   By: Ilona Sorrel M.D.   On: 10/21/2015 18:25    Procedures Procedures (including critical care time)  Medications Ordered in ED Medications  clindamycin (CLEOCIN) capsule 300 mg (not administered)  ipratropium-albuterol (DUONEB) 0.5-2.5 (3) MG/3ML nebulizer solution 3 mL (3 mLs Nebulization Given 10/21/15 2205)  predniSONE (DELTASONE) tablet 40 mg (40 mg Oral Given 10/21/15 2205)     Initial Impression / Assessment and Plan / ED Course  I have reviewed the triage vital signs and the nursing notes.  Pertinent labs & imaging results that were available during my care of the patient were reviewed by me and considered in my medical decision making (see chart for details).  Clinical Course    Patient is stable. Will Rx clindamycin for sinusitis, tobramycin ophthalmic  Final Clinical Impressions(s) / ED Diagnoses   Final diagnoses:  URI (upper respiratory infection)  Acute maxillary sinusitis, recurrence not specified  Bilateral conjunctivitis    New Prescriptions New Prescriptions   CLINDAMYCIN (CLEOCIN) 150 MG CAPSULE    Take 1 capsule (150 mg  total) by mouth every 6 (six) hours.   PREDNISONE (DELTASONE) 50 MG TABLET    1 tablet daily for 5 days   TOBRAMYCIN (TOBREX) 0.3 % OPHTHALMIC SOLUTION    Place 2 drops into both eyes 3 (three) times daily.     Nat Christen, MD 10/21/15 [phone]

## 2015-11-01 ENCOUNTER — Ambulatory Visit (INDEPENDENT_AMBULATORY_CARE_PROVIDER_SITE_OTHER): Payer: Medicare HMO | Admitting: *Deleted

## 2015-11-01 DIAGNOSIS — J309 Allergic rhinitis, unspecified: Secondary | ICD-10-CM | POA: Diagnosis not present

## 2015-11-16 ENCOUNTER — Ambulatory Visit (INDEPENDENT_AMBULATORY_CARE_PROVIDER_SITE_OTHER): Payer: Medicare HMO | Admitting: *Deleted

## 2015-11-16 DIAGNOSIS — T63441D Toxic effect of venom of bees, accidental (unintentional), subsequent encounter: Secondary | ICD-10-CM | POA: Diagnosis not present

## 2015-11-20 DIAGNOSIS — L219 Seborrheic dermatitis, unspecified: Secondary | ICD-10-CM | POA: Insufficient documentation

## 2015-11-20 DIAGNOSIS — Z91199 Patient's noncompliance with other medical treatment and regimen due to unspecified reason: Secondary | ICD-10-CM | POA: Insufficient documentation

## 2015-11-20 DIAGNOSIS — R600 Localized edema: Secondary | ICD-10-CM | POA: Insufficient documentation

## 2015-11-20 DIAGNOSIS — Z9119 Patient's noncompliance with other medical treatment and regimen: Secondary | ICD-10-CM | POA: Insufficient documentation

## 2015-11-20 DIAGNOSIS — H16223 Keratoconjunctivitis sicca, not specified as Sjogren's, bilateral: Secondary | ICD-10-CM | POA: Insufficient documentation

## 2015-11-21 ENCOUNTER — Other Ambulatory Visit: Payer: Self-pay | Admitting: Internal Medicine

## 2015-11-21 NOTE — Telephone Encounter (Signed)
Rx has been sent to the pharmacy electronically. ° °

## 2015-11-28 ENCOUNTER — Ambulatory Visit (INDEPENDENT_AMBULATORY_CARE_PROVIDER_SITE_OTHER): Payer: Medicare HMO

## 2015-11-28 DIAGNOSIS — J309 Allergic rhinitis, unspecified: Secondary | ICD-10-CM

## 2015-12-07 ENCOUNTER — Ambulatory Visit (INDEPENDENT_AMBULATORY_CARE_PROVIDER_SITE_OTHER): Payer: Medicare HMO | Admitting: *Deleted

## 2015-12-07 DIAGNOSIS — J309 Allergic rhinitis, unspecified: Secondary | ICD-10-CM

## 2015-12-12 ENCOUNTER — Ambulatory Visit (INDEPENDENT_AMBULATORY_CARE_PROVIDER_SITE_OTHER): Payer: Medicare HMO

## 2015-12-12 DIAGNOSIS — J309 Allergic rhinitis, unspecified: Secondary | ICD-10-CM | POA: Diagnosis not present

## 2015-12-18 ENCOUNTER — Ambulatory Visit (INDEPENDENT_AMBULATORY_CARE_PROVIDER_SITE_OTHER): Payer: Medicare HMO | Admitting: *Deleted

## 2015-12-18 DIAGNOSIS — J309 Allergic rhinitis, unspecified: Secondary | ICD-10-CM

## 2015-12-25 ENCOUNTER — Ambulatory Visit (INDEPENDENT_AMBULATORY_CARE_PROVIDER_SITE_OTHER): Payer: Medicare HMO | Admitting: *Deleted

## 2015-12-25 DIAGNOSIS — J309 Allergic rhinitis, unspecified: Secondary | ICD-10-CM | POA: Diagnosis not present

## 2015-12-29 ENCOUNTER — Encounter (HOSPITAL_COMMUNITY): Payer: Self-pay | Admitting: Nurse Practitioner

## 2015-12-29 ENCOUNTER — Emergency Department (HOSPITAL_COMMUNITY): Payer: Medicare HMO

## 2015-12-29 ENCOUNTER — Emergency Department (HOSPITAL_COMMUNITY)
Admission: EM | Admit: 2015-12-29 | Discharge: 2015-12-29 | Disposition: A | Discharge status: Home / Self Care | Payer: Medicare HMO | Attending: Emergency Medicine | Admitting: Emergency Medicine

## 2015-12-29 DIAGNOSIS — E119 Type 2 diabetes mellitus without complications: Secondary | ICD-10-CM | POA: Insufficient documentation

## 2015-12-29 DIAGNOSIS — J45901 Unspecified asthma with (acute) exacerbation: Secondary | ICD-10-CM | POA: Insufficient documentation

## 2015-12-29 DIAGNOSIS — I110 Hypertensive heart disease with heart failure: Secondary | ICD-10-CM | POA: Diagnosis not present

## 2015-12-29 DIAGNOSIS — E039 Hypothyroidism, unspecified: Secondary | ICD-10-CM | POA: Diagnosis not present

## 2015-12-29 DIAGNOSIS — Z87891 Personal history of nicotine dependence: Secondary | ICD-10-CM | POA: Insufficient documentation

## 2015-12-29 DIAGNOSIS — R0602 Shortness of breath: Secondary | ICD-10-CM | POA: Diagnosis present

## 2015-12-29 DIAGNOSIS — I5032 Chronic diastolic (congestive) heart failure: Secondary | ICD-10-CM | POA: Insufficient documentation

## 2015-12-29 HISTORY — DX: Migraine, unspecified, not intractable, without status migrainosus: G43.909

## 2015-12-29 LAB — CBC WITH DIFFERENTIAL/PLATELET
Basophils Absolute: 0 10*3/uL (ref 0.0–0.1)
Basophils Relative: 0 %
Eosinophils Absolute: 0.2 10*3/uL (ref 0.0–0.7)
Eosinophils Relative: 5 %
HCT: 34.7 % — ABNORMAL LOW (ref 36.0–46.0)
Hemoglobin: 11.6 g/dL — ABNORMAL LOW (ref 12.0–15.0)
Lymphocytes Relative: 31 %
Lymphs Abs: 1.7 10*3/uL (ref 0.7–4.0)
MCH: 27.4 pg (ref 26.0–34.0)
MCHC: 33.4 g/dL (ref 30.0–36.0)
MCV: 82 fL (ref 78.0–100.0)
Monocytes Absolute: 0.6 10*3/uL (ref 0.1–1.0)
Monocytes Relative: 11 %
Neutro Abs: 2.8 10*3/uL (ref 1.7–7.7)
Neutrophils Relative %: 53 %
Platelets: 172 10*3/uL (ref 150–400)
RBC: 4.23 MIL/uL (ref 3.87–5.11)
RDW: 15.8 % — ABNORMAL HIGH (ref 11.5–15.5)
WBC: 5.4 10*3/uL (ref 4.0–10.5)

## 2015-12-29 LAB — BASIC METABOLIC PANEL
Anion gap: 8 (ref 5–15)
BUN: 6 mg/dL (ref 6–20)
CO2: 26 mmol/L (ref 22–32)
Calcium: 9 mg/dL (ref 8.9–10.3)
Chloride: 105 mmol/L (ref 101–111)
Creatinine, Ser: 0.74 mg/dL (ref 0.44–1.00)
GFR calc Af Amer: >60 mL/min (ref >60)
GFR calc non Af Amer: >60 mL/min (ref >60)
Glucose, Bld: 145 mg/dL — ABNORMAL HIGH (ref 65–99)
Potassium: 3.5 mmol/L (ref 3.5–5.1)
Sodium: 139 mmol/L (ref 135–145)

## 2015-12-29 LAB — I-STAT TROPONIN, ED: Troponin i, poc: 0 ng/mL (ref 0.00–0.08)

## 2015-12-29 LAB — BRAIN NATRIURETIC PEPTIDE: B Natriuretic Peptide: 53.8 pg/mL (ref 0.0–100.0)

## 2015-12-29 MED ORDER — PREDNISONE 10 MG PO TABS: 40.0000 mg | ORAL_TABLET | Freq: Every day | ORAL | 0 refills | Status: AC

## 2015-12-29 MED ORDER — IPRATROPIUM-ALBUTEROL 0.5-2.5 (3) MG/3ML IN SOLN
3.0000 mL | Freq: Once | RESPIRATORY_TRACT | Status: AC
  Administered 2015-12-29: 3 mL via RESPIRATORY_TRACT
  Filled 2015-12-29: qty 3

## 2015-12-29 MED ORDER — PREDNISONE 20 MG PO TABS
60.0000 mg | ORAL_TABLET | Freq: Once | ORAL | Status: AC
  Administered 2015-12-29: 60 mg via ORAL
  Filled 2015-12-29: qty 3

## 2015-12-29 MED ORDER — BENZONATATE 100 MG PO CAPS: 100.0000 mg | ORAL_CAPSULE | Freq: Three times a day (TID) | ORAL | 0 refills | Status: DC

## 2015-12-29 MED ORDER — ALBUTEROL SULFATE (2.5 MG/3ML) 0.083% IN NEBU: 5.0000 mg | INHALATION_SOLUTION | Freq: Once | RESPIRATORY_TRACT | Status: DC

## 2015-12-29 NOTE — ED Triage Notes (Addendum)
Pt presents with c/o SOB. The smyptoms began yesterday. She reports fevers, headaches, cough, wheezing. She tried her nebulizer, inhalers, singulair, delsym, topamax, vicks vapor rub with no relief.

## 2015-12-29 NOTE — ED Provider Notes (Signed)
The patient is a 75 year old female, she is very well-appearing and presents with a complaint of shortness of breath that started yesterday. She reports that she is coughing and wheezing, she has been using her home medications including pro-air, Dulera, Singulair, has not missed any medications, was last on prednisone in September. The patient denies any chest pain to me, denies any new swelling of the legs but does have chronic swelling of her lower extremities. She denies a history of congestive heart failure or myocardial infarction.  On exam the patient is obese in full sentences without any accessory muscle use, no increased work of breathing, clear lung sounds without wheezing rhonchi or rales. She has bilateral mild swelling to the lower extremities with minimal pitting, mostly just obesity. Oropharynx is clear and moist, cardiac rhythm is normal without any ectopy, murmurs rubs or gallops. EKG shows a left bundle branch block comparable to prior left bundle branch block on EKG.  We'll obtain labs and an EKG to make sure this is not another source or shortness of breath though this could just be an asthma exacerbation.  I saw and evaluated the patient, reviewed the resident's note and I agree with the findings and plan.   EKG Interpretation  Date/Time:  Friday December 29 2015 13:54:54 EST Ventricular Rate:  75 PR Interval:  182 QRS Duration: 140 QT Interval:  418 QTC Calculation: 466 R Axis:   105 Text Interpretation:  Normal sinus rhythm Rightward axis Non-specific intra-ventricular conduction block Abnormal ECG Since last tracing ST segments different, LBBB underlying this Confirmed by MILLER  MD, BRIAN ([id number]) on 12/29/2015 8:00:50 PM       I personally interpreted the EKG as well as the resident and agree with the interpretation on the resident's chart.  Final diagnoses:  Mild asthma with exacerbation, unspecified whether persistent      Noemi Chapel, MD 12/30/15 1435

## 2015-12-29 NOTE — ED Provider Notes (Signed)
[facility] DEPT Provider Note   CSN: [id number] Arrival date & time: 12/29/15  1334     History   Chief Complaint Chief Complaint  Patient presents with  . Shortness of Breath    HPI Andrea Lowery is a 75 y.o. female.  HPI Patient is a 75 year old female past medical history of asthma, diabetes who presents with shortness of breath since yesterday evening. Patient reports she began feeling short of breath while walking yesterday evening and was wheezing all last night. Symptoms are worse with exertion. She has no orthopnea. She has had a dry cough for the past 4 days, no sputum production. Denies fevers, chills, chest pain. She has chronic lower extremity swelling that is unchanged; she has been told in the past that this is due to obesity. Has been taking her home inhalers as prescribed with little relief.  Past Medical History:  Diagnosis Date  . Arthritis   . Asthma   . Diabetes mellitus without complication ([place])    Diabetes II  . DVT (deep venous thrombosis) ([place])    "Years ago"  . Dyslipidemia   . GERD (gastroesophageal reflux disease)   . Headache    Migraine  . Hypertension   . Hypothyroidism   . LBBB (left bundle branch block)   . MGUS (monoclonal gammopathy of unknown significance)   . MGUS (monoclonal gammopathy of unknown significance)   . Migraine   . OSA on CPAP     Patient Active Problem List   Diagnosis Date Noted  . History of lumbar laminectomy for spinal cord decompression 07/31/2015  . Chronic pain of right hip 05/02/2015  . Temporal arteritis ([place]) 11/07/2014  . Headache 11/07/2014  . Diabetes mellitus type 2, controlled ([place]) 11/07/2014  . Diastolic CHF ([place]) 73/41/9379  . Sacroiliac dysfunction 11/04/2014  . Trochanteric bursitis of right hip 11/04/2014  . Asthma 10/05/2014  . GERD (gastroesophageal reflux disease) 10/05/2014  . Allergic rhinitis 10/05/2014  . Multinodular goiter 08/09/2014  . Disorder of sacroiliac joint  07/29/2014  . 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 Hypersomnolence 04/16/2014  . Snoring 04/16/2014  . Preventive measure 01/25/2014  . Acute diastolic CHF (congestive heart failure), NYHA class 2 ([place]) 07/29/2013  . Bilateral leg edema 07/20/2013  . 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 Essential hypertension 07/01/2012  . LBBB (left bundle branch block) 07/01/2012  . DOE (dyspnea on exertion) 07/01/2012  . OSA on CPAP 07/01/2012    Past Surgical History:  Procedure Laterality Date  . ABDOMINAL HYSTERECTOMY  1969  . ARTERY BIOPSY N/A 11/07/2014   Procedure: BIOPSY TEMPORAL ARTERY;  Surgeon: Leta Baptist, MD;  Location: [facility];  Service: ENT;  Laterality: N/A;  . CARPAL TUNNEL RELEASE Right   . CHOLECYSTECTOMY  1976  . LUMBAR LAMINECTOMY/DECOMPRESSION MICRODISCECTOMY N/A 07/31/2015   Procedure: L4-5 Decompression, Possible Right L4-5 Microdiscectomy;  Surgeon: Marybelle Killings, MD;  Location: [facility];  Service: Orthopedics;  Laterality: N/A;  . NM MYOVIEW LTD  02/13/2010   No ischemia  . PLANTAR FASCIA RELEASE    . TONSILLECTOMY  1965  . US ECHOCARDIOGRAPHY  09/20/2008   borderline LVH,mild TR,AOV mildly sclerotic w/ca+ of the leaflets    OB History    No data available       Home Medications    Prior to Admission medications   Medication Sig Start Date End Date Taking? Authorizing Provider  albuterol (PROVENTIL HFA;VENTOLIN HFA) 108 (90 BASE) MCG/ACT inhaler Inhale  2 puffs into the lungs every 4 (four) hours as needed for wheezing or shortness of breath.     Historical Provider, MD  alendronate (FOSAMAX) 70 MG tablet Take 70 mg by mouth every Wednesday.  05/30/15   Historical Provider, MD  beclomethasone (QVAR) 80 MCG/ACT inhaler Inhale 2 puffs into the lungs 2 (two) times daily.    Historical Provider, MD  benzonatate (TESSALON) 100 MG capsule Take 1 capsule (100 mg total) by mouth every 8 (eight) hours. 12/29/15    Gibson Ramp, MD  Blood Glucose Monitoring Suppl (ACCU-CHEK AVIVA PLUS) w/Device KIT by Does not apply route. 05/12/14   Historical Provider, MD  Calcium Carb-Cholecalciferol (CALCIUM 600 + D PO) Take 2 tablets by mouth daily.    Historical Provider, MD  cetirizine (ZYRTEC) 10 MG tablet Take 10 mg by mouth daily.     Historical Provider, MD  clindamycin (CLEOCIN) 150 MG capsule Take 1 capsule (150 mg total) by mouth every 6 (six) hours. 10/21/15   Nat Christen, MD  clindamycin (CLEOCIN) 150 MG capsule Take 1 capsule (150 mg total) by mouth every 6 (six) hours. 10/21/15   Nat Christen, MD  cycloSPORINE (RESTASIS) 0.05 % ophthalmic emulsion Place 1 drop into both eyes at bedtime.     Historical Provider, MD  diclofenac sodium (VOLTAREN) 1 % GEL Apply 2 g topically daily as needed (Knee pain).     Historical Provider, MD  EPINEPHrine 0.3 mg/0.3 mL IJ SOAJ injection Use as directed for severe allergic reaction 08/28/15   Jiles Prows, MD  esomeprazole (NEXIUM) 40 MG capsule Take 40 mg by mouth 2 (two) times daily.     Historical Provider, MD  fluticasone (FLONASE) 50 MCG/ACT nasal spray Place 2 sprays into both nostrils as needed for allergies.  12/27/13   Historical Provider, MD  furosemide (LASIX) 20 MG tablet take 2 tablet by mouth once daily Patient taking differently: take 2 tablets (40 mg) by mouth once daily 09/04/15   Pixie Casino, MD  levothyroxine (SYNTHROID, LEVOTHROID) 50 MCG tablet take 1 tablet by mouth once daily Patient taking differently: take 1 tablet (50 mcg) by mouth once daily 08/04/15   Renato Shin, MD  metoprolol succinate (TOPROL XL) 25 MG 24 hr tablet Take 0.5 tablets (12.5 mg total) by mouth daily. Patient taking differently: Take 25 mg by mouth daily.  07/29/13   Pixie Casino, MD  mometasone-formoterol (DULERA) 100-5 MCG/ACT AERO Inhale 2 puffs into the lungs as needed for wheezing or shortness of breath.     Historical Provider, MD  montelukast (SINGULAIR) 10 MG tablet Take 10 mg  by mouth at bedtime.    Historical Provider, MD  Olopatadine HCl (PATADAY) 0.2 % SOLN Apply 1 drop to eye daily as needed (Allergies).     Historical Provider, MD  potassium chloride SA (K-DUR,KLOR-CON) 20 MEQ tablet Take 20 mEq by mouth 2 (two) times daily.  10/21/14   Historical Provider, MD  predniSONE (DELTASONE) 10 MG tablet Take 4 tablets (40 mg total) by mouth daily. 12/30/15 01/03/16  Gibson Ramp, MD  rosuvastatin (CRESTOR) 40 MG tablet Take 1 tablet (40 mg total) by mouth daily. Need appointment before anymore refills 11/21/15   Pixie Casino, MD  tobramycin (TOBREX) 0.3 % ophthalmic solution Place 2 drops into both eyes 3 (three) times daily. 10/21/15   Nat Christen, MD  tobramycin-dexamethasone [facility]) ophthalmic solution Place 2 drops into both eyes every 4 (four) hours while awake. 10/21/15   Aaron Edelman  Lacinda Axon, MD  topiramate (TOPAMAX) 50 MG tablet Take 50 mg by mouth daily as needed (for migraines).     Historical Provider, MD  traZODone (DESYREL) 50 MG tablet Take 50 mg by mouth at bedtime as needed for sleep.  06/14/15   Historical Provider, MD  valsartan-hydrochlorothiazide (DIOVAN-HCT) 160-12.5 MG per tablet Take 1 tablet by mouth daily.    Historical Provider, MD    Family History Family History  Problem Relation Age of Onset  . Diabetes Father   . Heart failure Father   . Thyroid disease Sister     Social History Social History  Substance Use Topics  . Smoking status: Former Smoker    Quit date: 02/04/2006  . Smokeless tobacco: Never Used  . Alcohol use No     Allergies   Bee venom; Penicillins; and Oxycodone   Review of Systems Review of Systems  Constitutional: Negative for chills and fever.  HENT: Negative for ear pain and sore throat.   Eyes: Negative for pain and visual disturbance.  Respiratory: Positive for shortness of breath and wheezing. Negative for cough.   Cardiovascular: Negative for chest pain and palpitations.  Gastrointestinal: Negative for  abdominal pain and vomiting.  Genitourinary: Negative for dysuria and hematuria.  Musculoskeletal: Negative for arthralgias and back pain.  Skin: Negative for color change and rash.  Neurological: Negative for seizures and syncope.  All other systems reviewed and are negative.    Physical Exam Updated Vital Signs BP 153/73 (BP Location: Right Arm)   Pulse 74   Temp 97.7 F (36.5 C) (Oral)   Resp 22   SpO2 96%   Physical Exam  Constitutional: She appears well-developed and well-nourished. No distress.  HENT:  Head: Normocephalic and atraumatic.  Eyes: Conjunctivae are normal.  Neck: Neck supple.  Cardiovascular: Normal rate and regular rhythm.   No murmur heard. Pulmonary/Chest: Effort normal. No respiratory distress. She has wheezes (scattered, faint end expiratory wheezes). She has no rales. She exhibits no tenderness.  Abdominal: Soft. She exhibits no distension. There is no tenderness.  Musculoskeletal: She exhibits edema (bilateral lower extremity edema).  Neurological: She is alert.  Skin: Skin is warm and dry. Capillary refill takes less than 2 seconds.  Psychiatric: She has a normal mood and affect.  Nursing note and vitals reviewed.    ED Treatments / Results  Labs (all labs ordered are listed, but only abnormal results are displayed) Labs Reviewed  BASIC METABOLIC PANEL - Abnormal; Notable for the following:       Result Value   Glucose, Bld 145 (*)    All other components within normal limits  CBC WITH DIFFERENTIAL/PLATELET - Abnormal; Notable for the following:    Hemoglobin 11.6 (*)    HCT 34.7 (*)    RDW 15.8 (*)    All other components within normal limits  BRAIN NATRIURETIC PEPTIDE  I-STAT TROPOININ, ED    EKG  EKG Interpretation  Date/Time:  Friday December 29 2015 13:54:54 EST Ventricular Rate:  75 PR Interval:  182 QRS Duration: 140 QT Interval:  418 QTC Calculation: 466 R Axis:   105 Text Interpretation:  Normal sinus rhythm Rightward  axis Non-specific intra-ventricular conduction block Abnormal ECG Since last tracing ST segments different, LBBB underlying this Confirmed by MILLER  MD, BRIAN ([id number]) on 12/29/2015 8:00:50 PM       Radiology Dg Chest 2 View  Result Date: 12/29/2015 CLINICAL DATA:  Shortness of breath and cough EXAM: CHEST  2 VIEW COMPARISON:  October 21, 2015 FINDINGS: There is no edema or consolidation. Heart is upper normal in size with pulmonary vascularity within normal limits. No adenopathy. There is atherosclerotic calcification in the aorta. There is degenerative change in the thoracic spine. IMPRESSION: No edema or consolidation.  Aortic atherosclerosis. Electronically Signed   By: Lowella Grip III M.D.   On: 12/29/2015 14:43    Procedures Procedures (including critical care time)  Medications Ordered in ED Medications  ipratropium-albuterol (DUONEB) 0.5-2.5 (3) MG/3ML nebulizer solution 3 mL (3 mLs Nebulization Given 12/29/15 2043)  predniSONE (DELTASONE) tablet 60 mg (60 mg Oral Given 12/29/15 2042)     Initial Impression / Assessment and Plan / ED Course  I have reviewed the triage vital signs and the nursing notes.  Pertinent labs & imaging results that were available during my care of the patient were reviewed by me and considered in my medical decision making (see chart for details).  Clinical Course     Patient is a 75 year old female with past medical history as above who presents with increased shortness of breath and wheezing concerning for asthma exacerbation. Afebrile. She is not hypoxic or tachycardic on arrival. No increased work of breathing on exam. She has scattered expiratory wheezes. DuoNeb and by mouth steroids given. EKG obtained and shows known left bundle branch block but does not meet [place] criteria. Chest x-ray negative for effusion, pneumonia or other acute abnormalities. Labs w/ mild anemia, similar to prior, otherwise unremarkable. Troponin and BNP normal.  On reassessment after symptomatic treatment, patient reports she feels significantly improved. No wheezes on repeat auscultation. Patient is tolerating by mouth and ambulating without difficulty. Likely asthma exacerbation secondary to URI vs weather changes. Discharged in stable condition. Prescription for steroid burst and Tessalon Perles given. Follow-up with primary care doctor next week for reevaluation. Strict return precautions given. Patient in agreement with plan.  Seen and discussed with Dr. Sabra Heck, ED attending  Final Clinical Impressions(s) / ED Diagnoses   Final diagnoses:  Mild asthma with exacerbation, unspecified whether persistent    New Prescriptions Discharge Medication List as of 12/29/2015 10:31 PM    START taking these medications   Details  benzonatate (TESSALON) 100 MG capsule Take 1 capsule (100 mg total) by mouth every 8 (eight) hours., Starting Fri 12/29/2015, Print         Gibson Ramp, MD 12/30/15 [id number]    Noemi Chapel, MD 12/30/15 1435

## 2016-01-02 ENCOUNTER — Ambulatory Visit (INDEPENDENT_AMBULATORY_CARE_PROVIDER_SITE_OTHER): Payer: Medicare HMO | Admitting: *Deleted

## 2016-01-02 DIAGNOSIS — T63441D Toxic effect of venom of bees, accidental (unintentional), subsequent encounter: Secondary | ICD-10-CM | POA: Diagnosis not present

## 2016-01-10 ENCOUNTER — Ambulatory Visit: Payer: Medicare HMO | Admitting: Internal Medicine

## 2016-01-10 DIAGNOSIS — J45901 Unspecified asthma with (acute) exacerbation: Secondary | ICD-10-CM | POA: Insufficient documentation

## 2016-01-10 DIAGNOSIS — J45909 Unspecified asthma, uncomplicated: Secondary | ICD-10-CM | POA: Insufficient documentation

## 2016-01-10 HISTORY — DX: Unspecified asthma, uncomplicated: J45.909

## 2016-01-15 ENCOUNTER — Ambulatory Visit (INDEPENDENT_AMBULATORY_CARE_PROVIDER_SITE_OTHER): Payer: Medicare HMO | Admitting: Orthopaedic Surgery

## 2016-01-15 ENCOUNTER — Ambulatory Visit (INDEPENDENT_AMBULATORY_CARE_PROVIDER_SITE_OTHER): Payer: Medicare HMO

## 2016-01-15 ENCOUNTER — Encounter (INDEPENDENT_AMBULATORY_CARE_PROVIDER_SITE_OTHER): Payer: Self-pay | Admitting: Orthopaedic Surgery

## 2016-01-15 DIAGNOSIS — M25512 Pain in left shoulder: Secondary | ICD-10-CM | POA: Diagnosis not present

## 2016-01-15 MED ORDER — HYDROCODONE-ACETAMINOPHEN 5-325 MG PO TABS: 1.0000 | ORAL_TABLET | Freq: Two times a day (BID) | ORAL | 0 refills | Status: DC | PRN

## 2016-01-15 MED ORDER — PREDNISONE 10 MG (21) PO TBPK: 10.0000 mg | ORAL_TABLET | Freq: Every day | ORAL | 0 refills | Status: DC

## 2016-01-15 NOTE — Progress Notes (Signed)
Office Visit Note   Patient: Andrea Lowery           Date of Birth: Nov 25, 1940           MRN: [id number] Visit Date: 01/15/2016              Requested by: Darden Amber, [address] [place], [place] [address] PCP: Darden Amber, PA   Assessment & Plan: Visit Diagnoses:  1. Acute pain of left shoulder     Plan: Impression is left shoulder arthritis exacerbation. Prednisone taper was prescribed. Hydrocodone also given today. Follow-up as needed.  Follow-Up Instructions: Return if symptoms worsen or fail to improve.   Orders:  Orders Placed This Encounter  Procedures  . XR Shoulder Left   Meds ordered this encounter  Medications  . predniSONE (STERAPRED UNI-PAK 21 TAB) 10 MG (21) TBPK tablet    Sig: Take 1 tablet (10 mg total) by mouth daily. Take as directed    Dispense:  21 tablet    Refill:  0  . HYDROcodone-acetaminophen (NORCO) 5-325 MG tablet    Sig: Take 1 tablet by mouth 2 (two) times daily as needed.    Dispense:  14 tablet    Refill:  0      Procedures: No procedures performed   Clinical Data: No additional findings.   Subjective: Chief Complaint  Patient presents with  . Left Shoulder - Pain    Is a 75 year old female with left shoulder pains last night. She woke up with pain and stiffness worsening pain and movement of the shoulder. The pain radiates up the shoulder. She denies any neck or radicular symptoms. Denies any trauma.    Review of Systems  Constitutional: Negative.   HENT: Negative.   Eyes: Negative.   Respiratory: Negative.   Cardiovascular: Negative.   Endocrine: Negative.   Musculoskeletal: Negative.   Neurological: Negative.   Hematological: Negative.   Psychiatric/Behavioral: Negative.   All other systems reviewed and are negative.    Objective: Vital Signs: There were no vitals taken for this visit.  Physical Exam  Constitutional: She is oriented to person, place, and time. She appears  well-developed and well-nourished.  HENT:  Head: Atraumatic.  Eyes: EOM are normal.  Neck: Neck supple.  Cardiovascular: Intact distal pulses.   Pulmonary/Chest: Effort normal.  Abdominal: Soft.  Neurological: She is alert and oriented to person, place, and time.  Skin: Skin is warm. Capillary refill takes less than 2 seconds.  Psychiatric: She has a normal mood and affect. Her behavior is normal. Judgment and thought content normal.  Nursing note and vitals reviewed.   Ortho Exam Exam of the left shoulder shows no skin lesions or rashes or swelling. She has pain with just slight range of motion even by the side of her body. She has no focal findings with the rotator cuff. Acromioclavicular joint is nontender. Her rotator cuff strength is somewhat limited by pain. Specialty Comments:  No specialty comments available.  Imaging: Xr Shoulder Left  Result Date: 01/15/2016 No acute bony abnormalities.    PMFS History: Patient Active Problem List   Diagnosis Date Noted  . History of lumbar laminectomy for spinal cord decompression 07/31/2015  . Chronic pain of right hip 05/02/2015  . Temporal arteritis ([place]) 11/07/2014  . Headache 11/07/2014  . Diabetes mellitus type 2, controlled ([place]) 11/07/2014  . Diastolic CHF ([place]) [id number]  . Sacroiliac dysfunction 11/04/2014  . Trochanteric bursitis of right hip 11/04/2014  . Asthma  10/05/2014  . GERD (gastroesophageal reflux disease) 10/05/2014  . Allergic rhinitis 10/05/2014  . Multinodular goiter 08/09/2014  . Disorder of sacroiliac joint 07/29/2014  . 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 Hypersomnolence 04/16/2014  . Snoring 04/16/2014  . Preventive measure 01/25/2014  . Acute diastolic CHF (congestive heart failure), NYHA class 2 ([place]) 07/29/2013  . Bilateral leg edema 07/20/2013  . 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 Essential hypertension 07/01/2012    . LBBB (left bundle branch block) 07/01/2012  . DOE (dyspnea on exertion) 07/01/2012  . OSA on CPAP 07/01/2012   Past Medical History:  Diagnosis Date  . Arthritis   . Asthma   . Diabetes mellitus without complication ([place])    Diabetes II  . DVT (deep venous thrombosis) ([place])    "Years ago"  . Dyslipidemia   . GERD (gastroesophageal reflux disease)   . Headache    Migraine  . Hypertension   . Hypothyroidism   . LBBB (left bundle branch block)   . MGUS (monoclonal gammopathy of unknown significance)   . MGUS (monoclonal gammopathy of unknown significance)   . Migraine   . OSA on CPAP     Family History  Problem Relation Age of Onset  . Diabetes Father   . Heart failure Father   . Thyroid disease Sister     Past Surgical History:  Procedure Laterality Date  . ABDOMINAL HYSTERECTOMY  1969  . ARTERY BIOPSY N/A 11/07/2014   Procedure: BIOPSY TEMPORAL ARTERY;  Surgeon: Leta Baptist, MD;  Location: [facility];  Service: ENT;  Laterality: N/A;  . CARPAL TUNNEL RELEASE Right   . CHOLECYSTECTOMY  1976  . LUMBAR LAMINECTOMY/DECOMPRESSION MICRODISCECTOMY N/A 07/31/2015   Procedure: L4-5 Decompression, Possible Right L4-5 Microdiscectomy;  Surgeon: Marybelle Killings, MD;  Location: [facility];  Service: Orthopedics;  Laterality: N/A;  . NM MYOVIEW LTD  02/13/2010   No ischemia  . PLANTAR FASCIA RELEASE    . TONSILLECTOMY  1965  . US ECHOCARDIOGRAPHY  09/20/2008   borderline LVH,mild TR,AOV mildly sclerotic w/ca+ of the leaflets   Social History   Occupational History  . Not on file.   Social History Main Topics  . Smoking status: Former Smoker    Quit date: 02/04/2006  . Smokeless tobacco: Never Used  . Alcohol use No  . Drug use: No  . Sexual activity: No

## 2016-01-16 ENCOUNTER — Telehealth (INDEPENDENT_AMBULATORY_CARE_PROVIDER_SITE_OTHER): Payer: Self-pay | Admitting: Orthopaedic Surgery

## 2016-01-16 NOTE — Telephone Encounter (Signed)
Pt wanting to ask questions about her arm. Says she is suffering. Stated she had surgery a couple months ago. Pt had x-rays done yesterday. She wants to ask a couple of questions as she is in a lot of pain. Pt callback number is [phone]

## 2016-01-18 ENCOUNTER — Ambulatory Visit (INDEPENDENT_AMBULATORY_CARE_PROVIDER_SITE_OTHER): Payer: Medicare HMO | Admitting: Internal Medicine

## 2016-01-18 ENCOUNTER — Encounter: Payer: Self-pay | Admitting: Internal Medicine

## 2016-01-18 VITALS — BP 167/94 | HR 76 | Ht 66.0 in | Wt 197.4 lb

## 2016-01-18 DIAGNOSIS — Z9989 Dependence on other enabling machines and devices: Secondary | ICD-10-CM

## 2016-01-18 DIAGNOSIS — Z79899 Other long term (current) drug therapy: Secondary | ICD-10-CM | POA: Diagnosis not present

## 2016-01-18 DIAGNOSIS — I779 Disorder of arteries and arterioles, unspecified: Secondary | ICD-10-CM | POA: Diagnosis not present

## 2016-01-18 DIAGNOSIS — G4733 Obstructive sleep apnea (adult) (pediatric): Secondary | ICD-10-CM

## 2016-01-18 DIAGNOSIS — I447 Left bundle-branch block, unspecified: Secondary | ICD-10-CM

## 2016-01-18 DIAGNOSIS — E785 Hyperlipidemia, unspecified: Secondary | ICD-10-CM | POA: Diagnosis not present

## 2016-01-18 DIAGNOSIS — I739 Peripheral vascular disease, unspecified: Principal | ICD-10-CM

## 2016-01-18 MED ORDER — VALSARTAN-HYDROCHLOROTHIAZIDE 320-12.5 MG PO TABS: 1.0000 | ORAL_TABLET | Freq: Every day | ORAL | 5 refills | Status: DC

## 2016-01-18 NOTE — Patient Instructions (Signed)
Your physician has recommended you make the following change in your medication,...  1. STOP furosemide (lasix) 2. INCREASE valsartan-hydrochlorothiazide to 320-12.5mg  daily  Your physician recommends that you return for lab work FASTING   Your physician has requested that you have a carotid duplex. This test is an ultrasound of the carotid arteries in your neck. It looks at blood flow through these arteries that supply the brain with blood. Allow one hour for this exam. There are no restrictions or special instructions.  Your physician recommends that you schedule a follow-up appointment in: [place] with Dr. Debara Pickett

## 2016-01-19 DIAGNOSIS — E785 Hyperlipidemia, unspecified: Secondary | ICD-10-CM | POA: Insufficient documentation

## 2016-01-19 DIAGNOSIS — I739 Peripheral vascular disease, unspecified: Secondary | ICD-10-CM

## 2016-01-19 DIAGNOSIS — I779 Disorder of arteries and arterioles, unspecified: Secondary | ICD-10-CM | POA: Insufficient documentation

## 2016-01-19 NOTE — Progress Notes (Signed)
OFFICE NOTE  Chief Complaint:  Routine follow-up  Primary Care Physician: Darden Amber, PA  HPI:  Andrea Lowery is a 75 year old female, history of some lower extremity edema, asthma, seasonal allergies, and a left bundle branch block. There was no evidence for ischemia based on stress testing in 2012. The main issue today is she was told recently that she was having worsening problems with snoring and witnessed episodes of apnea at night. She says her sleep is poor. She often feels fatigued throughout the day. Occasionally wakes up with headaches in the morning. Apparently, she underwent a sleep study in 2008 or so and that was negative; however, her weight was about 150 to 160 at the time. Since she has stopped smoking and gained about 40 pounds. I was concerned about sleep apnea and sent her for a sleep study which demonstrated significant sleep apnea. She was fitted with a mask and is doing well.  I have also readjusted her medications, switching her to Diovan HCTZ and decreasing her amlodipine, which has since been discontinued totally. She also recently underwent extensive dental work by Dr. Buelah Manis.  This included moderate sedation and was without complication.  She returns today feeling fairly well. EKG continues to show persistent left bundle branch block.  Andrea Lowery returns today for followup appointment. She was just seen by Cecilie Kicks, nurse practitioner, in our office for hospital followup. She presented to the hospital with shortness of breath and was told she was in congestive heart failure. A chest x-ray showed borderline cardiomegaly with early interstitial edema. A BNP was only mildly elevated at 170. She was told she had significant heart failure and there was concern about her persistent left bundle branch block which is well-documented and is nonischemic. She was discharged and in followup was given additional Lasix and lost 2 pounds. She reports her  breathing is back to normal although she still has persistent leg swelling. She is not good at reducing salt in her diet in fact eats a good amount of it. In addition, she is not on beta blocker.  A repeat echocardiogram was performed today, and demonstrated preserved systolic function with an EF of 55-60%, no significant valvular disease, and diastolic dysfunction.  I saw Andrea Lowery back today in the office. Her main complaint still is about ankle swelling. She has nonpitting edema mostly over the lateral malleolus bilaterally. Seems to be slightly worse in the right than the left. She denies any worsening shortness of breath or chest pain. She tells me that her primary care provider decrease her antihypertensive medications for unknown reasons, specifically valsartan was cut in half. She continues to have mild hypokalemia on low-dose repletion. She also was switched to pravastatin but is interested in going back to Crestor.  Andrea Lowery returns today for hospital follow-up. He was recently hospitalized and had mild diastolic heart failure exacerbation. In addition she was having headaches and thought to have possible temporal arteritis, however a temporal biopsy was negative. Subsequently she has been given additional medication for headaches. From a cardiology standpoint, she appears to be quite euvolemic on her current dose of diuretics, which had increased her to most recently. She reports some swelling around the right and left lateral malleoli, which does not appear to be edema.  01/19/2016  Andrea Lowery returns today for follow-up. Since I last saw her she reports no worsening shortness of breath or chest pain. She does have bilateral carotid artery disease which would require follow-up. Blood pressure  is noted to be elevated today 167/94. For some reason her valsartan and HCTZ was decreased from the 320/12.5 mg dose to the 160/12.5 mg dose. She is also on Lasix. She denies any significant  lower extremity edema.  PMHx:  Past Medical History:  Diagnosis Date  . Arthritis   . Asthma   . Diabetes mellitus without complication ([place])    Diabetes II  . DVT (deep venous thrombosis) ([place])    "Years ago"  . Dyslipidemia   . GERD (gastroesophageal reflux disease)   . Headache    Migraine  . Hypertension   . Hypothyroidism   . LBBB (left bundle branch block)   . MGUS (monoclonal gammopathy of unknown significance)   . MGUS (monoclonal gammopathy of unknown significance)   . Migraine   . OSA on CPAP     Past Surgical History:  Procedure Laterality Date  . ABDOMINAL HYSTERECTOMY  1969  . ARTERY BIOPSY N/A 11/07/2014   Procedure: BIOPSY TEMPORAL ARTERY;  Surgeon: Leta Baptist, MD;  Location: [facility];  Service: ENT;  Laterality: N/A;  . CARPAL TUNNEL RELEASE Right   . CHOLECYSTECTOMY  1976  . LUMBAR LAMINECTOMY/DECOMPRESSION MICRODISCECTOMY N/A 07/31/2015   Procedure: L4-5 Decompression, Possible Right L4-5 Microdiscectomy;  Surgeon: Marybelle Killings, MD;  Location: [facility];  Service: Orthopedics;  Laterality: N/A;  . NM MYOVIEW LTD  02/13/2010   No ischemia  . PLANTAR FASCIA RELEASE    . TONSILLECTOMY  1965  . US ECHOCARDIOGRAPHY  09/20/2008   borderline LVH,mild TR,AOV mildly sclerotic w/ca+ of the leaflets    FAMHx:  Family History  Problem Relation Age of Onset  . Diabetes Father   . Heart failure Father   . Thyroid disease Sister     SOCHx:   reports that she quit smoking about 9 years ago. She has never used smokeless tobacco. She reports that she does not drink alcohol or use drugs.  ALLERGIES:  Allergies  Allergen Reactions  . Bee Venom Anaphylaxis    Yellow jackets  . Penicillins Hives    Has patient had a PCN reaction causing immediate rash, facial/tongue/throat swelling, SOB or lightheadedness with hypotension: Yes Has patient had a PCN reaction causing severe rash involving mucus membranes or skin necrosis: No Has patient had a PCN reaction that required  hospitalization No Has patient had a PCN reaction occurring within the last 10 years: No If all of the above answers are "NO", then may proceed with Cephalosporin use.   Marland Kitchen Oxycodone Other (See Comments)    Hallucinations   ROS: A comprehensive review of systems was negative.  HOME MEDS: Current Outpatient Prescriptions  Medication Sig Dispense Refill  . albuterol (PROVENTIL HFA;VENTOLIN HFA) 108 (90 BASE) MCG/ACT inhaler Inhale 2 puffs into the lungs every 4 (four) hours as needed for wheezing or shortness of breath.     Marland Kitchen alendronate (FOSAMAX) 70 MG tablet Take 70 mg by mouth every Wednesday.     . beclomethasone (QVAR) 80 MCG/ACT inhaler Inhale 2 puffs into the lungs 2 (two) times daily.    . benzonatate (TESSALON) 100 MG capsule Take 1 capsule (100 mg total) by mouth every 8 (eight) hours. 21 capsule 0  . Blood Glucose Monitoring Suppl (ACCU-CHEK AVIVA PLUS) w/Device KIT by Does not apply route.    . Calcium Carb-Cholecalciferol (CALCIUM 600 + D PO) Take 2 tablets by mouth daily.    . cetirizine (ZYRTEC) 10 MG tablet Take 10 mg by mouth daily.     Marland Kitchen  clindamycin (CLEOCIN) 150 MG capsule Take 1 capsule (150 mg total) by mouth every 6 (six) hours. 28 capsule 0  . clindamycin (CLEOCIN) 150 MG capsule Take 1 capsule (150 mg total) by mouth every 6 (six) hours. 28 capsule 0  . cycloSPORINE (RESTASIS) 0.05 % ophthalmic emulsion Place 1 drop into both eyes at bedtime.     . diclofenac sodium (VOLTAREN) 1 % GEL Apply 2 g topically daily as needed (Knee pain).     Marland Kitchen EPINEPHrine 0.3 mg/0.3 mL IJ SOAJ injection Use as directed for severe allergic reaction 2 Device 1  . esomeprazole (NEXIUM) 40 MG capsule Take 40 mg by mouth 2 (two) times daily.     . fluticasone (FLONASE) 50 MCG/ACT nasal spray Place 2 sprays into both nostrils as needed for allergies.     Marland Kitchen HYDROcodone-acetaminophen (NORCO) 5-325 MG tablet Take 1 tablet by mouth 2 (two) times daily as needed. 14 tablet 0  . levothyroxine  (SYNTHROID, LEVOTHROID) 50 MCG tablet take 1 tablet by mouth once daily (Patient taking differently: take 1 tablet (50 mcg) by mouth once daily) 90 tablet 0  . metoprolol succinate (TOPROL XL) 25 MG 24 hr tablet Take 0.5 tablets (12.5 mg total) by mouth daily. (Patient taking differently: Take 25 mg by mouth daily. ) 15 tablet 6  . mometasone-formoterol (DULERA) 100-5 MCG/ACT AERO Inhale 2 puffs into the lungs as needed for wheezing or shortness of breath.     . montelukast (SINGULAIR) 10 MG tablet Take 10 mg by mouth at bedtime.    . Olopatadine HCl (PATADAY) 0.2 % SOLN Apply 1 drop to eye daily as needed (Allergies).     . potassium chloride SA (K-DUR,KLOR-CON) 20 MEQ tablet Take 20 mEq by mouth 2 (two) times daily.     . predniSONE (STERAPRED UNI-PAK 21 TAB) 10 MG (21) TBPK tablet Take 1 tablet (10 mg total) by mouth daily. Take as directed 21 tablet 0  . rosuvastatin (CRESTOR) 40 MG tablet Take 1 tablet (40 mg total) by mouth daily. Need appointment before anymore refills 90 tablet 0  . tobramycin (TOBREX) 0.3 % ophthalmic solution Place 2 drops into both eyes 3 (three) times daily. 5 mL 0  . tobramycin-dexamethasone (TOBRADEX) ophthalmic solution Place 2 drops into both eyes every 4 (four) hours while awake. 5 mL 0  . topiramate (TOPAMAX) 50 MG tablet Take 50 mg by mouth daily as needed (for migraines).     . traZODone (DESYREL) 50 MG tablet Take 50 mg by mouth at bedtime as needed for sleep.     . valsartan-hydrochlorothiazide (DIOVAN-HCT) 320-12.5 MG tablet Take 1 tablet by mouth daily. 30 tablet 5   No current facility-administered medications for this visit.     LABS/IMAGING: No results found for this or any previous visit (from the past 48 hour(s)). No results found.  VITALS: BP (!) 167/94   Pulse 76   Ht 5' 6"  (1.676 m)   Wt 197 lb 6.4 oz (89.5 kg)   BMI 31.86 kg/m   EXAM: General appearance: alert and no distress Neck: no adenopathy, no carotid bruit, no JVD, supple,  symmetrical, trachea midline and thyroid not enlarged, symmetric, no tenderness/mass/nodules Lungs: clear to auscultation bilaterally Heart: regular rate and rhythm, S1, S2 normal and systolic murmur: early systolic 2/6, crescendo at 2nd left intercostal space Abdomen: soft, non-tender; bowel sounds normal; no masses,  no organomegaly Extremities: extremities normal, atraumatic, no cyanosis or edema and Fatty deposition over both lateral malleolus Pulses: 2+ and  symmetric Skin: Skin color, texture, turgor normal. No rashes or lesions Neurologic: Grossly normal  EKG: Deferred  ASSESSMENT: 1. Left bundle branch block 2. Hypertension 3. Mild dyspnea on exertion 4. Obstructive sleep apnea on CPAP 5. Diastolic dysfunction with recent mild acute congestive heart failure, EF 55-60% 6. Moderate bilateral carotid artery disease 7. Lipidemia at the ankles  PLAN: 1.   Mrs. Wallner is doing fairly well. 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 She's been giving increasing doses of diuretics for this however her BNP remains very low around 50. There is no pitting edema that is appreciated. I advised her that there are few options to manage this other than plastic surgery for liposuction. This would be considered cosmetic and not generally covered with surgery, however she is frustrated because she can't wear shoes. I will discontinue her Lasix today and increase her valsartan and HCTZ up to 320/12.5 mg daily. Follow-up in one month for blood pressure check  Pixie Casino, MD, [facility] Attending Cardiologist [facility] 01/19/2016, 5:32 PM

## 2016-01-19 NOTE — Telephone Encounter (Signed)
See message below, please advise.

## 2016-01-19 NOTE — Telephone Encounter (Signed)
I called discussed. Get her appt next week for ROV so I can see her. thanks

## 2016-01-19 NOTE — Telephone Encounter (Signed)
Please have Dr. Lorin Mercy advise.

## 2016-01-22 NOTE — Telephone Encounter (Signed)
Talked with patient and advised her that Dr. Lorin Mercy wanted her to have a ROV this week.  Patient is scheduled for 01/23/16 @ 9:30am.

## 2016-01-23 ENCOUNTER — Ambulatory Visit (INDEPENDENT_AMBULATORY_CARE_PROVIDER_SITE_OTHER): Payer: Medicare HMO | Admitting: Orthopaedic Surgery

## 2016-01-23 ENCOUNTER — Encounter (INDEPENDENT_AMBULATORY_CARE_PROVIDER_SITE_OTHER): Payer: Self-pay | Admitting: Orthopaedic Surgery

## 2016-01-23 VITALS — BP 159/86 | HR 76 | Ht 66.0 in | Wt 197.0 lb

## 2016-01-23 DIAGNOSIS — M25512 Pain in left shoulder: Secondary | ICD-10-CM | POA: Diagnosis not present

## 2016-01-23 MED ORDER — LIDOCAINE HCL 1 % IJ SOLN
0.5000 mL | INTRAMUSCULAR | Status: AC | PRN
  Administered 2016-01-23: .5 mL

## 2016-01-23 MED ORDER — METHYLPREDNISOLONE ACETATE 40 MG/ML IJ SUSP
40.0000 mg | INTRAMUSCULAR | Status: AC | PRN
  Administered 2016-01-23: 40 mg via INTRA_ARTICULAR

## 2016-01-23 MED ORDER — BUPIVACAINE HCL 0.25 % IJ SOLN
4.0000 mL | INTRAMUSCULAR | Status: AC | PRN
  Administered 2016-01-23: 4 mL via INTRA_ARTICULAR

## 2016-01-23 NOTE — Progress Notes (Signed)
Office Visit Note   Patient: Andrea Lowery           Date of Birth: 20-Jan-1941           MRN: [id number] Visit Date: 01/23/2016              Requested by: Darden Amber, [address] [place], [place] [address] PCP: Darden Amber, PA   Assessment & Plan: Visit Diagnoses:  1. Acute pain of left shoulder   2. Left shoulder pain, unspecified chronicity            Impingement left shoulder.  Plan: Subacromial injection performed left shoulder. She can work on range of motion if she does not improve she'll let us know and we can proceed with the MRI diagnostic imaging a left shoulder to rule out rotator cuff tear.  Follow-Up Instructions: No Follow-up on file.   Orders:  Orders Placed This Encounter  Procedures  . Large Joint Injection/Arthrocentesis   No orders of the defined types were placed in this encounter.     Procedures: Large Joint Inj Date/Time: 01/23/2016 10:22 AM Performed by: Marybelle Killings Authorized by: Rodell Perna C   Location:  Shoulder Site:  L subacromial bursa Needle Size:  22 G Approach:  Lateral Ultrasound Guidance: No   Fluoroscopic Guidance: No   Arthrogram: No   Medications:  4 mL bupivacaine 0.25 %; 0.5 mL lidocaine 1 %; 40 mg methylPREDNISolone acetate 40 MG/ML Aspiration Attempted: No        Clinical Data: No additional findings.   Subjective: Chief Complaint  Patient presents with  . Left Shoulder - Pain    Patient presents today with left shoulder pain ongoing since 01/14/16. There is no known injury. She saw Dr. Erlinda Hong on 01/15/16 for her initial evaluation and was put on prednisone taper and given rx for norco. She states neither of these provided her relief. She states the pain is constant and sometimes radiates down to her hand. She has decreased ROM and difficulty with ADL, like combing hair.    Review of Systems 14 point review of systems updated and is unchanged from last month visit with Dr. Sherrian Divers other than  as above   Objective: Vital Signs: BP (!) 159/86   Pulse 76   Ht 5\' 6"  (1.676 m)   Wt 197 lb (89.4 kg)   BMI 31.80 kg/m   Physical Exam  Constitutional: She is oriented to person, place, and time. She appears well-developed.  HENT:  Head: Normocephalic.  Right Ear: External ear normal.  Left Ear: External ear normal.  Eyes: Pupils are equal, round, and reactive to light.  Neck: No tracheal deviation present. No thyromegaly present.  Cardiovascular: Normal rate.   Pulmonary/Chest: Effort normal.  Abdominal: Soft.  Musculoskeletal:  Healed lumbar incision. Positive impingement left shoulder. No brachial plexus tenderness negative Spurling. Some tenderness over the trapezial muscle on the left none on the right. Reflexes are 2+ and symmetrical. Positive impingement left shoulder negative right shoulder. Long head of the biceps is stable. Normal heel toe gait.  Neurological: She is alert and oriented to person, place, and time.  Skin: Skin is warm and dry.  Psychiatric: She has a normal mood and affect. Her behavior is normal.    Ortho Exam no subluxation left shoulder. Negative Hawkins sign positive Neer sign left shoulder only. Elbow reaches full extension. EPL EDC wrist extension flexion is normal.  Specialty Comments:  No specialty comments available.  Imaging:  No results found.   PMFS History: Patient Active Problem List   Diagnosis Date Noted  . Carotid artery disease ([place]) 01/19/2016  . Dyslipidemia 01/19/2016  . History of lumbar laminectomy for spinal cord decompression 07/31/2015  . Chronic pain of right hip 05/02/2015  . Temporal arteritis ([place]) 11/07/2014  . Headache 11/07/2014  . Diabetes mellitus type 2, controlled ([place]) 11/07/2014  . Diastolic CHF ([place]) [id number]  . Sacroiliac dysfunction 11/04/2014  . Trochanteric bursitis of right hip 11/04/2014  . Asthma 10/05/2014  . GERD (gastroesophageal reflux disease) 10/05/2014  . Allergic rhinitis 10/05/2014    . Multinodular goiter 08/09/2014  . Disorder of sacroiliac joint 07/29/2014  . 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 Hypersomnolence 04/16/2014  . Snoring 04/16/2014  . Preventive measure 01/25/2014  . Acute diastolic CHF (congestive heart failure), NYHA class 2 ([place]) 07/29/2013  . Bilateral leg edema 07/20/2013  . 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 Essential hypertension 07/01/2012  . LBBB (left bundle branch block) 07/01/2012  . DOE (dyspnea on exertion) 07/01/2012  . OSA on CPAP 07/01/2012   Past Medical History:  Diagnosis Date  . Arthritis   . Asthma   . Diabetes mellitus without complication ([place])    Diabetes II  . DVT (deep venous thrombosis) ([place])    "Years ago"  . Dyslipidemia   . GERD (gastroesophageal reflux disease)   . Headache    Migraine  . Hypertension   . Hypothyroidism   . LBBB (left bundle branch block)   . MGUS (monoclonal gammopathy of unknown significance)   . MGUS (monoclonal gammopathy of unknown significance)   . Migraine   . OSA on CPAP     Family History  Problem Relation Age of Onset  . Diabetes Father   . Heart failure Father   . Thyroid disease Sister     Past Surgical History:  Procedure Laterality Date  . ABDOMINAL HYSTERECTOMY  1969  . ARTERY BIOPSY N/A 11/07/2014   Procedure: BIOPSY TEMPORAL ARTERY;  Surgeon: Leta Baptist, MD;  Location: [facility];  Service: ENT;  Laterality: N/A;  . CARPAL TUNNEL RELEASE Right   . CHOLECYSTECTOMY  1976  . LUMBAR LAMINECTOMY/DECOMPRESSION MICRODISCECTOMY N/A 07/31/2015   Procedure: L4-5 Decompression, Possible Right L4-5 Microdiscectomy;  Surgeon: Marybelle Killings, MD;  Location: [facility];  Service: Orthopedics;  Laterality: N/A;  . NM MYOVIEW LTD  02/13/2010   No ischemia  . PLANTAR FASCIA RELEASE    . TONSILLECTOMY  1965  . US ECHOCARDIOGRAPHY  09/20/2008   borderline LVH,mild TR,AOV mildly sclerotic w/ca+ of the leaflets   Social History    Occupational History  . Not on file.   Social History Main Topics  . Smoking status: Former Smoker    Quit date: 02/04/2006  . Smokeless tobacco: Never Used  . Alcohol use No  . Drug use: No  . Sexual activity: No

## 2016-02-01 ENCOUNTER — Ambulatory Visit (INDEPENDENT_AMBULATORY_CARE_PROVIDER_SITE_OTHER): Payer: Medicare HMO | Admitting: *Deleted

## 2016-02-01 DIAGNOSIS — J309 Allergic rhinitis, unspecified: Secondary | ICD-10-CM

## 2016-02-12 ENCOUNTER — Ambulatory Visit (INDEPENDENT_AMBULATORY_CARE_PROVIDER_SITE_OTHER): Payer: Medicare HMO | Admitting: *Deleted

## 2016-02-12 DIAGNOSIS — T63441D Toxic effect of venom of bees, accidental (unintentional), subsequent encounter: Secondary | ICD-10-CM | POA: Diagnosis not present

## 2016-02-12 LAB — LIPID PANEL
Cholesterol: 190 mg/dL (ref <200)
HDL: 46 mg/dL — ABNORMAL LOW (ref >50)
LDL Cholesterol: 115 mg/dL — ABNORMAL HIGH (ref <100)
Total CHOL/HDL Ratio: 4.1 Ratio (ref <5.0)
Triglycerides: 144 mg/dL (ref <150)
VLDL: 29 mg/dL (ref <30)

## 2016-02-12 LAB — COMPREHENSIVE METABOLIC PANEL
ALT: 19 U/L (ref 6–29)
AST: 15 U/L (ref 10–35)
Albumin: 4 g/dL (ref 3.6–5.1)
Alkaline Phosphatase: 51 U/L (ref 33–130)
BUN: 10 mg/dL (ref 7–25)
CO2: 26 mmol/L (ref 20–31)
Calcium: 9.2 mg/dL (ref 8.6–10.4)
Chloride: 106 mmol/L (ref 98–110)
Creat: 0.77 mg/dL (ref 0.60–0.93)
Glucose, Bld: 138 mg/dL — ABNORMAL HIGH (ref 65–99)
Potassium: 3.4 mmol/L — ABNORMAL LOW (ref 3.5–5.3)
Sodium: 140 mmol/L (ref 135–146)
Total Bilirubin: 0.5 mg/dL (ref 0.2–1.2)
Total Protein: 7.3 g/dL (ref 6.1–8.1)

## 2016-02-14 ENCOUNTER — Ambulatory Visit (HOSPITAL_COMMUNITY)
Admission: RE | Admit: 2016-02-14 | Discharge: 2016-02-14 | Disposition: A | Discharge status: Home / Self Care | Payer: Medicare HMO | Source: Ambulatory Visit | Attending: Cardiovascular Disease | Admitting: Cardiovascular Disease

## 2016-02-14 DIAGNOSIS — E079 Disorder of thyroid, unspecified: Secondary | ICD-10-CM | POA: Diagnosis not present

## 2016-02-14 DIAGNOSIS — I779 Disorder of arteries and arterioles, unspecified: Secondary | ICD-10-CM

## 2016-02-14 DIAGNOSIS — I6523 Occlusion and stenosis of bilateral carotid arteries: Secondary | ICD-10-CM | POA: Insufficient documentation

## 2016-02-14 DIAGNOSIS — I739 Peripheral vascular disease, unspecified: Secondary | ICD-10-CM

## 2016-02-14 NOTE — Addendum Note (Signed)
Addended by: Felipa Emory on: 02/14/2016 04:45 PM   Modules accepted: Orders

## 2016-02-15 ENCOUNTER — Other Ambulatory Visit: Payer: Self-pay | Admitting: *Deleted

## 2016-02-15 DIAGNOSIS — I779 Disorder of arteries and arterioles, unspecified: Secondary | ICD-10-CM

## 2016-02-15 DIAGNOSIS — I739 Peripheral vascular disease, unspecified: Principal | ICD-10-CM

## 2016-02-21 ENCOUNTER — Ambulatory Visit (INDEPENDENT_AMBULATORY_CARE_PROVIDER_SITE_OTHER): Payer: Medicare HMO | Admitting: Orthopaedic Surgery

## 2016-02-26 ENCOUNTER — Other Ambulatory Visit: Payer: Self-pay

## 2016-02-26 ENCOUNTER — Ambulatory Visit (INDEPENDENT_AMBULATORY_CARE_PROVIDER_SITE_OTHER): Payer: Medicare HMO | Admitting: Internal Medicine

## 2016-02-26 VITALS — BP 120/80 | HR 76 | Ht 66.0 in | Wt 193.0 lb

## 2016-02-26 DIAGNOSIS — I447 Left bundle-branch block, unspecified: Secondary | ICD-10-CM

## 2016-02-26 DIAGNOSIS — I10 Essential (primary) hypertension: Secondary | ICD-10-CM

## 2016-02-26 DIAGNOSIS — E785 Hyperlipidemia, unspecified: Secondary | ICD-10-CM | POA: Diagnosis not present

## 2016-02-26 MED ORDER — ROSUVASTATIN CALCIUM 40 MG PO TABS: 40.0000 mg | ORAL_TABLET | Freq: Every day | ORAL | 3 refills | Status: DC

## 2016-02-26 MED ORDER — EZETIMIBE 10 MG PO TABS: 10.0000 mg | ORAL_TABLET | Freq: Every day | ORAL | 3 refills | Status: DC

## 2016-02-26 NOTE — Progress Notes (Signed)
OFFICE NOTE  Chief Complaint:  Routine follow-up  Primary Care Physician: Darden Amber, PA  HPI:  Andrea Lowery is a 76 year old female, history of some lower extremity edema, asthma, seasonal allergies, and a left bundle branch block. There was no evidence for ischemia based on stress testing in 2012. The main issue today is she was told recently that she was having worsening problems with snoring and witnessed episodes of apnea at night. She says her sleep is poor. She often feels fatigued throughout the day. Occasionally wakes up with headaches in the morning. Apparently, she underwent a sleep study in 2008 or so and that was negative; however, her weight was about 150 to 160 at the time. Since she has stopped smoking and gained about 40 pounds. I was concerned about sleep apnea and sent her for a sleep study which demonstrated significant sleep apnea. She was fitted with a mask and is doing well.  I have also readjusted her medications, switching her to Diovan HCTZ and decreasing her amlodipine, which has since been discontinued totally. She also recently underwent extensive dental work by Dr. Buelah Manis.  This included moderate sedation and was without complication.  She returns today feeling fairly well. EKG continues to show persistent left bundle branch block.  Andrea Lowery returns today for followup appointment. She was just seen by Cecilie Kicks, nurse practitioner, in our office for hospital followup. She presented to the hospital with shortness of breath and was told she was in congestive heart failure. A chest x-ray showed borderline cardiomegaly with early interstitial edema. A BNP was only mildly elevated at 170. She was told she had significant heart failure and there was concern about her persistent left bundle branch block which is well-documented and is nonischemic. She was discharged and in followup was given additional Lasix and lost 2 pounds. She reports her  breathing is back to normal although she still has persistent leg swelling. She is not good at reducing salt in her diet in fact eats a good amount of it. In addition, she is not on beta blocker.  A repeat echocardiogram was performed today, and demonstrated preserved systolic function with an EF of 55-60%, no significant valvular disease, and diastolic dysfunction.  I saw Andrea Lowery back today in the office. Her main complaint still is about ankle swelling. She has nonpitting edema mostly over the lateral malleolus bilaterally. Seems to be slightly worse in the right than the left. She denies any worsening shortness of breath or chest pain. She tells me that her primary care provider decrease her antihypertensive medications for unknown reasons, specifically valsartan was cut in half. She continues to have mild hypokalemia on low-dose repletion. She also was switched to pravastatin but is interested in going back to Crestor.  Andrea Lowery returns today for hospital follow-up. He was recently hospitalized and had mild diastolic heart failure exacerbation. In addition she was having headaches and thought to have possible temporal arteritis, however a temporal biopsy was negative. Subsequently she has been given additional medication for headaches. From a cardiology standpoint, she appears to be quite euvolemic on her current dose of diuretics, which had increased her to most recently. She reports some swelling around the right and left lateral malleoli, which does not appear to be edema.  01/19/2016  Andrea Lowery returns today for follow-up. Since I last saw her she reports no worsening shortness of breath or chest pain. She does have bilateral carotid artery disease which would require follow-up. Blood pressure  is noted to be elevated today 167/94. For some reason her valsartan and HCTZ was decreased from the 320/12.5 mg dose to the 160/12.5 mg dose. She is also on Lasix. She denies any significant  lower extremity edema.  02/26/2016  Andrea Lowery was seen today in follow-up. Her blood pressure is now much better controlled 120/80. This is likely due to better compliance with medications. She does report however that compliance with Crestor has been minimal, only taking 3-4 doses per week. Recent lab work indicated total cholesterol of 190, temperature is 144, HDL-C 46 and LDL-C 115. We discussed that her goal LDL should be much lower less than 70. Even if she were to take that medicine on a daily basis is not likely she will reach her goal. I felt like she may benefit from the addition of ezetimibe to her current regimen.  PMHx:  Past Medical History:  Diagnosis Date  . Arthritis   . Asthma   . Diabetes mellitus without complication ([place])    Diabetes II  . DVT (deep venous thrombosis) ([place])    "Years ago"  . Dyslipidemia   . GERD (gastroesophageal reflux disease)   . Headache    Migraine  . Hypertension   . Hypothyroidism   . LBBB (left bundle branch block)   . MGUS (monoclonal gammopathy of unknown significance)   . MGUS (monoclonal gammopathy of unknown significance)   . Migraine   . OSA on CPAP     Past Surgical History:  Procedure Laterality Date  . ABDOMINAL HYSTERECTOMY  1969  . ARTERY BIOPSY N/A 11/07/2014   Procedure: BIOPSY TEMPORAL ARTERY;  Surgeon: Leta Baptist, MD;  Location: [facility];  Service: ENT;  Laterality: N/A;  . CARPAL TUNNEL RELEASE Right   . CHOLECYSTECTOMY  1976  . LUMBAR LAMINECTOMY/DECOMPRESSION MICRODISCECTOMY N/A 07/31/2015   Procedure: L4-5 Decompression, Possible Right L4-5 Microdiscectomy;  Surgeon: Marybelle Killings, MD;  Location: [facility];  Service: Orthopedics;  Laterality: N/A;  . NM MYOVIEW LTD  02/13/2010   No ischemia  . PLANTAR FASCIA RELEASE    . TONSILLECTOMY  1965  . US ECHOCARDIOGRAPHY  09/20/2008   borderline LVH,mild TR,AOV mildly sclerotic w/ca+ of the leaflets    FAMHx:  Family History  Problem Relation Age of Onset  . Diabetes Father    . Heart failure Father   . Thyroid disease Sister     SOCHx:   reports that she quit smoking about 10 years ago. She has never used smokeless tobacco. She reports that she does not drink alcohol or use drugs.  ALLERGIES:  Allergies  Allergen Reactions  . Bee Venom Anaphylaxis    Yellow jackets  . Penicillins Hives    Has patient had a PCN reaction causing immediate rash, facial/tongue/throat swelling, SOB or lightheadedness with hypotension: Yes Has patient had a PCN reaction causing severe rash involving mucus membranes or skin necrosis: No Has patient had a PCN reaction that required hospitalization No Has patient had a PCN reaction occurring within the last 10 years: No If all of the above answers are "NO", then may proceed with Cephalosporin use.   Marland Kitchen Oxycodone Other (See Comments)    Hallucinations   ROS: Pertinent items noted in HPI and remainder of comprehensive ROS otherwise negative.  HOME MEDS: Current Outpatient Prescriptions  Medication Sig Dispense Refill  . albuterol (PROVENTIL HFA;VENTOLIN HFA) 108 (90 BASE) MCG/ACT inhaler Inhale 2 puffs into the lungs every 4 (four) hours as needed for wheezing or shortness of  breath.     Marland Kitchen alendronate (FOSAMAX) 70 MG tablet Take 70 mg by mouth every Wednesday.     . beclomethasone (QVAR) 80 MCG/ACT inhaler Inhale 2 puffs into the lungs 2 (two) times daily.    . benzonatate (TESSALON) 100 MG capsule Take 1 capsule (100 mg total) by mouth every 8 (eight) hours. 21 capsule 0  . Blood Glucose Monitoring Suppl (ACCU-CHEK AVIVA PLUS) w/Device KIT by Does not apply route.    . Calcium Carb-Cholecalciferol (CALCIUM 600 + D PO) Take 2 tablets by mouth daily.    . cetirizine (ZYRTEC) 10 MG tablet Take 10 mg by mouth daily.     . clindamycin (CLEOCIN) 150 MG capsule Take 1 capsule (150 mg total) by mouth every 6 (six) hours. 28 capsule 0  . clindamycin (CLEOCIN) 150 MG capsule Take 1 capsule (150 mg total) by mouth every 6 (six) hours. 28  capsule 0  . cycloSPORINE (RESTASIS) 0.05 % ophthalmic emulsion Place 1 drop into both eyes at bedtime.     . diclofenac sodium (VOLTAREN) 1 % GEL Apply 2 g topically daily as needed (Knee pain).     Marland Kitchen EPINEPHrine 0.3 mg/0.3 mL IJ SOAJ injection Use as directed for severe allergic reaction 2 Device 1  . esomeprazole (NEXIUM) 40 MG capsule Take 40 mg by mouth 2 (two) times daily.     . fluticasone (FLONASE) 50 MCG/ACT nasal spray Place 2 sprays into both nostrils as needed for allergies.     Marland Kitchen HYDROcodone-acetaminophen (NORCO) 5-325 MG tablet Take 1 tablet by mouth 2 (two) times daily as needed. 14 tablet 0  . levothyroxine (SYNTHROID, LEVOTHROID) 50 MCG tablet take 1 tablet by mouth once daily (Patient taking differently: take 1 tablet (50 mcg) by mouth once daily) 90 tablet 0  . metoprolol succinate (TOPROL-XL) 25 MG 24 hr tablet Take 25 mg by mouth daily.    . mometasone-formoterol (DULERA) 100-5 MCG/ACT AERO Inhale 2 puffs into the lungs as needed for wheezing or shortness of breath.     . montelukast (SINGULAIR) 10 MG tablet Take 10 mg by mouth at bedtime.    . Olopatadine HCl (PATADAY) 0.2 % SOLN Apply 1 drop to eye daily as needed (Allergies).     . potassium chloride SA (K-DUR,KLOR-CON) 20 MEQ tablet Take 20 mEq by mouth 2 (two) times daily.     . predniSONE (STERAPRED UNI-PAK 21 TAB) 10 MG (21) TBPK tablet Take 1 tablet (10 mg total) by mouth daily. Take as directed 21 tablet 0  . tobramycin (TOBREX) 0.3 % ophthalmic solution Place 2 drops into both eyes 3 (three) times daily. 5 mL 0  . tobramycin-dexamethasone (TOBRADEX) ophthalmic solution Place 2 drops into both eyes every 4 (four) hours while awake. 5 mL 0  . topiramate (TOPAMAX) 50 MG tablet Take 50 mg by mouth daily as needed (for migraines).     . traZODone (DESYREL) 50 MG tablet Take 50 mg by mouth at bedtime as needed for sleep.     . valsartan-hydrochlorothiazide (DIOVAN-HCT) 320-12.5 MG tablet Take 1 tablet by mouth daily. 30  tablet 5  . ezetimibe (ZETIA) 10 MG tablet Take 1 tablet (10 mg total) by mouth daily. 90 tablet 3  . rosuvastatin (CRESTOR) 40 MG tablet Take 1 tablet (40 mg total) by mouth daily. Need appointment before anymore refills 90 tablet 3   No current facility-administered medications for this visit.     LABS/IMAGING: No results found for this or any previous visit (from  the past 48 hour(s)). No results found.  VITALS: BP 120/80   Pulse 76   Ht 5' 6"  (1.676 m)   Wt 193 lb (87.5 kg)   SpO2 99%   BMI 31.15 kg/m   EXAM: Deferred  EKG: Deferred  ASSESSMENT: 1. Left bundle branch block 2. Dyslipidemia 3. Hypertension 4. Mild dyspnea on exertion 5. Obstructive sleep apnea on CPAP 6. Diastolic dysfunction with recent mild acute congestive heart failure, EF 55-60% 7. Moderate bilateral carotid artery disease 8. Lipidemia at the ankles  PLAN: 1.   Andrea Lowery now has better control of her blood pressure after restarting her medications. She is committed to more compliance with medical therapy. I've advised her to take her Crestor on a daily basis and will add ezetimibe 10 mg daily to her current regimen. Plan to recheck her cholesterol in 2 months. Follow-up with me in one year.  Pixie Casino, MD, [facility] Attending Cardiologist [place] Hilty 02/26/2016, 4:16 PM

## 2016-02-26 NOTE — Patient Instructions (Addendum)
Your physician has recommended you make the following change in your medication: START zetia 10mg  once daily  Your physician wants you to follow-up in: ONE YEAR with Dr. Debara Pickett. You will receive a reminder letter in the mail two months in advance. If you don't receive a letter, please call our office to schedule the follow-up appointment.  Your physician recommends that you return for lab work in: TWO MONTHS (fasting)

## 2016-02-28 ENCOUNTER — Ambulatory Visit (INDEPENDENT_AMBULATORY_CARE_PROVIDER_SITE_OTHER): Payer: Medicare HMO

## 2016-02-28 DIAGNOSIS — J309 Allergic rhinitis, unspecified: Secondary | ICD-10-CM | POA: Diagnosis not present

## 2016-03-15 ENCOUNTER — Ambulatory Visit (INDEPENDENT_AMBULATORY_CARE_PROVIDER_SITE_OTHER): Payer: Medicare HMO | Admitting: Family

## 2016-03-15 DIAGNOSIS — M7542 Impingement syndrome of left shoulder: Secondary | ICD-10-CM

## 2016-03-15 DIAGNOSIS — M7061 Trochanteric bursitis, right hip: Secondary | ICD-10-CM | POA: Diagnosis not present

## 2016-03-15 DIAGNOSIS — M533 Sacrococcygeal disorders, not elsewhere classified: Secondary | ICD-10-CM

## 2016-03-15 MED ORDER — MELOXICAM 7.5 MG PO TABS: 7.5000 mg | ORAL_TABLET | Freq: Every day | ORAL | 0 refills | Status: DC

## 2016-03-15 NOTE — Progress Notes (Signed)
Office Visit Note   Patient: Andrea Lowery           Date of Birth: 1940-07-28           MRN: [id number] Visit Date: 03/15/2016              Requested by: Darden Amber, [place], [place] [address] PCP: Darden Amber, PA  No chief complaint on file.   HPI: The patient is a 76 year old woman who is seen today for 2 separate issues. She is complaining of right hip pain. She is status post laminectomy of L4-L5. States she thought this would really improve her hip and low back pain. Denies low back pain today. Does point to the buttocks on the right and the lateral hip as most painful areas. Does complain of shooting pain and numbness down the lateral right lower extremity.  Also seen for evaluation of left shoulder pain. Has trialed a prednisone taper as well as a Depo-Medrol injection. The prednisone taper was not helpful. About a month ago received a Depo-Medrol injection with Dr. Lorin Mercy. States this worked well. however has had slow return of her symptoms. These have been much worse this week. complains of dull aching pain in the biceps as well as some numbness down her forearm into the thumb.    Assessment & Plan: Visit Diagnoses:  1. Trochanteric bursitis of right hip   2. Sacroiliac dysfunction   3. Impingement syndrome of left shoulder     Plan: Him offered cortisone injection for bursitis. Patient declined at this time. Have provided a prescription for meloxicam which she will begin taking. Also we'll proceed with MRI of the left shoulder. She'll follow up in office following MRI to review with Dr. Lorin Mercy.  Follow-Up Instructions: Return for p mri with yates.   Physical Exam  Constitutional: Appears well-developed.  Head: Normocephalic.  Eyes: EOM are normal.  Neck: Normal range of motion.  Cardiovascular: Normal rate.   Pulmonary/Chest: Effort normal.  Neurological: Is alert.  Skin: Skin is warm.  Psychiatric: Has a normal mood and  affect. SI joint tenderness on the right. As well as tenderness over the greater trochanter. No spinous process tenderness in the lumbar spine. No weakness. Left Shoulder Exam   Tenderness  The patient is experiencing tenderness in the biceps tendon.  Range of Motion  Active Abduction: abnormal Left shoulder active abduction: unable to reach over head due to pain.  Passive Abduction: normal   Tests  Impingement: positive  Other  Pulse: present        Imaging: No results found.  Orders:  No orders of the defined types were placed in this encounter.  Meds ordered this encounter  Medications  . meloxicam (MOBIC) 7.5 MG tablet    Sig: Take 1 tablet (7.5 mg total) by mouth daily.    Dispense:  30 tablet    Refill:  0     Procedures: No procedures performed  Clinical Data: No additional findings.  Subjective: Review of Systems  Constitutional: Negative for chills and fever.  Musculoskeletal: Positive for arthralgias and back pain. Negative for gait problem, joint swelling and neck pain.  Neurological: Positive for numbness. Negative for weakness.    Objective: Vital Signs: There were no vitals taken for this visit.  Specialty Comments:  No specialty comments available.  PMFS History: Patient Active Problem List   Diagnosis Date Noted  . Carotid artery disease ([place]) 01/19/2016  . Dyslipidemia 01/19/2016  .  History of lumbar laminectomy for spinal cord decompression 07/31/2015  . Chronic pain of right hip 05/02/2015  . Temporal arteritis ([place]) 11/07/2014  . Headache 11/07/2014  . Diabetes mellitus type 2, controlled ([place]) 11/07/2014  . Diastolic CHF ([place]) Q000111Q  . Sacroiliac dysfunction 11/04/2014  . Trochanteric bursitis of right hip 11/04/2014  . Asthma 10/05/2014  . GERD (gastroesophageal reflux disease) 10/05/2014  . Allergic rhinitis 10/05/2014  . Multinodular goiter 08/09/2014  . Disorder of sacroiliac joint 07/29/2014  . Spondylosis of  lumbar region without myelopathy or radiculopathy 07/29/2014  . Hypersomnolence 04/16/2014  . Snoring 04/16/2014  . Preventive measure 01/25/2014  . Acute diastolic CHF (congestive heart failure), NYHA class 2 ([place]) 07/29/2013  . Bilateral leg edema 07/20/2013  . 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 Essential hypertension 07/01/2012  . LBBB (left bundle branch block) 07/01/2012  . DOE (dyspnea on exertion) 07/01/2012  . OSA on CPAP 07/01/2012   Past Medical History:  Diagnosis Date  . Arthritis   . Asthma   . Diabetes mellitus without complication ([place])    Diabetes II  . DVT (deep venous thrombosis) ([place])    "Years ago"  . Dyslipidemia   . GERD (gastroesophageal reflux disease)   . Headache    Migraine  . Hypertension   . Hypothyroidism   . LBBB (left bundle branch block)   . MGUS (monoclonal gammopathy of unknown significance)   . MGUS (monoclonal gammopathy of unknown significance)   . Migraine   . OSA on CPAP     Family History  Problem Relation Age of Onset  . Diabetes Father   . Heart failure Father   . Thyroid disease Sister     Past Surgical History:  Procedure Laterality Date  . ABDOMINAL HYSTERECTOMY  1969  . ARTERY BIOPSY N/A 11/07/2014   Procedure: BIOPSY TEMPORAL ARTERY;  Surgeon: Leta Baptist, MD;  Location: [facility];  Service: ENT;  Laterality: N/A;  . CARPAL TUNNEL RELEASE Right   . CHOLECYSTECTOMY  1976  . LUMBAR LAMINECTOMY/DECOMPRESSION MICRODISCECTOMY N/A 07/31/2015   Procedure: L4-5 Decompression, Possible Right L4-5 Microdiscectomy;  Surgeon: Marybelle Killings, MD;  Location: [facility];  Service: Orthopedics;  Laterality: N/A;  . NM MYOVIEW LTD  02/13/2010   No ischemia  . PLANTAR FASCIA RELEASE    . TONSILLECTOMY  1965  . US ECHOCARDIOGRAPHY  09/20/2008   borderline LVH,mild TR,AOV mildly sclerotic w/ca+ of the leaflets   Social History   Occupational History  . Not on file.   Social History Main Topics  . Smoking status:  Former Smoker    Quit date: 02/04/2006  . Smokeless tobacco: Never Used  . Alcohol use No  . Drug use: No  . Sexual activity: No

## 2016-03-27 ENCOUNTER — Ambulatory Visit (HOSPITAL_COMMUNITY)
Admission: RE | Admit: 2016-03-27 | Discharge: 2016-03-27 | Disposition: A | Discharge status: Home / Self Care | Payer: Medicare HMO | Source: Ambulatory Visit | Attending: Family | Admitting: Family

## 2016-03-27 ENCOUNTER — Ambulatory Visit (INDEPENDENT_AMBULATORY_CARE_PROVIDER_SITE_OTHER): Payer: Medicare HMO | Admitting: *Deleted

## 2016-03-27 DIAGNOSIS — M19012 Primary osteoarthritis, left shoulder: Secondary | ICD-10-CM | POA: Diagnosis not present

## 2016-03-27 DIAGNOSIS — J309 Allergic rhinitis, unspecified: Secondary | ICD-10-CM

## 2016-03-27 DIAGNOSIS — M7542 Impingement syndrome of left shoulder: Secondary | ICD-10-CM | POA: Diagnosis present

## 2016-03-27 DIAGNOSIS — M75122 Complete rotator cuff tear or rupture of left shoulder, not specified as traumatic: Secondary | ICD-10-CM | POA: Diagnosis not present

## 2016-03-27 NOTE — Progress Notes (Signed)
Will you call and schedule f/u with duda for mri review

## 2016-03-28 NOTE — Progress Notes (Signed)
Called patient scheduled appointment 04-01-16 @ 12:30pm

## 2016-04-01 ENCOUNTER — Encounter (INDEPENDENT_AMBULATORY_CARE_PROVIDER_SITE_OTHER): Payer: Self-pay | Admitting: Orthopedic Surgery

## 2016-04-01 ENCOUNTER — Ambulatory Visit (INDEPENDENT_AMBULATORY_CARE_PROVIDER_SITE_OTHER): Payer: Medicare HMO | Admitting: Orthopedic Surgery

## 2016-04-01 VITALS — Ht 66.0 in | Wt 193.0 lb

## 2016-04-01 DIAGNOSIS — M7542 Impingement syndrome of left shoulder: Secondary | ICD-10-CM | POA: Diagnosis not present

## 2016-04-01 NOTE — Progress Notes (Signed)
Office Visit Note   Patient: Andrea Lowery           Date of Birth: Nov 10, 1940           MRN: [id number] Visit Date: 04/01/2016              Requested by: Darden Amber, [place], [place] [address] PCP: Darden Amber, PA  Chief Complaint  Patient presents with  . Left Shoulder - Follow-up    HPI: MRI left shoulder review. Autumn L Forrest, RMA  Patient complains of persistent left shoulder pain with activities of daily living.  Assessment & Plan: Visit Diagnoses:  1. Impingement syndrome of left shoulder     Plan: Due to persistent pain with activities of daily living MRI scan showing partial thickness rotator cuff tear with impingement patient states she would like to proceed with shoulder arthroscopy this time. Plan for subacromial decompression debridement of the rotator cuff tear. Risk and benefits were discussed including persistent pain infection need for additional surgery. Patient states she understands wish to proceed at this time.  Follow-Up Instructions: No Follow-up on file.   Ortho Exam Examination patient is alert oriented no adenopathy well-dressed normal affect normal respiratory effort she has a normal gait. Examination she has decreased range of motion left shoulder she has pain with Neer and Hawkins impingement test pain with drop arm test. MRI scan is reviewed which shows partial tearing of the rotator cuff without retraction.  Imaging: No results found.  Orders:  No orders of the defined types were placed in this encounter.  No orders of the defined types were placed in this encounter.    Procedures: No procedures performed  Clinical Data: No additional findings.  Subjective: Review of Systems  Objective: Vital Signs: Ht 5\' 6"  (1.676 m)   Wt 193 lb (87.5 kg)   BMI 31.15 kg/m   Specialty Comments:  No specialty comments available.  PMFS History: Patient Active Problem List   Diagnosis Date Noted  .  Impingement syndrome of left shoulder 04/05/2016  . Carotid artery disease ([place]) 01/19/2016  . Dyslipidemia 01/19/2016  . History of lumbar laminectomy for spinal cord decompression 07/31/2015  . Chronic pain of right hip 05/02/2015  . Temporal arteritis ([place]) 11/07/2014  . Headache 11/07/2014  . Diabetes mellitus type 2, controlled ([place]) 11/07/2014  . Diastolic CHF ([place]) Q000111Q  . Sacroiliac dysfunction 11/04/2014  . Trochanteric bursitis of right hip 11/04/2014  . Asthma 10/05/2014  . GERD (gastroesophageal reflux disease) 10/05/2014  . Allergic rhinitis 10/05/2014  . Multinodular goiter 08/09/2014  . Disorder of sacroiliac joint 07/29/2014  . 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 Hypersomnolence 04/16/2014  . Snoring 04/16/2014  . Preventive measure 01/25/2014  . Acute diastolic CHF (congestive heart failure), NYHA class 2 ([place]) 07/29/2013  . Bilateral leg edema 07/20/2013  . 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 Essential hypertension 07/01/2012  . LBBB (left bundle branch block) 07/01/2012  . DOE (dyspnea on exertion) 07/01/2012  . OSA on CPAP 07/01/2012   Past Medical History:  Diagnosis Date  . Arthritis   . Asthma   . Diabetes mellitus without complication ([place])    Diabetes II  . DVT (deep venous thrombosis) ([place])    "Years ago"  . Dyslipidemia   . GERD (gastroesophageal reflux disease)   . Headache    Migraine  . Hypertension   . Hypothyroidism   . LBBB (left bundle  branch block)   . MGUS (monoclonal gammopathy of unknown significance)   . MGUS (monoclonal gammopathy of unknown significance)   . Migraine   . OSA on CPAP     Family History  Problem Relation Age of Onset  . Diabetes Father   . Heart failure Father   . Thyroid disease Sister     Past Surgical History:  Procedure Laterality Date  . ABDOMINAL HYSTERECTOMY  1969  . ARTERY BIOPSY N/A 11/07/2014   Procedure: BIOPSY  TEMPORAL ARTERY;  Surgeon: Leta Baptist, MD;  Location: [facility];  Service: ENT;  Laterality: N/A;  . CARPAL TUNNEL RELEASE Right   . CHOLECYSTECTOMY  1976  . LUMBAR LAMINECTOMY/DECOMPRESSION MICRODISCECTOMY N/A 07/31/2015   Procedure: L4-5 Decompression, Possible Right L4-5 Microdiscectomy;  Surgeon: Marybelle Killings, MD;  Location: [facility];  Service: Orthopedics;  Laterality: N/A;  . NM MYOVIEW LTD  02/13/2010   No ischemia  . PLANTAR FASCIA RELEASE    . TONSILLECTOMY  1965  . US ECHOCARDIOGRAPHY  09/20/2008   borderline LVH,mild TR,AOV mildly sclerotic w/ca+ of the leaflets   Social History   Occupational History  . Not on file.   Social History Main Topics  . Smoking status: Former Smoker    Quit date: 02/04/2006  . Smokeless tobacco: Never Used  . Alcohol use No  . Drug use: No  . Sexual activity: No

## 2016-04-02 ENCOUNTER — Ambulatory Visit (INDEPENDENT_AMBULATORY_CARE_PROVIDER_SITE_OTHER): Payer: Medicare HMO | Admitting: Allergy and Immunology

## 2016-04-02 ENCOUNTER — Telehealth: Payer: Self-pay | Admitting: Internal Medicine

## 2016-04-02 ENCOUNTER — Encounter: Payer: Self-pay | Admitting: Allergy and Immunology

## 2016-04-02 VITALS — BP 128/68 | HR 72 | Resp 18

## 2016-04-02 DIAGNOSIS — T63441D Toxic effect of venom of bees, accidental (unintentional), subsequent encounter: Secondary | ICD-10-CM

## 2016-04-02 DIAGNOSIS — J4540 Moderate persistent asthma, uncomplicated: Secondary | ICD-10-CM | POA: Diagnosis not present

## 2016-04-02 DIAGNOSIS — J3089 Other allergic rhinitis: Secondary | ICD-10-CM | POA: Diagnosis not present

## 2016-04-02 DIAGNOSIS — K219 Gastro-esophageal reflux disease without esophagitis: Secondary | ICD-10-CM | POA: Diagnosis not present

## 2016-04-02 NOTE — Telephone Encounter (Signed)
Reviewed, based on order entry and release, should be viewable in [facility] system.

## 2016-04-02 NOTE — Progress Notes (Signed)
Follow-up Note  Referring Provider: Darden Amber, PA Primary Provider: Darden Amber, PA Date of Office Visit: 04/02/2016  Subjective:   Andrea Lowery (DOB: 07-27-1940) is a 76 y.o. female who returns to the [facility] on 04/02/2016 in re-evaluation of the following:  HPI: Andrea Lowery returns to this clinic in reevaluation of her asthma, allergic rhinitis, history of chronic sinusitis, and reflux-induced respiratory disease and Hymenoptera allergy. She has not been seen in this clinic since May 2017. At that point in time we asked her to obtain a IgE level in anticipation of possibly starting omalizumab but unfortunately her level was too low to qualify for that medication.  She's had 2 exacerbations of her asthma since May both responding to a steroid and an antibiotic occurring in December 2017 and January 2018 Otherwise, she has done relatively well with her asthma and rarely uses a short acting bronchodilator. She continues on a combination of Dulera and Qvar. She does occasionally have a slight cough but no wheezing and no shortness of breath or chest tightness or dyspnea on exertion.  Her nose has been doing relatively well but occasionally she does get stuffiness in her nose and she feels as though her nose is somewhat dry. She continues to use Flonase and montelukast on a consistent basis.  Her reflux is under very good control this point in time and she has no problems with her throat.  She's been using immunotherapy for venoms and aeroallergens and has not had an adverse effects secondary to the use of this therapy. She does have an EpiPen.  She did obtain the flu vaccine this year.  Allergies as of 04/02/2016      Reactions   Bee Venom Anaphylaxis   Yellow jackets   Penicillins Hives   Has patient had a PCN reaction causing immediate rash, facial/tongue/throat swelling, SOB or lightheadedness with hypotension: Yes Has patient had a PCN reaction  causing severe rash involving mucus membranes or skin necrosis: No Has patient had a PCN reaction that required hospitalization No Has patient had a PCN reaction occurring within the last 10 years: No If all of the above answers are "NO", then may proceed with Cephalosporin use.   Oxycodone Other (See Comments)   Hallucinations      Medication List      ACCU-CHEK AVIVA PLUS w/Device Kit by Does not apply route.   albuterol 108 (90 Base) MCG/ACT inhaler Commonly known as:  PROVENTIL HFA;VENTOLIN HFA Inhale 2 puffs into the lungs every 4 (four) hours as needed for wheezing or shortness of breath.   beclomethasone 80 MCG/ACT inhaler Commonly known as:  QVAR Inhale 2 puffs into the lungs 2 (two) times daily.   benzonatate 100 MG capsule Commonly known as:  TESSALON Take 1 capsule (100 mg total) by mouth every 8 (eight) hours.   CALCIUM 600 + D PO Take 2 tablets by mouth daily.   cetirizine 10 MG tablet Commonly known as:  ZYRTEC Take 10 mg by mouth daily.   clindamycin 150 MG capsule Commonly known as:  CLEOCIN Take 1 capsule (150 mg total) by mouth every 6 (six) hours.   cycloSPORINE 0.05 % ophthalmic emulsion Commonly known as:  RESTASIS Place 1 drop into both eyes at bedtime.   EPINEPHrine 0.3 mg/0.3 mL Soaj injection Commonly known as:  EPI-PEN Use as directed for severe allergic reaction   esomeprazole 40 MG capsule Commonly known as:  NEXIUM Take 40 mg by mouth 2 (  two) times daily.   ezetimibe 10 MG tablet Commonly known as:  ZETIA Take 1 tablet (10 mg total) by mouth daily.   fluticasone 50 MCG/ACT nasal spray Commonly known as:  FLONASE Place 2 sprays into both nostrils as needed for allergies.   FOSAMAX 70 MG tablet Generic drug:  alendronate Take 70 mg by mouth every Wednesday.   HYDROcodone-acetaminophen 5-325 MG tablet Commonly known as:  NORCO Take 1 tablet by mouth 2 (two) times daily as needed.   levothyroxine 50 MCG tablet Commonly known  as:  SYNTHROID, LEVOTHROID take 1 tablet by mouth once daily   meloxicam 7.5 MG tablet Commonly known as:  MOBIC Take 1 tablet (7.5 mg total) by mouth daily.   metoprolol succinate 25 MG 24 hr tablet Commonly known as:  TOPROL-XL Take 25 mg by mouth daily.   mometasone-formoterol 100-5 MCG/ACT Aero Commonly known as:  DULERA Inhale 2 puffs into the lungs as needed for wheezing or shortness of breath.   montelukast 10 MG tablet Commonly known as:  SINGULAIR Take 10 mg by mouth at bedtime.   PATADAY 0.2 % Soln Generic drug:  Olopatadine HCl Apply 1 drop to eye daily as needed (Allergies).   potassium chloride SA 20 MEQ tablet Commonly known as:  K-DUR,KLOR-CON Take 20 mEq by mouth 2 (two) times daily.   rosuvastatin 40 MG tablet Commonly known as:  CRESTOR Take 1 tablet (40 mg total) by mouth daily. Need appointment before anymore refills   topiramate 50 MG tablet Commonly known as:  TOPAMAX Take 50 mg by mouth daily as needed (for migraines).   traZODone 50 MG tablet Commonly known as:  DESYREL Take 50 mg by mouth at bedtime as needed for sleep.   valsartan-hydrochlorothiazide 320-12.5 MG tablet Commonly known as:  DIOVAN-HCT Take 1 tablet by mouth daily.   VOLTAREN 1 % Gel Generic drug:  diclofenac sodium Apply 2 g topically daily as needed (Knee pain).       Past Medical History:  Diagnosis Date  . Arthritis   . Asthma   . Diabetes mellitus without complication ([place])    Diabetes II  . DVT (deep venous thrombosis) ([place])    "Years ago"  . Dyslipidemia   . GERD (gastroesophageal reflux disease)   . Headache    Migraine  . Hypertension   . Hypothyroidism   . LBBB (left bundle branch block)   . MGUS (monoclonal gammopathy of unknown significance)   . MGUS (monoclonal gammopathy of unknown significance)   . Migraine   . OSA on CPAP     Past Surgical History:  Procedure Laterality Date  . ABDOMINAL HYSTERECTOMY  1969  . ARTERY BIOPSY N/A 11/07/2014     Procedure: BIOPSY TEMPORAL ARTERY;  Surgeon: Leta Baptist, MD;  Location: [facility];  Service: ENT;  Laterality: N/A;  . CARPAL TUNNEL RELEASE Right   . CHOLECYSTECTOMY  1976  . LUMBAR LAMINECTOMY/DECOMPRESSION MICRODISCECTOMY N/A 07/31/2015   Procedure: L4-5 Decompression, Possible Right L4-5 Microdiscectomy;  Surgeon: Marybelle Killings, MD;  Location: [facility];  Service: Orthopedics;  Laterality: N/A;  . NM MYOVIEW LTD  02/13/2010   No ischemia  . PLANTAR FASCIA RELEASE    . TONSILLECTOMY  1965  . US ECHOCARDIOGRAPHY  09/20/2008   borderline LVH,mild TR,AOV mildly sclerotic w/ca+ of the leaflets    Review of systems negative except as noted in HPI / PMHx or noted below:  Review of Systems  Constitutional: Negative.   HENT: Negative.   Eyes:  Negative.   Respiratory: Negative.   Cardiovascular: Negative.   Gastrointestinal: Negative.   Genitourinary: Negative.   Musculoskeletal: Negative.   Skin: Negative.   Neurological: Negative.   Endo/Heme/Allergies: Negative.   Psychiatric/Behavioral: Negative.      Objective:   Vitals:   04/02/16 1456  BP: 128/68  Pulse: 72  Resp: 18          Physical Exam  Constitutional: She is well-developed, well-nourished, and in no distress.  HENT:  Head: Normocephalic.  Right Ear: Tympanic membrane, external ear and ear canal normal.  Left Ear: Tympanic membrane, external ear and ear canal normal.  Nose: Nose normal. No mucosal edema or rhinorrhea.  Mouth/Throat: Uvula is midline, oropharynx is clear and moist and mucous membranes are normal. No oropharyngeal exudate.  Eyes: Conjunctivae are normal.  Neck: Trachea normal. No tracheal tenderness present. No tracheal deviation present. No thyromegaly present.  Cardiovascular: Normal rate, regular rhythm, S1 normal, S2 normal and normal heart sounds.   No murmur heard. Pulmonary/Chest: Breath sounds normal. No stridor. No respiratory distress. She has no wheezes. She has no rales.  Musculoskeletal: She  exhibits no edema.  Lymphadenopathy:       Head (right side): No tonsillar adenopathy present.       Head (left side): No tonsillar adenopathy present.    She has no cervical adenopathy.  Neurological: She is alert. Gait normal.  Skin: No rash noted. She is not diaphoretic. No erythema. Nails show no clubbing.  Psychiatric: Mood and affect normal.    Diagnostics:    Spirometry was performed and demonstrated an FEV1 of 1.59 at 82 % of predicted.  Assessment and Plan:   1. Asthma, moderate persistent, well-controlled   2. Other allergic rhinitis   3. Gastroesophageal reflux disease, esophagitis presence not specified   4. Toxic effect of venom of bees, unintentional, subsequent encounter     1. Continue Dulera 200 2 inhalations + Qvar 80 2 inhalations two times per day  2. Continue Fluticasone 1-2 sprays each nostril two times per day  3. Continue montelukast 58m one tablet one time per day  4. Continue omeprazole 40 one tablet twice a day  5. Continue Proair HFA, Xyzal, Pataday if needed.  6. Continue immunotherapy for venoms and aeroallergens   7. Can use nasal saline multiple times per day if needed.  8. Can use OTC mucinex DM twice a day if needed  9. Return to clinic in 6 months or earlier if problem  PAdrieanaappears to be doing relatively well while using a very large collection of anti-inflammatory medications for her respiratory tract and addressing the issue with her reflux. Hopefully now that were out of the wintertime cold and flu season she will not obtain any additional exacerbations of her lung disease requiring her to use a systemic steroid or antibiotic. She will continue on immunotherapy as well as noted above. She has the option of using some nasal saline for her nasal stuffiness and she can add in Mucinex DM for some of her lingering cough. I'll see her back in this clinic in 6 months or earlier if there is a problem.  EAllena Katz MD Allergy /  Immunology [facility]

## 2016-04-02 NOTE — Patient Instructions (Addendum)
  1. Continue Dulera 200 2 inhalations + Qvar 80 2 inhalations two times per day  2. Continue Fluticasone 1-2 sprays each nostril two times per day  3. Continue montelukast 10mg  one tablet one time per day  4. Continue omeprazole 40 one tablet twice a day  5. Continue Proair HFA, Xyzal, Pataday if needed.  6. Continue immunotherapy for venoms and aeroallergens   7. Can use nasal saline multiple times per day if needed.  8. Can use OTC mucinex DM twice a day if needed  9. Return to clinic in 6 months or earlier if problem

## 2016-04-02 NOTE — Telephone Encounter (Signed)
Pt said she lost her lab order,would you send it downstairs so she can get lab work when she comes in today please.

## 2016-04-03 LAB — LIPID PANEL
Cholesterol: 123 mg/dL (ref <200)
HDL: 38 mg/dL — ABNORMAL LOW (ref >50)
LDL Cholesterol: 54 mg/dL (ref <100)
Total CHOL/HDL Ratio: 3.2 Ratio (ref <5.0)
Triglycerides: 154 mg/dL — ABNORMAL HIGH (ref <150)
VLDL: 31 mg/dL — ABNORMAL HIGH (ref <30)

## 2016-04-05 ENCOUNTER — Ambulatory Visit: Payer: Medicare HMO | Admitting: Internal Medicine

## 2016-04-05 ENCOUNTER — Telehealth: Payer: Self-pay | Admitting: Internal Medicine

## 2016-04-05 DIAGNOSIS — M7542 Impingement syndrome of left shoulder: Secondary | ICD-10-CM | POA: Insufficient documentation

## 2016-04-05 NOTE — Telephone Encounter (Signed)
New Message  Pt voiced as to why she has to have a carotid when she had one on 1.10.18.  Please f/u with pt

## 2016-04-05 NOTE — Telephone Encounter (Signed)
Returned call to patient she stated she is scheduled for carotid dopplers 04/19/16.Stated she just had done 02/2016.Advised she is due to have repeated in 1 year.I will cancel 04/26/16 and will tell scheduler to schedule for 02/2017.Keep follow up appointment scheduled with Dr.Hilty 04/26/16 at 11:45 am.

## 2016-04-19 ENCOUNTER — Encounter (HOSPITAL_COMMUNITY): Payer: Medicare HMO

## 2016-04-23 ENCOUNTER — Other Ambulatory Visit (HOSPITAL_BASED_OUTPATIENT_CLINIC_OR_DEPARTMENT_OTHER): Payer: Medicare HMO

## 2016-04-23 ENCOUNTER — Telehealth: Payer: Self-pay | Admitting: *Deleted

## 2016-04-23 DIAGNOSIS — D472 Monoclonal gammopathy: Secondary | ICD-10-CM | POA: Diagnosis not present

## 2016-04-23 LAB — COMPREHENSIVE METABOLIC PANEL
ALT: 24 U/L (ref 0–55)
AST: 19 U/L (ref 5–34)
Albumin: 3.9 g/dL (ref 3.5–5.0)
Alkaline Phosphatase: 65 U/L (ref 40–150)
Anion Gap: 10 mEq/L (ref 3–11)
BUN: 6.9 mg/dL — ABNORMAL LOW (ref 7.0–26.0)
CO2: 28 mEq/L (ref 22–29)
Calcium: 9.7 mg/dL (ref 8.4–10.4)
Chloride: 102 mEq/L (ref 98–109)
Creatinine: 0.9 mg/dL (ref 0.6–1.1)
EGFR: 72 mL/min/{1.73_m2} — ABNORMAL LOW (ref >90)
Glucose: 156 mg/dl — ABNORMAL HIGH (ref 70–140)
Potassium: 2.8 mEq/L — CL (ref 3.5–5.1)
Sodium: 140 mEq/L (ref 136–145)
Total Bilirubin: 0.53 mg/dL (ref 0.20–1.20)
Total Protein: 8 g/dL (ref 6.4–8.3)

## 2016-04-23 LAB — CBC WITH DIFFERENTIAL/PLATELET
BASO%: 1.1 % (ref 0.0–2.0)
Basophils Absolute: 0.1 10*3/uL (ref 0.0–0.1)
EOS%: 4 % (ref 0.0–7.0)
Eosinophils Absolute: 0.2 10*3/uL (ref 0.0–0.5)
HCT: 34.6 % — ABNORMAL LOW (ref 34.8–46.6)
HGB: 11.6 g/dL (ref 11.6–15.9)
LYMPH%: 31.2 % (ref 14.0–49.7)
MCH: 27.3 pg (ref 25.1–34.0)
MCHC: 33.4 g/dL (ref 31.5–36.0)
MCV: 81.9 fL (ref 79.5–101.0)
MONO#: 0.6 10*3/uL (ref 0.1–0.9)
MONO%: 10.8 % (ref 0.0–14.0)
NEUT#: 2.8 10*3/uL (ref 1.5–6.5)
NEUT%: 52.9 % (ref 38.4–76.8)
Platelets: 186 10*3/uL (ref 145–400)
RBC: 4.23 10*6/uL (ref 3.70–5.45)
RDW: 13.9 % (ref 11.2–14.5)
WBC: 5.3 10*3/uL (ref 3.9–10.3)
lymph#: 1.6 10*3/uL (ref 0.9–3.3)

## 2016-04-23 NOTE — Telephone Encounter (Signed)
Notified to take potassium 20 meg twice a day. States she has only been taking once per day.

## 2016-04-23 NOTE — Telephone Encounter (Signed)
Attempted to call patient, no answer- machine full Potassium level is 2.8  LM for son to call us or have patient call us

## 2016-04-23 NOTE — Telephone Encounter (Signed)
Need to ask patient if she is taking her potassium. Needs to be taking 20 mg twice a day for 1 week.  Has an appt with Dr Alvy Bimler on 3/27

## 2016-04-24 ENCOUNTER — Ambulatory Visit (INDEPENDENT_AMBULATORY_CARE_PROVIDER_SITE_OTHER): Payer: Medicare HMO | Admitting: Orthopaedic Surgery

## 2016-04-24 LAB — KAPPA/LAMBDA LIGHT CHAINS
Ig Kappa Free Light Chain: 81.3 mg/L — ABNORMAL HIGH (ref 3.3–19.4)
Ig Lambda Free Light Chain: 17.1 mg/L (ref 5.7–26.3)
Kappa/Lambda FluidC Ratio: 4.75 — ABNORMAL HIGH (ref 0.26–1.65)

## 2016-04-25 LAB — MULTIPLE MYELOMA PANEL, SERUM
Albumin SerPl Elph-Mcnc: 3.5 g/dL (ref 2.9–4.4)
Albumin/Glob SerPl: 0.9 (ref 0.7–1.7)
Alpha 1: 0.2 g/dL (ref 0.0–0.4)
Alpha2 Glob SerPl Elph-Mcnc: 0.8 g/dL (ref 0.4–1.0)
B-Globulin SerPl Elph-Mcnc: 1.1 g/dL (ref 0.7–1.3)
Gamma Glob SerPl Elph-Mcnc: 1.8 g/dL (ref 0.4–1.8)
Globulin, Total: 3.9 g/dL (ref 2.2–3.9)
IgA, Qn, Serum: 85 mg/dL (ref 64–422)
IgG, Qn, Serum: 1790 mg/dL — ABNORMAL HIGH (ref 700–1600)
IgM, Qn, Serum: 33 mg/dL (ref 26–217)
M Protein SerPl Elph-Mcnc: 1.4 g/dL — ABNORMAL HIGH
Total Protein: 7.4 g/dL (ref 6.0–8.5)

## 2016-04-26 ENCOUNTER — Ambulatory Visit (INDEPENDENT_AMBULATORY_CARE_PROVIDER_SITE_OTHER): Payer: Medicare HMO | Admitting: Internal Medicine

## 2016-04-26 VITALS — BP 128/64 | HR 78 | Ht 65.0 in | Wt 196.2 lb

## 2016-04-26 DIAGNOSIS — I10 Essential (primary) hypertension: Secondary | ICD-10-CM | POA: Diagnosis not present

## 2016-04-26 DIAGNOSIS — I739 Peripheral vascular disease, unspecified: Secondary | ICD-10-CM

## 2016-04-26 DIAGNOSIS — I447 Left bundle-branch block, unspecified: Secondary | ICD-10-CM

## 2016-04-26 DIAGNOSIS — Z9989 Dependence on other enabling machines and devices: Secondary | ICD-10-CM

## 2016-04-26 DIAGNOSIS — E785 Hyperlipidemia, unspecified: Secondary | ICD-10-CM | POA: Diagnosis not present

## 2016-04-26 DIAGNOSIS — I779 Disorder of arteries and arterioles, unspecified: Secondary | ICD-10-CM

## 2016-04-26 DIAGNOSIS — G4733 Obstructive sleep apnea (adult) (pediatric): Secondary | ICD-10-CM

## 2016-04-26 NOTE — Progress Notes (Signed)
OFFICE NOTE  Chief Complaint:  No complaints  Primary Care Physician: Darden Amber, PA  HPI:  Andrea Lowery is a 76 year old female, history of some lower extremity edema, asthma, seasonal allergies, and a left bundle branch block. There was no evidence for ischemia based on stress testing in 2012. The main issue today is she was told recently that she was having worsening problems with snoring and witnessed episodes of apnea at night. She says her sleep is poor. She often feels fatigued throughout the day. Occasionally wakes up with headaches in the morning. Apparently, she underwent a sleep study in 2008 or so and that was negative; however, her weight was about 150 to 160 at the time. Since she has stopped smoking and gained about 40 pounds. I was concerned about sleep apnea and sent her for a sleep study which demonstrated significant sleep apnea. She was fitted with a mask and is doing well.  I have also readjusted her medications, switching her to Diovan HCTZ and decreasing her amlodipine, which has since been discontinued totally. She also recently underwent extensive dental work by Dr. Buelah Manis.  This included moderate sedation and was without complication.  She returns today feeling fairly well. EKG continues to show persistent left bundle branch block.  Andrea Lowery returns today for followup appointment. She was just seen by Cecilie Kicks, nurse practitioner, in our office for hospital followup. She presented to the hospital with shortness of breath and was told she was in congestive heart failure. A chest x-ray showed borderline cardiomegaly with early interstitial edema. A BNP was only mildly elevated at 170. She was told she had significant heart failure and there was concern about her persistent left bundle branch block which is well-documented and is nonischemic. She was discharged and in followup was given additional Lasix and lost 2 pounds. She reports her  breathing is back to normal although she still has persistent leg swelling. She is not good at reducing salt in her diet in fact eats a good amount of it. In addition, she is not on beta blocker.  A repeat echocardiogram was performed today, and demonstrated preserved systolic function with an EF of 55-60%, no significant valvular disease, and diastolic dysfunction.  I saw Andrea Lowery back today in the office. Her main complaint still is about ankle swelling. She has nonpitting edema mostly over the lateral malleolus bilaterally. Seems to be slightly worse in the right than the left. She denies any worsening shortness of breath or chest pain. She tells me that her primary care provider decrease her antihypertensive medications for unknown reasons, specifically valsartan was cut in half. She continues to have mild hypokalemia on low-dose repletion. She also was switched to pravastatin but is interested in going back to Crestor.  Andrea Lowery returns today for hospital follow-up. He was recently hospitalized and had mild diastolic heart failure exacerbation. In addition she was having headaches and thought to have possible temporal arteritis, however a temporal biopsy was negative. Subsequently she has been given additional medication for headaches. From a cardiology standpoint, she appears to be quite euvolemic on her current dose of diuretics, which had increased her to most recently. She reports some swelling around the right and left lateral malleoli, which does not appear to be edema.  01/19/2016  Andrea Lowery returns today for follow-up. Since I last saw her she reports no worsening shortness of breath or chest pain. She does have bilateral carotid artery disease which would require follow-up. Blood pressure  is noted to be elevated today 167/94. For some reason her valsartan and HCTZ was decreased from the 320/12.5 mg dose to the 160/12.5 mg dose. She is also on Lasix. She denies any significant  lower extremity edema.  02/26/2016  Andrea Lowery was seen today in follow-up. Her blood pressure is now much better controlled 120/80. This is likely due to better compliance with medications. She does report however that compliance with Crestor has been minimal, only taking 3-4 doses per week. Recent lab work indicated total cholesterol of 190, temperature is 144, HDL-C 46 and LDL-C 115. We discussed that her goal LDL should be much lower less than 70. Even if she were to take that medicine on a daily basis is not likely she will reach her goal. I felt like she may benefit from the addition of ezetimibe to her current regimen.  04/26/2016  Andrea Lowery was seen in follow-up today. After adding ezetimibe to her current therapy Crestor 40 mg, her LDL C now is significantly reduced to 54, down from 115. She had repeat carotid Dopplers which showed moderate stable bilateral carotid artery disease. Will repeat the studies in one year.   PMHx:  Past Medical History:  Diagnosis Date  . Arthritis   . Asthma   . Diabetes mellitus without complication ([place])    Diabetes II  . DVT (deep venous thrombosis) ([place])    "Years ago"  . Dyslipidemia   . GERD (gastroesophageal reflux disease)   . Headache    Migraine  . Hypertension   . Hypothyroidism   . LBBB (left bundle branch block)   . MGUS (monoclonal gammopathy of unknown significance)   . MGUS (monoclonal gammopathy of unknown significance)   . Migraine   . OSA on CPAP     Past Surgical History:  Procedure Laterality Date  . ABDOMINAL HYSTERECTOMY  1969  . ARTERY BIOPSY N/A 11/07/2014   Procedure: BIOPSY TEMPORAL ARTERY;  Surgeon: Leta Baptist, MD;  Location: [facility];  Service: ENT;  Laterality: N/A;  . CARPAL TUNNEL RELEASE Right   . CHOLECYSTECTOMY  1976  . LUMBAR LAMINECTOMY/DECOMPRESSION MICRODISCECTOMY N/A 07/31/2015   Procedure: L4-5 Decompression, Possible Right L4-5 Microdiscectomy;  Surgeon: Marybelle Killings, MD;  Location: [facility];  Service:  Orthopedics;  Laterality: N/A;  . NM MYOVIEW LTD  02/13/2010   No ischemia  . PLANTAR FASCIA RELEASE    . TONSILLECTOMY  1965  . US ECHOCARDIOGRAPHY  09/20/2008   borderline LVH,mild TR,AOV mildly sclerotic w/ca+ of the leaflets    FAMHx:  Family History  Problem Relation Age of Onset  . Diabetes Father   . Heart failure Father   . Thyroid disease Sister     SOCHx:   reports that she quit smoking about 10 years ago. She has never used smokeless tobacco. She reports that she does not drink alcohol or use drugs.  ALLERGIES:  Allergies  Allergen Reactions  . Bee Venom Anaphylaxis    Yellow jackets  . Penicillins Hives    Has patient had a PCN reaction causing immediate rash, facial/tongue/throat swelling, SOB or lightheadedness with hypotension: Yes Has patient had a PCN reaction causing severe rash involving mucus membranes or skin necrosis: No Has patient had a PCN reaction that required hospitalization No Has patient had a PCN reaction occurring within the last 10 years: No If all of the above answers are "NO", then may proceed with Cephalosporin use.   Marland Kitchen Oxycodone Other (See Comments)    Hallucinations  ROS: Pertinent items noted in HPI and remainder of comprehensive ROS otherwise negative.  HOME MEDS: Current Outpatient Prescriptions  Medication Sig Dispense Refill  . albuterol (PROVENTIL HFA;VENTOLIN HFA) 108 (90 BASE) MCG/ACT inhaler Inhale 2 puffs into the lungs every 4 (four) hours as needed for wheezing or shortness of breath.     Marland Kitchen alendronate (FOSAMAX) 70 MG tablet Take 70 mg by mouth every Wednesday.     . beclomethasone (QVAR) 80 MCG/ACT inhaler Inhale 2 puffs into the lungs 2 (two) times daily.    . Blood Glucose Monitoring Suppl (ACCU-CHEK AVIVA PLUS) w/Device KIT by Does not apply route.    . Calcium Carb-Cholecalciferol (CALCIUM 600 + D PO) Take 2 tablets by mouth daily.    . cetirizine (ZYRTEC) 10 MG tablet Take 10 mg by mouth daily.     . clindamycin  (CLEOCIN) 150 MG capsule Take 1 capsule (150 mg total) by mouth every 6 (six) hours. 28 capsule 0  . cycloSPORINE (RESTASIS) 0.05 % ophthalmic emulsion Place 1 drop into both eyes at bedtime.     Marland Kitchen EPINEPHrine 0.3 mg/0.3 mL IJ SOAJ injection Use as directed for severe allergic reaction 2 Device 1  . esomeprazole (NEXIUM) 40 MG capsule Take 40 mg by mouth 2 (two) times daily.     Marland Kitchen ezetimibe (ZETIA) 10 MG tablet Take 1 tablet (10 mg total) by mouth daily. 90 tablet 3  . fluticasone (FLONASE) 50 MCG/ACT nasal spray Place 2 sprays into both nostrils as needed for allergies.     Marland Kitchen levothyroxine (SYNTHROID, LEVOTHROID) 50 MCG tablet take 1 tablet by mouth once daily (Patient taking differently: take 1 tablet (50 mcg) by mouth once daily) 90 tablet 0  . metoprolol succinate (TOPROL-XL) 25 MG 24 hr tablet Take 25 mg by mouth daily.    . mometasone-formoterol (DULERA) 100-5 MCG/ACT AERO Inhale 2 puffs into the lungs as needed for wheezing or shortness of breath.     . montelukast (SINGULAIR) 10 MG tablet Take 10 mg by mouth at bedtime.    . Olopatadine HCl (PATADAY) 0.2 % SOLN Apply 1 drop to eye daily as needed (Allergies).     . potassium chloride SA (K-DUR,KLOR-CON) 20 MEQ tablet Take 20 mEq by mouth daily.     . rosuvastatin (CRESTOR) 40 MG tablet Take 1 tablet (40 mg total) by mouth daily. Need appointment before anymore refills 90 tablet 3  . valsartan-hydrochlorothiazide (DIOVAN-HCT) 320-12.5 MG tablet Take 1 tablet by mouth daily. 30 tablet 5   No current facility-administered medications for this visit.     LABS/IMAGING: No results found for this or any previous visit (from the past 48 hour(s)). No results found.  VITALS: BP 128/64 (BP Location: Right Arm, Patient Position: Sitting, Cuff Size: Normal)   Pulse 78   Ht 5' 5" (1.651 m)   Wt 196 lb 3.2 oz (89 kg)   BMI 32.65 kg/m   EXAM: General appearance: alert and no distress Lungs: clear to auscultation bilaterally Heart: regular  rate and rhythm, S1, S2 normal, no murmur, click, rub or gallop Extremities: extremities normal, atraumatic, no cyanosis or edema Neurologic: Grossly normal  EKG: Normal sinus rhythm 78, LBBB  ASSESSMENT: 1. Left bundle branch block 2. Dyslipidemia 3. Hypertension 4. Mild dyspnea on exertion 5. Obstructive sleep apnea on CPAP 6. Diastolic dysfunction with recent mild acute congestive heart failure, EF 55-60% 7. Moderate bilateral carotid artery disease 8. Lipidemia at the ankles  PLAN: 1.   Mrs. Rawles's pressures at  goal and her cholesterol is now much improved on her current regimen. Will continue her current medications. Plan to see her back annually with a repeat carotid Dopplers at that time.  Pixie Casino, MD, [facility] Attending Cardiologist Lawrence C Hilty 04/26/2016, 1:00 PM

## 2016-04-26 NOTE — Patient Instructions (Signed)
Your physician wants you to follow-up in: 6 months with Dr. Hilty. You will receive a reminder letter in the mail two months in advance. If you don't receive a letter, please call our office to schedule the follow-up appointment.    

## 2016-04-30 ENCOUNTER — Encounter: Payer: Self-pay | Admitting: Hematology and Oncology

## 2016-04-30 ENCOUNTER — Ambulatory Visit (HOSPITAL_BASED_OUTPATIENT_CLINIC_OR_DEPARTMENT_OTHER): Payer: Medicare HMO | Admitting: Hematology and Oncology

## 2016-04-30 ENCOUNTER — Telehealth: Payer: Self-pay | Admitting: Hematology and Oncology

## 2016-04-30 VITALS — BP 132/56 | HR 75 | Temp 98.6°F | Resp 18 | Ht 65.0 in | Wt 195.9 lb

## 2016-04-30 DIAGNOSIS — E876 Hypokalemia: Secondary | ICD-10-CM | POA: Diagnosis not present

## 2016-04-30 DIAGNOSIS — D472 Monoclonal gammopathy: Secondary | ICD-10-CM

## 2016-04-30 HISTORY — DX: Hypokalemia: E87.6

## 2016-04-30 NOTE — Assessment & Plan Note (Signed)
She had recent hypokalemia, likely due to her medications She was given potassium replacement therapy and I recommend close follow-up with primary care doctor for close monitoring

## 2016-04-30 NOTE — Telephone Encounter (Signed)
Gave patient AVS and calender per 04/30/2016 los.  

## 2016-04-30 NOTE — Assessment & Plan Note (Signed)
Clinically, she has no signs of disease progression. Her M spike and IgG kappa levels are stable I will see her back in 12 months with repeat blood work to be done the week ahead of time so that we can have results before see her back.

## 2016-04-30 NOTE — Progress Notes (Signed)
[facility] OFFICE PROGRESS NOTE  Patient Care Team: Darden Amber, PA as PCP - General  SUMMARY OF ONCOLOGIC HISTORY:  I reviewed the patient's records extensive and collaborated the history with the patient. Summary of her history is as follows: She has a long-standing history of multiple arthralgias, left CMC pain, OA of the bilateral knees, hands and feet and osteoporosis for which she is on Prolia previously. She had SPEP drawn on 02/19/2013 that revealed a restricted band with monoclonal protein present, IgG kappa subtype. The monoclonal protein peak accounted for 1.38 g/dL of the total 1.66 g/dL of protein in the gamma region. Her CMP demonstrated a creatinine of 0.81 and calcium of 9.0 and total protein of 7.2 and albumin of 3.9; Her CBC was within normal limits with a hemoglobin of 12.5. Skeletal x-ray show no evidence of lytic lesions. She had a bone marrow biopsy on 06/22/2013 which show 10% of plasma cells. She was observed.  INTERVAL HISTORY: Please see below for problem oriented charting. She returns for further follow-up She continues to have chronic, musculoskeletal pain Denies recent fractures No recent infection. Appetite is stable, denies recent weight loss Denies recent cardiac issues such as chest pain or shortness of breath  REVIEW OF SYSTEMS:   Constitutional: Denies fevers, chills or abnormal weight loss Eyes: Denies blurriness of vision Ears, nose, mouth, throat, and face: Denies mucositis or sore throat Respiratory: Denies cough, dyspnea or wheezes Cardiovascular: Denies palpitation, chest discomfort or lower extremity swelling Gastrointestinal:  Denies nausea, heartburn or change in bowel habits Skin: Denies abnormal skin rashes Lymphatics: Denies new lymphadenopathy or easy bruising Neurological:Denies numbness, tingling or new weaknesses Behavioral/Psych: Mood is stable, no new changes  All other systems were reviewed with the patient and are  negative.  I have reviewed the past medical history, past surgical history, social history and family history with the patient and they are unchanged from previous note.  ALLERGIES:  is allergic to bee venom; penicillins; morphine and related; and oxycodone.  MEDICATIONS:  Current Outpatient Prescriptions  Medication Sig Dispense Refill  . albuterol (PROVENTIL HFA;VENTOLIN HFA) 108 (90 BASE) MCG/ACT inhaler Inhale 2 puffs into the lungs every 4 (four) hours as needed for wheezing or shortness of breath.     Marland Kitchen alendronate (FOSAMAX) 70 MG tablet Take 70 mg by mouth every Wednesday.     . beclomethasone (QVAR) 80 MCG/ACT inhaler Inhale 2 puffs into the lungs 2 (two) times daily.    . Blood Glucose Monitoring Suppl (ACCU-CHEK AVIVA PLUS) w/Device KIT by Does not apply route.    . Calcium Carb-Cholecalciferol (CALCIUM 600 + D PO) Take 2 tablets by mouth daily.    . cetirizine (ZYRTEC) 10 MG tablet Take 10 mg by mouth daily.     . cycloSPORINE (RESTASIS) 0.05 % ophthalmic emulsion Place 1 drop into both eyes at bedtime.     Marland Kitchen esomeprazole (NEXIUM) 40 MG capsule Take 40 mg by mouth 2 (two) times daily.     Marland Kitchen ezetimibe (ZETIA) 10 MG tablet Take 1 tablet (10 mg total) by mouth daily. 90 tablet 3  . fluticasone (FLONASE) 50 MCG/ACT nasal spray Place 2 sprays into both nostrils as needed for allergies.     Marland Kitchen levothyroxine (SYNTHROID, LEVOTHROID) 50 MCG tablet take 1 tablet by mouth once daily (Patient taking differently: take 1 tablet (50 mcg) by mouth once daily) 90 tablet 0  . metoprolol succinate (TOPROL-XL) 25 MG 24 hr tablet Take 25 mg by mouth daily.    Marland Kitchen  mometasone-formoterol (DULERA) 100-5 MCG/ACT AERO Inhale 2 puffs into the lungs as needed for wheezing or shortness of breath.     . montelukast (SINGULAIR) 10 MG tablet Take 10 mg by mouth at bedtime.    . Olopatadine HCl (PATADAY) 0.2 % SOLN Apply 1 drop to eye daily as needed (Allergies).     . potassium chloride SA (K-DUR,KLOR-CON) 20 MEQ  tablet Take 20 mEq by mouth daily.     . rosuvastatin (CRESTOR) 40 MG tablet Take 1 tablet (40 mg total) by mouth daily. Need appointment before anymore refills 90 tablet 3  . valsartan-hydrochlorothiazide (DIOVAN-HCT) 320-12.5 MG tablet Take 1 tablet by mouth daily. 30 tablet 5  . EPINEPHrine 0.3 mg/0.3 mL IJ SOAJ injection Use as directed for severe allergic reaction (Patient not taking: Reported on 04/30/2016) 2 Device 1   No current facility-administered medications for this visit.     PHYSICAL EXAMINATION: ECOG PERFORMANCE STATUS: 0 - Asymptomatic  Vitals:   04/30/16 1128  BP: (!) 132/56  Pulse: 75  Resp: 18  Temp: 98.6 F (37 C)   Filed Weights   04/30/16 1128  Weight: 195 lb 14.4 oz (88.9 kg)    GENERAL:alert, no distress and comfortable SKIN: skin color, texture, turgor are normal, no rashes or significant lesions EYES: normal, Conjunctiva are pink and non-injected, sclera clear OROPHARYNX:no exudate, no erythema and lips, buccal mucosa, and tongue normal  NECK: supple, thyroid normal size, non-tender, without nodularity LYMPH:  no palpable lymphadenopathy in the cervical, axillary or inguinal LUNGS: clear to auscultation and percussion with normal breathing effort HEART: regular rate & rhythm and no murmurs and no lower extremity edema ABDOMEN:abdomen soft, non-tender and normal bowel sounds Musculoskeletal:no cyanosis of digits and no clubbing  NEURO: alert & oriented x 3 with fluent speech, no focal motor/sensory deficits  LABORATORY DATA:  I have reviewed the data as listed    Component Value Date/Time   NA 140 04/23/2016 1303   K 2.8 (LL) 04/23/2016 1303   CL 106 02/12/2016 0946   CO2 28 04/23/2016 1303   GLUCOSE 156 (H) 04/23/2016 1303   BUN 6.9 (L) 04/23/2016 1303   CREATININE 0.9 04/23/2016 1303   CALCIUM 9.7 04/23/2016 1303   PROT 7.4 04/23/2016 1303   PROT 8.0 04/23/2016 1303   ALBUMIN 3.9 04/23/2016 1303   AST 19 04/23/2016 1303   ALT 24  04/23/2016 1303   ALKPHOS 65 04/23/2016 1303   BILITOT 0.53 04/23/2016 1303   GFRNONAA >60 12/29/2015 2025   GFRAA >60 12/29/2015 2025    No results found for: SPEP, UPEP  Lab Results  Component Value Date   WBC 5.3 04/23/2016   NEUTROABS 2.8 04/23/2016   HGB 11.6 04/23/2016   HCT 34.6 (L) 04/23/2016   MCV 81.9 04/23/2016   PLT 186 04/23/2016      Chemistry      Component Value Date/Time   NA 140 04/23/2016 1303   K 2.8 (LL) 04/23/2016 1303   CL 106 02/12/2016 0946   CO2 28 04/23/2016 1303   BUN 6.9 (L) 04/23/2016 1303   CREATININE 0.9 04/23/2016 1303      Component Value Date/Time   CALCIUM 9.7 04/23/2016 1303   ALKPHOS 65 04/23/2016 1303   AST 19 04/23/2016 1303   ALT 24 04/23/2016 1303   BILITOT 0.53 04/23/2016 1303      ASSESSMENT & PLAN:  MGUS (monoclonal gammopathy of unknown significance) Clinically, she has no signs of disease progression. Her M spike and IgG  kappa levels are stable I will see her back in 12 months with repeat blood work to be done the week ahead of time so that we can have results before see her back.   Hypokalemia She had recent hypokalemia, likely due to her medications She was given potassium replacement therapy and I recommend close follow-up with primary care doctor for close monitoring   Orders Placed This Encounter  Procedures  . Comprehensive metabolic panel    Standing Status:   Future    Standing Expiration Date:   06/04/2017  . CBC with Differential/Platelet    Standing Status:   Future    Standing Expiration Date:   06/04/2017  . Kappa/lambda light chains    Standing Status:   Future    Standing Expiration Date:   06/04/2017  . Multiple Myeloma Panel (SPEP&IFE w/QIG)    Standing Status:   Future    Standing Expiration Date:   06/04/2017   All questions were answered. The patient knows to call the clinic with any problems, questions or concerns. No barriers to learning was detected. I spent 15 minutes counseling the  patient face to face. The total time spent in the appointment was 20 minutes and more than 50% was on counseling and review of test results     Heath Lark, MD 04/30/2016 12:41 PM

## 2016-05-14 ENCOUNTER — Ambulatory Visit (INDEPENDENT_AMBULATORY_CARE_PROVIDER_SITE_OTHER): Payer: Medicare HMO | Admitting: *Deleted

## 2016-05-14 DIAGNOSIS — J309 Allergic rhinitis, unspecified: Secondary | ICD-10-CM | POA: Diagnosis not present

## 2016-05-17 ENCOUNTER — Ambulatory Visit (INDEPENDENT_AMBULATORY_CARE_PROVIDER_SITE_OTHER): Payer: Medicare HMO | Admitting: *Deleted

## 2016-05-17 DIAGNOSIS — T63441D Toxic effect of venom of bees, accidental (unintentional), subsequent encounter: Secondary | ICD-10-CM | POA: Diagnosis not present

## 2016-05-21 ENCOUNTER — Ambulatory Visit (HOSPITAL_COMMUNITY)
Admission: EM | Admit: 2016-05-21 | Discharge: 2016-05-21 | Disposition: A | Discharge status: Home / Self Care | Payer: Medicare HMO

## 2016-05-21 ENCOUNTER — Ambulatory Visit (INDEPENDENT_AMBULATORY_CARE_PROVIDER_SITE_OTHER): Payer: Medicare HMO | Admitting: *Deleted

## 2016-05-21 DIAGNOSIS — J309 Allergic rhinitis, unspecified: Secondary | ICD-10-CM

## 2016-05-29 ENCOUNTER — Encounter: Payer: Self-pay | Admitting: *Deleted

## 2016-05-29 NOTE — Progress Notes (Signed)
Maintenance vial made

## 2016-05-30 DIAGNOSIS — J3089 Other allergic rhinitis: Secondary | ICD-10-CM | POA: Diagnosis not present

## 2016-05-30 NOTE — Addendum Note (Signed)
Addended by: Katherina Right D on: 05/30/2016 03:35 PM   Modules accepted: Orders

## 2016-05-31 ENCOUNTER — Ambulatory Visit (INDEPENDENT_AMBULATORY_CARE_PROVIDER_SITE_OTHER): Payer: Medicare HMO

## 2016-05-31 DIAGNOSIS — J309 Allergic rhinitis, unspecified: Secondary | ICD-10-CM

## 2016-05-31 DIAGNOSIS — J301 Allergic rhinitis due to pollen: Secondary | ICD-10-CM | POA: Diagnosis not present

## 2016-06-06 ENCOUNTER — Ambulatory Visit (INDEPENDENT_AMBULATORY_CARE_PROVIDER_SITE_OTHER): Payer: Medicare HMO

## 2016-06-06 DIAGNOSIS — J309 Allergic rhinitis, unspecified: Secondary | ICD-10-CM | POA: Diagnosis not present

## 2016-06-12 ENCOUNTER — Ambulatory Visit (INDEPENDENT_AMBULATORY_CARE_PROVIDER_SITE_OTHER): Payer: Medicare HMO | Admitting: *Deleted

## 2016-06-12 DIAGNOSIS — J309 Allergic rhinitis, unspecified: Secondary | ICD-10-CM | POA: Diagnosis not present

## 2016-06-19 ENCOUNTER — Ambulatory Visit (INDEPENDENT_AMBULATORY_CARE_PROVIDER_SITE_OTHER): Payer: Medicare HMO | Admitting: *Deleted

## 2016-06-19 DIAGNOSIS — J309 Allergic rhinitis, unspecified: Secondary | ICD-10-CM | POA: Diagnosis not present

## 2016-06-24 ENCOUNTER — Ambulatory Visit (INDEPENDENT_AMBULATORY_CARE_PROVIDER_SITE_OTHER): Payer: Medicare HMO | Admitting: *Deleted

## 2016-06-24 DIAGNOSIS — J309 Allergic rhinitis, unspecified: Secondary | ICD-10-CM

## 2016-06-27 ENCOUNTER — Ambulatory Visit (INDEPENDENT_AMBULATORY_CARE_PROVIDER_SITE_OTHER): Payer: Medicare HMO | Admitting: *Deleted

## 2016-06-27 DIAGNOSIS — T63441D Toxic effect of venom of bees, accidental (unintentional), subsequent encounter: Secondary | ICD-10-CM

## 2016-07-03 ENCOUNTER — Ambulatory Visit (INDEPENDENT_AMBULATORY_CARE_PROVIDER_SITE_OTHER): Payer: Medicare HMO | Admitting: *Deleted

## 2016-07-03 DIAGNOSIS — J309 Allergic rhinitis, unspecified: Secondary | ICD-10-CM | POA: Diagnosis not present

## 2016-07-09 ENCOUNTER — Encounter (HOSPITAL_COMMUNITY): Payer: Self-pay | Admitting: Nurse Practitioner

## 2016-07-09 ENCOUNTER — Emergency Department (HOSPITAL_COMMUNITY)
Admission: EM | Admit: 2016-07-09 | Discharge: 2016-07-09 | Disposition: A | Discharge status: Home / Self Care | Payer: Medicare HMO | Attending: Emergency Medicine | Admitting: Emergency Medicine

## 2016-07-09 ENCOUNTER — Emergency Department (HOSPITAL_COMMUNITY): Payer: Medicare HMO

## 2016-07-09 DIAGNOSIS — I110 Hypertensive heart disease with heart failure: Secondary | ICD-10-CM | POA: Insufficient documentation

## 2016-07-09 DIAGNOSIS — E039 Hypothyroidism, unspecified: Secondary | ICD-10-CM | POA: Diagnosis not present

## 2016-07-09 DIAGNOSIS — J45901 Unspecified asthma with (acute) exacerbation: Secondary | ICD-10-CM | POA: Diagnosis not present

## 2016-07-09 DIAGNOSIS — Z79899 Other long term (current) drug therapy: Secondary | ICD-10-CM | POA: Diagnosis not present

## 2016-07-09 DIAGNOSIS — E119 Type 2 diabetes mellitus without complications: Secondary | ICD-10-CM | POA: Insufficient documentation

## 2016-07-09 DIAGNOSIS — J320 Chronic maxillary sinusitis: Secondary | ICD-10-CM

## 2016-07-09 DIAGNOSIS — J0100 Acute maxillary sinusitis, unspecified: Secondary | ICD-10-CM | POA: Diagnosis not present

## 2016-07-09 DIAGNOSIS — I5031 Acute diastolic (congestive) heart failure: Secondary | ICD-10-CM | POA: Insufficient documentation

## 2016-07-09 DIAGNOSIS — R0602 Shortness of breath: Secondary | ICD-10-CM | POA: Diagnosis present

## 2016-07-09 MED ORDER — ALBUTEROL SULFATE (2.5 MG/3ML) 0.083% IN NEBU
5.0000 mg | INHALATION_SOLUTION | Freq: Once | RESPIRATORY_TRACT | Status: AC
  Administered 2016-07-09: 5 mg via RESPIRATORY_TRACT
  Filled 2016-07-09: qty 6

## 2016-07-09 MED ORDER — IPRATROPIUM BROMIDE 0.02 % IN SOLN
0.5000 mg | Freq: Once | RESPIRATORY_TRACT | Status: AC
  Administered 2016-07-09: 0.5 mg via RESPIRATORY_TRACT
  Filled 2016-07-09: qty 2.5

## 2016-07-09 MED ORDER — ALBUTEROL SULFATE (2.5 MG/3ML) 0.083% IN NEBU: 5.0000 mg | INHALATION_SOLUTION | Freq: Once | RESPIRATORY_TRACT | Status: DC

## 2016-07-09 MED ORDER — PREDNISONE 20 MG PO TABS: 40.0000 mg | ORAL_TABLET | Freq: Every day | ORAL | 0 refills | Status: DC

## 2016-07-09 MED ORDER — PREDNISONE 20 MG PO TABS
50.0000 mg | ORAL_TABLET | Freq: Once | ORAL | Status: AC
  Administered 2016-07-09: 50 mg via ORAL
  Filled 2016-07-09: qty 3

## 2016-07-09 MED ORDER — DOXYCYCLINE HYCLATE 100 MG PO CAPS: 100.0000 mg | ORAL_CAPSULE | Freq: Two times a day (BID) | ORAL | 0 refills | Status: DC

## 2016-07-09 NOTE — ED Triage Notes (Addendum)
Pt presents with c/o SOB. She woke this morning with wheezing and SOB. She reports facial pain, cough. She denies fevers, chills, chest pain. She has taken her qvar, dulera, proair, singulair with no relief. She feels this is an asthma flare up

## 2016-07-09 NOTE — ED Notes (Signed)
RN picked up pt from the waiting room, radiology picked her up on the way to a room.

## 2016-07-09 NOTE — ED Provider Notes (Signed)
[facility] DEPT Provider Note   CSN: [id number] Arrival date & time: 07/09/16  1545     History   Chief Complaint Chief Complaint  Patient presents with  . Shortness of Breath    HPI Andrea Lowery is a 76 y.o. female.  HPI Patient presents with shortness of breath. Worse today but has had some symptoms for last week. History of asthma. Has several different treatments at home. No fevers. Has had a cough without any sputum production. However have sinus fullness on the left face. Some illness in her left ear also. No nasal drainage. No chest pain. Slight fatigue. Does not feel better after inhalers at home.   Past Medical History:  Diagnosis Date  . Arthritis   . Asthma   . Diabetes mellitus without complication ([place])    Diabetes II  . DVT (deep venous thrombosis) ([place])    "Years ago"  . Dyslipidemia   . GERD (gastroesophageal reflux disease)   . Headache    Migraine  . Hypertension   . Hypothyroidism   . LBBB (left bundle branch block)   . MGUS (monoclonal gammopathy of unknown significance)   . MGUS (monoclonal gammopathy of unknown significance)   . Migraine   . OSA on CPAP     Patient Active Problem List   Diagnosis Date Noted  . Hypokalemia 04/30/2016  . Impingement syndrome of left shoulder 04/05/2016  . Carotid artery disease ([place]) 01/19/2016  . Dyslipidemia 01/19/2016  . History of lumbar laminectomy for spinal cord decompression 07/31/2015  . Chronic pain of right hip 05/02/2015  . Temporal arteritis ([place]) 11/07/2014  . Headache 11/07/2014  . Diabetes mellitus type 2, controlled ([place]) 11/07/2014  . Diastolic CHF ([place]) 37/34/2876  . Sacroiliac dysfunction 11/04/2014  . Trochanteric bursitis of right hip 11/04/2014  . Asthma 10/05/2014  . GERD (gastroesophageal reflux disease) 10/05/2014  . Allergic rhinitis 10/05/2014  . Multinodular goiter 08/09/2014  . Disorder of sacroiliac joint 07/29/2014  . 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 Hypersomnolence 04/16/2014  . Snoring 04/16/2014  . Preventive measure 01/25/2014  . Acute diastolic CHF (congestive heart failure), NYHA class 2 ([place]) 07/29/2013  . Bilateral leg edema 07/20/2013  . 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 Essential hypertension 07/01/2012  . LBBB (left bundle branch block) 07/01/2012  . DOE (dyspnea on exertion) 07/01/2012  . OSA on CPAP 07/01/2012    Past Surgical History:  Procedure Laterality Date  . ABDOMINAL HYSTERECTOMY  1969  . ARTERY BIOPSY N/A 11/07/2014   Procedure: BIOPSY TEMPORAL ARTERY;  Surgeon: Leta Baptist, MD;  Location: [facility];  Service: ENT;  Laterality: N/A;  . CARPAL TUNNEL RELEASE Right   . CHOLECYSTECTOMY  1976  . LUMBAR LAMINECTOMY/DECOMPRESSION MICRODISCECTOMY N/A 07/31/2015   Procedure: L4-5 Decompression, Possible Right L4-5 Microdiscectomy;  Surgeon: Marybelle Killings, MD;  Location: [facility];  Service: Orthopedics;  Laterality: N/A;  . NM MYOVIEW LTD  02/13/2010   No ischemia  . PLANTAR FASCIA RELEASE    . TONSILLECTOMY  1965  . US ECHOCARDIOGRAPHY  09/20/2008   borderline LVH,mild TR,AOV mildly sclerotic w/ca+ of the leaflets    OB History    No data available       Home Medications    Prior to Admission medications   Medication Sig Start Date End Date Taking? Authorizing Provider  albuterol (PROVENTIL HFA;VENTOLIN HFA) 108 (90 BASE) MCG/ACT inhaler Inhale 2 puffs into the lungs every 4 (four) hours  as needed for wheezing or shortness of breath.    Yes [provider]  alendronate (FOSAMAX) 70 MG tablet Take 70 mg by mouth every Wednesday.  05/30/15  Yes [provider]  beclomethasone (QVAR) 80 MCG/ACT inhaler Inhale 2 puffs into the lungs 2 (two) times daily.   Yes [provider]  Calcium Carb-Cholecalciferol (CALCIUM 600 + D PO) Take 600 mg by mouth daily.    Yes [provider]  cetirizine (ZYRTEC) 10 MG tablet Take 10  mg by mouth daily.    Yes [provider]  cycloSPORINE (RESTASIS) 0.05 % ophthalmic emulsion Place 1 drop into both eyes at bedtime.    Yes [provider]  EPINEPHrine 0.3 mg/0.3 mL IJ SOAJ injection Use as directed for severe allergic reaction 08/28/15  Yes Kozlow, Donnamarie Poag, MD  esomeprazole (NEXIUM) 40 MG capsule Take 40 mg by mouth 2 (two) times daily.    Yes [provider]  ezetimibe (ZETIA) 10 MG tablet Take 1 tablet (10 mg total) by mouth daily. Patient taking differently: Take 10 mg by mouth every evening.  02/26/16 07/09/16 Yes Hilty, Nadean Corwin, MD  fluticasone (FLONASE) 50 MCG/ACT nasal spray Place 2 sprays into both nostrils as needed for allergies.  12/27/13  Yes [provider]  levothyroxine (SYNTHROID, LEVOTHROID) 50 MCG tablet take 1 tablet by mouth once daily Patient taking differently: take 1 tablet (50 mcg) by mouth once daily 08/04/15  Yes Renato Shin, MD  metoprolol succinate (TOPROL-XL) 25 MG 24 hr tablet Take 25 mg by mouth daily.   Yes [provider]  mometasone-formoterol (DULERA) 100-5 MCG/ACT AERO Inhale 2 puffs into the lungs as needed for wheezing or shortness of breath.    Yes [provider]  montelukast (SINGULAIR) 10 MG tablet Take 10 mg by mouth at bedtime.   Yes [provider]  potassium chloride SA (K-DUR,KLOR-CON) 20 MEQ tablet Take 20 mEq by mouth daily.  10/21/14  Yes [provider]  rosuvastatin (CRESTOR) 40 MG tablet Take 1 tablet (40 mg total) by mouth daily. Need appointment before anymore refills Patient taking differently: Take 40 mg by mouth every evening. Need appointment before anymore refills 02/26/16  Yes Hilty, Nadean Corwin, MD  valsartan-hydrochlorothiazide (DIOVAN-HCT) 320-12.5 MG tablet Take 1 tablet by mouth daily. 01/18/16  Yes Hilty, Nadean Corwin, MD  doxycycline (VIBRAMYCIN) 100 MG capsule Take 1 capsule (100 mg total) by mouth 2 (two) times daily. 07/09/16   Davonna Belling, MD    predniSONE (DELTASONE) 20 MG tablet Take 2 tablets (40 mg total) by mouth daily. 07/10/16   Davonna Belling, MD    Family History Family History  Problem Relation Age of Onset  . Diabetes Father   . Heart failure Father   . Thyroid disease Sister     Social History Social History  Substance Use Topics  . Smoking status: Former Smoker    Quit date: 02/04/2006  . Smokeless tobacco: Never Used  . Alcohol use No     Allergies   Bee venom and Penicillins   Review of Systems Review of Systems  Constitutional: Negative for appetite change.  HENT: Positive for sinus pain and sinus pressure.   Respiratory: Positive for choking and wheezing.   Cardiovascular: Negative for chest pain.  Gastrointestinal: Negative for abdominal distention.  Genitourinary: Negative for dysuria.  Musculoskeletal: Negative for back pain.  Skin: Negative for rash.  Neurological: Negative for syncope.  Hematological: Negative for adenopathy.  Psychiatric/Behavioral: Negative for confusion.  Physical Exam Updated Vital Signs BP 140/70 (BP Location: Left Arm)   Pulse 80   Temp 98.6 F (37 C) (Oral)   Resp 19   SpO2 95%   Physical Exam  Constitutional: She appears well-developed.  HENT:  Head: Atraumatic.  Left TM normal. Tenderness over left maxillary sinus. No skin changes. Eye movements intact.  Cardiovascular: Normal rate.   Pulmonary/Chest:  Mild diffuse wheezes and prolonged expirations. No respiratory distress. Sitting comfortably on side of bed.  Abdominal: Soft.  Musculoskeletal: She exhibits no edema.  Neurological: She is alert.  Skin: Skin is warm. Capillary refill takes less than 2 seconds.  Psychiatric: She has a normal mood and affect.     ED Treatments / Results  Labs (all labs ordered are listed, but only abnormal results are displayed) Labs Reviewed - No data to display  EKG  EKG Interpretation  Date/Time:  Tuesday July 09 2016 16:10:16 EDT Ventricular Rate:   70 PR Interval:  182 QRS Duration: 142 QT Interval:  428 QTC Calculation: 462 R Axis:   62 Text Interpretation:  Normal sinus rhythm Left bundle branch block Abnormal ECG No significant change since last tracing Confirmed by Alvino Chapel  MD, Ovid Curd [phone]) on 07/09/2016 5:26:47 PM       Radiology Dg Chest 2 View  Result Date: 07/09/2016 CLINICAL DATA:  76 year old with acute onset of cough, shortness of breath and wheezing that began earlier this morning, associated with mid chest pain. Current history of hypertension and asthma. EXAM: CHEST  2 VIEW COMPARISON:  12/29/2015, 10/21/2015 and earlier. FINDINGS: Cardiac silhouette normal in size, unchanged. Thoracic aorta mildly atherosclerotic, unchanged. Hilar and mediastinal contours otherwise unremarkable. Prominent bronchovascular markings diffusely and moderate central peribronchial thickening, more so than on the prior examinations. Lungs otherwise clear. No localized airspace consolidation. No pleural effusions. No pneumothorax. Normal pulmonary vascularity. Degenerative changes involving the thoracic spine. Osseous demineralization. IMPRESSION: Moderate changes of acute bronchitis and/or asthma without focal airspace pneumonia. Electronically Signed   By: Evangeline Dakin M.D.   On: 07/09/2016 17:14    Procedures Procedures (including critical care time)  Medications Ordered in ED Medications  predniSONE (DELTASONE) tablet 50 mg (50 mg Oral Given 07/09/16 1738)  albuterol (PROVENTIL) (2.5 MG/3ML) 0.083% nebulizer solution 5 mg (5 mg Nebulization Given 07/09/16 1739)  ipratropium (ATROVENT) nebulizer solution 0.5 mg (0.5 mg Nebulization Given 07/09/16 1738)     Initial Impression / Assessment and Plan / ED Course  I have reviewed the triage vital signs and the nursing notes.  Pertinent labs & imaging results that were available during my care of the patient were reviewed by me and considered in my medical decision making (see chart for  details).     Patient with likely asthma exacerbation. Wheezing is improved after treatment. X-ray shows chronic bronchitis versus asthma. Does however have sinus symptoms have been there for 3 weeks and tender over left maxillary sinus. Will treat with antibiotics. Feels better after treatment will be discharged home.  Final Clinical Impressions(s) / ED Diagnoses   Final diagnoses:  Exacerbation of asthma, unspecified asthma severity, unspecified whether persistent  Maxillary sinusitis, unspecified chronicity    New Prescriptions New Prescriptions   DOXYCYCLINE (VIBRAMYCIN) 100 MG CAPSULE    Take 1 capsule (100 mg total) by mouth 2 (two) times daily.   PREDNISONE (DELTASONE) 20 MG TABLET    Take 2 tablets (40 mg total) by mouth daily.     Davonna Belling, MD 07/09/16 [phone]

## 2016-07-15 ENCOUNTER — Other Ambulatory Visit: Payer: Self-pay | Admitting: Internal Medicine

## 2016-08-01 ENCOUNTER — Ambulatory Visit (INDEPENDENT_AMBULATORY_CARE_PROVIDER_SITE_OTHER): Payer: Medicare HMO | Admitting: *Deleted

## 2016-08-01 DIAGNOSIS — J309 Allergic rhinitis, unspecified: Secondary | ICD-10-CM

## 2016-08-02 DIAGNOSIS — M2550 Pain in unspecified joint: Secondary | ICD-10-CM | POA: Insufficient documentation

## 2016-08-02 DIAGNOSIS — L309 Dermatitis, unspecified: Secondary | ICD-10-CM | POA: Insufficient documentation

## 2016-08-09 ENCOUNTER — Ambulatory Visit (INDEPENDENT_AMBULATORY_CARE_PROVIDER_SITE_OTHER): Payer: Medicare HMO

## 2016-08-09 DIAGNOSIS — J309 Allergic rhinitis, unspecified: Secondary | ICD-10-CM

## 2016-08-12 ENCOUNTER — Ambulatory Visit: Payer: Self-pay

## 2016-08-14 ENCOUNTER — Ambulatory Visit (INDEPENDENT_AMBULATORY_CARE_PROVIDER_SITE_OTHER): Payer: Medicare HMO | Admitting: *Deleted

## 2016-08-14 DIAGNOSIS — T63441D Toxic effect of venom of bees, accidental (unintentional), subsequent encounter: Secondary | ICD-10-CM | POA: Diagnosis not present

## 2016-08-30 DIAGNOSIS — J301 Allergic rhinitis due to pollen: Secondary | ICD-10-CM | POA: Diagnosis not present

## 2016-08-31 ENCOUNTER — Ambulatory Visit (HOSPITAL_COMMUNITY)
Admission: EM | Admit: 2016-08-31 | Discharge: 2016-08-31 | Disposition: A | Discharge status: Home / Self Care | Payer: Medicare HMO | Attending: Family Medicine | Admitting: Family Medicine

## 2016-08-31 ENCOUNTER — Encounter (HOSPITAL_COMMUNITY): Payer: Self-pay | Admitting: Emergency Medicine

## 2016-08-31 DIAGNOSIS — R21 Rash and other nonspecific skin eruption: Secondary | ICD-10-CM

## 2016-08-31 DIAGNOSIS — W57XXXA Bitten or stung by nonvenomous insect and other nonvenomous arthropods, initial encounter: Secondary | ICD-10-CM | POA: Diagnosis not present

## 2016-08-31 DIAGNOSIS — S00462A Insect bite (nonvenomous) of left ear, initial encounter: Secondary | ICD-10-CM

## 2016-08-31 MED ORDER — TRIAMCINOLONE ACETONIDE 0.1 % EX CREA: 1.0000 "application " | TOPICAL_CREAM | Freq: Two times a day (BID) | CUTANEOUS | 0 refills | Status: DC

## 2016-08-31 NOTE — ED Provider Notes (Signed)
CSN: [id number]     Arrival date & time 08/31/16  1901 History   None    Chief Complaint  Patient presents with  . Insect Bite   (Consider location/radiation/quality/duration/timing/severity/associated sxs/prior Treatment) 76 year old female presents to clinic with a chief complaint of itching to the left ear. States that she woke up this morning with this symptom, believe she had an insect bite overnight. She did not see what bit her. Not painful, however very itchy, no systemic symptoms, no shortness of breath, wheezing, trouble swallowing, fever, chills, etc. Otherwise no complaints.  Family history, social history, surgical history, and past medical history reviewed and updated as appropriate.   The history is provided by the patient.    Past Medical History:  Diagnosis Date  . Arthritis   . Asthma   . Diabetes mellitus without complication ([place])    Diabetes II  . DVT (deep venous thrombosis) ([place])    "Years ago"  . Dyslipidemia   . GERD (gastroesophageal reflux disease)   . Headache    Migraine  . Hypertension   . Hypothyroidism   . LBBB (left bundle branch block)   . MGUS (monoclonal gammopathy of unknown significance)   . MGUS (monoclonal gammopathy of unknown significance)   . Migraine   . OSA on CPAP    Past Surgical History:  Procedure Laterality Date  . ABDOMINAL HYSTERECTOMY  1969  . ARTERY BIOPSY N/A 11/07/2014   Procedure: BIOPSY TEMPORAL ARTERY;  Surgeon: Leta Baptist, MD;  Location: [facility];  Service: ENT;  Laterality: N/A;  . CARPAL TUNNEL RELEASE Right   . CHOLECYSTECTOMY  1976  . LUMBAR LAMINECTOMY/DECOMPRESSION MICRODISCECTOMY N/A 07/31/2015   Procedure: L4-5 Decompression, Possible Right L4-5 Microdiscectomy;  Surgeon: Marybelle Killings, MD;  Location: [facility];  Service: Orthopedics;  Laterality: N/A;  . NM MYOVIEW LTD  02/13/2010   No ischemia  . PLANTAR FASCIA RELEASE    . TONSILLECTOMY  1965  . US ECHOCARDIOGRAPHY  09/20/2008   borderline LVH,mild TR,AOV mildly  sclerotic w/ca+ of the leaflets   Family History  Problem Relation Age of Onset  . Diabetes Father   . Heart failure Father   . Hypertension Father   . Thyroid disease Sister   . Hypertension Sister    Social History  Substance Use Topics  . Smoking status: Former Smoker    Quit date: 02/04/2006  . Smokeless tobacco: Never Used  . Alcohol use No   OB History    No data available     Review of Systems  Constitutional: Negative.   HENT: Positive for ear pain (external).   Respiratory: Negative.   Cardiovascular: Negative.   Gastrointestinal: Negative.   Musculoskeletal: Negative.   Skin: Positive for rash.  Neurological: Negative.     Allergies  Bee venom and Penicillins  Home Medications   Prior to Admission medications   Medication Sig Start Date End Date Taking? Authorizing Provider  albuterol (PROVENTIL HFA;VENTOLIN HFA) 108 (90 BASE) MCG/ACT inhaler Inhale 2 puffs into the lungs every 4 (four) hours as needed for wheezing or shortness of breath.     [provider]  alendronate (FOSAMAX) 70 MG tablet Take 70 mg by mouth every Wednesday.  05/30/15   [provider]  beclomethasone (QVAR) 80 MCG/ACT inhaler Inhale 2 puffs into the lungs 2 (two) times daily.    [provider]  Calcium Carb-Cholecalciferol (CALCIUM 600 + D PO) Take 600 mg by mouth daily.     [provider]  cetirizine (ZYRTEC) 10 MG tablet Take 10 mg by mouth daily.     [provider]  cycloSPORINE (RESTASIS) 0.05 % ophthalmic emulsion Place 1 drop into both eyes at bedtime.     [provider]  EPINEPHrine 0.3 mg/0.3 mL IJ SOAJ injection Use as directed for severe allergic reaction 08/28/15   Kozlow, Donnamarie Poag, MD  esomeprazole (NEXIUM) 40 MG capsule Take 40 mg by mouth 2 (two) times daily.     [provider]  ezetimibe (ZETIA) 10 MG tablet Take 1 tablet (10 mg total) by mouth daily. Patient taking differently: Take 10 mg by mouth every  evening.  02/26/16 07/09/16  Hilty, Nadean Corwin, MD  fluticasone (FLONASE) 50 MCG/ACT nasal spray Place 2 sprays into both nostrils as needed for allergies.  12/27/13   [provider]  levothyroxine (SYNTHROID, LEVOTHROID) 50 MCG tablet take 1 tablet by mouth once daily Patient taking differently: take 1 tablet (50 mcg) by mouth once daily 08/04/15   Renato Shin, MD  metoprolol succinate (TOPROL-XL) 25 MG 24 hr tablet Take 25 mg by mouth daily.    [provider]  mometasone-formoterol (DULERA) 100-5 MCG/ACT AERO Inhale 2 puffs into the lungs as needed for wheezing or shortness of breath.     [provider]  montelukast (SINGULAIR) 10 MG tablet Take 10 mg by mouth at bedtime.    [provider]  potassium chloride SA (K-DUR,KLOR-CON) 20 MEQ tablet Take 20 mEq by mouth daily.  10/21/14   [provider]  predniSONE (DELTASONE) 20 MG tablet Take 2 tablets (40 mg total) by mouth daily. 07/10/16   Davonna Belling, MD  rosuvastatin (CRESTOR) 40 MG tablet Take 1 tablet (40 mg total) by mouth daily. Need appointment before anymore refills Patient taking differently: Take 40 mg by mouth every evening. Need appointment before anymore refills 02/26/16   Pixie Casino, MD  triamcinolone cream (KENALOG) 0.1 % Apply 1 application topically 2 (two) times daily. 08/31/16   Barnet Glasgow, NP  valsartan-hydrochlorothiazide (DIOVAN-HCT) 320-12.5 MG tablet take 1 tablet by mouth once daily 07/15/16   Hilty, Nadean Corwin, MD   Meds Ordered and Administered this Visit  Medications - No data to display  BP (!) 176/88 (BP Location: Left Arm)   Pulse 95   Temp 98.5 F (36.9 C) (Oral)   Resp 18   SpO2 97%  No data found.   Physical Exam  Constitutional: She is oriented to person, place, and time. She appears well-developed and well-nourished. No distress.  HENT:  Head: Normocephalic and atraumatic.  Right Ear: Tympanic membrane and external ear normal.  Left Ear:  Tympanic membrane normal. There is swelling (Erythema and swelling of the left tragus).  Eyes: Conjunctivae are normal.  Neck: Normal range of motion.  Cardiovascular: Normal rate and regular rhythm.   Pulmonary/Chest: Effort normal and breath sounds normal.  Lymphadenopathy:       Head (right side): No submandibular, no tonsillar, no preauricular and no posterior auricular adenopathy present.       Head (left side): No submandibular, no tonsillar, no preauricular and no posterior auricular adenopathy present.    She has no cervical adenopathy.  Neurological: She is alert and oriented to person, place, and time.  Skin: Skin is warm and dry. Capillary refill takes less than 2 seconds. No rash noted. She is not diaphoretic. No erythema.  Psychiatric: She has a normal mood and affect. Her behavior is normal.  Nursing note and vitals reviewed.  Urgent Care Course     Procedures (including critical care time)  Labs Review Labs Reviewed - No data to display  Imaging Review No results found.      MDM   1. Insect bite, initial encounter    Given prescription for topical triamcinolone. 2 to t 3 times a day. Also take over-the-counter Benadryl, return to clinic as needed.    Barnet Glasgow, NP 08/31/16 1948

## 2016-08-31 NOTE — Discharge Instructions (Signed)
Apply the cream 2-3 times a day as needed for itching, along with over the counter benadryl every 6 hours as needed. Return to clinic as needed if symptoms worsen

## 2016-08-31 NOTE — ED Triage Notes (Signed)
The patient presented to the [facility] with a complaint of pain and swelling to her left ear lobe that she noticed today and believed to be from an insect bite.

## 2016-09-05 DIAGNOSIS — J3089 Other allergic rhinitis: Secondary | ICD-10-CM | POA: Diagnosis not present

## 2016-09-26 ENCOUNTER — Ambulatory Visit (INDEPENDENT_AMBULATORY_CARE_PROVIDER_SITE_OTHER): Payer: Medicare HMO | Admitting: *Deleted

## 2016-09-26 DIAGNOSIS — J309 Allergic rhinitis, unspecified: Secondary | ICD-10-CM | POA: Diagnosis not present

## 2016-09-26 IMAGING — US US SOFT TISSUE HEAD/NECK
1 series · 14 of 25 positions shown · non-contrast
Comparison: CT scan June 30, 2014.

CLINICAL DATA: Thyroid nodules.

EXAM:
THYROID ULTRASOUND
TECHNIQUE: Ultrasound examination of the thyroid gland and adjacent soft
tissues was performed.

[Series 1: us soft tissue head/neck · 0.07mm/px · 14 of 51 slices shown]
[im 1/51]
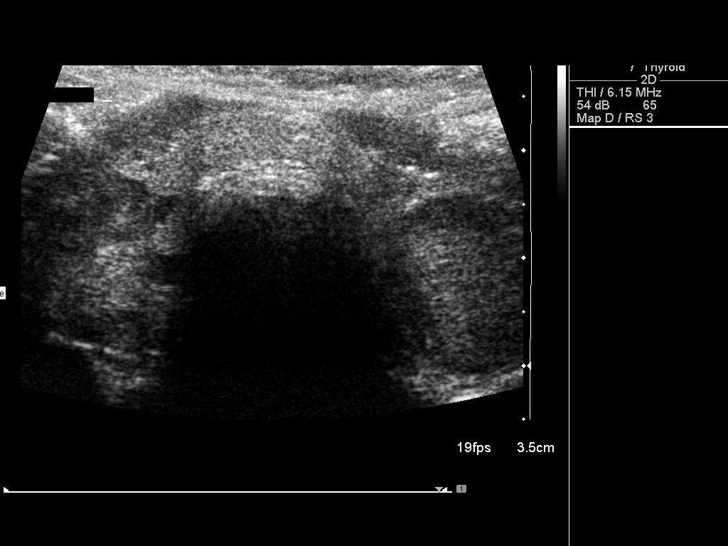
[im 5/51]
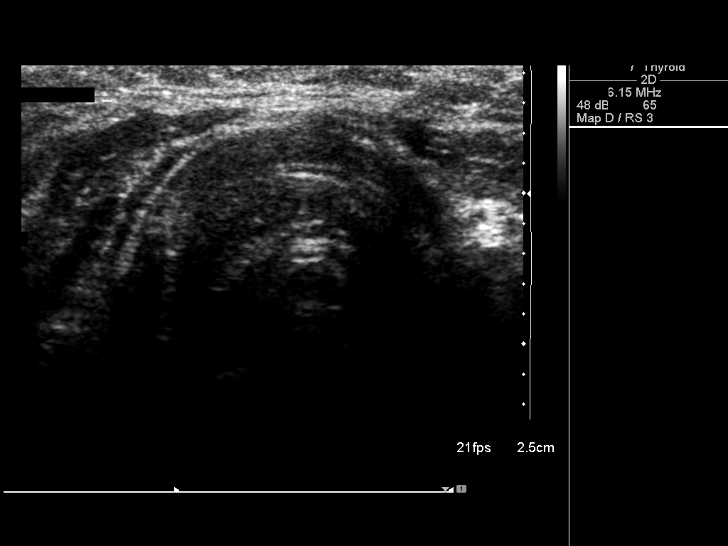
[im 9/51]
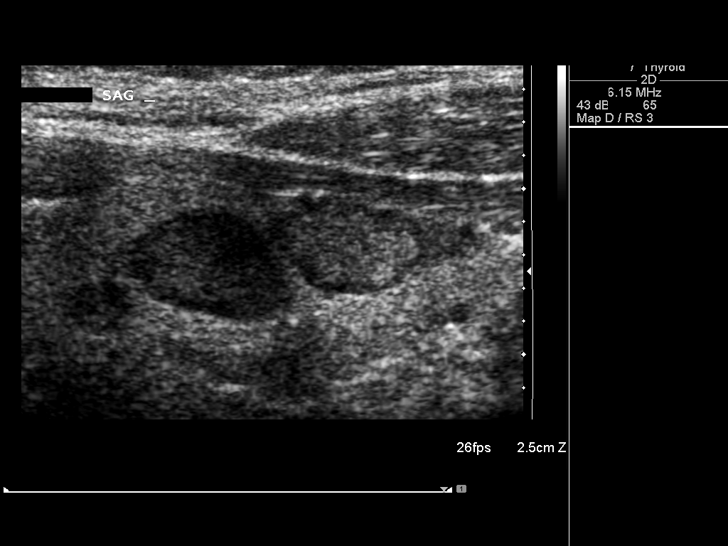
[im 13/51]
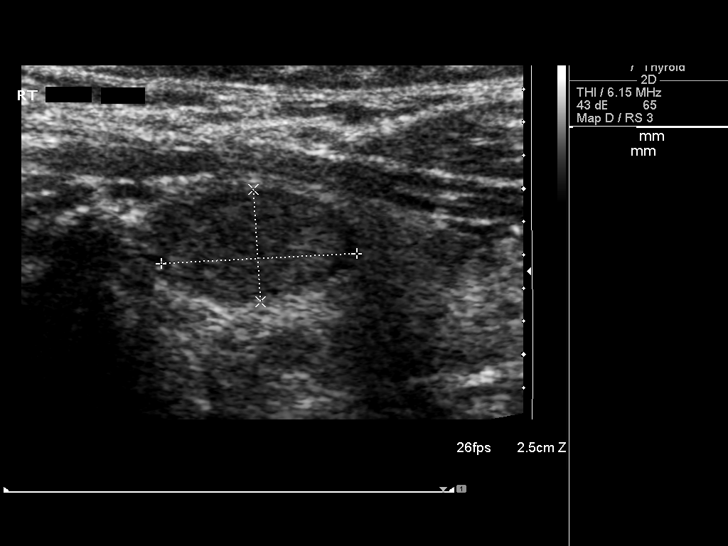
[im 17/51]
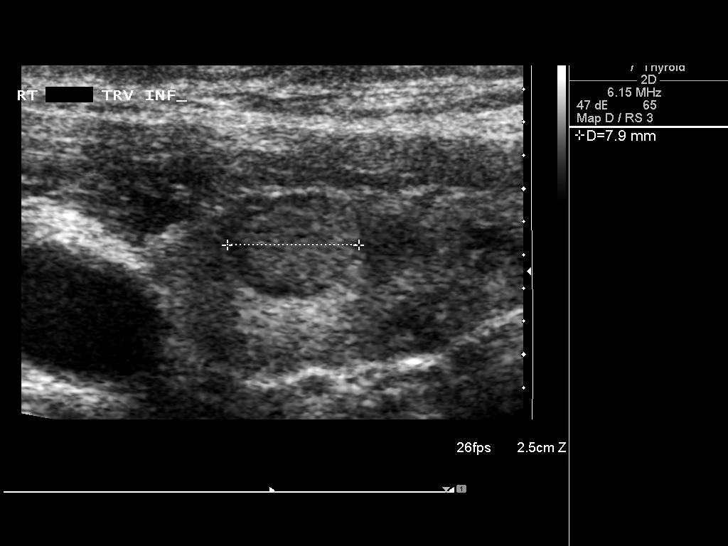
[im 19/51]
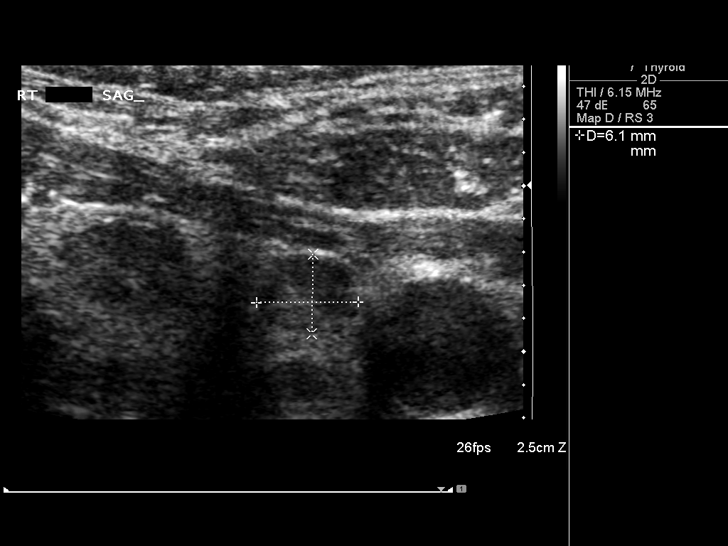
[im 23/51]
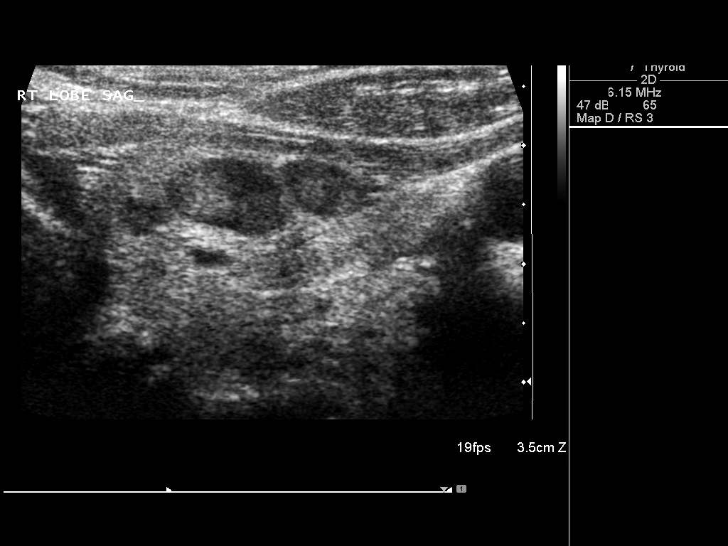
[im 28/51]
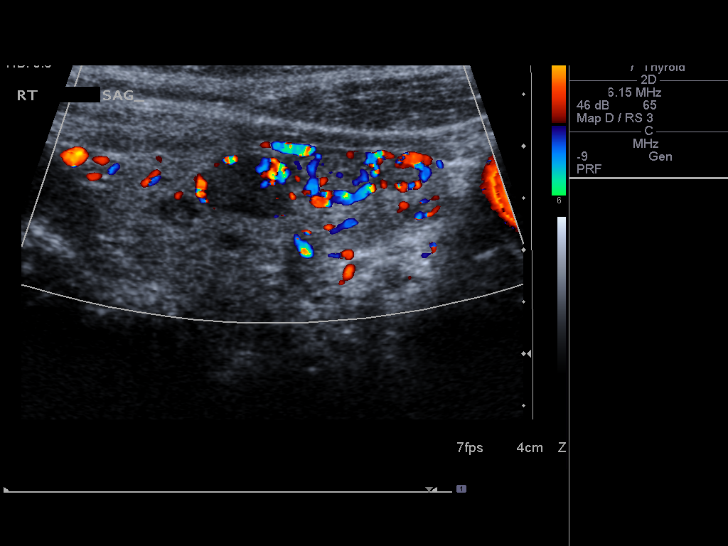
[im 32/51]
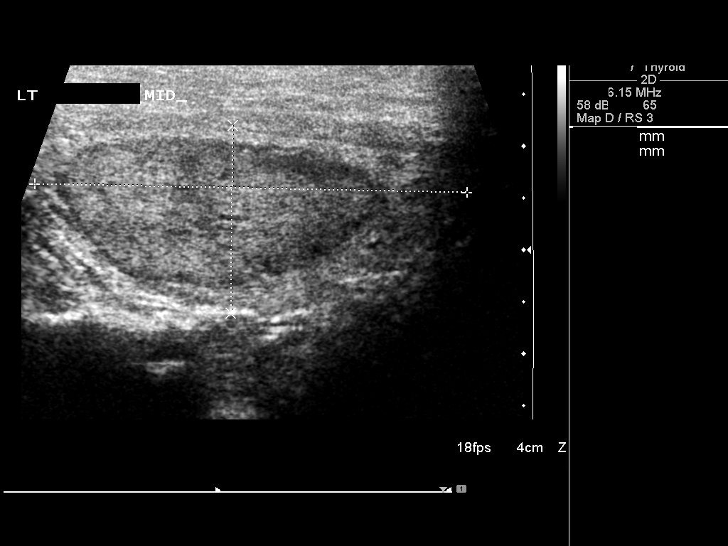
[im 34/51]
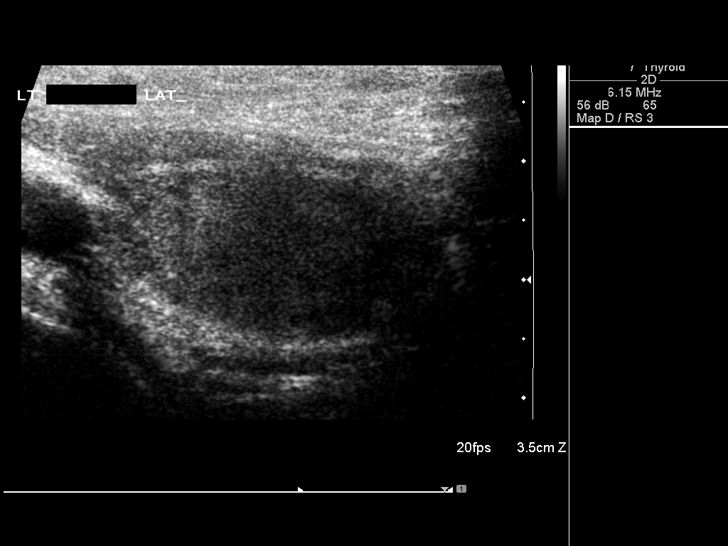
[im 38/51]
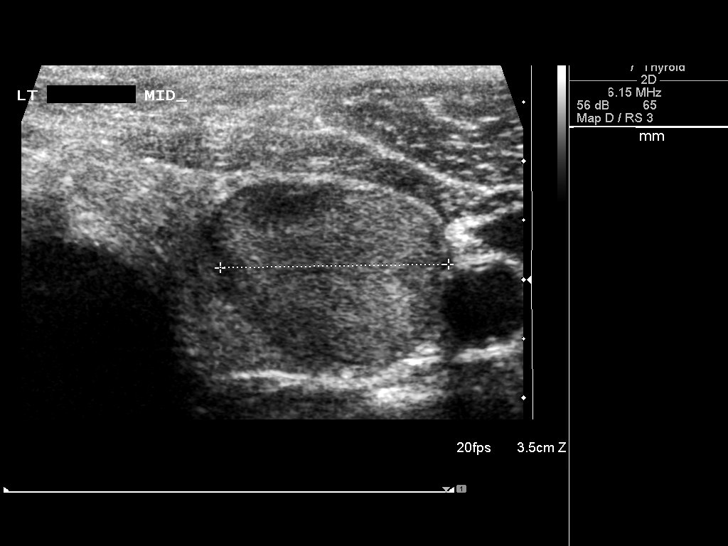
[im 42/51]
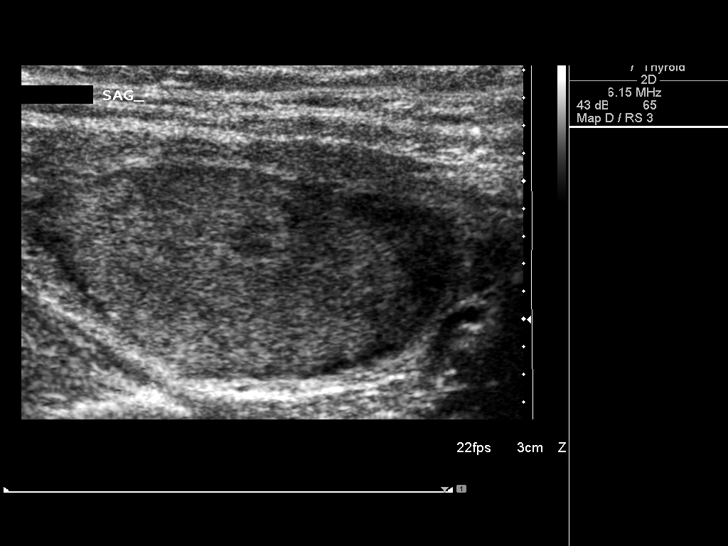
[im 46/51]
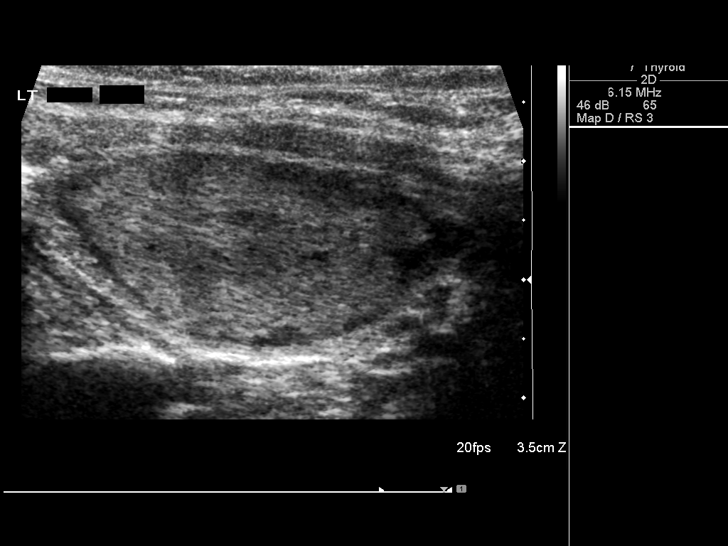
[im 51/51]
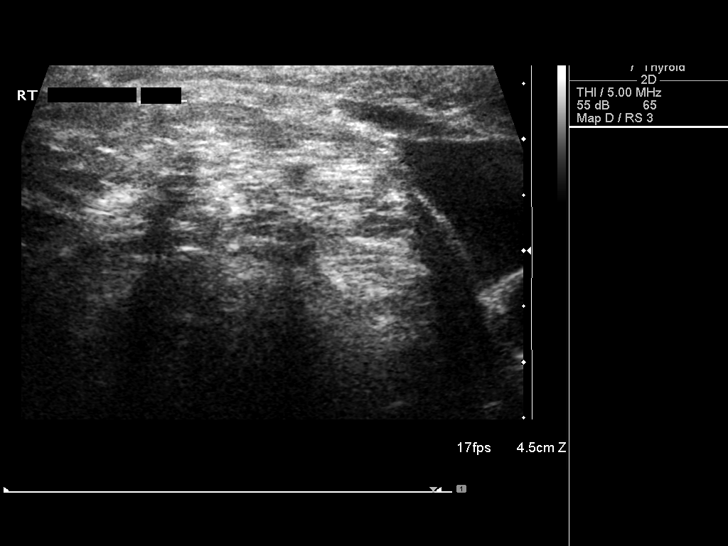

[14 of 25 positions shown; findings below may reference images not displayed]

FINDINGS: Right thyroid lobe

Measurements: 3.7 x 2.1 x 1.7 cm. Multiple nodules are noted with
the largest being solid nodule measuring 1.2 x 0.8 x 0.7 cm medially
in midpole.

Left thyroid lobe

Measurements: 4.2 x 2.3 x 1.8 cm. Dominant solid nodule measuring
3.2 x 1.9 x 1.6 cm is noted.

Isthmus

Thickness: 5 mm.  3 mm nodule is noted in midline.

Lymphadenopathy

None visualized.
IMPRESSION: Multiple nodules are noted bilaterally, with largest being 3.2 cm
solid nodule in left thyroid lobe. Findings meet consensus criteria
for biopsy. Ultrasound-guided fine needle aspiration should be
considered, as per the consensus statement: Management of Thyroid
Nodules Detected at US: Society of Radiologists in Ultrasound

## 2016-10-01 ENCOUNTER — Ambulatory Visit (INDEPENDENT_AMBULATORY_CARE_PROVIDER_SITE_OTHER): Payer: Medicare HMO | Admitting: *Deleted

## 2016-10-01 DIAGNOSIS — J309 Allergic rhinitis, unspecified: Secondary | ICD-10-CM | POA: Diagnosis not present

## 2016-10-03 ENCOUNTER — Ambulatory Visit (INDEPENDENT_AMBULATORY_CARE_PROVIDER_SITE_OTHER): Payer: Medicare HMO | Admitting: *Deleted

## 2016-10-03 DIAGNOSIS — T63441D Toxic effect of venom of bees, accidental (unintentional), subsequent encounter: Secondary | ICD-10-CM

## 2016-10-28 ENCOUNTER — Ambulatory Visit (INDEPENDENT_AMBULATORY_CARE_PROVIDER_SITE_OTHER): Payer: Medicare HMO | Admitting: *Deleted

## 2016-10-28 DIAGNOSIS — J309 Allergic rhinitis, unspecified: Secondary | ICD-10-CM | POA: Diagnosis not present

## 2016-11-04 ENCOUNTER — Ambulatory Visit (INDEPENDENT_AMBULATORY_CARE_PROVIDER_SITE_OTHER): Payer: Medicare HMO | Admitting: *Deleted

## 2016-11-04 DIAGNOSIS — J309 Allergic rhinitis, unspecified: Secondary | ICD-10-CM

## 2016-11-11 ENCOUNTER — Ambulatory Visit (INDEPENDENT_AMBULATORY_CARE_PROVIDER_SITE_OTHER): Payer: Medicare HMO | Admitting: *Deleted

## 2016-11-11 DIAGNOSIS — J309 Allergic rhinitis, unspecified: Secondary | ICD-10-CM

## 2016-11-20 ENCOUNTER — Ambulatory Visit (INDEPENDENT_AMBULATORY_CARE_PROVIDER_SITE_OTHER): Payer: Medicare HMO | Admitting: *Deleted

## 2016-11-20 DIAGNOSIS — J309 Allergic rhinitis, unspecified: Secondary | ICD-10-CM

## 2016-11-28 ENCOUNTER — Ambulatory Visit (INDEPENDENT_AMBULATORY_CARE_PROVIDER_SITE_OTHER): Payer: Medicare HMO | Admitting: *Deleted

## 2016-11-28 DIAGNOSIS — T63441D Toxic effect of venom of bees, accidental (unintentional), subsequent encounter: Secondary | ICD-10-CM

## 2016-12-05 ENCOUNTER — Ambulatory Visit (INDEPENDENT_AMBULATORY_CARE_PROVIDER_SITE_OTHER): Payer: Medicare HMO

## 2016-12-05 DIAGNOSIS — J309 Allergic rhinitis, unspecified: Secondary | ICD-10-CM

## 2016-12-30 ENCOUNTER — Ambulatory Visit (HOSPITAL_COMMUNITY)
Admission: EM | Admit: 2016-12-30 | Discharge: 2016-12-30 | Disposition: A | Discharge status: Home / Self Care | Payer: Medicare HMO | Attending: Emergency Medicine | Admitting: Emergency Medicine

## 2016-12-30 ENCOUNTER — Encounter (HOSPITAL_COMMUNITY): Payer: Self-pay | Admitting: Emergency Medicine

## 2016-12-30 DIAGNOSIS — R51 Headache: Secondary | ICD-10-CM | POA: Insufficient documentation

## 2016-12-30 DIAGNOSIS — R0981 Nasal congestion: Secondary | ICD-10-CM | POA: Insufficient documentation

## 2016-12-30 DIAGNOSIS — Z87891 Personal history of nicotine dependence: Secondary | ICD-10-CM | POA: Diagnosis not present

## 2016-12-30 DIAGNOSIS — J019 Acute sinusitis, unspecified: Secondary | ICD-10-CM | POA: Diagnosis not present

## 2016-12-30 DIAGNOSIS — Z79899 Other long term (current) drug therapy: Secondary | ICD-10-CM | POA: Diagnosis not present

## 2016-12-30 DIAGNOSIS — J029 Acute pharyngitis, unspecified: Secondary | ICD-10-CM | POA: Insufficient documentation

## 2016-12-30 DIAGNOSIS — Z880 Allergy status to penicillin: Secondary | ICD-10-CM | POA: Diagnosis not present

## 2016-12-30 LAB — SEDIMENTATION RATE: Sed Rate: 28 mm/hr — ABNORMAL HIGH (ref 0–22)

## 2016-12-30 MED ORDER — AZITHROMYCIN 250 MG PO TABS: 250.0000 mg | ORAL_TABLET | Freq: Every day | ORAL | 0 refills | Status: DC

## 2016-12-30 NOTE — Discharge Instructions (Addendum)
You likely having a viral upper respiratory infection. We recommended symptom control. I expect your symptoms to start improving in the next 1-2 weeks.   1. Take a daily allergy pill like Zyrtec, Claritin, or Store brand (anti-histamine) consistently for 2 weeks  2. For congestion you may try flonase nasal spray or saline irrigations (neti pot, sinus cleanse)  3. For your sore throat you may try cepacol lozenges, salt water gargles  4. Take Tylenol or Ibuprofen to help with pain/inflammation  5. Stay hydrated, drink plenty of fluids  6. I have sent in an antibiotic for you to pick up in 5 days if your symptoms do not improve.   7. We will contact you if your inflammation marker drawn today comes back high. Please go to ED if you experience sudden blackening of vision.

## 2016-12-30 NOTE — ED Triage Notes (Signed)
Pt sts URI sx x 3 days with some chills and body aches

## 2016-12-30 NOTE — ED Provider Notes (Addendum)
[place]    CSN: [id number] Arrival date & time: 12/30/16  1214     History   Chief Complaint Chief Complaint  Patient presents with  . URI    HPI Andrea Lowery is a 76 y.o. female with history of DM Type 2 and migranes coming in with left facial pain accompanied with watery eyes, cough, nasal congestion, sore throat, left jaw pain. Symptoms have progressively worsened since Friday. She has been sweating and alternating between hot and cold. She states she has tried Tylenol, Aleeve, Mucinex, Flonase, BC Powder, Sinus irrigation, daily Zyrtec. She states she does not improve without an antibiotic. She denies fever, chest pain, abdominal pain. Endorses mild shortness of breath, from her asthma, but nothing more than normal. Denies vision changes, but does feel her left eye feels more swollen in the morning.    She presented 2 years ago with similar symptoms and had a temporal artery biopsy that came back negative. She went to Dr. Tomi Likens with [facility] neurology once.  HPI  Past Medical History:  Diagnosis Date  . Arthritis   . Asthma   . Diabetes mellitus without complication ([place])    Diabetes II  . DVT (deep venous thrombosis) ([place])    "Years ago"  . Dyslipidemia   . GERD (gastroesophageal reflux disease)   . Headache    Migraine  . Hypertension   . Hypothyroidism   . LBBB (left bundle branch block)   . MGUS (monoclonal gammopathy of unknown significance)   . MGUS (monoclonal gammopathy of unknown significance)   . Migraine   . OSA on CPAP     Patient Active Problem List   Diagnosis Date Noted  . Hypokalemia 04/30/2016  . Impingement syndrome of left shoulder 04/05/2016  . Carotid artery disease ([place]) 01/19/2016  . Dyslipidemia 01/19/2016  . History of lumbar laminectomy for spinal cord decompression 07/31/2015  . Chronic pain of right hip 05/02/2015  . Temporal arteritis ([place]) 11/07/2014  . Headache 11/07/2014  . Diabetes mellitus type 2,  controlled ([place]) 11/07/2014  . Diastolic CHF ([place]) 43/15/4008  . Sacroiliac dysfunction 11/04/2014  . Trochanteric bursitis of right hip 11/04/2014  . Asthma 10/05/2014  . GERD (gastroesophageal reflux disease) 10/05/2014  . Allergic rhinitis 10/05/2014  . Multinodular goiter 08/09/2014  . Disorder of sacroiliac joint 07/29/2014  . 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 Hypersomnolence 04/16/2014  . Snoring 04/16/2014  . Preventive measure 01/25/2014  . Acute diastolic CHF (congestive heart failure), NYHA class 2 ([place]) 07/29/2013  . Bilateral leg edema 07/20/2013  . 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 Essential hypertension 07/01/2012  . LBBB (left bundle branch block) 07/01/2012  . DOE (dyspnea on exertion) 07/01/2012  . OSA on CPAP 07/01/2012    Past Surgical History:  Procedure Laterality Date  . ABDOMINAL HYSTERECTOMY  1969  . ARTERY BIOPSY N/A 11/07/2014   Procedure: BIOPSY TEMPORAL ARTERY;  Surgeon: Leta Baptist, MD;  Location: [facility];  Service: ENT;  Laterality: N/A;  . CARPAL TUNNEL RELEASE Right   . CHOLECYSTECTOMY  1976  . LUMBAR LAMINECTOMY/DECOMPRESSION MICRODISCECTOMY N/A 07/31/2015   Procedure: L4-5 Decompression, Possible Right L4-5 Microdiscectomy;  Surgeon: Marybelle Killings, MD;  Location: [facility];  Service: Orthopedics;  Laterality: N/A;  . NM MYOVIEW LTD  02/13/2010   No ischemia  . PLANTAR FASCIA RELEASE    . TONSILLECTOMY  1965  . US ECHOCARDIOGRAPHY  09/20/2008   borderline LVH,mild TR,AOV mildly  sclerotic w/ca+ of the leaflets    OB History    No data available       Home Medications    Prior to Admission medications   Medication Sig Start Date End Date Taking? Authorizing Provider  albuterol (PROVENTIL HFA;VENTOLIN HFA) 108 (90 BASE) MCG/ACT inhaler Inhale 2 puffs into the lungs every 4 (four) hours as needed for wheezing or shortness of breath.     [provider]  alendronate  (FOSAMAX) 70 MG tablet Take 70 mg by mouth every Wednesday.  05/30/15   [provider]  beclomethasone (QVAR) 80 MCG/ACT inhaler Inhale 2 puffs into the lungs 2 (two) times daily.    [provider]  Calcium Carb-Cholecalciferol (CALCIUM 600 + D PO) Take 600 mg by mouth daily.     [provider]  cetirizine (ZYRTEC) 10 MG tablet Take 10 mg by mouth daily.     [provider]  cycloSPORINE (RESTASIS) 0.05 % ophthalmic emulsion Place 1 drop into both eyes at bedtime.     [provider]  EPINEPHrine 0.3 mg/0.3 mL IJ SOAJ injection Use as directed for severe allergic reaction 08/28/15   Kozlow, Donnamarie Poag, MD  esomeprazole (NEXIUM) 40 MG capsule Take 40 mg by mouth 2 (two) times daily.     [provider]  ezetimibe (ZETIA) 10 MG tablet Take 1 tablet (10 mg total) by mouth daily. Patient taking differently: Take 10 mg by mouth every evening.  02/26/16 07/09/16  Hilty, Nadean Corwin, MD  fluticasone (FLONASE) 50 MCG/ACT nasal spray Place 2 sprays into both nostrils as needed for allergies.  12/27/13   [provider]  levothyroxine (SYNTHROID, LEVOTHROID) 50 MCG tablet take 1 tablet by mouth once daily Patient taking differently: take 1 tablet (50 mcg) by mouth once daily 08/04/15   Renato Shin, MD  metoprolol succinate (TOPROL-XL) 25 MG 24 hr tablet Take 25 mg by mouth daily.    [provider]  mometasone-formoterol (DULERA) 100-5 MCG/ACT AERO Inhale 2 puffs into the lungs as needed for wheezing or shortness of breath.     [provider]  montelukast (SINGULAIR) 10 MG tablet Take 10 mg by mouth at bedtime.    [provider]  potassium chloride SA (K-DUR,KLOR-CON) 20 MEQ tablet Take 20 mEq by mouth daily.  10/21/14   [provider]  predniSONE (DELTASONE) 20 MG tablet Take 2 tablets (40 mg total) by mouth daily. 07/10/16   Davonna Belling, MD  rosuvastatin (CRESTOR) 40 MG tablet Take 1 tablet (40 mg total) by  mouth daily. Need appointment before anymore refills Patient taking differently: Take 40 mg by mouth every evening. Need appointment before anymore refills 02/26/16   Pixie Casino, MD  triamcinolone cream (KENALOG) 0.1 % Apply 1 application topically 2 (two) times daily. 08/31/16   Barnet Glasgow, NP  valsartan-hydrochlorothiazide (DIOVAN-HCT) 320-12.5 MG tablet take 1 tablet by mouth once daily 07/15/16   Hilty, Nadean Corwin, MD    Family History Family History  Problem Relation Age of Onset  . Diabetes Father   . Heart failure Father   . Hypertension Father   . Thyroid disease Sister   . Hypertension Sister     Social History Social History   Tobacco Use  . Smoking status: Former Smoker    Last attempt to quit: 02/04/2006    Years since quitting: 10.9  . Smokeless tobacco: Never Used  Substance Use Topics  . Alcohol use: No    Alcohol/week: 0.0 oz  .  Drug use: No     Allergies   Bee venom and Penicillins   Review of Systems Review of Systems  Constitutional: Positive for diaphoresis. Negative for fever.  HENT: Positive for congestion, rhinorrhea and sore throat. Negative for ear pain.   Eyes: Negative for photophobia and visual disturbance.  Respiratory: Positive for cough and shortness of breath. Negative for chest tightness and wheezing.   Cardiovascular: Negative for chest pain.  Gastrointestinal: Negative for abdominal pain, nausea and vomiting.  Skin: Negative for rash.  Neurological: Positive for headaches. Negative for dizziness, speech difficulty and light-headedness.     Physical Exam Triage Vital Signs ED Triage Vitals [12/30/16 1236]  Enc Vitals Group     BP (!) 178/86     Pulse Rate 80     Resp 18     Temp 98.2 F (36.8 C)     Temp Source Oral     SpO2 95 %     Weight      Height      Head Circumference      Peak Flow      Pain Score      Pain Loc      Pain Edu?      Excl. in [place]?    No data found.  Updated Vital Signs BP (!) 178/86  (BP Location: Left Arm)   Pulse 80   Temp 98.2 F (36.8 C) (Oral)   Resp 18   SpO2 95%   Visual Acuity Right Eye Distance: 20/50(pt had glasses on and test completed by Harle Battiest) Left Eye Distance: 20/40(pt had glasses on and test completed by Harle Battiest) Bilateral Distance: 20/40 (pt had glasses on and test completed by Harle Battiest)  Right Eye Near:   Left Eye Near:    Bilateral Near:     Physical Exam  Constitutional: She appears well-developed and well-nourished.  No acute distress  HENT:  Head: Normocephalic and atraumatic. Head is without right periorbital erythema and without left periorbital erythema.  Right Ear: Tympanic membrane and ear canal normal. Tympanic membrane is not erythematous.  Left Ear: Tympanic membrane and ear canal normal. Tympanic membrane is not erythematous.  Nose: Rhinorrhea present. Left sinus exhibits maxillary sinus tenderness and frontal sinus tenderness.  Mouth/Throat: Oropharynx is clear and moist. No posterior oropharyngeal erythema.  Left temporal area tender to palpation, no nodularity or erythema  Erythematous turbinates.  Eyes: Conjunctivae are normal. Pupils are equal, round, and reactive to light. Right eye exhibits no discharge. Left eye exhibits no discharge. Right conjunctiva is not injected. Left conjunctiva is not injected.  Neck: Normal range of motion. Muscular tenderness present.  No lymphadenopathy  Cardiovascular: Normal rate and regular rhythm.  No murmur heard. Pulmonary/Chest: Effort normal and breath sounds normal. No accessory muscle usage. No respiratory distress. She has no decreased breath sounds.  Abdominal: Soft. There is no tenderness.     UC Treatments / Results  Labs (all labs ordered are listed, but only abnormal results are displayed) Labs Reviewed - No data to display  EKG  EKG Interpretation None       Radiology No results found.  Procedures Procedures (including critical care time)  Medications  Ordered in UC Medications - No data to display   Initial Impression / Assessment and Plan / UC Course  I have reviewed the triage vital signs and the nursing notes.  Pertinent labs & imaging results that were available during my care of the patient were reviewed by me and considered in  my medical decision making (see chart for details).    Discussed patient with Dr. Tomi [facility] at [facility]. Advised no immediate need to work up for giant cell arteritis right now. Advised to obtain ESR.  Patient advised to continue to try symptom management for a few days. Sent a prescription for azithromycin for her to fill at the end of the week if she is not improving. Advised to go to ED if she experiences any blackening of vision.  Discharge Instructions: You likely having a viral upper respiratory infection. We recommended symptom control. I expect your symptoms to start improving in the next 1-2 weeks.   1. Take a daily allergy pill like Zyrtec, Claritin, or Store brand (anti-histamine) consistently for 2 weeks  2. For congestion you may try an oral decongestant like Mucinex or sudafed. You may also try flonase nasal spray or saline irrigations (neti pot, sinus cleanse)  3. For your sore throat you may try cepacol lozenges, salt water gargles  4. Take Tylenol or Ibuprofen to help with pain/inflammation  5. Stay hydrated, drink plenty of fluids    Final Clinical Impressions(s) / UC Diagnoses   Final diagnoses:  None    ED Discharge Orders    None       Controlled Substance Prescriptions [place] Controlled Substance Registry consulted? Not Applicable   Janith Lima, PA-C 12/30/16 1424    Wieters, [place] C, PA-C 12/30/16 1425

## 2017-01-23 ENCOUNTER — Ambulatory Visit (INDEPENDENT_AMBULATORY_CARE_PROVIDER_SITE_OTHER): Payer: Medicare HMO | Admitting: *Deleted

## 2017-01-23 DIAGNOSIS — J309 Allergic rhinitis, unspecified: Secondary | ICD-10-CM | POA: Diagnosis not present

## 2017-02-05 ENCOUNTER — Ambulatory Visit (INDEPENDENT_AMBULATORY_CARE_PROVIDER_SITE_OTHER): Payer: Medicare HMO | Admitting: *Deleted

## 2017-02-05 DIAGNOSIS — J309 Allergic rhinitis, unspecified: Secondary | ICD-10-CM | POA: Diagnosis not present

## 2017-02-07 ENCOUNTER — Ambulatory Visit (INDEPENDENT_AMBULATORY_CARE_PROVIDER_SITE_OTHER): Payer: Medicare HMO

## 2017-02-07 DIAGNOSIS — J309 Allergic rhinitis, unspecified: Secondary | ICD-10-CM | POA: Diagnosis not present

## 2017-02-10 ENCOUNTER — Ambulatory Visit (INDEPENDENT_AMBULATORY_CARE_PROVIDER_SITE_OTHER): Payer: Medicare HMO | Admitting: *Deleted

## 2017-02-10 DIAGNOSIS — T63441D Toxic effect of venom of bees, accidental (unintentional), subsequent encounter: Secondary | ICD-10-CM | POA: Diagnosis not present

## 2017-02-12 ENCOUNTER — Ambulatory Visit (INDEPENDENT_AMBULATORY_CARE_PROVIDER_SITE_OTHER): Payer: Medicare HMO | Admitting: *Deleted

## 2017-02-12 DIAGNOSIS — J309 Allergic rhinitis, unspecified: Secondary | ICD-10-CM

## 2017-02-15 ENCOUNTER — Other Ambulatory Visit: Payer: Self-pay | Admitting: Internal Medicine

## 2017-02-15 DIAGNOSIS — E785 Hyperlipidemia, unspecified: Secondary | ICD-10-CM

## 2017-02-17 NOTE — Telephone Encounter (Signed)
Rx(s) sent to pharmacy electronically.  

## 2017-02-18 ENCOUNTER — Ambulatory Visit (HOSPITAL_COMMUNITY)
Admission: RE | Admit: 2017-02-18 | Payer: Medicare HMO | Source: Ambulatory Visit | Attending: Internal Medicine | Admitting: Internal Medicine

## 2017-02-19 ENCOUNTER — Ambulatory Visit (INDEPENDENT_AMBULATORY_CARE_PROVIDER_SITE_OTHER): Payer: Medicare HMO

## 2017-02-19 DIAGNOSIS — J309 Allergic rhinitis, unspecified: Secondary | ICD-10-CM

## 2017-02-25 NOTE — Progress Notes (Signed)
VIALS EXP 01-06-19 

## 2017-02-26 DIAGNOSIS — J3089 Other allergic rhinitis: Secondary | ICD-10-CM | POA: Diagnosis not present

## 2017-02-27 DIAGNOSIS — J3089 Other allergic rhinitis: Secondary | ICD-10-CM

## 2017-03-19 ENCOUNTER — Ambulatory Visit (INDEPENDENT_AMBULATORY_CARE_PROVIDER_SITE_OTHER): Payer: Medicare HMO | Admitting: *Deleted

## 2017-03-19 ENCOUNTER — Ambulatory Visit: Payer: Self-pay

## 2017-03-19 DIAGNOSIS — J309 Allergic rhinitis, unspecified: Secondary | ICD-10-CM | POA: Diagnosis not present

## 2017-04-10 ENCOUNTER — Ambulatory Visit (INDEPENDENT_AMBULATORY_CARE_PROVIDER_SITE_OTHER): Payer: Medicare HMO | Admitting: *Deleted

## 2017-04-10 DIAGNOSIS — J309 Allergic rhinitis, unspecified: Secondary | ICD-10-CM | POA: Diagnosis not present

## 2017-04-18 ENCOUNTER — Ambulatory Visit (INDEPENDENT_AMBULATORY_CARE_PROVIDER_SITE_OTHER): Payer: Medicare HMO

## 2017-04-18 DIAGNOSIS — J309 Allergic rhinitis, unspecified: Secondary | ICD-10-CM

## 2017-04-22 ENCOUNTER — Ambulatory Visit: Payer: Self-pay

## 2017-04-24 ENCOUNTER — Ambulatory Visit (INDEPENDENT_AMBULATORY_CARE_PROVIDER_SITE_OTHER): Payer: Medicare HMO

## 2017-04-24 ENCOUNTER — Inpatient Hospital Stay: Payer: Medicare HMO | Attending: Hematology and Oncology

## 2017-04-24 DIAGNOSIS — I10 Essential (primary) hypertension: Secondary | ICD-10-CM | POA: Diagnosis not present

## 2017-04-24 DIAGNOSIS — T63441D Toxic effect of venom of bees, accidental (unintentional), subsequent encounter: Secondary | ICD-10-CM

## 2017-04-24 DIAGNOSIS — D472 Monoclonal gammopathy: Secondary | ICD-10-CM | POA: Insufficient documentation

## 2017-04-24 LAB — COMPREHENSIVE METABOLIC PANEL
ALT: 21 U/L (ref 0–55)
AST: 19 U/L (ref 5–34)
Albumin: 3.7 g/dL (ref 3.5–5.0)
Alkaline Phosphatase: 65 U/L (ref 40–150)
Anion gap: 7 (ref 3–11)
BUN: 9 mg/dL (ref 7–26)
CO2: 26 mmol/L (ref 22–29)
Calcium: 9.6 mg/dL (ref 8.4–10.4)
Chloride: 107 mmol/L (ref 98–109)
Creatinine, Ser: 0.98 mg/dL (ref 0.60–1.10)
GFR calc Af Amer: >60 mL/min (ref >60)
GFR calc non Af Amer: 55 mL/min — ABNORMAL LOW (ref >60)
Glucose, Bld: 141 mg/dL — ABNORMAL HIGH (ref 70–140)
Potassium: 4 mmol/L (ref 3.5–5.1)
Sodium: 140 mmol/L (ref 136–145)
Total Bilirubin: 0.4 mg/dL (ref 0.2–1.2)
Total Protein: 7.6 g/dL (ref 6.4–8.3)

## 2017-04-24 LAB — CBC WITH DIFFERENTIAL/PLATELET
Basophils Absolute: 0 10*3/uL (ref 0.0–0.1)
Basophils Relative: 1 %
Eosinophils Absolute: 0.3 10*3/uL (ref 0.0–0.5)
Eosinophils Relative: 6 %
HCT: 38 % (ref 34.8–46.6)
Hemoglobin: 12.7 g/dL (ref 11.6–15.9)
Lymphocytes Relative: 37 %
Lymphs Abs: 1.9 10*3/uL (ref 0.9–3.3)
MCH: 27 pg (ref 25.1–34.0)
MCHC: 33.4 g/dL (ref 31.5–36.0)
MCV: 80.7 fL (ref 79.5–101.0)
Monocytes Absolute: 0.5 10*3/uL (ref 0.1–0.9)
Monocytes Relative: 10 %
Neutro Abs: 2.3 10*3/uL (ref 1.5–6.5)
Neutrophils Relative %: 46 %
Platelets: 169 10*3/uL (ref 145–400)
RBC: 4.71 MIL/uL (ref 3.70–5.45)
RDW: 15.2 % — ABNORMAL HIGH (ref 11.2–14.5)
WBC: 5 10*3/uL (ref 3.9–10.3)

## 2017-04-24 NOTE — Progress Notes (Signed)
Per venom injection protocol she was advised that she would be given 0.5 cc of each venom, wait 20 minutes, then given the remaining 1.0 cc. She was okay with this plan and waited for an additional 30 minutes after her last injection.

## 2017-04-25 LAB — KAPPA/LAMBDA LIGHT CHAINS
Kappa free light chain: 102.4 mg/L — ABNORMAL HIGH (ref 3.3–19.4)
Kappa, lambda light chain ratio: 8.83 — ABNORMAL HIGH (ref 0.26–1.65)
Lambda free light chains: 11.6 mg/L (ref 5.7–26.3)

## 2017-04-28 LAB — MULTIPLE MYELOMA PANEL, SERUM
Albumin SerPl Elph-Mcnc: 3.5 g/dL (ref 2.9–4.4)
Albumin/Glob SerPl: 1 (ref 0.7–1.7)
Alpha 1: 0.2 g/dL (ref 0.0–0.4)
Alpha2 Glob SerPl Elph-Mcnc: 0.7 g/dL (ref 0.4–1.0)
B-Globulin SerPl Elph-Mcnc: 1 g/dL (ref 0.7–1.3)
Gamma Glob SerPl Elph-Mcnc: 1.6 g/dL (ref 0.4–1.8)
Globulin, Total: 3.6 g/dL (ref 2.2–3.9)
IgA: 93 mg/dL (ref 64–422)
IgG (Immunoglobin G), Serum: 1883 mg/dL — ABNORMAL HIGH (ref 700–1600)
IgM (Immunoglobulin M), Srm: 43 mg/dL (ref 26–217)
M Protein SerPl Elph-Mcnc: 1.3 g/dL — ABNORMAL HIGH
Total Protein ELP: 7.1 g/dL (ref 6.0–8.5)

## 2017-05-01 ENCOUNTER — Encounter: Payer: Self-pay | Admitting: Hematology and Oncology

## 2017-05-01 ENCOUNTER — Ambulatory Visit (INDEPENDENT_AMBULATORY_CARE_PROVIDER_SITE_OTHER): Payer: Medicare HMO | Admitting: *Deleted

## 2017-05-01 ENCOUNTER — Inpatient Hospital Stay (HOSPITAL_BASED_OUTPATIENT_CLINIC_OR_DEPARTMENT_OTHER): Payer: Medicare HMO | Admitting: Hematology and Oncology

## 2017-05-01 ENCOUNTER — Telehealth: Payer: Self-pay | Admitting: Hematology and Oncology

## 2017-05-01 DIAGNOSIS — I10 Essential (primary) hypertension: Secondary | ICD-10-CM | POA: Diagnosis not present

## 2017-05-01 DIAGNOSIS — D472 Monoclonal gammopathy: Secondary | ICD-10-CM | POA: Diagnosis not present

## 2017-05-01 DIAGNOSIS — J309 Allergic rhinitis, unspecified: Secondary | ICD-10-CM | POA: Diagnosis not present

## 2017-05-01 NOTE — Assessment & Plan Note (Signed)
Clinically, she has no signs of disease progression. Her M spike and IgG kappa levels are stable She has no signs of symptoms of end organ damage I will see her back in 12 months with repeat blood work to be done the week ahead of time so that we can have results before see her back.  

## 2017-05-01 NOTE — Assessment & Plan Note (Signed)
Her blood pressure is intermittently high I suspect it could be an element of whitecoat hypertension Observe only

## 2017-05-01 NOTE — Telephone Encounter (Signed)
Gave patient AVs and calendar of upcoming march 2020 appointments.  °

## 2017-05-01 NOTE — Progress Notes (Signed)
[facility] OFFICE PROGRESS NOTE  Patient Care Team: Hayden Rasmussen, MD as PCP - General (Family Medicine)  ASSESSMENT & PLAN:  MGUS (monoclonal gammopathy of unknown significance) Clinically, she has no signs of disease progression. Her M spike and IgG kappa levels are stable She has no signs of symptoms of end organ damage I will see her back in 12 months with repeat blood work to be done the week ahead of time so that we can have results before see her back.   Essential hypertension Her blood pressure is intermittently high I suspect it could be an element of whitecoat hypertension Observe only   No orders of the defined types were placed in this encounter.   INTERVAL HISTORY: Please see below for problem oriented charting. She returns for further follow-up She denies new bone pain Denies recent infection She denies recent headache or blurriness of vision or significant high blood pressure Her appetite is stable, no recent weight loss.  SUMMARY OF ONCOLOGIC HISTORY:  I reviewed the patient's records extensive and collaborated the history with the patient. Summary of her history is as follows: She has a long-standing history of multiple arthralgias, left CMC pain, OA of the bilateral knees, hands and feet and osteoporosis for which she is on Prolia previously. She had SPEP drawn on 02/19/2013 that revealed a restricted band with monoclonal protein present, IgG kappa subtype. The monoclonal protein peak accounted for 1.38 g/dL of the total 1.66 g/dL of protein in the gamma region. Her CMP demonstrated a creatinine of 0.81 and calcium of 9.0 and total protein of 7.2 and albumin of 3.9; Her CBC was within normal limits with a hemoglobin of 12.5. Skeletal x-ray show no evidence of lytic lesions. She had a bone marrow biopsy on 06/22/2013 which show 10% of plasma cells. She was observed.  REVIEW OF SYSTEMS:   Constitutional: Denies fevers, chills or abnormal weight  loss Eyes: Denies blurriness of vision Ears, nose, mouth, throat, and face: Denies mucositis or sore throat Respiratory: Denies cough, dyspnea or wheezes Cardiovascular: Denies palpitation, chest discomfort or lower extremity swelling Gastrointestinal:  Denies nausea, heartburn or change in bowel habits Skin: Denies abnormal skin rashes Lymphatics: Denies new lymphadenopathy or easy bruising Neurological:Denies numbness, tingling or new weaknesses Behavioral/Psych: Mood is stable, no new changes  All other systems were reviewed with the patient and are negative.  I have reviewed the past medical history, past surgical history, social history and family history with the patient and they are unchanged from previous note.  ALLERGIES:  is allergic to bee venom and penicillins.  MEDICATIONS:  Current Outpatient Medications  Medication Sig Dispense Refill  . albuterol (PROVENTIL HFA;VENTOLIN HFA) 108 (90 BASE) MCG/ACT inhaler Inhale 2 puffs into the lungs every 4 (four) hours as needed for wheezing or shortness of breath.     Marland Kitchen alendronate (FOSAMAX) 70 MG tablet Take 70 mg by mouth every Wednesday.     . beclomethasone (QVAR) 80 MCG/ACT inhaler Inhale 2 puffs into the lungs 2 (two) times daily.    . Calcium Carb-Cholecalciferol (CALCIUM 600 + D PO) Take 600 mg by mouth daily.     . cetirizine (ZYRTEC) 10 MG tablet Take 10 mg by mouth daily.     . cycloSPORINE (RESTASIS) 0.05 % ophthalmic emulsion Place 1 drop into both eyes at bedtime.     Marland Kitchen EPINEPHrine 0.3 mg/0.3 mL IJ SOAJ injection Use as directed for severe allergic reaction 2 Device 1  . esomeprazole (NEXIUM)  40 MG capsule Take 40 mg by mouth 2 (two) times daily.     Marland Kitchen ezetimibe (ZETIA) 10 MG tablet take 1 tablet by mouth once daily 90 tablet 0  . fluticasone (FLONASE) 50 MCG/ACT nasal spray Place 2 sprays into both nostrils as needed for allergies.     Marland Kitchen levothyroxine (SYNTHROID, LEVOTHROID) 50 MCG tablet take 1 tablet by mouth once  daily (Patient taking differently: take 1 tablet (50 mcg) by mouth once daily) 90 tablet 0  . metoprolol succinate (TOPROL-XL) 25 MG 24 hr tablet Take 25 mg by mouth daily.    . mometasone-formoterol (DULERA) 100-5 MCG/ACT AERO Inhale 2 puffs into the lungs as needed for wheezing or shortness of breath.     . montelukast (SINGULAIR) 10 MG tablet Take 10 mg by mouth at bedtime.    . rosuvastatin (CRESTOR) 40 MG tablet take 1 tablet by mouth once daily 90 tablet 0  . triamcinolone cream (KENALOG) 0.1 % Apply 1 application topically 2 (two) times daily. 30 g 0   No current facility-administered medications for this visit.     PHYSICAL EXAMINATION: ECOG PERFORMANCE STATUS: 0 - Asymptomatic  Vitals:   05/01/17 1036  BP: (!) 184/65  Pulse: 68  Resp: 18  Temp: 97.6 F (36.4 C)  SpO2: 100%   Filed Weights   05/01/17 1036  Weight: 199 lb 4.8 oz (90.4 kg)    GENERAL:alert, no distress and comfortable SKIN: skin color, texture, turgor are normal, no rashes or significant lesions EYES: normal, Conjunctiva are pink and non-injected, sclera clear OROPHARYNX:no exudate, no erythema and lips, buccal mucosa, and tongue normal  NECK: supple, thyroid normal size, non-tender, without nodularity LYMPH:  no palpable lymphadenopathy in the cervical, axillary or inguinal LUNGS: clear to auscultation and percussion with normal breathing effort HEART: regular rate & rhythm and no murmurs and no lower extremity edema ABDOMEN:abdomen soft, non-tender and normal bowel sounds Musculoskeletal:no cyanosis of digits and no clubbing  NEURO: alert & oriented x 3 with fluent speech, no focal motor/sensory deficits  LABORATORY DATA:  I have reviewed the data as listed    Component Value Date/Time   NA 140 04/24/2017 1046   NA 140 04/23/2016 1303   K 4.0 04/24/2017 1046   K 2.8 (LL) 04/23/2016 1303   CL 107 04/24/2017 1046   CO2 26 04/24/2017 1046   CO2 28 04/23/2016 1303   GLUCOSE 141 (H) 04/24/2017  1046   GLUCOSE 156 (H) 04/23/2016 1303   BUN 9 04/24/2017 1046   BUN 6.9 (L) 04/23/2016 1303   CREATININE 0.98 04/24/2017 1046   CREATININE 0.9 04/23/2016 1303   CALCIUM 9.6 04/24/2017 1046   CALCIUM 9.7 04/23/2016 1303   PROT 7.6 04/24/2017 1046   PROT 7.4 04/23/2016 1303   PROT 8.0 04/23/2016 1303   ALBUMIN 3.7 04/24/2017 1046   ALBUMIN 3.9 04/23/2016 1303   AST 19 04/24/2017 1046   AST 19 04/23/2016 1303   ALT 21 04/24/2017 1046   ALT 24 04/23/2016 1303   ALKPHOS 65 04/24/2017 1046   ALKPHOS 65 04/23/2016 1303   BILITOT 0.4 04/24/2017 1046   BILITOT 0.53 04/23/2016 1303   GFRNONAA 55 (L) 04/24/2017 1046   GFRAA >60 04/24/2017 1046    No results found for: SPEP, UPEP  Lab Results  Component Value Date   WBC 5.0 04/24/2017   NEUTROABS 2.3 04/24/2017   HGB 12.7 04/24/2017   HCT 38.0 04/24/2017   MCV 80.7 04/24/2017   PLT 169 04/24/2017  Chemistry      Component Value Date/Time   NA 140 04/24/2017 1046   NA 140 04/23/2016 1303   K 4.0 04/24/2017 1046   K 2.8 (LL) 04/23/2016 1303   CL 107 04/24/2017 1046   CO2 26 04/24/2017 1046   CO2 28 04/23/2016 1303   BUN 9 04/24/2017 1046   BUN 6.9 (L) 04/23/2016 1303   CREATININE 0.98 04/24/2017 1046   CREATININE 0.9 04/23/2016 1303      Component Value Date/Time   CALCIUM 9.6 04/24/2017 1046   CALCIUM 9.7 04/23/2016 1303   ALKPHOS 65 04/24/2017 1046   ALKPHOS 65 04/23/2016 1303   AST 19 04/24/2017 1046   AST 19 04/23/2016 1303   ALT 21 04/24/2017 1046   ALT 24 04/23/2016 1303   BILITOT 0.4 04/24/2017 1046   BILITOT 0.53 04/23/2016 1303       All questions were answered. The patient knows to call the clinic with any problems, questions or concerns. No barriers to learning was detected.  I spent 10 minutes counseling the patient face to face. The total time spent in the appointment was 15 minutes and more than 50% was on counseling and review of test results  Heath Lark, MD 05/01/2017 11:58 AM

## 2017-05-07 ENCOUNTER — Ambulatory Visit (INDEPENDENT_AMBULATORY_CARE_PROVIDER_SITE_OTHER): Payer: Medicare HMO | Admitting: *Deleted

## 2017-05-07 DIAGNOSIS — J309 Allergic rhinitis, unspecified: Secondary | ICD-10-CM | POA: Diagnosis not present

## 2017-05-14 ENCOUNTER — Ambulatory Visit (INDEPENDENT_AMBULATORY_CARE_PROVIDER_SITE_OTHER): Payer: Medicare HMO | Admitting: *Deleted

## 2017-05-14 DIAGNOSIS — J309 Allergic rhinitis, unspecified: Secondary | ICD-10-CM

## 2017-05-20 ENCOUNTER — Encounter: Payer: Self-pay | Admitting: Allergy and Immunology

## 2017-05-20 ENCOUNTER — Ambulatory Visit (INDEPENDENT_AMBULATORY_CARE_PROVIDER_SITE_OTHER): Payer: Medicare HMO | Admitting: Allergy and Immunology

## 2017-05-20 VITALS — BP 180/90 | HR 60 | Resp 16

## 2017-05-20 DIAGNOSIS — K219 Gastro-esophageal reflux disease without esophagitis: Secondary | ICD-10-CM

## 2017-05-20 DIAGNOSIS — D721 Eosinophilia, unspecified: Secondary | ICD-10-CM

## 2017-05-20 DIAGNOSIS — Z91030 Bee allergy status: Secondary | ICD-10-CM

## 2017-05-20 DIAGNOSIS — J3089 Other allergic rhinitis: Secondary | ICD-10-CM

## 2017-05-20 DIAGNOSIS — J4551 Severe persistent asthma with (acute) exacerbation: Secondary | ICD-10-CM

## 2017-05-20 DIAGNOSIS — Z91038 Other insect allergy status: Secondary | ICD-10-CM

## 2017-05-20 MED ORDER — METHYLPREDNISOLONE ACETATE 80 MG/ML IJ SUSP
80.0000 mg | Freq: Once | INTRAMUSCULAR | Status: AC
  Administered 2017-05-20: 80 mg via INTRAMUSCULAR

## 2017-05-20 MED ORDER — IPRATROPIUM-ALBUTEROL 0.5-2.5 (3) MG/3ML IN SOLN: 3.0000 mL | RESPIRATORY_TRACT | 1 refills | Status: DC | PRN

## 2017-05-20 NOTE — Progress Notes (Signed)
Follow-up Note  Referring Provider: Darden Amber, PA Primary Provider: Darden Amber, PA Date of Office Visit: 05/20/2017  Subjective:   Andrea Lowery (DOB: 1940-04-30) is a 77 y.o. female who returns to the [facility] on 05/20/2017 in re-evaluation of the following:  HPI: Andrea Lowery returns to this clinic in reevaluation of asthma, allergic rhinitis, chronic sinusitis, reflux induced respiratory disease, and hymenoptera allergy.  Her last visit to this clinic was 02 April 2016.  While consistently using a large collection of medical therapy directed against inflammation she has developed very significant coughing and wheezing and a requirement for a bronchodilator on a daily basis including nebulized albuterol over the course of the past 2 months or so.  She has also had some issues with nasal congestion and sneezing and itchy eyes during this timeframe.  She informs me that she has not required a systemic steroid in 2019 to treat any of her atopic disease.  Her reflux is under good control at this point in time.  She has been receiving immunotherapy for aeroallergens every 6 weeks and has been receiving immunotherapy for hymenoptera venom allergy every 8 weeks.  She did obtain a flu vaccine this year.  Allergies as of 05/20/2017      Reactions   Bee Venom Anaphylaxis   Yellow jackets   Penicillins Hives   Has patient had a PCN reaction causing immediate rash, facial/tongue/throat swelling, SOB or lightheadedness with hypotension: Yes Has patient had a PCN reaction causing severe rash involving mucus membranes or skin necrosis: No Has patient had a PCN reaction that required hospitalization No Has patient had a PCN reaction occurring within the last 10 years: No If all of the above answers are "NO", then may proceed with Cephalosporin use.      Medication List      albuterol 108 (90 Base) MCG/ACT inhaler Commonly known as:  PROVENTIL HFA;VENTOLIN  HFA Inhale 2 puffs into the lungs every 4 (four) hours as needed for wheezing or shortness of breath.   beclomethasone 80 MCG/ACT inhaler Commonly known as:  QVAR Inhale 2 puffs into the lungs 2 (two) times daily.   CALCIUM 600 + D PO Take 600 mg by mouth daily.   cetirizine 10 MG tablet Commonly known as:  ZYRTEC Take 10 mg by mouth daily.   cycloSPORINE 0.05 % ophthalmic emulsion Commonly known as:  RESTASIS Place 1 drop into both eyes at bedtime.   EPINEPHrine 0.3 mg/0.3 mL Soaj injection Commonly known as:  EPI-PEN Use as directed for severe allergic reaction   esomeprazole 40 MG capsule Commonly known as:  NEXIUM Take 40 mg by mouth 2 (two) times daily.   ezetimibe 10 MG tablet Commonly known as:  ZETIA take 1 tablet by mouth once daily   fluticasone 50 MCG/ACT nasal spray Commonly known as:  FLONASE Place 2 sprays into both nostrils as needed for allergies.   FOSAMAX 70 MG tablet Generic drug:  alendronate Take 70 mg by mouth every Wednesday.   levothyroxine 50 MCG tablet Commonly known as:  SYNTHROID, LEVOTHROID take 1 tablet by mouth once daily   metoprolol succinate 25 MG 24 hr tablet Commonly known as:  TOPROL-XL Take 25 mg by mouth daily.   montelukast 10 MG tablet Commonly known as:  SINGULAIR Take 10 mg by mouth at bedtime.   rosuvastatin 40 MG tablet Commonly known as:  CRESTOR take 1 tablet by mouth once daily  Past Medical History:  Diagnosis Date  . Arthritis   . Asthma   . Diabetes mellitus without complication ([place])    Diabetes II  . DVT (deep venous thrombosis) ([place])    "Years ago"  . Dyslipidemia   . GERD (gastroesophageal reflux disease)   . Headache    Migraine  . Hypertension   . Hypothyroidism   . LBBB (left bundle branch block)   . MGUS (monoclonal gammopathy of unknown significance)   . MGUS (monoclonal gammopathy of unknown significance)   . Migraine   . OSA on CPAP     Past Surgical History:  Procedure  Laterality Date  . ABDOMINAL HYSTERECTOMY  1969  . ARTERY BIOPSY N/A 11/07/2014   Procedure: BIOPSY TEMPORAL ARTERY;  Surgeon: Leta Baptist, MD;  Location: [facility];  Service: ENT;  Laterality: N/A;  . CARPAL TUNNEL RELEASE Right   . CHOLECYSTECTOMY  1976  . LUMBAR LAMINECTOMY/DECOMPRESSION MICRODISCECTOMY N/A 07/31/2015   Procedure: L4-5 Decompression, Possible Right L4-5 Microdiscectomy;  Surgeon: Marybelle Killings, MD;  Location: [facility];  Service: Orthopedics;  Laterality: N/A;  . NM MYOVIEW LTD  02/13/2010   No ischemia  . PLANTAR FASCIA RELEASE    . TONSILLECTOMY  1965  . US ECHOCARDIOGRAPHY  09/20/2008   borderline LVH,mild TR,AOV mildly sclerotic w/ca+ of the leaflets    Review of systems negative except as noted in HPI / PMHx or noted below:  Review of Systems  Constitutional: Negative.   HENT: Negative.   Eyes: Negative.   Respiratory: Negative.   Cardiovascular: Negative.   Gastrointestinal: Negative.   Genitourinary: Negative.   Musculoskeletal: Negative.   Skin: Negative.   Neurological: Negative.   Endo/Heme/Allergies: Negative.   Psychiatric/Behavioral: Negative.      Objective:   Vitals:   05/20/17 1129  BP: (!) 180/90  Pulse: 60  Resp: 16  SpO2: 97%          Physical Exam  HENT:  Head: Normocephalic.  Right Ear: Tympanic membrane, external ear and ear canal normal.  Left Ear: Tympanic membrane, external ear and ear canal normal.  Nose: Nose normal. No mucosal edema or rhinorrhea.  Mouth/Throat: Uvula is midline, oropharynx is clear and moist and mucous membranes are normal. No oropharyngeal exudate.  Eyes: Conjunctivae are normal.  Neck: Trachea normal. No tracheal tenderness present. No tracheal deviation present. No thyromegaly present.  Cardiovascular: Normal rate, regular rhythm, S1 normal, S2 normal and normal heart sounds.  No murmur heard. Pulmonary/Chest: No stridor. No respiratory distress. She has wheezes (Bilateral expiratory wheezes all lung fields).  She has no rales.  Musculoskeletal: She exhibits no edema.  Lymphadenopathy:       Head (right side): No tonsillar adenopathy present.       Head (left side): No tonsillar adenopathy present.    She has no cervical adenopathy.  Neurological: She is alert.  Skin: No rash noted. She is not diaphoretic. No erythema. Nails show no clubbing.    Diagnostics:    Spirometry was performed and demonstrated an FEV1 of 1.14 at 63 % of predicted.  The patient had an Asthma Control Test with the following results: ACT Total Score: 10.    Results of blood tests obtained 24 April 2017 identified WBC 5.0, absolute eosinophil 300, absolute lymphocyte 1900, hemoglobin 12.7, platelet 169  Assessment and Plan:   1. Asthma, not well controlled, severe persistent, with acute exacerbation   2. Other allergic rhinitis   3. Gastroesophageal reflux disease, esophagitis presence not specified  4. Hymenoptera allergy   5. Eosinophilia     1. Continue Dulera 200 2 inhalations + Qvar 80 2 inhalations two times per day  2. Continue Fluticasone 1-2 sprays each nostril two times per day  3. Continue montelukast 10mg  one tablet one time per day  4. Continue esomeprazole 40mg  one tablet twice a day  5. Continue immunotherapy for venoms and aeroallergens   6. If Needed:    A. nasal saline multiple times per day   B. OTC mucinex DM twice a day   C. Cetirizine 10mg  one time per day  D. Proair HFA 2 inhalations or DUONEB nebulization  E. patanol - 1 drop each eye two times per day  7. Depomedrol 80mg  delivered in clinic today  8. Submit for anti-IL-5 biological agent  9. 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  She will  continue to use a large collection of anti-inflammatory medications for respiratory tract and address the issue with her reflux and continue on immunotherapy as noted above and she will contact me noting her response to the treatment plan established today.  If she does well I will see her back in the clinic in the summer 2019 or earlier if there is a problem.  Allena Katz, MD Allergy / Immunology [place]

## 2017-05-20 NOTE — Patient Instructions (Addendum)
  1. Continue Dulera 200 2 inhalations + Qvar 80 2 inhalations two times per day  2. Continue Fluticasone 1-2 sprays each nostril two times per day  3. Continue montelukast 10mg  one tablet one time per day  4. Continue esomeprazole 40mg  one tablet twice a day  5. Continue immunotherapy for venoms and aeroallergens   6. If Needed:    A. nasal saline multiple times per day   B. OTC mucinex DM twice a day   C. Cetirizine 10mg  one time per day  D. Proair HFA 2 inhalations or DUONEB nebulization every 4-6 hours  E. patanol - 1 drop each eye two times per day  7. Depomedrol 80mg  delivered in clinic today  8. Submit for anti-IL-5 biological agent  9. Return to clinic in summer 2019 or earlier if problem

## 2017-05-21 ENCOUNTER — Encounter: Payer: Self-pay | Admitting: Allergy and Immunology

## 2017-05-28 ENCOUNTER — Telehealth: Payer: Self-pay | Admitting: Allergy and Immunology

## 2017-05-28 NOTE — Telephone Encounter (Signed)
Tammy, Please initiate Nucala or Berna Bue

## 2017-05-28 NOTE — Telephone Encounter (Signed)
Pt called and said that she needs a rx sent to [facility] for her Nucala ? That you want her to start.

## 2017-05-28 NOTE — Telephone Encounter (Signed)
She received a letter in the mail from [facility] regarding her medication and wants a call back regarding it. Please call home number and if she does not answer please leave her a message.

## 2017-05-29 NOTE — Telephone Encounter (Signed)
Spoke to patient states that she was approved for [employer] and pharmacy will ship out medication

## 2017-06-02 NOTE — Telephone Encounter (Signed)
I already have her rx sent to [facility]. Will call and check status.

## 2017-06-05 ENCOUNTER — Ambulatory Visit (INDEPENDENT_AMBULATORY_CARE_PROVIDER_SITE_OTHER): Payer: Medicare HMO | Admitting: *Deleted

## 2017-06-05 DIAGNOSIS — J4550 Severe persistent asthma, uncomplicated: Secondary | ICD-10-CM | POA: Diagnosis not present

## 2017-06-05 MED ORDER — BENRALIZUMAB 30 MG/ML ~~LOC~~ SOSY
30.0000 mg | PREFILLED_SYRINGE | SUBCUTANEOUS | Status: AC
  Administered 2017-06-05 – 2017-07-31 (×3): 30 mg via SUBCUTANEOUS

## 2017-06-05 MED ORDER — EPINEPHRINE 0.3 MG/0.3ML IJ SOAJ: INTRAMUSCULAR | 1 refills | Status: DC

## 2017-06-05 MED ORDER — OLOPATADINE HCL 0.7 % OP SOLN: 1.0000 [drp] | Freq: Every day | OPHTHALMIC | 5 refills | Status: DC | PRN

## 2017-06-05 NOTE — Progress Notes (Signed)
Immunotherapy   Patient Details  Name: Andrea Lowery MRN: [id number] Date of Birth: [birth year]  06/05/2017  [name] started injections for  Fasenra 30mg . Patient will received an injection every 28 days for 3 doses then continue at every 8 weeks.  Epi-Pen:Epi-Pen Available  Consent signed and patient instructions given. No problem after 60 minutes in the office.   [name] 06/05/2017, 5:59 PM

## 2017-06-06 ENCOUNTER — Other Ambulatory Visit: Payer: Self-pay | Admitting: Allergy and Immunology

## 2017-06-10 ENCOUNTER — Ambulatory Visit (INDEPENDENT_AMBULATORY_CARE_PROVIDER_SITE_OTHER): Payer: Medicare HMO | Admitting: *Deleted

## 2017-06-10 DIAGNOSIS — T63441D Toxic effect of venom of bees, accidental (unintentional), subsequent encounter: Secondary | ICD-10-CM

## 2017-06-12 ENCOUNTER — Ambulatory Visit (INDEPENDENT_AMBULATORY_CARE_PROVIDER_SITE_OTHER): Payer: Medicare HMO

## 2017-06-12 DIAGNOSIS — J309 Allergic rhinitis, unspecified: Secondary | ICD-10-CM

## 2017-06-29 ENCOUNTER — Other Ambulatory Visit: Payer: Self-pay | Admitting: Allergy and Immunology

## 2017-07-03 ENCOUNTER — Ambulatory Visit (INDEPENDENT_AMBULATORY_CARE_PROVIDER_SITE_OTHER): Payer: Medicare HMO | Admitting: *Deleted

## 2017-07-03 DIAGNOSIS — J4550 Severe persistent asthma, uncomplicated: Secondary | ICD-10-CM

## 2017-07-08 ENCOUNTER — Ambulatory Visit (INDEPENDENT_AMBULATORY_CARE_PROVIDER_SITE_OTHER): Payer: Medicare HMO | Admitting: *Deleted

## 2017-07-08 ENCOUNTER — Other Ambulatory Visit: Payer: Self-pay | Admitting: Allergy and Immunology

## 2017-07-08 DIAGNOSIS — J309 Allergic rhinitis, unspecified: Secondary | ICD-10-CM

## 2017-07-24 ENCOUNTER — Other Ambulatory Visit: Payer: Self-pay | Admitting: Allergy and Immunology

## 2017-07-24 ENCOUNTER — Ambulatory Visit (INDEPENDENT_AMBULATORY_CARE_PROVIDER_SITE_OTHER): Payer: Medicare HMO | Admitting: *Deleted

## 2017-07-24 DIAGNOSIS — T63441D Toxic effect of venom of bees, accidental (unintentional), subsequent encounter: Secondary | ICD-10-CM

## 2017-07-31 ENCOUNTER — Ambulatory Visit (INDEPENDENT_AMBULATORY_CARE_PROVIDER_SITE_OTHER): Payer: Medicare HMO

## 2017-07-31 DIAGNOSIS — J4550 Severe persistent asthma, uncomplicated: Secondary | ICD-10-CM | POA: Diagnosis not present

## 2017-08-15 ENCOUNTER — Other Ambulatory Visit: Payer: Self-pay | Admitting: Allergy and Immunology

## 2017-08-27 ENCOUNTER — Other Ambulatory Visit: Payer: Self-pay | Admitting: Allergy and Immunology

## 2017-08-27 NOTE — Telephone Encounter (Signed)
Courtesy refill  

## 2017-08-28 ENCOUNTER — Ambulatory Visit: Payer: Self-pay

## 2017-09-10 ENCOUNTER — Ambulatory Visit (INDEPENDENT_AMBULATORY_CARE_PROVIDER_SITE_OTHER): Payer: Medicare HMO | Admitting: *Deleted

## 2017-09-10 DIAGNOSIS — J309 Allergic rhinitis, unspecified: Secondary | ICD-10-CM

## 2017-09-10 IMAGING — CR DG CHEST 2V
2 series · 2 of 2 positions shown · non-contrast
Comparison: 01/30/2014

CLINICAL DATA: Preop imaging for lumbar stenosis.  Hypertension.

EXAM:
CHEST  2 VIEW

[w chest pa]
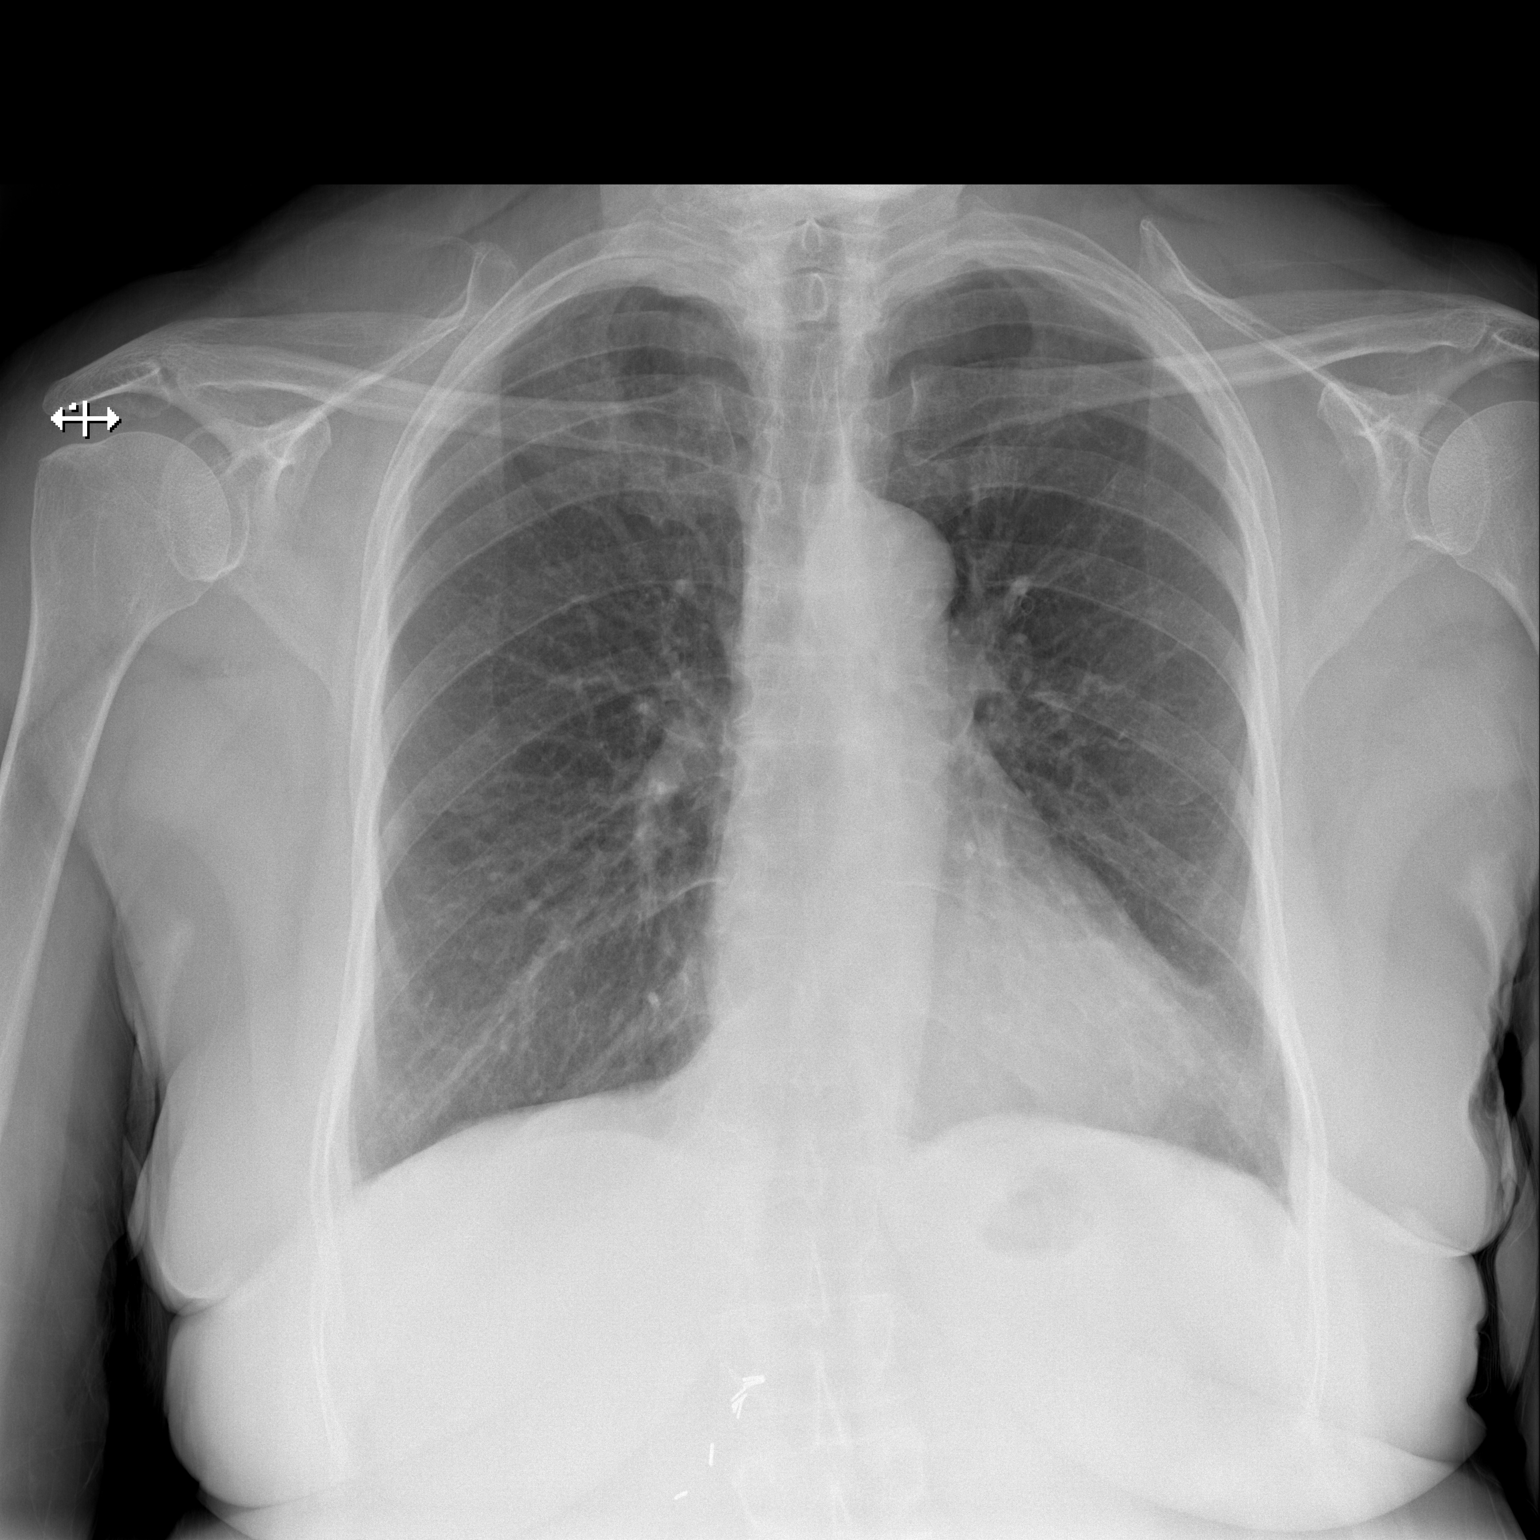

[w chest lat]
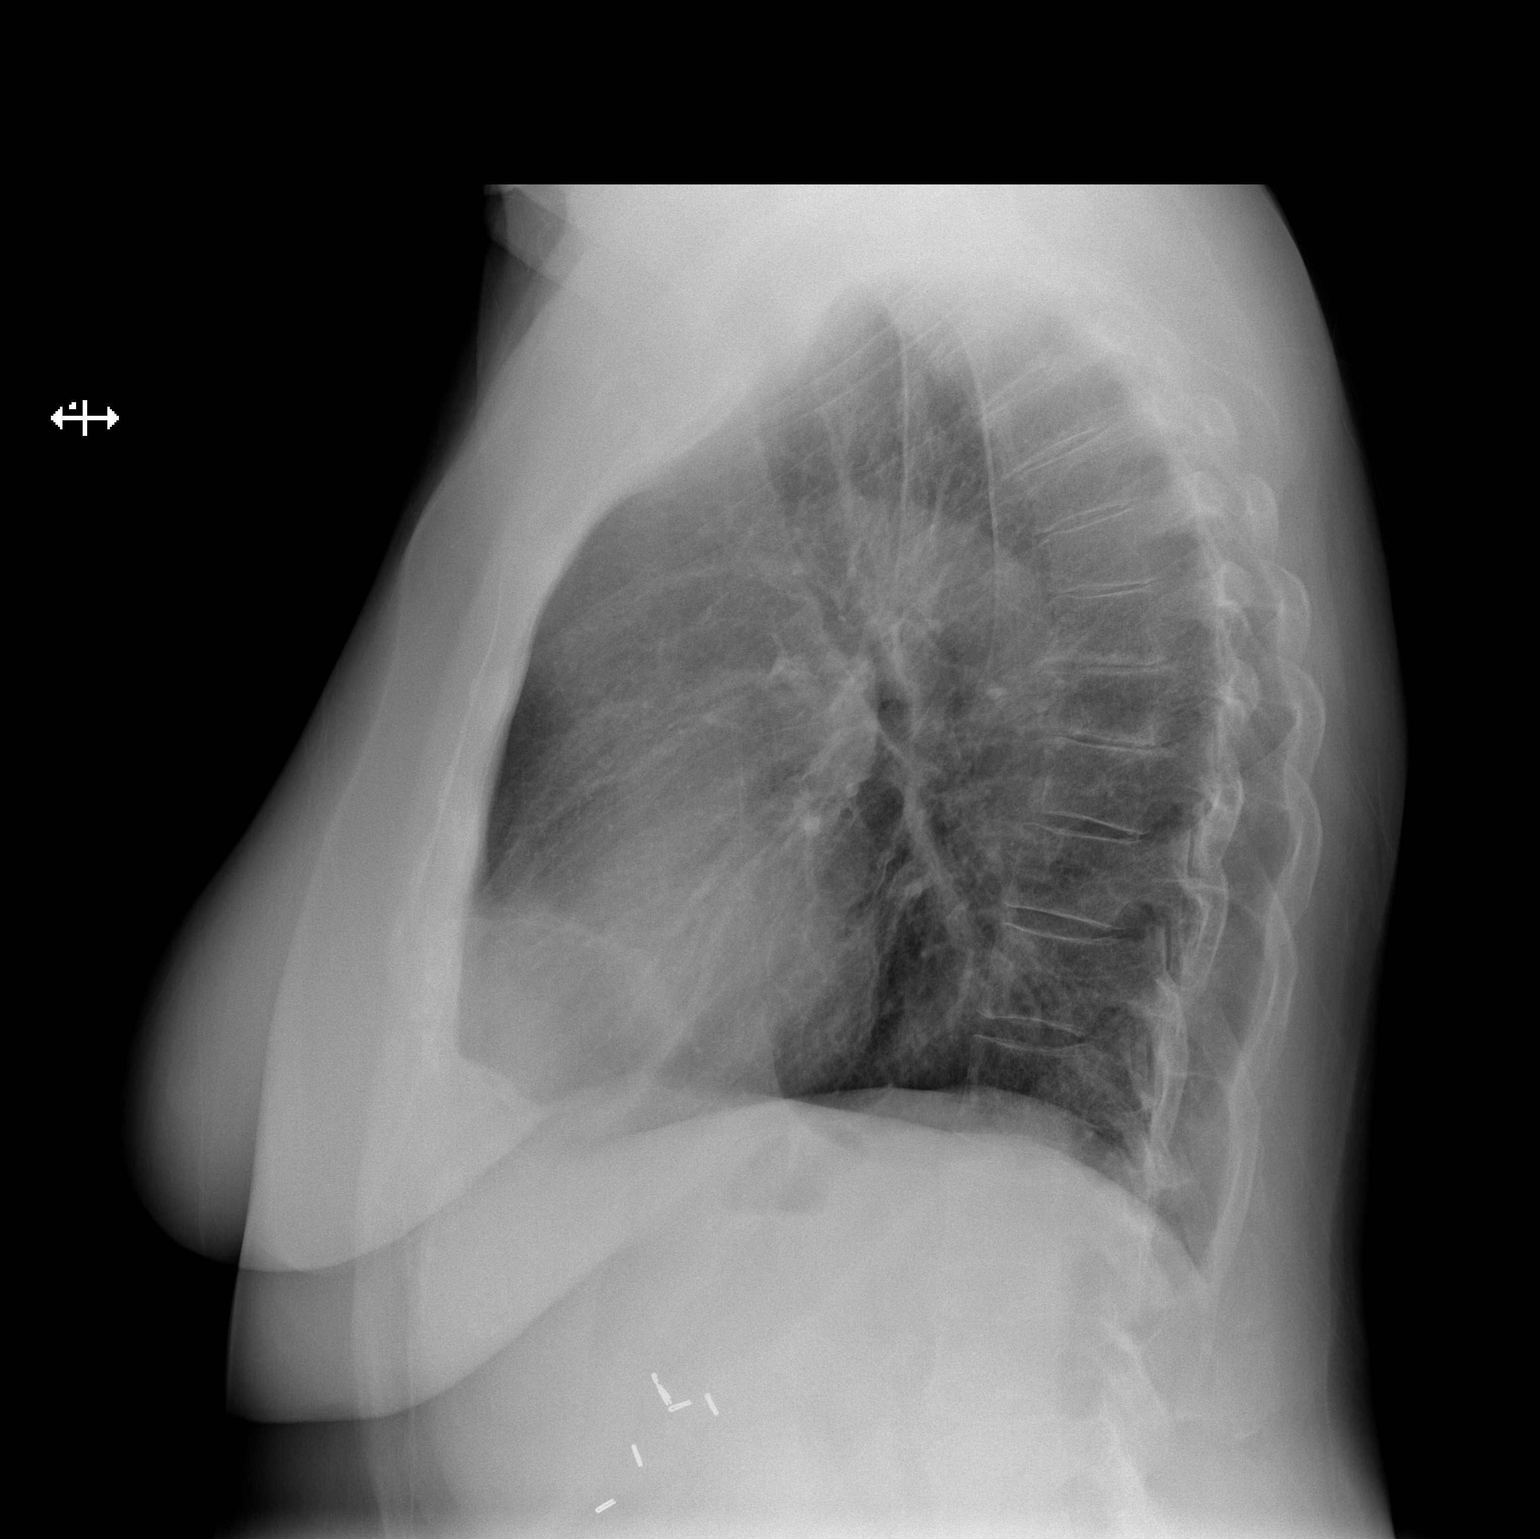

[2 of 2 positions shown; findings below may reference images not displayed]

FINDINGS: Heart and mediastinal contours are within normal limits. No focal
opacities or effusions. No acute bony abnormality.
IMPRESSION: No active cardiopulmonary disease.

## 2017-09-16 ENCOUNTER — Other Ambulatory Visit: Payer: Self-pay | Admitting: Allergy and Immunology

## 2017-09-16 NOTE — Telephone Encounter (Signed)
Needs appt for further refills.

## 2017-09-20 ENCOUNTER — Encounter (HOSPITAL_COMMUNITY): Payer: Self-pay | Admitting: *Deleted

## 2017-09-20 ENCOUNTER — Other Ambulatory Visit: Payer: Self-pay

## 2017-09-20 ENCOUNTER — Emergency Department (HOSPITAL_COMMUNITY)
Admission: EM | Admit: 2017-09-20 | Discharge: 2017-09-20 | Disposition: A | Discharge status: Home / Self Care | Payer: Medicare HMO | Attending: Emergency Medicine | Admitting: Emergency Medicine

## 2017-09-20 ENCOUNTER — Emergency Department (HOSPITAL_COMMUNITY): Payer: Medicare HMO

## 2017-09-20 DIAGNOSIS — Z79899 Other long term (current) drug therapy: Secondary | ICD-10-CM | POA: Insufficient documentation

## 2017-09-20 DIAGNOSIS — R0602 Shortness of breath: Secondary | ICD-10-CM | POA: Insufficient documentation

## 2017-09-20 DIAGNOSIS — Z87891 Personal history of nicotine dependence: Secondary | ICD-10-CM | POA: Diagnosis not present

## 2017-09-20 DIAGNOSIS — E785 Hyperlipidemia, unspecified: Secondary | ICD-10-CM | POA: Insufficient documentation

## 2017-09-20 DIAGNOSIS — Z86718 Personal history of other venous thrombosis and embolism: Secondary | ICD-10-CM | POA: Diagnosis not present

## 2017-09-20 DIAGNOSIS — I5032 Chronic diastolic (congestive) heart failure: Secondary | ICD-10-CM | POA: Diagnosis not present

## 2017-09-20 DIAGNOSIS — R079 Chest pain, unspecified: Secondary | ICD-10-CM | POA: Diagnosis not present

## 2017-09-20 DIAGNOSIS — I110 Hypertensive heart disease with heart failure: Secondary | ICD-10-CM | POA: Insufficient documentation

## 2017-09-20 DIAGNOSIS — J45909 Unspecified asthma, uncomplicated: Secondary | ICD-10-CM | POA: Insufficient documentation

## 2017-09-20 DIAGNOSIS — E039 Hypothyroidism, unspecified: Secondary | ICD-10-CM | POA: Diagnosis not present

## 2017-09-20 DIAGNOSIS — E119 Type 2 diabetes mellitus without complications: Secondary | ICD-10-CM | POA: Insufficient documentation

## 2017-09-20 LAB — BRAIN NATRIURETIC PEPTIDE: B Natriuretic Peptide: 29.5 pg/mL (ref 0.0–100.0)

## 2017-09-20 LAB — BASIC METABOLIC PANEL
Anion gap: 9 (ref 5–15)
BUN: 9 mg/dL (ref 8–23)
CO2: 30 mmol/L (ref 22–32)
Calcium: 9.4 mg/dL (ref 8.9–10.3)
Chloride: 102 mmol/L (ref 98–111)
Creatinine, Ser: 0.99 mg/dL (ref 0.44–1.00)
GFR calc Af Amer: >60 mL/min (ref >60)
GFR calc non Af Amer: 54 mL/min — ABNORMAL LOW (ref >60)
Glucose, Bld: 116 mg/dL — ABNORMAL HIGH (ref 70–99)
Potassium: 3.8 mmol/L (ref 3.5–5.1)
Sodium: 141 mmol/L (ref 135–145)

## 2017-09-20 LAB — CBC
HCT: 37.5 % (ref 36.0–46.0)
Hemoglobin: 12.6 g/dL (ref 12.0–15.0)
MCH: 27.5 pg (ref 26.0–34.0)
MCHC: 33.6 g/dL (ref 30.0–36.0)
MCV: 81.9 fL (ref 78.0–100.0)
Platelets: 217 10*3/uL (ref 150–400)
RBC: 4.58 MIL/uL (ref 3.87–5.11)
RDW: 15.3 % (ref 11.5–15.5)
WBC: 6.2 10*3/uL (ref 4.0–10.5)

## 2017-09-20 LAB — POCT I-STAT TROPONIN I: Troponin i, poc: 0.01 ng/mL (ref 0.00–0.08)

## 2017-09-20 MED ORDER — IPRATROPIUM-ALBUTEROL 0.5-2.5 (3) MG/3ML IN SOLN
3.0000 mL | Freq: Once | RESPIRATORY_TRACT | Status: AC
  Administered 2017-09-20: 3 mL via RESPIRATORY_TRACT
  Filled 2017-09-20: qty 3

## 2017-09-20 MED ORDER — METHYLPREDNISOLONE SODIUM SUCC 125 MG IJ SOLR
125.0000 mg | Freq: Once | INTRAMUSCULAR | Status: AC
  Administered 2017-09-20: 125 mg via INTRAVENOUS
  Filled 2017-09-20: qty 2

## 2017-09-20 MED ORDER — PREDNISONE 20 MG PO TABS: 40.0000 mg | ORAL_TABLET | Freq: Every day | ORAL | 0 refills | Status: DC

## 2017-09-20 NOTE — ED Provider Notes (Signed)
[facility] DEPT Provider Note   CSN: [id number] Arrival date & time: 09/20/17  1416     History   Chief Complaint Chief Complaint  Patient presents with  . Shortness of Breath  . Chest Pain    HPI Andrea Lowery is a 77 y.o. female.  HPI Presents with concern of cough, postnasal drainage, chest tightness, back tightness. Onset was 2 days ago, after she went to the circus with her choir group. Patient notes that she has a history of reactive airway disease. In spite of using albuterol, she has had persistency of symptoms, with ongoing discomfort throughout her upper thorax, described as tightness, soreness. No fever, nausea, no vomiting, no abdominal pain. Patient states that she takes all of medication as directed, acknowledges multiple medical issues, but states that she was doing generally well prior to the onset of this illness. Past Medical History:  Diagnosis Date  . Arthritis   . Asthma   . Diabetes mellitus without complication ([place])    Diabetes II  . DVT (deep venous thrombosis) ([place])    "Years ago"  . Dyslipidemia   . GERD (gastroesophageal reflux disease)   . Headache    Migraine  . Hypertension   . Hypothyroidism   . LBBB (left bundle branch block)   . MGUS (monoclonal gammopathy of unknown significance)   . MGUS (monoclonal gammopathy of unknown significance)   . Migraine   . OSA on CPAP     Patient Active Problem List   Diagnosis Date Noted  . Hypokalemia 04/30/2016  . Impingement syndrome of left shoulder 04/05/2016  . Carotid artery disease ([place]) 01/19/2016  . Dyslipidemia 01/19/2016  . History of lumbar laminectomy for spinal cord decompression 07/31/2015  . Chronic pain of right hip 05/02/2015  . Temporal arteritis ([place]) 11/07/2014  . Headache 11/07/2014  . Diabetes mellitus type 2, controlled ([place]) 11/07/2014  . Diastolic CHF ([place]) 83/15/1761  . Sacroiliac dysfunction 11/04/2014  . Trochanteric bursitis  of right hip 11/04/2014  . Asthma 10/05/2014  . GERD (gastroesophageal reflux disease) 10/05/2014  . Allergic rhinitis 10/05/2014  . Multinodular goiter 08/09/2014  . Disorder of sacroiliac joint 07/29/2014  . 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 Hypersomnolence 04/16/2014  . Snoring 04/16/2014  . Preventive measure 01/25/2014  . Acute diastolic CHF (congestive heart failure), NYHA class 2 ([place]) 07/29/2013  . Bilateral leg edema 07/20/2013  . 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 Essential hypertension 07/01/2012  . LBBB (left bundle branch block) 07/01/2012  . DOE (dyspnea on exertion) 07/01/2012  . OSA on CPAP 07/01/2012    Past Surgical History:  Procedure Laterality Date  . ABDOMINAL HYSTERECTOMY  1969  . ARTERY BIOPSY N/A 11/07/2014   Procedure: BIOPSY TEMPORAL ARTERY;  Surgeon: Leta Baptist, MD;  Location: [facility];  Service: ENT;  Laterality: N/A;  . CARPAL TUNNEL RELEASE Right   . CHOLECYSTECTOMY  1976  . LUMBAR LAMINECTOMY/DECOMPRESSION MICRODISCECTOMY N/A 07/31/2015   Procedure: L4-5 Decompression, Possible Right L4-5 Microdiscectomy;  Surgeon: Marybelle Killings, MD;  Location: [facility];  Service: Orthopedics;  Laterality: N/A;  . NM MYOVIEW LTD  02/13/2010   No ischemia  . PLANTAR FASCIA RELEASE    . TONSILLECTOMY  1965  . US ECHOCARDIOGRAPHY  09/20/2008   borderline LVH,mild TR,AOV mildly sclerotic w/ca+ of the leaflets     OB History   None      Home Medications    Prior to Admission  medications   Medication Sig Start Date End Date Taking? Authorizing Provider  albuterol (PROVENTIL HFA;VENTOLIN HFA) 108 (90 BASE) MCG/ACT inhaler Inhale 2 puffs into the lungs every 4 (four) hours as needed for wheezing or shortness of breath.     [provider]  alendronate (FOSAMAX) 70 MG tablet Take 70 mg by mouth every Wednesday.  05/30/15   [provider]  beclomethasone (QVAR) 80 MCG/ACT inhaler  Inhale 2 puffs into the lungs 2 (two) times daily.    [provider]  Calcium Carb-Cholecalciferol (CALCIUM 600 + D PO) Take 600 mg by mouth daily.     [provider]  cetirizine (ZYRTEC) 10 MG tablet Take 10 mg by mouth daily.     [provider]  cycloSPORINE (RESTASIS) 0.05 % ophthalmic emulsion Place 1 drop into both eyes at bedtime.     [provider]  EPINEPHrine 0.3 mg/0.3 mL IJ SOAJ injection Use as directed for severe allergic reaction 06/05/17   Kozlow, Donnamarie Poag, MD  esomeprazole (NEXIUM) 40 MG capsule Take 40 mg by mouth 2 (two) times daily.     [provider]  ezetimibe (ZETIA) 10 MG tablet take 1 tablet by mouth once daily 02/17/17   Hilty, Nadean Corwin, MD  fluticasone (FLONASE) 50 MCG/ACT nasal spray INSTILL 2 SPRAYS INTO EACH NOSTRIL ONCE DAILY 07/09/17   Kozlow, Donnamarie Poag, MD  ipratropium-albuterol (DUONEB) 0.5-2.5 (3) MG/3ML SOLN USE 1 VIAL IN NEBULIZER EVERY 4 TO 6 HOURS AS NEEDED FOR COUGHING WHEEZING SHORTNESS OF BREATH 09/16/17   Kozlow, Donnamarie Poag, MD  levothyroxine (SYNTHROID, LEVOTHROID) 50 MCG tablet take 1 tablet by mouth once daily Patient taking differently: take 1 tablet (50 mcg) by mouth once daily 08/04/15   Renato Shin, MD  metoprolol succinate (TOPROL-XL) 25 MG 24 hr tablet Take 25 mg by mouth daily.    [provider]  montelukast (SINGULAIR) 10 MG tablet Take 10 mg by mouth at bedtime.    [provider]  Olopatadine HCl (PAZEO) 0.7 % SOLN Place 1 drop into both eyes daily as needed. 06/05/17   Kozlow, Donnamarie Poag, MD  predniSONE (DELTASONE) 20 MG tablet Take 2 tablets (40 mg total) by mouth daily with breakfast. For the next four days 09/20/17   Carmin Muskrat, MD  rosuvastatin (CRESTOR) 40 MG tablet take 1 tablet by mouth once daily 02/17/17   Hilty, Nadean Corwin, MD    Family History Family History  Problem Relation Age of Onset  . Diabetes Father   . Heart failure Father   . Hypertension Father   . Thyroid disease  Sister   . Hypertension Sister     Social History Social History   Tobacco Use  . Smoking status: Former Smoker    Last attempt to quit: 02/04/2006    Years since quitting: 11.6  . Smokeless tobacco: Never Used  Substance Use Topics  . Alcohol use: No    Alcohol/week: 0.0 standard drinks  . Drug use: No     Allergies   Bee venom and Penicillins   Review of Systems Review of Systems  Constitutional:       Per HPI, otherwise negative  HENT:       Per HPI, otherwise negative  Respiratory:       Per HPI, otherwise negative  Cardiovascular:       Per HPI, otherwise negative  Gastrointestinal: Negative for vomiting.  Endocrine:       Negative aside from HPI  Genitourinary:  Neg aside from HPI   Musculoskeletal:       Per HPI, otherwise negative  Skin: Negative.   Neurological: Negative for syncope.     Physical Exam Updated Vital Signs BP 136/75   Pulse 64   Temp 98.1 F (36.7 C) (Oral)   Resp 16   Ht 5\' 7"  (1.702 m)   Wt 88.5 kg   SpO2 99%   BMI 30.54 kg/m   Physical Exam  Constitutional: She is oriented to person, place, and time. She appears well-developed and well-nourished. No distress.  HENT:  Head: Normocephalic and atraumatic.  Eyes: Conjunctivae and EOM are normal.  Cardiovascular: Normal rate and regular rhythm.  Pulmonary/Chest: She has decreased breath sounds.  Abdominal: She exhibits no distension.  Musculoskeletal: She exhibits no edema.  Neurological: She is alert and oriented to person, place, and time. No cranial nerve deficit.  Skin: Skin is warm and dry.  Psychiatric: She has a normal mood and affect.  Nursing note and vitals reviewed.    ED Treatments / Results  Labs (all labs ordered are listed, but only abnormal results are displayed) Labs Reviewed  BASIC METABOLIC PANEL - Abnormal; Notable for the following components:      Result Value   Glucose, Bld 116 (*)    GFR calc non Af Amer 54 (*)    All other components  within normal limits  CBC  BRAIN NATRIURETIC PEPTIDE  I-STAT TROPONIN, ED  POCT I-STAT TROPONIN I    EKG EKG Interpretation  Date/Time:  Saturday September 20 2017 14:34:09 EDT Ventricular Rate:  83 PR Interval:    QRS Duration: 142 QT Interval:  444 QTC Calculation: 522 R Axis:   -47 Text Interpretation:  Sinus rhythm Nonspecific IVCD with LAD Left ventricular hypertrophy Anterior Q waves, possibly due to LVH No significant change since last tracing Abnormal ekg Confirmed by Carmin Muskrat [phone]) on 09/20/2017 4:22:38 PM   Radiology Dg Chest 2 View  Result Date: 09/20/2017 CLINICAL DATA:  77 year old with chest tightness and pain. EXAM: CHEST - 2 VIEW COMPARISON:  07/09/2016 FINDINGS: Lungs are clear. Heart and mediastinum are within normal limits. Surgical clips in the right upper abdomen. No large pleural effusions. Degenerative endplate disease in the midthoracic spine. No large pleural effusions. IMPRESSION: No active cardiopulmonary disease. Electronically Signed   By: Markus Daft M.D.   On: 09/20/2017 15:11    Procedures Procedures (including critical care time)  Medications Ordered in ED Medications  ipratropium-albuterol (DUONEB) 0.5-2.5 (3) MG/3ML nebulizer solution 3 mL (3 mLs Nebulization Given 09/20/17 1800)  methylPREDNISolone sodium succinate (SOLU-MEDROL) 125 mg/2 mL injection 125 mg (125 mg Intravenous Given 09/20/17 1808)     Initial Impression / Assessment and Plan / ED Course  I have reviewed the triage vital signs and the nursing notes.  Pertinent labs & imaging results that were available during my care of the patient were reviewed by me and considered in my medical decision making (see chart for details).     7:34 PM Patient substantially better.  This patient with reactive airway disease presents with ongoing dyspnea, cough, generalized discomfort, and is found to have diminished breath sounds on initial exam. Given her history, there is some  suspicion for bronchitis. No evidence for pneumonia.  No evidence for heart failure, ACS, or other acute new pathology. Given her improvement here with bronchodilators, steroids, the patient was discharged with same, close outpatient follow-up.   Final Clinical Impressions(s) / ED Diagnoses   Final diagnoses:  Shortness of breath    ED Discharge Orders         Ordered    predniSONE (DELTASONE) 20 MG tablet  Daily with breakfast     09/20/17 1934           Carmin Muskrat, MD 09/20/17 1935

## 2017-09-20 NOTE — ED Triage Notes (Signed)
Pt complains of chest tightness, coughing, shortness of breath. Pt states her allergies flared up while at a circus a few days ago. Pt states her nose has been running and irritating her throat.

## 2017-09-20 NOTE — Discharge Instructions (Signed)
As discussed, in addition to the prescribed steroids, please use your albuterol every 4-6 hours for the next 2 days.  Return here for concerning changes in your condition.

## 2017-09-20 NOTE — ED Notes (Signed)
ED Provider at bedside.[place] 

## 2017-09-25 ENCOUNTER — Ambulatory Visit (INDEPENDENT_AMBULATORY_CARE_PROVIDER_SITE_OTHER): Payer: Medicare HMO | Admitting: *Deleted

## 2017-09-25 DIAGNOSIS — J4550 Severe persistent asthma, uncomplicated: Secondary | ICD-10-CM | POA: Diagnosis not present

## 2017-09-25 MED ORDER — BENRALIZUMAB 30 MG/ML ~~LOC~~ SOSY
30.0000 mg | PREFILLED_SYRINGE | SUBCUTANEOUS | Status: AC
  Administered 2017-09-25 – 2023-05-05 (×37): 30 mg via SUBCUTANEOUS

## 2017-09-29 ENCOUNTER — Ambulatory Visit (INDEPENDENT_AMBULATORY_CARE_PROVIDER_SITE_OTHER): Payer: Medicare HMO | Admitting: *Deleted

## 2017-09-29 DIAGNOSIS — J309 Allergic rhinitis, unspecified: Secondary | ICD-10-CM

## 2017-10-02 ENCOUNTER — Ambulatory Visit (INDEPENDENT_AMBULATORY_CARE_PROVIDER_SITE_OTHER): Payer: Medicare HMO | Admitting: *Deleted

## 2017-10-02 DIAGNOSIS — T63441D Toxic effect of venom of bees, accidental (unintentional), subsequent encounter: Secondary | ICD-10-CM | POA: Diagnosis not present

## 2017-10-03 ENCOUNTER — Other Ambulatory Visit: Payer: Self-pay | Admitting: Allergy and Immunology

## 2017-10-03 NOTE — Telephone Encounter (Signed)
Courtesy refill  

## 2017-10-07 ENCOUNTER — Ambulatory Visit (INDEPENDENT_AMBULATORY_CARE_PROVIDER_SITE_OTHER): Payer: Medicare HMO

## 2017-10-07 DIAGNOSIS — J309 Allergic rhinitis, unspecified: Secondary | ICD-10-CM

## 2017-10-08 ENCOUNTER — Other Ambulatory Visit: Payer: Self-pay | Admitting: Internal Medicine

## 2017-10-08 DIAGNOSIS — E785 Hyperlipidemia, unspecified: Secondary | ICD-10-CM

## 2017-10-15 ENCOUNTER — Encounter: Payer: Self-pay | Admitting: *Deleted

## 2017-10-15 NOTE — Progress Notes (Signed)
Vials made. Exp: 10-16-18. hv 

## 2017-10-16 ENCOUNTER — Other Ambulatory Visit: Payer: Self-pay | Admitting: Allergy and Immunology

## 2017-10-17 DIAGNOSIS — J3089 Other allergic rhinitis: Secondary | ICD-10-CM

## 2017-10-20 DIAGNOSIS — J301 Allergic rhinitis due to pollen: Secondary | ICD-10-CM

## 2017-10-23 ENCOUNTER — Ambulatory Visit (INDEPENDENT_AMBULATORY_CARE_PROVIDER_SITE_OTHER): Payer: Medicare HMO | Admitting: *Deleted

## 2017-10-23 DIAGNOSIS — J309 Allergic rhinitis, unspecified: Secondary | ICD-10-CM | POA: Diagnosis not present

## 2017-10-24 ENCOUNTER — Other Ambulatory Visit: Payer: Self-pay | Admitting: Allergy and Immunology

## 2017-10-29 ENCOUNTER — Other Ambulatory Visit: Payer: Self-pay | Admitting: Allergy and Immunology

## 2017-10-29 NOTE — Telephone Encounter (Signed)
Courtesy refill  

## 2017-11-16 ENCOUNTER — Other Ambulatory Visit: Payer: Self-pay | Admitting: Allergy and Immunology

## 2017-11-20 ENCOUNTER — Ambulatory Visit: Payer: Medicare HMO

## 2017-11-24 ENCOUNTER — Ambulatory Visit (INDEPENDENT_AMBULATORY_CARE_PROVIDER_SITE_OTHER): Payer: Medicare HMO

## 2017-11-24 DIAGNOSIS — J4550 Severe persistent asthma, uncomplicated: Secondary | ICD-10-CM

## 2017-11-26 ENCOUNTER — Ambulatory Visit (INDEPENDENT_AMBULATORY_CARE_PROVIDER_SITE_OTHER): Payer: Medicare HMO

## 2017-11-26 DIAGNOSIS — J309 Allergic rhinitis, unspecified: Secondary | ICD-10-CM

## 2017-11-28 ENCOUNTER — Ambulatory Visit (INDEPENDENT_AMBULATORY_CARE_PROVIDER_SITE_OTHER): Payer: Medicare HMO

## 2017-11-28 DIAGNOSIS — T63441D Toxic effect of venom of bees, accidental (unintentional), subsequent encounter: Secondary | ICD-10-CM | POA: Diagnosis not present

## 2017-11-30 ENCOUNTER — Other Ambulatory Visit: Payer: Self-pay | Admitting: Allergy and Immunology

## 2017-12-05 ENCOUNTER — Ambulatory Visit (INDEPENDENT_AMBULATORY_CARE_PROVIDER_SITE_OTHER): Payer: Medicare HMO | Admitting: *Deleted

## 2017-12-05 DIAGNOSIS — J309 Allergic rhinitis, unspecified: Secondary | ICD-10-CM | POA: Diagnosis not present

## 2017-12-08 ENCOUNTER — Ambulatory Visit (INDEPENDENT_AMBULATORY_CARE_PROVIDER_SITE_OTHER): Payer: Medicare HMO | Admitting: *Deleted

## 2017-12-08 DIAGNOSIS — J309 Allergic rhinitis, unspecified: Secondary | ICD-10-CM | POA: Diagnosis not present

## 2017-12-18 ENCOUNTER — Ambulatory Visit (INDEPENDENT_AMBULATORY_CARE_PROVIDER_SITE_OTHER): Payer: Medicare HMO | Admitting: *Deleted

## 2017-12-18 DIAGNOSIS — J309 Allergic rhinitis, unspecified: Secondary | ICD-10-CM

## 2017-12-25 ENCOUNTER — Ambulatory Visit (INDEPENDENT_AMBULATORY_CARE_PROVIDER_SITE_OTHER): Payer: Medicare HMO | Admitting: *Deleted

## 2017-12-25 DIAGNOSIS — J309 Allergic rhinitis, unspecified: Secondary | ICD-10-CM

## 2017-12-28 ENCOUNTER — Other Ambulatory Visit: Payer: Self-pay | Admitting: Allergy and Immunology

## 2017-12-29 NOTE — Telephone Encounter (Signed)
Courtesy refill  

## 2018-01-09 ENCOUNTER — Other Ambulatory Visit: Payer: Self-pay | Admitting: Internal Medicine

## 2018-01-09 ENCOUNTER — Other Ambulatory Visit: Payer: Self-pay

## 2018-01-09 DIAGNOSIS — E785 Hyperlipidemia, unspecified: Secondary | ICD-10-CM

## 2018-01-09 MED ORDER — EZETIMIBE 10 MG PO TABS: 10.0000 mg | ORAL_TABLET | Freq: Every day | ORAL | 0 refills | Status: DC

## 2018-01-19 ENCOUNTER — Ambulatory Visit (INDEPENDENT_AMBULATORY_CARE_PROVIDER_SITE_OTHER): Payer: Medicare HMO | Admitting: *Deleted

## 2018-01-19 DIAGNOSIS — J4550 Severe persistent asthma, uncomplicated: Secondary | ICD-10-CM

## 2018-01-23 ENCOUNTER — Ambulatory Visit: Payer: Self-pay

## 2018-01-25 ENCOUNTER — Other Ambulatory Visit: Payer: Self-pay | Admitting: Allergy and Immunology

## 2018-02-06 ENCOUNTER — Other Ambulatory Visit: Payer: Self-pay | Admitting: Internal Medicine

## 2018-02-06 DIAGNOSIS — E785 Hyperlipidemia, unspecified: Secondary | ICD-10-CM

## 2018-03-10 ENCOUNTER — Ambulatory Visit (INDEPENDENT_AMBULATORY_CARE_PROVIDER_SITE_OTHER): Payer: Medicare HMO | Admitting: *Deleted

## 2018-03-10 DIAGNOSIS — T63441D Toxic effect of venom of bees, accidental (unintentional), subsequent encounter: Secondary | ICD-10-CM

## 2018-03-13 ENCOUNTER — Ambulatory Visit (INDEPENDENT_AMBULATORY_CARE_PROVIDER_SITE_OTHER): Payer: Medicare HMO

## 2018-03-13 DIAGNOSIS — J309 Allergic rhinitis, unspecified: Secondary | ICD-10-CM

## 2018-03-16 ENCOUNTER — Ambulatory Visit (INDEPENDENT_AMBULATORY_CARE_PROVIDER_SITE_OTHER): Payer: Medicare HMO | Admitting: *Deleted

## 2018-03-16 DIAGNOSIS — J4550 Severe persistent asthma, uncomplicated: Secondary | ICD-10-CM

## 2018-03-23 ENCOUNTER — Ambulatory Visit (INDEPENDENT_AMBULATORY_CARE_PROVIDER_SITE_OTHER): Payer: Medicare HMO

## 2018-03-23 DIAGNOSIS — J309 Allergic rhinitis, unspecified: Secondary | ICD-10-CM

## 2018-04-01 ENCOUNTER — Ambulatory Visit (INDEPENDENT_AMBULATORY_CARE_PROVIDER_SITE_OTHER): Payer: Medicare HMO

## 2018-04-01 DIAGNOSIS — J309 Allergic rhinitis, unspecified: Secondary | ICD-10-CM | POA: Diagnosis not present

## 2018-04-09 ENCOUNTER — Ambulatory Visit (INDEPENDENT_AMBULATORY_CARE_PROVIDER_SITE_OTHER): Payer: Medicare HMO | Admitting: *Deleted

## 2018-04-09 DIAGNOSIS — J309 Allergic rhinitis, unspecified: Secondary | ICD-10-CM | POA: Diagnosis not present

## 2018-04-21 ENCOUNTER — Other Ambulatory Visit: Payer: Self-pay

## 2018-04-21 DIAGNOSIS — D472 Monoclonal gammopathy: Secondary | ICD-10-CM

## 2018-04-23 ENCOUNTER — Telehealth: Payer: Self-pay | Admitting: Hematology and Oncology

## 2018-04-23 ENCOUNTER — Inpatient Hospital Stay: Payer: Medicare HMO

## 2018-04-23 ENCOUNTER — Telehealth: Payer: Self-pay

## 2018-04-23 NOTE — Telephone Encounter (Signed)
R/s  appt per 3/19 sch message. Pt aware of new appt date and time

## 2018-04-23 NOTE — Telephone Encounter (Signed)
-----   Message from Heath Lark, MD sent at 04/23/2018  7:53 AM EDT ----- Regarding: reschedule She is 1 year follow-up Move her out 1 month, unless she has symptoms Labs to be done 1 week before appt

## 2018-04-23 NOTE — Telephone Encounter (Signed)
Called and left a message asking to call the office regarding appt.  Called and spoke with son, given below message. He will get his mom to call the office back.

## 2018-04-23 NOTE — Telephone Encounter (Signed)
Called back and given below message. She has no new symptoms and would like to reschedule. Scheduling message sent.

## 2018-04-30 ENCOUNTER — Ambulatory Visit: Payer: Medicare HMO | Admitting: Hematology and Oncology

## 2018-05-05 ENCOUNTER — Ambulatory Visit: Payer: Medicare HMO

## 2018-05-11 ENCOUNTER — Ambulatory Visit: Payer: Medicare HMO

## 2018-05-11 ENCOUNTER — Ambulatory Visit (INDEPENDENT_AMBULATORY_CARE_PROVIDER_SITE_OTHER): Payer: Medicare HMO | Admitting: *Deleted

## 2018-05-11 DIAGNOSIS — J4550 Severe persistent asthma, uncomplicated: Secondary | ICD-10-CM

## 2018-05-14 IMAGING — MR MR SHOULDER*L* W/O CM
4 of 5 series · 19 of 40 positions shown · non-contrast
Comparison: None.

CLINICAL DATA: Left shoulder pain for 4 months.  No known injury.

EXAM:
MRI OF THE LEFT SHOULDER WITHOUT CONTRAST
TECHNIQUE: Multiplanar, multisequence MR imaging of the shoulder was performed.
No intravenous contrast was administered.

[Series 2: T2 fat-sat · axial · 4.0mm · 0.23mm/px · z∈[-72,+28]mm · 5 of 25 slices shown (1 of 3)]
[im 1/25]
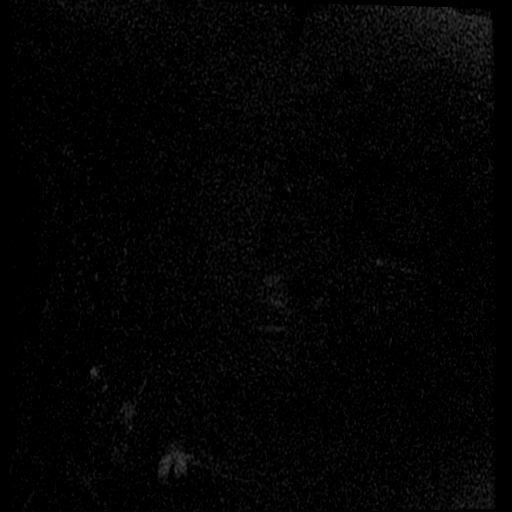
[im 4/25]
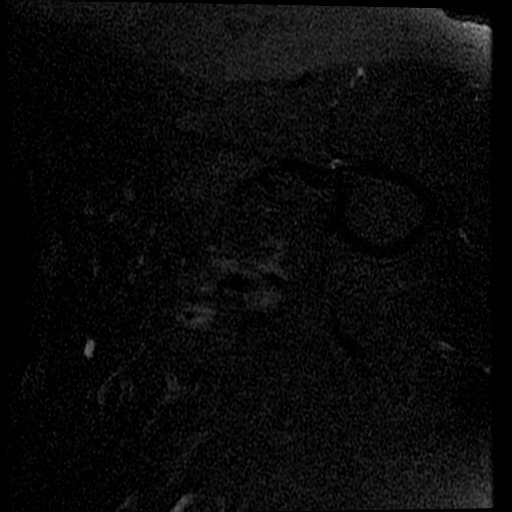
[im 7/25]
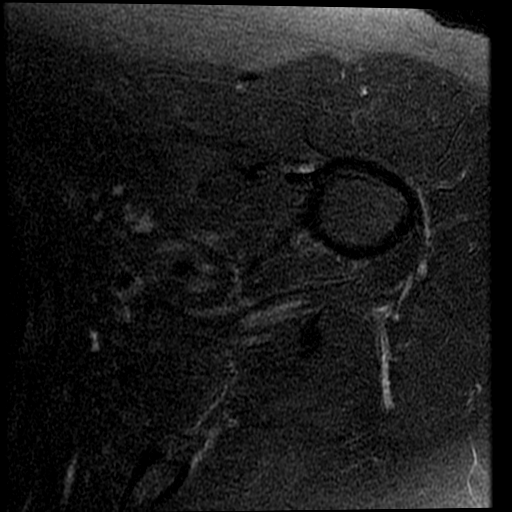
[im 14/25]
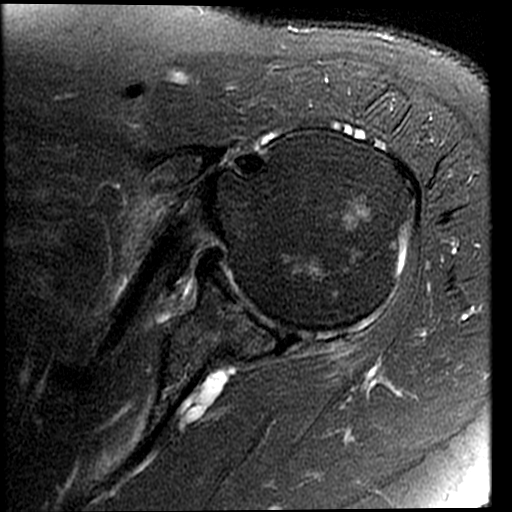
[im 21/25]
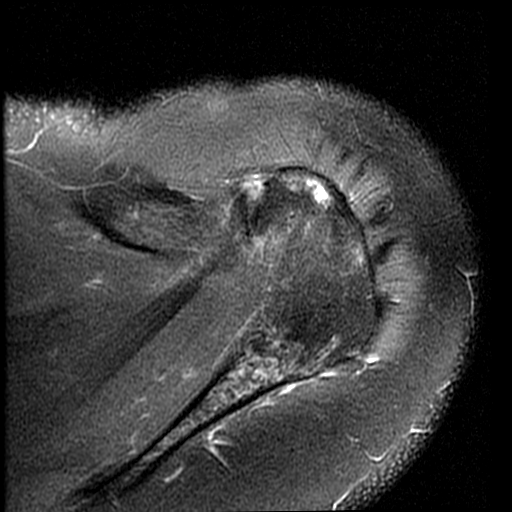

[Series 3: T2 fat-sat · oblique · 4.0mm · 0.31mm/px · 3 of 24 slices shown (2 of 3)]
[im 4/24]
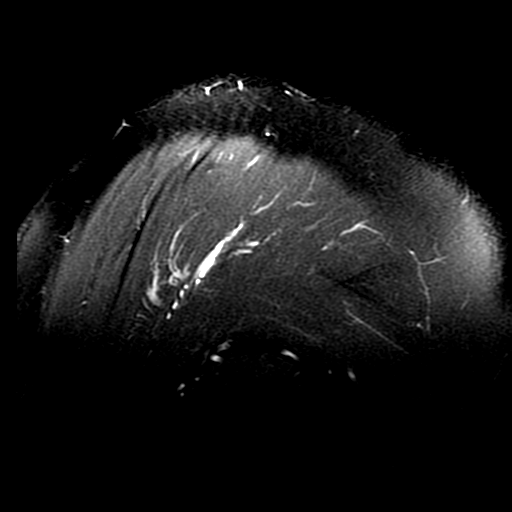
[im 14/24]
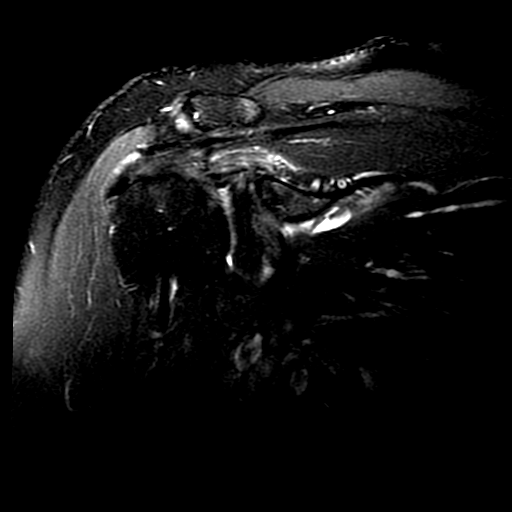
[im 20/24]
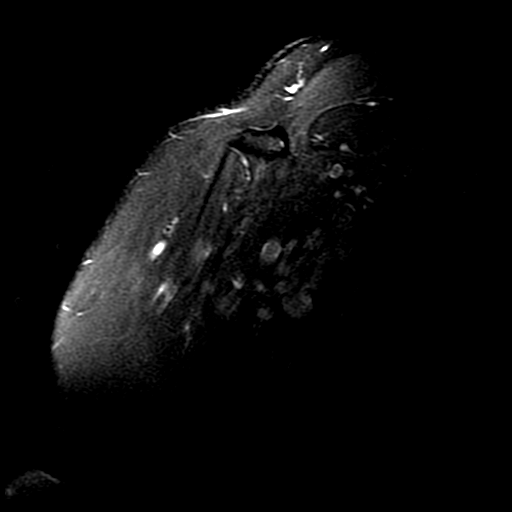

[Series 4: PD · oblique · 4.0mm · 0.31mm/px · 8 of 24 slices shown]
[im 1/24]
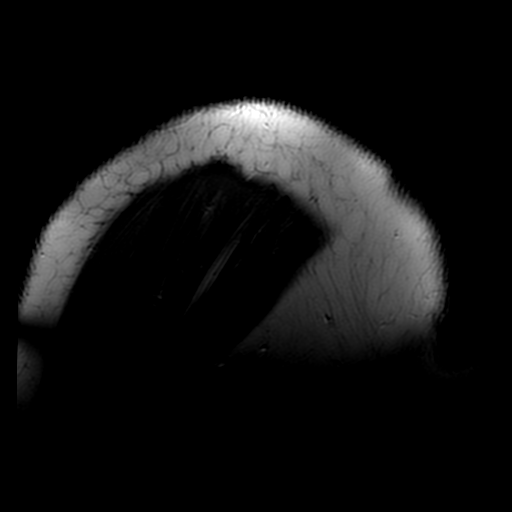
[im 4/24]
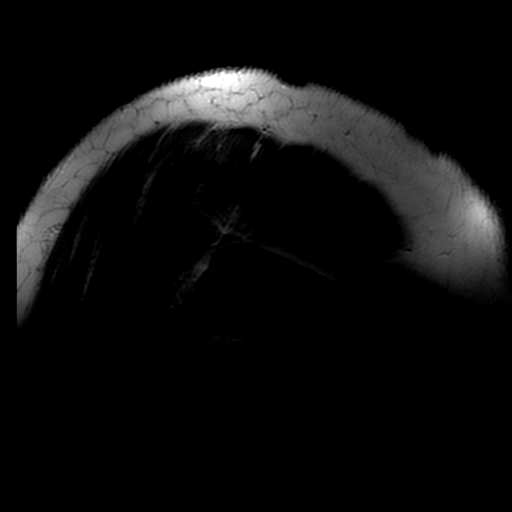
[im 7/24]
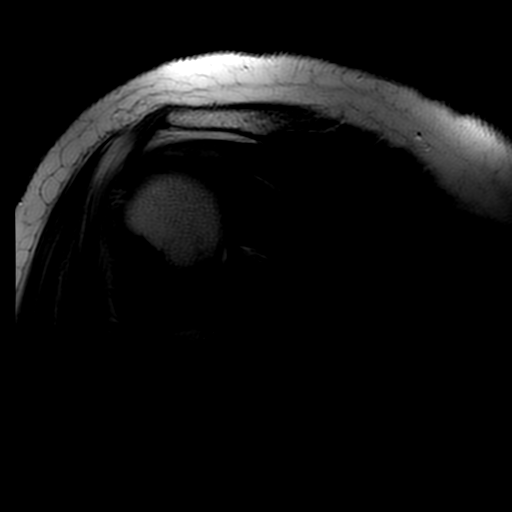
[im 10/24]
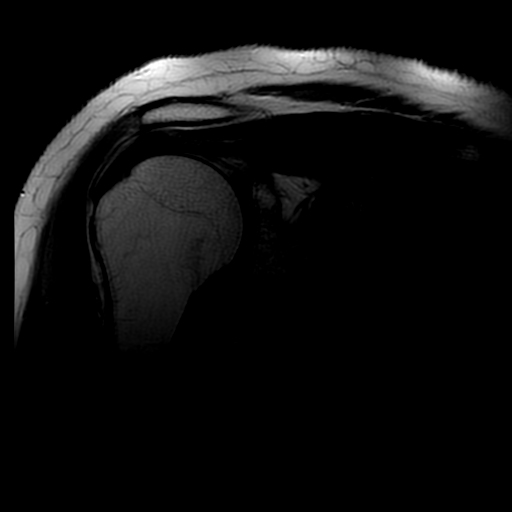
[im 14/24]
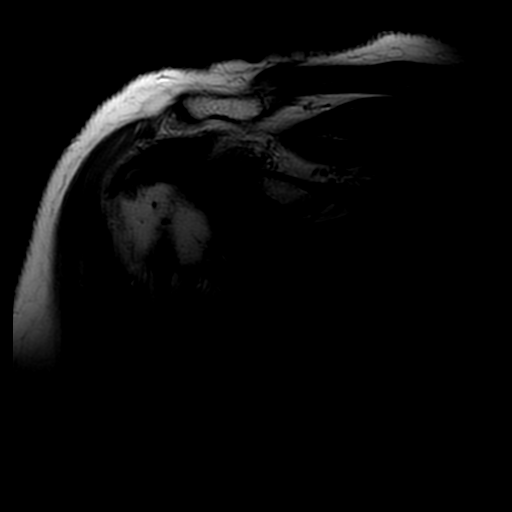
[im 17/24]
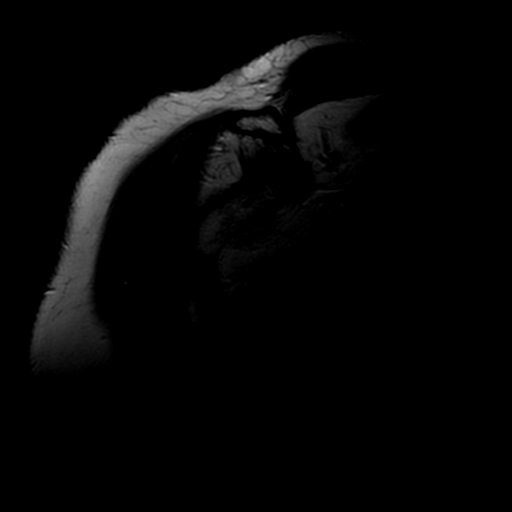
[im 20/24]
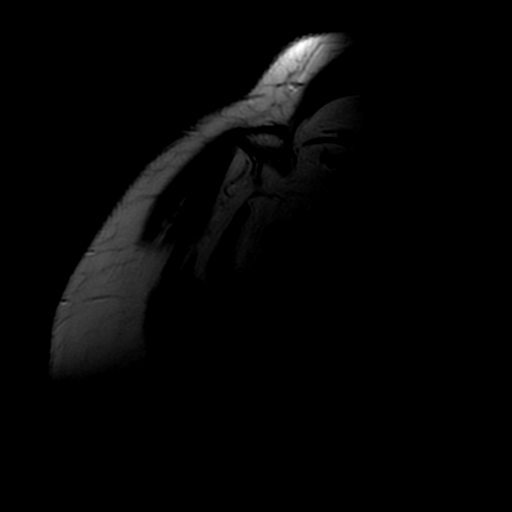
[im 24/24]
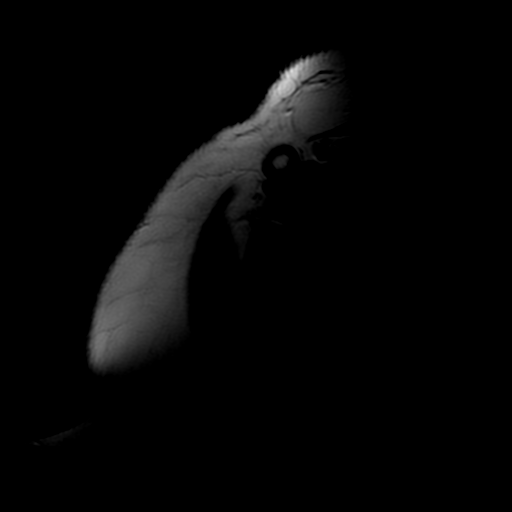

[Series 6: T2 fat-sat · oblique · 4.0mm · 0.29mm/px · 3 of 25 slices shown (3 of 3)]
[im 4/25]
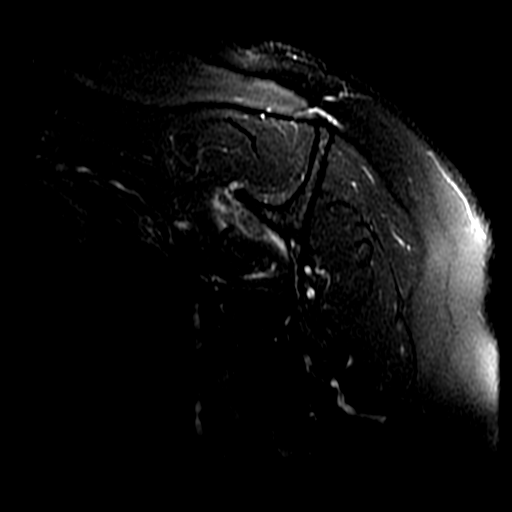
[im 14/25]
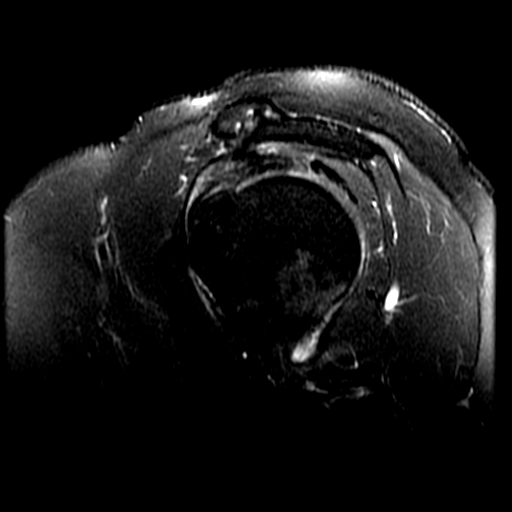
[im 21/25]
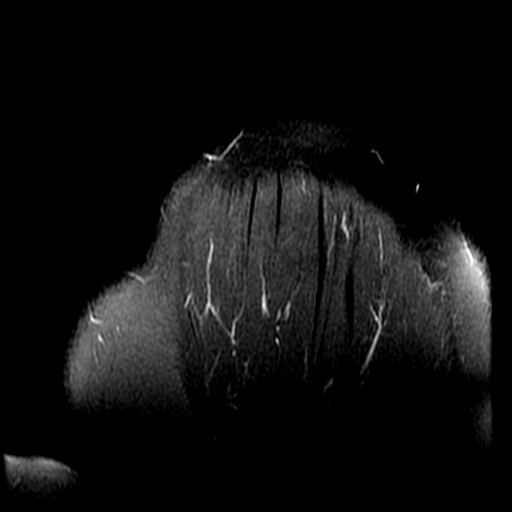

[19 of 40 positions shown; findings below may reference images not displayed]

FINDINGS: Rotator cuff: There is rotator cuff tendinopathy appearing worst in
the supraspinatus where a tear of the leading edge of the tendon
measuring 0.6 cm from front to back is identified. There is little
to no retraction. Thin remnant of bursal sided tendon is intact. No
other tear is identified.

Muscles:  Normal without atrophy or focal lesion.

Biceps long head: Intrasubstance increased T2 signal in the
intra-articular segment of the tendon consistent with tendinopathy
without tear is identified.

Acromioclavicular Joint: Mild osteoarthritis is seen. Type 2
acromion. Small volume of subacromial/subdeltoid fluid is
identified.

Glenohumeral Joint: Mild degenerative change is seen.

Labrum:  The superior labrum is degenerated without tear.

Bones:  No fracture or worrisome marrow lesion.

Other: None.
IMPRESSION: Rotator cuff tendinopathy most notable in the supraspinatus where a
deep articular sided tear of the leading edge of the tendon measures
0.6 cm from front to back. No tendon retraction or atrophy.

Mild acromioclavicular and glenohumeral osteoarthritis.

Small volume of subacromial/subdeltoid fluid consistent bursitis.

## 2018-05-26 NOTE — Progress Notes (Signed)
VIALS EXP 05-26-2019

## 2018-05-27 DIAGNOSIS — J3089 Other allergic rhinitis: Secondary | ICD-10-CM

## 2018-05-29 ENCOUNTER — Inpatient Hospital Stay: Payer: Medicare HMO | Attending: Hematology and Oncology

## 2018-05-29 ENCOUNTER — Other Ambulatory Visit: Payer: Self-pay

## 2018-05-29 DIAGNOSIS — D472 Monoclonal gammopathy: Secondary | ICD-10-CM | POA: Insufficient documentation

## 2018-05-29 LAB — CBC WITH DIFFERENTIAL (CANCER CENTER ONLY)
Abs Immature Granulocytes: 0.01 10*3/uL (ref 0.00–0.07)
Basophils Absolute: 0 10*3/uL (ref 0.0–0.1)
Basophils Relative: 0 %
Eosinophils Absolute: 0 10*3/uL (ref 0.0–0.5)
Eosinophils Relative: 0 %
HCT: 37.2 % (ref 36.0–46.0)
Hemoglobin: 12.1 g/dL (ref 12.0–15.0)
Immature Granulocytes: 0 %
Lymphocytes Relative: 33 %
Lymphs Abs: 1.4 10*3/uL (ref 0.7–4.0)
MCH: 26.7 pg (ref 26.0–34.0)
MCHC: 32.5 g/dL (ref 30.0–36.0)
MCV: 82.1 fL (ref 80.0–100.0)
Monocytes Absolute: 0.4 10*3/uL (ref 0.1–1.0)
Monocytes Relative: 10 %
Neutro Abs: 2.4 10*3/uL (ref 1.7–7.7)
Neutrophils Relative %: 57 %
Platelet Count: 200 10*3/uL (ref 150–400)
RBC: 4.53 MIL/uL (ref 3.87–5.11)
RDW: 14.5 % (ref 11.5–15.5)
WBC Count: 4.2 10*3/uL (ref 4.0–10.5)
nRBC: 0 % (ref 0.0–0.2)

## 2018-05-29 LAB — CMP (CANCER CENTER ONLY)
ALT: 26 U/L (ref 0–44)
AST: 24 U/L (ref 15–41)
Albumin: 3.8 g/dL (ref 3.5–5.0)
Alkaline Phosphatase: 64 U/L (ref 38–126)
Anion gap: 10 (ref 5–15)
BUN: 8 mg/dL (ref 8–23)
CO2: 26 mmol/L (ref 22–32)
Calcium: 9.1 mg/dL (ref 8.9–10.3)
Chloride: 103 mmol/L (ref 98–111)
Creatinine: 1.01 mg/dL — ABNORMAL HIGH (ref 0.44–1.00)
GFR, Est AFR Am: >60 mL/min (ref >60)
GFR, Estimated: 54 mL/min — ABNORMAL LOW (ref >60)
Glucose, Bld: 192 mg/dL — ABNORMAL HIGH (ref 70–99)
Potassium: 3.5 mmol/L (ref 3.5–5.1)
Sodium: 139 mmol/L (ref 135–145)
Total Bilirubin: 0.5 mg/dL (ref 0.3–1.2)
Total Protein: 7.7 g/dL (ref 6.5–8.1)

## 2018-06-01 LAB — MULTIPLE MYELOMA PANEL, SERUM
Albumin SerPl Elph-Mcnc: 3.5 g/dL (ref 2.9–4.4)
Albumin/Glob SerPl: 1.1 (ref 0.7–1.7)
Alpha 1: 0.2 g/dL (ref 0.0–0.4)
Alpha2 Glob SerPl Elph-Mcnc: 0.7 g/dL (ref 0.4–1.0)
B-Globulin SerPl Elph-Mcnc: 1 g/dL (ref 0.7–1.3)
Gamma Glob SerPl Elph-Mcnc: 1.6 g/dL (ref 0.4–1.8)
Globulin, Total: 3.5 g/dL (ref 2.2–3.9)
IgA: 94 mg/dL (ref 64–422)
IgG (Immunoglobin G), Serum: 1791 mg/dL — ABNORMAL HIGH (ref 586–1602)
IgM (Immunoglobulin M), Srm: 33 mg/dL (ref 26–217)
M Protein SerPl Elph-Mcnc: 1.3 g/dL — ABNORMAL HIGH
Total Protein ELP: 7 g/dL (ref 6.0–8.5)

## 2018-06-01 LAB — KAPPA/LAMBDA LIGHT CHAINS
Kappa free light chain: 91.5 mg/L — ABNORMAL HIGH (ref 3.3–19.4)
Kappa, lambda light chain ratio: 7.69 — ABNORMAL HIGH (ref 0.26–1.65)
Lambda free light chains: 11.9 mg/L (ref 5.7–26.3)

## 2018-06-05 ENCOUNTER — Telehealth: Payer: Self-pay | Admitting: Hematology and Oncology

## 2018-06-05 ENCOUNTER — Other Ambulatory Visit: Payer: Self-pay

## 2018-06-05 ENCOUNTER — Inpatient Hospital Stay: Payer: Medicare HMO | Attending: Hematology and Oncology | Admitting: Hematology and Oncology

## 2018-06-05 ENCOUNTER — Encounter: Payer: Self-pay | Admitting: Hematology and Oncology

## 2018-06-05 DIAGNOSIS — D472 Monoclonal gammopathy: Secondary | ICD-10-CM | POA: Diagnosis not present

## 2018-06-05 DIAGNOSIS — R7989 Other specified abnormal findings of blood chemistry: Secondary | ICD-10-CM | POA: Diagnosis not present

## 2018-06-05 DIAGNOSIS — R739 Hyperglycemia, unspecified: Secondary | ICD-10-CM | POA: Insufficient documentation

## 2018-06-05 NOTE — Assessment & Plan Note (Signed)
She is noted to have severe hyperglycemia, worrisome for undiagnosed diabetes I recommend an appointment to see her primary care doctor for further evaluation We discussed dietary modification and weight loss strategy

## 2018-06-05 NOTE — Progress Notes (Signed)
[facility] OFFICE PROGRESS NOTE  Patient Care Team: Hayden Rasmussen, MD as PCP - General (Family Medicine)  ASSESSMENT & PLAN:  MGUS (monoclonal gammopathy of unknown significance) Clinically, she has no signs of disease progression. Her M spike and IgG kappa levels are stable She has no signs of symptoms of end organ damage I will see her back in 12 months with repeat blood work to be done the week ahead of time so that we can have results before see her back.   Elevated serum creatinine We discussed the importance of blood pressure management and aggressive fluid hydration as tolerated  Hyperglycemia She is noted to have severe hyperglycemia, worrisome for undiagnosed diabetes I recommend an appointment to see her primary care doctor for further evaluation We discussed dietary modification and weight loss strategy   Orders Placed This Encounter  Procedures  . Comprehensive metabolic panel    Standing Status:   Future    Standing Expiration Date:   07/10/2019  . CBC with Differential/Platelet    Standing Status:   Future    Standing Expiration Date:   07/10/2019  . Kappa/lambda light chains    Standing Status:   Future    Standing Expiration Date:   07/10/2019  . Multiple Myeloma Panel (SPEP&IFE w/QIG)    Standing Status:   Future    Standing Expiration Date:   07/10/2019    INTERVAL HISTORY: Please see below for problem oriented charting. She returns for further follow-up Denies new bone pain No recent infection, fever or chills Appetite is stable She has gained some weight.  SUMMARY OF ONCOLOGIC HISTORY:  I reviewed the patient's records extensive and collaborated the history with the patient. Summary of her history is as follows: She has a long-standing history of multiple arthralgias, left CMC pain, OA of the bilateral knees, hands and feet and osteoporosis for which she is on Prolia previously. She had SPEP drawn on 02/19/2013 that revealed a restricted  band with monoclonal protein present, IgG kappa subtype. The monoclonal protein peak accounted for 1.38 g/dL of the total 1.66 g/dL of protein in the gamma region. Her CMP demonstrated a creatinine of 0.81 and calcium of 9.0 and total protein of 7.2 and albumin of 3.9; Her CBC was within normal limits with a hemoglobin of 12.5. Skeletal x-ray show no evidence of lytic lesions. She had a bone marrow biopsy on 06/22/2013 which show 10% of plasma cells. She was observed.  REVIEW OF SYSTEMS:   Constitutional: Denies fevers, chills or abnormal weight loss Eyes: Denies blurriness of vision Ears, nose, mouth, throat, and face: Denies mucositis or sore throat Respiratory: Denies cough, dyspnea or wheezes Cardiovascular: Denies palpitation, chest discomfort or lower extremity swelling Gastrointestinal:  Denies nausea, heartburn or change in bowel habits Skin: Denies abnormal skin rashes Lymphatics: Denies new lymphadenopathy or easy bruising Neurological:Denies numbness, tingling or new weaknesses Behavioral/Psych: Mood is stable, no new changes  All other systems were reviewed with the patient and are negative.  I have reviewed the past medical history, past surgical history, social history and family history with the patient and they are unchanged from previous note.  ALLERGIES:  is allergic to bee venom and penicillins.  MEDICATIONS:  Current Outpatient Medications  Medication Sig Dispense Refill  . albuterol (PROVENTIL HFA;VENTOLIN HFA) 108 (90 BASE) MCG/ACT inhaler Inhale 2 puffs into the lungs every 4 (four) hours as needed for wheezing or shortness of breath.     Marland Kitchen alendronate (FOSAMAX) 70 MG  tablet Take 70 mg by mouth every Wednesday.     . beclomethasone (QVAR) 80 MCG/ACT inhaler Inhale 2 puffs into the lungs 2 (two) times daily.    . Calcium Carb-Cholecalciferol (CALCIUM 600 + D PO) Take 600 mg by mouth daily.     . cetirizine (ZYRTEC) 10 MG tablet Take 10 mg by mouth daily.     .  cycloSPORINE (RESTASIS) 0.05 % ophthalmic emulsion Place 1 drop into both eyes at bedtime.     Marland Kitchen EPINEPHrine 0.3 mg/0.3 mL IJ SOAJ injection Use as directed for severe allergic reaction 2 Device 1  . esomeprazole (NEXIUM) 40 MG capsule Take 40 mg by mouth 2 (two) times daily.     Marland Kitchen ezetimibe (ZETIA) 10 MG tablet Take 1 tablet (10 mg total) by mouth daily. NEED OV. 90 tablet 0  . fluticasone (FLONASE) 50 MCG/ACT nasal spray USE 2 SPRAYS IN EACH NOSTRIL EVERY DAY 16 g 0  . ipratropium-albuterol (DUONEB) 0.5-2.5 (3) MG/3ML SOLN USE 1 VIAL IN NEBULIZER EVERY 4 TO 6 HOURS AS NEEDED FOR COUGHING WHEEZING SHORTNESS OF BREATH 180 mL 0  . levothyroxine (SYNTHROID, LEVOTHROID) 50 MCG tablet take 1 tablet by mouth once daily (Patient taking differently: take 1 tablet (50 mcg) by mouth once daily) 90 tablet 0  . metoprolol succinate (TOPROL-XL) 25 MG 24 hr tablet Take 25 mg by mouth daily.    . montelukast (SINGULAIR) 10 MG tablet Take 10 mg by mouth at bedtime.    Marland Kitchen PAZEO 0.7 % SOLN INSTILL 1 DROP IN BOTH EYES DAILY AS NEEDED 2.5 mL 0  . rosuvastatin (CRESTOR) 40 MG tablet take 1 tablet by mouth once daily 90 tablet 0   Current Facility-Administered Medications  Medication Dose Route Frequency Provider Last Rate Last Dose  . Benralizumab SOSY 30 mg  30 mg Subcutaneous Q8 Burna Forts, MD   30 mg at 05/11/18 1426    PHYSICAL EXAMINATION: ECOG PERFORMANCE STATUS: 0 - Asymptomatic  Vitals:   06/05/18 1215  BP: (!) 149/82  Pulse: 83  Resp: 18  Temp: 98 F (36.7 C)  SpO2: 98%   Filed Weights   06/05/18 1215  Weight: 205 lb 3.2 oz (93.1 kg)    GENERAL:alert, no distress and comfortable NEURO: alert & oriented x 3 with fluent speech, no focal motor/sensory deficits  LABORATORY DATA:  I have reviewed the data as listed    Component Value Date/Time   NA 139 05/29/2018 1141   NA 140 04/23/2016 1303   K 3.5 05/29/2018 1141   K 2.8 (LL) 04/23/2016 1303   CL 103 05/29/2018 1141   CO2 26  05/29/2018 1141   CO2 28 04/23/2016 1303   GLUCOSE 192 (H) 05/29/2018 1141   GLUCOSE 156 (H) 04/23/2016 1303   BUN 8 05/29/2018 1141   BUN 6.9 (L) 04/23/2016 1303   CREATININE 1.01 (H) 05/29/2018 1141   CREATININE 0.9 04/23/2016 1303   CALCIUM 9.1 05/29/2018 1141   CALCIUM 9.7 04/23/2016 1303   PROT 7.7 05/29/2018 1141   PROT 7.4 04/23/2016 1303   PROT 8.0 04/23/2016 1303   ALBUMIN 3.8 05/29/2018 1141   ALBUMIN 3.9 04/23/2016 1303   AST 24 05/29/2018 1141   AST 19 04/23/2016 1303   ALT 26 05/29/2018 1141   ALT 24 04/23/2016 1303   ALKPHOS 64 05/29/2018 1141   ALKPHOS 65 04/23/2016 1303   BILITOT 0.5 05/29/2018 1141   BILITOT 0.53 04/23/2016 1303   GFRNONAA 54 (L) 05/29/2018 1141  GFRAA >60 05/29/2018 1141    No results found for: SPEP, UPEP  Lab Results  Component Value Date   WBC 4.2 05/29/2018   NEUTROABS 2.4 05/29/2018   HGB 12.1 05/29/2018   HCT 37.2 05/29/2018   MCV 82.1 05/29/2018   PLT 200 05/29/2018      Chemistry      Component Value Date/Time   NA 139 05/29/2018 1141   NA 140 04/23/2016 1303   K 3.5 05/29/2018 1141   K 2.8 (LL) 04/23/2016 1303   CL 103 05/29/2018 1141   CO2 26 05/29/2018 1141   CO2 28 04/23/2016 1303   BUN 8 05/29/2018 1141   BUN 6.9 (L) 04/23/2016 1303   CREATININE 1.01 (H) 05/29/2018 1141   CREATININE 0.9 04/23/2016 1303      Component Value Date/Time   CALCIUM 9.1 05/29/2018 1141   CALCIUM 9.7 04/23/2016 1303   ALKPHOS 64 05/29/2018 1141   ALKPHOS 65 04/23/2016 1303   AST 24 05/29/2018 1141   AST 19 04/23/2016 1303   ALT 26 05/29/2018 1141   ALT 24 04/23/2016 1303   BILITOT 0.5 05/29/2018 1141   BILITOT 0.53 04/23/2016 1303      All questions were answered. The patient knows to call the clinic with any problems, questions or concerns. No barriers to learning was detected.  I spent 15 minutes counseling the patient face to face. The total time spent in the appointment was 20 minutes and more than 50% was on counseling  and review of test results  Heath Lark, MD 06/05/2018 1:01 PM

## 2018-06-05 NOTE — Telephone Encounter (Signed)
Scheduled appt per 5/1 sch message - sent reminder letter in the mail with appt date and time

## 2018-06-05 NOTE — Assessment & Plan Note (Signed)
We discussed the importance of blood pressure management and aggressive fluid hydration as tolerated

## 2018-06-05 NOTE — Assessment & Plan Note (Signed)
Clinically, she has no signs of disease progression. Her M spike and IgG kappa levels are stable She has no signs of symptoms of end organ damage I will see her back in 12 months with repeat blood work to be done the week ahead of time so that we can have results before see her back.

## 2018-06-19 ENCOUNTER — Telehealth: Payer: Self-pay | Admitting: *Deleted

## 2018-06-19 NOTE — Telephone Encounter (Signed)
Patient last received her allergy injections on 04/09/2018 and received .81mL for every 4 weeks. Patient also received her venom injections on 03/10/2018 at maintenance every 8 weeks. Patient would like to start back her injections. Please advise on the dosage that the patient should start back on with her allergy injections and venom injections.

## 2018-06-21 ENCOUNTER — Other Ambulatory Visit: Payer: Self-pay | Admitting: Allergy and Immunology

## 2018-06-21 DIAGNOSIS — J4550 Severe persistent asthma, uncomplicated: Secondary | ICD-10-CM

## 2018-06-21 DIAGNOSIS — I97131 Postprocedural heart failure following other surgery: Secondary | ICD-10-CM

## 2018-06-22 NOTE — Telephone Encounter (Signed)
Called and spoke with the patient's sister and she stated that Andrea Lowery is on her way to [facility] for her injection. Will advise patient when she comes into office regarding office appointment.

## 2018-06-22 NOTE — Telephone Encounter (Signed)
Last visit in clinic was over 1 year ago.  Please schedule OV.  Can restart allergen red 0.5 every 4 weeks and can restart venom full dose every 8 weeks.

## 2018-06-22 NOTE — Telephone Encounter (Signed)
Called and advised patient dosage for allergy and venom injections. A televisit has been scheduled with Dr. Neldon Mc. Patient verbalized understanding.

## 2018-06-23 ENCOUNTER — Ambulatory Visit (INDEPENDENT_AMBULATORY_CARE_PROVIDER_SITE_OTHER): Payer: Medicare HMO

## 2018-06-23 DIAGNOSIS — J309 Allergic rhinitis, unspecified: Secondary | ICD-10-CM | POA: Diagnosis not present

## 2018-06-30 ENCOUNTER — Ambulatory Visit (INDEPENDENT_AMBULATORY_CARE_PROVIDER_SITE_OTHER): Payer: Medicare HMO | Admitting: *Deleted

## 2018-06-30 DIAGNOSIS — T63441D Toxic effect of venom of bees, accidental (unintentional), subsequent encounter: Secondary | ICD-10-CM

## 2018-07-06 ENCOUNTER — Ambulatory Visit (INDEPENDENT_AMBULATORY_CARE_PROVIDER_SITE_OTHER): Payer: Medicare HMO

## 2018-07-06 ENCOUNTER — Other Ambulatory Visit: Payer: Self-pay

## 2018-07-06 DIAGNOSIS — J4550 Severe persistent asthma, uncomplicated: Secondary | ICD-10-CM

## 2018-07-07 ENCOUNTER — Ambulatory Visit (INDEPENDENT_AMBULATORY_CARE_PROVIDER_SITE_OTHER): Payer: Medicare HMO | Admitting: Allergy and Immunology

## 2018-07-07 ENCOUNTER — Encounter: Payer: Self-pay | Admitting: Allergy and Immunology

## 2018-07-07 DIAGNOSIS — T63441D Toxic effect of venom of bees, accidental (unintentional), subsequent encounter: Secondary | ICD-10-CM | POA: Diagnosis not present

## 2018-07-07 DIAGNOSIS — J4550 Severe persistent asthma, uncomplicated: Secondary | ICD-10-CM

## 2018-07-07 DIAGNOSIS — K219 Gastro-esophageal reflux disease without esophagitis: Secondary | ICD-10-CM

## 2018-07-07 DIAGNOSIS — J3089 Other allergic rhinitis: Secondary | ICD-10-CM | POA: Diagnosis not present

## 2018-07-07 MED ORDER — OLOPATADINE HCL 0.1 % OP SOLN: 1.0000 [drp] | Freq: Two times a day (BID) | OPHTHALMIC | 5 refills | Status: DC

## 2018-07-07 MED ORDER — ESOMEPRAZOLE MAGNESIUM 40 MG PO CPDR: 40.0000 mg | DELAYED_RELEASE_CAPSULE | Freq: Two times a day (BID) | ORAL | 1 refills | Status: DC

## 2018-07-07 MED ORDER — BECLOMETHASONE DIPROP HFA 80 MCG/ACT IN AERB: 2.0000 | INHALATION_SPRAY | Freq: Two times a day (BID) | RESPIRATORY_TRACT | 1 refills | Status: DC

## 2018-07-07 MED ORDER — MOMETASONE FURO-FORMOTEROL FUM 200-5 MCG/ACT IN AERO: 2.0000 | INHALATION_SPRAY | Freq: Two times a day (BID) | RESPIRATORY_TRACT | 5 refills | Status: DC

## 2018-07-07 MED ORDER — ALBUTEROL SULFATE HFA 108 (90 BASE) MCG/ACT IN AERS: 2.0000 | INHALATION_SPRAY | RESPIRATORY_TRACT | 1 refills | Status: DC | PRN

## 2018-07-07 NOTE — Progress Notes (Signed)
[facility]   Follow-up Note  Referring Provider: Hayden Rasmussen, MD Primary Provider: Hayden Rasmussen, MD Date of Office Visit: 07/07/2018  Subjective:   Andrea Lowery (DOB: 09-30-1940) is a 78 y.o. female who returns to the [facility] on 07/07/2018 in re-evaluation of the following:  HPI: This is a E-med visit requested by patient who is located at home.  Rosetta is followed in this clinic for severe asthma, allergic rhinitis, reflux, hymenoptera venom hypersensitivity state.  Her last visit to this clinic was 20 May 2017.  Started benralizumab 1 year ago. Doing well. No Dulera and no Qvar. SABA only 1 time per month. Had a systemic steroid in ER August 2019 secondary to URTI with asthma flare.  Otherwise no requirement for additional systemic steroid or antibiotics.  Nose doing well. Today some itching and burning of nose. Still use Flonase and singulair.  Reflux under control with PPI.  Restarted immunotherapy for hymenoptera allergy and aeroallergy last week. Discontinued secondary to coronavirus this spring.   Allergies as of 07/07/2018      Reactions   Bee Venom Anaphylaxis   Yellow jackets Yellow jackets   Penicillins Hives   Has patient had a PCN reaction causing immediate rash, facial/tongue/throat swelling, SOB or lightheadedness with hypotension: Yes Has patient had a PCN reaction causing severe rash involving mucus membranes or skin necrosis: No Has patient had a PCN reaction that required hospitalization No Has patient had a PCN reaction occurring within the last 10 years: No If all of the above answers are "NO", then may proceed with Cephalosporin use.      Medication List      albuterol 108 (90 Base) MCG/ACT inhaler Commonly known as:  VENTOLIN HFA Inhale 2 puffs into the lungs every 4 (four) hours as needed for wheezing or shortness of breath.   beclomethasone 80 MCG/ACT inhaler  Commonly known as:  QVAR Inhale 2 puffs into the lungs 2 (two) times daily.   cetirizine 10 MG tablet Commonly known as:  ZYRTEC Take 10 mg by mouth daily.   EPINEPHrine 0.3 mg/0.3 mL Soaj injection Commonly known as:  EPI-PEN Use as directed for severe allergic reaction   esomeprazole 40 MG capsule Commonly known as:  NEXIUM Take 40 mg by mouth 2 (two) times daily.   ezetimibe 10 MG tablet Commonly known as:  ZETIA Take 1 tablet (10 mg total) by mouth daily. NEED OV.   Fasenra 30 MG/ML Sosy Generic drug:  Benralizumab RX#2 INJECT 30MG  (1 SYRINGE) SUBCUTANEOUSLY AT WEEK 8 THEN EVERY 8 WEEKS THEREAFTER   fluticasone 50 MCG/ACT nasal spray Commonly known as:  FLONASE USE 2 SPRAYS IN EACH NOSTRIL EVERY DAY   Fosamax 70 MG tablet Generic drug:  alendronate Take 70 mg by mouth every Wednesday.   ipratropium-albuterol 0.5-2.5 (3) MG/3ML Soln Commonly known as:  DUONEB USE 1 VIAL IN NEBULIZER EVERY 4 TO 6 HOURS AS NEEDED FOR COUGHING WHEEZING SHORTNESS OF BREATH   levothyroxine 50 MCG tablet Commonly known as:  SYNTHROID take 1 tablet by mouth once daily   metoprolol succinate 25 MG 24 hr tablet Commonly known as:  TOPROL-XL Take 25 mg by mouth daily.   montelukast 10 MG tablet Commonly known as:  SINGULAIR Take 10 mg by mouth at bedtime.   Pazeo 0.7 % Soln Generic drug:  Olopatadine HCl INSTILL 1 DROP IN BOTH EYES DAILY AS NEEDED   rosuvastatin 40 MG  tablet Commonly known as:  CRESTOR take 1 tablet by mouth once daily       Past Medical History:  Diagnosis Date  . Arthritis   . Asthma   . Diabetes mellitus without complication ([place])    Diabetes II  . DVT (deep venous thrombosis) ([place])    "Years ago"  . Dyslipidemia   . GERD (gastroesophageal reflux disease)   . Headache    Migraine  . Hypertension   . Hypothyroidism   . LBBB (left bundle branch block)   . MGUS (monoclonal gammopathy of unknown significance)   . MGUS (monoclonal gammopathy of  unknown significance)   . Migraine   . OSA on CPAP     Past Surgical History:  Procedure Laterality Date  . ABDOMINAL HYSTERECTOMY  1969  . ARTERY BIOPSY N/A 11/07/2014   Procedure: BIOPSY TEMPORAL ARTERY;  Surgeon: Leta Baptist, MD;  Location: [facility];  Service: ENT;  Laterality: N/A;  . CARPAL TUNNEL RELEASE Right   . CHOLECYSTECTOMY  1976  . LUMBAR LAMINECTOMY/DECOMPRESSION MICRODISCECTOMY N/A 07/31/2015   Procedure: L4-5 Decompression, Possible Right L4-5 Microdiscectomy;  Surgeon: Marybelle Killings, MD;  Location: [facility];  Service: Orthopedics;  Laterality: N/A;  . NM MYOVIEW LTD  02/13/2010   No ischemia  . PLANTAR FASCIA RELEASE    . TONSILLECTOMY  1965  . US ECHOCARDIOGRAPHY  09/20/2008   borderline LVH,mild TR,AOV mildly sclerotic w/ca+ of the leaflets    Review of systems negative except as noted in HPI / PMHx or noted below:  Review of Systems  Constitutional: Negative.   HENT: Negative.   Eyes: Negative.   Respiratory: Negative.   Cardiovascular: Negative.   Gastrointestinal: Negative.   Genitourinary: Negative.   Musculoskeletal: Negative.   Skin: Negative.   Neurological: Negative.   Endo/Heme/Allergies: Negative.   Psychiatric/Behavioral: Negative.      Objective:   There were no vitals filed for this visit.        Physical Exam-deferred  Diagnostics: none  Assessment and Plan:   1. Asthma, severe persistent, well-controlled   2. Other allergic rhinitis   3. Gastroesophageal reflux disease, esophagitis presence not specified   4. Toxic effect of venom of bees, unintentional, subsequent encounter     1. Continue Fluticasone 1-2 sprays each nostril two times per day  2. Continue montelukast 10mg  one tablet one time per day  3. Continue esomeprazole 40mg  one tablet twice a day  4. Continue immunotherapy for venoms and aeroallergens   5.  Continue benralizumab injections every 8 weeks  6. If Needed:    A. nasal saline multiple times per day   B. OTC  mucinex DM twice a day   C. Cetirizine 10mg  one time per day  D. Proair HFA 2 inhalations or DUONEB every 4-6 hours  E. patanol - 1 drop each eye two times per day  7. Can restart Dulera 200 2 inhalations + Qvar 80 2 inhalations two times per day during asthma action plan  8. Return to clinic in 6 months or earlier if problem  9. Obtain fall flu vaccine  Julane has had an excellent response to the administration of benralizumab and she was able to discontinue all of her other controller agents for her asthma yet still requires some nasal steroid and montelukast for her upper airway issue.  She will maintain the therapy noted above which includes anti-inflammatory agents for airway including benralizumab and immunotherapy directed against both hymenoptera venom and aeroallergens and therapy directed against  reflux and I will see her back in this clinic in 6 months or earlier if there is a problem.  Allena Katz, MD Allergy / Immunology [facility]

## 2018-07-07 NOTE — Patient Instructions (Addendum)
  1. Continue Fluticasone 1-2 sprays each nostril two times per day  2. Continue montelukast 10mg  one tablet one time per day  3. Continue esomeprazole 40mg  one tablet twice a day  4. Continue immunotherapy for venoms and aeroallergens   5.  Continue benralizumab injections every 8 weeks  6. If Needed:    A. nasal saline multiple times per day   B. OTC mucinex DM twice a day   C. Cetirizine 10mg  one time per day  D. Proair HFA 2 inhalations or DUONEB every 4-6 hours  E. patanol - 1 drop each eye two times per day  7. Can restart Dulera 200 2 inhalations + Qvar 80 2 inhalations two times per day during asthma action plan  8. Return to clinic in 6 months or earlier if problem  9. Obtain fall flu vaccine

## 2018-07-08 ENCOUNTER — Encounter: Payer: Self-pay | Admitting: Allergy and Immunology

## 2018-07-10 NOTE — Progress Notes (Signed)
Patient is at home.  Provider is in office. Start Time: 459 pm End Time: 540 pm

## 2018-08-03 ENCOUNTER — Ambulatory Visit (INDEPENDENT_AMBULATORY_CARE_PROVIDER_SITE_OTHER): Payer: Medicare HMO | Admitting: *Deleted

## 2018-08-03 DIAGNOSIS — J309 Allergic rhinitis, unspecified: Secondary | ICD-10-CM

## 2018-08-04 ENCOUNTER — Telehealth: Payer: Self-pay | Admitting: *Deleted

## 2018-08-04 NOTE — Telephone Encounter (Signed)
Patient's last full dose venom was 03/10/2018. She received 0.50 of venom on 06/30/2018 with instructions to get other half the following week as she was unable to wait on 06/30/2018. Patient then became sick and never came back for other half. Would you like to do a split dose again or where should patient restart venom? Please advise. Patient is at every 8 weeks on venom.

## 2018-08-05 NOTE — Telephone Encounter (Signed)
Called patient and advised that she will receive .50 dose and wait 30 minutes and then wait the other 30 minutes and wait another 30 minutes and plan to be here for 1 hour. Patient verbalized understanding and has been placed on the schedule next Tuesday at 5:30 per [facility].

## 2018-08-05 NOTE — Telephone Encounter (Signed)
Give her the full dose with next injection.

## 2018-08-11 ENCOUNTER — Other Ambulatory Visit: Payer: Self-pay

## 2018-08-11 ENCOUNTER — Ambulatory Visit (INDEPENDENT_AMBULATORY_CARE_PROVIDER_SITE_OTHER): Payer: Medicare HMO | Admitting: *Deleted

## 2018-08-11 DIAGNOSIS — T63441D Toxic effect of venom of bees, accidental (unintentional), subsequent encounter: Secondary | ICD-10-CM | POA: Diagnosis not present

## 2018-08-31 ENCOUNTER — Ambulatory Visit (INDEPENDENT_AMBULATORY_CARE_PROVIDER_SITE_OTHER): Payer: Medicare HMO

## 2018-08-31 ENCOUNTER — Ambulatory Visit: Payer: Self-pay

## 2018-08-31 DIAGNOSIS — J4550 Severe persistent asthma, uncomplicated: Secondary | ICD-10-CM

## 2018-08-31 MED ORDER — EPINEPHRINE 0.3 MG/0.3ML IJ SOAJ: INTRAMUSCULAR | 1 refills | Status: DC

## 2018-09-03 ENCOUNTER — Ambulatory Visit (INDEPENDENT_AMBULATORY_CARE_PROVIDER_SITE_OTHER): Payer: Medicare HMO

## 2018-09-03 DIAGNOSIS — J309 Allergic rhinitis, unspecified: Secondary | ICD-10-CM | POA: Diagnosis not present

## 2018-09-21 ENCOUNTER — Other Ambulatory Visit: Payer: Self-pay

## 2018-09-21 ENCOUNTER — Other Ambulatory Visit: Payer: Self-pay | Admitting: Podiatry

## 2018-09-21 ENCOUNTER — Ambulatory Visit (INDEPENDENT_AMBULATORY_CARE_PROVIDER_SITE_OTHER): Payer: Medicare HMO

## 2018-09-21 ENCOUNTER — Ambulatory Visit (INDEPENDENT_AMBULATORY_CARE_PROVIDER_SITE_OTHER): Payer: Medicare HMO | Admitting: Podiatry

## 2018-09-21 DIAGNOSIS — R0989 Other specified symptoms and signs involving the circulatory and respiratory systems: Secondary | ICD-10-CM

## 2018-09-21 DIAGNOSIS — M7752 Other enthesopathy of left foot: Secondary | ICD-10-CM | POA: Diagnosis not present

## 2018-09-21 DIAGNOSIS — M779 Enthesopathy, unspecified: Secondary | ICD-10-CM

## 2018-09-21 DIAGNOSIS — M7751 Other enthesopathy of right foot: Secondary | ICD-10-CM

## 2018-09-21 DIAGNOSIS — M778 Other enthesopathies, not elsewhere classified: Secondary | ICD-10-CM

## 2018-09-21 DIAGNOSIS — M79672 Pain in left foot: Secondary | ICD-10-CM

## 2018-09-21 DIAGNOSIS — M79671 Pain in right foot: Secondary | ICD-10-CM

## 2018-09-27 NOTE — Progress Notes (Signed)
Subjective:   Patient ID: Andrea Lowery, female   DOB: 78 y.o.   MRN: [id number]   HPI 78 year old female presents the office today for concerns of bilateral ankle swelling as well as discomfort based on the past 1 year.  She describes the pain is sometimes dull sometimes sharp and intermittent.  She denies any recent trauma.  No redness or warmth.   Review of Systems  All other systems reviewed and are negative.  Past Medical History:  Diagnosis Date  . Arthritis   . Asthma   . Diabetes mellitus without complication ([place])    Diabetes II  . DVT (deep venous thrombosis) ([place])    "Years ago"  . Dyslipidemia   . GERD (gastroesophageal reflux disease)   . Headache    Migraine  . Hypertension   . Hypothyroidism   . LBBB (left bundle branch block)   . MGUS (monoclonal gammopathy of unknown significance)   . MGUS (monoclonal gammopathy of unknown significance)   . Migraine   . OSA on CPAP     Past Surgical History:  Procedure Laterality Date  . ABDOMINAL HYSTERECTOMY  1969  . ARTERY BIOPSY N/A 11/07/2014   Procedure: BIOPSY TEMPORAL ARTERY;  Surgeon: Leta Baptist, MD;  Location: [facility];  Service: ENT;  Laterality: N/A;  . CARPAL TUNNEL RELEASE Right   . CHOLECYSTECTOMY  1976  . LUMBAR LAMINECTOMY/DECOMPRESSION MICRODISCECTOMY N/A 07/31/2015   Procedure: L4-5 Decompression, Possible Right L4-5 Microdiscectomy;  Surgeon: Marybelle Killings, MD;  Location: [facility];  Service: Orthopedics;  Laterality: N/A;  . NM MYOVIEW LTD  02/13/2010   No ischemia  . PLANTAR FASCIA RELEASE    . TONSILLECTOMY  1965  . US ECHOCARDIOGRAPHY  09/20/2008   borderline LVH,mild TR,AOV mildly sclerotic w/ca+ of the leaflets     Current Outpatient Medications:  .  acetaminophen-codeine (TYLENOL #3) 300-30 MG tablet, take 1 tablet by mouth twice a day if needed, Disp: , Rfl:  .  albuterol (VENTOLIN HFA) 108 (90 Base) MCG/ACT inhaler, Inhale 2 puffs into the lungs every 4 (four) hours as needed for wheezing or  shortness of breath., Disp: 1 Inhaler, Rfl: 1 .  alendronate (FOSAMAX) 70 MG tablet, Take 70 mg by mouth every Wednesday. , Disp: , Rfl:  .  ALPRAZolam (XANAX) 0.25 MG tablet, TK 1 T PO D PRN, Disp: , Rfl:  .  beclomethasone (QVAR REDIHALER) 80 MCG/ACT inhaler, Inhale 2 puffs into the lungs 2 (two) times daily., Disp: 3 Inhaler, Rfl: 1 .  beclomethasone (QVAR) 80 MCG/ACT inhaler, Inhale 2 puffs into the lungs 2 (two) times daily., Disp: , Rfl:  .  Blood Glucose Monitoring Suppl (ACCU-CHEK AVIVA PLUS) w/Device KIT, by Does not apply route., Disp: , Rfl:  .  cetirizine (ZYRTEC) 10 MG tablet, Take 10 mg by mouth daily. , Disp: , Rfl:  .  Continuous Blood Gluc Sensor (FREESTYLE LIBRE 14 DAY SENSOR) MISC, U AS DIRECTED, Disp: , Rfl:  .  cycloSPORINE (RESTASIS) 0.05 % ophthalmic emulsion, Place one drop into both eyes 2 (two) times daily., Disp: , Rfl:  .  diflorasone (PSORCON) 0.05 % ointment, , Disp: , Rfl:  .  EPINEPHrine 0.3 mg/0.3 mL IJ SOAJ injection, Use as directed for severe allergic reaction, Disp: 2 each, Rfl: 1 .  esomeprazole (NEXIUM) 40 MG capsule, Take 1 capsule (40 mg total) by mouth 2 (two) times daily., Disp: 180 capsule, Rfl: 1 .  ezetimibe (ZETIA) 10 MG tablet, Take 1 tablet (10 mg total)  by mouth daily. NEED OV., Disp: 90 tablet, Rfl: 0 .  FASENRA 30 MG/ML SOSY, RX#2 INJECT 30MG (1 SYRINGE) SUBCUTANEOUSLY AT WEEK 8 THEN EVERY 8 WEEKS THEREAFTER, Disp: 1 mL, Rfl: 8 .  fluconazole (DIFLUCAN) 150 MG tablet, TK 1 T PO NOW AND OK TO  REPEAT ON 3 AND 5 DAYS, Disp: , Rfl:  .  fluticasone (FLONASE) 50 MCG/ACT nasal spray, USE 2 SPRAYS IN EACH NOSTRIL EVERY DAY, Disp: 16 g, Rfl: 0 .  gabapentin (NEURONTIN) 100 MG capsule, TK 1 C PO HS X 3 NIGHTS THEN 2 C HS X 3 NIGHTS THEN 3 C HS, Disp: , Rfl:  .  ipratropium-albuterol (DUONEB) 0.5-2.5 (3) MG/3ML SOLN, USE 1 VIAL IN NEBULIZER EVERY 4 TO 6 HOURS AS NEEDED FOR COUGHING WHEEZING SHORTNESS OF BREATH, Disp: 180 mL, Rfl: 0 .  ketoconazole (NIZORAL)  2 % shampoo, APP EXT 2 TIMES A WK, Disp: , Rfl:  .  levothyroxine (SYNTHROID, LEVOTHROID) 50 MCG tablet, take 1 tablet by mouth once daily (Patient taking differently: take 1 tablet (50 mcg) by mouth once daily), Disp: 90 tablet, Rfl: 0 .  meloxicam (MOBIC) 7.5 MG tablet, Take by mouth., Disp: , Rfl:  .  metoprolol succinate (TOPROL-XL) 25 MG 24 hr tablet, Take 25 mg by mouth daily., Disp: , Rfl:  .  mometasone-formoterol (DULERA) 200-5 MCG/ACT AERO, Inhale 2 puffs into the lungs 2 (two) times daily., Disp: 1 Inhaler, Rfl: 5 .  montelukast (SINGULAIR) 10 MG tablet, Take 10 mg by mouth at bedtime., Disp: , Rfl:  .  olmesartan-hydrochlorothiazide (BENICAR HCT) 40-12.5 MG tablet, TK 1 T PO D, Disp: , Rfl:  .  olopatadine (PATANOL) 0.1 % ophthalmic solution, Place 1 drop into both eyes 2 (two) times daily., Disp: 5 mL, Rfl: 5 .  Potassium Chloride ER 20 MEQ TBCR, , Disp: , Rfl:  .  potassium chloride SA (K-DUR) 20 MEQ tablet, take 1 tablet by mouth once daily, Disp: , Rfl:  .  rosuvastatin (CRESTOR) 40 MG tablet, take 1 tablet by mouth once daily, Disp: 90 tablet, Rfl: 0 .  sertraline (ZOLOFT) 25 MG tablet, , Disp: , Rfl:  .  topiramate (TOPAMAX) 50 MG tablet, TAKE 1/2 TABLET BY MOUTH DAILY THEN IN 1 WEEK INCREASE TO 1 DAILY, Disp: , Rfl:   Current Facility-Administered Medications:  .  Benralizumab SOSY 30 mg, 30 mg, Subcutaneous, Q8 Weeks, Kozlow, Donnamarie Poag, MD, 30 mg at 08/31/18 1157  Allergies  Allergen Reactions  . Bee Venom Anaphylaxis    Yellow jackets Yellow jackets  . Penicillins Hives    Has patient had a PCN reaction causing immediate rash, facial/tongue/throat swelling, SOB or lightheadedness with hypotension: Yes Has patient had a PCN reaction causing severe rash involving mucus membranes or skin necrosis: No Has patient had a PCN reaction that required hospitalization No Has patient had a PCN reaction occurring within the last 10 years: No If all of the above answers are "NO", then  may proceed with Cephalosporin use.        Objective:  Physical Exam  General: AAO x3, NAD  Dermatological: Skin is warm, dry and supple bilateral. Nails x 10 are well manicured; remaining integument appears unremarkable at this time. There are no open sores, no preulcerative lesions, no rash or signs of infection present.  Vascular: DP, PT pulses decreased bilaterally.  Edema present.  Significant calf compression, swelling, warmth, erythema.  Neruologic: Grossly intact via light touch bilateral. Protective threshold with Semmes Wienstein monofilament  intact to all pedal Lowery bilateral.  Musculoskeletal: There is tenderness palpation to the plantar medial tubercle of the navicular insertion of plantar fascia bilaterally as well as mildly to the ankle joint.  There is chronic edema present bilaterally there is no erythema or warmth.  There is no fluctuation crepitation.  shoulder range of motion intact. muscular strength 5/5 in all groups tested bilateral.  Gait: Unassisted, Nonantalgic.       Assessment:   Bilateral ankle swelling, tendonitis     Plan:  -Treatment options discussed including all alternatives, risks, and complications -Etiology of symptoms were discussed -X-rays were obtained and reviewed with the patient. No evidence of acute fracture or stress fracture. -Bilateral steroid injection performed.  Mixture 1 cc Kenalog 10, 0.5 cc Marcaine plain and 0.5 cc of lidocaine plain was infiltrated into the area of maximal tenderness without complications.  Postinjection care discussed.  Discussed traction complaint today.  Discussion of patient.  An ABI was performed in the office to ensure adequate circulation.  ABI on the left was 1.05 on the right was 1.07. -Continue to follow cardiology/PCP as well in regards to swelling.  Trula Slade DPM

## 2018-10-06 ENCOUNTER — Ambulatory Visit (INDEPENDENT_AMBULATORY_CARE_PROVIDER_SITE_OTHER): Payer: Medicare HMO | Admitting: *Deleted

## 2018-10-06 DIAGNOSIS — T63441D Toxic effect of venom of bees, accidental (unintentional), subsequent encounter: Secondary | ICD-10-CM | POA: Diagnosis not present

## 2018-10-13 ENCOUNTER — Encounter: Payer: Self-pay | Admitting: Orthopaedic Surgery

## 2018-10-13 ENCOUNTER — Ambulatory Visit (INDEPENDENT_AMBULATORY_CARE_PROVIDER_SITE_OTHER): Payer: Medicare HMO | Admitting: Orthopaedic Surgery

## 2018-10-13 VITALS — BP 172/83 | HR 61 | Ht 65.0 in | Wt 200.0 lb

## 2018-10-13 DIAGNOSIS — R600 Localized edema: Secondary | ICD-10-CM

## 2018-10-13 NOTE — Progress Notes (Signed)
Office Visit Note   Patient: Andrea Lowery           Date of Birth: [birth year]           MRN: [id number] Visit Date: 10/13/2018              Requested by: Hayden Rasmussen, MD [address] [place],  [place] [address] PCP: Hayden Rasmussen, MD   Assessment & Plan: Visit Diagnoses:  1. Bilateral leg edema     Plan: Knee-high teds were supplied.  We will see how she does with this.  We discussed it could be related to meloxicam, could be related to heart failure.  Does not appear to be related to any of her other medications.  She can follow-up with her PCP for additional recommendations.  Follow-Up Instructions: No follow-ups on file.   Orders:  No orders of the defined types were placed in this encounter.  No orders of the defined types were placed in this encounter.     Procedures: No procedures performed   Clinical Data: No additional findings.   Subjective: Chief Complaint  Patient presents with  . Left Knee - Pain  . Right Ankle - Pain  . Left Ankle - Pain    HPI 78 year old female seen with problems with bilateral leg swelling and edema with mild pitting.  She has some aching at the end of the day swelling is worse as the day progresses.  Previous microdiscectomy L4-5 by me 2017 and June is done well with good relief of back pain since that time she denies numbness or tingling no gait disturbance.  We reviewed her medicine list she does not appear to be on any medications that would cause peripheral edema.  She does take meloxicam but it is only 7.5 mg daily.  She has had some compression socks that she bought someplace.  They helped to some degree.  No venous stasis ulcerations.  Past history of a DVT left leg and she may have slightly more swelling in the left lower extremity than right.  Review of Systems positive for type 2 diabetes history of asthma, anxiety, history of class II diastolic heart failure.  Previous lumbar surgery L4-5 2017 doing  well.  Bilateral lower extremity swelling.  Otherwise negative as pertains HPI.   Objective: Vital Signs: BP (!) 172/83   Pulse 61   Ht 5\' 5"  (1.651 m)   Wt 200 lb (90.7 kg)   BMI 33.28 kg/m   Physical Exam Constitutional:      Appearance: She is well-developed.  HENT:     Head: Normocephalic.     Right Ear: External ear normal.     Left Ear: External ear normal.  Eyes:     Pupils: Pupils are equal, round, and reactive to light.  Neck:     Thyroid: No thyromegaly.     Trachea: No tracheal deviation.  Cardiovascular:     Rate and Rhythm: Normal rate.  Pulmonary:     Effort: Pulmonary effort is normal.  Abdominal:     Palpations: Abdomen is soft.  Skin:    General: Skin is warm and dry.  Neurological:     Mental Status: She is alert and oriented to person, place, and time.  Psychiatric:        Behavior: Behavior normal.     Ortho Exam patient has well-healed lumbar incision negative logroll to the hips negative straight leg raising 90 degrees negative popliteal compression test.  Minimal crepitus with knee extension.  Knees reach full extension good flexion.  Bilateral pitting edema with tenderness without ulceration distal pulses are palpable.  No plantar foot lesions.  Specialty Comments:  No specialty comments available.  Imaging: No results found.   PMFS History: Patient Active Problem List   Diagnosis Date Noted  . Elevated serum creatinine 06/05/2018  . Hyperglycemia 06/05/2018  . Arthralgia 08/02/2016  . Eczema 08/02/2016  . Hypokalemia 04/30/2016  . Impingement syndrome of left shoulder 04/05/2016  . Carotid artery disease ([place]) 01/19/2016  . Dyslipidemia 01/19/2016  . Acute severe exacerbation of asthma 01/10/2016  . 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 Noncompliance 11/20/2015  . Seborrheic dermatitis of scalp 11/20/2015  . History of lumbar laminectomy for spinal cord decompression 07/31/2015  .  Candidal skin infection 06/14/2015  . Osteoporosis without current pathological fracture 05/30/2015  . Chronic pain of right hip 05/02/2015  . Chronic right-sided low back pain with right-sided sciatica 03/31/2015  . Temporal arteritis ([place]) 11/07/2014  . Headache 11/07/2014  . Diabetes mellitus type 2, controlled ([place]) 11/07/2014  . Diastolic CHF ([place]) [id number]  . Sacroiliac dysfunction 11/04/2014  . Trochanteric bursitis of right hip 11/04/2014  . Asthma 10/05/2014  . GERD (gastroesophageal reflux disease) 10/05/2014  . Allergic rhinitis 10/05/2014  . Hypothyroidism 09/21/2014  . Multinodular goiter 08/09/2014  . Disorder of sacroiliac joint 07/29/2014  . 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 Thyroid nodule 07/01/2014  . Hypersomnolence 04/16/2014  . Snoring 04/16/2014  . Anxiety 03/18/2014  . Preventive measure 01/25/2014  . Difficulty sleeping 12/28/2013  . Hyperlipidemia 12/27/2013  . Acute diastolic CHF (congestive heart failure), NYHA class 2 ([place]) 07/29/2013  . Bilateral leg edema 07/20/2013  . Edema 07/20/2013  . 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 Essential hypertension 07/01/2012  . LBBB (left bundle branch block) 07/01/2012  . DOE (dyspnea on exertion) 07/01/2012  . OSA on CPAP 07/01/2012  . Abnormal respiratory rate 07/01/2012   Past Medical History:  Diagnosis Date  . Arthritis   . Asthma   . Diabetes mellitus without complication ([place])    Diabetes II  . DVT (deep venous thrombosis) ([place])    "Years ago"  . Dyslipidemia   . GERD (gastroesophageal reflux disease)   . Headache    Migraine  . Hypertension   . Hypothyroidism   . LBBB (left bundle branch block)   . MGUS (monoclonal gammopathy of unknown significance)   . MGUS (monoclonal gammopathy of unknown significance)   . Migraine   . OSA on CPAP     Family History  Problem Relation Age of Onset  . Diabetes Father   . Heart  failure Father   . Hypertension Father   . Thyroid disease Sister   . Hypertension Sister     Past Surgical History:  Procedure Laterality Date  . ABDOMINAL HYSTERECTOMY  1969  . ARTERY BIOPSY N/A 11/07/2014   Procedure: BIOPSY TEMPORAL ARTERY;  Surgeon: Leta Baptist, MD;  Location: [facility];  Service: ENT;  Laterality: N/A;  . CARPAL TUNNEL RELEASE Right   . CHOLECYSTECTOMY  1976  . LUMBAR LAMINECTOMY/DECOMPRESSION MICRODISCECTOMY N/A 07/31/2015   Procedure: L4-5 Decompression, Possible Right L4-5 Microdiscectomy;  Surgeon: Marybelle Killings, MD;  Location: [facility];  Service: Orthopedics;  Laterality: N/A;  . NM MYOVIEW LTD  02/13/2010   No ischemia  . PLANTAR FASCIA RELEASE    . TONSILLECTOMY  1965  .  US ECHOCARDIOGRAPHY  09/20/2008   borderline LVH,mild TR,AOV mildly sclerotic w/ca+ of the leaflets   Social History   Occupational History  . Not on file  Tobacco Use  . Smoking status: Former Smoker    Quit date: 02/04/2006    Years since quitting: 12.6  . Smokeless tobacco: Never Used  Substance and Sexual Activity  . Alcohol use: No    Alcohol/week: 0.0 standard drinks  . Drug use: No  . Sexual activity: Not on file

## 2018-10-26 ENCOUNTER — Ambulatory Visit (INDEPENDENT_AMBULATORY_CARE_PROVIDER_SITE_OTHER): Payer: Medicare HMO

## 2018-10-26 ENCOUNTER — Other Ambulatory Visit: Payer: Self-pay

## 2018-10-26 DIAGNOSIS — J4550 Severe persistent asthma, uncomplicated: Secondary | ICD-10-CM | POA: Diagnosis not present

## 2018-11-10 ENCOUNTER — Ambulatory Visit (INDEPENDENT_AMBULATORY_CARE_PROVIDER_SITE_OTHER): Payer: Medicare HMO | Admitting: Allergy and Immunology

## 2018-11-10 ENCOUNTER — Other Ambulatory Visit: Payer: Self-pay

## 2018-11-10 ENCOUNTER — Encounter: Payer: Medicare HMO | Attending: Family Medicine

## 2018-11-10 ENCOUNTER — Encounter: Payer: Self-pay | Admitting: Allergy and Immunology

## 2018-11-10 VITALS — BP 142/76 | HR 76 | Temp 97.6°F | Resp 18

## 2018-11-10 DIAGNOSIS — T63441D Toxic effect of venom of bees, accidental (unintentional), subsequent encounter: Secondary | ICD-10-CM

## 2018-11-10 DIAGNOSIS — J3089 Other allergic rhinitis: Secondary | ICD-10-CM

## 2018-11-10 DIAGNOSIS — J4550 Severe persistent asthma, uncomplicated: Secondary | ICD-10-CM

## 2018-11-10 DIAGNOSIS — K219 Gastro-esophageal reflux disease without esophagitis: Secondary | ICD-10-CM | POA: Diagnosis not present

## 2018-11-10 MED ORDER — MONTELUKAST SODIUM 10 MG PO TABS: 10.0000 mg | ORAL_TABLET | Freq: Every day | ORAL | 1 refills | Status: DC

## 2018-11-10 MED ORDER — ALBUTEROL SULFATE HFA 108 (90 BASE) MCG/ACT IN AERS: 2.0000 | INHALATION_SPRAY | RESPIRATORY_TRACT | 1 refills | Status: DC | PRN

## 2018-11-10 MED ORDER — DULERA 200-5 MCG/ACT IN AERO: 2.0000 | INHALATION_SPRAY | Freq: Two times a day (BID) | RESPIRATORY_TRACT | 1 refills | Status: DC

## 2018-11-10 MED ORDER — OLOPATADINE HCL 0.1 % OP SOLN: 1.0000 [drp] | Freq: Two times a day (BID) | OPHTHALMIC | 1 refills | Status: DC

## 2018-11-10 MED ORDER — CETIRIZINE HCL 10 MG PO TABS: 10.0000 mg | ORAL_TABLET | Freq: Every day | ORAL | 1 refills | Status: DC

## 2018-11-10 MED ORDER — QVAR REDIHALER 80 MCG/ACT IN AERB: 2.0000 | INHALATION_SPRAY | Freq: Two times a day (BID) | RESPIRATORY_TRACT | 1 refills | Status: DC

## 2018-11-10 MED ORDER — ESOMEPRAZOLE MAGNESIUM 40 MG PO CPDR: 40.0000 mg | DELAYED_RELEASE_CAPSULE | Freq: Two times a day (BID) | ORAL | 1 refills | Status: DC

## 2018-11-10 MED ORDER — PAZEO 0.7 % OP SOLN: 1.0000 [drp] | OPHTHALMIC | 5 refills | Status: DC

## 2018-11-10 MED ORDER — BUDESONIDE-FORMOTEROL FUMARATE 160-4.5 MCG/ACT IN AERO: INHALATION_SPRAY | RESPIRATORY_TRACT | 5 refills | Status: DC

## 2018-11-10 MED ORDER — PATANOL 0.1 % OP SOLN: 1.0000 [drp] | Freq: Two times a day (BID) | OPHTHALMIC | 5 refills | Status: DC

## 2018-11-10 MED ORDER — FLUTICASONE PROPIONATE 50 MCG/ACT NA SUSP: 2.0000 | Freq: Every day | NASAL | 1 refills | Status: DC

## 2018-11-10 NOTE — Progress Notes (Signed)
[place]   Follow-up Note  Referring Provider: Hayden Rasmussen, MD Primary Provider: Hayden Rasmussen, MD Date of Office Visit: 11/10/2018  Subjective:   Andrea Lowery (DOB: Feb 16, 1940) is a 78 y.o. female who returns to the [facility] on 11/10/2018 in re-evaluation of the following:  HPI: Andrea Lowery returns to this clinic in reevaluation of asthma and allergic rhinitis and reflux and hymenoptera venom hypersensitivity state.  Her last contact in this clinic was an E - med visit on 07 July 2018.  She believes that her airway is doing quite well while using her benralizumab injections.  She does not use any Dulera and does not use any Qvar and rarely uses a short acting bronchodilator usually about 1 time per month.  She has not required a systemic steroid or an antibiotic for any type of airway issue.  She still continues to use Flonase for her nasal issue which works pretty well although she does have a little bit more congestion on the left side than the right side.  Her reflux is under very good control at this point in time on her current therapy of a proton pump inhibitor twice a day.  She has been receiving benralizumab injections and has been receiving hymenoptera venom injections.  Because of the coronavirus pandemic she has not been as consistent about using aero allergen injections.  She did receive the flu vaccine this year.  Allergies as of 11/10/2018      Reactions   Bee Venom Anaphylaxis   Yellow jackets Yellow jackets   Penicillins Hives   Has patient had a PCN reaction causing immediate rash, facial/tongue/throat swelling, SOB or lightheadedness with hypotension: Yes Has patient had a PCN reaction causing severe rash involving mucus membranes or skin necrosis: No Has patient had a PCN reaction that required hospitalization No Has patient had a PCN reaction occurring within the last 10 years: No If  all of the above answers are "NO", then may proceed with Cephalosporin use.      Medication List    Accu-Chek Aviva Plus w/Device Kit by Does not apply route.   albuterol 108 (90 Base) MCG/ACT inhaler Commonly known as: VENTOLIN HFA Inhale 2 puffs into the lungs every 4 (four) hours as needed for wheezing or shortness of breath.   ALPRAZolam 0.25 MG tablet Commonly known as: XANAX TK 1 T PO D PRN   beclomethasone 80 MCG/ACT inhaler Commonly known as: QVAR Inhale 2 puffs into the lungs 2 (two) times daily.   beclomethasone 80 MCG/ACT inhaler Commonly known as: Qvar RediHaler Inhale 2 puffs into the lungs 2 (two) times daily.   cetirizine 10 MG tablet Commonly known as: ZYRTEC Take 10 mg by mouth daily.   cycloSPORINE 0.05 % ophthalmic emulsion Commonly known as: RESTASIS Place one drop into both eyes 2 (two) times daily.   diflorasone 0.05 % ointment Commonly known as: PSORCON   EPINEPHrine 0.3 mg/0.3 mL Soaj injection Commonly known as: EPI-PEN Use as directed for severe allergic reaction   esomeprazole 40 MG capsule Commonly known as: NEXIUM Take 1 capsule (40 mg total) by mouth 2 (two) times daily.   Fasenra 30 MG/ML Sosy Generic drug: Benralizumab RX#2 INJECT 30MG (1 SYRINGE) SUBCUTANEOUSLY AT WEEK 8 THEN EVERY 8 WEEKS THEREAFTER   fluticasone 50 MCG/ACT nasal spray Commonly known as: FLONASE USE 2 SPRAYS IN EACH NOSTRIL EVERY DAY   Fosamax 70 MG tablet Generic drug:  alendronate Take 70 mg by mouth every Wednesday.   FreeStyle Libre 14 Day Sensor Misc U AS DIRECTED   gabapentin 100 MG capsule Commonly known as: NEURONTIN TK 1 C PO HS X 3 NIGHTS THEN 2 C HS X 3 NIGHTS THEN 3 C HS   ipratropium-albuterol 0.5-2.5 (3) MG/3ML Soln Commonly known as: DUONEB USE 1 VIAL IN NEBULIZER EVERY 4 TO 6 HOURS AS NEEDED FOR COUGHING WHEEZING SHORTNESS OF BREATH   Jardiance 25 MG Tabs tablet Generic drug: empagliflozin   ketoconazole 2 % shampoo Commonly known  as: NIZORAL APP EXT 2 TIMES A WK   levothyroxine 50 MCG tablet Commonly known as: SYNTHROID take 1 tablet by mouth once daily   meloxicam 7.5 MG tablet Commonly known as: MOBIC Take by mouth.   metoprolol succinate 25 MG 24 hr tablet Commonly known as: TOPROL-XL Take 25 mg by mouth daily.   mometasone-formoterol 200-5 MCG/ACT Aero Commonly known as: Dulera Inhale 2 puffs into the lungs 2 (two) times daily.   montelukast 10 MG tablet Commonly known as: SINGULAIR Take 10 mg by mouth at bedtime.   olmesartan-hydrochlorothiazide 40-12.5 MG tablet Commonly known as: BENICAR HCT TK 1 T PO D   olopatadine 0.1 % ophthalmic solution Commonly known as: PATANOL Place 1 drop into both eyes 2 (two) times daily.   Potassium Chloride ER 20 MEQ Tbcr   potassium chloride SA 20 MEQ tablet Commonly known as: KLOR-CON take 1 tablet by mouth once daily   rosuvastatin 40 MG tablet Commonly known as: CRESTOR take 1 tablet by mouth once daily   sertraline 25 MG tablet Commonly known as: ZOLOFT   topiramate 50 MG tablet Commonly known as: TOPAMAX TAKE 1/2 TABLET BY MOUTH DAILY THEN IN 1 WEEK INCREASE TO 1 DAILY       Past Medical History:  Diagnosis Date  . Arthritis   . Asthma   . Diabetes mellitus without complication ([place])    Diabetes II  . DVT (deep venous thrombosis) ([place])    "Years ago"  . Dyslipidemia   . GERD (gastroesophageal reflux disease)   . Headache    Migraine  . Hypertension   . Hypothyroidism   . LBBB (left bundle branch block)   . MGUS (monoclonal gammopathy of unknown significance)   . MGUS (monoclonal gammopathy of unknown significance)   . Migraine   . OSA on CPAP     Past Surgical History:  Procedure Laterality Date  . ABDOMINAL HYSTERECTOMY  1969  . ARTERY BIOPSY N/A 11/07/2014   Procedure: BIOPSY TEMPORAL ARTERY;  Surgeon: Leta Baptist, MD;  Location: [facility];  Service: ENT;  Laterality: N/A;  . CARPAL TUNNEL RELEASE Right   . CHOLECYSTECTOMY  1976   . LUMBAR LAMINECTOMY/DECOMPRESSION MICRODISCECTOMY N/A 07/31/2015   Procedure: L4-5 Decompression, Possible Right L4-5 Microdiscectomy;  Surgeon: Marybelle Killings, MD;  Location: [facility];  Service: Orthopedics;  Laterality: N/A;  . NM MYOVIEW LTD  02/13/2010   No ischemia  . PLANTAR FASCIA RELEASE    . TONSILLECTOMY  1965  . US ECHOCARDIOGRAPHY  09/20/2008   borderline LVH,mild TR,AOV mildly sclerotic w/ca+ of the leaflets    Review of systems negative except as noted in HPI / PMHx or noted below:  Review of Systems  Constitutional: Negative.   HENT: Negative.   Eyes: Negative.   Respiratory: Negative.   Cardiovascular: Negative.   Gastrointestinal: Negative.   Genitourinary: Negative.   Musculoskeletal: Negative.   Skin: Negative.   Neurological: Negative.  Endo/Heme/Allergies: Negative.   Psychiatric/Behavioral: Negative.      Objective:   Vitals:   11/10/18 1102  BP: (!) 142/76  Pulse: 76  Resp: 18  Temp: 97.6 F (36.4 C)  SpO2: 97%          Physical Exam Constitutional:      Appearance: She is not diaphoretic.  HENT:     Head: Normocephalic.     Right Ear: Tympanic membrane, ear canal and external ear normal.     Left Ear: Tympanic membrane, ear canal and external ear normal.     Nose: Nose normal. No mucosal edema or rhinorrhea.     Mouth/Throat:     Pharynx: Uvula midline. No oropharyngeal exudate.  Eyes:     Conjunctiva/sclera: Conjunctivae normal.  Neck:     Thyroid: No thyromegaly.     Trachea: Trachea normal. No tracheal tenderness or tracheal deviation.  Cardiovascular:     Rate and Rhythm: Normal rate and regular rhythm.     Heart sounds: Normal heart sounds, S1 normal and S2 normal. No murmur.  Pulmonary:     Effort: No respiratory distress.     Breath sounds: Normal breath sounds. No stridor. No wheezing or rales.  Lymphadenopathy:     Head:     Right side of head: No tonsillar adenopathy.     Left side of head: No tonsillar adenopathy.      Cervical: No cervical adenopathy.  Skin:    Findings: No erythema or rash.     Nails: There is no clubbing.   Neurological:     Mental Status: She is alert.     Diagnostics:    Spirometry was performed and demonstrated an FEV1 of 1.43 at 81 % of predicted.  Assessment and Plan:   1. Asthma, severe persistent, well-controlled   2. Other allergic rhinitis   3. Toxic effect of venom of bees, unintentional, subsequent encounter   4. Gastroesophageal reflux disease, unspecified whether esophagitis present     1. Continue Fluticasone 1-2 sprays each nostril two times per day  2. Continue montelukast 73m one tablet one time per day  3. Continue esomeprazole 467mone tablet twice a day  4. Continue immunotherapy for venoms    5.  Continue benralizumab injections every 8 weeks  6. Restart aeroallergen immunotherapy  7. If Needed:     A. nasal saline multiple times per day   B. OTC mucinex DM twice a day   C. Cetirizine 1033mne time per day  D. Proair HFA 2 inhalations or DUONEB every 4-6 hours  E. patanol - 1 drop each eye two times per day  8. Can restart Dulera 200 2 inhalations + Qvar 80 2 inhalations two times per day during "asthma flare - up"  9. Return to clinic in 6 months or earlier if problem  9. Obtain COVID vaccine when available  PatStellarpears to be doing pretty good ever since she started her benralizumab injections regarding her severe asthma.  She will continue on some anti-inflammatory agents for her airway as noted above and she can always activate her "action plan" whenever she develops an asthma flareup as noted above.  She will continue on hymenoptera venom injections as well and we will restart her on aero allergen immunotherapy.  I will see her back in this clinic in 6 months or earlier if there is a problem.  EriAllena KatzD [facility]

## 2018-11-10 NOTE — Patient Instructions (Addendum)
  1. Continue Fluticasone 1-2 sprays each nostril two times per day  2. Continue montelukast 10mg  one tablet one time per day  3. Continue esomeprazole 40mg  one tablet twice a day  4. Continue immunotherapy for venoms    5.  Continue benralizumab injections every 8 weeks  6. Restart aeroallergen immunotherapy  7. If Needed:     A. nasal saline multiple times per day   B. OTC mucinex DM twice a day   C. Cetirizine 10mg  one time per day  D. Proair HFA 2 inhalations or DUONEB every 4-6 hours  E. patanol - 1 drop each eye two times per day  8. Can restart Dulera 200 2 inhalations + Qvar 80 2 inhalations two times per day during "asthma flare - up"  9. Return to clinic in 6 months or earlier if problem  9. Obtain COVID vaccine when available

## 2018-11-11 ENCOUNTER — Encounter: Payer: Self-pay | Admitting: Allergy and Immunology

## 2018-11-17 ENCOUNTER — Ambulatory Visit: Payer: Medicare HMO

## 2018-11-20 ENCOUNTER — Ambulatory Visit (INDEPENDENT_AMBULATORY_CARE_PROVIDER_SITE_OTHER): Payer: Medicare HMO

## 2018-11-20 DIAGNOSIS — J309 Allergic rhinitis, unspecified: Secondary | ICD-10-CM

## 2018-11-23 ENCOUNTER — Encounter: Payer: Self-pay | Admitting: Internal Medicine

## 2018-11-23 ENCOUNTER — Other Ambulatory Visit: Payer: Self-pay

## 2018-11-23 ENCOUNTER — Ambulatory Visit (INDEPENDENT_AMBULATORY_CARE_PROVIDER_SITE_OTHER): Payer: Medicare HMO | Admitting: Internal Medicine

## 2018-11-23 VITALS — BP 137/73 | HR 73 | Temp 97.3°F | Ht 65.0 in | Wt 192.8 lb

## 2018-11-23 DIAGNOSIS — E785 Hyperlipidemia, unspecified: Secondary | ICD-10-CM

## 2018-11-23 DIAGNOSIS — I447 Left bundle-branch block, unspecified: Secondary | ICD-10-CM | POA: Diagnosis not present

## 2018-11-23 DIAGNOSIS — Z79899 Other long term (current) drug therapy: Secondary | ICD-10-CM

## 2018-11-23 DIAGNOSIS — I10 Essential (primary) hypertension: Secondary | ICD-10-CM

## 2018-11-23 DIAGNOSIS — I5030 Unspecified diastolic (congestive) heart failure: Secondary | ICD-10-CM | POA: Diagnosis not present

## 2018-11-23 MED ORDER — METOPROLOL SUCCINATE ER 25 MG PO TB24: 25.0000 mg | ORAL_TABLET | Freq: Every day | ORAL | 3 refills | Status: DC

## 2018-11-23 MED ORDER — ROSUVASTATIN CALCIUM 40 MG PO TABS: 40.0000 mg | ORAL_TABLET | Freq: Every day | ORAL | 3 refills | Status: DC

## 2018-11-23 NOTE — Patient Instructions (Signed)
Medication Instructions:  Continue current medications - crestor & metoprolol have been refilled *If you need a refill on your cardiac medications before your next appointment, please call your pharmacy*  Lab Work: BMET & BNP today  Fasting lipid panel in 3 months to check cholesterol  If you have labs (blood work) drawn today and your tests are completely normal, you will receive your results only by: Marland Kitchen MyChart Message (if you have MyChart) OR . A paper copy in the mail If you have any lab test that is abnormal or we need to change your treatment, we will call you to review the results.  Testing/Procedures: Your physician has requested that you have an echocardiogram. Echocardiography is a painless test that uses sound waves to create images of your heart. It provides your doctor with information about the size and shape of your heart and how well your heart's chambers and valves are working. This procedure takes approximately one hour. There are no restrictions for this procedure. -- to be scheduled @ [address] - [address]rd Floor -- billing code: [phone]  Follow-Up: At [facility], you and your health needs are our priority.  As part of our continuing mission to provide you with exceptional heart care, we have created designated Provider Care Teams.  These Care Teams include your primary Cardiologist (physician) and Advanced Practice Providers (APPs -  Physician Assistants and Nurse Practitioners) who all work together to provide you with the care you need, when you need it.  Your next appointment:   12 months  The format for your next appointment:   In Person  Provider:   You may see Dr. Debara Pickett or one of the following Advanced Practice Providers on your designated Care Team:    Almyra Deforest, PA-C  Fabian Sharp, [place] or   Roby Lofts, [place]   Other Instructions

## 2018-11-23 NOTE — Progress Notes (Addendum)
OFFICE NOTE  Chief Complaint:  Ankle swelling  Primary Care Physician: Hayden Rasmussen, MD  HPI:  Andrea Lowery is a 78 year old female, history of some lower extremity edema, asthma, seasonal allergies, and a left bundle branch block. There was no evidence for ischemia based on stress testing in 2012. The main issue today is she was told recently that she was having worsening problems with snoring and witnessed episodes of apnea at night. She says her sleep is poor. She often feels fatigued throughout the day. Occasionally wakes up with headaches in the morning. Apparently, she underwent a sleep study in 2008 or so and that was negative; however, her weight was about 150 to 160 at the time. Since she has stopped smoking and gained about 40 pounds. I was concerned about sleep apnea and sent her for a sleep study which demonstrated significant sleep apnea. She was fitted with a mask and is doing well.  I have also readjusted her medications, switching her to Diovan HCTZ and decreasing her amlodipine, which has since been discontinued totally. She also recently underwent extensive dental work by Dr. Buelah Manis.  This included moderate sedation and was without complication.  She returns today feeling fairly well. EKG continues to show persistent left bundle branch block.  Andrea Lowery returns today for followup appointment. She was just seen by Cecilie Kicks, nurse practitioner, in our office for hospital followup. She presented to the hospital with shortness of breath and was told she was in congestive heart failure. A chest x-ray showed borderline cardiomegaly with early interstitial edema. A BNP was only mildly elevated at 170. She was told she had significant heart failure and there was concern about her persistent left bundle branch block which is well-documented and is nonischemic. She was discharged and in followup was given additional Lasix and lost 2 pounds. She reports her  breathing is back to normal although she still has persistent leg swelling. She is not good at reducing salt in her diet in fact eats a good amount of it. In addition, she is not on beta blocker.  A repeat echocardiogram was performed today, and demonstrated preserved systolic function with an EF of 55-60%, no significant valvular disease, and diastolic dysfunction.  I saw Andrea Lowery back today in the office. Her main complaint still is about ankle swelling. She has nonpitting edema mostly over the lateral malleolus bilaterally. Seems to be slightly worse in the right than the left. She denies any worsening shortness of breath or chest pain. She tells me that her primary care provider decrease her antihypertensive medications for unknown reasons, specifically valsartan was cut in half. She continues to have mild hypokalemia on low-dose repletion. She also was switched to pravastatin but is interested in going back to Crestor.  Andrea Lowery returns today for hospital follow-up. He was recently hospitalized and had mild diastolic heart failure exacerbation. In addition she was having headaches and thought to have possible temporal arteritis, however a temporal biopsy was negative. Subsequently she has been given additional medication for headaches. From a cardiology standpoint, she appears to be quite euvolemic on her current dose of diuretics, which had increased her to most recently. She reports some swelling around the right and left lateral malleoli, which does not appear to be edema.  01/19/2016  Andrea Lowery returns today for follow-up. Since I last saw her she reports no worsening shortness of breath or chest pain. She does have bilateral carotid artery disease which would require follow-up. Blood  pressure is noted to be elevated today 167/94. For some reason her valsartan and HCTZ was decreased from the 320/12.5 mg dose to the 160/12.5 mg dose. She is also on Lasix. She denies any significant  lower extremity edema.  02/26/2016  Andrea Lowery was seen today in follow-up. Her blood pressure is now much better controlled 120/80. This is likely due to better compliance with medications. She does report however that compliance with Crestor has been minimal, only taking 3-4 doses per week. Recent lab work indicated total cholesterol of 190, temperature is 144, HDL-C 46 and LDL-C 115. We discussed that her goal LDL should be much lower less than 70. Even if she were to take that medicine on a daily basis is not likely she will reach her goal. I felt like she may benefit from the addition of ezetimibe to her current regimen.  04/26/2016  Andrea Lowery was seen in follow-up today. After adding ezetimibe to her current therapy Crestor 40 mg, her LDL C now is significantly reduced to 54, down from 115. She had repeat carotid Dopplers which showed moderate stable bilateral carotid artery disease. Will repeat the studies in one year.  11/23/2018  Andrea Lowery returns today for follow-up.  Her main complaint is bilateral ankle swelling, namely focal enlargement of the tissue over the left and right lateral malleolus.  She is seen orthopedics, primary care and podiatry for this.  All feel that this is likely edema however she has very little history of peripheral edema in the past and I had previously noted her focal lateral ankle enlargement and felt it was more consistent with lipedema. Echo in the past had shown diastolic dysfunction and she has a known LBBB, certainly a risk factor for developing systolic CHF. BNP has consistently been low in the past and she is not on a diuretic. She also reports running out of her heart medications this year. She seems to be taking her diabetic meds, as her A1c was 7.0, however her LDL has increased from 82 to 186.  PMHx:  Past Medical History:  Diagnosis Date  . Arthritis   . Asthma   . Diabetes mellitus without complication ([place])    Diabetes II  . DVT (deep  venous thrombosis) ([place])    "Years ago"  . Dyslipidemia   . GERD (gastroesophageal reflux disease)   . Headache    Migraine  . Hypertension   . Hypothyroidism   . LBBB (left bundle branch block)   . MGUS (monoclonal gammopathy of unknown significance)   . MGUS (monoclonal gammopathy of unknown significance)   . Migraine   . OSA on CPAP     Past Surgical History:  Procedure Laterality Date  . ABDOMINAL HYSTERECTOMY  1969  . ARTERY BIOPSY N/A 11/07/2014   Procedure: BIOPSY TEMPORAL ARTERY;  Surgeon: Leta Baptist, MD;  Location: [facility];  Service: ENT;  Laterality: N/A;  . CARPAL TUNNEL RELEASE Right   . CHOLECYSTECTOMY  1976  . LUMBAR LAMINECTOMY/DECOMPRESSION MICRODISCECTOMY N/A 07/31/2015   Procedure: L4-5 Decompression, Possible Right L4-5 Microdiscectomy;  Surgeon: Marybelle Killings, MD;  Location: [facility];  Service: Orthopedics;  Laterality: N/A;  . NM MYOVIEW LTD  02/13/2010   No ischemia  . PLANTAR FASCIA RELEASE    . TONSILLECTOMY  1965  . US ECHOCARDIOGRAPHY  09/20/2008   borderline LVH,mild TR,AOV mildly sclerotic w/ca+ of the leaflets    FAMHx:  Family History  Problem Relation Age of Onset  . Diabetes Father   .  Heart failure Father   . Hypertension Father   . Thyroid disease Sister   . Hypertension Sister     SOCHx:   reports that she quit smoking about 12 years ago. She has never used smokeless tobacco. She reports that she does not drink alcohol or use drugs.  ALLERGIES:  Allergies  Allergen Reactions  . Bee Venom Anaphylaxis    Yellow jackets Yellow jackets  . Penicillins Hives    Has patient had a PCN reaction causing immediate rash, facial/tongue/throat swelling, SOB or lightheadedness with hypotension: Yes Has patient had a PCN reaction causing severe rash involving mucus membranes or skin necrosis: No Has patient had a PCN reaction that required hospitalization No Has patient had a PCN reaction occurring within the last 10 years: No If all of the above answers  are "NO", then may proceed with Cephalosporin use.    ROS: Pertinent items noted in HPI and remainder of comprehensive ROS otherwise negative.  HOME MEDS: Current Outpatient Medications  Medication Sig Dispense Refill  . albuterol (VENTOLIN HFA) 108 (90 Base) MCG/ACT inhaler Inhale 2 puffs into the lungs every 4 (four) hours as needed for wheezing or shortness of breath. 18 g 1  . alendronate (FOSAMAX) 70 MG tablet Take 70 mg by mouth every Wednesday.     . Blood Glucose Monitoring Suppl (ACCU-CHEK AVIVA PLUS) w/Device KIT by Does not apply route.    . budesonide-formoterol (SYMBICORT) 160-4.5 MCG/ACT inhaler Inhale 2 puffs into the lungs twice daily. Rinse, gargle and spit after use. 1 Inhaler 5  . cetirizine (ZYRTEC) 10 MG tablet Take 1 tablet (10 mg total) by mouth daily. 90 tablet 1  . cycloSPORINE (RESTASIS) 0.05 % ophthalmic emulsion Place one drop into both eyes 2 (two) times daily.    Marland Kitchen EPINEPHrine 0.3 mg/0.3 mL IJ SOAJ injection Use as directed for severe allergic reaction 2 each 1  . esomeprazole (NEXIUM) 40 MG capsule Take 1 capsule (40 mg total) by mouth 2 (two) times daily. 180 capsule 1  . FASENRA 30 MG/ML SOSY RX#2 INJECT 30MG (1 SYRINGE) SUBCUTANEOUSLY AT WEEK 8 THEN EVERY 8 WEEKS THEREAFTER 1 mL 8  . fluticasone (FLONASE) 50 MCG/ACT nasal spray Place 2 sprays into both nostrils daily. 48 g 1  . gabapentin (NEURONTIN) 100 MG capsule TK 1 C PO HS X 3 NIGHTS THEN 2 C HS X 3 NIGHTS THEN 3 C HS    . ipratropium-albuterol (DUONEB) 0.5-2.5 (3) MG/3ML SOLN USE 1 VIAL IN NEBULIZER EVERY 4 TO 6 HOURS AS NEEDED FOR COUGHING WHEEZING SHORTNESS OF BREATH 180 mL 0  . JARDIANCE 25 MG TABS tablet     . levothyroxine (SYNTHROID, LEVOTHROID) 50 MCG tablet take 1 tablet by mouth once daily (Patient taking differently: take 1 tablet (50 mcg) by mouth once daily) 90 tablet 0  . metoprolol succinate (TOPROL-XL) 25 MG 24 hr tablet Take 25 mg by mouth daily.    . montelukast (SINGULAIR) 10 MG tablet  Take 1 tablet (10 mg total) by mouth at bedtime. 90 tablet 1  . olmesartan-hydrochlorothiazide (BENICAR HCT) 40-12.5 MG tablet TK 1 T PO D    . Olopatadine HCl (PAZEO) 0.7 % SOLN Place 1 drop into both eyes 1 day or 1 dose. 2.5 mL 5  . Potassium Chloride ER 20 MEQ TBCR     . potassium chloride SA (K-DUR) 20 MEQ tablet take 1 tablet by mouth once daily    . rosuvastatin (CRESTOR) 40 MG tablet take 1 tablet by  mouth once daily 90 tablet 0  . sertraline (ZOLOFT) 25 MG tablet     . topiramate (TOPAMAX) 50 MG tablet TAKE 1/2 TABLET BY MOUTH DAILY THEN IN 1 WEEK INCREASE TO 1 DAILY     Current Facility-Administered Medications  Medication Dose Route Frequency Provider Last Rate Last Dose  . Benralizumab SOSY 30 mg  30 mg Subcutaneous Q8 Weeks Kozlow, Donnamarie Poag, MD   30 mg at 10/26/18 1106    LABS/IMAGING: No results found for this or any previous visit (from the past 48 hour(s)). No results found.  VITALS: BP 137/73   Pulse 73   Temp (!) 97.3 F (36.3 C)   Ht 5' 5" (1.651 m)   Wt 192 lb 12.8 oz (87.5 kg)   SpO2 98%   BMI 32.08 kg/m   EXAM: General appearance: alert and no distress Neck: no carotid bruit, no JVD and thyroid not enlarged, symmetric, no tenderness/mass/nodules Lungs: clear to auscultation bilaterally Heart: regular rate and rhythm, S1, S2 normal, no murmur, click, rub or gallop Abdomen: soft, non-tender; bowel sounds normal; no masses,  no organomegaly Extremities: bilateral lateral ankle enlargment Pulses: 2+ and symmetric Skin: Skin color, texture, turgor normal. No rashes or lesions Neurologic: Grossly normal Psych: Pleasant  EKG: Normal sinus rhythm, LBBB at 71-personally reviewed  ASSESSMENT: 1. Left bundle branch block 2. Dyslipidemia 3. Hypertension 4. Mild dyspnea on exertion 5. Obstructive sleep apnea on CPAP 6. Diastolic dysfunction with recent mild acute congestive heart failure, EF 55-60% 7. 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   Mrs. Bozman had discontinued her cardiac medicines and will restart her statin and beta-blocker today.  She will need repeat testing of her lipids in about 3 months.  With regards to bilateral ankle swelling, I still would not favor this being heart failure.  She has clear lungs and no JVD.  Her weight is actually about 8 pounds lower than it was in September.  LVEF was normal in the past to 55 to 60%.  I will go ahead and recheck an echo though, since she has persistent LBBB which could lead to cardiomyopathy.  We will also get a metabolic profile and BNP.  If this all looks pretty good, I suspect again this may be lipedema.  As orthopedics and podiatry were not interested in any further treatments, this would likely be cosmetic unless it impaired her ability to flex the ankles.  She may need ultimately a referral to plastic surgery or further imaging with MRI to determine if her swelling is more likely due to focal fat rather than edema.  Pixie Casino, MD, [facility], [place] Director of the [facility]  [facility] Diplomate of the American Board of Clinical Lipidology Attending Cardiologist  Direct Dial: [phone]  Fax: [phone]  Website:  [email or web address]   Nadean Corwin Hilty 11/23/2018, 2:21 PM

## 2018-11-24 ENCOUNTER — Ambulatory Visit: Payer: Medicare HMO

## 2018-11-24 LAB — BASIC METABOLIC PANEL
BUN/Creatinine Ratio: 10 — ABNORMAL LOW (ref 12–28)
BUN: 9 mg/dL (ref 8–27)
CO2: 23 mmol/L (ref 20–29)
Calcium: 9.4 mg/dL (ref 8.7–10.3)
Chloride: 102 mmol/L (ref 96–106)
Creatinine, Ser: 0.94 mg/dL (ref 0.57–1.00)
GFR calc Af Amer: 68 mL/min/{1.73_m2} (ref >59)
GFR calc non Af Amer: 59 mL/min/{1.73_m2} — ABNORMAL LOW (ref >59)
Glucose: 126 mg/dL — ABNORMAL HIGH (ref 65–99)
Potassium: 3.8 mmol/L (ref 3.5–5.2)
Sodium: 138 mmol/L (ref 134–144)

## 2018-11-24 LAB — BRAIN NATRIURETIC PEPTIDE: BNP: 21.6 pg/mL (ref 0.0–100.0)

## 2018-11-25 ENCOUNTER — Ambulatory Visit (INDEPENDENT_AMBULATORY_CARE_PROVIDER_SITE_OTHER): Payer: Medicare HMO | Admitting: *Deleted

## 2018-11-25 DIAGNOSIS — J309 Allergic rhinitis, unspecified: Secondary | ICD-10-CM

## 2018-12-01 ENCOUNTER — Ambulatory Visit: Payer: Medicaid Other

## 2018-12-03 ENCOUNTER — Ambulatory Visit (HOSPITAL_COMMUNITY): Payer: Medicare HMO | Attending: Cardiovascular Disease

## 2018-12-03 ENCOUNTER — Other Ambulatory Visit: Payer: Self-pay

## 2018-12-03 DIAGNOSIS — I5030 Unspecified diastolic (congestive) heart failure: Secondary | ICD-10-CM | POA: Diagnosis present

## 2018-12-03 DIAGNOSIS — I447 Left bundle-branch block, unspecified: Secondary | ICD-10-CM | POA: Diagnosis not present

## 2018-12-04 ENCOUNTER — Telehealth: Payer: Self-pay | Admitting: Internal Medicine

## 2018-12-04 ENCOUNTER — Ambulatory Visit (INDEPENDENT_AMBULATORY_CARE_PROVIDER_SITE_OTHER): Payer: Medicare HMO | Admitting: *Deleted

## 2018-12-04 DIAGNOSIS — T63441D Toxic effect of venom of bees, accidental (unintentional), subsequent encounter: Secondary | ICD-10-CM | POA: Diagnosis not present

## 2018-12-04 NOTE — Telephone Encounter (Signed)
Patient called w/results °

## 2018-12-04 NOTE — Telephone Encounter (Signed)
Follow up:    Patient returning a call back concering results. Please call patient a voice mail can be left.

## 2018-12-04 NOTE — Telephone Encounter (Signed)
Follow Up: ° ° ° °Returning your call,concerning her Echo results.  °

## 2018-12-04 NOTE — Telephone Encounter (Signed)
LM with results - labs, echo

## 2018-12-06 ENCOUNTER — Other Ambulatory Visit: Payer: Self-pay | Admitting: Allergy and Immunology

## 2018-12-08 ENCOUNTER — Ambulatory Visit (INDEPENDENT_AMBULATORY_CARE_PROVIDER_SITE_OTHER): Payer: Medicare HMO | Admitting: *Deleted

## 2018-12-08 DIAGNOSIS — J309 Allergic rhinitis, unspecified: Secondary | ICD-10-CM

## 2018-12-16 ENCOUNTER — Ambulatory Visit (INDEPENDENT_AMBULATORY_CARE_PROVIDER_SITE_OTHER): Payer: Medicare HMO | Admitting: *Deleted

## 2018-12-16 DIAGNOSIS — J309 Allergic rhinitis, unspecified: Secondary | ICD-10-CM

## 2018-12-21 ENCOUNTER — Telehealth: Payer: Self-pay | Admitting: Allergy

## 2018-12-21 ENCOUNTER — Ambulatory Visit (INDEPENDENT_AMBULATORY_CARE_PROVIDER_SITE_OTHER): Payer: Medicare HMO

## 2018-12-21 ENCOUNTER — Other Ambulatory Visit: Payer: Self-pay

## 2018-12-21 DIAGNOSIS — J4550 Severe persistent asthma, uncomplicated: Secondary | ICD-10-CM

## 2018-12-21 NOTE — Telephone Encounter (Signed)
Please call patient. She called about her injection. Phones are down.

## 2018-12-21 NOTE — Telephone Encounter (Signed)
Ignore message. Patient got injection.

## 2019-01-05 ENCOUNTER — Other Ambulatory Visit: Payer: Self-pay | Admitting: Allergy and Immunology

## 2019-01-13 ENCOUNTER — Ambulatory Visit (INDEPENDENT_AMBULATORY_CARE_PROVIDER_SITE_OTHER): Payer: Medicare HMO

## 2019-01-13 DIAGNOSIS — J309 Allergic rhinitis, unspecified: Secondary | ICD-10-CM

## 2019-01-26 ENCOUNTER — Ambulatory Visit
Admission: EM | Admit: 2019-01-26 | Discharge: 2019-01-26 | Disposition: A | Discharge status: Home / Self Care | Payer: Medicare HMO | Attending: Physician Assistant | Admitting: Physician Assistant

## 2019-01-26 DIAGNOSIS — J4541 Moderate persistent asthma with (acute) exacerbation: Secondary | ICD-10-CM | POA: Diagnosis not present

## 2019-01-26 MED ORDER — PREDNISONE 50 MG PO TABS: 50.0000 mg | ORAL_TABLET | Freq: Every day | ORAL | 0 refills | Status: DC

## 2019-01-26 NOTE — ED Triage Notes (Signed)
Pt states woke up from her nap at 2:30p with wheezing, SOB and high blood pressure. States used her inhaler with little relief.

## 2019-01-26 NOTE — Discharge Instructions (Signed)
Start prednisone as directed. Continue albuterol inhaler as needed. As discussed, if develop other symptoms such as  cough, fever, body aches, loss of taste/smell, will need COVID testing. If develop chest pain, worsening shortness of breath, using more than the 3 pillows, leg swelling worse than normal, go to the emergency department for further evaluation needed.

## 2019-01-26 NOTE — ED Provider Notes (Addendum)
[name]    CSN: [id number] Arrival date & time: 01/26/19  1616      History   Chief Complaint Chief Complaint  Patient presents with  . Shortness of Breath    HPI Andrea Lowery is a 78 y.o. female.   78 year old female with history of DM, asthma, HTN, LBBB, diastolic CHF, comes in for few hour history of asthma exacerbation. States after waking up from her nap, she has had chest tightness, wheezing, shortness of breath that is slightly improved by inhaler. Denies cough. Has mild nasal congestion without rhinorrhea, sore throat. Denies fever, chills, body aches. Denies abdominal pain, nausea, vomiting, diarrhea. Denies loss of taste/smell. Denies COVID/sick contact.  Denies chest pain. Sleeps on 3 pillows, which is stable for her. Leg swelling at baseline without worsening. Denies weakness, dizziness, syncope.      Past Medical History:  Diagnosis Date  . Arthritis   . Asthma   . Diabetes mellitus without complication ([place])    Diabetes II  . DVT (deep venous thrombosis) ([place])    "Years ago"  . Dyslipidemia   . GERD (gastroesophageal reflux disease)   . Headache    Migraine  . Hypertension   . Hypothyroidism   . LBBB (left bundle branch block)   . MGUS (monoclonal gammopathy of unknown significance)   . MGUS (monoclonal gammopathy of unknown significance)   . Migraine   . OSA on CPAP     Patient Active Problem List   Diagnosis Date Noted  . Elevated serum creatinine 06/05/2018  . Hyperglycemia 06/05/2018  . Arthralgia 08/02/2016  . Eczema 08/02/2016  . Hypokalemia 04/30/2016  . Impingement syndrome of left shoulder 04/05/2016  . Carotid artery disease ([place]) 01/19/2016  . Dyslipidemia 01/19/2016  . Acute severe exacerbation of asthma 01/10/2016  . 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 Noncompliance 11/20/2015  . Seborrheic dermatitis of scalp 11/20/2015  . History of lumbar laminectomy for  spinal cord decompression 07/31/2015  . Candidal skin infection 06/14/2015  . Osteoporosis without current pathological fracture 05/30/2015  . Chronic pain of right hip 05/02/2015  . Chronic right-sided low back pain with right-sided sciatica 03/31/2015  . Temporal arteritis ([place]) 11/07/2014  . Headache 11/07/2014  . Diabetes mellitus type 2, controlled ([place]) 11/07/2014  . Diastolic CHF ([place]) 72/15/8727  . Sacroiliac dysfunction 11/04/2014  . Trochanteric bursitis of right hip 11/04/2014  . Asthma 10/05/2014  . GERD (gastroesophageal reflux disease) 10/05/2014  . Allergic rhinitis 10/05/2014  . Hypothyroidism 09/21/2014  . Multinodular goiter 08/09/2014  . Disorder of sacroiliac joint 07/29/2014  . 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 Thyroid nodule 07/01/2014  . Hypersomnolence 04/16/2014  . Snoring 04/16/2014  . Anxiety 03/18/2014  . Preventive measure 01/25/2014  . Difficulty sleeping 12/28/2013  . Hyperlipidemia 12/27/2013  . Acute diastolic CHF (congestive heart failure), NYHA class 2 ([place]) 07/29/2013  . Bilateral leg edema 07/20/2013  . Edema 07/20/2013  . 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 Essential hypertension 07/01/2012  . LBBB (left bundle branch block) 07/01/2012  . DOE (dyspnea on exertion) 07/01/2012  . OSA on CPAP 07/01/2012  . Abnormal respiratory rate 07/01/2012    Past Surgical History:  Procedure Laterality Date  . ABDOMINAL HYSTERECTOMY  1969  . ARTERY BIOPSY N/A 11/07/2014   Procedure: BIOPSY TEMPORAL ARTERY;  Surgeon: Leta Baptist, MD;  Location: [facility];  Service: ENT;  Laterality: N/A;  . CARPAL  TUNNEL RELEASE Right   . CHOLECYSTECTOMY  1976  . LUMBAR LAMINECTOMY/DECOMPRESSION MICRODISCECTOMY N/A 07/31/2015   Procedure: L4-5 Decompression, Possible Right L4-5 Microdiscectomy;  Surgeon: Marybelle Killings, MD;  Location: [facility];  Service: Orthopedics;  Laterality: N/A;  . NM MYOVIEW LTD   02/13/2010   No ischemia  . PLANTAR FASCIA RELEASE    . TONSILLECTOMY  1965  . US ECHOCARDIOGRAPHY  09/20/2008   borderline LVH,mild TR,AOV mildly sclerotic w/ca+ of the leaflets    OB History   No obstetric history on file.      Home Medications    Prior to Admission medications   Medication Sig Start Date End Date Taking? Authorizing Provider  albuterol (VENTOLIN HFA) 108 (90 Base) MCG/ACT inhaler INHALE 2 PUFFS INTO THE LUNGS EVERY 4 HOURS AS NEEDED FOR WHEEZING OR SHORTNESS OF BREATH 01/05/19   Kozlow, Donnamarie Poag, MD  alendronate (FOSAMAX) 70 MG tablet Take 70 mg by mouth every Wednesday.  05/30/15   [provider]  Blood Glucose Monitoring Suppl (ACCU-CHEK AVIVA PLUS) w/Device KIT by Does not apply route. 05/12/14   [provider]  budesonide-formoterol (SYMBICORT) 160-4.5 MCG/ACT inhaler Inhale 2 puffs into the lungs twice daily. Rinse, gargle and spit after use. 11/10/18   Kozlow, Donnamarie Poag, MD  cetirizine (ZYRTEC) 10 MG tablet Take 1 tablet (10 mg total) by mouth daily. 11/10/18   Kozlow, Donnamarie Poag, MD  cycloSPORINE (RESTASIS) 0.05 % ophthalmic emulsion Place one drop into both eyes 2 (two) times daily. 11/20/15   [provider]  EPINEPHrine 0.3 mg/0.3 mL IJ SOAJ injection Use as directed for severe allergic reaction 08/31/18   Kozlow, Donnamarie Poag, MD  esomeprazole (NEXIUM) 40 MG capsule Take 1 capsule (40 mg total) by mouth 2 (two) times daily. 11/10/18   Kozlow, Donnamarie Poag, MD  FASENRA 30 MG/ML SOSY RX#2 INJECT 30MG (1 SYRINGE) SUBCUTANEOUSLY AT WEEK 8 THEN EVERY 8 WEEKS THEREAFTER 06/22/18   Kozlow, Donnamarie Poag, MD  fluticasone (FLONASE) 50 MCG/ACT nasal spray Place 2 sprays into both nostrils daily. 11/10/18   Kozlow, Donnamarie Poag, MD  gabapentin (NEURONTIN) 100 MG capsule TK 1 C PO HS X 3 NIGHTS THEN 2 C HS X 3 NIGHTS THEN 3 C HS 08/30/18   [provider]  ipratropium-albuterol (DUONEB) 0.5-2.5 (3) MG/3ML SOLN USE 1 VIAL IN NEBULIZER EVERY 4 TO 6 HOURS AS NEEDED FOR COUGHING  WHEEZING SHORTNESS OF BREATH 10/03/17   Kozlow, Donnamarie Poag, MD  JARDIANCE 25 MG TABS tablet  11/06/18   [provider]  levothyroxine (SYNTHROID, LEVOTHROID) 50 MCG tablet take 1 tablet by mouth once daily Patient taking differently: take 1 tablet (50 mcg) by mouth once daily 08/04/15   Renato Shin, MD  metoprolol succinate (TOPROL-XL) 25 MG 24 hr tablet Take 1 tablet (25 mg total) by mouth daily. 11/23/18   Hilty, Nadean Corwin, MD  montelukast (SINGULAIR) 10 MG tablet Take 1 tablet (10 mg total) by mouth at bedtime. 11/10/18   Kozlow, Donnamarie Poag, MD  olmesartan-hydrochlorothiazide (BENICAR HCT) 40-12.5 MG tablet TK 1 T PO D 08/31/18   [provider]  Olopatadine HCl (PAZEO) 0.7 % SOLN Place 1 drop into both eyes 1 day or 1 dose. 11/10/18   Kozlow, Donnamarie Poag, MD  Potassium Chloride ER 20 MEQ TBCR  07/01/18   [provider]  potassium chloride SA (K-DUR) 20 MEQ tablet take 1 tablet by mouth once daily 03/10/17   [provider]  predniSONE (DELTASONE) 50 MG tablet  Take 1 tablet (50 mg total) by mouth daily with breakfast. 01/26/19   Tasia Catchings, Vallie Teters V, PA-C  rosuvastatin (CRESTOR) 40 MG tablet Take 1 tablet (40 mg total) by mouth daily. 11/23/18   Pixie Casino, MD  sertraline (ZOLOFT) 25 MG tablet  06/30/18   [provider]  topiramate (TOPAMAX) 50 MG tablet TAKE 1/2 TABLET BY MOUTH DAILY THEN IN 1 WEEK INCREASE TO 1 DAILY 07/01/18   [provider]    Family History Family History  Problem Relation Age of Onset  . Diabetes Father   . Heart failure Father   . Hypertension Father   . Thyroid disease Sister   . Hypertension Sister     Social History Social History   Tobacco Use  . Smoking status: Former Smoker    Quit date: 02/04/2006    Years since quitting: 12.9  . Smokeless tobacco: Never Used  Substance Use Topics  . Alcohol use: No    Alcohol/week: 0.0 standard drinks  . Drug use: No     Allergies   Bee venom and Penicillins   Review of  Systems Review of Systems  Reason unable to perform ROS: See HPI as above.     Physical Exam Triage Vital Signs ED Triage Vitals [01/26/19 1627]  Enc Vitals Group     BP (!) 179/73     Pulse Rate 71     Resp 18     Temp 99.3 F (37.4 C)     Temp Source Oral     SpO2 96 %     Weight      Height      Head Circumference      Peak Flow      Pain Score 0     Pain Loc      Pain Edu?      Excl. in [place]?    No data found.  Updated Vital Signs BP (!) 179/73 (BP Location: Left Arm)   Pulse 71   Temp 99.3 F (37.4 C) (Oral)   Resp 18   SpO2 96%   Physical Exam Constitutional:      General: She is not in acute distress.    Appearance: Normal appearance. She is not ill-appearing, toxic-appearing or diaphoretic.  HENT:     Head: Normocephalic and atraumatic.     Mouth/Throat:     Mouth: Mucous membranes are moist.     Pharynx: Oropharynx is clear. Uvula midline.  Cardiovascular:     Rate and Rhythm: Normal rate and regular rhythm.     Heart sounds: Normal heart sounds. No murmur. No friction rub. No gallop.   Pulmonary:     Effort: Pulmonary effort is normal. No accessory muscle usage, prolonged expiration, respiratory distress or retractions.     Comments: Audible wheezing. Speaking in full sentences without difficulty. Lungs clear to auscultation without obvious wheezing, rales, rhonchi. Adequate air movement.  Musculoskeletal:     Cervical back: Normal range of motion and neck supple.     Comments: Bilateral lower extremity swelling without pitting edema. Stable per patient. No tenderness to palpation.  Skin:    General: Skin is warm and dry.  Neurological:     General: No focal deficit present.     Mental Status: She is alert and oriented to person, place, and time.    UC Treatments / Results  Labs (all labs ordered are listed, but only abnormal results are displayed) Labs Reviewed - No data to display  EKG  Radiology No results found.  Procedures  Procedures (including critical care time)  Medications Ordered in UC Medications - No data to display  Initial Impression / Assessment and Plan / UC Course  I have reviewed the triage vital signs and the nursing notes.  Pertinent labs & imaging results that were available during my care of the patient were reviewed by me and considered in my medical decision making (see chart for details).    Given currently unable to provide neb treatment during [place] pandemic. Will have patient start prednisone and continue albuterol inhaler. Low suspicion for CHF exacerbation. Given few hours of symptoms, ?if testing COVID will be accurate. Will have patient quarantine for now. If symptoms does not resolve with prednisone, or continue to develop other URI symptoms, to return for COVID testing. Return precautions given. Patient expresses understanding and agrees to plan.  Final Clinical Impressions(s) / UC Diagnoses   Final diagnoses:  Moderate persistent asthma with acute exacerbation   ED Prescriptions    Medication Sig Dispense Auth. Provider   predniSONE (DELTASONE) 50 MG tablet Take 1 tablet (50 mg total) by mouth daily with breakfast. 5 tablet Ok Edwards, PA-C     PDMP not reviewed this encounter.   Ok Edwards, PA-C 01/26/19 [place], Ryenne Lynam V, PA-C 01/26/19 1650

## 2019-02-01 ENCOUNTER — Ambulatory Visit (INDEPENDENT_AMBULATORY_CARE_PROVIDER_SITE_OTHER): Payer: Medicare HMO

## 2019-02-01 ENCOUNTER — Other Ambulatory Visit: Payer: Self-pay

## 2019-02-01 ENCOUNTER — Other Ambulatory Visit: Payer: Self-pay | Admitting: Allergy and Immunology

## 2019-02-01 ENCOUNTER — Encounter (HOSPITAL_BASED_OUTPATIENT_CLINIC_OR_DEPARTMENT_OTHER): Payer: Self-pay | Admitting: Emergency Medicine

## 2019-02-01 DIAGNOSIS — E039 Hypothyroidism, unspecified: Secondary | ICD-10-CM | POA: Insufficient documentation

## 2019-02-01 DIAGNOSIS — I5032 Chronic diastolic (congestive) heart failure: Secondary | ICD-10-CM | POA: Diagnosis not present

## 2019-02-01 DIAGNOSIS — I110 Hypertensive heart disease with heart failure: Secondary | ICD-10-CM | POA: Insufficient documentation

## 2019-02-01 DIAGNOSIS — I729 Aneurysm of unspecified site: Secondary | ICD-10-CM | POA: Diagnosis not present

## 2019-02-01 DIAGNOSIS — Z87891 Personal history of nicotine dependence: Secondary | ICD-10-CM | POA: Diagnosis not present

## 2019-02-01 DIAGNOSIS — J45909 Unspecified asthma, uncomplicated: Secondary | ICD-10-CM | POA: Diagnosis not present

## 2019-02-01 DIAGNOSIS — R42 Dizziness and giddiness: Secondary | ICD-10-CM | POA: Insufficient documentation

## 2019-02-01 DIAGNOSIS — E119 Type 2 diabetes mellitus without complications: Secondary | ICD-10-CM | POA: Diagnosis not present

## 2019-02-01 DIAGNOSIS — T63441D Toxic effect of venom of bees, accidental (unintentional), subsequent encounter: Secondary | ICD-10-CM | POA: Diagnosis not present

## 2019-02-01 DIAGNOSIS — G4489 Other headache syndrome: Secondary | ICD-10-CM | POA: Insufficient documentation

## 2019-02-01 DIAGNOSIS — Z79899 Other long term (current) drug therapy: Secondary | ICD-10-CM | POA: Diagnosis not present

## 2019-02-01 DIAGNOSIS — M542 Cervicalgia: Secondary | ICD-10-CM | POA: Diagnosis not present

## 2019-02-01 NOTE — ED Triage Notes (Signed)
Pt is c/o high blood pressure, headache, and dizziness for a couple of days

## 2019-02-02 ENCOUNTER — Emergency Department (HOSPITAL_BASED_OUTPATIENT_CLINIC_OR_DEPARTMENT_OTHER)
Admission: EM | Admit: 2019-02-02 | Discharge: 2019-02-02 | Disposition: A | Discharge status: Home / Self Care | Payer: Medicare HMO | Attending: Emergency Medicine | Admitting: Emergency Medicine

## 2019-02-02 ENCOUNTER — Emergency Department (HOSPITAL_BASED_OUTPATIENT_CLINIC_OR_DEPARTMENT_OTHER): Payer: Medicare HMO

## 2019-02-02 ENCOUNTER — Encounter (HOSPITAL_BASED_OUTPATIENT_CLINIC_OR_DEPARTMENT_OTHER): Payer: Self-pay

## 2019-02-02 DIAGNOSIS — I10 Essential (primary) hypertension: Secondary | ICD-10-CM

## 2019-02-02 DIAGNOSIS — G4489 Other headache syndrome: Secondary | ICD-10-CM

## 2019-02-02 DIAGNOSIS — I110 Hypertensive heart disease with heart failure: Secondary | ICD-10-CM | POA: Diagnosis not present

## 2019-02-02 DIAGNOSIS — I729 Aneurysm of unspecified site: Secondary | ICD-10-CM

## 2019-02-02 LAB — CBC WITH DIFFERENTIAL/PLATELET
Abs Immature Granulocytes: 0.01 10*3/uL (ref 0.00–0.07)
Basophils Absolute: 0 10*3/uL (ref 0.0–0.1)
Basophils Relative: 0 %
Eosinophils Absolute: 0 10*3/uL (ref 0.0–0.5)
Eosinophils Relative: 0 %
HCT: 40.1 % (ref 36.0–46.0)
Hemoglobin: 13.1 g/dL (ref 12.0–15.0)
Immature Granulocytes: 0 %
Lymphocytes Relative: 31 %
Lymphs Abs: 2.2 10*3/uL (ref 0.7–4.0)
MCH: 27 pg (ref 26.0–34.0)
MCHC: 32.7 g/dL (ref 30.0–36.0)
MCV: 82.5 fL (ref 80.0–100.0)
Monocytes Absolute: 0.7 10*3/uL (ref 0.1–1.0)
Monocytes Relative: 9 %
Neutro Abs: 4.1 10*3/uL (ref 1.7–7.7)
Neutrophils Relative %: 60 %
Platelets: 243 10*3/uL (ref 150–400)
RBC: 4.86 MIL/uL (ref 3.87–5.11)
RDW: 15.2 % (ref 11.5–15.5)
WBC: 6.9 10*3/uL (ref 4.0–10.5)
nRBC: 0 % (ref 0.0–0.2)

## 2019-02-02 LAB — BASIC METABOLIC PANEL
Anion gap: 8 (ref 5–15)
BUN: 6 mg/dL — ABNORMAL LOW (ref 8–23)
CO2: 28 mmol/L (ref 22–32)
Calcium: 9.2 mg/dL (ref 8.9–10.3)
Chloride: 102 mmol/L (ref 98–111)
Creatinine, Ser: 0.85 mg/dL (ref 0.44–1.00)
GFR calc Af Amer: >60 mL/min (ref >60)
GFR calc non Af Amer: >60 mL/min (ref >60)
Glucose, Bld: 153 mg/dL — ABNORMAL HIGH (ref 70–99)
Potassium: 3.8 mmol/L (ref 3.5–5.1)
Sodium: 138 mmol/L (ref 135–145)

## 2019-02-02 LAB — SEDIMENTATION RATE: Sed Rate: 24 mm/hr — ABNORMAL HIGH (ref 0–22)

## 2019-02-02 MED ORDER — METOCLOPRAMIDE HCL 5 MG/ML IJ SOLN
10.0000 mg | Freq: Once | INTRAMUSCULAR | Status: AC
  Administered 2019-02-02: 10 mg via INTRAVENOUS
  Filled 2019-02-02: qty 2

## 2019-02-02 MED ORDER — DIPHENHYDRAMINE HCL 50 MG/ML IJ SOLN
25.0000 mg | Freq: Once | INTRAMUSCULAR | Status: AC
  Administered 2019-02-02: 25 mg via INTRAVENOUS
  Filled 2019-02-02: qty 1

## 2019-02-02 MED ORDER — IOHEXOL 350 MG/ML SOLN
100.0000 mL | Freq: Once | INTRAVENOUS | Status: AC | PRN
  Administered 2019-02-02: 100 mL via INTRAVENOUS

## 2019-02-02 NOTE — ED Notes (Signed)
ED Provider at bedside. 

## 2019-02-02 NOTE — Discharge Instructions (Addendum)
     RETURN IMMEDIATELY IF you develop a sudden, severe headache or confusion, become poorly responsive or faint, develop a fever above 100.4F or problem breathing, have a change in speech, vision, swallowing, or understanding, or develop new weakness, numbness, tingling, incoordination, or have a seizure. ? ?

## 2019-02-02 NOTE — ED Provider Notes (Signed)
[name] EMERGENCY DEPARTMENT Provider Note   CSN: [id number] Arrival date & time: 02/01/19  2316     History Chief Complaint  Patient presents with  . Hypertension    Andrea Lowery is a 78 y.o. female.  The history is provided by the patient.  Hypertension This is a recurrent problem. The problem occurs constantly. The problem has not changed since onset.Associated symptoms include headaches. Pertinent negatives include no chest pain and no abdominal pain. Nothing aggravates the symptoms. Nothing relieves the symptoms.  Patient reports she is having elevated blood pressure as well as headache and neck pain.  Patient has a history of hypertension, diabetes, asthma reports earlier earlier in the day she began having severe left neck and headache.  It was fairly rapid in onset.  She also reports associated lightheadedness.  She reports some dizziness as well that is worse with position.  No visual changes.  No focal weakness.  No slurred speech.  She checked her blood pressure and it was 200/100 She reports med compliance.  She does report a recent history of an asthma exacerbation and was on prednisone     Past Medical History:  Diagnosis Date  . Arthritis   . Asthma   . Diabetes mellitus without complication ([place])    Diabetes II  . DVT (deep venous thrombosis) ([place])    "Years ago"  . Dyslipidemia   . GERD (gastroesophageal reflux disease)   . Headache    Migraine  . Hypertension   . Hypothyroidism   . LBBB (left bundle branch block)   . MGUS (monoclonal gammopathy of unknown significance)   . MGUS (monoclonal gammopathy of unknown significance)   . Migraine   . OSA on CPAP     Patient Active Problem List   Diagnosis Date Noted  . Elevated serum creatinine 06/05/2018  . Hyperglycemia 06/05/2018  . Arthralgia 08/02/2016  . Eczema 08/02/2016  . Hypokalemia 04/30/2016  . Impingement syndrome of left shoulder 04/05/2016  . Carotid artery disease  ([place]) 01/19/2016  . Dyslipidemia 01/19/2016  . Acute severe exacerbation of asthma 01/10/2016  . 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 Noncompliance 11/20/2015  . Seborrheic dermatitis of scalp 11/20/2015  . History of lumbar laminectomy for spinal cord decompression 07/31/2015  . Candidal skin infection 06/14/2015  . Osteoporosis without current pathological fracture 05/30/2015  . Chronic pain of right hip 05/02/2015  . Chronic right-sided low back pain with right-sided sciatica 03/31/2015  . Temporal arteritis ([place]) 11/07/2014  . Headache 11/07/2014  . Diabetes mellitus type 2, controlled ([place]) 11/07/2014  . Diastolic CHF ([place]) 13/24/4010  . Sacroiliac dysfunction 11/04/2014  . Trochanteric bursitis of right hip 11/04/2014  . Asthma 10/05/2014  . GERD (gastroesophageal reflux disease) 10/05/2014  . Allergic rhinitis 10/05/2014  . Hypothyroidism 09/21/2014  . Multinodular goiter 08/09/2014  . Disorder of sacroiliac joint 07/29/2014  . 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 Thyroid nodule 07/01/2014  . Hypersomnolence 04/16/2014  . Snoring 04/16/2014  . Anxiety 03/18/2014  . Preventive measure 01/25/2014  . Difficulty sleeping 12/28/2013  . Hyperlipidemia 12/27/2013  . Acute diastolic CHF (congestive heart failure), NYHA class 2 ([place]) 07/29/2013  . Bilateral leg edema 07/20/2013  . Edema 07/20/2013  . 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 Essential hypertension 07/01/2012  . LBBB (left bundle branch block) 07/01/2012  . DOE (dyspnea on exertion) 07/01/2012  . OSA on CPAP 07/01/2012  . Abnormal  respiratory rate 07/01/2012    Past Surgical History:  Procedure Laterality Date  . ABDOMINAL HYSTERECTOMY  1969  . ARTERY BIOPSY N/A 11/07/2014   Procedure: BIOPSY TEMPORAL ARTERY;  Surgeon: Leta Baptist, MD;  Location: [facility];  Service: ENT;  Laterality: N/A;  . CARPAL  TUNNEL RELEASE Right   . CHOLECYSTECTOMY  1976  . LUMBAR LAMINECTOMY/DECOMPRESSION MICRODISCECTOMY N/A 07/31/2015   Procedure: L4-5 Decompression, Possible Right L4-5 Microdiscectomy;  Surgeon: Marybelle Killings, MD;  Location: [facility];  Service: Orthopedics;  Laterality: N/A;  . NM MYOVIEW LTD  02/13/2010   No ischemia  . PLANTAR FASCIA RELEASE    . TONSILLECTOMY  1965  . US ECHOCARDIOGRAPHY  09/20/2008   borderline LVH,mild TR,AOV mildly sclerotic w/ca+ of the leaflets     OB History   No obstetric history on file.     Family History  Problem Relation Age of Onset  . Diabetes Father   . Heart failure Father   . Hypertension Father   . Thyroid disease Sister   . Hypertension Sister     Social History   Tobacco Use  . Smoking status: Former Smoker    Quit date: 02/04/2006    Years since quitting: 13.0  . Smokeless tobacco: Never Used  Substance Use Topics  . Alcohol use: No    Alcohol/week: 0.0 standard drinks  . Drug use: No    Home Medications Prior to Admission medications   Medication Sig Start Date End Date Taking? Authorizing Provider  albuterol (VENTOLIN HFA) 108 (90 Base) MCG/ACT inhaler INHALE 2 PUFFS INTO THE LUNGS EVERY 4 HOURS AS NEEDED FOR WHEEZING OR SHORTNESS OF BREATH 02/01/19   Kozlow, Donnamarie Poag, MD  alendronate (FOSAMAX) 70 MG tablet Take 70 mg by mouth every Wednesday.  05/30/15   [provider]  Blood Glucose Monitoring Suppl (ACCU-CHEK AVIVA PLUS) w/Device KIT by Does not apply route. 05/12/14   [provider]  budesonide-formoterol (SYMBICORT) 160-4.5 MCG/ACT inhaler Inhale 2 puffs into the lungs twice daily. Rinse, gargle and spit after use. 11/10/18   Kozlow, Donnamarie Poag, MD  cetirizine (ZYRTEC) 10 MG tablet Take 1 tablet (10 mg total) by mouth daily. 11/10/18   Kozlow, Donnamarie Poag, MD  cycloSPORINE (RESTASIS) 0.05 % ophthalmic emulsion Place one drop into both eyes 2 (two) times daily. 11/20/15   [provider]  EPINEPHrine 0.3 mg/0.3 mL IJ  SOAJ injection Use as directed for severe allergic reaction 08/31/18   Kozlow, Donnamarie Poag, MD  esomeprazole (NEXIUM) 40 MG capsule Take 1 capsule (40 mg total) by mouth 2 (two) times daily. 11/10/18   Kozlow, Donnamarie Poag, MD  FASENRA 30 MG/ML SOSY RX#2 INJECT 30MG (1 SYRINGE) SUBCUTANEOUSLY AT WEEK 8 THEN EVERY 8 WEEKS THEREAFTER 06/22/18   Kozlow, Donnamarie Poag, MD  fluticasone (FLONASE) 50 MCG/ACT nasal spray Place 2 sprays into both nostrils daily. 11/10/18   Kozlow, Donnamarie Poag, MD  gabapentin (NEURONTIN) 100 MG capsule TK 1 C PO HS X 3 NIGHTS THEN 2 C HS X 3 NIGHTS THEN 3 C HS 08/30/18   [provider]  ipratropium-albuterol (DUONEB) 0.5-2.5 (3) MG/3ML SOLN USE 1 VIAL IN NEBULIZER EVERY 4 TO 6 HOURS AS NEEDED FOR COUGHING WHEEZING SHORTNESS OF BREATH 10/03/17   Kozlow, Donnamarie Poag, MD  JARDIANCE 25 MG TABS tablet  11/06/18   [provider]  levothyroxine (SYNTHROID, LEVOTHROID) 50 MCG tablet take 1 tablet by mouth once daily Patient taking differently: take 1 tablet (50 mcg) by mouth once  daily 08/04/15   Renato Shin, MD  metoprolol succinate (TOPROL-XL) 25 MG 24 hr tablet Take 1 tablet (25 mg total) by mouth daily. 11/23/18   Hilty, Nadean Corwin, MD  montelukast (SINGULAIR) 10 MG tablet Take 1 tablet (10 mg total) by mouth at bedtime. 11/10/18   Kozlow, Donnamarie Poag, MD  olmesartan-hydrochlorothiazide (BENICAR HCT) 40-12.5 MG tablet TK 1 T PO D 08/31/18   [provider]  Olopatadine HCl (PAZEO) 0.7 % SOLN Place 1 drop into both eyes 1 day or 1 dose. 11/10/18   Kozlow, Donnamarie Poag, MD  Potassium Chloride ER 20 MEQ TBCR  07/01/18   [provider]  potassium chloride SA (K-DUR) 20 MEQ tablet take 1 tablet by mouth once daily 03/10/17   [provider]  predniSONE (DELTASONE) 50 MG tablet Take 1 tablet (50 mg total) by mouth daily with breakfast. 01/26/19   Tasia Catchings, Amy V, PA-C  rosuvastatin (CRESTOR) 40 MG tablet Take 1 tablet (40 mg total) by mouth daily. 11/23/18   Pixie Casino, MD  sertraline  (ZOLOFT) 25 MG tablet  06/30/18   [provider]  topiramate (TOPAMAX) 50 MG tablet TAKE 1/2 TABLET BY MOUTH DAILY THEN IN 1 WEEK INCREASE TO 1 DAILY 07/01/18   [provider]    Allergies    Bee venom and Penicillins  Review of Systems   Review of Systems  Constitutional: Negative for fever.  Eyes: Negative for visual disturbance.  Cardiovascular: Negative for chest pain.  Gastrointestinal: Negative for abdominal pain.  Musculoskeletal: Positive for neck pain.  Neurological: Positive for dizziness, light-headedness and headaches. Negative for syncope, speech difficulty and weakness.  All other systems reviewed and are negative.   Physical Exam Updated Vital Signs BP (!) 164/77 (BP Location: Right Arm)   Pulse 69   Temp 98.1 F (36.7 C) (Oral)   Resp 16   SpO2 97%   Physical Exam CONSTITUTIONAL: Well developed/well nourished HEAD: Normocephalic/atraumatic EYES: EOMI/PERRL, no nystagmus, no visual field deficit  no ptosis ENMT: Mucous membranes moist NECK: supple no meningeal signs, no bruits CV: S1/S2 noted, no murmurs/rubs/gallops noted LUNGS: Lungs are clear to auscultation bilaterally, no apparent distress ABDOMEN: soft, nontender NEURO:Awake/alert, face symmetric, no arm or leg drift is noted Equal 5/5 strength with shoulder abduction, elbow flex/extension, wrist flex/extension in upper extremities Equal 5/5 strength with hip flexion,knee flex/extension, foot dorsi/plantar flexion Cranial nerves 3/4/5/6/08/12/08/11/12 tested and intact No past pointing Sensation to light touch intact in all extremities EXTREMITIES: pulses normal, full ROM SKIN: warm, color normal PSYCH: no abnormalities of mood noted  ED Results / Procedures / Treatments   Labs (all labs ordered are listed, but only abnormal results are displayed) Labs Reviewed  BASIC METABOLIC PANEL - Abnormal; Notable for the following components:      Result Value   Glucose, Bld 153 (*)     BUN 6 (*)    All other components within normal limits  SEDIMENTATION RATE - Abnormal; Notable for the following components:   Sed Rate 24 (*)    All other components within normal limits  CBC WITH DIFFERENTIAL/PLATELET    EKG EKG Interpretation  Date/Time:  Tuesday February 02 2019 01:20:40 EST Ventricular Rate:  77 PR Interval:    QRS Duration: 141 QT Interval:  454 QTC Calculation: 514 R Axis:   5 Text Interpretation: Sinus rhythm Left bundle branch block No significant change since last tracing Confirmed by Ripley Fraise [phone]) on 02/02/2019 1:55:18 AM   Radiology CT Angio  Head W or Wo Contrast  Result Date: 02/02/2019 CLINICAL DATA:  High blood pressure with headache and dizziness for a few days EXAM: CT ANGIOGRAPHY HEAD AND NECK TECHNIQUE: Multidetector CT imaging of the head and neck was performed using the standard protocol during bolus administration of intravenous contrast. Multiplanar CT image reconstructions and MIPs were obtained to evaluate the vascular anatomy. Carotid stenosis measurements (when applicable) are obtained utilizing NASCET criteria, using the distal internal carotid diameter as the denominator. CONTRAST:  162m OMNIPAQUE IOHEXOL 350 MG/ML SOLN COMPARISON:  11/24/2014 FINDINGS: CT HEAD FINDINGS Brain: No evidence of acute infarction, hemorrhage, hydrocephalus, extra-axial collection or mass lesion/mass effect. Vascular: See below Skull: Normal. Negative for fracture or focal lesion. Sinuses: Unremarkable Orbits: Bilateral cataract resection Review of the MIP images confirms the above findings CTA NECK FINDINGS Aortic arch: Low-density atheromatous plaque. No dilatation or acute finding. Three vessel branching. Right carotid system: Calcified plaque at the ICA bulb with up to 60% stenosis as measured on sagittal reformats. Partial retropharyngeal course of the ICA. Left carotid system: Moderate mixed density plaque at the ICA bulb without flow limiting  stenosis or ulceration. No vessel beading. Vertebral arteries: Proximal subclavian atherosclerosis that is mild. Codominant vertebral arteries that are tortuous but smoothly contoured and widely patent. Skeleton: Generalized degenerative disc narrowing and ridging. Other neck: 29 x 18 mm left thyroid nodule is size stable. Upper chest: Negative Review of the MIP images confirms the above findings CTA HEAD FINDINGS Anterior circulation: Atherosclerotic plaque on the carotid siphons. No flow limiting stenosis or branch occlusion. Negative for beading. 2 x 3 mm left ICA aneurysm projecting medially and inferiorly from the the paraclinoid segment. In retrospect no change from prior. The sac has a smooth contour. Posterior circulation: The vertebral and basilar arteries are smooth and widely patent. No branch occlusion or proximal stenosis. Negative for aneurysm. Venous sinuses: Diffusely patent Anatomic variants: None significant Review of the MIP images confirms the above findings IMPRESSION: 1. No emergent finding. 2. 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 No visible subarachnoid hemorrhage. 3. Atherosclerosis with up to 60% right ICA bulb stenosis. 4. Left lobe thyroid nodule with stable appearance compared to 2016 CTA. Thyroid ultrasound was performed in the same year. Electronically Signed   By: JMonte FantasiaM.D.   On: 02/02/2019 04:49   CT Angio Neck W and/or Wo Contrast  Result Date: 02/02/2019 CLINICAL DATA:  High blood pressure with headache and dizziness for a few days EXAM: CT ANGIOGRAPHY HEAD AND NECK TECHNIQUE: Multidetector CT imaging of the head and neck was performed using the standard protocol during bolus administration of intravenous contrast. Multiplanar CT image reconstructions and MIPs were obtained to evaluate the vascular anatomy. Carotid stenosis measurements (when applicable) are obtained utilizing NASCET criteria, using the distal internal carotid diameter as the  denominator. CONTRAST:  10102mOMNIPAQUE IOHEXOL 350 MG/ML SOLN COMPARISON:  11/24/2014 FINDINGS: CT HEAD FINDINGS Brain: No evidence of acute infarction, hemorrhage, hydrocephalus, extra-axial collection or mass lesion/mass effect. Vascular: See below Skull: Normal. Negative for fracture or focal lesion. Sinuses: Unremarkable Orbits: Bilateral cataract resection Review of the MIP images confirms the above findings CTA NECK FINDINGS Aortic arch: Low-density atheromatous plaque. No dilatation or acute finding. Three vessel branching. Right carotid system: Calcified plaque at the ICA bulb with up to 60% stenosis as measured on sagittal reformats. Partial retropharyngeal course of the ICA. Left carotid system: Moderate mixed density plaque at the ICA bulb without flow limiting stenosis or  ulceration. No vessel beading. Vertebral arteries: Proximal subclavian atherosclerosis that is mild. Codominant vertebral arteries that are tortuous but smoothly contoured and widely patent. Skeleton: Generalized degenerative disc narrowing and ridging. Other neck: 29 x 18 mm left thyroid nodule is size stable. Upper chest: Negative Review of the MIP images confirms the above findings CTA HEAD FINDINGS Anterior circulation: Atherosclerotic plaque on the carotid siphons. No flow limiting stenosis or branch occlusion. Negative for beading. 2 x 3 mm left ICA aneurysm projecting medially and inferiorly from the the paraclinoid segment. In retrospect no change from prior. The sac has a smooth contour. Posterior circulation: The vertebral and basilar arteries are smooth and widely patent. No branch occlusion or proximal stenosis. Negative for aneurysm. Venous sinuses: Diffusely patent Anatomic variants: None significant Review of the MIP images confirms the above findings IMPRESSION: 1. No emergent finding. 2. 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 No visible subarachnoid hemorrhage. 3. Atherosclerosis with up to 60% right  ICA bulb stenosis. 4. Left lobe thyroid nodule with stable appearance compared to 2016 CTA. Thyroid ultrasound was performed in the same year. Electronically Signed   By: Monte Fantasia M.D.   On: 02/02/2019 04:49    Procedures Procedures (including critical care time)  Medications Ordered in ED Medications  metoCLOPramide (REGLAN) injection 10 mg (10 mg Intravenous Given 02/02/19 0349)  diphenhydrAMINE (BENADRYL) injection 25 mg (25 mg Intravenous Given 02/02/19 0347)  iohexol (OMNIPAQUE) 350 MG/ML injection 100 mL (100 mLs Intravenous Contrast Given 02/02/19 0411)    ED Course  I have reviewed the triage vital signs and the nursing notes.  Pertinent labs & imaging results that were available during my care of the patient were reviewed by me and considered in my medical decision making (see chart for details).    MDM Rules/Calculators/A&P                      4:22 AM Due to hypertension, reported left-sided headache and neck pain, will proceed with CT angio head and neck.  Patient has had this previously in 2016.  Neurology notes from 2016  revealed she had similar symptoms at that time, also underwent negative temporal artery biopsy 6:47 AM CT imaging did show 60% ICA atherosclerosis Also shows thyroid nodule, this has been evaluated before by ultrasound and biopsy per chart Patient also noted to have a left ICA aneurysm Patient reports feeling improved.  She now reports that the headache was gradual over several days and then worsened.  Denies sudden onset of headache.  She reports headache is now similar to prior migraines on the left side. No Evidence of SAH on CT imaging, and given her clinical appearance low suspicion for [facility] Patient has been ambulatory. No focal neuro deficit I feel she is appropriate discharge home. To be referred to neurosurgery for evaluation of her aneurysm. Discussed at length strict ER return precautions  We discussed at length strict emergency  department return precautions.  These were also listed in the discharge instructions. Teach back was utilized at the end of our discussion.    Final Clinical Impression(s) / ED Diagnoses Final diagnoses:  Essential hypertension  Other headache syndrome  Aneurysm [facility])    Rx / DC Orders ED Discharge Orders    None       Ripley Fraise, MD 02/02/19 [phone]

## 2019-02-15 ENCOUNTER — Ambulatory Visit (INDEPENDENT_AMBULATORY_CARE_PROVIDER_SITE_OTHER): Payer: Medicare HMO

## 2019-02-15 ENCOUNTER — Other Ambulatory Visit: Payer: Self-pay

## 2019-02-15 DIAGNOSIS — J4550 Severe persistent asthma, uncomplicated: Secondary | ICD-10-CM

## 2019-03-05 ENCOUNTER — Other Ambulatory Visit: Payer: Self-pay | Admitting: Allergy and Immunology

## 2019-03-19 ENCOUNTER — Other Ambulatory Visit: Payer: Self-pay | Admitting: Allergy and Immunology

## 2019-03-26 ENCOUNTER — Telehealth: Payer: Self-pay

## 2019-03-26 MED ORDER — OLOPATADINE HCL 0.2 % OP SOLN: 1.0000 [drp] | Freq: Every day | OPHTHALMIC | 5 refills | Status: DC | PRN

## 2019-03-26 NOTE — Telephone Encounter (Signed)
New prescription has been sent in as instructed.

## 2019-03-26 NOTE — Telephone Encounter (Signed)
Pharmacy does not have Pazeo in stock. May we try an alternative of possibly Patanol, Pataday or Lastacaft?

## 2019-03-26 NOTE — Addendum Note (Signed)
Addended by: Lucrezia Starch I on: 03/26/2019 10:50 AM   Modules accepted: Orders

## 2019-03-26 NOTE — Telephone Encounter (Signed)
Pataday one drop in each eye once a day as needed please

## 2019-03-29 ENCOUNTER — Ambulatory Visit (INDEPENDENT_AMBULATORY_CARE_PROVIDER_SITE_OTHER): Payer: Medicare HMO

## 2019-03-29 ENCOUNTER — Other Ambulatory Visit: Payer: Self-pay

## 2019-03-29 DIAGNOSIS — T63441D Toxic effect of venom of bees, accidental (unintentional), subsequent encounter: Secondary | ICD-10-CM | POA: Diagnosis not present

## 2019-04-08 ENCOUNTER — Telehealth: Payer: Self-pay

## 2019-04-08 ENCOUNTER — Encounter: Payer: Self-pay | Admitting: *Deleted

## 2019-04-08 ENCOUNTER — Ambulatory Visit (INDEPENDENT_AMBULATORY_CARE_PROVIDER_SITE_OTHER): Payer: Medicare HMO

## 2019-04-08 DIAGNOSIS — J309 Allergic rhinitis, unspecified: Secondary | ICD-10-CM | POA: Diagnosis not present

## 2019-04-08 NOTE — Telephone Encounter (Signed)
Flow sheet has been updated to reflect these changes. Called patient and informed. Patient verbalized understanding.

## 2019-04-08 NOTE — Telephone Encounter (Signed)
Patient called wanting to resume getting her allergy injections. She last received her injections three months ago on 01/13/19. She received .50 at that time. Her vials expire 05/26/19. Her currently schedule is weekly and every four weeks at .50. Please advise.

## 2019-04-08 NOTE — Telephone Encounter (Signed)
Restart immunotherapy at 0.05 mL maintenance file and utilize schedule see for advancement.

## 2019-04-08 NOTE — Progress Notes (Signed)
Immunotherapy   Patient Details  Name: Andrea Lowery MRN: [id number] Date of Birth: [birth year]  04/08/2019  Patient resumed allergy injections today per Dr. Neldon Mc. Patient to receive .57ml today and to continue with schedule C and maintanance dose of every 4 weeks at .50.   Cathi Roan 04/08/2019, 3:41 PM

## 2019-04-12 ENCOUNTER — Ambulatory Visit (INDEPENDENT_AMBULATORY_CARE_PROVIDER_SITE_OTHER): Payer: Medicare HMO

## 2019-04-12 ENCOUNTER — Other Ambulatory Visit: Payer: Self-pay

## 2019-04-12 DIAGNOSIS — J4550 Severe persistent asthma, uncomplicated: Secondary | ICD-10-CM | POA: Diagnosis not present

## 2019-04-14 NOTE — Progress Notes (Signed)
Vials exp 04-13-20

## 2019-04-15 ENCOUNTER — Ambulatory Visit (INDEPENDENT_AMBULATORY_CARE_PROVIDER_SITE_OTHER): Payer: Medicare HMO

## 2019-04-15 DIAGNOSIS — J309 Allergic rhinitis, unspecified: Secondary | ICD-10-CM

## 2019-04-19 DIAGNOSIS — J301 Allergic rhinitis due to pollen: Secondary | ICD-10-CM

## 2019-04-20 ENCOUNTER — Ambulatory Visit (INDEPENDENT_AMBULATORY_CARE_PROVIDER_SITE_OTHER): Payer: Medicare HMO | Admitting: *Deleted

## 2019-04-20 ENCOUNTER — Other Ambulatory Visit: Payer: Self-pay | Admitting: Allergy and Immunology

## 2019-04-20 DIAGNOSIS — J309 Allergic rhinitis, unspecified: Secondary | ICD-10-CM | POA: Diagnosis not present

## 2019-04-21 DIAGNOSIS — J3089 Other allergic rhinitis: Secondary | ICD-10-CM | POA: Diagnosis not present

## 2019-04-29 ENCOUNTER — Ambulatory Visit (INDEPENDENT_AMBULATORY_CARE_PROVIDER_SITE_OTHER): Payer: Medicare HMO

## 2019-04-29 DIAGNOSIS — J309 Allergic rhinitis, unspecified: Secondary | ICD-10-CM | POA: Diagnosis not present

## 2019-05-05 ENCOUNTER — Ambulatory Visit (INDEPENDENT_AMBULATORY_CARE_PROVIDER_SITE_OTHER): Payer: Medicare HMO

## 2019-05-05 DIAGNOSIS — J309 Allergic rhinitis, unspecified: Secondary | ICD-10-CM

## 2019-05-06 ENCOUNTER — Other Ambulatory Visit: Payer: Self-pay | Admitting: Allergy and Immunology

## 2019-05-10 ENCOUNTER — Ambulatory Visit (HOSPITAL_COMMUNITY)
Admission: EM | Admit: 2019-05-10 | Discharge: 2019-05-10 | Disposition: A | Discharge status: Home / Self Care | Payer: Medicare HMO | Attending: Family Medicine | Admitting: Family Medicine

## 2019-05-10 ENCOUNTER — Ambulatory Visit (HOSPITAL_COMMUNITY)
Admission: RE | Admit: 2019-05-10 | Discharge: 2019-05-10 | Disposition: A | Discharge status: Home / Self Care | Payer: Medicare HMO | Source: Ambulatory Visit | Attending: Family Medicine | Admitting: Family Medicine

## 2019-05-10 ENCOUNTER — Other Ambulatory Visit: Payer: Self-pay

## 2019-05-10 ENCOUNTER — Encounter (HOSPITAL_COMMUNITY): Payer: Self-pay | Admitting: Emergency Medicine

## 2019-05-10 DIAGNOSIS — M79609 Pain in unspecified limb: Secondary | ICD-10-CM | POA: Diagnosis not present

## 2019-05-10 DIAGNOSIS — M7989 Other specified soft tissue disorders: Secondary | ICD-10-CM

## 2019-05-10 DIAGNOSIS — R600 Localized edema: Secondary | ICD-10-CM | POA: Diagnosis not present

## 2019-05-10 DIAGNOSIS — M79662 Pain in left lower leg: Secondary | ICD-10-CM

## 2019-05-10 DIAGNOSIS — M79605 Pain in left leg: Secondary | ICD-10-CM | POA: Diagnosis present

## 2019-05-10 NOTE — Progress Notes (Signed)
Left lower extremity venous duplex completed. Refer to "CV Proc" under chart review to view preliminary results.  05/10/2019 4:08 PM Kelby Aline., MHA, RVT, RDCS, RDMS

## 2019-05-10 NOTE — ED Provider Notes (Signed)
[facility]    CSN: [id number] Arrival date & time: 05/10/19  1306      History   Chief Complaint Chief Complaint  Patient presents with  . Leg Swelling    HPI Andrea Lowery is a 79 y.o. female.   Patient is a 79 year old female past medical history of asthma, arthritis, diabetes, DVT, GERD, headache, hypertension, hypothyroidism, CHF.  She presents today with left lower leg swelling and calf tenderness.  This is been present x1 day.  Similar to previous with DVT.  Is not currently taking any blood thinners.  Denies any injury to the leg.  No recent long distance traveling.  Recent stress with loss of grandson.  Denies any chest pain, shortness of breath.  ROS per HPI      Past Medical History:  Diagnosis Date  . Arthritis   . Asthma   . Diabetes mellitus without complication ([place])    Diabetes II  . DVT (deep venous thrombosis) ([place])    "Years ago"  . Dyslipidemia   . GERD (gastroesophageal reflux disease)   . Headache    Migraine  . Hypertension   . Hypothyroidism   . LBBB (left bundle branch block)   . MGUS (monoclonal gammopathy of unknown significance)   . MGUS (monoclonal gammopathy of unknown significance)   . Migraine   . OSA on CPAP     Patient Active Problem List   Diagnosis Date Noted  . Elevated serum creatinine 06/05/2018  . Hyperglycemia 06/05/2018  . Arthralgia 08/02/2016  . Eczema 08/02/2016  . Hypokalemia 04/30/2016  . Impingement syndrome of left shoulder 04/05/2016  . Carotid artery disease ([place]) 01/19/2016  . Dyslipidemia 01/19/2016  . Acute severe exacerbation of asthma 01/10/2016  . 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 Noncompliance 11/20/2015  . Seborrheic dermatitis of scalp 11/20/2015  . History of lumbar laminectomy for spinal cord decompression 07/31/2015  . Candidal skin infection 06/14/2015  . Osteoporosis without current pathological fracture 05/30/2015  .  Chronic pain of right hip 05/02/2015  . Chronic right-sided low back pain with right-sided sciatica 03/31/2015  . Temporal arteritis ([place]) 11/07/2014  . Headache 11/07/2014  . Diabetes mellitus type 2, controlled ([place]) 11/07/2014  . Diastolic CHF ([place]) 40/98/1191  . Sacroiliac dysfunction 11/04/2014  . Trochanteric bursitis of right hip 11/04/2014  . Asthma 10/05/2014  . GERD (gastroesophageal reflux disease) 10/05/2014  . Allergic rhinitis 10/05/2014  . Hypothyroidism 09/21/2014  . Multinodular goiter 08/09/2014  . Disorder of sacroiliac joint 07/29/2014  . 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 Thyroid nodule 07/01/2014  . Hypersomnolence 04/16/2014  . Snoring 04/16/2014  . Anxiety 03/18/2014  . Preventive measure 01/25/2014  . Difficulty sleeping 12/28/2013  . Hyperlipidemia 12/27/2013  . Acute diastolic CHF (congestive heart failure), NYHA class 2 ([place]) 07/29/2013  . Bilateral leg edema 07/20/2013  . Edema 07/20/2013  . 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 Essential hypertension 07/01/2012  . LBBB (left bundle branch block) 07/01/2012  . DOE (dyspnea on exertion) 07/01/2012  . OSA on CPAP 07/01/2012  . Abnormal respiratory rate 07/01/2012    Past Surgical History:  Procedure Laterality Date  . ABDOMINAL HYSTERECTOMY  1969  . ARTERY BIOPSY N/A 11/07/2014   Procedure: BIOPSY TEMPORAL ARTERY;  Surgeon: Leta Baptist, MD;  Location: [place] [facility] [facility];  Service: ENT;  Laterality: N/A;  . CARPAL TUNNEL RELEASE Right   . CHOLECYSTECTOMY  1976  . LUMBAR LAMINECTOMY/DECOMPRESSION  MICRODISCECTOMY N/A 07/31/2015   Procedure: L4-5 Decompression, Possible Right L4-5 Microdiscectomy;  Surgeon: Marybelle Killings, MD;  Location: [facility];  Service: Orthopedics;  Laterality: N/A;  . NM MYOVIEW LTD  02/13/2010   No ischemia  . PLANTAR FASCIA RELEASE    . TONSILLECTOMY  1965  . US ECHOCARDIOGRAPHY  09/20/2008   borderline LVH,mild TR,AOV mildly  sclerotic w/ca+ of the leaflets    OB History   No obstetric history on file.      Home Medications    Prior to Admission medications   Medication Sig Start Date End Date Taking? Authorizing Provider  albuterol (VENTOLIN HFA) 108 (90 Base) MCG/ACT inhaler INHALE 2 PUFFS INTO THE LUNGS EVERY 4 HOURS AS NEEDED FOR WHEEZING OR SHORTNESS OF BREATH 04/20/19   Kozlow, Donnamarie Poag, MD  alendronate (FOSAMAX) 70 MG tablet Take 70 mg by mouth every Wednesday.  05/30/15   [provider]  Blood Glucose Monitoring Suppl (ACCU-CHEK AVIVA PLUS) w/Device KIT by Does not apply route. 05/12/14   [provider]  budesonide-formoterol (SYMBICORT) 160-4.5 MCG/ACT inhaler Inhale 2 puffs into the lungs twice daily. Rinse, gargle and spit after use. 11/10/18   Kozlow, Donnamarie Poag, MD  cetirizine (ZYRTEC) 10 MG tablet Take 1 tablet (10 mg total) by mouth daily. 11/10/18   Kozlow, Donnamarie Poag, MD  cycloSPORINE (RESTASIS) 0.05 % ophthalmic emulsion Place one drop into both eyes 2 (two) times daily. 11/20/15   [provider]  EPINEPHrine 0.3 mg/0.3 mL IJ SOAJ injection Use as directed for severe allergic reaction 08/31/18   Kozlow, Donnamarie Poag, MD  esomeprazole (NEXIUM) 40 MG capsule Take 1 capsule (40 mg total) by mouth 2 (two) times daily. 11/10/18   Kozlow, Donnamarie Poag, MD  FASENRA 30 MG/ML SOSY RX#2 INJECT 30MG (1 SYRINGE) SUBCUTANEOUSLY AT WEEK 8 THEN EVERY 8 WEEKS THEREAFTER 06/22/18   Kozlow, Donnamarie Poag, MD  fluticasone (FLONASE) 50 MCG/ACT nasal spray SHAKE LIQUID AND USE 2 SPRAYS IN EACH NOSTRIL DAILY 05/06/19   Kozlow, Donnamarie Poag, MD  gabapentin (NEURONTIN) 100 MG capsule TK 1 C PO HS X 3 NIGHTS THEN 2 C HS X 3 NIGHTS THEN 3 C HS 08/30/18   [provider]  ipratropium-albuterol (DUONEB) 0.5-2.5 (3) MG/3ML SOLN USE 1 VIAL IN NEBULIZER EVERY 4 TO 6 HOURS AS NEEDED FOR COUGHING WHEEZING SHORTNESS OF BREATH 10/03/17   Kozlow, Donnamarie Poag, MD  JARDIANCE 25 MG TABS tablet  11/06/18   [provider]  levothyroxine  (SYNTHROID, LEVOTHROID) 50 MCG tablet take 1 tablet by mouth once daily Patient taking differently: take 1 tablet (50 mcg) by mouth once daily 08/04/15   Renato Shin, MD  metoprolol succinate (TOPROL-XL) 25 MG 24 hr tablet Take 1 tablet (25 mg total) by mouth daily. 11/23/18   Hilty, Nadean Corwin, MD  montelukast (SINGULAIR) 10 MG tablet TAKE 1 TABLET(10 MG) BY MOUTH AT BEDTIME 05/06/19   Kozlow, Donnamarie Poag, MD  olmesartan-hydrochlorothiazide (BENICAR HCT) 40-12.5 MG tablet TK 1 T PO D 08/31/18   [provider]  Olopatadine HCl (PATADAY) 0.2 % SOLN Place 1 drop into both eyes daily as needed. 03/26/19   Dara Hoyer, FNP  Potassium Chloride ER 20 MEQ TBCR  07/01/18   [provider]  potassium chloride SA (K-DUR) 20 MEQ tablet take 1 tablet by mouth once daily 03/10/17   [provider]  predniSONE (DELTASONE) 50 MG tablet Take 1 tablet (50 mg total) by mouth daily with breakfast. 01/26/19  Tasia Catchings, Amy V, PA-C  rosuvastatin (CRESTOR) 40 MG tablet Take 1 tablet (40 mg total) by mouth daily. 11/23/18   Pixie Casino, MD  sertraline (ZOLOFT) 25 MG tablet  06/30/18   [provider]  topiramate (TOPAMAX) 50 MG tablet TAKE 1/2 TABLET BY MOUTH DAILY THEN IN 1 WEEK INCREASE TO 1 DAILY 07/01/18   [provider]    Family History Family History  Problem Relation Age of Onset  . Diabetes Father   . Heart failure Father   . Hypertension Father   . Thyroid disease Sister   . Hypertension Sister     Social History Social History   Tobacco Use  . Smoking status: Former Smoker    Quit date: 02/04/2006    Years since quitting: 13.2  . Smokeless tobacco: Never Used  Substance Use Topics  . Alcohol use: No    Alcohol/week: 0.0 standard drinks  . Drug use: No     Allergies   Bee venom and Penicillins   Review of Systems Review of Systems   Physical Exam Triage Vital Signs ED Triage Vitals  Enc Vitals Group     BP 05/10/19 1434 (!) 185/91     Pulse Rate  05/10/19 1434 73     Resp 05/10/19 1434 18     Temp 05/10/19 1434 98.5 F (36.9 C)     Temp Source 05/10/19 1434 Oral     SpO2 05/10/19 1434 98 %     Weight --      Height --      Head Circumference --      Peak Flow --      Pain Score 05/10/19 1435 5     Pain Loc --      Pain Edu? --      Excl. in [place]? --    No data found.  Updated Vital Signs BP (!) 185/91 (BP Location: Right Arm)   Pulse 73   Temp 98.5 F (36.9 C) (Oral)   Resp 18   SpO2 98%   Visual Acuity Right Eye Distance:   Left Eye Distance:   Bilateral Distance:    Right Eye Near:   Left Eye Near:    Bilateral Near:     Physical Exam Vitals and nursing note reviewed.  Constitutional:      General: She is not in acute distress.    Appearance: Normal appearance. She is not ill-appearing, toxic-appearing or diaphoretic.  HENT:     Head: Normocephalic.     Nose: Nose normal.     Mouth/Throat:     Pharynx: Oropharynx is clear.  Eyes:     Conjunctiva/sclera: Conjunctivae normal.  Pulmonary:     Effort: Pulmonary effort is normal.  Abdominal:     Palpations: Abdomen is soft.     Tenderness: There is no abdominal tenderness.  Musculoskeletal:        General: Tenderness present. Normal range of motion.     Cervical back: Normal range of motion.     Left lower leg: Edema present.     Comments: Calf tenderness with swelling No increased warmth.   Skin:    General: Skin is warm and dry.     Findings: No rash.  Neurological:     Mental Status: She is alert.  Psychiatric:        Mood and Affect: Mood normal.      UC Treatments / Results  Labs (all labs ordered are listed, but only abnormal results  are displayed) Labs Reviewed - No data to display  EKG   Radiology No results found.  Procedures Procedures (including critical care time)  Medications Ordered in UC Medications - No data to display  Initial Impression / Assessment and Plan / UC Course  I have reviewed the triage vital signs  and the nursing notes.  Pertinent labs & imaging results that were available during my care of the patient were reviewed by me and considered in my medical decision making (see chart for details).     Leg swelling.  Based on symptoms and history we will go ahead and send for ultrasound rule out DVT. Final Clinical Impressions(s) / UC Diagnoses   Final diagnoses:  Leg swelling     Discharge Instructions     Go to the main entrance for an ultrasound of your leg    ED Prescriptions    None     PDMP not reviewed this encounter.   Orvan July, NP 05/10/19 1512

## 2019-05-10 NOTE — ED Triage Notes (Signed)
Pt here for left leg swelling and pain in back of calf area x 1 day; pt sts issues with BP x 1 week since her grandson was killed in an accident

## 2019-05-10 NOTE — Discharge Instructions (Signed)
Go to the main entrance for an ultrasound of your leg

## 2019-05-11 ENCOUNTER — Other Ambulatory Visit: Payer: Self-pay | Admitting: Allergy and Immunology

## 2019-05-11 ENCOUNTER — Ambulatory Visit (INDEPENDENT_AMBULATORY_CARE_PROVIDER_SITE_OTHER): Payer: Medicare HMO

## 2019-05-11 DIAGNOSIS — J309 Allergic rhinitis, unspecified: Secondary | ICD-10-CM | POA: Diagnosis not present

## 2019-05-12 ENCOUNTER — Other Ambulatory Visit: Payer: Self-pay | Admitting: Allergy and Immunology

## 2019-05-12 MED ORDER — CETIRIZINE HCL 10 MG PO TABS: 10.0000 mg | ORAL_TABLET | Freq: Every day | ORAL | 0 refills | Status: DC

## 2019-05-12 NOTE — Telephone Encounter (Signed)
Patient requesting a refill for Zyrtec. [facility] on [address].

## 2019-05-13 NOTE — Progress Notes (Signed)
Vials not needed yet 

## 2019-05-24 ENCOUNTER — Ambulatory Visit (INDEPENDENT_AMBULATORY_CARE_PROVIDER_SITE_OTHER): Payer: Medicare HMO

## 2019-05-24 ENCOUNTER — Other Ambulatory Visit: Payer: Self-pay

## 2019-05-24 DIAGNOSIS — T63441D Toxic effect of venom of bees, accidental (unintentional), subsequent encounter: Secondary | ICD-10-CM | POA: Diagnosis not present

## 2019-05-31 ENCOUNTER — Telehealth: Payer: Self-pay

## 2019-05-31 NOTE — Telephone Encounter (Signed)
Patient called stating she got a letter saying  Andrea Lowery needs a prior authorization. I told her I would send a message to you and once we get a response from her insurance we will give her a call back.

## 2019-06-02 NOTE — Telephone Encounter (Signed)
Approval obtained and spoke to [place]. They will reach out patient to get approval to ship. then call our office to schedule delivery.  I tried to reach patient but no answer and unable to leave message. If we do not get shipment patient will need to reschedule

## 2019-06-07 ENCOUNTER — Ambulatory Visit: Payer: Self-pay

## 2019-06-09 ENCOUNTER — Other Ambulatory Visit: Payer: Self-pay | Admitting: Allergy and Immunology

## 2019-06-10 ENCOUNTER — Ambulatory Visit (INDEPENDENT_AMBULATORY_CARE_PROVIDER_SITE_OTHER): Payer: Medicare HMO

## 2019-06-10 ENCOUNTER — Telehealth: Payer: Self-pay

## 2019-06-10 ENCOUNTER — Other Ambulatory Visit: Payer: Self-pay

## 2019-06-10 ENCOUNTER — Inpatient Hospital Stay: Payer: Medicare HMO

## 2019-06-10 DIAGNOSIS — J4550 Severe persistent asthma, uncomplicated: Secondary | ICD-10-CM

## 2019-06-10 NOTE — Telephone Encounter (Signed)
Called to see if she could come in earlier 5/13 appt with Dr. Alvy Bimler. Her grandson just died in [place]. She canceled appts and will call back to reschedule.

## 2019-06-14 ENCOUNTER — Ambulatory Visit (INDEPENDENT_AMBULATORY_CARE_PROVIDER_SITE_OTHER): Payer: Medicare HMO

## 2019-06-14 DIAGNOSIS — J309 Allergic rhinitis, unspecified: Secondary | ICD-10-CM

## 2019-06-15 ENCOUNTER — Telehealth: Payer: Self-pay | Admitting: *Deleted

## 2019-06-15 NOTE — Telephone Encounter (Signed)
Patient came to my window and needs to get a handicap sticker form completed for her? If so how long would you like for the patient to have the sticker?

## 2019-06-15 NOTE — Telephone Encounter (Signed)
Maximal time frame

## 2019-06-15 NOTE — Telephone Encounter (Signed)
Disability placard form has been completed and signed by the provider. Form has been copied and original has been placed up front for the patient to pick up. Called patient and advised, patient verbalized understanding.

## 2019-06-17 ENCOUNTER — Ambulatory Visit (INDEPENDENT_AMBULATORY_CARE_PROVIDER_SITE_OTHER): Payer: Medicare HMO | Admitting: Surgery

## 2019-06-17 ENCOUNTER — Ambulatory Visit: Payer: Medicare HMO | Admitting: Hematology and Oncology

## 2019-06-17 ENCOUNTER — Encounter: Payer: Self-pay | Admitting: Surgery

## 2019-06-17 ENCOUNTER — Ambulatory Visit: Payer: Self-pay

## 2019-06-17 ENCOUNTER — Other Ambulatory Visit: Payer: Self-pay

## 2019-06-17 VITALS — BP 174/83 | HR 76 | Ht 65.0 in | Wt 192.8 lb

## 2019-06-17 DIAGNOSIS — M48062 Spinal stenosis, lumbar region with neurogenic claudication: Secondary | ICD-10-CM

## 2019-06-17 DIAGNOSIS — M5442 Lumbago with sciatica, left side: Secondary | ICD-10-CM | POA: Diagnosis not present

## 2019-06-17 NOTE — Progress Notes (Signed)
Office Visit Note   Patient: Andrea Lowery           Date of Birth: Jan 18, 1941           MRN: [id number] Visit Date: 06/17/2019              Requested by: Hayden Rasmussen, MD [address] [place],   [address] PCP: Hayden Rasmussen, MD   Assessment & Plan: Visit Diagnoses:  1. Acute left-sided low back pain with left-sided sciatica   2. Spinal stenosis of lumbar region with neurogenic claudication     Plan: With patient's worsening low back pain, left lower extremity radiculopathy and neurogenic claudication symptoms I will go ahead and order lumbar MRI with and without contrast.  Patient has history of diabetes so she was not given a Medrol Dosepak taper today.  Follow with Dr. Lorin Mercy after completion of her study to discuss results and further treatment options.  Follow-Up Instructions: Return in about 2 weeks (around 07/01/2019) for with dr yates to review lumbar mri scan.   Orders:  Orders Placed This Encounter  Procedures  . XR Lumbar Spine 2-3 Views  . MR Lumbar Spine W Wo Contrast   No orders of the defined types were placed in this encounter.     Procedures: No procedures performed   Clinical Data: No additional findings.   Subjective: Chief Complaint  Patient presents with  . Lower Back - Pain  . Left Leg - Pain    HPI 79 year old black female comes in today with complaints of worsening low back pain, left lower extremity radiculopathy and neurogenic claudication symptoms.  Patient is status post L4-5 decompression/microdiscectomy by Dr. Lorin Mercy July 31, 2015.  Patient has had off-and-on pain for a while but symptoms have been worse over the last 5 to 6 weeks.  She presented to the [facility] urgent care May 10, 2019 due to worsening left lower extremity pain.  She was sent for venous Doppler which was negative for DVT.  Leg pain has been more constant and radiates down to her foot and ankle.  No symptoms on the right side.  States that  when she is in a grocery store she does have to hold onto a cart leaning forward in order to help relieve some of her back and leg symptoms.  No complaints of bowel or bladder incontinence. Review of Systems No current cardiac pulmonary GI GU issues  Objective: Vital Signs: BP (!) 174/83   Pulse 76   Ht 5\' 5"  (1.651 m)   Wt 192 lb 12.8 oz (87.5 kg)   BMI 32.08 kg/m   Physical Exam HENT:     Head: Normocephalic and atraumatic.  Eyes:     Extraocular Movements: Extraocular movements intact.     Pupils: Pupils are equal, round, and reactive to light.  Pulmonary:     Effort: Pulmonary effort is normal. No respiratory distress.  Musculoskeletal:     Comments: Gait is antalgic.  Patient has left-sided lumbar paraspinal tenderness.  Positive left-sided notch tenderness.  Negative on the right side.  Negative logroll bilateral hips.  Positive left straight leg raise.  Neurovas intact.  No focal motor deficits.  Bilateral calves are nontender.  Neurological:     General: No focal deficit present.     Mental Status: She is alert and oriented to person, place, and time.  Psychiatric:        Mood and Affect: Mood normal.  Behavior: Behavior normal.     Ortho Exam  Specialty Comments:  No specialty comments available.  Imaging: No results found.   PMFS History: Patient Active Problem List   Diagnosis Date Noted  . Elevated serum creatinine 06/05/2018  . Hyperglycemia 06/05/2018  . Arthralgia 08/02/2016  . Eczema 08/02/2016  . Hypokalemia 04/30/2016  . Impingement syndrome of left shoulder 04/05/2016  . Carotid artery disease ([place]) 01/19/2016  . Dyslipidemia 01/19/2016  . Acute severe exacerbation of asthma 01/10/2016  . 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 Noncompliance 11/20/2015  . Seborrheic dermatitis of scalp 11/20/2015  . History of lumbar laminectomy for spinal cord decompression 07/31/2015  . Candidal skin  infection 06/14/2015  . Osteoporosis without current pathological fracture 05/30/2015  . Chronic pain of right hip 05/02/2015  . Chronic right-sided low back pain with right-sided sciatica 03/31/2015  . Temporal arteritis ([place]) 11/07/2014  . Headache 11/07/2014  . Diabetes mellitus type 2, controlled ([place]) 11/07/2014  . Diastolic CHF ([place]) [id number]  . Sacroiliac dysfunction 11/04/2014  . Trochanteric bursitis of right hip 11/04/2014  . Asthma 10/05/2014  . GERD (gastroesophageal reflux disease) 10/05/2014  . Allergic rhinitis 10/05/2014  . Hypothyroidism 09/21/2014  . Multinodular goiter 08/09/2014  . Disorder of sacroiliac joint 07/29/2014  . 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 Thyroid nodule 07/01/2014  . Hypersomnolence 04/16/2014  . Snoring 04/16/2014  . Anxiety 03/18/2014  . Preventive measure 01/25/2014  . Difficulty sleeping 12/28/2013  . Hyperlipidemia 12/27/2013  . Acute diastolic CHF (congestive heart failure), NYHA class 2 ([place]) 07/29/2013  . Bilateral leg edema 07/20/2013  . Edema 07/20/2013  . 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 Essential hypertension 07/01/2012  . LBBB (left bundle branch block) 07/01/2012  . DOE (dyspnea on exertion) 07/01/2012  . OSA on CPAP 07/01/2012  . Abnormal respiratory rate 07/01/2012   Past Medical History:  Diagnosis Date  . Arthritis   . Asthma   . Diabetes mellitus without complication ([place])    Diabetes II  . DVT (deep venous thrombosis) ([place])    "Years ago"  . Dyslipidemia   . GERD (gastroesophageal reflux disease)   . Headache    Migraine  . Hypertension   . Hypothyroidism   . LBBB (left bundle branch block)   . MGUS (monoclonal gammopathy of unknown significance)   . MGUS (monoclonal gammopathy of unknown significance)   . Migraine   . OSA on CPAP     Family History  Problem Relation Age of Onset  . Diabetes Father   . Heart failure Father    . Hypertension Father   . Thyroid disease Sister   . Hypertension Sister     Past Surgical History:  Procedure Laterality Date  . ABDOMINAL HYSTERECTOMY  1969  . ARTERY BIOPSY N/A 11/07/2014   Procedure: BIOPSY TEMPORAL ARTERY;  Surgeon: Leta Baptist, MD;  Location: [facility] [facility];  Service: ENT;  Laterality: N/A;  . CARPAL TUNNEL RELEASE Right   . CHOLECYSTECTOMY  1976  . LUMBAR LAMINECTOMY/DECOMPRESSION MICRODISCECTOMY N/A 07/31/2015   Procedure: L4-5 Decompression, Possible Right L4-5 Microdiscectomy;  Surgeon: Marybelle Killings, MD;  Location: [facility];  Service: Orthopedics;  Laterality: N/A;  . NM MYOVIEW LTD  02/13/2010   No ischemia  . PLANTAR FASCIA RELEASE    . TONSILLECTOMY  1965  . US ECHOCARDIOGRAPHY  09/20/2008   borderline LVH,mild TR,AOV mildly sclerotic w/ca+ of the leaflets   Social History  Occupational History  . Not on file  Tobacco Use  . Smoking status: Former Smoker    Quit date: 02/04/2006    Years since quitting: 13.3  . Smokeless tobacco: Never Used  Substance and Sexual Activity  . Alcohol use: No    Alcohol/week: 0.0 standard drinks  . Drug use: No  . Sexual activity: Not on file

## 2019-06-18 ENCOUNTER — Telehealth: Payer: Self-pay | Admitting: Orthopaedic Surgery

## 2019-06-18 NOTE — Telephone Encounter (Signed)
Called patient no answer and no answesing machine pickup     Will try calling again later    Need post MRI scheduling will be after after for after 07/15/2019

## 2019-06-21 ENCOUNTER — Telehealth: Payer: Self-pay | Admitting: Orthopaedic Surgery

## 2019-06-21 ENCOUNTER — Ambulatory Visit (INDEPENDENT_AMBULATORY_CARE_PROVIDER_SITE_OTHER): Payer: Medicare HMO

## 2019-06-21 DIAGNOSIS — J309 Allergic rhinitis, unspecified: Secondary | ICD-10-CM

## 2019-06-21 MED ORDER — ACETAMINOPHEN-CODEINE #3 300-30 MG PO TABS: 1.0000 | ORAL_TABLET | Freq: Two times a day (BID) | ORAL | 0 refills | Status: DC | PRN

## 2019-06-21 NOTE — Telephone Encounter (Signed)
Ok tylenol # 3   one po bid # 20  thanks ucall

## 2019-06-21 NOTE — Telephone Encounter (Signed)
Please advise 

## 2019-06-21 NOTE — Telephone Encounter (Signed)
Pt called stating she had an appt on 06/17/19 but she's still having pain in her back and it spreading down her left leg the pt is wondering if there's anything that could be prescribed to help?  [phone]

## 2019-06-21 NOTE — Telephone Encounter (Signed)
Called to pharmacy. Patient aware.

## 2019-06-29 ENCOUNTER — Ambulatory Visit (INDEPENDENT_AMBULATORY_CARE_PROVIDER_SITE_OTHER): Payer: Medicare HMO

## 2019-06-29 DIAGNOSIS — J309 Allergic rhinitis, unspecified: Secondary | ICD-10-CM | POA: Diagnosis not present

## 2019-07-06 ENCOUNTER — Other Ambulatory Visit: Payer: Self-pay

## 2019-07-06 ENCOUNTER — Ambulatory Visit (INDEPENDENT_AMBULATORY_CARE_PROVIDER_SITE_OTHER): Payer: Medicare HMO | Admitting: Orthopaedic Surgery

## 2019-07-06 ENCOUNTER — Ambulatory Visit (INDEPENDENT_AMBULATORY_CARE_PROVIDER_SITE_OTHER): Payer: Medicare HMO

## 2019-07-06 DIAGNOSIS — J309 Allergic rhinitis, unspecified: Secondary | ICD-10-CM

## 2019-07-06 DIAGNOSIS — Z5329 Procedure and treatment not carried out because of patient's decision for other reasons: Secondary | ICD-10-CM

## 2019-07-07 DIAGNOSIS — Z91199 Patient's noncompliance with other medical treatment and regimen due to unspecified reason: Secondary | ICD-10-CM | POA: Insufficient documentation

## 2019-07-07 DIAGNOSIS — Z5329 Procedure and treatment not carried out because of patient's decision for other reasons: Secondary | ICD-10-CM | POA: Insufficient documentation

## 2019-07-07 NOTE — Progress Notes (Signed)
No show

## 2019-07-09 ENCOUNTER — Other Ambulatory Visit: Payer: Self-pay

## 2019-07-09 ENCOUNTER — Other Ambulatory Visit: Payer: Self-pay | Admitting: Allergy and Immunology

## 2019-07-09 MED ORDER — MONTELUKAST SODIUM 10 MG PO TABS: ORAL_TABLET | ORAL | 1 refills | Status: DC

## 2019-07-09 MED ORDER — ALBUTEROL SULFATE HFA 108 (90 BASE) MCG/ACT IN AERS: INHALATION_SPRAY | RESPIRATORY_TRACT | 1 refills | Status: DC

## 2019-07-09 MED ORDER — FLUTICASONE PROPIONATE 50 MCG/ACT NA SUSP: NASAL | 5 refills | Status: DC

## 2019-07-13 ENCOUNTER — Ambulatory Visit (INDEPENDENT_AMBULATORY_CARE_PROVIDER_SITE_OTHER): Payer: Medicare HMO

## 2019-07-13 DIAGNOSIS — J309 Allergic rhinitis, unspecified: Secondary | ICD-10-CM

## 2019-07-15 ENCOUNTER — Other Ambulatory Visit: Payer: Self-pay | Admitting: Allergy and Immunology

## 2019-07-15 ENCOUNTER — Other Ambulatory Visit: Payer: Medicare HMO

## 2019-07-19 ENCOUNTER — Other Ambulatory Visit: Payer: Self-pay | Admitting: Family Medicine

## 2019-07-19 ENCOUNTER — Other Ambulatory Visit: Payer: Self-pay

## 2019-07-19 ENCOUNTER — Ambulatory Visit (INDEPENDENT_AMBULATORY_CARE_PROVIDER_SITE_OTHER): Payer: Medicare HMO

## 2019-07-19 DIAGNOSIS — T63441D Toxic effect of venom of bees, accidental (unintentional), subsequent encounter: Secondary | ICD-10-CM | POA: Diagnosis not present

## 2019-07-19 DIAGNOSIS — E2839 Other primary ovarian failure: Secondary | ICD-10-CM

## 2019-07-20 ENCOUNTER — Other Ambulatory Visit: Payer: Self-pay

## 2019-07-20 ENCOUNTER — Ambulatory Visit: Payer: Medicare HMO | Admitting: Orthopaedic Surgery

## 2019-07-20 MED ORDER — ALBUTEROL SULFATE HFA 108 (90 BASE) MCG/ACT IN AERS: INHALATION_SPRAY | RESPIRATORY_TRACT | 1 refills | Status: DC

## 2019-07-22 ENCOUNTER — Other Ambulatory Visit: Payer: Self-pay | Admitting: Allergy and Immunology

## 2019-07-22 DIAGNOSIS — J4550 Severe persistent asthma, uncomplicated: Secondary | ICD-10-CM

## 2019-07-22 DIAGNOSIS — I97131 Postprocedural heart failure following other surgery: Secondary | ICD-10-CM

## 2019-07-23 ENCOUNTER — Telehealth: Payer: Self-pay | Admitting: *Deleted

## 2019-07-23 NOTE — Telephone Encounter (Signed)
Pt called stating we are not doing our job by getting her scheduled for MRI and would like to know what the issue is. I talked to someone from the imaging place by secure chat and the following states:  ME: hey, the above pt keeps saying when she makes appt with you guys that someone is telling her there is no authorization, I just checked and she does have an [place] with you guys. Pt is irate with [place] stating we havent done our job. I see there is something documented that there is an authorization in the referral notes. Can you check this out please. thnaks pt has [place]: ill check on that right now and double check  we do have auth for her its [id number] for MRI of lumbar spine with and without and from the notes on her order they made a call attempt on 6/9 to set her up an appt and her vm is not set up is there a contact number you have where one of the schedulers can call her and schedule

## 2019-07-29 ENCOUNTER — Other Ambulatory Visit: Payer: Self-pay

## 2019-07-29 ENCOUNTER — Ambulatory Visit
Admission: RE | Admit: 2019-07-29 | Discharge: 2019-07-29 | Disposition: A | Discharge status: Home / Self Care | Payer: Medicare HMO | Source: Ambulatory Visit | Attending: Surgery | Admitting: Surgery

## 2019-07-29 DIAGNOSIS — M48062 Spinal stenosis, lumbar region with neurogenic claudication: Secondary | ICD-10-CM

## 2019-07-29 DIAGNOSIS — M5442 Lumbago with sciatica, left side: Secondary | ICD-10-CM

## 2019-07-29 MED ORDER — GADOBENATE DIMEGLUMINE 529 MG/ML IV SOLN
18.0000 mL | Freq: Once | INTRAVENOUS | Status: AC | PRN
  Administered 2019-07-29: 18 mL via INTRAVENOUS

## 2019-08-05 ENCOUNTER — Other Ambulatory Visit: Payer: Self-pay

## 2019-08-05 ENCOUNTER — Ambulatory Visit (INDEPENDENT_AMBULATORY_CARE_PROVIDER_SITE_OTHER): Payer: Medicare HMO

## 2019-08-05 DIAGNOSIS — J4550 Severe persistent asthma, uncomplicated: Secondary | ICD-10-CM | POA: Diagnosis not present

## 2019-08-10 ENCOUNTER — Ambulatory Visit (INDEPENDENT_AMBULATORY_CARE_PROVIDER_SITE_OTHER): Payer: Medicare HMO

## 2019-08-10 ENCOUNTER — Ambulatory Visit: Payer: Medicare HMO | Admitting: Orthopaedic Surgery

## 2019-08-10 DIAGNOSIS — J309 Allergic rhinitis, unspecified: Secondary | ICD-10-CM | POA: Diagnosis not present

## 2019-08-18 ENCOUNTER — Ambulatory Visit (INDEPENDENT_AMBULATORY_CARE_PROVIDER_SITE_OTHER): Payer: Medicare HMO | Admitting: Allergy

## 2019-08-18 ENCOUNTER — Encounter: Payer: Self-pay | Admitting: Allergy

## 2019-08-18 ENCOUNTER — Other Ambulatory Visit: Payer: Self-pay | Admitting: Allergy and Immunology

## 2019-08-18 ENCOUNTER — Other Ambulatory Visit: Payer: Self-pay

## 2019-08-18 VITALS — BP 126/72 | HR 79 | Temp 98.0°F | Resp 16

## 2019-08-18 DIAGNOSIS — J3089 Other allergic rhinitis: Secondary | ICD-10-CM

## 2019-08-18 DIAGNOSIS — K219 Gastro-esophageal reflux disease without esophagitis: Secondary | ICD-10-CM | POA: Diagnosis not present

## 2019-08-18 DIAGNOSIS — Z91038 Other insect allergy status: Secondary | ICD-10-CM

## 2019-08-18 DIAGNOSIS — Z91030 Bee allergy status: Secondary | ICD-10-CM | POA: Diagnosis not present

## 2019-08-18 DIAGNOSIS — J4551 Severe persistent asthma with (acute) exacerbation: Secondary | ICD-10-CM | POA: Diagnosis not present

## 2019-08-18 DIAGNOSIS — J302 Other seasonal allergic rhinitis: Secondary | ICD-10-CM | POA: Insufficient documentation

## 2019-08-18 MED ORDER — CETIRIZINE HCL 10 MG PO TABS: ORAL_TABLET | ORAL | 1 refills | Status: DC

## 2019-08-18 MED ORDER — MONTELUKAST SODIUM 10 MG PO TABS: ORAL_TABLET | ORAL | 1 refills | Status: DC

## 2019-08-18 MED ORDER — EPINEPHRINE 0.3 MG/0.3ML IJ SOAJ: INTRAMUSCULAR | 1 refills | Status: DC

## 2019-08-18 NOTE — Assessment & Plan Note (Signed)
   Continue with esomeprazole 40mg  once a day.

## 2019-08-18 NOTE — Progress Notes (Signed)
Follow Up Note  RE: Andrea Lowery MRN: [id number] DOB: 1940-07-01 Date of Office Visit: 08/18/2019  Referring provider: Hayden Rasmussen, MD Primary care provider: Hayden Rasmussen, MD  Chief Complaint: Cough  History of Present Illness: I had the pleasure of seeing Andrea Lowery for a follow up visit at the [facility] on 08/18/2019. She is a 79 y.o. 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 Her previous allergy office visit was on 11/10/2018 with Dr. Neldon Mc. Today is a new complaint visit of coughing and wheezing. Up to date with COVID-19 vaccine: yes  Asthma:  Patient has been having issues with coughing, wheezing, shortness of breath since Monday and worsening. Bringing up some yellowish phlegm. No sick contacts. No fevers or chills. Using albuterol 2-3 puffs three times a day and started Symbicort 142mg 3 puffs three times a day with some benefit.  Patient was around a dog last weekend and sleeping in the same room as well. She did clean and wash everything since the dog left.  No recent oral prednisone.   Currently on Fasenra every 8 weeks.   Allergic rhinitis: Itchy skin and itchy scalp. Taking zyrtec 135mdaily and montelukast 1052mt night. Using Flonase 2 sprays twice a day. No nosebleeds. Doing well with allergy injections.  Reflux: Taking Nexium daily with good benefit.  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 No stings since last visit.  Assessment and Plan: Andrea Lowery a 79 30o. female with: Acute severe exacerbation of asthma Asthma flare since Monday most likely due to dog exposure over the weekend. Denies sick contacts, fevers, chills. Started using albuterol and Symbicort with some benefit.  Today's spirometry showed: restriction with 23% improvement in FEV1 post bronchodilator treatment. Clinically feeling  improved. Physical exam - better air movement. . Start mucinex 1 tablet twice a day to break up the mucous. Take with plenty of water. No indication for antibiotics.  Prednisone 65m60mblet pack: 2 tablets given in office today. Take 2 more tablets before bed today.  Then take 2 tablets twice a day for 2 more days. Then take 2 tablets once a day for 1 day. Then take 1 tablet once a day for 1 day.  . Take duoneb nebulizer twice a day before using other inhalers for the next 7 days.  . Take Symbicort 160mc65mpuffs twice a day with spacer and rinse mouth afterwards for 2 weeks. . Daily controller medication(s): continue Fasenra injections every 8 weeks.  o Continue montelukast 65mg 43my.  . During upper respiratory infections/asthma flares: Start Symbicort 160mcg 59mffs twice a day with spacer and rinse mouth afterwards for 2 weeks. . May use albuterol rescue inhaler 2 puffs or nebulizer every 4 to 6 hours as needed for shortness of breath, chest tightness, coughing, and wheezing. May use albuterol rescue inhaler 2 puffs 5 to 15 minutes prior to strenuous physical activities. Monitor frequency of use.   Repeat spirometry at next visit.   Seasonal and perennial allergic rhinitis Stable with below regimen but noted some increased itching of her skin since dog exposure.   May use over the counter antihistamines such as Zyrtec (cetirizine) 65mg da42mas needed. May take twice a day during flares.   May use Flonase (fluticasone) nasal spray 1 spray per nostril twice a day as needed for nasal congestion.   Nasal saline spray (i.e., Simply Saline)  or nasal saline lavage (i.e., NeilMed) is recommended as needed and prior to medicated nasal sprays.  Continue allergy injections.   May use olopatadine eye drops 0.2% once a day as needed for itchy/watery eyes.  Continue environmental control measures - dust mites and tree pollen.  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  Continue venom  allergy injections.  I have refilled epinephrine injectable. For mild symptoms you can take over the counter antihistamines such as Benadryl and monitor symptoms closely. If symptoms worsen or if you have severe symptoms including breathing issues, throat closure, significant swelling, whole body hives, severe diarrhea and vomiting, lightheadedness then inject epinephrine and seek immediate medical care afterwards.  GERD (gastroesophageal reflux disease)  Continue with esomeprazole 62m once a day.  Return in about 6 months (around 02/18/2020).  Meds ordered this encounter  Medications  . montelukast (SINGULAIR) 10 MG tablet    Sig: TAKE 1 TABLET(10 MG) BY MOUTH AT BEDTIME    Dispense:  90 tablet    Refill:  1  . EPINEPHrine 0.3 mg/0.3 mL IJ SOAJ injection    Sig: Use as directed for severe allergic reaction    Dispense:  2 each    Refill:  1    Dispense MYLAN ONLY if generic is preferred  . cetirizine (ZYRTEC) 10 MG tablet    Sig: Take 1 tablet 1-2 times a day for allergies.    Dispense:  180 tablet    Refill:  1   Diagnostics: Spirometry:  Tracings reviewed. Her effort: It was hard to get consistent efforts and there is a question as to whether this reflects a maximal maneuver. FVC: 1.67L FEV1: 1.17L, 68% predicted FEV1/FVC ratio: 70% Interpretation: Spirometry consistent with possible restrictive disease with 23% improvement in FEV1 post bronchodilator treatment. Clinically feeling improved. Physical exam - better air movement.  Please see scanned spirometry results for details.  Medication List:  Current Outpatient Medications  Medication Sig Dispense Refill  . acetaminophen-codeine (TYLENOL #3) 300-30 MG tablet Take 1 tablet by mouth 2 (two) times daily as needed for moderate pain. 20 tablet 0  . albuterol (VENTOLIN HFA) 108 (90 Base) MCG/ACT inhaler INHALE 2 PUFFS INTO THE LUNGS EVERY 4 HOURS AS NEEDED FOR WHEEZING OR SHORTNESS OF BREATH 18 g 1  . alendronate (FOSAMAX) 70  MG tablet Take 70 mg by mouth every Wednesday.     . Blood Glucose Monitoring Suppl (ACCU-CHEK AVIVA PLUS) w/Device KIT by Does not apply route.    . budesonide-formoterol (SYMBICORT) 160-4.5 MCG/ACT inhaler INHALE 2 PUFFS BY MOUTH INTO THE LUNGS TWICE DAILY. RINSE, GARGLE AND SPIT AFTER USE 10.2 g 0  . cetirizine (ZYRTEC) 10 MG tablet Take 1 tablet 1-2 times a day for allergies. 180 tablet 1  . cycloSPORINE (RESTASIS) 0.05 % ophthalmic emulsion Place one drop into both eyes 2 (two) times daily.    .Marland KitchenEPINEPHrine 0.3 mg/0.3 mL IJ SOAJ injection Use as directed for severe allergic reaction 2 each 1  . esomeprazole (NEXIUM) 40 MG capsule Take 1 capsule (40 mg total) by mouth 2 (two) times daily. 180 capsule 1  . FASENRA 30 MG/ML SOSY INJECT 30MG SUBCUTANEOUSLY EVERY 8 WEEKS 1 mL 6  . fluticasone (FLONASE) 50 MCG/ACT nasal spray SHAKE LIQUID AND USE 2 SPRAYS IN EACH NOSTRIL DAILY 48 g 5  . gabapentin (NEURONTIN) 100 MG capsule TK 1 C PO HS X 3 NIGHTS THEN 2 C HS X 3 NIGHTS THEN 3 C HS    . ipratropium-albuterol (DUONEB)  0.5-2.5 (3) MG/3ML SOLN USE 1 VIAL IN NEBULIZER EVERY 4 TO 6 HOURS AS NEEDED FOR COUGHING WHEEZING SHORTNESS OF BREATH 180 mL 0  . JARDIANCE 25 MG TABS tablet     . levothyroxine (SYNTHROID, LEVOTHROID) 50 MCG tablet take 1 tablet by mouth once daily (Patient taking differently: take 1 tablet (50 mcg) by mouth once daily) 90 tablet 0  . metoprolol succinate (TOPROL-XL) 25 MG 24 hr tablet Take 1 tablet (25 mg total) by mouth daily. 90 tablet 3  . montelukast (SINGULAIR) 10 MG tablet TAKE 1 TABLET(10 MG) BY MOUTH AT BEDTIME 90 tablet 1  . olmesartan-hydrochlorothiazide (BENICAR HCT) 40-12.5 MG tablet TK 1 T PO D    . Olopatadine HCl (PATADAY) 0.2 % SOLN Place 1 drop into both eyes daily as needed. 2.5 mL 5  . Potassium Chloride ER 20 MEQ TBCR     . potassium chloride SA (K-DUR) 20 MEQ tablet take 1 tablet by mouth once daily    . predniSONE (DELTASONE) 50 MG tablet Take 1 tablet (50 mg  total) by mouth daily with breakfast. 5 tablet 0  . rosuvastatin (CRESTOR) 40 MG tablet Take 1 tablet (40 mg total) by mouth daily. 90 tablet 3  . sertraline (ZOLOFT) 25 MG tablet     . topiramate (TOPAMAX) 50 MG tablet TAKE 1/2 TABLET BY MOUTH DAILY THEN IN 1 WEEK INCREASE TO 1 DAILY     Current Facility-Administered Medications  Medication Dose Route Frequency Provider Last Rate Last Admin  . Benralizumab SOSY 30 mg  30 mg Subcutaneous Q8 Weeks Jiles Prows, MD   30 mg at 08/05/19 1053   Allergies: Allergies  Allergen Reactions  . Bee Venom Anaphylaxis    Yellow jackets Yellow jackets  . Penicillins Hives    Has patient had a PCN reaction causing immediate rash, facial/tongue/throat swelling, SOB or lightheadedness with hypotension: Yes Has patient had a PCN reaction causing severe rash involving mucus membranes or skin necrosis: No Has patient had a PCN reaction that required hospitalization No Has patient had a PCN reaction occurring within the last 10 years: No If all of the above answers are "NO", then may proceed with Cephalosporin use.    I reviewed her past medical history, social history, family history, and environmental history and no significant changes have been reported from her previous visit.  Review of Systems  Constitutional: Negative for appetite change, chills, fever and unexpected weight change.  HENT: Negative for congestion and rhinorrhea.   Eyes: Negative for itching.  Respiratory: Positive for cough, shortness of breath and wheezing. Negative for chest tightness.   Cardiovascular: Negative for chest pain.  Gastrointestinal: Negative for abdominal pain.  Genitourinary: Negative for difficulty urinating.  Skin: Negative for rash.  Allergic/Immunologic: Positive for environmental allergies.  Neurological: Negative for headaches.   Objective: BP 126/72   Pulse 79   Temp 98 F (36.7 C) (Temporal)   Resp 16   SpO2 97%  There is no height or weight on  file to calculate BMI. Physical Exam Vitals and nursing note reviewed.  Constitutional:      Appearance: Normal appearance. She is well-developed.  HENT:     Head: Normocephalic and atraumatic.     Right Ear: Tympanic membrane and external ear normal.     Left Ear: Tympanic membrane and external ear normal.     Nose: Nose normal.     Mouth/Throat:     Mouth: Mucous membranes are moist.     Pharynx:  Oropharynx is clear.  Eyes:     Conjunctiva/sclera: Conjunctivae normal.  Cardiovascular:     Rate and Rhythm: Normal rate and regular rhythm.     Heart sounds: Normal heart sounds. No murmur heard.  No friction rub. No gallop.   Pulmonary:     Effort: Pulmonary effort is normal.     Breath sounds: No wheezing, rhonchi or rales.     Comments: Decreased breath sounds throughout which improved after albuterol treatment. Musculoskeletal:     Cervical back: Neck supple.  Skin:    General: Skin is warm.     Findings: No rash.  Neurological:     Mental Status: She is alert and oriented to person, place, and time.  Psychiatric:        Behavior: Behavior normal.    Previous notes and tests were reviewed. The plan was reviewed with the patient/family, and all questions/concerned were addressed.  It was my pleasure to see Jeorgia today and participate in her care. Please feel free to contact me with any questions or concerns.  Sincerely,  Rexene Alberts, DO Allergy & Immunology  [facility] office: [phone] [facility] office: [place] office: [phone]

## 2019-08-18 NOTE — Assessment & Plan Note (Signed)
Asthma flare since Monday most likely due to dog exposure over the weekend. Denies sick contacts, fevers, chills. Started using albuterol and Symbicort with some benefit.  Today's spirometry showed: restriction with 23% improvement in FEV1 post bronchodilator treatment. Clinically feeling improved. Physical exam - better air movement. . Start mucinex 1 tablet twice a day to break up the mucous. Take with plenty of water. No indication for antibiotics.  Prednisone 10mg  tablet pack: 2 tablets given in office today. Take 2 more tablets before bed today.  Then take 2 tablets twice a day for 2 more days. Then take 2 tablets once a day for 1 day. Then take 1 tablet once a day for 1 day.  . Take duoneb nebulizer twice a day before using other inhalers for the next 7 days.  . Take Symbicort 169mcg 2 puffs twice a day with spacer and rinse mouth afterwards for 2 weeks. . Daily controller medication(s): continue Fasenra injections every 8 weeks.  o Continue montelukast 10mg  daily.  . During upper respiratory infections/asthma flares: Start Symbicort 14mcg 2 puffs twice a day with spacer and rinse mouth afterwards for 2 weeks. . May use albuterol rescue inhaler 2 puffs or nebulizer every 4 to 6 hours as needed for shortness of breath, chest tightness, coughing, and wheezing. May use albuterol rescue inhaler 2 puffs 5 to 15 minutes prior to strenuous physical activities. Monitor frequency of use.   Repeat spirometry at next visit.

## 2019-08-18 NOTE — Assessment & Plan Note (Signed)
No stings since last visit.  Continue venom allergy injections.  I have refilled epinephrine injectable. For mild symptoms you can take over the counter antihistamines such as Benadryl and monitor symptoms closely. If symptoms worsen or if you have severe symptoms including breathing issues, throat closure, significant swelling, whole body hives, severe diarrhea and vomiting, lightheadedness then inject epinephrine and seek immediate medical care afterwards.

## 2019-08-18 NOTE — Assessment & Plan Note (Signed)
Stable with below regimen but noted some increased itching of her skin since dog exposure.   May use over the counter antihistamines such as Zyrtec (cetirizine) 10mg  daily as needed. May take twice a day during flares.   May use Flonase (fluticasone) nasal spray 1 spray per nostril twice a day as needed for nasal congestion.   Nasal saline spray (i.e., Simply Saline) or nasal saline lavage (i.e., NeilMed) is recommended as needed and prior to medicated nasal sprays.  Continue allergy injections.   May use olopatadine eye drops 0.2% once a day as needed for itchy/watery eyes.  Continue environmental control measures - dust mites and tree pollen.

## 2019-08-18 NOTE — Patient Instructions (Addendum)
Asthma: . Start mucinex 1 tablet twice a day to break up the mucous. Take with plenty of water.  Prednisone 10mg  tablet pack: 2 tablets given in office today. Take 2 more tablets before bed today.  Then take 2 tablets twice a day for 2 more days. Then take 2 tablets once a day for 1 day. Then take 1 tablet once a day for 1 day.   . Take duoneb nebulizer twice a day before using your other inhalers for the next 7 days.  . Take Symbicort 136mcg 2 puffs twice a day with spacer and rinse mouth afterwards for 2 weeks. .  . Daily controller medication(s): continue Fasenra injections every 8 weeks.  o Continue montelukast 10mg  daily.  . During upper respiratory infections/asthma flares: Start Symbicort 1110mcg 2 puffs twice a day with spacer and rinse mouth afterwards for 2 weeks. . May use albuterol rescue inhaler 2 puffs or nebulizer every 4 to 6 hours as needed for shortness of breath, chest tightness, coughing, and wheezing. May use albuterol rescue inhaler 2 puffs 5 to 15 minutes prior to strenuous physical activities. Monitor frequency of use.  . Asthma control goals:  o Full participation in all desired activities (may need albuterol before activity) o Albuterol use two times or less a week on average (not counting use with activity) o Cough interfering with sleep two times or less a month o Oral steroids no more than once a year o No hospitalizations  Allergic rhinitis:  May use over the counter antihistamines such as Zyrtec (cetirizine) 10mg  daily as needed. May take twice a day during flares.   May use Flonase (fluticasone) nasal spray 1 spray per nostril twice a day as needed for nasal congestion.   Nasal saline spray (i.e., Simply Saline) or nasal saline lavage (i.e., NeilMed) is recommended as needed and prior to medicated nasal sprays.  Continue allergy injections.   May use olopatadine eye drops 0.2% once a day as needed for itchy/watery eyes.  Continue environmental control  measures - dust mites and tree pollen.  GERD:  Continue with esomeprazole 40mg  once a day.  Bee allergy:  Continue allergy injections.  I have refilled epinephrine injectable. For mild symptoms you can take over the counter antihistamines such as Benadryl and monitor symptoms closely. If symptoms worsen or if you have severe symptoms including breathing issues, throat closure, significant swelling, whole body hives, severe diarrhea and vomiting, lightheadedness then inject epinephrine and seek immediate medical care afterwards.  Follow up in 6 months or sooner if needed.   Control of House Dust Mite Allergen . Dust mite allergens are a common trigger of allergy and asthma symptoms. While they can be found throughout the house, these microscopic creatures thrive in warm, humid environments such as bedding, upholstered furniture and carpeting. . Because so much time is spent in the bedroom, it is essential to reduce mite levels there.  . Encase pillows, mattresses, and box springs in special allergen-proof fabric covers or airtight, zippered plastic covers.  . Bedding should be washed weekly in hot water (130 F) and dried in a hot dryer. Allergen-proof covers are available for comforters and pillows that can't be regularly washed.  Wendee Copp the allergy-proof covers every few months. Minimize clutter in the bedroom. Keep pets out of the bedroom.  Marland Kitchen Keep humidity less than 50% by using a dehumidifier or air conditioning. You can buy a humidity measuring device called a hygrometer to monitor this.  . If possible, replace carpets  with hardwood, linoleum, or washable area rugs. If that's not possible, vacuum frequently with a vacuum that has a HEPA filter. . Remove all upholstered furniture and non-washable window drapes from the bedroom. . Remove all non-washable stuffed toys from the bedroom.  Wash stuffed toys weekly.  Reducing Pollen Exposure . Pollen seasons: trees (spring), grass (summer) and  ragweed/weeds (fall). Marland Kitchen Keep windows closed in your home and car to lower pollen exposure.  Susa Simmonds air conditioning in the bedroom and throughout the house if possible.  . Avoid going out in dry windy days - especially early morning. . Pollen counts are highest between 5 - 10 AM and on dry, hot and windy days.  . Save outside activities for late afternoon or after a heavy rain, when pollen levels are lower.  . Avoid mowing of grass if you have grass pollen allergy. Marland Kitchen Be aware that pollen can also be transported indoors on people and pets.  . Dry your clothes in an automatic dryer rather than hanging them outside where they might collect pollen.  . Rinse hair and eyes before bedtime.

## 2019-08-24 ENCOUNTER — Ambulatory Visit: Payer: Medicare HMO | Admitting: Orthopaedic Surgery

## 2019-08-25 ENCOUNTER — Other Ambulatory Visit: Payer: Self-pay | Admitting: Radiology

## 2019-09-08 ENCOUNTER — Encounter: Payer: Self-pay | Admitting: Orthopaedic Surgery

## 2019-09-08 ENCOUNTER — Ambulatory Visit (INDEPENDENT_AMBULATORY_CARE_PROVIDER_SITE_OTHER): Payer: Medicare HMO | Admitting: Orthopaedic Surgery

## 2019-09-08 DIAGNOSIS — M5126 Other intervertebral disc displacement, lumbar region: Secondary | ICD-10-CM

## 2019-09-08 DIAGNOSIS — M5442 Lumbago with sciatica, left side: Secondary | ICD-10-CM

## 2019-09-08 NOTE — Progress Notes (Signed)
Office Visit Note   Patient: Andrea Lowery           Date of Birth: 02/16/1940           MRN: [id number] Visit Date: 09/08/2019              Requested by: Hayden Rasmussen, MD [place],  [place] [id number] PCP: Hayden Rasmussen, MD   Assessment & Plan: Visit Diagnoses: left L4-5 disc protrusion  Plan: We reviewed the MRI scan where she had previous laminectomies.  Comparison MRI 06/10/2015.  Recommend proceeding with L4-5 epidural on the left where she is   symptomatic.. We discussed surgery options after decompression surgery microdiscectomy including fusion and she would like to proceed with epidural injection.  I can follow-up with her in a couple months after the injection. Follow-Up Instructions: No follow-ups on file.   Orders:  Orders Placed This Encounter  Procedures  . Ambulatory referral to Physical Medicine Rehab   No orders of the defined types were placed in this encounter.     Procedures: No procedures performed   Clinical Data: No additional findings.   Subjective: Chief Complaint  Patient presents with  . Lower Back - Follow-up    HPI 79 year old female returns with ongoing problems with bilateral buttocks pain more left than right leg pain with numbness.  She does have type 2 diabetes history of asthma diastolic heart failure and has had some problems with lower extremity swelling better with the support stockings.  Today we reviewed MRI scan and previous MRI scan 2017.  Review of Systems 14 point system update unchanged from 10/13/2018 office visit other than as mentioned HPI.   Objective: Vital Signs: BP (!) 143/86 (BP Location: Left Arm, Patient Position: Sitting, Cuff Size: Normal)   Pulse 75   Ht 5\' 5"  (1.651 m)   Wt 192 lb (87.1 kg)   BMI 31.95 kg/m   Physical Exam Constitutional:      Appearance: She is well-developed.  HENT:     Head: Normocephalic.     Right Ear: External ear normal.     Left Ear: External  ear normal.  Eyes:     Pupils: Pupils are equal, round, and reactive to light.  Neck:     Thyroid: No thyromegaly.     Trachea: No tracheal deviation.  Cardiovascular:     Rate and Rhythm: Normal rate.  Pulmonary:     Effort: Pulmonary effort is normal.  Abdominal:     Palpations: Abdomen is soft.  Skin:    General: Skin is warm and dry.  Neurological:     Mental Status: She is alert and oriented to person, place, and time.  Psychiatric:        Behavior: Behavior normal.     Ortho Exam well-healed lumbar incision.  She has some sciatic notch tenderness on the left negative logroll of the hips.  Anterior tib gastrocsoleus is intact.  Specialty Comments:  No specialty comments available.  Imaging: CLINICAL DATA:  Acute left-sided low back pain with left-sided sciatica. Spinal stenosis of lumbar region with neurogenic claudication. Worsening low back pain, left lower extremity radiculopathy and neurogenic claudication; low back pain, greater than 6 weeks; lumbar radiculopathy, greater than 6 weeks. Additional history provided by technologist: History of low back pain with left buttock and leg pain for months. Lumbar spine surgery 2018.  EXAM: MRI LUMBAR SPINE WITHOUT AND WITH CONTRAST  TECHNIQUE: Multiplanar and multiecho pulse sequences  of the lumbar spine were obtained without and with intravenous contrast.  CONTRAST:  61mL MULTIHANCE GADOBENATE DIMEGLUMINE 529 MG/ML IV SOLN  COMPARISON:  Lumbar spine radiographs 06/17/2019, lumbar spine MRI 06/10/2015  FINDINGS: Segmentation:  5 lumbar vertebrae.  Alignment: Straightening of the expected lumbar lordosis. No significant spondylolisthesis.  Vertebrae: Vertebral body height is maintained. There is somewhat prominent degenerative endplate irregularity at L4-L5 with mixed degenerative endplate marrow signal. This includes mild degenerative endplate edema. Mild edema is also present within the  posterior elements at L4 and L5, likely related to facet degenerative arthrosis.  Conus medullaris and cauda equina: Conus extends to the L1-L2 level. No signal abnormality within the visualized distal spinal cord.  Paraspinal and other soft tissues: No abnormality identified within included portions of the abdomen/retroperitoneum. Postsurgical changes to the lower dorsal lumbar paraspinal soft tissues.  Disc levels:  Unless otherwise stated, the level by level findings below have not significantly changed since prior MRI 06/10/2015.  Progressive multilevel disc degeneration. Most notably there is progressive severe disc degeneration at L4-L5.  T12-L1: No significant disc herniation or stenosis.  L1-L2: Mild facet arthrosis/ligamentum flavum hypertrophy. No significant disc herniation or stenosis.  L2-L3: Mild disc bulge. Mild facet arthrosis/ligamentum flavum hypertrophy. Minimal bilateral subarticular narrowing without frank nerve root impingement. Central canal patent. No significant foraminal stenosis.  L3-L4: Disc bulge with superimposed small central disc protrusion. Moderate facet arthrosis with ligamentum flavum hypertrophy. Progressive mild to moderate bilateral subarticular narrowing (greater on the right) with slight crowding of the descending L4 nerve roots. Progressive mild central canal stenosis. Mild left neural foraminal narrowing is unchanged.  L4-L5: Postoperative changes from interval laminotomies. Disc bulge with endplate spurring. Superimposed small right center to right subarticular disc extrusion with slight cranial migration. Additionally, there is a superimposed small left subarticular disc extrusion. Moderate facet arthrosis with advanced ligamentum flavum hypertrophy. The right center/subarticular disc extrusion contributes to severe right subarticular stenosis with encroachment upon the descending right L5 nerve root (series 11, image  32). The left subarticular disc extrusion contributes to severe left lateral recess stenosis with encroachment upon the descending left L5 nerve root (series 11, image 33). Moderate central canal stenosis. Moderate right neural foraminal narrowing.  L5-S1: Disc bulge. Progressive superimposed central/right subarticular disc protrusion. Moderate facet arthrosis with ligamentum flavum hypertrophy. Progressive severe bilateral subarticular stenosis with encroachment upon the bilateral descending S1 nerve roots (series 11, image 38). Unchanged mild central canal stenosis. No significant foraminal narrowing.  IMPRESSION: Lumbar spondylosis as outlined and most notably as follows.  At L4-L5, interval laminotomies since prior MRI 06/10/2015. Progressive advanced disc degeneration. Disc bulge with endplate spurring. Superimposed small right center to right subarticular disc extrusion with slight cranial migration. Additional superimposed small left subarticular disc extrusion. These herniations are new from prior MRI. Moderate facet arthrosis with advanced ligamentum flavum hypertrophy. Encroachment upon the bilateral descending L5 nerve roots. Moderate central canal stenosis. Moderate right neural foraminal narrowing.  At L5-S1, disc bulge. Superimposed progressive central/right subarticular disc protrusion. Moderate facet arthrosis with ligamentum flavum hypertrophy. Progressive severe bilateral subarticular stenosis with encroachment upon the bilateral descending S1 nerve roots. Unchanged mild central canal stenosis. No significant foraminal narrowing.  Additional sites of spinal canal or neural foraminal narrowing as above.   Electronically Signed   By: Kellie Simmering DO   On: 07/29/2019 15:07    PMFS History: Patient Active Problem List   Diagnosis Date Noted  . Protrusion of lumbar intervertebral disc 09/13/2019  . Seasonal and perennial  allergic rhinitis 08/18/2019   . 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  . No-show for appointment 07/07/2019  . Elevated serum creatinine 06/05/2018  . Hyperglycemia 06/05/2018  . Arthralgia 08/02/2016  . Eczema 08/02/2016  . Hypokalemia 04/30/2016  . Impingement syndrome of left shoulder 04/05/2016  . Carotid artery disease ([facility]) 01/19/2016  . Dyslipidemia 01/19/2016  . Acute severe exacerbation of asthma 01/10/2016  . 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 Noncompliance 11/20/2015  . Seborrheic dermatitis of scalp 11/20/2015  . History of lumbar laminectomy for spinal cord decompression 07/31/2015  . Candidal skin infection 06/14/2015  . Osteoporosis without current pathological fracture 05/30/2015  . Chronic pain of right hip 05/02/2015  . Chronic right-sided low back pain with right-sided sciatica 03/31/2015  . Temporal arteritis ([facility]) 11/07/2014  . Headache 11/07/2014  . Diabetes mellitus type 2, controlled ([facility]) 11/07/2014  . Diastolic CHF ([facility]) 81/85/6314  . Sacroiliac dysfunction 11/04/2014  . Trochanteric bursitis of right hip 11/04/2014  . Asthma 10/05/2014  . GERD (gastroesophageal reflux disease) 10/05/2014  . Allergic rhinitis 10/05/2014  . Hypothyroidism 09/21/2014  . Multinodular goiter 08/09/2014  . Disorder of sacroiliac joint 07/29/2014  . 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 Thyroid nodule 07/01/2014  . Hypersomnolence 04/16/2014  . Snoring 04/16/2014  . Anxiety 03/18/2014  . Preventive measure 01/25/2014  . Difficulty sleeping 12/28/2013  . Hyperlipidemia 12/27/2013  . Acute diastolic CHF (congestive heart failure), NYHA class 2 ([facility]) 07/29/2013  . Bilateral leg edema 07/20/2013  . Edema 07/20/2013  . 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 Essential hypertension 07/01/2012  . LBBB (left bundle branch block) 07/01/2012  . DOE (dyspnea on exertion) 07/01/2012  . OSA on CPAP  07/01/2012  . Abnormal respiratory rate 07/01/2012   Past Medical History:  Diagnosis Date  . Arthritis   . Asthma   . Diabetes mellitus without complication ([facility])    Diabetes II  . DVT (deep venous thrombosis) ([facility])    "Years ago"  . Dyslipidemia   . GERD (gastroesophageal reflux disease)   . Headache    Migraine  . Hypertension   . Hypothyroidism   . LBBB (left bundle branch block)   . MGUS (monoclonal gammopathy of unknown significance)   . MGUS (monoclonal gammopathy of unknown significance)   . Migraine   . OSA on CPAP     Family History  Problem Relation Age of Onset  . Diabetes Father   . Heart failure Father   . Hypertension Father   . Thyroid disease Sister   . Hypertension Sister     Past Surgical History:  Procedure Laterality Date  . ABDOMINAL HYSTERECTOMY  1969  . ARTERY BIOPSY N/A 11/07/2014   Procedure: BIOPSY TEMPORAL ARTERY;  Surgeon: Leta Baptist, MD;  Location: [facility];  Service: ENT;  Laterality: N/A;  . CARPAL TUNNEL RELEASE Right   . CHOLECYSTECTOMY  1976  . LUMBAR LAMINECTOMY/DECOMPRESSION MICRODISCECTOMY N/A 07/31/2015   Procedure: L4-5 Decompression, Possible Right L4-5 Microdiscectomy;  Surgeon: Marybelle Killings, MD;  Location: [facility];  Service: Orthopedics;  Laterality: N/A;  . NM MYOVIEW LTD  02/13/2010   No ischemia  . PLANTAR FASCIA RELEASE    . TONSILLECTOMY  1965  . US ECHOCARDIOGRAPHY  09/20/2008   borderline LVH,mild TR,AOV mildly sclerotic w/ca+ of the leaflets   Social History   Occupational History  . Not on file  Tobacco Use  . 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 Smokeless tobacco: Never Used  Substance and Sexual Activity  . Alcohol use: No    Alcohol/week: 0.0 standard drinks  . Drug use: No  . Sexual activity: Not on file

## 2019-09-13 ENCOUNTER — Ambulatory Visit (INDEPENDENT_AMBULATORY_CARE_PROVIDER_SITE_OTHER): Payer: Medicare HMO

## 2019-09-13 ENCOUNTER — Other Ambulatory Visit: Payer: Self-pay

## 2019-09-13 DIAGNOSIS — T63441D Toxic effect of venom of bees, accidental (unintentional), subsequent encounter: Secondary | ICD-10-CM

## 2019-09-13 DIAGNOSIS — M5126 Other intervertebral disc displacement, lumbar region: Secondary | ICD-10-CM | POA: Insufficient documentation

## 2019-09-15 ENCOUNTER — Ambulatory Visit (INDEPENDENT_AMBULATORY_CARE_PROVIDER_SITE_OTHER): Payer: Medicare HMO

## 2019-09-15 DIAGNOSIS — J309 Allergic rhinitis, unspecified: Secondary | ICD-10-CM | POA: Diagnosis not present

## 2019-09-15 DIAGNOSIS — J302 Other seasonal allergic rhinitis: Secondary | ICD-10-CM

## 2019-09-16 ENCOUNTER — Other Ambulatory Visit: Payer: Self-pay | Admitting: *Deleted

## 2019-09-16 NOTE — Telephone Encounter (Signed)
error 

## 2019-09-21 ENCOUNTER — Ambulatory Visit (INDEPENDENT_AMBULATORY_CARE_PROVIDER_SITE_OTHER): Payer: Medicare HMO | Admitting: Physical Medicine and Rehabilitation

## 2019-09-21 ENCOUNTER — Encounter: Payer: Self-pay | Admitting: Physical Medicine and Rehabilitation

## 2019-09-21 ENCOUNTER — Other Ambulatory Visit: Payer: Self-pay

## 2019-09-21 ENCOUNTER — Ambulatory Visit: Payer: Self-pay

## 2019-09-21 VITALS — BP 165/91 | HR 86

## 2019-09-21 DIAGNOSIS — M5416 Radiculopathy, lumbar region: Secondary | ICD-10-CM

## 2019-09-21 MED ORDER — METHYLPREDNISOLONE ACETATE 80 MG/ML IJ SUSP
80.0000 mg | Freq: Once | INTRAMUSCULAR | Status: AC
  Administered 2019-09-21: 80 mg

## 2019-09-21 NOTE — Progress Notes (Signed)
Pt states lower back pain that travel to her left side buttock down to the posterior left leg. Pt states walking and sitting for a long time makes the pain worse.    Numeric Pain Rating Scale and Functional Assessment Average Pain 3   In the last MONTH (on 0-10 scale) has pain interfered with the following?  1. General activity like being  able to carry out your everyday physical activities such as walking, climbing stairs, carrying groceries, or moving a chair?  Rating(8)   +Driver, -BT, -Dye Allergies.

## 2019-09-23 ENCOUNTER — Other Ambulatory Visit: Payer: Self-pay | Admitting: Physical Medicine and Rehabilitation

## 2019-09-23 ENCOUNTER — Telehealth: Payer: Self-pay | Admitting: Orthopaedic Surgery

## 2019-09-23 DIAGNOSIS — W19XXXA Unspecified fall, initial encounter: Secondary | ICD-10-CM

## 2019-09-23 DIAGNOSIS — S66912A Strain of unspecified muscle, fascia and tendon at wrist and hand level, left hand, initial encounter: Secondary | ICD-10-CM

## 2019-09-23 MED ORDER — HYDROCODONE-ACETAMINOPHEN 5-325 MG PO TABS: 1.0000 | ORAL_TABLET | Freq: Four times a day (QID) | ORAL | 0 refills | Status: DC | PRN

## 2019-09-23 NOTE — Procedures (Signed)
Lumbosacral Transforaminal Epidural Steroid Injection - Sub-Pedicular Approach with Fluoroscopic Guidance  Patient: Andrea Lowery      Date of Birth: 1940-04-11 MRN: [id number] PCP: Hayden Rasmussen, MD      Visit Date: 09/21/2019   Universal Protocol:    Date/Time: 09/21/2019  Consent Given By: the patient  Position: PRONE  Additional Comments: Vital signs were monitored before and after the procedure. Patient was prepped and draped in the usual sterile fashion. The correct patient, procedure, and site was verified.   Injection Procedure Details:  Procedure Site One Meds Administered:  Meds ordered this encounter  Medications  . methylPREDNISolone acetate (DEPO-MEDROL) injection 80 mg    Laterality: Left  Location/Site:  L4-L5  Needle size: 22 G  Needle type: Spinal  Needle Placement: Transforaminal  Findings:    -Comments: Excellent flow of contrast along the nerve, nerve root and into the epidural space.  Flow of contrast was very stenotic and the patient was having concordant pain at that point around the area which we did use a lot of numbing medicine at that time to try to get good flow of contrast and good needle placement.  Biplanar imaging was used to position the needle tip accurately.  Procedure Details: After squaring off the end-plates to get a true AP view, the C-arm was positioned so that an oblique view of the foramen as noted above was visualized. The target area is just inferior to the "nose of the scotty dog" or sub pedicular. The soft tissues overlying this structure were infiltrated with 2-3 ml. of 1% Lidocaine without Epinephrine.  The spinal needle was inserted toward the target using a "trajectory" view along the fluoroscope beam.  Under AP and lateral visualization, the needle was advanced so it did not puncture dura and was located close the 6 O'Clock position of the pedical in AP tracterory. Biplanar projections were used to confirm  position. Aspiration was confirmed to be negative for CSF and/or blood. A 1-2 ml. volume of Isovue-250 was injected and flow of contrast was noted at each level. Radiographs were obtained for documentation purposes.   After attaining the desired flow of contrast documented above, a 0.5 to 1.0 ml test dose of 0.25% Marcaine was injected into each respective transforaminal space.  The patient was observed for 90 seconds post injection.  After no sensory deficits were reported, and normal lower extremity motor function was noted,   the above injectate was administered so that equal amounts of the injectate were placed at each foramen (level) into the transforaminal epidural space.   Additional Comments:  Postprocedure the patient evidently did have some numbness and paresthesia in the left leg and upon standing did seem to crop down.  I did not witness the situation as I have left the room.  [name] was there the entire time and was watching the patient has this happen.  She did basically sit down towards the ground.  I was told that she did say her hand was bothering her little bit but basically she was okay at least close the word we had gotten.  She was discharged without any other issues.  Dressing: 2 x 2 sterile gauze and Band-Aid    Post-procedure details: Patient was observed during the procedure. Post-procedure instructions were reviewed.  Patient left the clinic in stable condition.

## 2019-09-23 NOTE — Progress Notes (Signed)
Pain rx sent for fall after procedure yesterday with increase pain today with leg and wrist. If not better in a few days please f/up with Dr. Lorin Mercy.

## 2019-09-23 NOTE — Telephone Encounter (Signed)
Please see below and advise.

## 2019-09-23 NOTE — Progress Notes (Signed)
Andrea Lowery - 79 y.o. female MRN [id number]  Date of birth: 1941/01/09  Office Visit Note: Visit Date: 09/21/2019 PCP: Hayden Rasmussen, MD Referred by: Hayden Rasmussen, MD  Subjective: Chief Complaint  Patient presents with  . Lower Back - Pain   HPI:  Andrea Lowery is a 79 y.o. female who comes in today at the request of Dr. Rodell Perna for planned Left L4-L5 Lumbar epidural steroid injection with fluoroscopic guidance.  The patient has failed conservative care including home exercise, medications, time and activity modification.  This injection will be diagnostic and hopefully therapeutic.  Please see requesting physician notes for further details and justification.  MRI reviewed with images and spine model.  MRI reviewed in the note below.  Patient with left posterior lateral leg pain somewhat more of an L5 and S1 distribution.  Clearly more changes at the L4-5 position with prior laminectomy laminotomy and continued disc extrusion both right and the left.  She also has facet arthropathy at L5-S1 with by lateral lateral recess stenosis that could affect the S1 nerve roots on either side.  ROS Otherwise per HPI.  Assessment & Plan: Visit Diagnoses:  1. Lumbar radiculopathy     Plan: No additional findings.   Meds & Orders:  Meds ordered this encounter  Medications  . methylPREDNISolone acetate (DEPO-MEDROL) injection 80 mg    Orders Placed This Encounter  Procedures  . XR C-ARM NO REPORT  . Epidural Steroid injection    Follow-up: Return for visit to requesting physician as needed.   Procedures: No procedures performed  Lumbosacral Transforaminal Epidural Steroid Injection - Sub-Pedicular Approach with Fluoroscopic Guidance  Patient: Andrea Lowery      Date of Birth: [birth year] MRN: [id number] PCP: Hayden Rasmussen, MD      Visit Date: 09/21/2019   Universal Protocol:    Date/Time: 09/21/2019  Consent Given By: the patient  Position:  PRONE  Additional Comments: Vital signs were monitored before and after the procedure. Patient was prepped and draped in the usual sterile fashion. The correct patient, procedure, and site was verified.   Injection Procedure Details:  Procedure Site One Meds Administered:  Meds ordered this encounter  Medications  . methylPREDNISolone acetate (DEPO-MEDROL) injection 80 mg    Laterality: Left  Location/Site:  L4-L5  Needle size: 22 G  Needle type: Spinal  Needle Placement: Transforaminal  Findings:    -Comments: Excellent flow of contrast along the nerve, nerve root and into the epidural space.  Flow of contrast was very stenotic and the patient was having concordant pain at that point around the area which we did use a lot of numbing medicine at that time to try to get good flow of contrast and good needle placement.  Biplanar imaging was used to position the needle tip accurately.  Procedure Details: After squaring off the end-plates to get a true AP view, the C-arm was positioned so that an oblique view of the foramen as noted above was visualized. The target area is just inferior to the "nose of the scotty dog" or sub pedicular. The soft tissues overlying this structure were infiltrated with 2-3 ml. of 1% Lidocaine without Epinephrine.  The spinal needle was inserted toward the target using a "trajectory" view along the fluoroscope beam.  Under AP and lateral visualization, the needle was advanced so it did not puncture dura and was located close the 6 O'Clock position of the pedical in AP tracterory. Biplanar projections were used  to confirm position. Aspiration was confirmed to be negative for CSF and/or blood. A 1-2 ml. volume of Isovue-250 was injected and flow of contrast was noted at each level. Radiographs were obtained for documentation purposes.   After attaining the desired flow of contrast documented above, a 0.5 to 1.0 ml test dose of 0.25% Marcaine was injected  into each respective transforaminal space.  The patient was observed for 90 seconds post injection.  After no sensory deficits were reported, and normal lower extremity motor function was noted,   the above injectate was administered so that equal amounts of the injectate were placed at each foramen (level) into the transforaminal epidural space.   Additional Comments:  Postprocedure the patient evidently did have some numbness and paresthesia in the left leg and upon standing did seem to crop down.  I did not witness the situation as I have left the room.  [name] was there the entire time and was watching the patient has this happen.  She did basically sit down towards the ground.  I was told that she did say her hand was bothering her little bit but basically she was okay at least close the word we had gotten.  She was discharged without any other issues.  Dressing: 2 x 2 sterile gauze and Band-Aid    Post-procedure details: Patient was observed during the procedure. Post-procedure instructions were reviewed.  Patient left the clinic in stable condition.      Clinical History: MRI LUMBAR SPINE WITHOUT AND WITH CONTRAST   CONTRAST:  64mL MULTIHANCE GADOBENATE DIMEGLUMINE 529 MG/ML IV SOLN  COMPARISON:  Lumbar spine radiographs 06/17/2019, lumbar spine MRI 06/10/2015  FINDINGS:  Vertebrae: Vertebral body height is maintained. There is somewhat prominent degenerative endplate irregularity at L4-L5 with mixed degenerative endplate marrow signal. This includes mild degenerative endplate edema. Mild edema is also present within the posterior elements at L4 and L5, likely related to facet degenerative arthrosis.   Paraspinal and other soft tissues: No abnormality identified within included portions of the abdomen/retroperitoneum. Postsurgical changes to the lower dorsal lumbar paraspinal soft tissues.  Disc levels:  Unless otherwise stated, the level by level findings  below have not significantly changed since prior MRI 06/10/2015.  Progressive multilevel disc degeneration. Most notably there is progressive severe disc degeneration at L4-L5.  T12-L1: No significant disc herniation or stenosis.  L1-L2: Mild facet arthrosis/ligamentum flavum hypertrophy. No significant disc herniation or stenosis.  L2-L3: Mild disc bulge. Mild facet arthrosis/ligamentum flavum hypertrophy. Minimal bilateral subarticular narrowing without frank nerve root impingement. Central canal patent. No significant foraminal stenosis.  L3-L4: Disc bulge with superimposed small central disc protrusion. Moderate facet arthrosis with ligamentum flavum hypertrophy. Progressive mild to moderate bilateral subarticular narrowing (greater on the right) with slight crowding of the descending L4 nerve roots. Progressive mild central canal stenosis. Mild left neural foraminal narrowing is unchanged.  L4-L5: Postoperative changes from interval laminotomies. Disc bulge with endplate spurring. Superimposed small right center to right subarticular disc extrusion with slight cranial migration. Additionally, there is a superimposed small left subarticular disc extrusion. Moderate facet arthrosis with advanced ligamentum flavum hypertrophy. The right center/subarticular disc extrusion contributes to severe right subarticular stenosis with encroachment upon the descending right L5 nerve root (series 11, image 32). The left subarticular disc extrusion contributes to severe left lateral recess stenosis with encroachment upon the descending left L5 nerve root (series 11, image 33). Moderate central canal stenosis. Moderate right neural foraminal narrowing.  L5-S1: Disc bulge. Progressive superimposed central/right  subarticular disc protrusion. Moderate facet arthrosis with ligamentum flavum hypertrophy. Progressive severe bilateral subarticular stenosis with encroachment upon the  bilateral descending S1 nerve roots (series 11, image 38). Unchanged mild central canal stenosis. No significant foraminal narrowing.  IMPRESSION: Lumbar spondylosis as outlined and most notably as follows.  At L4-L5, interval laminotomies since prior MRI 06/10/2015. Progressive advanced disc degeneration. Disc bulge with endplate spurring. Superimposed small right center to right subarticular disc extrusion with slight cranial migration. Additional superimposed small left subarticular disc extrusion. These herniations are new from prior MRI. Moderate facet arthrosis with advanced ligamentum flavum hypertrophy. Encroachment upon the bilateral descending L5 nerve roots. Moderate central canal stenosis. Moderate right neural foraminal narrowing.  At L5-S1, disc bulge. Superimposed progressive central/right subarticular disc protrusion. Moderate facet arthrosis with ligamentum flavum hypertrophy. Progressive severe bilateral subarticular stenosis with encroachment upon the bilateral descending S1 nerve roots. Unchanged mild central canal stenosis. No significant foraminal narrowing.  Additional sites of spinal canal or neural foraminal narrowing as above.   Electronically Signed   By: Kellie Simmering DO   On: 07/29/2019 15:07     Objective:  VS:  HT:    WT:   BMI:     BP:(!) 165/91  HR:86bpm  TEMP: ( )  RESP:  Physical Exam Constitutional:      General: She is not in acute distress.    Appearance: Normal appearance. She is not ill-appearing.  HENT:     Head: Normocephalic and atraumatic.     Right Ear: External ear normal.     Left Ear: External ear normal.  Eyes:     Extraocular Movements: Extraocular movements intact.  Cardiovascular:     Rate and Rhythm: Normal rate.     Pulses: Normal pulses.  Musculoskeletal:     Right lower leg: No edema.     Left lower leg: No edema.     Comments: Patient has good distal strength with no pain over the greater  trochanters.  No clonus or focal weakness.  Skin:    Findings: No erythema, lesion or rash.  Neurological:     General: No focal deficit present.     Mental Status: She is alert and oriented to person, place, and time.     Sensory: No sensory deficit.     Motor: No weakness or abnormal muscle tone.     Coordination: Coordination normal.  Psychiatric:        Mood and Affect: Mood normal.        Behavior: Behavior normal.      Imaging: No results found.

## 2019-09-23 NOTE — Telephone Encounter (Signed)
Have tried to call patient several times, each time line is busy.

## 2019-09-23 NOTE — Telephone Encounter (Signed)
I called and spoke with patient at length. She states she was standing after her injection and fell to the floor.  Reports she was assisted up the assistant.  Said that she asked to see Dr Ernestina Patches but was told he was in the rooms seeing another patient.  Said that her wrist and her hip/leg have been hurting her since the fall but are much worse today. I apologized multiple times to patient and offered patient an appointment for [place] to get some xrays but patient refused stating if she is not better by first of next week she will call.  She is requesting something for pain to be sent to [facility]. --Please also see note below.

## 2019-09-23 NOTE — Telephone Encounter (Signed)
Please talk to Andrea Lowery as this was not what I was told as the situation took place. Andrea Lowery was RIGHT there as it happened. She came to me afterward and I asked if they needed and she reported the patient was stating everything was ok. I would perfer she be evaluated by someone other than me if she needs attention but am happy to see her if needed. I will send medication in.

## 2019-09-23 NOTE — Telephone Encounter (Signed)
Discussed with [name].

## 2019-09-23 NOTE — Telephone Encounter (Signed)
Pt called stating she had an appt with Dr.Newton and he put her leg to sleep and when she was leaving she fell, pt states a nurse walked by her and didn't help. Pt states eventually either Jada or Chelsea walked past saw her and helped. She states she had hurt her hand and leg in the fall and would like some pain medicine for it. Pt also stated she doesn't want to see Dr.Newton anymore and she would like a CB from someone addressing this incident.  [phone]

## 2019-09-30 ENCOUNTER — Ambulatory Visit (INDEPENDENT_AMBULATORY_CARE_PROVIDER_SITE_OTHER): Payer: Medicare HMO | Admitting: *Deleted

## 2019-09-30 ENCOUNTER — Other Ambulatory Visit: Payer: Self-pay

## 2019-09-30 DIAGNOSIS — J4550 Severe persistent asthma, uncomplicated: Secondary | ICD-10-CM

## 2019-10-13 ENCOUNTER — Ambulatory Visit (INDEPENDENT_AMBULATORY_CARE_PROVIDER_SITE_OTHER): Payer: Medicare HMO | Admitting: Orthopaedic Surgery

## 2019-10-13 ENCOUNTER — Encounter: Payer: Self-pay | Admitting: Orthopaedic Surgery

## 2019-10-13 ENCOUNTER — Ambulatory Visit (INDEPENDENT_AMBULATORY_CARE_PROVIDER_SITE_OTHER): Payer: Medicare HMO

## 2019-10-13 VITALS — BP 179/91 | HR 85 | Ht 65.0 in | Wt 192.0 lb

## 2019-10-13 DIAGNOSIS — M5442 Lumbago with sciatica, left side: Secondary | ICD-10-CM

## 2019-10-13 NOTE — Progress Notes (Signed)
Office Visit Note   Patient: Andrea Lowery           Date of Birth: [birth year]           MRN: [id number] Visit Date: 10/13/2019              Requested by: Hayden Rasmussen, MD [address] [place],  [place] [address] PCP: Hayden Rasmussen, MD   Assessment & Plan: Visit Diagnoses:  1. Acute left-sided low back pain with left-sided sciatica     Plan: Patient is gotten some improvement in her back pain with the injection.  She states she will return if she is having increasing problems.  She still has some numbness in her toes but states the back pain is gotten some improvement with the epidural injection.  Follow-Up Instructions: Return if symptoms worsen or fail to improve.   Orders:  Orders Placed This Encounter  Procedures  . XR HIP UNILAT W OR W/O PELVIS 2-3 VIEWS LEFT   No orders of the defined types were placed in this encounter.     Procedures: No procedures performed   Clinical Data: No additional findings.   Subjective: Chief Complaint  Patient presents with  . Lower Back - Follow-up    HPI 79 year old female returns she had the epidural injection for disc protrusion at the L4-5 level where she had previous decompression surgery.  She states the injection was very painful she hollered out some extra numbing medicine was added and when she got up after the injection she states her leg was numb gave way and she fell.  She suffered contusion over the thenar region of the thumb with pain and tenderness improved.  She is also had pain in her left groin and hip region.  X-rays are obtained today.  Patient states the injection helped her back pain some.  The injection was painful and she was scared with her leg being numb and does not want to consider any further epidurals in the future.  No bowel bladder associated symptoms.  Review of Systems all other systems updated unchanged from last office visit with me other than as mentioned  HPI.   Objective: Vital Signs: BP (!) 179/91   Pulse 85   Ht 5\' 5"  (1.651 m)   Wt 192 lb (87.1 kg)   BMI 31.95 kg/m   Physical Exam Constitutional:      Appearance: She is well-developed.  HENT:     Head: Normocephalic.     Right Ear: External ear normal.     Left Ear: External ear normal.  Eyes:     Pupils: Pupils are equal, round, and reactive to light.  Neck:     Thyroid: No thyromegaly.     Trachea: No tracheal deviation.  Cardiovascular:     Rate and Rhythm: Normal rate.  Pulmonary:     Effort: Pulmonary effort is normal.  Abdominal:     Palpations: Abdomen is soft.  Skin:    General: Skin is warm and dry.  Neurological:     Mental Status: She is alert and oriented to person, place, and time.  Psychiatric:        Behavior: Behavior normal.     Ortho Exam well-healed lumbar incision patient has pain with internal rotation left hip 30 degrees and reaches to her gracilis region.  She has tenderness anteriorly over the hip joint and medial over the adductors.  No hip flexion weakness.  Palpation of the trochanter  is mildly tender.  Leg lengths are equal she ambulates with slight left hip limp negative Trendelenburg.  Specialty Comments:  No specialty comments available.  Imaging: XR HIP UNILAT W OR W/O PELVIS 2-3 VIEWS LEFT  Result Date: 10/13/2019 2 view x-rays left hip obtained and reviewed.  This shows normal pelvis femoral neck is intact no left hip arthritis is present. Impression: Left hip x-rays negative for fracture post fall.    PMFS History: Patient Active Problem List   Diagnosis Date Noted  . Protrusion of lumbar intervertebral disc 09/13/2019  . Seasonal and perennial allergic rhinitis 08/18/2019  . 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  . No-show for appointment 07/07/2019  . Elevated serum creatinine 06/05/2018  . Hyperglycemia 06/05/2018  . Arthralgia 08/02/2016  . Eczema 08/02/2016  . Hypokalemia 04/30/2016  . Impingement syndrome of left  shoulder 04/05/2016  . Carotid artery disease ([place]) 01/19/2016  . Dyslipidemia 01/19/2016  . Acute severe exacerbation of asthma 01/10/2016  . 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 Noncompliance 11/20/2015  . Seborrheic dermatitis of scalp 11/20/2015  . History of lumbar laminectomy for spinal cord decompression 07/31/2015  . Candidal skin infection 06/14/2015  . Osteoporosis without current pathological fracture 05/30/2015  . Chronic pain of right hip 05/02/2015  . Chronic right-sided low back pain with right-sided sciatica 03/31/2015  . Temporal arteritis ([place]) 11/07/2014  . Headache 11/07/2014  . Diabetes mellitus type 2, controlled ([place]) 11/07/2014  . Diastolic CHF ([place]) 78/46/9629  . Sacroiliac dysfunction 11/04/2014  . Trochanteric bursitis of right hip 11/04/2014  . Asthma 10/05/2014  . GERD (gastroesophageal reflux disease) 10/05/2014  . Allergic rhinitis 10/05/2014  . Hypothyroidism 09/21/2014  . Multinodular goiter 08/09/2014  . Disorder of sacroiliac joint 07/29/2014  . 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 Thyroid nodule 07/01/2014  . Hypersomnolence 04/16/2014  . Snoring 04/16/2014  . Anxiety 03/18/2014  . Preventive measure 01/25/2014  . Difficulty sleeping 12/28/2013  . Hyperlipidemia 12/27/2013  . Acute diastolic CHF (congestive heart failure), NYHA class 2 ([place]) 07/29/2013  . Bilateral leg edema 07/20/2013  . Edema 07/20/2013  . 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 Essential hypertension 07/01/2012  . LBBB (left bundle branch block) 07/01/2012  . DOE (dyspnea on exertion) 07/01/2012  . OSA on CPAP 07/01/2012  . Abnormal respiratory rate 07/01/2012   Past Medical History:  Diagnosis Date  . Arthritis   . Asthma   . Diabetes mellitus without complication ([place])    Diabetes II  . DVT (deep venous thrombosis) ([place])    "Years ago"  .  Dyslipidemia   . GERD (gastroesophageal reflux disease)   . Headache    Migraine  . Hypertension   . Hypothyroidism   . LBBB (left bundle branch block)   . MGUS (monoclonal gammopathy of unknown significance)   . MGUS (monoclonal gammopathy of unknown significance)   . Migraine   . OSA on CPAP     Family History  Problem Relation Age of Onset  . Diabetes Father   . Heart failure Father   . Hypertension Father   . Thyroid disease Sister   . Hypertension Sister     Past Surgical History:  Procedure Laterality Date  . ABDOMINAL HYSTERECTOMY  1969  . ARTERY BIOPSY N/A 11/07/2014   Procedure: BIOPSY TEMPORAL ARTERY;  Surgeon: Leta Baptist, MD;  Location: [facility];  Service: ENT;  Laterality: N/A;  . CARPAL TUNNEL RELEASE Right   . CHOLECYSTECTOMY  1976  . LUMBAR LAMINECTOMY/DECOMPRESSION MICRODISCECTOMY N/A 07/31/2015   Procedure: L4-5 Decompression, Possible Right L4-5 Microdiscectomy;  Surgeon: Marybelle Killings, MD;  Location: [facility];  Service: Orthopedics;  Laterality: N/A;  . NM MYOVIEW LTD  02/13/2010   No ischemia  . PLANTAR FASCIA RELEASE    . TONSILLECTOMY  1965  . US ECHOCARDIOGRAPHY  09/20/2008   borderline LVH,mild TR,AOV mildly sclerotic w/ca+ of the leaflets   Social History   Occupational History  . Not on file  Tobacco Use  . Smoking status: Former Smoker    Quit date: 02/04/2006    Years since quitting: 13.6  . Smokeless tobacco: Never Used  Substance and Sexual Activity  . Alcohol use: No    Alcohol/week: 0.0 standard drinks  . Drug use: No  . Sexual activity: Not on file

## 2019-10-28 ENCOUNTER — Other Ambulatory Visit: Payer: Self-pay

## 2019-10-28 ENCOUNTER — Telehealth: Payer: Self-pay | Admitting: Allergy and Immunology

## 2019-10-28 MED ORDER — PREDNISONE 10 MG PO TABS: 10.0000 mg | ORAL_TABLET | Freq: Two times a day (BID) | ORAL | 0 refills | Status: AC

## 2019-10-28 NOTE — Telephone Encounter (Signed)
This is a Dr. Neldon Mc patient.  I will route to him since he is in the office.  Salvatore Marvel, MD [facility]

## 2019-10-28 NOTE — Telephone Encounter (Signed)
Patient states the weather is messing with her sinuses. Patient is having tightness in her chest that started around 1am this morning. Patient was coughing, head stopped up, nose running with green mucus. Patient states she thinks she has an infection between her chest and her head. Patient would like prednisone called into [facility] on [address] to loosen up the things in her chest.  Please advise.

## 2019-10-28 NOTE — Telephone Encounter (Signed)
Please have Pat use Prednisne 10 mg - 1 tablet 2 times per day for 5 days plus activate her action plan noted below:  8. Can restart Dulera 200 2 inhalations + Qvar 80 2 inhalations two times per day during "asthma flare - up"

## 2019-10-28 NOTE — Telephone Encounter (Signed)
Dr. Ernst Bowler can you please advise on some recommendations for the patient.

## 2019-10-28 NOTE — Telephone Encounter (Signed)
Called patient to discuss her plan and inform her to pick up prescription later today she agreed and acknowledged understanding.

## 2019-11-03 ENCOUNTER — Ambulatory Visit (INDEPENDENT_AMBULATORY_CARE_PROVIDER_SITE_OTHER): Payer: Medicare HMO

## 2019-11-03 DIAGNOSIS — J4550 Severe persistent asthma, uncomplicated: Secondary | ICD-10-CM | POA: Diagnosis not present

## 2019-11-08 ENCOUNTER — Ambulatory Visit: Payer: Self-pay

## 2019-11-08 ENCOUNTER — Ambulatory Visit (INDEPENDENT_AMBULATORY_CARE_PROVIDER_SITE_OTHER): Payer: Medicare HMO

## 2019-11-08 DIAGNOSIS — J309 Allergic rhinitis, unspecified: Secondary | ICD-10-CM | POA: Diagnosis not present

## 2019-11-10 ENCOUNTER — Ambulatory Visit (INDEPENDENT_AMBULATORY_CARE_PROVIDER_SITE_OTHER): Payer: Medicare HMO | Admitting: *Deleted

## 2019-11-10 ENCOUNTER — Other Ambulatory Visit: Payer: Self-pay

## 2019-11-10 DIAGNOSIS — T63441D Toxic effect of venom of bees, accidental (unintentional), subsequent encounter: Secondary | ICD-10-CM | POA: Diagnosis not present

## 2019-11-24 ENCOUNTER — Encounter: Payer: Self-pay | Admitting: Internal Medicine

## 2019-11-24 ENCOUNTER — Ambulatory Visit (INDEPENDENT_AMBULATORY_CARE_PROVIDER_SITE_OTHER): Payer: Medicare HMO | Admitting: Internal Medicine

## 2019-11-24 VITALS — BP 156/78 | HR 83 | Ht 66.0 in | Wt 190.8 lb

## 2019-11-24 DIAGNOSIS — I447 Left bundle-branch block, unspecified: Secondary | ICD-10-CM

## 2019-11-24 DIAGNOSIS — E785 Hyperlipidemia, unspecified: Secondary | ICD-10-CM

## 2019-11-24 DIAGNOSIS — I10 Essential (primary) hypertension: Secondary | ICD-10-CM | POA: Diagnosis not present

## 2019-11-24 NOTE — Patient Instructions (Signed)
Medication Instructions:  Your physician recommends that you continue on your current medications as directed. Please refer to the Current Medication list given to you today.  *If you need a refill on your cardiac medications before your next appointment, please call your pharmacy*   Follow-Up: At [facility], you and your health needs are our priority.  As part of our continuing mission to provide you with exceptional heart care, we have created designated Provider Care Teams.  These Care Teams include your primary Cardiologist (physician) and Advanced Practice Providers (APPs -  Physician Assistants and Nurse Practitioners) who all work together to provide you with the care you need, when you need it.  We recommend signing up for the patient portal called "MyChart".  Sign up information is provided on this After Visit Summary.  MyChart is used to connect with patients for Virtual Visits (Telemedicine).  Patients are able to view lab/test results, encounter notes, upcoming appointments, etc.  Non-urgent messages can be sent to your provider as well.   To learn more about what you can do with MyChart, go to [email or web address].    Your next appointment:   6 month(s)  The format for your next appointment:   In Person  Provider:   You may see Dr. Debara Pickett or one of the following Advanced Practice Providers on your designated Care Team:    Almyra Deforest, PA-C  Fabian Sharp, PA-C or   Roby Lofts, [place]    Other Instructions NONE

## 2019-11-24 NOTE — Progress Notes (Signed)
OFFICE NOTE  Chief Complaint:  Ankle swelling  Primary Care Physician: Hayden Rasmussen, MD  HPI:  Andrea Lowery is a 79 year old female, history of some lower extremity edema, asthma, seasonal allergies, and a left bundle branch block. There was no evidence for ischemia based on stress testing in 2012. The main issue today is she was told recently that she was having worsening problems with snoring and witnessed episodes of apnea at night. She says her sleep is poor. She often feels fatigued throughout the day. Occasionally wakes up with headaches in the morning. Apparently, she underwent a sleep study in 2008 or so and that was negative; however, her weight was about 150 to 160 at the time. Since she has stopped smoking and gained about 40 pounds. I was concerned about sleep apnea and sent her for a sleep study which demonstrated significant sleep apnea. She was fitted with a mask and is doing well.  I have also readjusted her medications, switching her to Diovan HCTZ and decreasing her amlodipine, which has since been discontinued totally. She also recently underwent extensive dental work by Dr. Buelah Manis.  This included moderate sedation and was without complication.  She returns today feeling fairly well. EKG continues to show persistent left bundle branch block.  Andrea Lowery returns today for followup appointment. She was just seen by Cecilie Kicks, nurse practitioner, in our office for hospital followup. She presented to the hospital with shortness of breath and was told she was in congestive heart failure. A chest x-ray showed borderline cardiomegaly with early interstitial edema. A BNP was only mildly elevated at 170. She was told she had significant heart failure and there was concern about her persistent left bundle branch block which is well-documented and is nonischemic. She was discharged and in followup was given additional Lasix and lost 2 pounds. She reports her  breathing is back to normal although she still has persistent leg swelling. She is not good at reducing salt in her diet in fact eats a good amount of it. In addition, she is not on beta blocker.  A repeat echocardiogram was performed today, and demonstrated preserved systolic function with an EF of 55-60%, no significant valvular disease, and diastolic dysfunction.  I saw Andrea Lowery back today in the office. Her main complaint still is about ankle swelling. She has nonpitting edema mostly over the lateral malleolus bilaterally. Seems to be slightly worse in the right than the left. She denies any worsening shortness of breath or chest pain. She tells me that her primary care provider decrease her antihypertensive medications for unknown reasons, specifically valsartan was cut in half. She continues to have mild hypokalemia on low-dose repletion. She also was switched to pravastatin but is interested in going back to Crestor.  Andrea Lowery returns today for hospital follow-up. He was recently hospitalized and had mild diastolic heart failure exacerbation. In addition she was having headaches and thought to have possible temporal arteritis, however a temporal biopsy was negative. Subsequently she has been given additional medication for headaches. From a cardiology standpoint, she appears to be quite euvolemic on her current dose of diuretics, which had increased her to most recently. She reports some swelling around the right and left lateral malleoli, which does not appear to be edema.  01/19/2016  Andrea Lowery returns today for follow-up. Since I last saw her she reports no worsening shortness of breath or chest pain. She does have bilateral carotid artery disease which would require follow-up. Blood  pressure is noted to be elevated today 167/94. For some reason her valsartan and HCTZ was decreased from the 320/12.5 mg dose to the 160/12.5 mg dose. She is also on Lasix. She denies any significant  lower extremity edema.  02/26/2016  Andrea Lowery was seen today in follow-up. Her blood pressure is now much better controlled 120/80. This is likely due to better compliance with medications. She does report however that compliance with Crestor has been minimal, only taking 3-4 doses per week. Recent lab work indicated total cholesterol of 190, temperature is 144, HDL-C 46 and LDL-C 115. We discussed that her goal LDL should be much lower less than 70. Even if she were to take that medicine on a daily basis is not likely she will reach her goal. I felt like she may benefit from the addition of ezetimibe to her current regimen.  04/26/2016  Andrea Lowery was seen in follow-up today. After adding ezetimibe to her current therapy Crestor 40 mg, her LDL C now is significantly reduced to 54, down from 115. She had repeat carotid Dopplers which showed moderate stable bilateral carotid artery disease. Will repeat the studies in one year.  11/23/2018  Andrea Lowery returns today for follow-up.  Her main complaint is bilateral ankle swelling, namely focal enlargement of the tissue over the left and right lateral malleolus.  She is seen orthopedics, primary care and podiatry for this.  All feel that this is likely edema however she has very little history of peripheral edema in the past and I had previously noted her focal lateral ankle enlargement and felt it was more consistent with lipedema. Echo in the past had shown diastolic dysfunction and she has a known LBBB, certainly a risk factor for developing systolic CHF. BNP has consistently been low in the past and she is not on a diuretic. She also reports running out of her heart medications this year. She seems to be taking her diabetic meds, as her A1c was 7.0, however her LDL has increased from 82 to 186.  11/24/2019  Andrea Lowery is seen today in follow-up. Sadly she lost her grandson to a tragic car accident outside of her house. She has struggled with  heartache since then. She seems to have some persistent dyspnea - she gets allergy shots and has asthma.  I did an echo last year which showed normal systolic function.  She also has ongoing complaints about lower extremity puffiness without any real edema.  Again I have previously thought this is lipedema for which there if you have any treatments.  PMHx:  Past Medical History:  Diagnosis Date  . Arthritis   . Asthma   . Diabetes mellitus without complication ([place])    Diabetes II  . DVT (deep venous thrombosis) ([place])    "Years ago"  . Dyslipidemia   . GERD (gastroesophageal reflux disease)   . Headache    Migraine  . Hypertension   . Hypothyroidism   . LBBB (left bundle branch block)   . MGUS (monoclonal gammopathy of unknown significance)   . MGUS (monoclonal gammopathy of unknown significance)   . Migraine   . OSA on CPAP     Past Surgical History:  Procedure Laterality Date  . ABDOMINAL HYSTERECTOMY  1969  . ARTERY BIOPSY N/A 11/07/2014   Procedure: BIOPSY TEMPORAL ARTERY;  Surgeon: Leta Baptist, MD;  Location: [facility];  Service: ENT;  Laterality: N/A;  . CARPAL TUNNEL RELEASE Right   . CHOLECYSTECTOMY  1976  .  LUMBAR LAMINECTOMY/DECOMPRESSION MICRODISCECTOMY N/A 07/31/2015   Procedure: L4-5 Decompression, Possible Right L4-5 Microdiscectomy;  Surgeon: Marybelle Killings, MD;  Location: [facility];  Service: Orthopedics;  Laterality: N/A;  . NM MYOVIEW LTD  02/13/2010   No ischemia  . PLANTAR FASCIA RELEASE    . TONSILLECTOMY  1965  . US ECHOCARDIOGRAPHY  09/20/2008   borderline LVH,mild TR,AOV mildly sclerotic w/ca+ of the leaflets    FAMHx:  Family History  Problem Relation Age of Onset  . Diabetes Father   . Heart failure Father   . Hypertension Father   . Thyroid disease Sister   . Hypertension Sister     SOCHx:   reports that she quit smoking about 13 years ago. She has never used smokeless tobacco. She reports that she does not drink alcohol and does not use  drugs.  ALLERGIES:  Allergies  Allergen Reactions  . Bee Venom Anaphylaxis    Yellow jackets Yellow jackets  . Penicillins Hives    Has patient had a PCN reaction causing immediate rash, facial/tongue/throat swelling, SOB or lightheadedness with hypotension: Yes Has patient had a PCN reaction causing severe rash involving mucus membranes or skin necrosis: No Has patient had a PCN reaction that required hospitalization No Has patient had a PCN reaction occurring within the last 10 years: No If all of the above answers are "NO", then may proceed with Cephalosporin use.    ROS: Pertinent items noted in HPI and remainder of comprehensive ROS otherwise negative.  HOME MEDS: Current Outpatient Medications  Medication Sig Dispense Refill  . albuterol (VENTOLIN HFA) 108 (90 Base) MCG/ACT inhaler INHALE 2 PUFFS INTO THE LUNGS EVERY 4 HOURS AS NEEDED FOR WHEEZING OR SHORTNESS OF BREATH 18 g 1  . alendronate (FOSAMAX) 70 MG tablet Take 70 mg by mouth every Wednesday.     . budesonide-formoterol (SYMBICORT) 160-4.5 MCG/ACT inhaler INHALE 2 PUFFS BY MOUTH INTO THE LUNGS TWICE DAILY. RINSE, GARGLE AND SPIT AFTER USE 10.2 g 0  . cetirizine (ZYRTEC) 10 MG tablet Take 1 tablet 1-2 times a day for allergies. 180 tablet 1  . cycloSPORINE (RESTASIS) 0.05 % ophthalmic emulsion Place one drop into both eyes 2 (two) times daily.    Marland Kitchen EPINEPHrine 0.3 mg/0.3 mL IJ SOAJ injection Use as directed for severe allergic reaction 2 each 1  . esomeprazole (NEXIUM) 40 MG capsule Take 1 capsule (40 mg total) by mouth 2 (two) times daily. 180 capsule 1  . FASENRA 30 MG/ML SOSY INJECT 30MG  SUBCUTANEOUSLY EVERY 8 WEEKS 1 mL 6  . fluticasone (FLONASE) 50 MCG/ACT nasal spray SHAKE LIQUID AND USE 2 SPRAYS IN EACH NOSTRIL DAILY 48 g 5  . ipratropium-albuterol (DUONEB) 0.5-2.5 (3) MG/3ML SOLN USE 1 VIAL IN NEBULIZER EVERY 4 TO 6 HOURS AS NEEDED FOR COUGHING WHEEZING SHORTNESS OF BREATH 180 mL 0  . levothyroxine (SYNTHROID,  LEVOTHROID) 50 MCG tablet take 1 tablet by mouth once daily (Patient taking differently: take 1 tablet (50 mcg) by mouth once daily) 90 tablet 0  . metoprolol succinate (TOPROL-XL) 25 MG 24 hr tablet Take 1 tablet (25 mg total) by mouth daily. 90 tablet 3  . montelukast (SINGULAIR) 10 MG tablet TAKE 1 TABLET(10 MG) BY MOUTH AT BEDTIME 90 tablet 1  . olmesartan-hydrochlorothiazide (BENICAR HCT) 40-12.5 MG tablet TK 1 T PO D    . Potassium Chloride ER 20 MEQ TBCR     . potassium chloride SA (K-DUR) 20 MEQ tablet take 1 tablet by mouth once daily    .  rosuvastatin (CRESTOR) 40 MG tablet Take 1 tablet (40 mg total) by mouth daily. 90 tablet 3  . sertraline (ZOLOFT) 25 MG tablet     . topiramate (TOPAMAX) 50 MG tablet TAKE 1/2 TABLET BY MOUTH DAILY THEN IN 1 WEEK INCREASE TO 1 DAILY     Current Facility-Administered Medications  Medication Dose Route Frequency Provider Last Rate Last Admin  . Benralizumab SOSY 30 mg  30 mg Subcutaneous Q8 Weeks Kozlow, Donnamarie Poag, MD   30 mg at 09/30/19 1203    LABS/IMAGING: No results found for this or any previous visit (from the past 48 hour(s)). No results found.  VITALS: BP (!) 156/78   Pulse 83   Ht 5\' 6"  (1.676 m)   Wt 190 lb 12.8 oz (86.5 kg)   SpO2 98%   BMI 30.80 kg/m   EXAM: General appearance: alert and no distress Neck: no carotid bruit, no JVD and thyroid not enlarged, symmetric, no tenderness/mass/nodules Lungs: clear to auscultation bilaterally Heart: regular rate and rhythm, S1, S2 normal, no murmur, click, rub or gallop Abdomen: soft, non-tender; bowel sounds normal; no masses,  no organomegaly Extremities: bilateral lateral ankle enlargment Pulses: 2+ and symmetric Skin: Skin color, texture, turgor normal. No rashes or lesions Neurologic: Grossly normal Psych: Pleasant  EKG: Normal sinus rhythm, LBBB at 83-personally reviewed  ASSESSMENT: 1. Left bundle branch block 2. Dyslipidemia 3. Hypertension 4. Mild dyspnea on  exertion 5. Obstructive sleep apnea on CPAP 6. Diastolic dysfunction with recent mild acute congestive heart failure, EF 55-60% 7. 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   Mrs. Krawczyk is struggling emotionally after the loss of her grandson recently.  She was off of her medicines for a while but restarted it.  I am not certain she has any significant diastolic heart failure.  She continues to have lipedema in the ankles.  No evidence of any heart failure and her most recent echo showed normal systolic function.  She does have a left bundle branch block.  She reports no worsening dyspnea.  If she does develop any chest discomfort or worsening symptoms could consider CT coronary angiography to further evaluate for coronary disease.  Follow-up 6 months.  Pixie Casino, MD, [facility], [place] Director of the [facility] Lipid [facility] &  Cardiovascular [facility] Diplomate of the American Board of Clinical Lipidology Attending Cardiologist  Direct Dial: [phone]  Fax: [phone]  Website:  [email or web address]   Nadean Corwin Alexus Michael 11/24/2019, 1:40 PM

## 2019-11-25 ENCOUNTER — Other Ambulatory Visit: Payer: Self-pay

## 2019-11-25 ENCOUNTER — Ambulatory Visit (INDEPENDENT_AMBULATORY_CARE_PROVIDER_SITE_OTHER): Payer: Medicare HMO | Admitting: *Deleted

## 2019-11-25 DIAGNOSIS — J4550 Severe persistent asthma, uncomplicated: Secondary | ICD-10-CM | POA: Diagnosis not present

## 2019-11-29 ENCOUNTER — Other Ambulatory Visit: Payer: Self-pay | Admitting: Internal Medicine

## 2019-12-04 ENCOUNTER — Other Ambulatory Visit: Payer: Self-pay | Admitting: Internal Medicine

## 2020-01-05 ENCOUNTER — Telehealth: Payer: Self-pay | Admitting: *Deleted

## 2020-01-05 ENCOUNTER — Other Ambulatory Visit: Payer: Self-pay

## 2020-01-05 ENCOUNTER — Ambulatory Visit (INDEPENDENT_AMBULATORY_CARE_PROVIDER_SITE_OTHER): Payer: Medicare HMO | Admitting: *Deleted

## 2020-01-05 DIAGNOSIS — T63441D Toxic effect of venom of bees, accidental (unintentional), subsequent encounter: Secondary | ICD-10-CM

## 2020-01-05 NOTE — Telephone Encounter (Signed)
Patient last received her allergy injection on 11/08/19 and received 0.59mL out of her Red vials. She forgot that she needed to come in one more week and has not received an allergy injection since. She is normally every 4 weeks at 0.33mL. Please advise the best step for her next injection.

## 2020-01-05 NOTE — Telephone Encounter (Signed)
If she comes in this week, restart her red vial with 0.05 and build up weekly until reached maintenance red 0.5 and then go to every 4 weeks where she was.   Thank you.

## 2020-01-06 NOTE — Telephone Encounter (Signed)
Patient's flow sheet has been updated to reflect these changes.

## 2020-01-07 ENCOUNTER — Ambulatory Visit (INDEPENDENT_AMBULATORY_CARE_PROVIDER_SITE_OTHER): Payer: Medicare HMO | Admitting: *Deleted

## 2020-01-07 DIAGNOSIS — J309 Allergic rhinitis, unspecified: Secondary | ICD-10-CM | POA: Diagnosis not present

## 2020-01-10 DIAGNOSIS — J3089 Other allergic rhinitis: Secondary | ICD-10-CM

## 2020-01-10 NOTE — Progress Notes (Signed)
Vials exp 01-09-21

## 2020-01-11 ENCOUNTER — Ambulatory Visit (INDEPENDENT_AMBULATORY_CARE_PROVIDER_SITE_OTHER): Payer: Medicare HMO | Admitting: *Deleted

## 2020-01-11 DIAGNOSIS — J309 Allergic rhinitis, unspecified: Secondary | ICD-10-CM | POA: Diagnosis not present

## 2020-01-11 DIAGNOSIS — J302 Other seasonal allergic rhinitis: Secondary | ICD-10-CM

## 2020-01-18 ENCOUNTER — Ambulatory Visit (INDEPENDENT_AMBULATORY_CARE_PROVIDER_SITE_OTHER): Payer: Medicare HMO

## 2020-01-18 DIAGNOSIS — J309 Allergic rhinitis, unspecified: Secondary | ICD-10-CM

## 2020-01-20 ENCOUNTER — Ambulatory Visit (INDEPENDENT_AMBULATORY_CARE_PROVIDER_SITE_OTHER): Payer: Medicare HMO

## 2020-01-20 ENCOUNTER — Other Ambulatory Visit: Payer: Self-pay

## 2020-01-20 DIAGNOSIS — J4550 Severe persistent asthma, uncomplicated: Secondary | ICD-10-CM

## 2020-01-24 ENCOUNTER — Ambulatory Visit (INDEPENDENT_AMBULATORY_CARE_PROVIDER_SITE_OTHER): Payer: Medicare HMO

## 2020-01-24 DIAGNOSIS — J309 Allergic rhinitis, unspecified: Secondary | ICD-10-CM | POA: Diagnosis not present

## 2020-01-31 ENCOUNTER — Ambulatory Visit: Payer: Medicare HMO | Admitting: Dermatology

## 2020-02-03 ENCOUNTER — Ambulatory Visit (INDEPENDENT_AMBULATORY_CARE_PROVIDER_SITE_OTHER): Payer: Medicare HMO | Admitting: *Deleted

## 2020-02-03 DIAGNOSIS — J309 Allergic rhinitis, unspecified: Secondary | ICD-10-CM | POA: Diagnosis not present

## 2020-02-08 ENCOUNTER — Other Ambulatory Visit: Payer: Self-pay

## 2020-02-08 ENCOUNTER — Encounter: Payer: Self-pay | Admitting: Allergy and Immunology

## 2020-02-08 ENCOUNTER — Ambulatory Visit: Payer: Self-pay

## 2020-02-08 ENCOUNTER — Ambulatory Visit (INDEPENDENT_AMBULATORY_CARE_PROVIDER_SITE_OTHER): Payer: Medicare HMO | Admitting: Allergy and Immunology

## 2020-02-08 VITALS — BP 158/86 | HR 77 | Temp 97.3°F | Resp 18

## 2020-02-08 DIAGNOSIS — K219 Gastro-esophageal reflux disease without esophagitis: Secondary | ICD-10-CM | POA: Diagnosis not present

## 2020-02-08 DIAGNOSIS — J3089 Other allergic rhinitis: Secondary | ICD-10-CM | POA: Diagnosis not present

## 2020-02-08 DIAGNOSIS — J309 Allergic rhinitis, unspecified: Secondary | ICD-10-CM

## 2020-02-08 DIAGNOSIS — T63441D Toxic effect of venom of bees, accidental (unintentional), subsequent encounter: Secondary | ICD-10-CM

## 2020-02-08 DIAGNOSIS — J302 Other seasonal allergic rhinitis: Secondary | ICD-10-CM

## 2020-02-08 DIAGNOSIS — J4550 Severe persistent asthma, uncomplicated: Secondary | ICD-10-CM

## 2020-02-08 MED ORDER — CETIRIZINE HCL 10 MG PO TABS: ORAL_TABLET | ORAL | 1 refills | Status: DC

## 2020-02-08 MED ORDER — BUDESONIDE-FORMOTEROL FUMARATE 160-4.5 MCG/ACT IN AERO: INHALATION_SPRAY | RESPIRATORY_TRACT | 5 refills | Status: DC

## 2020-02-08 MED ORDER — ALBUTEROL SULFATE HFA 108 (90 BASE) MCG/ACT IN AERS: INHALATION_SPRAY | RESPIRATORY_TRACT | 1 refills | Status: DC

## 2020-02-08 NOTE — Progress Notes (Signed)
Andrea Lowery   Follow-up Note  Referring Provider: Hayden Rasmussen, MD Primary Provider: Hayden Rasmussen, MD Date of Office Visit: 02/08/2020  Subjective:   Andrea Lowery (DOB: [birth year]) is a 80 y.o. female who returns to the [facility] on 02/08/2020 in re-evaluation of the following:  HPI: Andrea Lowery presents to this clinic in evaluation of asthma and allergic rhinitis, LPR, and hymenoptera venom allergy.  She was last seen in this clinic on 18 August 2019.  I last saw her in this clinic on 10 November 2018.  Overall she is doing very well.  She does not use any inhaled controller agent for her asthma and relies on the use of benralizumab injections to control this issue which is under excellent control.  Rarely does she use a short acting bronchodilator averaging out to 1-2 times per week and she can exert herself without any problem.  It does not sound as though she has required a systemic steroid or an antibiotic for any type of airway issue since her last visit.  Her nose is doing quite well while utilizing a nasal steroid on a pretty consistent basis along with montelukast and continuing on immunotherapy.  Currently her immunotherapy is every 4 weeks.  She continues to receive immunotherapy as well directed against hymenoptera venom and has not had a systemic reaction to venom exposure exposure in years.  What is bothering her is lots of postnasal drip and some throat clearing.  She does have a history of LPR and she is using her Nexium only 1 time per day.  She has been drinking 4 Pepsi's per day.  She has received 3 Pfizer Covid vaccines and a flu vaccine.  Her 66 year old grandson who was living with her passed away in 04/30/19 from a automobile accident.  Allergies as of 02/08/2020      Reactions   Bee Venom Anaphylaxis   Yellow jackets Yellow jackets   Penicillins Hives   Has patient had a PCN reaction causing  immediate rash, facial/tongue/throat swelling, SOB or lightheadedness with hypotension: Yes Has patient had a PCN reaction causing severe rash involving mucus membranes or skin necrosis: No Has patient had a PCN reaction that required hospitalization No Has patient had a PCN reaction occurring within the last 10 years: No If all of the above answers are "NO", then may proceed with Cephalosporin use.      Medication List    albuterol 108 (90 Base) MCG/ACT inhaler Commonly known as: VENTOLIN HFA INHALE 2 PUFFS INTO THE LUNGS EVERY 4 HOURS AS NEEDED FOR WHEEZING OR SHORTNESS OF BREATH   alendronate 70 MG tablet Commonly known as: FOSAMAX Take 70 mg by mouth every Wednesday.   cetirizine 10 MG tablet Commonly known as: ZYRTEC Take 1 tablet 1-2 times a day for allergies.   cycloSPORINE 0.05 % ophthalmic emulsion Commonly known as: RESTASIS Place one drop into both eyes 2 (two) times daily.   EPINEPHrine 0.3 mg/0.3 mL Soaj injection Commonly known as: EPI-PEN Use as directed for severe allergic reaction   esomeprazole 40 MG capsule Commonly known as: NEXIUM Take 1 capsule (40 mg total) by mouth 2 (two) times daily.   Fasenra 30 MG/ML Sosy Generic drug: Benralizumab INJECT 30MG  SUBCUTANEOUSLY EVERY 8 WEEKS   fluticasone 50 MCG/ACT nasal spray Commonly known as: FLONASE SHAKE LIQUID AND USE 2 SPRAYS IN EACH NOSTRIL DAILY   ipratropium-albuterol 0.5-2.5 (3) MG/3ML Soln  Commonly known as: DUONEB USE 1 VIAL IN NEBULIZER EVERY 4 TO 6 HOURS AS NEEDED FOR COUGHING WHEEZING SHORTNESS OF BREATH   levothyroxine 50 MCG tablet Commonly known as: SYNTHROID take 1 tablet by mouth once daily   metoprolol succinate 25 MG 24 hr tablet Commonly known as: TOPROL-XL TAKE 1 TABLET(25 MG) BY MOUTH DAILY   montelukast 10 MG tablet Commonly known as: SINGULAIR TAKE 1 TABLET(10 MG) BY MOUTH AT BEDTIME   olmesartan-hydrochlorothiazide 40-12.5 MG tablet Commonly known as: BENICAR HCT TK 1 T  PO D   Potassium Chloride ER 20 MEQ Tbcr   potassium chloride SA 20 MEQ tablet Commonly known as: KLOR-CON take 1 tablet by mouth once daily   rosuvastatin 40 MG tablet Commonly known as: CRESTOR TAKE 1 TABLET(40 MG) BY MOUTH DAILY   topiramate 50 MG tablet Commonly known as: TOPAMAX TAKE 1/2 TABLET BY MOUTH DAILY THEN IN 1 WEEK INCREASE TO 1 DAILY       Past Medical History:  Diagnosis Date  . Arthritis   . Asthma   . Diabetes mellitus without complication ([place])    Diabetes II  . DVT (deep venous thrombosis) ([place])    "Years ago"  . Dyslipidemia   . Eczema   . GERD (gastroesophageal reflux disease)   . Headache    Migraine  . Hypertension   . Hypothyroidism   . LBBB (left bundle branch block)   . MGUS (monoclonal gammopathy of unknown significance)   . MGUS (monoclonal gammopathy of unknown significance)   . Migraine   . OSA on CPAP     Past Surgical History:  Procedure Laterality Date  . ABDOMINAL HYSTERECTOMY  1969  . ARTERY BIOPSY N/A 11/07/2014   Procedure: BIOPSY TEMPORAL ARTERY;  Surgeon: Leta Baptist, MD;  Location: [facility];  Service: ENT;  Laterality: N/A;  . CARPAL TUNNEL RELEASE Right   . CHOLECYSTECTOMY  1976  . LUMBAR LAMINECTOMY/DECOMPRESSION MICRODISCECTOMY N/A 07/31/2015   Procedure: L4-5 Decompression, Possible Right L4-5 Microdiscectomy;  Surgeon: Marybelle Killings, MD;  Location: [facility];  Service: Orthopedics;  Laterality: N/A;  . NM MYOVIEW LTD  02/13/2010   No ischemia  . PLANTAR FASCIA RELEASE    . TONSILLECTOMY  1965  . US ECHOCARDIOGRAPHY  09/20/2008   borderline LVH,mild TR,AOV mildly sclerotic w/ca+ of the leaflets    Review of systems negative except as noted in HPI / PMHx or noted below:  Review of Systems  Constitutional: Negative.   HENT: Negative.   Eyes: Negative.   Respiratory: Negative.   Cardiovascular: Negative.   Gastrointestinal: Negative.   Genitourinary: Negative.   Musculoskeletal: Negative.   Skin: Negative.   Neurological:  Negative.   Endo/Heme/Allergies: Negative.   Psychiatric/Behavioral: Negative.      Objective:   Vitals:   02/08/20 1200  BP: (!) 158/86  Pulse: 77  Resp: 18  Temp: (!) 97.3 F (36.3 C)  SpO2: 98%          Physical Exam Constitutional:      Appearance: She is not diaphoretic.  HENT:     Head: Normocephalic.     Right Ear: Tympanic membrane, ear canal and external ear normal.     Left Ear: Tympanic membrane, ear canal and external ear normal.     Nose: Nose normal. No mucosal edema or rhinorrhea.     Mouth/Throat:     Mouth: Oropharynx is clear and moist and mucous membranes are normal.     Pharynx: Uvula midline. No oropharyngeal  exudate.  Eyes:     Conjunctiva/sclera: Conjunctivae normal.  Neck:     Thyroid: No thyromegaly.     Trachea: Trachea normal. No tracheal tenderness or tracheal deviation.  Cardiovascular:     Rate and Rhythm: Normal rate and regular rhythm.     Heart sounds: Normal heart sounds, S1 normal and S2 normal. No murmur heard.   Pulmonary:     Effort: No respiratory distress.     Breath sounds: Normal breath sounds. No stridor. No wheezing or rales.  Musculoskeletal:        General: No edema.  Lymphadenopathy:     Head:     Right side of head: No tonsillar adenopathy.     Left side of head: No tonsillar adenopathy.     Cervical: No cervical adenopathy.  Skin:    Findings: No erythema or rash.     Nails: There is no clubbing.  Neurological:     Mental Status: She is alert.     Diagnostics:    Spirometry was performed and demonstrated an FEV1 of 1.39 at 80 % of predicted.  The patient had an Asthma Control Test with the following results: ACT Total Score: 14.    Assessment and Plan:   1. Asthma, severe persistent, well-controlled   2. Seasonal and perennial allergic rhinitis   3. LPRD (laryngopharyngeal reflux disease)   4. Toxic effect of venom of bees, unintentional, subsequent encounter     1. Continue Fluticasone 1-2  sprays each nostril two times per day  2. Continue montelukast 10mg  one tablet one time per day  3. INCREASE esomeprazole 40mg  one tablet twice a day and consider consolidating caffeine consumption  4. Continue immunotherapy for venoms    5. Continue benralizumab injections every 8 weeks  6. Restart aeroallergen immunotherapy  7. If Needed:     A. nasal saline multiple times per day   B. OTC mucinex DM twice a day   C. Cetirizine 10mg  one time per day  D. Proair HFA 2 inhalations or DUONEB every 4-6 hours  E.  EpiPen  8. Return to clinic in 6 months or earlier if problem  appears to be doing okay other than the fact that she still has some postnasal drip and throat clearing on a chronic basis.  We will increase her Nexium to twice a day and hopefully have her consolidate her caffeine consumption assuming that this is partially secondary to LPR.  Her atopic disease is under very good control with the use of benralizumab and immunotherapy directed against both aeroallergens and venom and some anti-inflammatory agents for her airway including use of a nasal steroid and a leukotriene modifier.  We are  not going to change much of this therapy at this point in time.  We will see her back in this clinic in 6 months or earlier if there is a problem.  , MD Allergy / Immunology [facility]

## 2020-02-08 NOTE — Patient Instructions (Addendum)
  1. Continue Fluticasone 1-2 sprays each nostril two times per day  2. Continue montelukast 10mg  one tablet one time per day  3. INCREASE esomeprazole 40mg  one tablet twice a day and consider consolidating caffeine consumption  4. Continue immunotherapy for venoms    5. Continue benralizumab injections every 8 weeks  6. Restart aeroallergen immunotherapy  7. If Needed:     A. nasal saline multiple times per day   B. OTC mucinex DM twice a day   C. Cetirizine 10mg  one time per day  D. Proair HFA 2 inhalations or DUONEB every 4-6 hours  E.  EpiPen  8. Return to clinic in 6 months or earlier if problem

## 2020-02-09 ENCOUNTER — Encounter: Payer: Self-pay | Admitting: Allergy and Immunology

## 2020-02-15 ENCOUNTER — Ambulatory Visit (INDEPENDENT_AMBULATORY_CARE_PROVIDER_SITE_OTHER): Payer: Medicare HMO

## 2020-02-15 DIAGNOSIS — J309 Allergic rhinitis, unspecified: Secondary | ICD-10-CM | POA: Diagnosis not present

## 2020-02-24 ENCOUNTER — Ambulatory Visit (INDEPENDENT_AMBULATORY_CARE_PROVIDER_SITE_OTHER): Payer: Medicare HMO

## 2020-02-24 DIAGNOSIS — J309 Allergic rhinitis, unspecified: Secondary | ICD-10-CM | POA: Diagnosis not present

## 2020-03-01 ENCOUNTER — Ambulatory Visit (INDEPENDENT_AMBULATORY_CARE_PROVIDER_SITE_OTHER): Payer: Medicare HMO

## 2020-03-01 DIAGNOSIS — T63441D Toxic effect of venom of bees, accidental (unintentional), subsequent encounter: Secondary | ICD-10-CM

## 2020-03-03 ENCOUNTER — Ambulatory Visit (INDEPENDENT_AMBULATORY_CARE_PROVIDER_SITE_OTHER): Payer: Medicare HMO

## 2020-03-03 DIAGNOSIS — J309 Allergic rhinitis, unspecified: Secondary | ICD-10-CM | POA: Diagnosis not present

## 2020-03-08 ENCOUNTER — Ambulatory Visit (INDEPENDENT_AMBULATORY_CARE_PROVIDER_SITE_OTHER): Payer: Medicare HMO

## 2020-03-08 DIAGNOSIS — J309 Allergic rhinitis, unspecified: Secondary | ICD-10-CM

## 2020-03-16 ENCOUNTER — Ambulatory Visit: Payer: Self-pay

## 2020-03-17 ENCOUNTER — Other Ambulatory Visit: Payer: Self-pay | Admitting: Allergy and Immunology

## 2020-03-24 ENCOUNTER — Ambulatory Visit (INDEPENDENT_AMBULATORY_CARE_PROVIDER_SITE_OTHER): Payer: Medicare HMO | Admitting: *Deleted

## 2020-03-24 ENCOUNTER — Other Ambulatory Visit: Payer: Self-pay

## 2020-03-24 DIAGNOSIS — J4550 Severe persistent asthma, uncomplicated: Secondary | ICD-10-CM

## 2020-03-28 ENCOUNTER — Ambulatory Visit (INDEPENDENT_AMBULATORY_CARE_PROVIDER_SITE_OTHER): Payer: Medicare HMO | Admitting: Podiatry

## 2020-03-28 ENCOUNTER — Encounter: Payer: Self-pay | Admitting: Podiatry

## 2020-03-28 ENCOUNTER — Other Ambulatory Visit: Payer: Self-pay

## 2020-03-28 DIAGNOSIS — I872 Venous insufficiency (chronic) (peripheral): Secondary | ICD-10-CM

## 2020-03-28 DIAGNOSIS — M25473 Effusion, unspecified ankle: Secondary | ICD-10-CM | POA: Diagnosis not present

## 2020-03-28 MED ORDER — METHYLPREDNISOLONE 4 MG PO TBPK
ORAL_TABLET | ORAL | 0 refills | Status: DC
Start: 2020-03-28 — End: 2020-07-17

## 2020-03-28 NOTE — Progress Notes (Signed)
Subjective:  Patient ID: Andrea Lowery, female    DOB: 09/30/1940,  MRN: [id number] HPI Chief Complaint  Patient presents with  . Ankle Pain    Patient states she still has swelling in both ankles (R>L), Dr. Jacqualyn Posey had given her an injection, but didn't help    80 y.o. female presents with the above complaint.   ROS: She denies fever chills nausea vomiting muscle aches pains calf pain back pain chest pain shortness of breath.  Past Medical History:  Diagnosis Date  . Arthritis   . Asthma   . Diabetes mellitus without complication ([place])    Diabetes II  . DVT (deep venous thrombosis) ([place])    "Years ago"  . Dyslipidemia   . Eczema   . GERD (gastroesophageal reflux disease)   . Headache    Migraine  . Hypertension   . Hypothyroidism   . LBBB (left bundle branch block)   . MGUS (monoclonal gammopathy of unknown significance)   . MGUS (monoclonal gammopathy of unknown significance)   . Migraine   . OSA on CPAP    Past Surgical History:  Procedure Laterality Date  . ABDOMINAL HYSTERECTOMY  1969  . ARTERY BIOPSY N/A 11/07/2014   Procedure: BIOPSY TEMPORAL ARTERY;  Surgeon: Leta Baptist, MD;  Location: [facility];  Service: ENT;  Laterality: N/A;  . CARPAL TUNNEL RELEASE Right   . CHOLECYSTECTOMY  1976  . LUMBAR LAMINECTOMY/DECOMPRESSION MICRODISCECTOMY N/A 07/31/2015   Procedure: L4-5 Decompression, Possible Right L4-5 Microdiscectomy;  Surgeon: Marybelle Killings, MD;  Location: [facility];  Service: Orthopedics;  Laterality: N/A;  . NM MYOVIEW LTD  02/13/2010   No ischemia  . PLANTAR FASCIA RELEASE    . TONSILLECTOMY  1965  . US ECHOCARDIOGRAPHY  09/20/2008   borderline LVH,mild TR,AOV mildly sclerotic w/ca+ of the leaflets    Current Outpatient Medications:  .  methylPREDNISolone (MEDROL DOSEPAK) 4 MG TBPK tablet, 6 day dose pack - take as directed, Disp: 21 tablet, Rfl: 0 .  ACCU-CHEK AVIVA PLUS test strip, , Disp: , Rfl:  .  Accu-Chek Softclix Lancets lancets, , Disp: , Rfl:  .   albuterol (VENTOLIN HFA) 108 (90 Base) MCG/ACT inhaler, INHALE 2 PUFFS INTO THE LUNGS EVERY 4 HOURS AS NEEDED FOR WHEEZING OR SHORTNESS OF BREATH, Disp: 18 g, Rfl: 1 .  Alcohol Swabs (B-D SINGLE USE SWABS REGULAR) PADS, , Disp: , Rfl:  .  alendronate (FOSAMAX) 70 MG tablet, Take 70 mg by mouth every Wednesday. , Disp: , Rfl:  .  budesonide-formoterol (SYMBICORT) 160-4.5 MCG/ACT inhaler, INHALE 2 PUFFS BY MOUTH INTO THE LUNGS TWICE DAILY. RINSE, GARGLE AND SPIT AFTER USE, Disp: 10.2 g, Rfl: 5 .  cetirizine (ZYRTEC) 10 MG tablet, Take 1 tablet 1-2 times a day for allergies., Disp: 180 tablet, Rfl: 1 .  chlorthalidone (HYGROTON) 25 MG tablet, , Disp: , Rfl:  .  cycloSPORINE (RESTASIS) 0.05 % ophthalmic emulsion, Place one drop into both eyes 2 (two) times daily., Disp: , Rfl:  .  divalproex (DEPAKOTE) 125 MG DR tablet, , Disp: , Rfl:  .  EPINEPHrine 0.3 mg/0.3 mL IJ SOAJ injection, Use as directed for severe allergic reaction, Disp: 2 each, Rfl: 1 .  esomeprazole (NEXIUM) 40 MG capsule, Take 1 capsule (40 mg total) by mouth 2 (two) times daily., Disp: 180 capsule, Rfl: 1 .  FASENRA 30 MG/ML SOSY, INJECT 30MG  SUBCUTANEOUSLY EVERY 8 WEEKS, Disp: 1 mL, Rfl: 6 .  fluticasone (FLONASE) 50 MCG/ACT nasal spray, SHAKE LIQUID  AND USE 2 SPRAYS IN EACH NOSTRIL DAILY, Disp: 48 g, Rfl: 5 .  gabapentin (NEURONTIN) 100 MG capsule, , Disp: , Rfl:  .  hydrOXYzine (ATARAX/VISTARIL) 10 MG tablet, , Disp: , Rfl:  .  ipratropium-albuterol (DUONEB) 0.5-2.5 (3) MG/3ML SOLN, USE 1 VIAL IN NEBULIZER EVERY 4 TO 6 HOURS AS NEEDED FOR COUGHING WHEEZING SHORTNESS OF BREATH, Disp: 180 mL, Rfl: 0 .  JARDIANCE 25 MG TABS tablet, , Disp: , Rfl:  .  ketoconazole (NIZORAL) 2 % cream, , Disp: , Rfl:  .  levothyroxine (SYNTHROID, LEVOTHROID) 50 MCG tablet, take 1 tablet by mouth once daily, Disp: 90 tablet, Rfl: 0 .  metoprolol succinate (TOPROL-XL) 25 MG 24 hr tablet, TAKE 1 TABLET(25 MG) BY MOUTH DAILY, Disp: 90 tablet, Rfl: 3 .   montelukast (SINGULAIR) 10 MG tablet, TAKE 1 TABLET(10 MG) BY MOUTH AT BEDTIME, Disp: 90 tablet, Rfl: 1 .  moxifloxacin (AVELOX) 400 MG tablet, , Disp: , Rfl:  .  olmesartan-hydrochlorothiazide (BENICAR HCT) 40-12.5 MG tablet, TK 1 T PO D, Disp: , Rfl:  .  Potassium Chloride ER 20 MEQ TBCR, , Disp: , Rfl:  .  potassium chloride SA (K-DUR) 20 MEQ tablet, take 1 tablet by mouth once daily, Disp: , Rfl:  .  QUEtiapine (SEROQUEL) 25 MG tablet, , Disp: , Rfl:  .  rosuvastatin (CRESTOR) 40 MG tablet, TAKE 1 TABLET(40 MG) BY MOUTH DAILY, Disp: 90 tablet, Rfl: 3 .  topiramate (TOPAMAX) 50 MG tablet, TAKE 1/2 TABLET BY MOUTH DAILY THEN IN 1 WEEK INCREASE TO 1 DAILY, Disp: , Rfl:   Current Facility-Administered Medications:  .  Benralizumab SOSY 30 mg, 30 mg, Subcutaneous, Q8 Weeks, Kozlow, Donnamarie Poag, MD, 30 mg at 03/24/20 1409  Allergies  Allergen Reactions  . Bee Venom Anaphylaxis    Yellow jackets Yellow jackets  . Penicillins Hives    Has patient had a PCN reaction causing immediate rash, facial/tongue/throat swelling, SOB or lightheadedness with hypotension: Yes Has patient had a PCN reaction causing severe rash involving mucus membranes or skin necrosis: No Has patient had a PCN reaction that required hospitalization No Has patient had a PCN reaction occurring within the last 10 years: No If all of the above answers are "NO", then may proceed with Cephalosporin use.    Review of Systems Objective:  There were no vitals filed for this visit.  General: Well developed, nourished, in no acute distress, alert and oriented x3   Dermatological: Skin is warm, dry and supple bilateral. Nails x 10 are well maintained; remaining integument appears unremarkable at this time. There are no open sores, no preulcerative lesions, no rash or signs of infection present.  Vascular: Dorsalis Pedis artery and Posterior Tibial artery pedal pulses are 2/4 bilateral with immedate capillary fill time. Pedal hair  growth present. No varicosities and no lower extremity edema present bilateral.   Neruologic: Grossly intact via light touch bilateral. Vibratory intact via tuning fork bilateral. Protective threshold with Semmes Wienstein monofilament intact to all pedal sites bilateral. Patellar and Achilles deep tendon reflexes 2+ bilateral. No Babinski or clonus noted bilateral.   Musculoskeletal: No gross boney pedal deformities bilateral. No pain, crepitus, or limitation noted with foot and ankle range of motion bilateral. Muscular strength 5/5 in all groups tested bilateral.  Swelling of the ankles and legs particularly overlying the fat pads over the anterolateral ankles and anteromedial ankles.  Gait: Unassisted, Nonantalgic.    Radiographs:  None taken  Assessment & Plan:  Assessment: Edema bilateral lower extremity  Plan: We are going to request venous Dopplers for venous insufficiency primarily.  Also start her on a Medrol Dosepak and I will follow-up with her once her report comes in.     Lewie Deman T. [place], [place]

## 2020-03-30 ENCOUNTER — Other Ambulatory Visit: Payer: Self-pay

## 2020-03-30 ENCOUNTER — Ambulatory Visit (HOSPITAL_COMMUNITY)
Admission: RE | Admit: 2020-03-30 | Discharge: 2020-03-30 | Disposition: A | Discharge status: Home / Self Care | Payer: Medicare HMO | Source: Ambulatory Visit | Attending: Podiatry | Admitting: Podiatry

## 2020-03-30 DIAGNOSIS — M25473 Effusion, unspecified ankle: Secondary | ICD-10-CM | POA: Diagnosis not present

## 2020-03-30 DIAGNOSIS — I872 Venous insufficiency (chronic) (peripheral): Secondary | ICD-10-CM | POA: Insufficient documentation

## 2020-04-03 ENCOUNTER — Telehealth: Payer: Self-pay | Admitting: *Deleted

## 2020-04-03 DIAGNOSIS — R0989 Other specified symptoms and signs involving the circulatory and respiratory systems: Secondary | ICD-10-CM

## 2020-04-03 DIAGNOSIS — I872 Venous insufficiency (chronic) (peripheral): Secondary | ICD-10-CM

## 2020-04-03 NOTE — Telephone Encounter (Signed)
Orders placed for vascular consult for treatment.

## 2020-04-03 NOTE — Telephone Encounter (Signed)
-----   Message from Garrel Ridgel, [place] sent at 03/30/2020  5:22 PM EST ----- See if they are going to see her for possible tx options please.

## 2020-04-13 ENCOUNTER — Ambulatory Visit (INDEPENDENT_AMBULATORY_CARE_PROVIDER_SITE_OTHER): Payer: Medicare HMO

## 2020-04-13 DIAGNOSIS — J309 Allergic rhinitis, unspecified: Secondary | ICD-10-CM

## 2020-04-17 ENCOUNTER — Other Ambulatory Visit: Payer: Self-pay

## 2020-04-17 ENCOUNTER — Ambulatory Visit (INDEPENDENT_AMBULATORY_CARE_PROVIDER_SITE_OTHER): Payer: Medicare HMO | Admitting: Physician Assistant

## 2020-04-17 ENCOUNTER — Encounter: Payer: Self-pay | Admitting: Physician Assistant

## 2020-04-17 VITALS — BP 151/83 | HR 73 | Temp 97.3°F | Resp 20 | Ht 66.0 in | Wt 193.2 lb

## 2020-04-17 DIAGNOSIS — K5900 Constipation, unspecified: Secondary | ICD-10-CM | POA: Insufficient documentation

## 2020-04-17 DIAGNOSIS — I872 Venous insufficiency (chronic) (peripheral): Secondary | ICD-10-CM | POA: Diagnosis not present

## 2020-04-17 DIAGNOSIS — K5730 Diverticulosis of large intestine without perforation or abscess without bleeding: Secondary | ICD-10-CM | POA: Insufficient documentation

## 2020-04-17 DIAGNOSIS — I8393 Asymptomatic varicose veins of bilateral lower extremities: Secondary | ICD-10-CM

## 2020-04-17 NOTE — Progress Notes (Signed)
[facility]   Reason for referral: Swelling in B ankles, left leg aching  History of Present Illness  Andrea Lowery is a 80 y.o. female who presents with chief complaint: swollen leg.  Patient notes, onset of swelling > 12 months ago, associated with prolonged sitting and standing.  The patient has had negative history of DVT, no history of varicose vein, no history of venous stasis ulcers, no history of  Lymphedema and no history of skin changes in lower legs.  There is positive family history of venous disorders.  The patient has not used compression stockings in the past.  Past Medical History:  Diagnosis Date  . Arthritis   . Asthma   . Diabetes mellitus without complication ([place])    Diabetes II  . DVT (deep venous thrombosis) ([place])    "Years ago"  . Dyslipidemia   . Eczema   . GERD (gastroesophageal reflux disease)   . Headache    Migraine  . Hypertension   . Hypothyroidism   . LBBB (left bundle branch block)   . MGUS (monoclonal gammopathy of unknown significance)   . MGUS (monoclonal gammopathy of unknown significance)   . Migraine   . OSA on CPAP     Past Surgical History:  Procedure Laterality Date  . ABDOMINAL HYSTERECTOMY  1969  . ARTERY BIOPSY N/A 11/07/2014   Procedure: BIOPSY TEMPORAL ARTERY;  Surgeon: Leta Baptist, MD;  Location: [facility];  Service: ENT;  Laterality: N/A;  . CARPAL TUNNEL RELEASE Right   . CHOLECYSTECTOMY  1976  . LUMBAR LAMINECTOMY/DECOMPRESSION MICRODISCECTOMY N/A 07/31/2015   Procedure: L4-5 Decompression, Possible Right L4-5 Microdiscectomy;  Surgeon: Marybelle Killings, MD;  Location: [facility];  Service: Orthopedics;  Laterality: N/A;  . NM MYOVIEW LTD  02/13/2010   No ischemia  . PLANTAR FASCIA RELEASE    . TONSILLECTOMY  1965  . US ECHOCARDIOGRAPHY  09/20/2008   borderline LVH,mild TR,AOV mildly sclerotic w/ca+ of the leaflets    Social History   Socioeconomic History  . Marital status: Divorced    Spouse name:  Not on file  . Number of children: Not on file  . Years of education: Not on file  . Highest education level: Not on file  Occupational History  . Not on file  Tobacco Use  . Smoking status: Former Smoker    Quit date: 02/04/2006    Years since quitting: 14.2  . Smokeless tobacco: Never Used  Vaping Use  . Vaping Use: Never used  Substance and Sexual Activity  . Alcohol use: No    Alcohol/week: 0.0 standard drinks  . Drug use: No  . Sexual activity: Not on file  Other Topics Concern  . Not on file  Social History Narrative   Lives with sister in a one story home.  Has one child.  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     Social Determinants of Health   Financial Resource Strain: Not on file  Food Insecurity: Not on file  Transportation Needs: Not on file  Physical Activity: Not on file  Stress: Not on file  Social Connections: Not on file  Intimate Partner Violence: Not on file    Family History  Problem Relation Age of Onset  . Diabetes Father   . Heart failure Father   . Hypertension Father   . Thyroid disease Sister   . Hypertension Sister     Current Outpatient Medications on File Prior to Visit  Medication Sig Dispense Refill  .  ACCU-CHEK AVIVA PLUS test strip     . Accu-Chek Softclix Lancets lancets     . albuterol (VENTOLIN HFA) 108 (90 Base) MCG/ACT inhaler INHALE 2 PUFFS INTO THE LUNGS EVERY 4 HOURS AS NEEDED FOR WHEEZING OR SHORTNESS OF BREATH 18 g 1  . Alcohol Swabs (B-D SINGLE USE SWABS REGULAR) PADS     . alendronate (FOSAMAX) 70 MG tablet Take 70 mg by mouth every Wednesday.     . budesonide-formoterol (SYMBICORT) 160-4.5 MCG/ACT inhaler INHALE 2 PUFFS BY MOUTH INTO THE LUNGS TWICE DAILY. RINSE, GARGLE AND SPIT AFTER USE 10.2 g 5  . cetirizine (ZYRTEC) 10 MG tablet Take 1 tablet 1-2 times a day for allergies. 180 tablet 1  . chlorthalidone (HYGROTON) 25 MG tablet     . EPINEPHrine 0.3 mg/0.3 mL IJ SOAJ injection Use as directed for severe allergic reaction 2 each 1  .  esomeprazole (NEXIUM) 40 MG capsule Take 1 capsule (40 mg total) by mouth 2 (two) times daily. 180 capsule 1  . FASENRA 30 MG/ML SOSY INJECT 30MG  SUBCUTANEOUSLY EVERY 8 WEEKS 1 mL 6  . fluticasone (FLONASE) 50 MCG/ACT nasal spray SHAKE LIQUID AND USE 2 SPRAYS IN EACH NOSTRIL DAILY 48 g 5  . gabapentin (NEURONTIN) 100 MG capsule     . ipratropium-albuterol (DUONEB) 0.5-2.5 (3) MG/3ML SOLN USE 1 VIAL IN NEBULIZER EVERY 4 TO 6 HOURS AS NEEDED FOR COUGHING WHEEZING SHORTNESS OF BREATH 180 mL 0  . JARDIANCE 25 MG TABS tablet     . ketoconazole (NIZORAL) 2 % cream     . levothyroxine (SYNTHROID, LEVOTHROID) 50 MCG tablet take 1 tablet by mouth once daily 90 tablet 0  . metoprolol succinate (TOPROL-XL) 25 MG 24 hr tablet TAKE 1 TABLET(25 MG) BY MOUTH DAILY 90 tablet 3  . montelukast (SINGULAIR) 10 MG tablet TAKE 1 TABLET(10 MG) BY MOUTH AT BEDTIME 90 tablet 1  . olmesartan-hydrochlorothiazide (BENICAR HCT) 40-12.5 MG tablet TK 1 T PO D    . Potassium Chloride ER 20 MEQ TBCR     . potassium chloride SA (K-DUR) 20 MEQ tablet take 1 tablet by mouth once daily    . rosuvastatin (CRESTOR) 40 MG tablet TAKE 1 TABLET(40 MG) BY MOUTH DAILY 90 tablet 3  . topiramate (TOPAMAX) 50 MG tablet TAKE 1/2 TABLET BY MOUTH DAILY THEN IN 1 WEEK INCREASE TO 1 DAILY    . cycloSPORINE (RESTASIS) 0.05 % ophthalmic emulsion Place one drop into both eyes 2 (two) times daily. (Patient not taking: Reported on 04/17/2020)    . divalproex (DEPAKOTE) 125 MG DR tablet  (Patient not taking: Reported on 04/17/2020)    . hydrOXYzine (ATARAX/VISTARIL) 10 MG tablet  (Patient not taking: Reported on 04/17/2020)    . methylPREDNISolone (MEDROL DOSEPAK) 4 MG TBPK tablet 6 day dose pack - take as directed (Patient not taking: Reported on 04/17/2020) 21 tablet 0  . moxifloxacin (AVELOX) 400 MG tablet  (Patient not taking: Reported on 04/17/2020)    . QUEtiapine (SEROQUEL) 25 MG tablet  (Patient not taking: Reported on 04/17/2020)     Current  Facility-Administered Medications on File Prior to Visit  Medication Dose Route Frequency Provider Last Rate Last Admin  . Benralizumab SOSY 30 mg  30 mg Subcutaneous Q8 Weeks Jiles Prows, MD   30 mg at 03/24/20 1409    Allergies as of 04/17/2020 - Review Complete 04/17/2020  Allergen Reaction Noted  . Bee venom Anaphylaxis 07/29/2013  . Penicillins Hives 01/01/2011  ROS:   General:  No weight loss, Fever, chills  HEENT: No recent headaches, no nasal bleeding, no visual changes, no sore throat  Neurologic: No dizziness, blackouts, seizures. No recent symptoms of stroke or mini- stroke. No recent episodes of slurred speech, or temporary blindness.  Cardiac: No recent episodes of chest pain/pressure, no shortness of breath at rest.  No shortness of breath with exertion.  Denies history of atrial fibrillation or irregular heartbeat  Vascular: No history of rest pain in feet.  No history of claudication.  No history of non-healing ulcer, No history of DVT   Pulmonary: No home oxygen, no productive cough, no hemoptysis,  No asthma or wheezing  Musculoskeletal:  [ ]  Arthritis, [x ] Low back pain,  [x ] Joint pain  Hematologic:No history of hypercoagulable state.  No history of easy bleeding.  No history of anemia  Gastrointestinal: No hematochezia or melena,  No gastroesophageal reflux, no trouble swallowing  Urinary: [ ]  chronic Kidney disease, [ ]  on HD - [ ]  MWF or [ ]  TTHS, [ ]  Burning with urination, [ ]  Frequent urination, [ ]  Difficulty urinating;   Skin: No rashes  Psychological: No history of anxiety,  No history of depression  Physical Examination  Vitals:   04/17/20 1105  BP: (!) 151/83  Pulse: 73  Resp: 20  Temp: (!) 97.3 F (36.3 C)  TempSrc: Temporal  SpO2: 98%  Weight: 193 lb 3.2 oz (87.6 kg)  Height: 5\' 6"  (1.676 m)    Body mass index is 31.18 kg/m.  General:  Alert and oriented, no acute distress HEENT: Normal Neck: No bruit or  JVD Pulmonary: Clear to auscultation bilaterally Cardiac: Regular Rate and Rhythm without murmur Abdomen: Soft, non-tender, non-distended, no mass, no scars Skin: No rash, B lateral malleolus Extremity Pulses:  2+ radial, brachial, femoral, dorsalis pedis pulses bilaterally Musculoskeletal: No deformity or edema  Neurologic: Upper and lower extremity motor 5/5 and symmetric  DATA: Venous Reflux Times  +--------------+---------+------+-----------+------------+--------+  RIGHT     Reflux NoRefluxReflux TimeDiameter cmsComments               Yes                   +--------------+---------+------+-----------+------------+--------+  CFV      no                         +--------------+---------+------+-----------+------------+--------+  FV mid    no                         +--------------+---------+------+-----------+------------+--------+  Popliteal   no                         +--------------+---------+------+-----------+------------+--------+  GSV at [facility]  no               0.68        +--------------+---------+------+-----------+------------+--------+  GSV prox thighno               0.41        +--------------+---------+------+-----------+------------+--------+  GSV mid thigh no               0.41        +--------------+---------+------+-----------+------------+--------+  GSV dist thighno               0.44        +--------------+---------+------+-----------+------------+--------+  GSV at knee  no  0.28        +--------------+---------+------+-----------+------------+--------+  GSV prox calf       yes  >500 ms   0.27         +--------------+---------+------+-----------+------------+--------+  SSV Pop Fossa no               0.23        +--------------+---------+------+-----------+------------+--------+  SSV prox calf no               0.37        +--------------+---------+------+-----------+------------+--------+  SSV mid calf no               0.28        +--------------+---------+------+-----------+------------+--------+     +--------------+---------+------+-----------+------------+--------+  LEFT     Reflux NoRefluxReflux TimeDiameter cmsComments               Yes                   +--------------+---------+------+-----------+------------+--------+  CFV      no                         +--------------+---------+------+-----------+------------+--------+  FV mid    no                         +--------------+---------+------+-----------+------------+--------+  Popliteal   no                         +--------------+---------+------+-----------+------------+--------+  GSV at SFJ        yes  >500 ms   0.73        +--------------+---------+------+-----------+------------+--------+  GSV prox thigh      yes  >500 ms   0.57        +--------------+---------+------+-----------+------------+--------+  GSV mid thigh       yes  >500 ms   0.55        +--------------+---------+------+-----------+------------+--------+  GSV dist thigh      yes  >500 ms   0.45        +--------------+---------+------+-----------+------------+--------+  GSV at knee        yes  >500 ms   0.50        +--------------+---------+------+-----------+------------+--------+  GSV prox calf       yes  >500 ms    0.39        +--------------+---------+------+-----------+------------+--------+  SSV Pop Fossa no               0.30        +--------------+---------+------+-----------+------------+--------+  SSV prox calf no               0.19        +--------------+---------+------+-----------+------------+--------+  SSV mid calf no               0.27        +--------------+---------+------+-----------+------------+--------+    Summary:  Bilateral:  - No evidence of deep vein thrombosis seen in the lower extremities,  bilaterally, from the common femoral through the popliteal veins.  - No evidence of superficial venous thrombosis in the lower extremities,  bilaterally.    Right:  - Venous reflux is noted in the right greater saphenous vein in the calf.    Left:  - Venous reflux is noted in the left sapheno-femoral junction.  - Venous reflux is noted in the left greater saphenous vein in the thigh.  - Venous reflux is noted in the left greater  saphenous vein in the calf.    Assessment: Left LE venous reflux without evidence of DVT B LE There is reflux at the left SFJ with enlarged vein size throughout the GSV.   Very minimal reflux on the right LE.   She has pedal pulses and is not at risk of limb loss.    Plan: We will place her in thigh high 20-30 mm hg to be worn daily, elevation and exercise daily.  She will f/u in our vein clinic in 3 months for exam and discussion of possible intervention with laser ablation.  Roxy Horseman PA-C [facility] Office: [phone]  MD in clinic [place]

## 2020-04-20 ENCOUNTER — Other Ambulatory Visit: Payer: Self-pay | Admitting: Allergy and Immunology

## 2020-04-26 ENCOUNTER — Ambulatory Visit (INDEPENDENT_AMBULATORY_CARE_PROVIDER_SITE_OTHER): Payer: Medicare HMO

## 2020-04-26 DIAGNOSIS — T63441D Toxic effect of venom of bees, accidental (unintentional), subsequent encounter: Secondary | ICD-10-CM

## 2020-05-18 ENCOUNTER — Other Ambulatory Visit: Payer: Self-pay

## 2020-05-18 ENCOUNTER — Ambulatory Visit (INDEPENDENT_AMBULATORY_CARE_PROVIDER_SITE_OTHER): Payer: Medicare HMO | Admitting: *Deleted

## 2020-05-18 DIAGNOSIS — J4550 Severe persistent asthma, uncomplicated: Secondary | ICD-10-CM | POA: Diagnosis not present

## 2020-05-22 ENCOUNTER — Ambulatory Visit (INDEPENDENT_AMBULATORY_CARE_PROVIDER_SITE_OTHER): Payer: Medicare HMO | Admitting: *Deleted

## 2020-05-22 DIAGNOSIS — J309 Allergic rhinitis, unspecified: Secondary | ICD-10-CM

## 2020-05-23 ENCOUNTER — Other Ambulatory Visit: Payer: Self-pay

## 2020-05-23 ENCOUNTER — Encounter: Payer: Self-pay | Admitting: Emergency Medicine

## 2020-05-23 ENCOUNTER — Ambulatory Visit
Admission: EM | Admit: 2020-05-23 | Discharge: 2020-05-23 | Disposition: A | Discharge status: Home / Self Care | Payer: Medicare HMO | Attending: Family Medicine | Admitting: Family Medicine

## 2020-05-23 DIAGNOSIS — R059 Cough, unspecified: Secondary | ICD-10-CM

## 2020-05-23 DIAGNOSIS — J069 Acute upper respiratory infection, unspecified: Secondary | ICD-10-CM

## 2020-05-23 DIAGNOSIS — R062 Wheezing: Secondary | ICD-10-CM

## 2020-05-23 MED ORDER — PROMETHAZINE-DM 6.25-15 MG/5ML PO SYRP: 5.0000 mL | ORAL_SOLUTION | Freq: Four times a day (QID) | ORAL | 0 refills | Status: DC | PRN

## 2020-05-23 MED ORDER — IPRATROPIUM-ALBUTEROL 0.5-2.5 (3) MG/3ML IN SOLN: 3.0000 mL | Freq: Four times a day (QID) | RESPIRATORY_TRACT | 0 refills | Status: DC | PRN

## 2020-05-23 NOTE — ED Provider Notes (Signed)
[facility]    CSN: [id number] Arrival date & time: 05/23/20  1314      History   Chief Complaint Chief Complaint  Patient presents with  . Cough  . Diarrhea  . Sore Throat    HPI Andrea Lowery is a 80 y.o. female.   HPI  Patient presents with URI symptoms including cough, sore throat, otalgia, nasal congestion, runny nose, and sinus pressure. Active medical problems include asthma and diabetes mellitus . No known sick exposures.  Past Medical History:  Diagnosis Date  . Arthritis   . Asthma   . Diabetes mellitus without complication ([place])    Diabetes II  . DVT (deep venous thrombosis) ([place])    "Years ago"  . Dyslipidemia   . Eczema   . GERD (gastroesophageal reflux disease)   . Headache    Migraine  . Hypertension   . Hypothyroidism   . LBBB (left bundle branch block)   . MGUS (monoclonal gammopathy of unknown significance)   . MGUS (monoclonal gammopathy of unknown significance)   . Migraine   . OSA on CPAP     Patient Active Problem List   Diagnosis Date Noted  . Constipation 04/17/2020  . 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 Protrusion of lumbar intervertebral disc 09/13/2019  . Seasonal and perennial allergic rhinitis 08/18/2019  . 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  . No-show for appointment 07/07/2019  . Elevated serum creatinine 06/05/2018  . Hyperglycemia 06/05/2018  . Arthralgia 08/02/2016  . Eczema 08/02/2016  . Hypokalemia 04/30/2016  . Impingement syndrome of left shoulder 04/05/2016  . Carotid artery disease ([place]) 01/19/2016  . Dyslipidemia 01/19/2016  . Acute severe exacerbation of asthma 01/10/2016  . 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 Noncompliance 11/20/2015  . Seborrheic dermatitis of scalp 11/20/2015  . Edema of extremities 11/20/2015  . History of lumbar laminectomy for spinal cord decompression 07/31/2015  . Candidal skin infection 06/14/2015  .  Osteoporosis without current pathological fracture 05/30/2015  . Chronic pain of right hip 05/02/2015  . Chronic right-sided low back pain with right-sided sciatica 03/31/2015  . Temporal arteritis ([place]) 11/07/2014  . Headache 11/07/2014  . Diabetes mellitus type 2, controlled ([place]) 11/07/2014  . Diastolic CHF ([place]) 78/93/8101  . Sacroiliac dysfunction 11/04/2014  . Trochanteric bursitis of right hip 11/04/2014  . Asthma 10/05/2014  . GERD (gastroesophageal reflux disease) 10/05/2014  . Allergic rhinitis 10/05/2014  . Hypothyroidism 09/21/2014  . Multinodular goiter 08/09/2014  . Disorder of sacroiliac joint 07/29/2014  . 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 Thyroid nodule 07/01/2014  . Hypersomnolence 04/16/2014  . Snoring 04/16/2014  . Anxiety 03/18/2014  . Preventive measure 01/25/2014  . Difficulty sleeping 12/28/2013  . Hyperlipidemia 12/27/2013  . Acute diastolic CHF (congestive heart failure), NYHA class 2 ([place]) 07/29/2013  . Bilateral leg edema 07/20/2013  . Edema 07/20/2013  . 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 Monoclonal paraproteinemia 05/20/2013  . Essential hypertension 07/01/2012  . LBBB (left bundle branch block) 07/01/2012  . DOE (dyspnea on exertion) 07/01/2012  . OSA on CPAP 07/01/2012  . Abnormal respiratory rate 07/01/2012    Past Surgical History:  Procedure Laterality Date  . ABDOMINAL HYSTERECTOMY  1969  . ARTERY BIOPSY N/A 11/07/2014   Procedure: BIOPSY TEMPORAL ARTERY;  Surgeon: Leta Baptist, MD;  Location: [place];  Service: ENT;  Laterality: N/A;  . CARPAL TUNNEL RELEASE Right   . CHOLECYSTECTOMY  1976  .  LUMBAR LAMINECTOMY/DECOMPRESSION MICRODISCECTOMY N/A 07/31/2015   Procedure: L4-5 Decompression, Possible Right L4-5 Microdiscectomy;  Surgeon: Marybelle Killings, MD;  Location: [place];  Service: Orthopedics;  Laterality: N/A;  . NM MYOVIEW LTD  02/13/2010   No ischemia  . PLANTAR  FASCIA RELEASE    . TONSILLECTOMY  1965  . US ECHOCARDIOGRAPHY  09/20/2008   borderline LVH,mild TR,AOV mildly sclerotic w/ca+ of the leaflets    OB History   No obstetric history on file.      Home Medications    Prior to Admission medications   Medication Sig Start Date End Date Taking? Authorizing Provider  ipratropium-albuterol (DUONEB) 0.5-2.5 (3) MG/3ML SOLN Take 3 mLs by nebulization every 6 (six) hours as needed. 05/23/20  Yes Scot Jun, FNP  promethazine-dextromethorphan (PROMETHAZINE-DM) 6.25-15 MG/5ML syrup Take 5 mLs by mouth 4 (four) times daily as needed for cough. 05/23/20  Yes Scot Jun, FNP  ACCU-CHEK AVIVA PLUS test strip  12/01/19   [provider]  Accu-Chek Softclix Lancets lancets  12/01/19   [provider]  albuterol (VENTOLIN HFA) 108 (90 Base) MCG/ACT inhaler INHALE 2 PUFFS INTO THE LUNGS EVERY 4 HOURS AS NEEDED FOR WHEEZING OR SHORTNESS OF BREATH 04/20/20   Kozlow, Donnamarie Poag, MD  Alcohol Swabs (B-D SINGLE USE SWABS REGULAR) PADS  01/04/20   [provider]  alendronate (FOSAMAX) 70 MG tablet Take 70 mg by mouth every Wednesday.  05/30/15   [provider]  budesonide-formoterol (SYMBICORT) 160-4.5 MCG/ACT inhaler INHALE 2 PUFFS BY MOUTH INTO THE LUNGS TWICE DAILY. RINSE, GARGLE AND SPIT AFTER USE 02/08/20   Kozlow, Donnamarie Poag, MD  cetirizine (ZYRTEC) 10 MG tablet Take 1 tablet 1-2 times a day for allergies. 02/08/20   Kozlow, Donnamarie Poag, MD  chlorthalidone (HYGROTON) 25 MG tablet  12/14/19   [provider]  cycloSPORINE (RESTASIS) 0.05 % ophthalmic emulsion Place one drop into both eyes 2 (two) times daily. Patient not taking: Reported on 04/17/2020 11/20/15   [provider]  divalproex (DEPAKOTE) 125 MG DR tablet  12/06/19   [provider]  EPINEPHrine 0.3 mg/0.3 mL IJ SOAJ injection Use as directed for severe allergic reaction 08/18/19   Garnet Sierras, DO  esomeprazole (NEXIUM) 40 MG capsule Take 1  capsule (40 mg total) by mouth 2 (two) times daily. 11/10/18   Kozlow, Donnamarie Poag, MD  FASENRA 30 MG/ML SOSY INJECT 30MG  SUBCUTANEOUSLY EVERY 8 WEEKS 07/23/19   Kozlow, Donnamarie Poag, MD  fluticasone [facility]) 50 MCG/ACT nasal spray SHAKE LIQUID AND USE 2 SPRAYS IN [facility] NOSTRIL DAILY 05/26/20   Kozlow, Donnamarie Poag, MD  gabapentin (NEURONTIN) 100 MG capsule  12/14/19   [provider]  hydrOXYzine (ATARAX/VISTARIL) 10 MG tablet  11/29/19   [provider]  JARDIANCE 25 MG TABS tablet  12/27/19   [provider]  ketoconazole (NIZORAL) 2 % cream  11/29/19   [provider]  levothyroxine (SYNTHROID, LEVOTHROID) 50 MCG tablet take 1 tablet by mouth once daily 08/04/15   Renato Shin, MD  methylPREDNISolone (MEDROL DOSEPAK) 4 MG TBPK tablet 6 day dose pack - take as directed Patient not taking: Reported on 04/17/2020 03/28/20   Tyson Dense T, DPM  metoprolol succinate (TOPROL-XL) 25 MG 24 hr tablet TAKE 1 TABLET(25 MG) BY MOUTH DAILY 11/29/19   Hilty, Nadean Corwin, MD  montelukast (SINGULAIR) 10 MG tablet TAKE 1 TABLET(10 MG) BY MOUTH AT BEDTIME 08/18/19   Garnet Sierras, DO  moxifloxacin (AVELOX) 400 MG  tablet  12/20/19   [provider]  olmesartan-hydrochlorothiazide (BENICAR HCT) 40-12.5 MG tablet TK 1 T PO D 08/31/18   [provider]  Potassium Chloride ER 20 MEQ TBCR  07/01/18   [provider]  potassium chloride SA (K-DUR) 20 MEQ tablet take 1 tablet by mouth once daily 03/10/17   [provider]  QUEtiapine (SEROQUEL) 25 MG tablet  10/25/19   [provider]  rosuvastatin (CRESTOR) 40 MG tablet TAKE 1 TABLET(40 MG) BY MOUTH DAILY 12/06/19   Hilty, Nadean Corwin, MD  topiramate (TOPAMAX) 50 MG tablet TAKE 1/2 TABLET BY MOUTH DAILY THEN IN 1 WEEK INCREASE TO 1 DAILY 07/01/18   [provider]    Family History Family History  Problem Relation Age of Onset  . Diabetes Father   . Heart failure Father   . Hypertension Father   . Thyroid  disease Sister   . Hypertension Sister     Social History Social History   Tobacco Use  . Smoking status: Former Smoker    Quit date: 02/04/2006    Years since quitting: 14.3  . Smokeless tobacco: Never Used  Vaping Use  . Vaping Use: Never used  Substance Use Topics  . Alcohol use: No    Alcohol/week: 0.0 standard drinks  . Drug use: No     Allergies   Bee venom and Penicillins   Review of Systems Review of Systems Pertinent negatives listed in HPI  Physical Exam Triage Vital Signs ED Triage Vitals  Enc Vitals Group     BP 05/23/20 1546 126/85     Pulse Rate 05/23/20 1546 60     Resp 05/23/20 1546 16     Temp 05/23/20 1546 98 F (36.7 C)     Temp Source 05/23/20 1546 Oral     SpO2 05/23/20 1546 98 %     Weight --      Height --      Head Circumference --      Peak Flow --      Pain Score 05/23/20 1544 0     Pain Loc --      Pain Edu? --      Excl. in [place]? --    No data found.  Updated Vital Signs BP 126/85 (BP Location: Left Arm)   Pulse 60   Temp 98 F (36.7 C) (Oral)   Resp 16   SpO2 98%   Visual Acuity Right Eye Distance:   Left Eye Distance:   Bilateral Distance:    Right Eye Near:   Left Eye Near:    Bilateral Near:     Physical Exam General appearance: alert, Ill-appearing, no distress Head: Normocephalic, without obvious abnormality, atraumatic IRJ:JOAC normal, Nares- mucosal edema, congestion, erythematous oropharynx w/o exudate Respiratory: Respirations even , unlabored, coarse lung sound, expiratory wheeze Heart: rate and rhythm normal. No gallop or murmurs noted on exam  Abdomen: BS +, no distention, no rebound tenderness, or no mass Extremities: No gross deformities Skin: Skin color, texture, turgor normal. No rashes seen  Psych: Appropriate mood and affect.   UC Treatments / Results  Labs (all labs ordered are listed, but only abnormal results are displayed) Labs Reviewed - No data to display  EKG   Radiology No  results found.  Procedures Procedures (including critical care time)  Medications Ordered in UC Medications - No data to display  Initial Impression / Assessment and Plan / UC Course  I have reviewed the triage vital  signs and the nursing notes.  Pertinent labs & imaging results that were available during my care of the patient were reviewed by me and considered in my medical decision making (see chart for details).     Wheezing and cough symptoms related to viral  respiratory illness.Symptom management only, resume duo nebs and promethazine DM. Will avoid prednisone patient receive immunotherapy within the last week. Follow-up with asthma specialist as needed. ER if symptom worsen. Final Clinical Impressions(s) / UC    Final diagnoses:  Cough  Wheezing  Viral URI with cough   Discharge Instructions   None    ED Prescriptions    Medication Sig Dispense Auth. Provider   ipratropium-albuterol (DUONEB) 0.5-2.5 (3) MG/3ML SOLN Take 3 mLs by nebulization every 6 (six) hours as needed. 360 mL Scot Jun, FNP   promethazine-dextromethorphan (PROMETHAZINE-DM) 6.25-15 MG/5ML syrup Take 5 mLs by mouth 4 (four) times daily as needed for cough. 180 mL Scot Jun, FNP     PDMP not reviewed this encounter.   Scot Jun, [place] 05/30/20 [phone]

## 2020-05-23 NOTE — ED Triage Notes (Signed)
Pt presents with headache, sinus pain and pressure, congestion and dry cough that started last night. States she was outside a lot over the weekend and has hx of asthma. Has been using OTC sinus medications and inhaler.

## 2020-05-26 ENCOUNTER — Other Ambulatory Visit: Payer: Self-pay | Admitting: Allergy and Immunology

## 2020-06-01 ENCOUNTER — Encounter: Payer: Self-pay | Admitting: Podiatry

## 2020-06-01 ENCOUNTER — Ambulatory Visit (INDEPENDENT_AMBULATORY_CARE_PROVIDER_SITE_OTHER): Payer: Medicare HMO | Admitting: Podiatry

## 2020-06-01 ENCOUNTER — Other Ambulatory Visit: Payer: Self-pay

## 2020-06-01 DIAGNOSIS — M722 Plantar fascial fibromatosis: Secondary | ICD-10-CM

## 2020-06-01 DIAGNOSIS — R0989 Other specified symptoms and signs involving the circulatory and respiratory systems: Secondary | ICD-10-CM

## 2020-06-01 DIAGNOSIS — I872 Venous insufficiency (chronic) (peripheral): Secondary | ICD-10-CM | POA: Diagnosis not present

## 2020-06-01 MED ORDER — TRIAMCINOLONE ACETONIDE 40 MG/ML IJ SUSP
20.0000 mg | Freq: Once | INTRAMUSCULAR | Status: AC
  Administered 2020-06-01: 20 mg

## 2020-06-01 NOTE — Progress Notes (Signed)
She presents today after having visited the vascular department with concerns of why her left foot still hurts.  States that she has been wearing her compression hose and prescribed by the vascular doctors and is due for follow-up therefore venous ablation possibly.  Objective: Vital signs are stable she alert oriented x3.  Still has edema to the left lower extremity still has lipomas to the medial lateral ankle.  She also has pain on palpation medial calcaneal tubercle of the left heel.  Assessment: Edema left lower extremity venous stasis insufficiency [place] fasciitis left.  Lipomas left.  Plan: Discussed etiology pathology and surgical therapies at this point I injected her left heel today 20 mg Kenalog 5 mg Marcaine point maximal tenderness.  Tolerated procedure well.  She will follow-up with vascular as directed.  She will wear her compression hose.  Follow-up with her in 1 month to 6 weeks.

## 2020-06-21 ENCOUNTER — Other Ambulatory Visit: Payer: Self-pay

## 2020-06-21 ENCOUNTER — Ambulatory Visit (INDEPENDENT_AMBULATORY_CARE_PROVIDER_SITE_OTHER): Payer: Medicare HMO

## 2020-06-21 DIAGNOSIS — T63441D Toxic effect of venom of bees, accidental (unintentional), subsequent encounter: Secondary | ICD-10-CM | POA: Diagnosis not present

## 2020-06-23 ENCOUNTER — Ambulatory Visit (INDEPENDENT_AMBULATORY_CARE_PROVIDER_SITE_OTHER): Payer: Medicare HMO | Admitting: *Deleted

## 2020-06-23 DIAGNOSIS — J309 Allergic rhinitis, unspecified: Secondary | ICD-10-CM

## 2020-06-28 ENCOUNTER — Other Ambulatory Visit: Payer: Self-pay | Admitting: Allergy

## 2020-07-04 ENCOUNTER — Telehealth: Payer: Self-pay | Admitting: Podiatry

## 2020-07-04 NOTE — Telephone Encounter (Signed)
Called patient lvm to reschedule 6/9 appt

## 2020-07-06 ENCOUNTER — Ambulatory Visit (INDEPENDENT_AMBULATORY_CARE_PROVIDER_SITE_OTHER): Payer: Medicare HMO | Admitting: *Deleted

## 2020-07-06 ENCOUNTER — Other Ambulatory Visit: Payer: Medicare HMO

## 2020-07-06 DIAGNOSIS — J309 Allergic rhinitis, unspecified: Secondary | ICD-10-CM

## 2020-07-07 ENCOUNTER — Encounter: Payer: Self-pay | Admitting: Podiatry

## 2020-07-07 ENCOUNTER — Other Ambulatory Visit: Payer: Self-pay

## 2020-07-07 ENCOUNTER — Ambulatory Visit
Admission: EM | Admit: 2020-07-07 | Discharge: 2020-07-07 | Disposition: A | Discharge status: Home / Self Care | Payer: Medicare HMO | Attending: Internal Medicine | Admitting: Internal Medicine

## 2020-07-07 ENCOUNTER — Telehealth: Payer: Self-pay | Admitting: Hematology and Oncology

## 2020-07-07 ENCOUNTER — Ambulatory Visit (INDEPENDENT_AMBULATORY_CARE_PROVIDER_SITE_OTHER): Payer: Medicare HMO

## 2020-07-07 DIAGNOSIS — M25532 Pain in left wrist: Secondary | ICD-10-CM

## 2020-07-07 DIAGNOSIS — S60212A Contusion of left wrist, initial encounter: Secondary | ICD-10-CM

## 2020-07-07 DIAGNOSIS — W19XXXA Unspecified fall, initial encounter: Secondary | ICD-10-CM

## 2020-07-07 DIAGNOSIS — W57XXXA Bitten or stung by nonvenomous insect and other nonvenomous arthropods, initial encounter: Secondary | ICD-10-CM

## 2020-07-07 DIAGNOSIS — S30861A Insect bite (nonvenomous) of abdominal wall, initial encounter: Secondary | ICD-10-CM

## 2020-07-07 DIAGNOSIS — I10 Essential (primary) hypertension: Secondary | ICD-10-CM

## 2020-07-07 NOTE — Discharge Instructions (Addendum)
-  Your left wrist is bruised.  This will heal on its own.  Use the wrist brace while you are having pain for the next few days. -If your wrist hurts, this is your body telling you that it needs to avoid doing that action.  Try to avoid doing things that hurt. -Tylenol for pain, up to 1000 mg 3 times daily.  Try to avoid ibuprofen. -Please check your blood pressure at home or at the pharmacy. If this continues to be >140/90, follow-up with your primary care provider for further blood pressure management/ medication titration. If you develop chest pain, shortness of breath, vision changes, the worst headache of your life- head straight to the ED or call 911. -You have some insect bites of your abdominal wall.  This is not shingles, and is not infection.  Use over-the-counter hydrocortisone cream.  If the rash gets worse, or becomes angry warm and red-seek additional immediate medical attention.

## 2020-07-07 NOTE — Telephone Encounter (Signed)
Called pt to r/s lab appt per 6/3 sch msg. No answer. Left msg for pt to call back to r/s.

## 2020-07-07 NOTE — ED Triage Notes (Addendum)
Pt presents with injury to left wrist, reports she was at the store and a line of buggies ran into her arm. Her left wrist is swollen, painful, with limited range of motion. Pt also complains of small red bumps across her abdomen that are painful.

## 2020-07-07 NOTE — ED Provider Notes (Signed)
[name]    CSN: [id number] Arrival date & time: 07/07/20  1906      History   Chief Complaint Chief Complaint  Patient presents with  . Arm Injury    HPI Andrea Lowery is a 80 y.o. female presenting with left wrist pain and swelling following accident at grocery store. She also raises concern of insect bites adbominal wall. Also concern of elevated blood pressure..  Medical history arthritis, asthma, diabetes, DVT, dyslipidemia, eczema, GERD, headaches, hypertension, hypothyroid, left bundle branch block, migraines, hypokalemia, CHF, extremity edema, osteoporosis, impingement syndrome.  -States that she was at the grocery store a few hours ago when a line of buggies ran into her left arm.  Since then, the left wrist has been swollen and painful with decreased range of motion due to pain.  Denies sensation changes.  Denies pain or injury elsewhere.  Denies falls.  Denies dizziness before or after the fall. Adamant that she did not hit her head. Not on long term anticoagulation.  -Also with significantly elevated blood pressure, states she takes her antihypertensives in the morning and did take them today.  Denies chest pain, shortness of breath, dizziness, weakness. -Endorses few itchy insect bites on her stomach.  States she is concerned this is shingles.  Denies fever/chills, discharge from the area  HPI  Past Medical History:  Diagnosis Date  . Arthritis   . Asthma   . Diabetes mellitus without complication ([place])    Diabetes II  . DVT (deep venous thrombosis) ([place])    "Years ago"  . Dyslipidemia   . Eczema   . GERD (gastroesophageal reflux disease)   . Headache    Migraine  . Hypertension   . Hypothyroidism   . LBBB (left bundle branch block)   . MGUS (monoclonal gammopathy of unknown significance)   . MGUS (monoclonal gammopathy of unknown significance)   . Migraine   . OSA on CPAP     Patient Active Problem List   Diagnosis Date Noted  .  Constipation 04/17/2020  . 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 Protrusion of lumbar intervertebral disc 09/13/2019  . Seasonal and perennial allergic rhinitis 08/18/2019  . 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  . No-show for appointment 07/07/2019  . Elevated serum creatinine 06/05/2018  . Hyperglycemia 06/05/2018  . Arthralgia 08/02/2016  . Eczema 08/02/2016  . Hypokalemia 04/30/2016  . Impingement syndrome of left shoulder 04/05/2016  . Carotid artery disease ([place]) 01/19/2016  . Dyslipidemia 01/19/2016  . Acute severe exacerbation of asthma 01/10/2016  . 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 Noncompliance 11/20/2015  . Seborrheic dermatitis of scalp 11/20/2015  . Edema of extremities 11/20/2015  . History of lumbar laminectomy for spinal cord decompression 07/31/2015  . Candidal skin infection 06/14/2015  . Osteoporosis without current pathological fracture 05/30/2015  . Chronic pain of right hip 05/02/2015  . Chronic right-sided low back pain with right-sided sciatica 03/31/2015  . Temporal arteritis ([place]) 11/07/2014  . Headache 11/07/2014  . Diabetes mellitus type 2, controlled ([place]) 11/07/2014  . Diastolic CHF ([place]) 03/50/0938  . Sacroiliac dysfunction 11/04/2014  . Trochanteric bursitis of right hip 11/04/2014  . Asthma 10/05/2014  . GERD (gastroesophageal reflux disease) 10/05/2014  . Allergic rhinitis 10/05/2014  . Hypothyroidism 09/21/2014  . Multinodular goiter 08/09/2014  . Disorder of sacroiliac joint 07/29/2014  . 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 Thyroid nodule 07/01/2014  . Hypersomnolence 04/16/2014  . Snoring 04/16/2014  .  Anxiety 03/18/2014  . Preventive measure 01/25/2014  . Difficulty sleeping 12/28/2013  . Hyperlipidemia 12/27/2013  . Acute diastolic CHF (congestive heart failure), NYHA class 2 ([place]) 07/29/2013  . Bilateral leg edema 07/20/2013  . Edema  07/20/2013  . 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 Monoclonal paraproteinemia 05/20/2013  . Essential hypertension 07/01/2012  . LBBB (left bundle branch block) 07/01/2012  . DOE (dyspnea on exertion) 07/01/2012  . OSA on CPAP 07/01/2012  . Abnormal respiratory rate 07/01/2012    Past Surgical History:  Procedure Laterality Date  . ABDOMINAL HYSTERECTOMY  1969  . ARTERY BIOPSY N/A 11/07/2014   Procedure: BIOPSY TEMPORAL ARTERY;  Surgeon: Leta Baptist, MD;  Location: [facility];  Service: ENT;  Laterality: N/A;  . CARPAL TUNNEL RELEASE Right   . CHOLECYSTECTOMY  1976  . LUMBAR LAMINECTOMY/DECOMPRESSION MICRODISCECTOMY N/A 07/31/2015   Procedure: L4-5 Decompression, Possible Right L4-5 Microdiscectomy;  Surgeon: Marybelle Killings, MD;  Location: [facility];  Service: Orthopedics;  Laterality: N/A;  . NM MYOVIEW LTD  02/13/2010   No ischemia  . PLANTAR FASCIA RELEASE    . TONSILLECTOMY  1965  . US ECHOCARDIOGRAPHY  09/20/2008   borderline LVH,mild TR,AOV mildly sclerotic w/ca+ of the leaflets    OB History   No obstetric history on file.      Home Medications    Prior to Admission medications   Medication Sig Start Date End Date Taking? Authorizing Provider  ACCU-CHEK AVIVA PLUS test strip  12/01/19   [provider]  Accu-Chek Softclix Lancets lancets  12/01/19   [provider]  albuterol (VENTOLIN HFA) 108 (90 Base) MCG/ACT inhaler INHALE 2 PUFFS INTO THE LUNGS EVERY 4 HOURS AS NEEDED FOR WHEEZING OR SHORTNESS OF BREATH 04/20/20   Kozlow, Donnamarie Poag, MD  Alcohol Swabs (B-D SINGLE USE SWABS REGULAR) PADS  01/04/20   [provider]  alendronate (FOSAMAX) 70 MG tablet Take 70 mg by mouth every Wednesday.  05/30/15   [provider]  budesonide-formoterol (SYMBICORT) 160-4.5 MCG/ACT inhaler INHALE 2 PUFFS BY MOUTH INTO THE LUNGS TWICE DAILY. RINSE, GARGLE AND SPIT AFTER USE 02/08/20   Kozlow, Donnamarie Poag, MD  cetirizine (ZYRTEC) 10 MG tablet Take 1  tablet 1-2 times a day for allergies. 02/08/20   Kozlow, Donnamarie Poag, MD  chlorthalidone (HYGROTON) 25 MG tablet  12/14/19   [provider]  cycloSPORINE (RESTASIS) 0.05 % ophthalmic emulsion Place one drop into both eyes 2 (two) times daily. Patient not taking: Reported on 04/17/2020 11/20/15   [provider]  divalproex (DEPAKOTE) 125 MG DR tablet  12/06/19   [provider]  EPINEPHrine 0.3 mg/0.3 mL IJ SOAJ injection Use as directed for severe allergic reaction 08/18/19   Garnet Sierras, DO  esomeprazole (NEXIUM) 40 MG capsule Take 1 capsule (40 mg total) by mouth 2 (two) times daily. 11/10/18   Kozlow, Donnamarie Poag, MD  FASENRA 30 MG/ML SOSY INJECT 30MG  SUBCUTANEOUSLY EVERY 8 WEEKS 07/23/19   Kozlow, Donnamarie Poag, MD  fluticasone [facility]) 50 MCG/ACT nasal spray SHAKE LIQUID AND USE 2 SPRAYS IN [facility] NOSTRIL DAILY 05/26/20   Kozlow, Donnamarie Poag, MD  gabapentin (NEURONTIN) 100 MG capsule  12/14/19   [provider]  hydrOXYzine (ATARAX/VISTARIL) 10 MG tablet  11/29/19   [provider]  ipratropium-albuterol (DUONEB) 0.5-2.5 (3) MG/3ML SOLN Take 3 mLs by nebulization every 6 (six) hours as needed. 05/23/20   Scot Jun, FNP  JARDIANCE 25 MG TABS tablet  12/27/19  [provider]  ketoconazole (NIZORAL) 2 % cream  11/29/19   [provider]  levothyroxine (SYNTHROID, LEVOTHROID) 50 MCG tablet take 1 tablet by mouth once daily 08/04/15   Renato Shin, MD  methylPREDNISolone (MEDROL DOSEPAK) 4 MG TBPK tablet 6 day dose pack - take as directed Patient not taking: Reported on 04/17/2020 03/28/20   Tyson Dense T, DPM  metoprolol succinate (TOPROL-XL) 25 MG 24 hr tablet TAKE 1 TABLET(25 MG) BY MOUTH DAILY 11/29/19   Hilty, Nadean Corwin, MD  montelukast (SINGULAIR) 10 MG tablet TAKE 1 TABLET(10 MG) BY MOUTH AT BEDTIME 06/28/20   Kozlow, Donnamarie Poag, MD  moxifloxacin (AVELOX) 400 MG tablet  12/20/19   [provider]  olmesartan-hydrochlorothiazide (BENICAR HCT)  40-12.5 MG tablet TK 1 T PO D 08/31/18   [provider]  Potassium Chloride ER 20 MEQ TBCR  07/01/18   [provider]  potassium chloride SA (K-DUR) 20 MEQ tablet take 1 tablet by mouth once daily 03/10/17   [provider]  promethazine-dextromethorphan (PROMETHAZINE-DM) 6.25-15 MG/5ML syrup Take 5 mLs by mouth 4 (four) times daily as needed for cough. 05/23/20   Scot Jun, FNP  QUEtiapine (SEROQUEL) 25 MG tablet  10/25/19   [provider]  rosuvastatin (CRESTOR) 40 MG tablet TAKE 1 TABLET(40 MG) BY MOUTH DAILY 12/06/19   Hilty, Nadean Corwin, MD  topiramate (TOPAMAX) 50 MG tablet TAKE 1/2 TABLET BY MOUTH DAILY THEN IN 1 WEEK INCREASE TO 1 DAILY 07/01/18   [provider]    Family History Family History  Problem Relation Age of Onset  . Diabetes Father   . Heart failure Father   . Hypertension Father   . Thyroid disease Sister   . Hypertension Sister     Social History Social History   Tobacco Use  . Smoking status: Former Smoker    Quit date: 02/04/2006    Years since quitting: 14.4  . Smokeless tobacco: Never Used  Vaping Use  . Vaping Use: Never used  Substance Use Topics  . Alcohol use: No    Alcohol/week: 0.0 standard drinks  . Drug use: No     Allergies   Bee venom and Penicillins   Review of Systems Review of Systems  Musculoskeletal:       L wrist pain  Skin:       Insect bites  All other systems reviewed and are negative.    Physical Exam Triage Vital Signs ED Triage Vitals  Enc Vitals Group     BP      Pulse      Resp      Temp      Temp src      SpO2      Weight      Height      Head Circumference      Peak Flow      Pain Score      Pain Loc      Pain Edu?      Excl. in [place]?    No data found.  Updated Vital Signs BP (!) 197/92   Pulse 78   Temp 98 F (36.7 C)   Resp 18   SpO2 97%   Visual Acuity Right Eye Distance:   Left Eye Distance:   Bilateral Distance:    Right Eye Near:    Left Eye Near:    Bilateral Near:     Physical Exam Vitals reviewed.  Constitutional:  Appearance: Normal appearance. She is not diaphoretic.  HENT:     Head: Normocephalic and atraumatic.     Mouth/Throat:     Mouth: Mucous membranes are moist.  Eyes:     Extraocular Movements: Extraocular movements intact.     Pupils: Pupils are equal, round, and reactive to light.  Cardiovascular:     Rate and Rhythm: Normal rate and regular rhythm.     Pulses:          Radial pulses are 2+ on the right side and 2+ on the left side.     Heart sounds: Normal heart sounds.  Pulmonary:     Effort: Pulmonary effort is normal.     Breath sounds: Normal breath sounds.  Abdominal:     Palpations: Abdomen is soft.     Tenderness: There is no abdominal tenderness. There is no guarding or rebound.  Musculoskeletal:     Right lower leg: No edema.     Left lower leg: No edema.     Comments: L wrist with mild effusion and tenderness distal radius. No bony abnormality. No abrasion, ecchymosis. ROM wrist intact but with discomfort. ROM fingers intact and without pain. No elbow pain; ROM intact and without pain. Grip strength 5/5. No snuffbox tenderness. Sensation intact. Cap refill <2 seconds, radial pulse 2+. Absolutely no other injury, deformity, tenderness.   Skin:    General: Skin is warm.     Capillary Refill: Capillary refill takes less than 2 seconds.     Comments: abd wall with 3 minute erythematous insect bites. No induration, warmth.   Neurological:     General: No focal deficit present.     Mental Status: She is alert and oriented to person, place, and time.     Comments: PERRLA, EOMI Strength 5/5 in UEs and LEs CN 2-12 grossly intact  Psychiatric:        Mood and Affect: Mood normal.        Behavior: Behavior normal.        Thought Content: Thought content normal.        Judgment: Judgment normal.      UC Treatments / Results  Labs (all labs ordered are listed, but only  abnormal results are displayed) Labs Reviewed - No data to display  EKG   Radiology DG Wrist Complete Left  Result Date: 07/07/2020 CLINICAL DATA:  Fall, left wrist pain EXAM: LEFT WRIST - COMPLETE 3+ VIEW COMPARISON:  None. FINDINGS: No acute bony abnormality. Specifically, no fracture, subluxation, or dislocation. Mild degenerative changes at the 1st carpometacarpal joint. IMPRESSION: No acute bony abnormality. Electronically Signed   By: Rolm Baptise M.D.   On: 07/07/2020 19:24    Procedures Procedures (including critical care time)  Medications Ordered in UC Medications - No data to display  Initial Impression / Assessment and Plan / UC Course  I have reviewed the triage vital signs and the nursing notes.  Pertinent labs & imaging results that were available during my care of the patient were reviewed by me and considered in my medical decision making (see chart for details).     This patient is a 80 year old female presenting with L wrist contusion. Neurovascularly intact.  Xray L wrist - No acute bony abnormality For hypertension, continue current regimen. Check BP at home.   Tylenol for discomfort. Wrist brace provided at patient request.   OTC hydrocortisone for insect bites. No infection  ED return precautions discussed.   Final Clinical Impressions(s) / UC  Diagnoses   Final diagnoses:  Essential hypertension  Contusion of left wrist, initial encounter  Insect bite of abdominal wall, initial encounter     Discharge Instructions     -Your left wrist is bruised.  This will heal on its own.  Use the wrist brace while you are having pain for the next few days. -If your wrist hurts, this is your body telling you that it needs to avoid doing that action.  Try to avoid doing things that hurt. -Tylenol for pain, up to 1000 mg 3 times daily.  Try to avoid ibuprofen. -Please check your blood pressure at home or at the pharmacy. If this continues to be >140/90,  follow-up with your primary care provider for further blood pressure management/ medication titration. If you develop chest pain, shortness of breath, vision changes, the worst headache of your life- head straight to the ED or call 911. -You have some insect bites of your abdominal wall.  This is not shingles, and is not infection.  Use over-the-counter hydrocortisone cream.  If the rash gets worse, or becomes angry warm and red-seek additional immediate medical attention.    ED Prescriptions    None     PDMP not reviewed this encounter.   Hazel Sams, PA-C 07/07/20 2104

## 2020-07-11 DIAGNOSIS — J3089 Other allergic rhinitis: Secondary | ICD-10-CM

## 2020-07-12 DIAGNOSIS — J302 Other seasonal allergic rhinitis: Secondary | ICD-10-CM

## 2020-07-12 LAB — HM DIABETES EYE EXAM

## 2020-07-12 NOTE — Progress Notes (Signed)
VIALS MADE & EXP 07-11-21

## 2020-07-13 ENCOUNTER — Ambulatory Visit: Payer: Medicare HMO | Admitting: Podiatry

## 2020-07-13 ENCOUNTER — Other Ambulatory Visit: Payer: Self-pay

## 2020-07-13 ENCOUNTER — Ambulatory Visit (INDEPENDENT_AMBULATORY_CARE_PROVIDER_SITE_OTHER): Payer: Medicare HMO | Admitting: *Deleted

## 2020-07-13 DIAGNOSIS — J4550 Severe persistent asthma, uncomplicated: Secondary | ICD-10-CM

## 2020-07-17 ENCOUNTER — Telehealth: Payer: Self-pay

## 2020-07-17 ENCOUNTER — Inpatient Hospital Stay: Payer: Medicare HMO

## 2020-07-17 ENCOUNTER — Other Ambulatory Visit: Payer: Self-pay

## 2020-07-17 ENCOUNTER — Inpatient Hospital Stay: Payer: Medicare HMO | Attending: Hematology and Oncology | Admitting: Hematology and Oncology

## 2020-07-17 DIAGNOSIS — I10 Essential (primary) hypertension: Secondary | ICD-10-CM

## 2020-07-17 DIAGNOSIS — Z7951 Long term (current) use of inhaled steroids: Secondary | ICD-10-CM | POA: Insufficient documentation

## 2020-07-17 DIAGNOSIS — D472 Monoclonal gammopathy: Secondary | ICD-10-CM | POA: Diagnosis present

## 2020-07-17 DIAGNOSIS — Z79899 Other long term (current) drug therapy: Secondary | ICD-10-CM | POA: Insufficient documentation

## 2020-07-17 DIAGNOSIS — R600 Localized edema: Secondary | ICD-10-CM | POA: Diagnosis not present

## 2020-07-17 DIAGNOSIS — E876 Hypokalemia: Secondary | ICD-10-CM | POA: Diagnosis not present

## 2020-07-17 LAB — CBC WITH DIFFERENTIAL/PLATELET
Abs Immature Granulocytes: 0.01 10*3/uL (ref 0.00–0.07)
Basophils Absolute: 0 10*3/uL (ref 0.0–0.1)
Basophils Relative: 0 %
Eosinophils Absolute: 0 10*3/uL (ref 0.0–0.5)
Eosinophils Relative: 0 %
HCT: 36.3 % (ref 36.0–46.0)
Hemoglobin: 12.7 g/dL (ref 12.0–15.0)
Immature Granulocytes: 0 %
Lymphocytes Relative: 29 %
Lymphs Abs: 1.5 10*3/uL (ref 0.7–4.0)
MCH: 27.7 pg (ref 26.0–34.0)
MCHC: 35 g/dL (ref 30.0–36.0)
MCV: 79.3 fL — ABNORMAL LOW (ref 80.0–100.0)
Monocytes Absolute: 0.5 10*3/uL (ref 0.1–1.0)
Monocytes Relative: 9 %
Neutro Abs: 3.2 10*3/uL (ref 1.7–7.7)
Neutrophils Relative %: 62 %
Platelets: 208 10*3/uL (ref 150–400)
RBC: 4.58 MIL/uL (ref 3.87–5.11)
RDW: 13.7 % (ref 11.5–15.5)
WBC: 5.1 10*3/uL (ref 4.0–10.5)
nRBC: 0 % (ref 0.0–0.2)

## 2020-07-17 LAB — COMPREHENSIVE METABOLIC PANEL
ALT: 23 U/L (ref 0–44)
AST: 22 U/L (ref 15–41)
Albumin: 3.8 g/dL (ref 3.5–5.0)
Alkaline Phosphatase: 64 U/L (ref 38–126)
Anion gap: 11 (ref 5–15)
BUN: 10 mg/dL (ref 8–23)
CO2: 32 mmol/L (ref 22–32)
Calcium: 9.8 mg/dL (ref 8.9–10.3)
Chloride: 94 mmol/L — ABNORMAL LOW (ref 98–111)
Creatinine, Ser: 1.02 mg/dL — ABNORMAL HIGH (ref 0.44–1.00)
GFR, Estimated: 56 mL/min — ABNORMAL LOW (ref >60)
Glucose, Bld: 212 mg/dL — ABNORMAL HIGH (ref 70–99)
Potassium: 2.3 mmol/L — CL (ref 3.5–5.1)
Sodium: 137 mmol/L (ref 135–145)
Total Bilirubin: 0.7 mg/dL (ref 0.3–1.2)
Total Protein: 8 g/dL (ref 6.5–8.1)

## 2020-07-17 NOTE — Progress Notes (Signed)
CRITICAL VALUE STICKER  CRITICAL VALUE: Potassium 2.3  RECEIVER (on-site recipient of call):Bryan Omura Wynetta Emery, [place] NOTIFIED: 07/17/20 at [place] (representative from lab): J. Scotton.  MD NOTIFIED: Dr. Alvy Bimler.  TIME OF NOTIFICATION: 1:20 pm 07/17/20  RESPONSE:  See phone note.

## 2020-07-17 NOTE — Telephone Encounter (Signed)
Called per Dr. Alvy Bimler. Potassium 2.3, stop Chlorthalidone and continue Potassium. Call PCP for follow up. She verbalized understanding.

## 2020-07-18 ENCOUNTER — Encounter: Payer: Self-pay | Admitting: Hematology and Oncology

## 2020-07-18 LAB — KAPPA/LAMBDA LIGHT CHAINS
Kappa free light chain: 84.8 mg/L — ABNORMAL HIGH (ref 3.3–19.4)
Kappa, lambda light chain ratio: 7.01 — ABNORMAL HIGH (ref 0.26–1.65)
Lambda free light chains: 12.1 mg/L (ref 5.7–26.3)

## 2020-07-18 NOTE — Assessment & Plan Note (Signed)
Clinically, she has no signs of disease progression. Her myeloma panel is pending, we will call her with test results If her myeloma panel is stable, I will see her once a year

## 2020-07-18 NOTE — Assessment & Plan Note (Signed)
She has significant hypokalemia likely due to her diuretic therapy I recommend her to hold chlorthalidone and to take a potassium supplement I recommend further evaluation with her primary care doctor to see if an alternative diuretic therapy is available

## 2020-07-18 NOTE — Assessment & Plan Note (Signed)
Her blood pressure is high The patient stated she is taking all her medications as prescribed I would defer to her primary care doctor for further management

## 2020-07-18 NOTE — Assessment & Plan Note (Signed)
She has bilateral lower extremity edema She is on a diuretic therapy but developed significant hypokalemia I recommend her to hold diuretic for while and to see primary care doctor soon as possible for further management

## 2020-07-18 NOTE — Progress Notes (Signed)
[facility] OFFICE PROGRESS NOTE  Patient Care Team: Hayden Rasmussen, MD as PCP - General (Family Medicine)  ASSESSMENT & PLAN:  MGUS (monoclonal gammopathy of unknown significance) Clinically, she has no signs of disease progression. Her myeloma panel is pending, we will call her with test results If her myeloma panel is stable, I will see her once a year  Essential hypertension Her blood pressure is high The patient stated she is taking all her medications as prescribed I would defer to her primary care doctor for further management  Bilateral leg edema She has bilateral lower extremity edema She is on a diuretic therapy but developed significant hypokalemia I recommend her to hold diuretic for while and to see primary care doctor soon as possible for further management  Hypokalemia She has significant hypokalemia likely due to her diuretic therapy I recommend her to hold chlorthalidone and to take a potassium supplement I recommend further evaluation with her primary care doctor to see if an alternative diuretic therapy is available  No orders of the defined types were placed in this encounter.   All questions were answered. The patient knows to call the clinic with any problems, questions or concerns. The total time spent in the appointment was 20 minutes encounter with patients including review of chart and various tests results, discussions about plan of care and coordination of care plan   Heath Lark, MD 07/18/2020 7:47 AM  INTERVAL HISTORY: Please see below for problem oriented charting. She was lost to follow-up She canceled last year's appointment because of sudden death of her grandson She is still grieving over the loss She denies bone pain She is complaining of significant bilateral lower extremity edema No clinical signs or symptoms of congestive heart failure such as chest pain or shortness of breath Denies recent infection  SUMMARY OF  ONCOLOGIC HISTORY:  I reviewed the patient's records extensive and collaborated the history with the patient. Summary of her history is as follows: She has a long-standing history of multiple arthralgias, left CMC pain, OA of the bilateral knees, hands and feet and osteoporosis for which she is on Prolia previously. She had SPEP drawn on 02/19/2013 that revealed a restricted band with monoclonal protein present, IgG kappa subtype. The monoclonal protein peak accounted for 1.38 g/dL of the total 1.66 g/dL of protein in the gamma region. Her CMP demonstrated a creatinine of 0.81 and calcium of 9.0 and total protein of 7.2 and albumin of 3.9; Her CBC was within normal limits with a hemoglobin of 12.5. Skeletal x-ray show no evidence of lytic lesions. She had a bone marrow biopsy on 06/22/2013 which show 10% of plasma cells. She was observed.  REVIEW OF SYSTEMS:   Constitutional: Denies fevers, chills or abnormal weight loss Eyes: Denies blurriness of vision Ears, nose, mouth, throat, and face: Denies mucositis or sore throat Respiratory: Denies cough, dyspnea or wheezes Cardiovascular: Denies palpitation, chest discomfort Gastrointestinal:  Denies nausea, heartburn or change in bowel habits Skin: Denies abnormal skin rashes Lymphatics: Denies new lymphadenopathy or easy bruising Neurological:Denies numbness, tingling or new weaknesses Behavioral/Psych: Mood is stable, no new changes  All other systems were reviewed with the patient and are negative.  I have reviewed the past medical history, past surgical history, social history and family history with the patient and they are unchanged from previous note.  ALLERGIES:  is allergic to bee venom and penicillins.  MEDICATIONS:  Current Outpatient Medications  Medication Sig Dispense Refill   ACCU-CHEK  AVIVA PLUS test strip      Accu-Chek Softclix Lancets lancets      albuterol (VENTOLIN HFA) 108 (90 Base) MCG/ACT inhaler INHALE 2 PUFFS INTO THE  LUNGS EVERY 4 HOURS AS NEEDED FOR WHEEZING OR SHORTNESS OF BREATH 18 g 1   Alcohol Swabs (B-D SINGLE USE SWABS REGULAR) PADS      alendronate (FOSAMAX) 70 MG tablet Take 70 mg by mouth every Wednesday.      budesonide-formoterol (SYMBICORT) 160-4.5 MCG/ACT inhaler INHALE 2 PUFFS BY MOUTH INTO THE LUNGS TWICE DAILY. RINSE, GARGLE AND SPIT AFTER USE 10.2 g 5   cetirizine (ZYRTEC) 10 MG tablet Take 1 tablet 1-2 times a day for allergies. 180 tablet 1   chlorthalidone (HYGROTON) 25 MG tablet      cycloSPORINE (RESTASIS) 0.05 % ophthalmic emulsion Place one drop into both eyes 2 (two) times daily. (Patient not taking: Reported on 04/17/2020)     EPINEPHrine 0.3 mg/0.3 mL IJ SOAJ injection Use as directed for severe allergic reaction 2 each 1   esomeprazole (NEXIUM) 40 MG capsule Take 1 capsule (40 mg total) by mouth 2 (two) times daily. 180 capsule 1   FASENRA 30 MG/ML SOSY INJECT 30MG SUBCUTANEOUSLY EVERY 8 WEEKS 1 mL 6   fluticasone (FLONASE) 50 MCG/ACT nasal spray SHAKE LIQUID AND USE 2 SPRAYS IN EACH NOSTRIL DAILY 48 g 0   gabapentin (NEURONTIN) 100 MG capsule      hydrOXYzine (ATARAX/VISTARIL) 10 MG tablet  (Patient not taking: Reported on 04/17/2020)     ipratropium-albuterol (DUONEB) 0.5-2.5 (3) MG/3ML SOLN Take 3 mLs by nebulization every 6 (six) hours as needed. 360 mL 0   JARDIANCE 25 MG TABS tablet      ketoconazole (NIZORAL) 2 % cream      levothyroxine (SYNTHROID, LEVOTHROID) 50 MCG tablet take 1 tablet by mouth once daily 90 tablet 0   metoprolol succinate (TOPROL-XL) 25 MG 24 hr tablet TAKE 1 TABLET(25 MG) BY MOUTH DAILY 90 tablet 3   montelukast (SINGULAIR) 10 MG tablet TAKE 1 TABLET(10 MG) BY MOUTH AT BEDTIME 90 tablet 0   Potassium Chloride ER 20 MEQ TBCR      potassium chloride SA (K-DUR) 20 MEQ tablet take 1 tablet by mouth once daily     rosuvastatin (CRESTOR) 40 MG tablet TAKE 1 TABLET(40 MG) BY MOUTH DAILY 90 tablet 3   topiramate (TOPAMAX) 50 MG tablet TAKE 1/2 TABLET BY MOUTH  DAILY THEN IN 1 WEEK INCREASE TO 1 DAILY     Current Facility-Administered Medications  Medication Dose Route Frequency Provider Last Rate Last Admin   Benralizumab SOSY 30 mg  30 mg Subcutaneous Q8 Weeks Kozlow, Donnamarie Poag, MD   30 mg at 07/13/20 1445    PHYSICAL EXAMINATION: ECOG PERFORMANCE STATUS: 1 - Symptomatic but completely ambulatory  Vitals:   07/17/20 1205  BP: (!) 173/90  Pulse: 80  Resp: 18  Temp: (!) 97 F (36.1 C)  SpO2: 97%   Filed Weights   07/17/20 1205  Weight: 184 lb 9.6 oz (83.7 kg)    GENERAL:alert, no distress and comfortable SKIN: skin color, texture, turgor are normal, no rashes or significant lesions EYES: normal, Conjunctiva are pink and non-injected, sclera clear OROPHARYNX:no exudate, no erythema and lips, buccal mucosa, and tongue normal  NECK: supple, thyroid normal size, non-tender, without nodularity LYMPH:  no palpable lymphadenopathy in the cervical, axillary or inguinal LUNGS: clear to auscultation and percussion with normal breathing effort HEART: regular rate & rhythm and no  murmurs with moderate bilateral lower extremity edema ABDOMEN:abdomen soft, non-tender and normal bowel sounds Musculoskeletal:no cyanosis of digits and no clubbing  NEURO: alert & oriented x 3 with fluent speech, no focal motor/sensory deficits  LABORATORY DATA:  I have reviewed the data as listed    Component Value Date/Time   NA 137 07/17/2020 1231   NA 138 11/23/2018 1457   NA 140 04/23/2016 1303   K 2.3 (LL) 07/17/2020 1231   K 2.8 (LL) 04/23/2016 1303   CL 94 (L) 07/17/2020 1231   CO2 32 07/17/2020 1231   CO2 28 04/23/2016 1303   GLUCOSE 212 (H) 07/17/2020 1231   GLUCOSE 156 (H) 04/23/2016 1303   BUN 10 07/17/2020 1231   BUN 9 11/23/2018 1457   BUN 6.9 (L) 04/23/2016 1303   CREATININE 1.02 (H) 07/17/2020 1231   CREATININE 1.01 (H) 05/29/2018 1141   CREATININE 0.9 04/23/2016 1303   CALCIUM 9.8 07/17/2020 1231   CALCIUM 9.7 04/23/2016 1303   PROT 8.0  07/17/2020 1231   PROT 7.4 04/23/2016 1303   PROT 8.0 04/23/2016 1303   ALBUMIN 3.8 07/17/2020 1231   ALBUMIN 3.9 04/23/2016 1303   AST 22 07/17/2020 1231   AST 24 05/29/2018 1141   AST 19 04/23/2016 1303   ALT 23 07/17/2020 1231   ALT 26 05/29/2018 1141   ALT 24 04/23/2016 1303   ALKPHOS 64 07/17/2020 1231   ALKPHOS 65 04/23/2016 1303   BILITOT 0.7 07/17/2020 1231   BILITOT 0.5 05/29/2018 1141   BILITOT 0.53 04/23/2016 1303   GFRNONAA 56 (L) 07/17/2020 1231   GFRNONAA 54 (L) 05/29/2018 1141   GFRAA >60 02/02/2019 0339   GFRAA >60 05/29/2018 1141    No results found for: SPEP, UPEP  Lab Results  Component Value Date   WBC 5.1 07/17/2020   NEUTROABS 3.2 07/17/2020   HGB 12.7 07/17/2020   HCT 36.3 07/17/2020   MCV 79.3 (L) 07/17/2020   PLT 208 07/17/2020      Chemistry      Component Value Date/Time   NA 137 07/17/2020 1231   NA 138 11/23/2018 1457   NA 140 04/23/2016 1303   K 2.3 (LL) 07/17/2020 1231   K 2.8 (LL) 04/23/2016 1303   CL 94 (L) 07/17/2020 1231   CO2 32 07/17/2020 1231   CO2 28 04/23/2016 1303   BUN 10 07/17/2020 1231   BUN 9 11/23/2018 1457   BUN 6.9 (L) 04/23/2016 1303   CREATININE 1.02 (H) 07/17/2020 1231   CREATININE 1.01 (H) 05/29/2018 1141   CREATININE 0.9 04/23/2016 1303      Component Value Date/Time   CALCIUM 9.8 07/17/2020 1231   CALCIUM 9.7 04/23/2016 1303   ALKPHOS 64 07/17/2020 1231   ALKPHOS 65 04/23/2016 1303   AST 22 07/17/2020 1231   AST 24 05/29/2018 1141   AST 19 04/23/2016 1303   ALT 23 07/17/2020 1231   ALT 26 05/29/2018 1141   ALT 24 04/23/2016 1303   BILITOT 0.7 07/17/2020 1231   BILITOT 0.5 05/29/2018 1141   BILITOT 0.53 04/23/2016 1303       RADIOGRAPHIC STUDIES: I have personally reviewed the radiological images as listed and agreed with the findings in the report. DG Wrist Complete Left  Result Date: 07/07/2020 CLINICAL DATA:  Fall, left wrist pain EXAM: LEFT WRIST - COMPLETE 3+ VIEW COMPARISON:  None.  FINDINGS: No acute bony abnormality. Specifically, no fracture, subluxation, or dislocation. Mild degenerative changes at the 1st carpometacarpal joint. IMPRESSION: No  acute bony abnormality. Electronically Signed   By: Rolm Baptise M.D.   On: 07/07/2020 19:24

## 2020-07-19 LAB — MULTIPLE MYELOMA PANEL, SERUM
Albumin SerPl Elph-Mcnc: 3.9 g/dL (ref 2.9–4.4)
Albumin/Glob SerPl: 1.2 (ref 0.7–1.7)
Alpha 1: 0.2 g/dL (ref 0.0–0.4)
Alpha2 Glob SerPl Elph-Mcnc: 0.7 g/dL (ref 0.4–1.0)
B-Globulin SerPl Elph-Mcnc: 1.1 g/dL (ref 0.7–1.3)
Gamma Glob SerPl Elph-Mcnc: 1.5 g/dL (ref 0.4–1.8)
Globulin, Total: 3.5 g/dL (ref 2.2–3.9)
IgA: 138 mg/dL (ref 64–422)
IgG (Immunoglobin G), Serum: 1727 mg/dL — ABNORMAL HIGH (ref 586–1602)
IgM (Immunoglobulin M), Srm: 48 mg/dL (ref 26–217)
M Protein SerPl Elph-Mcnc: 1.2 g/dL — ABNORMAL HIGH
Total Protein ELP: 7.4 g/dL (ref 6.0–8.5)

## 2020-07-20 ENCOUNTER — Telehealth: Payer: Self-pay

## 2020-07-20 NOTE — Telephone Encounter (Signed)
-----   Message from Heath Lark, MD sent at 07/20/2020  8:30 AM EDT ----- Pls tell her myeloma panel is stable I will put LOS to see her in 1 year

## 2020-07-20 NOTE — Telephone Encounter (Signed)
Called and left below message. Ask her to call the office back for questions. 

## 2020-07-25 ENCOUNTER — Telehealth: Payer: Self-pay

## 2020-07-25 ENCOUNTER — Telehealth: Payer: Self-pay | Admitting: Hematology and Oncology

## 2020-07-25 NOTE — Telephone Encounter (Signed)
Returned her and reviewed appts for 2023. She verbalized understanding.  She has seen her PCP regarding bp/ repeat of labs. She will follow up with PCP again tomorrow.

## 2020-07-25 NOTE — Telephone Encounter (Signed)
Scheduled appointment per 06/17 sch msg. Left message.

## 2020-07-27 ENCOUNTER — Ambulatory Visit (INDEPENDENT_AMBULATORY_CARE_PROVIDER_SITE_OTHER): Payer: Medicare HMO | Admitting: Vascular Surgery

## 2020-07-27 ENCOUNTER — Encounter: Payer: Self-pay | Admitting: Vascular Surgery

## 2020-07-27 ENCOUNTER — Other Ambulatory Visit: Payer: Self-pay

## 2020-07-27 VITALS — BP 155/74 | HR 73 | Temp 97.9°F | Resp 14 | Ht 66.0 in | Wt 192.0 lb

## 2020-07-27 DIAGNOSIS — I872 Venous insufficiency (chronic) (peripheral): Secondary | ICD-10-CM

## 2020-07-27 NOTE — Progress Notes (Addendum)
REASON FOR VISIT:   Follow-up of chronic venous insufficiency.  MEDICAL ISSUES:   CHRONIC VENOUS INSUFFICIENCY: This patient has symptoms of venous hypertension and is failed conservative treatment.  She has CEAP C3 venous disease. She has reflux in the superficial system only on the left involving the great saphenous vein.  I think she would be a candidate for laser ablation of the left great saphenous vein down to the distal thigh.  I do not think that this will resolve her lower extremity swelling which is likely multifactorial.  I think she does have some mild underlying lymphedema.  However she has significant aching and heaviness in the left leg consistent with symptoms from venous hypertension.  I have discussed the indications for endovenous laser ablation of the left GSV, that is to lower the pressure in the veins and potentially help relieve the symptoms from venous hypertension. I have also discussed alternative options including conservative treatment with leg elevation, compression therapy, exercise, avoiding prolonged sitting and standing, and weight management. I have discussed the potential complications of the procedure, including, but not limited to: bleeding, bruising, leg swelling, significant pain from phlebitis, deep venous thrombosis, or failure of the vein to close.  I have also explained that venous insufficiency is a chronic disease, and that the patient is at risk for recurrent varicose veins in the future.  All of the patient's questions were encouraged and answered. They are agreeable to proceed.  I will have [facility] [employer] call her to schedule surgery.   HPI:   Andrea Lowery is a pleasant 80 y.o. female who was seen in our office on 04/17/2020 by Gerri Lins, PA with swelling in both ankles.  This had gradually begun about a year ago and was worsened with prolonged sitting and standing.  She has no history of DVT.  On my history, the patient has had aching pain and  heaviness in the left leg for over a year.  The symptoms have been gradually progressing.  I do not get any history of claudication or rest pain.  Her symptoms are worse at the end of the day.  The symptoms are aggravated by standing and sitting.  Her symptoms are alleviated with elevation.  She has been wearing her thigh-high compression stockings which do help some.  She has no previous history of DVT and no previous venous procedures.  She does have a history of some congestive heart failure.  Past Medical History:  Diagnosis Date   Arthritis    Asthma    Diabetes mellitus without complication ([place])    Diabetes II   DVT (deep venous thrombosis) ([place])    "Years ago"   Dyslipidemia    Eczema    GERD (gastroesophageal reflux disease)    Headache    Migraine   Hypertension    Hypothyroidism    LBBB (left bundle branch block)    MGUS (monoclonal gammopathy of unknown significance)    MGUS (monoclonal gammopathy of unknown significance)    Migraine    OSA on CPAP     Family History  Problem Relation Age of Onset   Diabetes Father    Heart failure Father    Hypertension Father    Thyroid disease Sister    Hypertension Sister     SOCIAL HISTORY: Social History   Tobacco Use   Smoking status: Former    Pack years: 0.00    Types: Cigarettes    Quit date: 02/04/2006    Years since  quitting: 14.4   Smokeless tobacco: Never  Substance Use Topics   Alcohol use: No    Alcohol/week: 0.0 standard drinks    Allergies  Allergen Reactions   Bee Venom Anaphylaxis    Yellow jackets Yellow jackets   Penicillins Hives    Has patient had a PCN reaction causing immediate rash, facial/tongue/throat swelling, SOB or lightheadedness with hypotension: Yes Has patient had a PCN reaction causing severe rash involving mucus membranes or skin necrosis: No Has patient had a PCN reaction that required hospitalization No Has patient had a PCN reaction occurring within the last 10 years: No If  all of the above answers are "NO", then may proceed with Cephalosporin use.     Current Outpatient Medications  Medication Sig Dispense Refill   ACCU-CHEK AVIVA PLUS test strip      Accu-Chek Softclix Lancets lancets      albuterol (VENTOLIN HFA) 108 (90 Base) MCG/ACT inhaler INHALE 2 PUFFS INTO THE LUNGS EVERY 4 HOURS AS NEEDED FOR WHEEZING OR SHORTNESS OF BREATH 18 g 1   Alcohol Swabs (B-D SINGLE USE SWABS REGULAR) PADS      alendronate (FOSAMAX) 70 MG tablet Take 70 mg by mouth every Wednesday.      budesonide-formoterol (SYMBICORT) 160-4.5 MCG/ACT inhaler INHALE 2 PUFFS BY MOUTH INTO THE LUNGS TWICE DAILY. RINSE, GARGLE AND SPIT AFTER USE 10.2 g 5   cetirizine (ZYRTEC) 10 MG tablet Take 1 tablet 1-2 times a day for allergies. 180 tablet 1   chlorthalidone (HYGROTON) 25 MG tablet      cycloSPORINE (RESTASIS) 0.05 % ophthalmic emulsion Place one drop into both eyes 2 (two) times daily. (Patient not taking: Reported on 04/17/2020)     EPINEPHrine 0.3 mg/0.3 mL IJ SOAJ injection Use as directed for severe allergic reaction 2 each 1   esomeprazole (NEXIUM) 40 MG capsule Take 1 capsule (40 mg total) by mouth 2 (two) times daily. 180 capsule 1   FASENRA 30 MG/ML SOSY INJECT 30MG  SUBCUTANEOUSLY EVERY 8 WEEKS 1 mL 6   fluticasone (FLONASE) 50 MCG/ACT nasal spray SHAKE LIQUID AND USE 2 SPRAYS IN EACH NOSTRIL DAILY 48 g 0   gabapentin (NEURONTIN) 100 MG capsule      hydrOXYzine (ATARAX/VISTARIL) 10 MG tablet  (Patient not taking: Reported on 04/17/2020)     ipratropium-albuterol (DUONEB) 0.5-2.5 (3) MG/3ML SOLN Take 3 mLs by nebulization every 6 (six) hours as needed. 360 mL 0   JARDIANCE 25 MG TABS tablet      ketoconazole (NIZORAL) 2 % cream      levothyroxine (SYNTHROID, LEVOTHROID) 50 MCG tablet take 1 tablet by mouth once daily 90 tablet 0   metoprolol succinate (TOPROL-XL) 25 MG 24 hr tablet TAKE 1 TABLET(25 MG) BY MOUTH DAILY 90 tablet 3   montelukast (SINGULAIR) 10 MG tablet TAKE 1 TABLET(10  MG) BY MOUTH AT BEDTIME 90 tablet 0   Potassium Chloride ER 20 MEQ TBCR      potassium chloride SA (K-DUR) 20 MEQ tablet take 1 tablet by mouth once daily     rosuvastatin (CRESTOR) 40 MG tablet TAKE 1 TABLET(40 MG) BY MOUTH DAILY 90 tablet 3   topiramate (TOPAMAX) 50 MG tablet TAKE 1/2 TABLET BY MOUTH DAILY THEN IN 1 WEEK INCREASE TO 1 DAILY     Current Facility-Administered Medications  Medication Dose Route Frequency Provider Last Rate Last Admin   Benralizumab SOSY 30 mg  30 mg Subcutaneous Q8 Weeks Kozlow, Donnamarie Poag, MD   30 mg at 07/13/20  1445    REVIEW OF SYSTEMS:  [X]  denotes positive finding, [ ]  denotes negative finding Cardiac  Comments:  Chest pain or chest pressure:    Shortness of breath upon exertion:    Short of breath when lying flat:    Irregular heart rhythm:        Vascular    Pain in calf, thigh, or hip brought on by ambulation: x   Pain in feet at night that wakes you up from your sleep:     Blood clot in your veins:    Leg swelling:  x       Pulmonary    Oxygen at home:    Productive cough:     Wheezing:  x       Neurologic    Sudden weakness in arms or legs:     Sudden numbness in arms or legs:     Sudden onset of difficulty speaking or slurred speech:    Temporary loss of vision in one eye:     Problems with dizziness:         Gastrointestinal    Blood in stool:     Vomited blood:         Genitourinary    Burning when urinating:     Blood in urine:        Psychiatric    Major depression:         Hematologic    Bleeding problems:    Problems with blood clotting too easily:        Skin    Rashes or ulcers:        Constitutional    Fever or chills:     PHYSICAL EXAM:   There were no vitals filed for this visit.  GENERAL: The patient is a well-nourished female, in no acute distress. The vital signs are documented above. CARDIAC: There is a regular rate and rhythm.  VASCULAR: I do not detect carotid bruits. I could not palpate pedal  pulses. On the right side she had a biphasic lateral tarsal signal and posterior tibial signal. On the left side she had a biphasic dorsalis pedis signal with a monophasic posterior tibial signal. She had bilateral lower extremity swelling. I did look at her left great saphenous vein myself and she has reflux down to the knee.  We would likely cannulate the vein in the distal thigh. PULMONARY: There is good air exchange bilaterally without wheezing or rales.  Patient has ABDOMEN: Soft and non-tender with normal pitched bowel sounds.  MUSCULOSKELETAL: There are no major deformities or cyanosis. NEUROLOGIC: No focal weakness or paresthesias are detected. SKIN: There are no ulcers or rashes noted. PSYCHIATRIC: The patient has a normal affect.  DATA:    VENOUS DUPLEX: I have reviewed the venous duplex scan that was done in February of this year.  On the right side there is no evidence of DVT or superficial venous thrombosis.  There is no deep venous reflux.  There is no significant superficial venous reflux.  On the left side there is no evidence of DVT or superficial venous thrombosis.  There is no deep venous reflux.  There is reflux in the superficial system involving the great saphenous vein from the saphenofemoral junction to the proximal calf.  Diameters of the vein ranged from 4.5-5.7 mm.     Deitra Mayo [facility] [phone]

## 2020-08-01 ENCOUNTER — Ambulatory Visit (INDEPENDENT_AMBULATORY_CARE_PROVIDER_SITE_OTHER): Payer: Medicare HMO | Admitting: Podiatry

## 2020-08-01 ENCOUNTER — Other Ambulatory Visit: Payer: Self-pay

## 2020-08-01 ENCOUNTER — Encounter: Payer: Self-pay | Admitting: Podiatry

## 2020-08-01 DIAGNOSIS — M722 Plantar fascial fibromatosis: Secondary | ICD-10-CM

## 2020-08-01 DIAGNOSIS — M7752 Other enthesopathy of left foot: Secondary | ICD-10-CM

## 2020-08-02 DIAGNOSIS — M7752 Other enthesopathy of left foot: Secondary | ICD-10-CM | POA: Diagnosis not present

## 2020-08-02 MED ORDER — TRIAMCINOLONE ACETONIDE 40 MG/ML IJ SUSP
20.0000 mg | Freq: Once | INTRAMUSCULAR | Status: AC
  Administered 2020-08-02: 20 mg

## 2020-08-02 NOTE — Progress Notes (Signed)
She presents today for follow-up of her left heel states that is sore but has been feeling better than it has been as of late.  Patient also continues to wear compression stocking because of the venous insufficiency in her leg.  She has been seen by vascular who states that they are going to need to perform a procedure on her.  Objective: Vital signs are stable she is alert and oriented x3 she has tenderness on palpation of the subtalar joint otherwise the plantar fascia and the posterior heels doing well.  Lipoma present anterior lateral ankle left.  Assessment: Subtalar joint capsulitis and lipoma.  Plan: I injected the subtalar joint capsule today 20 mg Kenalog 5 mg Marcaine.  She will return to [place] on an as-needed basis.

## 2020-08-04 ENCOUNTER — Other Ambulatory Visit: Payer: Self-pay | Admitting: *Deleted

## 2020-08-04 DIAGNOSIS — I83812 Varicose veins of left lower extremities with pain: Secondary | ICD-10-CM

## 2020-08-15 ENCOUNTER — Other Ambulatory Visit: Payer: Self-pay | Admitting: *Deleted

## 2020-08-15 MED ORDER — LORAZEPAM 1 MG PO TABS: ORAL_TABLET | ORAL | 0 refills | Status: DC

## 2020-08-16 ENCOUNTER — Other Ambulatory Visit: Payer: Self-pay

## 2020-08-16 ENCOUNTER — Ambulatory Visit (INDEPENDENT_AMBULATORY_CARE_PROVIDER_SITE_OTHER): Payer: Medicare HMO

## 2020-08-16 DIAGNOSIS — T63441D Toxic effect of venom of bees, accidental (unintentional), subsequent encounter: Secondary | ICD-10-CM | POA: Diagnosis not present

## 2020-08-18 ENCOUNTER — Ambulatory Visit (INDEPENDENT_AMBULATORY_CARE_PROVIDER_SITE_OTHER): Payer: Medicare HMO

## 2020-08-18 DIAGNOSIS — J309 Allergic rhinitis, unspecified: Secondary | ICD-10-CM | POA: Diagnosis not present

## 2020-08-19 ENCOUNTER — Other Ambulatory Visit: Payer: Self-pay | Admitting: Allergy and Immunology

## 2020-08-19 DIAGNOSIS — J4550 Severe persistent asthma, uncomplicated: Secondary | ICD-10-CM

## 2020-08-19 DIAGNOSIS — I97131 Postprocedural heart failure following other surgery: Secondary | ICD-10-CM

## 2020-08-21 ENCOUNTER — Other Ambulatory Visit: Payer: Self-pay | Admitting: Allergy and Immunology

## 2020-08-24 ENCOUNTER — Ambulatory Visit (INDEPENDENT_AMBULATORY_CARE_PROVIDER_SITE_OTHER): Payer: Medicare HMO | Admitting: Vascular Surgery

## 2020-08-24 ENCOUNTER — Other Ambulatory Visit: Payer: Self-pay

## 2020-08-24 ENCOUNTER — Encounter: Payer: Self-pay | Admitting: Vascular Surgery

## 2020-08-24 VITALS — BP 157/88 | HR 66 | Temp 97.3°F | Resp 16 | Ht 67.0 in | Wt 184.0 lb

## 2020-08-24 DIAGNOSIS — I83812 Varicose veins of left lower extremities with pain: Secondary | ICD-10-CM

## 2020-08-24 HISTORY — PX: ENDOVENOUS ABLATION SAPHENOUS VEIN W/ LASER: SUR449

## 2020-08-24 NOTE — Progress Notes (Signed)
     Laser Ablation Procedure    Date: 08/24/2020   Andrea Lowery DOB:[id number]  Consent signed: Yes      Surgeon: Gae Gallop MD   Procedure: Laser Ablation: left Greater Saphenous Vein  BP (!) 157/88 (BP Location: Left Arm, Patient Position: Sitting, Cuff Size: Large)   Pulse 66   Temp (!) 97.3 F (36.3 C) (Temporal)   Resp 16   Ht 5\' 7"  (1.702 m)   Wt 184 lb (83.5 kg)   SpO2 99%   BMI 28.82 kg/m   Tumescent Anesthesia: 450 cc 0.9% NaCl with 50 cc Lidocaine HCL 1%  and 15 cc 8.4% NaHCO3  Local Anesthesia: 3 cc Lidocaine HCL and NaHCO3 (ratio 2:1)  7 watts continuous mode     Total energy: 1442 Joules    Total time: 206 seconds Treatment Length  32 cm  Laser Fiber Ref. #   [id number]    Lot #  [id number]    Patient tolerated procedure well  Notes: Patient wore face mask.  All staff members wore facial masks and facial shields/goggles.  [name] took Ativan 1 mg on 08-24-2020 at 10:00 AM and at 10:20 AM.    Description of Procedure:  After marking the course of the secondary varicosities, the patient was placed on the operating table in the supine position, and the left leg was prepped and draped in sterile fashion.   Local anesthetic was administered and under ultrasound guidance the saphenous vein was accessed with a micro needle and guide wire; then the mirco puncture sheath was placed.  A guide wire was inserted saphenofemoral junction , followed by a 5 french sheath.  The position of the sheath and then the laser fiber below the junction was confirmed using the ultrasound.  Tumescent anesthesia was administered along the course of the saphenous vein using ultrasound guidance. The patient was placed in Trendelenburg position and protective laser glasses were placed on patient and staff, and the laser was fired at 7 watts continuous mode for a total of 1442 joules.       Steri strips were applied to the IV insertion site and ABD pads and thigh high  compression stockings were applied.  Ace wrap bandages were applied over the left thigh and at the top of the saphenofemoral junction. Blood loss was less than 15 cc.  Discharge instructions reviewed with patient and hardcopy of discharge instructions given to patient to take home. The patient was taken out to car by wheelchair  out of the operating room having tolerated the procedure well.

## 2020-08-24 NOTE — Progress Notes (Signed)
Patient name: Andrea Lowery MRN: [id number] DOB: April 01, 1940 Sex: female  REASON FOR VISIT: For laser ablation of the left great saphenous vein  HPI: Andrea Lowery is a 80 y.o. female who I saw on 07/27/2020 with chronic venous insufficiency.  She had CEAP C3 venous disease.  She had failed conservative treatment and I felt she was a candidate for laser ablation of the left great saphenous vein.  She had reflux in the left great saphenous vein down to the distal thigh.  Current Outpatient Medications  Medication Sig Dispense Refill   ACCU-CHEK AVIVA PLUS test strip      Accu-Chek Softclix Lancets lancets      albuterol (VENTOLIN HFA) 108 (90 Base) MCG/ACT inhaler INHALE 2 PUFFS INTO THE LUNGS EVERY 4 HOURS AS NEEDED FOR WHEEZING OR SHORTNESS OF BREATH 18 g 1   Alcohol Swabs (B-D SINGLE USE SWABS REGULAR) PADS      alendronate (FOSAMAX) 70 MG tablet Take 70 mg by mouth every Wednesday.      budesonide-formoterol (SYMBICORT) 160-4.5 MCG/ACT inhaler INHALE 2 PUFFS BY MOUTH INTO THE LUNGS TWICE DAILY. RINSE, GARGLE AND SPIT AFTER USE 10.2 g 5   cetirizine (ZYRTEC) 10 MG tablet Take 1 tablet 1-2 times a day for allergies. 180 tablet 1   chlorthalidone (HYGROTON) 25 MG tablet      cycloSPORINE (RESTASIS) 0.05 % ophthalmic emulsion Place one drop into both eyes 2 (two) times daily.     EPINEPHrine 0.3 mg/0.3 mL IJ SOAJ injection Use as directed for severe allergic reaction 2 each 1   esomeprazole (NEXIUM) 40 MG capsule Take 1 capsule (40 mg total) by mouth 2 (two) times daily. 180 capsule 1   FASENRA 30 MG/ML SOSY INJECT '30MG'$  SUBCUTANEOUSLY EVERY 8 WEEKS 1 mL 6   fluticasone (FLONASE) 50 MCG/ACT nasal spray SHAKE LIQUID AND USE 2 SPRAYS IN EACH NOSTRIL DAILY 48 g 0   gabapentin (NEURONTIN) 100 MG capsule      hydrOXYzine (ATARAX/VISTARIL) 10 MG tablet      ipratropium-albuterol (DUONEB) 0.5-2.5 (3) MG/3ML SOLN Take 3 mLs by nebulization every 6 (six) hours as needed. 360 mL 0   JARDIANCE  25 MG TABS tablet      ketoconazole (NIZORAL) 2 % cream      levothyroxine (SYNTHROID, LEVOTHROID) 50 MCG tablet take 1 tablet by mouth once daily 90 tablet 0   LORazepam (ATIVAN) 1 MG tablet Take 1 tablet 30 minutes prior to leaving house on day of office surgery.  Bring second tablet with you to office in day of office surgery. 2 tablet 0   metoprolol succinate (TOPROL-XL) 25 MG 24 hr tablet TAKE 1 TABLET(25 MG) BY MOUTH DAILY 90 tablet 3   montelukast (SINGULAIR) 10 MG tablet TAKE 1 TABLET(10 MG) BY MOUTH AT BEDTIME 90 tablet 0   Potassium Chloride ER 20 MEQ TBCR      potassium chloride SA (K-DUR) 20 MEQ tablet take 1 tablet by mouth once daily     rosuvastatin (CRESTOR) 40 MG tablet TAKE 1 TABLET(40 MG) BY MOUTH DAILY 90 tablet 3   topiramate (TOPAMAX) 50 MG tablet TAKE 1/2 TABLET BY MOUTH DAILY THEN IN 1 WEEK INCREASE TO 1 DAILY     Current Facility-Administered Medications  Medication Dose Route Frequency Provider Last Rate Last Admin   Benralizumab SOSY 30 mg  30 mg Subcutaneous Q8 Weeks Kozlow, Donnamarie Poag, MD   30 mg at 07/13/20 1445    PHYSICAL EXAM: Vitals:   08/24/20  1038  BP: (!) 157/88  Pulse: 66  Resp: 16  Temp: (!) 97.3 F (36.3 C)  TempSrc: Temporal  SpO2: 99%  Weight: 184 lb (83.5 kg)  Height: '5\' 7"'$  (1.702 m)    PROCEDURE: Laser ablation left great saphenous vein  TECHNIQUE: The patient was taken to the exam room and placed supine.  I looked at the left great saphenous vein myself with the SonoSite and I felt we could cannulate this in the distal thigh.  The left leg was prepped and draped in usual sterile fashion.  Under ultrasound guidance, after the skin was anesthetized, I cannulated the great saphenous vein in the distal thigh with a micropuncture sheath needle and a micropuncture sheath was introduced over a wire.  I then advanced the J-wire to just below the saphenofemoral junction under ultrasound guidance.  The 26 sonometer sheath was then advanced over the wire  and the wire and dilator were removed.  Sheath was positioned 2.5 cm distal to the saphenofemoral junction which we measured.  Next the laser fiber was positioned at the end of the sheath and the sheath retracted.  Next tumescent anesthesia was administered circumferentially around the vein.  The patient was then placed in Trendelenburg.  Laser ablation was performed from 2.5 cm distal to the saphenofemoral junction to the distal thigh.  50 J/cm was used at 23 W.  A pressure dressing was applied.  The patient tolerated the procedure well.  She will return in 1 week for a follow-up duplex  Deitra Mayo [facility] [place]

## 2020-08-30 ENCOUNTER — Other Ambulatory Visit: Payer: Self-pay

## 2020-08-30 ENCOUNTER — Ambulatory Visit (INDEPENDENT_AMBULATORY_CARE_PROVIDER_SITE_OTHER): Payer: Medicare HMO | Admitting: Dermatology

## 2020-08-30 DIAGNOSIS — L918 Other hypertrophic disorders of the skin: Secondary | ICD-10-CM

## 2020-08-30 DIAGNOSIS — L821 Other seborrheic keratosis: Secondary | ICD-10-CM | POA: Diagnosis not present

## 2020-08-30 DIAGNOSIS — L720 Epidermal cyst: Secondary | ICD-10-CM | POA: Diagnosis not present

## 2020-08-30 DIAGNOSIS — Z1283 Encounter for screening for malignant neoplasm of skin: Secondary | ICD-10-CM | POA: Diagnosis not present

## 2020-08-30 DIAGNOSIS — L259 Unspecified contact dermatitis, unspecified cause: Secondary | ICD-10-CM | POA: Diagnosis not present

## 2020-08-30 MED ORDER — TRIAMCINOLONE ACETONIDE 0.1 % EX CREA: 1.0000 "application " | TOPICAL_CREAM | Freq: Every day | CUTANEOUS | 2 refills | Status: DC

## 2020-08-30 NOTE — Patient Instructions (Addendum)
Use topical cream after bathing. Do not use the cream on the face or folds.  Contact your insurance company to see if they will cover removal of cyst. Give them the following codes 11442, 11422, and 11402     Seborrheic Keratosis A seborrheic keratosis is a common, noncancerous (benign) skin growth. These growths are velvety, waxy, rough, tan, brown, or black spots that appear on the skin. These skin growths can be flat or raised, andscaly. What are the causes? The cause of this condition is not known. What increases the risk? You are more likely to develop this condition if you: Have a family history of seborrheic keratosis. Are 50 or older. Are pregnant. Have had estrogen replacement therapy. What are the signs or symptoms? Symptoms of this condition include growths on the face, chest, shoulders, back, or other areas. These growths: Are usually painless, but may become irritated and itchy. Can be yellow, brown, black, or other colors. Are slightly raised or have a flat surface. Are sometimes rough or wart-like in texture. Are often velvety or waxy on the surface. Are round or oval-shaped. Often occur in groups, but may occur as a single growth. How is this diagnosed? This condition is diagnosed with a medical history and physical exam. A sample of the growth may be tested (skin biopsy). You may need to see a skin specialist (dermatologist). How is this treated? Treatment is not usually needed for this condition, unless the growths are irritated or bleed often. You may also choose to have the growths removed if you do not like their appearance. Most commonly, these growths are treated with a procedure in which liquid nitrogen is applied to "freeze" off the growth (cryosurgery). They may also be burned off with electricity (electrocautery) or removed by scraping (curettage). Follow these instructions at home: Watch your growth for any changes. Keep all follow-up visits as told by  your health care provider. This is important. Do not scratch or pick at the growth or growths. This can cause them to become irritated or infected. Contact a health care provider if: You suddenly have many new growths. Your growth bleeds, itches, or hurts. Your growth suddenly becomes larger or changes color. Summary A seborrheic keratosis is a common, noncancerous (benign) skin growth. Treatment is not usually needed for this condition, unless the growths are irritated or bleed often. Watch your growth for any changes. Contact a health care provider if you suddenly have many new growths or your growth suddenly becomes larger or changes color. Keep all follow-up visits as told by your health care provider. This is important. This information is not intended to replace advice given to you by your health care provider. Make sure you discuss any questions you have with your healthcare provider. Document Revised: 06/05/2017 Document Reviewed: 06/05/2017 Elsevier Patient Education  2022 [employer].

## 2020-08-31 ENCOUNTER — Ambulatory Visit (INDEPENDENT_AMBULATORY_CARE_PROVIDER_SITE_OTHER): Payer: Medicare HMO | Admitting: Vascular Surgery

## 2020-08-31 ENCOUNTER — Ambulatory Visit (HOSPITAL_COMMUNITY)
Admission: RE | Admit: 2020-08-31 | Discharge: 2020-08-31 | Disposition: A | Discharge status: Home / Self Care | Payer: Medicare HMO | Source: Ambulatory Visit | Attending: Vascular Surgery | Admitting: Vascular Surgery

## 2020-08-31 ENCOUNTER — Encounter: Payer: Self-pay | Admitting: Vascular Surgery

## 2020-08-31 ENCOUNTER — Other Ambulatory Visit: Payer: Self-pay | Admitting: Allergy and Immunology

## 2020-08-31 VITALS — BP 170/84 | HR 62 | Temp 97.6°F | Resp 16 | Ht 67.0 in | Wt 190.0 lb

## 2020-08-31 DIAGNOSIS — I83812 Varicose veins of left lower extremities with pain: Secondary | ICD-10-CM | POA: Diagnosis present

## 2020-08-31 NOTE — Progress Notes (Signed)
Patient name: Andrea Lowery MRN: [id number] DOB: 1940/02/11 Sex: female  REASON FOR VISIT: Follow-up after laser ablation left great saphenous vein  HPI: Andrea Lowery is a 80 y.o. female who presented with CEAP C3 venous disease.  She had failed conservative treatment and was felt to be a good candidate for laser ablation of the left great saphenous vein.  On 08/24/2020 she underwent laser ablation of the left great saphenous vein from 2-1/2 cm distal to the saphenofemoral junction to the distal thigh.  She comes in for routine follow-up visit.  Today she has no specific complaints.  Current Outpatient Medications  Medication Sig Dispense Refill   ACCU-CHEK AVIVA PLUS test strip      Accu-Chek Softclix Lancets lancets      albuterol (VENTOLIN HFA) 108 (90 Base) MCG/ACT inhaler INHALE 2 PUFFS INTO THE LUNGS EVERY 4 HOURS AS NEEDED FOR WHEEZING OR SHORTNESS OF BREATH 18 g 1   Alcohol Swabs (B-D SINGLE USE SWABS REGULAR) PADS      alendronate (FOSAMAX) 70 MG tablet Take 70 mg by mouth every Wednesday.      budesonide-formoterol (SYMBICORT) 160-4.5 MCG/ACT inhaler INHALE 2 PUFFS BY MOUTH INTO THE LUNGS TWICE DAILY. RINSE, GARGLE AND SPIT AFTER USE 10.2 g 5   cetirizine (ZYRTEC) 10 MG tablet Take 1 tablet 1-2 times a day for allergies. 180 tablet 1   chlorthalidone (HYGROTON) 25 MG tablet      cycloSPORINE (RESTASIS) 0.05 % ophthalmic emulsion Place one drop into both eyes 2 (two) times daily.     EPINEPHrine 0.3 mg/0.3 mL IJ SOAJ injection Use as directed for severe allergic reaction 2 each 1   esomeprazole (NEXIUM) 40 MG capsule Take 1 capsule (40 mg total) by mouth 2 (two) times daily. 180 capsule 1   FASENRA 30 MG/ML SOSY INJECT '30MG'$  SUBCUTANEOUSLY EVERY 8 WEEKS 1 mL 6   fluticasone (FLONASE) 50 MCG/ACT nasal spray SHAKE LIQUID AND USE 2 SPRAYS IN EACH NOSTRIL DAILY 48 g 0   gabapentin (NEURONTIN) 100 MG capsule      hydrOXYzine (ATARAX/VISTARIL) 10 MG tablet       ipratropium-albuterol (DUONEB) 0.5-2.5 (3) MG/3ML SOLN Take 3 mLs by nebulization every 6 (six) hours as needed. 360 mL 0   JARDIANCE 25 MG TABS tablet      ketoconazole (NIZORAL) 2 % cream      levothyroxine (SYNTHROID, LEVOTHROID) 50 MCG tablet take 1 tablet by mouth once daily 90 tablet 0   metoprolol succinate (TOPROL-XL) 25 MG 24 hr tablet TAKE 1 TABLET(25 MG) BY MOUTH DAILY 90 tablet 3   montelukast (SINGULAIR) 10 MG tablet TAKE 1 TABLET(10 MG) BY MOUTH AT BEDTIME 90 tablet 0   Potassium Chloride ER 20 MEQ TBCR      potassium chloride SA (K-DUR) 20 MEQ tablet take 1 tablet by mouth once daily     rosuvastatin (CRESTOR) 40 MG tablet TAKE 1 TABLET(40 MG) BY MOUTH DAILY 90 tablet 3   topiramate (TOPAMAX) 50 MG tablet TAKE 1/2 TABLET BY MOUTH DAILY THEN IN 1 WEEK INCREASE TO 1 DAILY     triamcinolone cream (KENALOG) 0.1 % Apply 1 application topically daily. 80 g 2   LORazepam (ATIVAN) 1 MG tablet Take 1 tablet 30 minutes prior to leaving house on day of office surgery.  Bring second tablet with you to office in day of office surgery. (Patient not taking: Reported on 08/31/2020) 2 tablet 0   Current Facility-Administered Medications  Medication Dose Route Frequency  Provider Last Rate Last Admin   Benralizumab SOSY 30 mg  30 mg Subcutaneous Q8 Weeks Kozlow, Donnamarie Poag, MD   30 mg at 07/13/20 1445   REVIEW OF SYSTEMS: Valu.Nieves ] denotes positive finding; [  ] denotes negative finding  CARDIOVASCULAR:  '[ ]'$  chest pain   '[ ]'$  dyspnea on exertion  '[ ]'$  leg swelling  CONSTITUTIONAL:  '[ ]'$  fever   '[ ]'$  chills  PHYSICAL EXAM: Vitals:   08/31/20 1031  BP: (!) 170/84  Pulse: 62  Resp: 16  Temp: 97.6 F (36.4 C)  TempSrc: Temporal  SpO2: 99%  Weight: 190 lb (86.2 kg)  Height: '5\' 7"'$  (1.702 m)   GENERAL: The patient is a well-nourished female, in no acute distress. The vital signs are documented above. CARDIOVASCULAR: There is a regular rate and rhythm. PULMONARY: There is good air exchange bilaterally  without wheezing or rales. VASCULAR: She has no significant bruising in the left leg.  She has mild left leg swelling.  DATA:  VENOUS DUPLEX: I have independently interpreted her venous duplex scan today.  There is no evidence of DVT.  The left great saphenous vein is successfully closed  MEDICAL ISSUES:  S/P LASER ABLATION LEFT GREAT SAPHENOUS VEIN: Patient is doing well status post laser ablation of the left great saphenous vein.  She has 1 more week with her thigh-high stockings.  Her duplex scan looks good with no DVT and successful ablation of the vein.  She will continue with leg elevation and conservative measures including exercise and avoiding prolonged sitting and standing.  We have no plans for the right leg.  I will see her back as needed.  Deitra Mayo [facility] [place] [phone]

## 2020-09-07 ENCOUNTER — Ambulatory Visit (INDEPENDENT_AMBULATORY_CARE_PROVIDER_SITE_OTHER): Payer: Medicare HMO

## 2020-09-07 ENCOUNTER — Other Ambulatory Visit: Payer: Self-pay

## 2020-09-07 DIAGNOSIS — J4550 Severe persistent asthma, uncomplicated: Secondary | ICD-10-CM

## 2020-09-10 ENCOUNTER — Observation Stay (HOSPITAL_COMMUNITY)
Admission: EM | Admit: 2020-09-10 | Discharge: 2020-09-12 | Disposition: A | Discharge status: Home / Self Care | Payer: Medicare HMO | Attending: Internal Medicine | Admitting: Internal Medicine

## 2020-09-10 ENCOUNTER — Ambulatory Visit: Admission: EM | Admit: 2020-09-10 | Discharge: 2020-09-10 | Payer: Medicare HMO

## 2020-09-10 ENCOUNTER — Encounter (HOSPITAL_COMMUNITY): Payer: Self-pay | Admitting: Emergency Medicine

## 2020-09-10 ENCOUNTER — Other Ambulatory Visit: Payer: Self-pay

## 2020-09-10 ENCOUNTER — Emergency Department (HOSPITAL_COMMUNITY): Payer: Medicare HMO

## 2020-09-10 DIAGNOSIS — Z79899 Other long term (current) drug therapy: Secondary | ICD-10-CM | POA: Insufficient documentation

## 2020-09-10 DIAGNOSIS — Z20822 Contact with and (suspected) exposure to covid-19: Secondary | ICD-10-CM | POA: Diagnosis not present

## 2020-09-10 DIAGNOSIS — M316 Other giant cell arteritis: Secondary | ICD-10-CM

## 2020-09-10 DIAGNOSIS — I110 Hypertensive heart disease with heart failure: Secondary | ICD-10-CM | POA: Insufficient documentation

## 2020-09-10 DIAGNOSIS — J45909 Unspecified asthma, uncomplicated: Secondary | ICD-10-CM | POA: Diagnosis not present

## 2020-09-10 DIAGNOSIS — I7789 Other specified disorders of arteries and arterioles: Secondary | ICD-10-CM | POA: Diagnosis not present

## 2020-09-10 DIAGNOSIS — H538 Other visual disturbances: Secondary | ICD-10-CM

## 2020-09-10 DIAGNOSIS — Z87891 Personal history of nicotine dependence: Secondary | ICD-10-CM | POA: Insufficient documentation

## 2020-09-10 DIAGNOSIS — I5031 Acute diastolic (congestive) heart failure: Secondary | ICD-10-CM | POA: Insufficient documentation

## 2020-09-10 DIAGNOSIS — I10 Essential (primary) hypertension: Secondary | ICD-10-CM

## 2020-09-10 DIAGNOSIS — E119 Type 2 diabetes mellitus without complications: Secondary | ICD-10-CM | POA: Diagnosis not present

## 2020-09-10 DIAGNOSIS — E039 Hypothyroidism, unspecified: Secondary | ICD-10-CM | POA: Diagnosis not present

## 2020-09-10 DIAGNOSIS — R519 Headache, unspecified: Principal | ICD-10-CM | POA: Diagnosis present

## 2020-09-10 LAB — COMPREHENSIVE METABOLIC PANEL
ALT: 21 U/L (ref 0–44)
AST: 23 U/L (ref 15–41)
Albumin: 3.9 g/dL (ref 3.5–5.0)
Alkaline Phosphatase: 57 U/L (ref 38–126)
Anion gap: 7 (ref 5–15)
BUN: 6 mg/dL — ABNORMAL LOW (ref 8–23)
CO2: 25 mmol/L (ref 22–32)
Calcium: 9.5 mg/dL (ref 8.9–10.3)
Chloride: 107 mmol/L (ref 98–111)
Creatinine, Ser: 0.88 mg/dL (ref 0.44–1.00)
GFR, Estimated: >60 mL/min (ref >60)
Glucose, Bld: 132 mg/dL — ABNORMAL HIGH (ref 70–99)
Potassium: 3.7 mmol/L (ref 3.5–5.1)
Sodium: 139 mmol/L (ref 135–145)
Total Bilirubin: 0.5 mg/dL (ref 0.3–1.2)
Total Protein: 7.7 g/dL (ref 6.5–8.1)

## 2020-09-10 LAB — RAPID URINE DRUG SCREEN, HOSP PERFORMED
Amphetamines: NOT DETECTED
Barbiturates: NOT DETECTED
Benzodiazepines: NOT DETECTED
Cocaine: NOT DETECTED
Opiates: NOT DETECTED
Tetrahydrocannabinol: NOT DETECTED

## 2020-09-10 LAB — CBC
HCT: 40.3 % (ref 36.0–46.0)
Hemoglobin: 13.6 g/dL (ref 12.0–15.0)
MCH: 27.6 pg (ref 26.0–34.0)
MCHC: 33.7 g/dL (ref 30.0–36.0)
MCV: 81.9 fL (ref 80.0–100.0)
Platelets: 245 10*3/uL (ref 150–400)
RBC: 4.92 MIL/uL (ref 3.87–5.11)
RDW: 14.4 % (ref 11.5–15.5)
WBC: 5.7 10*3/uL (ref 4.0–10.5)
nRBC: 0 % (ref 0.0–0.2)

## 2020-09-10 LAB — PROTIME-INR
INR: 1 (ref 0.8–1.2)
Prothrombin Time: 13 seconds (ref 11.4–15.2)

## 2020-09-10 LAB — URINALYSIS, ROUTINE W REFLEX MICROSCOPIC
Bilirubin Urine: NEGATIVE
Glucose, UA: NEGATIVE mg/dL
Hgb urine dipstick: NEGATIVE
Ketones, ur: NEGATIVE mg/dL
Leukocytes,Ua: NEGATIVE
Nitrite: NEGATIVE
Protein, ur: NEGATIVE mg/dL
Specific Gravity, Urine: 1.004 — ABNORMAL LOW (ref 1.005–1.030)
pH: 7 (ref 5.0–8.0)

## 2020-09-10 LAB — I-STAT CHEM 8, ED
BUN: 6 mg/dL — ABNORMAL LOW (ref 8–23)
Calcium, Ion: 1.2 mmol/L (ref 1.15–1.40)
Chloride: 105 mmol/L (ref 98–111)
Creatinine, Ser: 0.8 mg/dL (ref 0.44–1.00)
Glucose, Bld: 132 mg/dL — ABNORMAL HIGH (ref 70–99)
HCT: 41 % (ref 36.0–46.0)
Hemoglobin: 13.9 g/dL (ref 12.0–15.0)
Potassium: 3.7 mmol/L (ref 3.5–5.1)
Sodium: 142 mmol/L (ref 135–145)
TCO2: 26 mmol/L (ref 22–32)

## 2020-09-10 LAB — DIFFERENTIAL
Abs Immature Granulocytes: 0.02 10*3/uL (ref 0.00–0.07)
Basophils Absolute: 0 10*3/uL (ref 0.0–0.1)
Basophils Relative: 0 %
Eosinophils Absolute: 0 10*3/uL (ref 0.0–0.5)
Eosinophils Relative: 0 %
Immature Granulocytes: 0 %
Lymphocytes Relative: 31 %
Lymphs Abs: 1.8 10*3/uL (ref 0.7–4.0)
Monocytes Absolute: 0.6 10*3/uL (ref 0.1–1.0)
Monocytes Relative: 10 %
Neutro Abs: 3.4 10*3/uL (ref 1.7–7.7)
Neutrophils Relative %: 59 %

## 2020-09-10 LAB — C-REACTIVE PROTEIN: CRP: <0.5 mg/dL (ref <1.0)

## 2020-09-10 LAB — APTT: aPTT: 26 seconds (ref 24–36)

## 2020-09-10 LAB — SEDIMENTATION RATE: Sed Rate: 21 mm/hr (ref 0–22)

## 2020-09-10 MED ORDER — ACETAMINOPHEN 650 MG RE SUPP: 650.0000 mg | Freq: Four times a day (QID) | RECTAL | Status: DC | PRN

## 2020-09-10 MED ORDER — ACETAMINOPHEN 500 MG PO TABS
1000.0000 mg | ORAL_TABLET | Freq: Once | ORAL | Status: AC
  Administered 2020-09-10: 1000 mg via ORAL
  Filled 2020-09-10: qty 2

## 2020-09-10 MED ORDER — HYDRALAZINE HCL 20 MG/ML IJ SOLN
10.0000 mg | Freq: Once | INTRAMUSCULAR | Status: AC
  Administered 2020-09-10: 10 mg via INTRAVENOUS
  Filled 2020-09-10: qty 1

## 2020-09-10 MED ORDER — SODIUM CHLORIDE 0.9 % IV SOLN
INTRAVENOUS | Status: DC | PRN
  Administered 2020-09-10: 250 mL via INTRAVENOUS

## 2020-09-10 MED ORDER — LACTATED RINGERS IV BOLUS
500.0000 mL | Freq: Once | INTRAVENOUS | Status: AC
  Administered 2020-09-10: 500 mL via INTRAVENOUS

## 2020-09-10 MED ORDER — SODIUM CHLORIDE 0.9% FLUSH
3.0000 mL | Freq: Once | INTRAVENOUS | Status: DC
Start: 2020-09-10 — End: 2020-09-12

## 2020-09-10 MED ORDER — ENOXAPARIN SODIUM 40 MG/0.4ML IJ SOSY: 40.0000 mg | PREFILLED_SYRINGE | INTRAMUSCULAR | Status: DC

## 2020-09-10 MED ORDER — IOHEXOL 350 MG/ML SOLN
100.0000 mL | Freq: Once | INTRAVENOUS | Status: AC | PRN
  Administered 2020-09-10: 100 mL via INTRAVENOUS

## 2020-09-10 MED ORDER — METOCLOPRAMIDE HCL 5 MG/ML IJ SOLN
10.0000 mg | Freq: Once | INTRAMUSCULAR | Status: AC
  Administered 2020-09-10: 10 mg via INTRAVENOUS
  Filled 2020-09-10: qty 2

## 2020-09-10 MED ORDER — LORAZEPAM 2 MG/ML IJ SOLN
0.5000 mg | Freq: Once | INTRAMUSCULAR | Status: AC | PRN
  Administered 2020-09-11: 0.5 mg via INTRAVENOUS

## 2020-09-10 MED ORDER — DIPHENHYDRAMINE HCL 50 MG/ML IJ SOLN
12.5000 mg | Freq: Once | INTRAMUSCULAR | Status: AC
  Administered 2020-09-10: 12.5 mg via INTRAVENOUS
  Filled 2020-09-10: qty 1

## 2020-09-10 MED ORDER — SODIUM CHLORIDE 0.9 % IV SOLN
1000.0000 mg | Freq: Every day | INTRAVENOUS | Status: DC
  Administered 2020-09-10 – 2020-09-12 (×3): 1000 mg via INTRAVENOUS
  Filled 2020-09-10 (×3): qty 8

## 2020-09-10 MED ORDER — ACETAMINOPHEN 325 MG PO TABS
650.0000 mg | ORAL_TABLET | Freq: Four times a day (QID) | ORAL | Status: DC | PRN
  Administered 2020-09-11: 650 mg via ORAL
  Filled 2020-09-10: qty 2

## 2020-09-10 NOTE — ED Triage Notes (Signed)
Pt sent from [facility].  States she woke up with dizziness when she rolled over in bed this morning.  LKW last night.  Reports severe headache and neck pain (worst headache of life).  History of migraines but states this is worse.  No arm drift.  Pt hypertensive.  Dose change on BP medication 6 weeks ago.

## 2020-09-10 NOTE — ED Provider Notes (Addendum)
[facility] Provider Note   CSN: [id number] Arrival date & time: 09/10/20  1258     History No chief complaint on file.   Andrea Lowery is a 80 y.o. female.   Headache Pain location:  L temporal Quality:  Unable to specify Radiates to:  Face Severity currently:  6/10 Severity at highest:  10/10 Onset quality:  Sudden Duration:  1 day Timing:  Constant Chronicity:  New Similar to prior headaches: no   Context: bright light   Relieved by:  Nothing Worsened by:  Light Associated symptoms: blurred vision, facial pain, nausea, neck pain, neck stiffness, photophobia and visual change   Associated symptoms: no abdominal pain, no back pain, no diarrhea, no fever, no focal weakness, no seizures, no syncope and no vomiting    80 year old female with a history of DM2, HLD, HTN, hypothyroidism, MGUS, migraine headaches presenting to the emergency department with lightheadedness, headache.  She has had 1 day of symptoms.  She describes a left-sided temporal headache that radiates to her left face.  She endorses jaw claudication and fatigue.  She has had recent weight loss.  She endorses blurry vision in the left eye.  She endorses left-sided neck pain and stiffness she had similar symptoms and a similar presentation back in 2016 during which time she underwent temporal arterial biopsy which resulted negative for temporal arteritis.  She denies any specific numbness, weakness, facial droop.  She denies any vision loss.  She was initially evaluated in urgent care and sent to the emergency department at [facility] for stroke work-up.  In urgent care, she was found to be hypertensive, BP 192/100, afebrile, otherwise stable.  On further history taking, the patient endorses jaw claudication & recent weight loss.  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   Protrusion of lumbar intervertebral disc 09/13/2019   Seasonal and perennial allergic rhinitis 08/18/2019   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   No-show for appointment 07/07/2019   Elevated serum creatinine 06/05/2018   Hyperglycemia 06/05/2018   Arthralgia 08/02/2016   Eczema 08/02/2016   Hypokalemia 04/30/2016   Impingement syndrome of left shoulder 04/05/2016   Carotid artery disease ([place]) 01/19/2016   Dyslipidemia 01/19/2016   Acute severe exacerbation of asthma 01/10/2016   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, bilateral 11/20/2015   Noncompliance 11/20/2015   Seborrheic dermatitis of scalp 11/20/2015   Edema of extremities 11/20/2015   History of lumbar laminectomy for spinal cord decompression 07/31/2015   Candidal skin infection 06/14/2015   Osteoporosis without current pathological fracture 05/30/2015   Chronic pain of right hip 05/02/2015   Chronic right-sided low back pain with right-sided sciatica 03/31/2015   Temporal arteritis ([place]) 11/07/2014   Headache 11/07/2014   Diabetes mellitus type 2, controlled ([place]) 11/07/2014  Diastolic CHF ([place]) 88/32/5498   Sacroiliac dysfunction 11/04/2014   Trochanteric bursitis of right hip 11/04/2014   Asthma 10/05/2014   GERD (gastroesophageal reflux disease) 10/05/2014   Allergic rhinitis 10/05/2014   Hypothyroidism 09/21/2014   Multinodular goiter 08/09/2014   Disorder of sacroiliac joint 07/29/2014   Spondylosis of lumbar region without  myelopathy or radiculopathy 07/29/2014   Thyroid nodule 07/01/2014   Hypersomnolence 04/16/2014   Snoring 04/16/2014   Anxiety 03/18/2014   Preventive measure 01/25/2014   Difficulty sleeping 12/28/2013   Hyperlipidemia 26/41/5830   Acute diastolic CHF (congestive heart failure), NYHA class 2 ([place]) 07/29/2013   Bilateral leg edema 07/20/2013   Edema 07/20/2013   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/2013   Monoclonal paraproteinemia 05/20/2013   Essential hypertension 07/01/2012   LBBB (left bundle branch block) 07/01/2012   DOE (dyspnea on exertion) 07/01/2012   OSA on CPAP 07/01/2012   Abnormal respiratory rate 07/01/2012    Past Surgical History:  Procedure Laterality Date   ABDOMINAL HYSTERECTOMY  1969   ARTERY BIOPSY N/A 11/07/2014   Procedure: BIOPSY TEMPORAL ARTERY;  Surgeon: Leta Baptist, MD;  Location: [facility];  Service: ENT;  Laterality: N/A;   CARPAL TUNNEL RELEASE Right    CHOLECYSTECTOMY  1976   ENDOVENOUS ABLATION SAPHENOUS VEIN W/ LASER Left 08/24/2020   endovenous laser ablation left greater saphenous vein by Gae Gallop MD   LUMBAR LAMINECTOMY/DECOMPRESSION MICRODISCECTOMY N/A 07/31/2015   Procedure: L4-5 Decompression, Possible Right L4-5 Microdiscectomy;  Surgeon: Marybelle Killings, MD;  Location: [facility];  Service: Orthopedics;  Laterality: N/A;   NM MYOVIEW LTD  02/13/2010   No ischemia   PLANTAR FASCIA RELEASE     TONSILLECTOMY  1965   US ECHOCARDIOGRAPHY  09/20/2008   borderline LVH,mild TR,AOV mildly sclerotic w/ca+ of the leaflets     OB History   No obstetric history on file.     Family History  Problem Relation Age of Onset   Diabetes Father    Heart failure Father    Hypertension Father    Thyroid disease Sister    Hypertension Sister     Social History   Tobacco Use   Smoking status: Former    Types: Cigarettes    Quit date: 02/04/2006    Years since quitting: 14.6   Smokeless tobacco: Never  Vaping Use   Vaping Use:  Never used  Substance Use Topics   Alcohol use: No    Alcohol/week: 0.0 standard drinks   Drug use: No    Home Medications Prior to Admission medications   Medication Sig Start Date End Date Taking? Authorizing Provider  albuterol (VENTOLIN HFA) 108 (90 Base) MCG/ACT inhaler INHALE 2 PUFFS INTO THE LUNGS EVERY 4 HOURS AS NEEDED FOR WHEEZING OR SHORTNESS OF BREATH Patient taking differently: Inhale 2 puffs into the lungs every 4 (four) hours as needed for wheezing. INHALE 2 PUFFS INTO THE LUNGS EVERY 4 HOURS AS NEEDED FOR WHEEZING OR SHORTNESS OF BREATH 04/20/20  Yes Kozlow, Donnamarie Poag, MD  Alcohol Swabs (B-D SINGLE USE SWABS REGULAR) PADS  01/04/20  Yes [provider]  alendronate (FOSAMAX) 70 MG tablet Take 70 mg by mouth every Wednesday.  05/30/15  Yes [provider]  budesonide-formoterol (SYMBICORT) 160-4.5 MCG/ACT inhaler INHALE 2 PUFFS BY MOUTH INTO THE LUNGS TWICE DAILY RINSE GARGLE AND SPIT AFTER USE 08/31/20  Yes Kozlow, Donnamarie Poag, MD  cetirizine (ZYRTEC) 10 MG tablet Take 1 tablet 1-2 times a day for allergies. 02/08/20  Yes Kozlow, Donnamarie Poag, MD  esomeprazole (NEXIUM) 40 MG capsule Take 1 capsule (40 mg total) by mouth 2 (two) times daily. 11/10/18  Yes Kozlow, Donnamarie Poag, MD  FASENRA 30 MG/ML SOSY INJECT 30MG SUBCUTANEOUSLY EVERY 8 WEEKS 08/21/20  Yes Kozlow, Donnamarie Poag, MD  fluticasone (FLONASE) 50 MCG/ACT nasal spray SHAKE LIQUID AND USE 2 SPRAYS IN EACH NOSTRIL DAILY Patient taking differently: Place 2 sprays into both nostrils daily. SHAKE LIQUID AND USE 2 SPRAYS IN EACH NOSTRIL DAILY 05/26/20  Yes Kozlow, Donnamarie Poag, MD  hydrOXYzine (ATARAX/VISTARIL) 10 MG tablet 10 mg 3 (three) times daily as needed for itching. 11/29/19  Yes [provider]  ipratropium-albuterol (DUONEB) 0.5-2.5 (3) MG/3ML SOLN Take 3 mLs by nebulization every 6 (six) hours as needed. 05/23/20  Yes Scot Jun, FNP  ketoconazole (NIZORAL) 2 % cream  11/29/19  Yes [provider]  levothyroxine  (SYNTHROID, LEVOTHROID) 50 MCG tablet take 1 tablet by mouth once daily 08/04/15  Yes Renato Shin, MD  metoprolol succinate (TOPROL-XL) 25 MG 24 hr tablet TAKE 1 TABLET(25 MG) BY MOUTH DAILY Patient taking differently: Take 25 mg by mouth daily. 11/29/19  Yes Hilty, Nadean Corwin, MD  montelukast (SINGULAIR) 10 MG tablet TAKE 1 TABLET(10 MG) BY MOUTH AT BEDTIME Patient taking differently: Take 10 mg by mouth at bedtime. TAKE 1 TABLET(10 MG) BY MOUTH AT BEDTIME 06/28/20  Yes Kozlow, Donnamarie Poag, MD  potassium chloride SA (K-DUR) 20 MEQ tablet Take 20 mEq by mouth daily. 03/10/17  Yes [provider]  rosuvastatin (CRESTOR) 40 MG tablet TAKE 1 TABLET(40 MG) BY MOUTH DAILY Patient taking differently: Take 40 mg by mouth daily. 12/06/19  Yes Hilty, Nadean Corwin, MD  topiramate (TOPAMAX) 50 MG tablet Take 50 mg by mouth daily. 07/01/18  Yes [provider]  triamcinolone cream (KENALOG) 0.1 % Apply 1 application topically daily. 08/30/20  Yes Lavonna Monarch, MD  Vitamin D, Ergocalciferol, (DRISDOL) 1.25 MG (50000 UNIT) CAPS capsule Take 50,000 Units by mouth every 7 (seven) days. Pt takes on Wednesday of each week   Yes [provider]  [place] test strip  12/01/19   [provider]  Accu-Chek Softclix Lancets lancets  12/01/19   [provider]  chlorthalidone (HYGROTON) 25 MG tablet  12/14/19   [provider]  cycloSPORINE (RESTASIS) 0.05 % ophthalmic emulsion Place one drop into both eyes 2 (two) times daily. Patient not taking: Reported on 09/10/2020 11/20/15   [provider]  EPINEPHrine 0.3 mg/0.3 mL IJ SOAJ injection Use as directed for severe allergic reaction 08/18/19   Garnet Sierras, DO  JARDIANCE 25 MG TABS tablet  12/27/19   [provider]  LORazepam (ATIVAN) 1 MG tablet Take 1 tablet 30 minutes prior to leaving house on day of office surgery.  Bring second tablet with you to office in day of office surgery. Patient not  taking: No sig reported 08/15/20   Angelia Mould, MD  Potassium Chloride ER 20 MEQ TBCR  07/01/18   [provider]    Allergies    Bee venom and Penicillins  Review of Systems   Review of Systems  Constitutional:  Negative for fever.  Eyes:  Positive for blurred vision and photophobia.  Cardiovascular:  Negative for syncope.  Gastrointestinal:  Positive for nausea. Negative for abdominal pain, diarrhea and vomiting.  Musculoskeletal:  Positive for neck pain and neck stiffness. Negative for back pain.  Neurological:  Positive for headaches. Negative for  focal weakness and seizures.  All other systems reviewed and are negative.  Physical Exam Updated Vital Signs BP (!) 136/108   Pulse 64   Temp 98.6 F (37 C)   Resp (!) 21   SpO2 100%   Physical Exam Vitals and nursing note reviewed.  Constitutional:      General: She is not in acute distress.    Appearance: She is well-developed.  HENT:     Head: Normocephalic and atraumatic.     Comments: Temporal arterial pulses palpated, tenderness to palpation of the left temporal artery Eyes:     General: Vision grossly intact. No visual field deficit.    Extraocular Movements: Extraocular movements intact.     Conjunctiva/sclera: Conjunctivae normal.  Neck:     Comments: Negative meningeal signs, intact range of motion, mild left-sided tenderness to palpation of the neck Cardiovascular:     Rate and Rhythm: Normal rate and regular rhythm.     Heart sounds: No murmur heard. Pulmonary:     Effort: Pulmonary effort is normal. No respiratory distress.     Breath sounds: Normal breath sounds.  Abdominal:     Palpations: Abdomen is soft.     Tenderness: There is no abdominal tenderness.  Musculoskeletal:     Cervical back: Neck supple.  Skin:    General: Skin is warm and dry.  Neurological:     General: No focal deficit present.     Mental Status: She is alert and oriented to person, place, and time. Mental  status is at baseline.     Cranial Nerves: No cranial nerve deficit.     Sensory: No sensory deficit.     Motor: No weakness.     Coordination: Coordination normal.    ED Results / Procedures / Treatments   Labs (all labs ordered are listed, but only abnormal results are displayed) Labs Reviewed  COMPREHENSIVE METABOLIC PANEL - Abnormal; Notable for the following components:      Result Value   Glucose, Bld 132 (*)    BUN 6 (*)    All other components within normal limits  URINALYSIS, ROUTINE W REFLEX MICROSCOPIC - Abnormal; Notable for the following components:   Color, Urine STRAW (*)    Specific Gravity, Urine 1.004 (*)    All other components within normal limits  I-STAT CHEM 8, ED - Abnormal; Notable for the following components:   BUN 6 (*)    Glucose, Bld 132 (*)    All other components within normal limits  SARS CORONAVIRUS 2 (TAT 6-24 HRS)  PROTIME-INR  APTT  CBC  DIFFERENTIAL  RAPID URINE DRUG SCREEN, HOSP PERFORMED  SEDIMENTATION RATE  C-REACTIVE PROTEIN  CBG MONITORING, ED    EKG EKG Interpretation  Date/Time:  Sunday September 10 2020 12:58:36 EDT Ventricular Rate:  66 PR Interval:  182 QRS Duration: 138 QT Interval:  452 QTC Calculation: 473 R Axis:   97 Text Interpretation: Normal sinus rhythm Rightward axis Non-specific intra-ventricular conduction block Cannot rule out Septal infarct , age undetermined Abnormal ECG No significant change since last tracing Confirmed by Regan Lemming ([id number]) on 09/10/2020 8:41:32 PM  Radiology CT Angio Head W or Wo Contrast  Result Date: 09/10/2020 CLINICAL DATA:  Dizziness EXAM: CT ANGIOGRAPHY HEAD AND NECK TECHNIQUE: Multidetector CT imaging of the head and neck was performed using the standard protocol during bolus administration of intravenous contrast. Multiplanar CT image reconstructions and MIPs were obtained to evaluate the vascular anatomy. Carotid stenosis measurements (when applicable) are  obtained utilizing NASCET  criteria, using the distal internal carotid diameter as the denominator. CONTRAST:  179m OMNIPAQUE IOHEXOL 350 MG/ML SOLN COMPARISON:  02/02/2019 FINDINGS: CTA NECK FINDINGS SKELETON: There is no bony spinal canal stenosis. No lytic or blastic lesion. OTHER NECK: Normal pharynx, larynx and major salivary glands. No cervical lymphadenopathy. Incidentally noted and unchanged 12 mm left thyroid nodule. No follow-up necessary. UPPER CHEST: No pneumothorax or pleural effusion. No nodules or masses. AORTIC ARCH: There is calcific atherosclerosis of the aortic arch. There is no aneurysm, dissection or hemodynamically significant stenosis of the visualized portion of the aorta. Conventional 3 vessel aortic branching pattern. The visualized proximal subclavian arteries are widely patent. RIGHT CAROTID SYSTEM: No dissection, occlusion or aneurysm. There is calcified atherosclerosis extending into the proximal ICA, resulting in less than 50% stenosis. LEFT CAROTID SYSTEM: No dissection, occlusion or aneurysm. Mild atherosclerotic calcification at the carotid bifurcation without hemodynamically significant stenosis. VERTEBRAL ARTERIES: Codominant configuration. Both origins are clearly patent. There is no dissection, occlusion or flow-limiting stenosis to the skull base (V1-V3 segments). CTA HEAD FINDINGS POSTERIOR CIRCULATION: --Vertebral arteries: Normal V4 segments. --Inferior cerebellar arteries: Normal. --Basilar artery: Normal. --Superior cerebellar arteries: Normal. --Posterior cerebral arteries (PCA): Normal. ANTERIOR CIRCULATION: --Intracranial internal carotid arteries: Normal. --Anterior cerebral arteries (ACA): Normal. Both A1 segments are present. Patent anterior communicating artery (a-comm). --Middle cerebral arteries (MCA): Normal. VENOUS SINUSES: As permitted by contrast timing, patent. ANATOMIC VARIANTS: None Review of the MIP images confirms the above findings. IMPRESSION: 1. No emergent large vessel  occlusion or high-grade stenosis of the intracranial or cervical arteries. 2. Bilateral carotid bifurcation atherosclerosis without hemodynamically significant stenosis by NASCET criteria. Aortic Atherosclerosis (ICD10-I70.0). Electronically Signed   By: KUlyses JarredM.D.   On: 09/10/2020 19:21   CT HEAD WO CONTRAST  Result Date: 09/10/2020 CLINICAL DATA:  Woke up this morning with dizziness and headache. EXAM: CT HEAD WITHOUT CONTRAST TECHNIQUE: Contiguous axial images were obtained from the base of the skull through the vertex without intravenous contrast. COMPARISON:  Head CT 11/06/2014 FINDINGS: Brain: Stable age related cerebral atrophy, ventriculomegaly and periventricular white matter disease. No extra-axial fluid collections are identified. No CT findings for acute hemispheric infarction or intracranial hemorrhage. No mass lesions. The brainstem and cerebellum are normal. Vascular: Stable vascular calcifications. No aneurysm hyperdense vessels. Skull: No skull fracture or bone lesions. Sinuses/Orbits: The paranasal sinuses and mastoid air cells are clear. The globes are intact. Other: No scalp lesions or scalp hematoma. IMPRESSION: 1. Stable age related cerebral atrophy, ventriculomegaly and periventricular white matter disease. 2. No acute intracranial findings or mass lesions. Electronically Signed   By: PMarijo SanesM.D.   On: 09/10/2020 13:49   CT Angio Neck W and/or Wo Contrast  Result Date: 09/10/2020 CLINICAL DATA:  Dizziness EXAM: CT ANGIOGRAPHY HEAD AND NECK TECHNIQUE: Multidetector CT imaging of the head and neck was performed using the standard protocol during bolus administration of intravenous contrast. Multiplanar CT image reconstructions and MIPs were obtained to evaluate the vascular anatomy. Carotid stenosis measurements (when applicable) are obtained utilizing NASCET criteria, using the distal internal carotid diameter as the denominator. CONTRAST:  1031mOMNIPAQUE IOHEXOL 350 MG/ML  SOLN COMPARISON:  02/02/2019 FINDINGS: CTA NECK FINDINGS SKELETON: There is no bony spinal canal stenosis. No lytic or blastic lesion. OTHER NECK: Normal pharynx, larynx and major salivary glands. No cervical lymphadenopathy. Incidentally noted and unchanged 12 mm left thyroid nodule. No follow-up necessary. UPPER CHEST: No pneumothorax or pleural effusion. No nodules or masses. AORTIC  ARCH: There is calcific atherosclerosis of the aortic arch. There is no aneurysm, dissection or hemodynamically significant stenosis of the visualized portion of the aorta. Conventional 3 vessel aortic branching pattern. The visualized proximal subclavian arteries are widely patent. RIGHT CAROTID SYSTEM: No dissection, occlusion or aneurysm. There is calcified atherosclerosis extending into the proximal ICA, resulting in less than 50% stenosis. LEFT CAROTID SYSTEM: No dissection, occlusion or aneurysm. Mild atherosclerotic calcification at the carotid bifurcation without hemodynamically significant stenosis. VERTEBRAL ARTERIES: Codominant configuration. Both origins are clearly patent. There is no dissection, occlusion or flow-limiting stenosis to the skull base (V1-V3 segments). CTA HEAD FINDINGS POSTERIOR CIRCULATION: --Vertebral arteries: Normal V4 segments. --Inferior cerebellar arteries: Normal. --Basilar artery: Normal. --Superior cerebellar arteries: Normal. --Posterior cerebral arteries (PCA): Normal. ANTERIOR CIRCULATION: --Intracranial internal carotid arteries: Normal. --Anterior cerebral arteries (ACA): Normal. Both A1 segments are present. Patent anterior communicating artery (a-comm). --Middle cerebral arteries (MCA): Normal. VENOUS SINUSES: As permitted by contrast timing, patent. ANATOMIC VARIANTS: None Review of the MIP images confirms the above findings. IMPRESSION: 1. No emergent large vessel occlusion or high-grade stenosis of the intracranial or cervical arteries. 2. Bilateral carotid bifurcation atherosclerosis  without hemodynamically significant stenosis by NASCET criteria. Aortic Atherosclerosis (ICD10-I70.0). Electronically Signed   By: Ulyses Jarred M.D.   On: 09/10/2020 19:21    Procedures Procedures   Medications Ordered in ED Medications  sodium chloride flush (NS) 0.9 % injection 3 mL (has no administration in time range)  methylPREDNISolone sodium succinate (SOLU-MEDROL) 1,000 mg in sodium chloride 0.9 % 50 mL IVPB (has no administration in time range)  hydrALAZINE (APRESOLINE) injection 10 mg (10 mg Intravenous Given 09/10/20 1629)  metoCLOPramide (REGLAN) injection 10 mg (10 mg Intravenous Given 09/10/20 1632)  diphenhydrAMINE (BENADRYL) injection 12.5 mg (12.5 mg Intravenous Given 09/10/20 1633)  lactated ringers bolus 500 mL (0 mLs Intravenous Stopped 09/10/20 1731)  acetaminophen (TYLENOL) tablet 1,000 mg (1,000 mg Oral Given 09/10/20 1637)  iohexol (OMNIPAQUE) 350 MG/ML injection 100 mL (100 mLs Intravenous Contrast Given 09/10/20 1909)    ED Course  I have reviewed the triage vital signs and the nursing notes.  Pertinent labs & imaging results that were available during my care of the patient were reviewed by me and considered in my medical decision making (see chart for details).    MDM Rules/Calculators/A&P                           80 year old female with a history of DM2, HLD, HTN, hypothyroidism, MGUS, migraine headaches presenting to the emergency department with lightheadedness, headache.  She has had 1 day of symptoms.  She describes a left-sided temporal headache that radiates to her left face.  She endorses jaw claudication and fatigue.  She has had recent weight loss.  She endorses blurry vision in the left eye.  She endorses left-sided neck pain and stiffness she had similar symptoms and a similar presentation back in 2016 during which time she underwent temporal arterial biopsy which resulted negative for temporal arteritis.  She denies any specific numbness, weakness, facial  droop.  She denies any vision loss.  She was initially evaluated in urgent care and sent to the emergency department at [facility] for stroke work-up.  In urgent care, she was found to be hypertensive, BP 192/100, afebrile, otherwise stable.  On further history taking, the patient endorses jaw claudication & recent weight loss.  Vitals on arrival significant for hypertension, BP 217/93, afebrile, saturating well on  room air.  Physical exam significant for a normal neurologic exam, positive temporal arterial tenderness to palpation with a palpable temporal arterial pulse.  Blurry vision in the left eye persistent but vision notably intact on examination 20/40 in both eyes.  No amaurosis fugax.  No diplopia.  DDx: Migraine headache, hypertensive emergency, intracranial hemorrhage, giant cell arteritis, trigeminal neuralgia, space-occupying lesion, arterial dissection.  No fever or alteration in mental status to suggest bacterial meningitis or viral encephalitis.  The patient does meet 3 criteria concerning for GCA.  She her age is 17, she has a new onset headache that is left-sided with associated temporal arterial tenderness and visual deficit with jaw claudication and weight loss.  CTA imaging of the head and neck without evidence of dissection or intracranial bleed.  On reassessment, the patient's symptoms were improved following administration of a migraine cocktail and IV hydralazine.  She still endorsed persistent blurry vision as well as pain along the left side of her temple along her temporal artery.  ESR and CRP resulted negative. CBC without   I spoke with the on-call ophthalmologist, Dr. Posey Pronto who stated that the patient's clinical findings were concerning for possible GCA.  Recommended high-dose steroids and neurology evaluation for stroke rule out.  Ophthalmology plans to evaluate the patient in the morning.  Patient was administered 1000 mg of IV Solu-Medrol.  Medicine was consulted for admission.  Signout given to [place] at 2200.   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

## 2020-09-10 NOTE — H&P (Addendum)
Date: 09/10/2020               Patient Name:  Andrea Lowery MRN: [id number]  DOB: [birth year] Age / Sex: 80 y.o., female   PCP: Hayden Rasmussen, MD         Medical Service: Internal Medicine Teaching Service         Attending Physician: Dr. Sid Falcon, MD    First Contact: Dr. Jeanice Lim Pager: [phone]  Second Contact: Dr. Collene Gobble Pager: [phone]       After Hours (After 5p/  First Contact Pager: [phone]  weekends / holidays): Second Contact Pager: [phone]   Chief Complaint: headache and dizziness  History of Present Illness: Andrea Lowery is a 80 y.o. female with past medical history of asthma, diabetes, HLD, migraine, HTN, hypothyroid, MGUS who presents with 2 days of headache and dizziness. She states that she noticed the pain yesterday morning mostly over her L temporal area. She took tylenol and topiramate, which she normally takes when she has a migraine, for relief however none was felt. She woke up this morning and went to church and when she still had the pain she made the decision to be evaluated. She states that she has also noticed blurry vision of her L eye, tenderness of the left side of her face, and L sided jaw pain. She denies weakness, chest pain, shortness of breath.  Of note she has been evaluated in the past for similar symptoms. In October 2016 she presented with these symptoms and there was concern for giant cell arteritis. She underwent temporal artery biopsy at the time which was negative for inflammation.  She is unable to remember exactly which home medications that she takes and will have her son bring an updated list for [place].  Meds:  Current Facility-Administered Medications for the 09/10/20 encounter [facility] Encounter)  Medication   Benralizumab SOSY 30 mg   Current Meds  Medication Sig   albuterol (VENTOLIN HFA) 108 (90 Base) MCG/ACT inhaler INHALE 2 PUFFS INTO THE LUNGS EVERY 4 HOURS AS NEEDED FOR WHEEZING OR SHORTNESS OF BREATH (Patient  taking differently: Inhale 2 puffs into the lungs every 4 (four) hours as needed for wheezing. INHALE 2 PUFFS INTO THE LUNGS EVERY 4 HOURS AS NEEDED FOR WHEEZING OR SHORTNESS OF BREATH)   Alcohol Swabs (B-D SINGLE USE SWABS REGULAR) PADS    alendronate (FOSAMAX) 70 MG tablet Take 70 mg by mouth every Wednesday.    budesonide-formoterol (SYMBICORT) 160-4.5 MCG/ACT inhaler INHALE 2 PUFFS BY MOUTH INTO THE LUNGS TWICE DAILY RINSE GARGLE AND SPIT AFTER USE   cetirizine (ZYRTEC) 10 MG tablet Take 1 tablet 1-2 times a day for allergies.   esomeprazole (NEXIUM) 40 MG capsule Take 1 capsule (40 mg total) by mouth 2 (two) times daily.   FASENRA 30 MG/ML SOSY INJECT '30MG'$  SUBCUTANEOUSLY EVERY 8 WEEKS   fluticasone (FLONASE) 50 MCG/ACT nasal spray SHAKE LIQUID AND USE 2 SPRAYS IN EACH NOSTRIL DAILY (Patient taking differently: Place 2 sprays into both nostrils daily. SHAKE LIQUID AND USE 2 SPRAYS IN EACH NOSTRIL DAILY)   hydrOXYzine (ATARAX/VISTARIL) 10 MG tablet 10 mg 3 (three) times daily as needed for itching.   ipratropium-albuterol (DUONEB) 0.5-2.5 (3) MG/3ML SOLN Take 3 mLs by nebulization every 6 (six) hours as needed.   ketoconazole (NIZORAL) 2 % cream    levothyroxine (SYNTHROID, LEVOTHROID) 50 MCG tablet take 1 tablet by mouth once daily   metoprolol succinate (TOPROL-XL) 25 MG 24 hr  tablet TAKE 1 TABLET(25 MG) BY MOUTH DAILY (Patient taking differently: Take 25 mg by mouth daily.)   montelukast (SINGULAIR) 10 MG tablet TAKE 1 TABLET(10 MG) BY MOUTH AT BEDTIME (Patient taking differently: Take 10 mg by mouth at bedtime. TAKE 1 TABLET(10 MG) BY MOUTH AT BEDTIME)   potassium chloride SA (K-DUR) 20 MEQ tablet Take 20 mEq by mouth daily.   rosuvastatin (CRESTOR) 40 MG tablet TAKE 1 TABLET(40 MG) BY MOUTH DAILY (Patient taking differently: Take 40 mg by mouth daily.)   topiramate (TOPAMAX) 50 MG tablet Take 50 mg by mouth daily.   triamcinolone cream (KENALOG) 0.1 % Apply 1 application topically daily.    Vitamin D, Ergocalciferol, (DRISDOL) 1.25 MG (50000 UNIT) CAPS capsule Take 50,000 Units by mouth every 7 (seven) days. Pt takes on Wednesday of each week     Allergies: Allergies as of 09/10/2020 - Review Complete 09/10/2020  Allergen Reaction Noted   Bee venom Anaphylaxis 07/29/2013   Penicillins Hives 01/01/2011   Past Medical History:  Diagnosis Date   Arthritis    Asthma    Diabetes mellitus without complication ([place])    Diabetes II   DVT (deep venous thrombosis) ([place])    "Years ago"   Dyslipidemia    Eczema    GERD (gastroesophageal reflux disease)    Headache    Migraine   Hypertension    Hypothyroidism    LBBB (left bundle branch block)    MGUS (monoclonal gammopathy of unknown significance)    MGUS (monoclonal gammopathy of unknown significance)    Migraine    OSA on CPAP     Family History:  Father: diabetes, heart failure, HTN Sister: thyroid disease, HTN, dementia  Social History: Ms. Raad lives in [place], [place] with her sister who has dementia. She has 4 great grandchildren.  Review of Systems: A complete ROS was negative except as per HPI.   Physical Exam: Blood pressure (!) 143/79, pulse (!) 54, temperature 98.6 F (37 C), resp. rate 15, SpO2 96 %. Physical Exam Constitutional:      General: She is not in acute distress.    Appearance: Normal appearance.  HENT:     Head: Normocephalic and atraumatic.  Eyes:     General:        Right eye: No discharge.        Left eye: No discharge.     Extraocular Movements: Extraocular movements intact.  Cardiovascular:     Rate and Rhythm: Normal rate and regular rhythm.     Heart sounds: Normal heart sounds.  Pulmonary:     Effort: Pulmonary effort is normal.     Breath sounds: Normal breath sounds.  Abdominal:     Palpations: Abdomen is soft.     Tenderness: There is no abdominal tenderness.  Skin:    General: Skin is warm and dry.  Neurological:     General: No focal deficit present.      Mental Status: She is alert and oriented to person, place, and time.     Comments: Tenderness to palpation over L side of face including forehead and jaw with increased tenderness over temporal area.   Psychiatric:        Attention and Perception: Attention and perception normal.        Mood and Affect: Mood and affect normal.        Speech: Speech normal.     EKG: personally reviewed my interpretation is sinus rhythm with noted intra-ventricular conduction block and  possible septal infarct of unknown age.  CTA head and neck: No emergent large vessel occlusion or high-grade stenosis of the intracranial or cervical arteries, bilateral carotid bifurcation atherosclerosis without hemodynamically significant stenosis by NASCET criteria. CT head: Stable age related cerebral atrophy, ventriculomegaly and periventricular white matter disease but no acute intracranial findings or mass lesions.  Assessment & Plan by Problem: #Left temporal headache Patient presents with L temporal headache, jaw pain, and generalized L facial tenderness with L blurry vision. On exam she states that her pain has improved since receiving tylenol, diphenhydramine in the ED. Patient does report a similar episode in 2016 but temporal artery biopsy at that time was unremarkable. She does meet several criteria for diagnosing giant cell arteritis including new headache or change in characteristics of preexisting headache, abrupt onset of visual disturbances, especially transient/permanent monocular visual loss, jaw claudication, temporal artery tenderness to palpation. CRP and sed rate were normal. - Ophthalmology consulted by ED physician and recommended high-dose steroids and neurology evaluation for stroke rule-out; they will see the patient in the morning.  - Pending neurology consult - CTA head/neck showed no emergent large vessel occlusion or high-grade stenosis of the intracranial or cervical arteries, bilateral carotid  bifurcation atherosclerosis without hemodynamically significant stenosis by NASCET criteria. - CT head showed stable age related cerebral atrophy, ventriculomegaly and periventricular white matter disease but no acute intracranial findings or mass lesions. - MRI showed no acute intracranial abnormality, findings of chronic small vessel ischemia and generalized volume loss. - Given 1000 mg methylprednisolone in ED - Tylenol 650 mg PRN pain   #Hypertension Patient with history of hypertension with BP max of 217/93 in ED today. She states that she is adherent to medications though she is unable to remember exactly which home medications that she takes and will have her son bring an updated list for [place]. Given continued hypertension it would be appropriate to restart her home medications once those are confirmed.  Dispo: Admit patient to Observation with expected length of stay less than 2 midnights.  SignedFarrel Gordon, DO 09/10/2020, 11:49 PM  Pager: [phone] After 5pm on weekdays and 1pm on weekends: On Call pager: [phone]

## 2020-09-10 NOTE — ED Triage Notes (Addendum)
Patient presents to Urgent Care with complaints of wheezing, headache, dizziness, and some discomfort in left eye since last night. Pt states she recently has been under a lot of stress. She has a hx of migraines. Pt states PCP aware of elevated BP and dose changed 6 weeks with no improvement.  Denies fever, chest pain, chest pain, numbness/tingling in extremities, or changes in speech or vision.

## 2020-09-10 NOTE — ED Notes (Signed)
ED Provider at bedside. 

## 2020-09-10 NOTE — ED Notes (Addendum)
Patient is being discharged from the Urgent Care and sent to the Emergency Department via POV . Per [place], [place] patient is in need of higher level of care to rule out stroke due to her dizziness, severe headache, and elevated BP. Patient is aware and verbalizes understanding of plan of care.  Vitals:   09/10/20 1155  BP: (S) (!) 192/100  Pulse: 73  Resp: 16  Temp: 97.9 F (36.6 C)  SpO2: 97%

## 2020-09-10 NOTE — ED Provider Notes (Signed)
Emergency Medicine Provider Triage Evaluation Note  Andrea Lowery , a 80 y.o. female  was evaluated in triage.  Pt complains of significant dizziness and lightheadedness since she woke up this morning.  Last known well was last night around 9 PM.  No history of stroke in the past, hypertension but not anticoagulated.   Review of Systems  Positive: Dizziness lightheadedness Negative: Chest pain or shortness of breath, palpitations, nausea or vomiting.  Physical Exam  BP (!) 217/93 (BP Location: Right Arm)   Pulse 65   Temp 99 F (37.2 C)   Resp 16   SpO2 100%  Gen:   Awake, no distress   Resp:  Normal effort  MSK:   Moves extremities without difficulty  Other:  PERRL, EOM's unable to be assessed due to patient's dizziness with examination.  Cranial nerves are intact, coordination is intact.  Very slight left upper extremity weakness relative to the right, 4/5 versus 5/5 on the right.  Unable to assess lower extremity strength due to recent vascular surgical procedure in the left leg with tenderness.  Perform 07/25/2020.  Medical Decision Making  Medically screening exam initiated at 1:14 PM.  Appropriate orders placed.  Andrea Lowery was informed that the remainder of the evaluation will be completed by another provider, this initial triage assessment does not replace that evaluation, and the importance of remaining in the ED until their evaluation is complete.  Given last known well over 12 hours ago, will not activate code stroke, patient does not meet VAN criteria.  However will proceed with stroke work-up.  This chart was dictated using voice recognition software, Dragon. Despite the best efforts of this provider to proofread and correct errors, errors may still occur which can change documentation meaning.    Emeline Darling, PA-C 09/10/20 1315    Regan Lemming, MD 09/10/20 [place]

## 2020-09-10 NOTE — ED Provider Notes (Signed)
21:55: Assumed care of patient from Dr. Kellie Simmering at change of shift pending consult for admission.  Please see prior provider note for full H&P and care details. Briefly patient is a 80 year old female who presented with findings concerning for giant cell arteritis. Hypertensive on arrival- improved with tx in the ED. Labs & imaging without significant abnormalities including normal inflammatory markers. Case was discussed with ophthalmology-recommended starting steroids for tx of GCA with concerning presentation & recommends stroke work-up, will see patient in AM, admit to medicine.   22:10: CONSULT: Discussed with internal medicine residency service- accept admission.    Leafy Kindle 09/10/20 2216    Regan Lemming, MD 09/11/20 1313

## 2020-09-11 ENCOUNTER — Observation Stay (HOSPITAL_COMMUNITY): Payer: Medicare HMO

## 2020-09-11 DIAGNOSIS — R519 Headache, unspecified: Secondary | ICD-10-CM | POA: Diagnosis not present

## 2020-09-11 DIAGNOSIS — I10 Essential (primary) hypertension: Secondary | ICD-10-CM | POA: Diagnosis not present

## 2020-09-11 LAB — COMPREHENSIVE METABOLIC PANEL
ALT: 21 U/L (ref 0–44)
AST: 22 U/L (ref 15–41)
Albumin: 3.4 g/dL — ABNORMAL LOW (ref 3.5–5.0)
Alkaline Phosphatase: 54 U/L (ref 38–126)
Anion gap: 8 (ref 5–15)
BUN: 8 mg/dL (ref 8–23)
CO2: 22 mmol/L (ref 22–32)
Calcium: 9.1 mg/dL (ref 8.9–10.3)
Chloride: 107 mmol/L (ref 98–111)
Creatinine, Ser: 0.8 mg/dL (ref 0.44–1.00)
GFR, Estimated: >60 mL/min (ref >60)
Glucose, Bld: 193 mg/dL — ABNORMAL HIGH (ref 70–99)
Potassium: 3.8 mmol/L (ref 3.5–5.1)
Sodium: 137 mmol/L (ref 135–145)
Total Bilirubin: 0.9 mg/dL (ref 0.3–1.2)
Total Protein: 7 g/dL (ref 6.5–8.1)

## 2020-09-11 LAB — CBC
HCT: 38.4 % (ref 36.0–46.0)
Hemoglobin: 13.2 g/dL (ref 12.0–15.0)
MCH: 28.4 pg (ref 26.0–34.0)
MCHC: 34.4 g/dL (ref 30.0–36.0)
MCV: 82.8 fL (ref 80.0–100.0)
Platelets: 228 10*3/uL (ref 150–400)
RBC: 4.64 MIL/uL (ref 3.87–5.11)
RDW: 14.4 % (ref 11.5–15.5)
WBC: 4.8 10*3/uL (ref 4.0–10.5)
nRBC: 0 % (ref 0.0–0.2)

## 2020-09-11 LAB — SARS CORONAVIRUS 2 (TAT 6-24 HRS): SARS Coronavirus 2: NEGATIVE

## 2020-09-11 MED ORDER — LEVOTHYROXINE SODIUM 50 MCG PO TABS
50.0000 ug | ORAL_TABLET | Freq: Every day | ORAL | Status: DC
  Administered 2020-09-11 – 2020-09-12 (×2): 50 ug via ORAL
  Filled 2020-09-11: qty 2
  Filled 2020-09-11: qty 1

## 2020-09-11 MED ORDER — POTASSIUM CHLORIDE 20 MEQ PO PACK
40.0000 meq | PACK | Freq: Once | ORAL | Status: AC
  Administered 2020-09-11: 40 meq via ORAL
  Filled 2020-09-11: qty 2

## 2020-09-11 MED ORDER — PANTOPRAZOLE SODIUM 40 MG PO TBEC
40.0000 mg | DELAYED_RELEASE_TABLET | Freq: Every day | ORAL | Status: DC
  Administered 2020-09-11 – 2020-09-12 (×2): 40 mg via ORAL
  Filled 2020-09-11 (×2): qty 1

## 2020-09-11 MED ORDER — TRAMADOL HCL 50 MG PO TABS
50.0000 mg | ORAL_TABLET | Freq: Once | ORAL | Status: AC
  Administered 2020-09-11: 50 mg via ORAL
  Filled 2020-09-11: qty 1

## 2020-09-11 MED ORDER — TRAMADOL HCL 50 MG PO TABS
50.0000 mg | ORAL_TABLET | Freq: Four times a day (QID) | ORAL | Status: DC | PRN
  Administered 2020-09-11 – 2020-09-12 (×2): 50 mg via ORAL
  Filled 2020-09-11 (×2): qty 1

## 2020-09-11 MED ORDER — CARBAMAZEPINE ER 200 MG PO TB12
200.0000 mg | ORAL_TABLET | Freq: Every day | ORAL | Status: DC
  Administered 2020-09-11 – 2020-09-12 (×2): 200 mg via ORAL
  Filled 2020-09-11 (×2): qty 1

## 2020-09-11 MED ORDER — METOPROLOL SUCCINATE ER 25 MG PO TB24: 25.0000 mg | ORAL_TABLET | Freq: Every day | ORAL | Status: DC

## 2020-09-11 MED ORDER — PREGABALIN 25 MG PO CAPS: 75.0000 mg | ORAL_CAPSULE | Freq: Every day | ORAL | Status: DC

## 2020-09-11 MED ORDER — DULOXETINE HCL 30 MG PO CPEP
30.0000 mg | ORAL_CAPSULE | Freq: Every day | ORAL | Status: DC
  Administered 2020-09-11 – 2020-09-12 (×2): 30 mg via ORAL
  Filled 2020-09-11 (×2): qty 1

## 2020-09-11 MED ORDER — ENOXAPARIN SODIUM 40 MG/0.4ML IJ SOSY
40.0000 mg | PREFILLED_SYRINGE | INTRAMUSCULAR | Status: DC
  Administered 2020-09-11: 40 mg via SUBCUTANEOUS
  Filled 2020-09-11: qty 0.4

## 2020-09-11 MED ORDER — LORAZEPAM 2 MG/ML IJ SOLN
INTRAMUSCULAR | Status: AC
  Filled 2020-09-11: qty 1

## 2020-09-11 MED ORDER — CHLORTHALIDONE 25 MG PO TABS
25.0000 mg | ORAL_TABLET | Freq: Every day | ORAL | Status: DC
  Filled 2020-09-11: qty 1

## 2020-09-11 MED ORDER — AMLODIPINE BESYLATE 5 MG PO TABS: 5.0000 mg | ORAL_TABLET | Freq: Every day | ORAL | Status: DC

## 2020-09-11 MED ORDER — AMLODIPINE BESYLATE 10 MG PO TABS
10.0000 mg | ORAL_TABLET | Freq: Every day | ORAL | Status: DC
  Administered 2020-09-12: 10 mg via ORAL
  Filled 2020-09-11: qty 1

## 2020-09-11 NOTE — Progress Notes (Signed)
  Date: 09/11/2020  Patient name: Andrea Lowery  Medical record number: [id number]  Date of birth: 11/24/1940        I have seen and evaluated this patient and I have discussed the plan of care with the house staff. Please see Dr. Karena Addison note for complete details. I concur with her findings and plan.     She also has a history of asthma, will restart her home medications.  Apparently with a history of hypothyroidism and diabetes.  She is not on any medications for the diabetes.  Home synthroid should be continued.  Follow up for further neurological care and possibly to see the periodontist should be arranged.  If head pain is improved in next 24 - 48 hours, she could return home.   Sid Falcon, MD 09/11/2020, 3:55 PM

## 2020-09-11 NOTE — ED Notes (Signed)
Placed Breakfast Order 

## 2020-09-11 NOTE — ED Notes (Signed)
Patient returned from MRI with ABCs intact, alert and oriented x4, respirations even and unlabored, reconnected to cardiac monitor, currently in sinus rhythm. Patient with no needs at this time, call bell in reach, bed in low, locked position with side rails up x2.

## 2020-09-11 NOTE — Progress Notes (Addendum)
Subjective: I seen and evaluated Ms. Sliney at bedside. She states that her left sided headache has improved this morning. This headache started in 2016 in which a temporal artery biopsy was performed at that time, which was negative, however the left sided headache never really went away, she states. She describes it as an intermittent daily pain that's usually a 6/10 and exacerbates to a 10/10 when she chews food on her left side. At first, she believed the pain was associated with her allergies for which she takes zyrtec. However, the left sided headache persisted. She reports a history of sinusitis and has had a previous surgery on her sinuses at [facility]. The left sided HA continued despite these interventions.  She describes the pain as a sharp shooting pain, that is worse when she chews. She says the pain is localized to her left temple and radiates to the left jaw, especially when she chews.  Due to the radiating pain she now only chews on her right side.She reports associated left eye blurry vision, she denies loss of vision ever in the left eye since symptoms began.  She has a history of migraines in which she sits in a dark room that improves the migraine pain however this left temporal headache persists.  She denies lacrimation or rhinorrhea associated with the left-sided headache.  But reports in the morning her left eye feels like they are "stuck together" upon awakening.  She states she recently had an eye exam with Dr. Carolynn Sayers concerning the left eye blurry vision and was prescribed eyedrops.  She has tried Tylenol, topiramate, and ibuprofen with no relief.  She states the pain is painful to touch on the left side of her face and therefore cannot sleep on the left side.  She also reports prior to coming to the hospital, she went to church on Sunday and felt a staggering pain on the left side of her head and experienced dizziness upon standing.  She also has noticed eleavted BP ranging [id number]  systolic and [id number] diastolic. She lives with her demented sister, for whom she is her primary caregiver and her grandchildren. Ms. Saman states she has been under a lot of stress lately due to caring for her sister and the recent passing of her 16 year old grandson by MVA.  She admits that she does take her blood pressure medication metoprolol and amlodipine but states continued elevated blood pressures.  In the ED she was given steroids and she reports that that did not help the pain.  She was also given Ativan prior to her MRI and states the pain felt better because she was able to fall asleep.  Review of Systems  Constitutional:  Negative for chills and fever.  Eyes:  Positive for blurred vision and pain (left eye). Negative for discharge.  Respiratory:  Negative for shortness of breath.   Cardiovascular:  Negative for chest pain, palpitations and leg swelling.  Gastrointestinal:  Negative for abdominal pain, heartburn, nausea and vomiting.  Neurological:  Positive for dizziness and headaches (left sided).   Objective:  Vital signs in last 24 hours: Vitals:   09/10/20 2230 09/10/20 2300 09/11/20 0300 09/11/20 0600  BP: (!) 146/63 (!) 143/79 115/61 138/72  Pulse: 62 (!) 54 75 75  Resp: '16 15 16 17  '$ Temp:      SpO2: 97% 96% 95% 94%   Physical Exam HENT:     Head: Normocephalic and atraumatic. No left periorbital erythema.     Jaw: Tenderness (  with palpation) present. No swelling.     Comments: Tenderness to palpation of left temporal lobe    Left Ear: Tympanic membrane, ear canal and external ear normal. No decreased hearing noted. Tympanic membrane is not bulging.  Eyes:     Extraocular Movements:     Left eye: Normal extraocular motion.     Conjunctiva/sclera:     Left eye: Left conjunctiva is not injected. No chemosis or exudate. Cardiovascular:     Rate and Rhythm: Normal rate and regular rhythm.     Heart sounds: Normal heart sounds, S1 normal and S2 normal.  Pulmonary:      Effort: Pulmonary effort is normal.     Breath sounds: Normal breath sounds.  Musculoskeletal:     Right lower leg: No edema.     Left lower leg: No edema.  Neurological:     General: No focal deficit present.     Mental Status: She is alert and oriented to person, place, and time.  Psychiatric:        Attention and Perception: Attention normal.        Behavior: Behavior normal. Behavior is cooperative.     Assessment/Plan:  Active Problems:   Essential hypertension   Left temporal headache Left temporal headache Trigeminal Nueralgia Patient presents with L temporal headache, jaw pain, and generalized L facial tenderness with L blurry vision.  Patient does report a similar episode in 2016 but temporal artery biopsy at that time was unremarkable. Due to  new headache or change in characteristics of preexisting headache, abrupt onset of visual disturbances,  jaw claudication, temporal artery tenderness to palpation. There was high suspicion for giant cell arteritis.  Ophthalmology was consulted and recommended high-dose steroid to be given in the ED.  CRP and sed rate were within normal limit.  Neurology evaluation to rule out stroke.CTA head/neck showed no emergent large vessel occlusion or high-grade stenosis of the intracranial or cervical arteries, bilateral carotid bifurcation atherosclerosis without hemodynamically significant stenosis by NASCET criteria.CT head showed stable age related cerebral atrophy, ventriculomegaly and periventricular white matter disease but no acute intracranial findings or mass lesions.MRI showed no acute intracranial abnormality, findings of chronic small vessel ischemia and generalized volume loss.  Upon further questioning, Ms. Leatherwood states that he left temporal headache radiates to her jaw, and worse with chewing.  On physical exam palpation of the TMJ elicits pain.  Palpation of the left temporal region elicits pain.  Inner ear examination shows no  bulging of the TM.  Ms. Ceja describes the pain as sharp intermittent throughout the day.  High suspicion for trigeminal neuralgia due to negative findings for giant cell arteritis work-up.  Characteristically jaw pain is elicited with chewing and palpation of the TMJ. Neurology reviewed MRI and reports negative for any sign of stroke. Previous note from neurology appointment back in 2016 revealed patient was diagnosed with trigeminal neuralgia and giant cell arteritis has been ruled out. Patient was suppose scheduled for CTA to also r/u ICA dissection but patient did not follow up. -- Started on carbamazepine 200 mg daily, titrate up as tolerated for neuropathy, see if this relieves pain. --Started on tramadol '50mg'$  for pain relief --Started on Cymbalta '30mg'$    --SPEP ordered to determine giant cell arteritis versus neuralgia -- Pending Opthalmology consult -- Given 1000 mg methylprednisolone in ED; has been stopped today -- Tylenol 650 mg PRN pain  Hypertension Patient with history of hypertension with BP max of 217/93 in ED today. She states  that she is adherent to her home medications, metoprolol and amlodipine.  Ms. Barrero was given IV hydralazine in the ED to control blood pressure.  Her BP lowered appropriately, overnight systolic ranging [id number] ranging [id number].  Current blood pressure 148/91.  Will restart home medication.  Ms. Waterson reports elevated blood pressures for the past several weeks.  This could possibly be linked to her current symptoms of unusual headaches and changes in her vision. --Continue to monitor BP --Restart home medication Amlodipine '10mg'$  daily   Prior to Admission Living Arrangement: Anticipated Discharge Location: Barriers to Discharge: Dispo: Anticipated discharge in approximately 1-2 day(s).   Timothy Lasso, MD 09/11/2020, 7:01 AM Pager: [phone] After 5pm on weekdays and 1pm on weekends: On Call pager [phone]

## 2020-09-11 NOTE — ED Notes (Signed)
Transported to MRI

## 2020-09-12 DIAGNOSIS — I10 Essential (primary) hypertension: Secondary | ICD-10-CM | POA: Diagnosis not present

## 2020-09-12 DIAGNOSIS — R519 Headache, unspecified: Secondary | ICD-10-CM | POA: Diagnosis not present

## 2020-09-12 LAB — PROTEIN ELECTROPHORESIS, SERUM
A/G Ratio: 1 (ref 0.7–1.7)
Albumin ELP: 3.4 g/dL (ref 2.9–4.4)
Alpha-1-Globulin: 0.2 g/dL (ref 0.0–0.4)
Alpha-2-Globulin: 0.7 g/dL (ref 0.4–1.0)
Beta Globulin: 0.9 g/dL (ref 0.7–1.3)
Gamma Globulin: 1.5 g/dL (ref 0.4–1.8)
Globulin, Total: 3.3 g/dL (ref 2.2–3.9)
M-Spike, %: 1.1 g/dL — ABNORMAL HIGH
Total Protein ELP: 6.7 g/dL (ref 6.0–8.5)

## 2020-09-12 LAB — BASIC METABOLIC PANEL
Anion gap: 8 (ref 5–15)
BUN: 13 mg/dL (ref 8–23)
CO2: 22 mmol/L (ref 22–32)
Calcium: 9.2 mg/dL (ref 8.9–10.3)
Chloride: 102 mmol/L (ref 98–111)
Creatinine, Ser: 1 mg/dL (ref 0.44–1.00)
GFR, Estimated: 58 mL/min — ABNORMAL LOW (ref >60)
Glucose, Bld: 275 mg/dL — ABNORMAL HIGH (ref 70–99)
Potassium: 4.2 mmol/L (ref 3.5–5.1)
Sodium: 132 mmol/L — ABNORMAL LOW (ref 135–145)

## 2020-09-12 MED ORDER — TRAMADOL HCL 50 MG PO TABS: 50.0000 mg | ORAL_TABLET | Freq: Four times a day (QID) | ORAL | 0 refills | Status: AC | PRN

## 2020-09-12 MED ORDER — AMLODIPINE BESYLATE 10 MG PO TABS: 10.0000 mg | ORAL_TABLET | Freq: Every day | ORAL | 0 refills | Status: DC

## 2020-09-12 MED ORDER — DULOXETINE HCL 30 MG PO CPEP: 30.0000 mg | ORAL_CAPSULE | Freq: Every day | ORAL | 0 refills | Status: DC

## 2020-09-12 MED ORDER — CARBAMAZEPINE ER 200 MG PO TB12: 200.0000 mg | ORAL_TABLET | Freq: Every day | ORAL | 0 refills | Status: DC

## 2020-09-12 MED ORDER — HYDRALAZINE HCL 10 MG PO TABS: 10.0000 mg | ORAL_TABLET | Freq: Three times a day (TID) | ORAL | 0 refills | Status: DC

## 2020-09-12 NOTE — Progress Notes (Signed)
AVS review w/ pt. All questions and concerns answered. Transportation to disposition provided per pt's son.

## 2020-09-12 NOTE — Discharge Summary (Addendum)
Name: Andrea Lowery MRN: [id number] DOB: [birth year] 80 y.o. 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 Hypertension Diabetes Mellitus  Discharge Medications: Allergies as of 09/12/2020       Reactions   Bee Venom Anaphylaxis   Yellow jackets Yellow jackets   Penicillins Hives   Has patient had a PCN reaction causing immediate rash, facial/tongue/throat swelling, SOB or lightheadedness with hypotension: Yes Has patient had a PCN reaction causing severe rash involving mucus membranes or skin necrosis: No Has patient had a PCN reaction that required hospitalization No Has patient had a PCN reaction occurring within the last 10 years: No If all of the above answers are "NO", then may proceed with Cephalosporin use.        Medication List     STOP taking these medications    Accu-Chek Aviva Plus test strip Generic drug: glucose blood   Accu-Chek Softclix Lancets lancets   chlorthalidone 25 MG tablet Commonly known as: HYGROTON   cycloSPORINE 0.05 % ophthalmic emulsion Commonly known as: RESTASIS   Jardiance 25 MG Tabs tablet Generic drug: empagliflozin   LORazepam 1 MG tablet Commonly known as: ATIVAN   Potassium Chloride ER 20 MEQ Tbcr   potassium chloride SA 20 MEQ tablet Commonly known as: KLOR-CON       TAKE these medications    albuterol 108 (90 Base) MCG/ACT inhaler Commonly known as: VENTOLIN HFA INHALE 2 PUFFS INTO THE LUNGS EVERY 4 HOURS AS NEEDED FOR WHEEZING OR SHORTNESS OF BREATH What changed:  reasons to take this additional instructions   alendronate 70 MG tablet Commonly known as: FOSAMAX Take 70 mg by mouth every Wednesday.   amLODipine 10 MG tablet Commonly known as: NORVASC Take 1 tablet (10 mg total) by mouth daily. Start taking on: September 13, 2020   B-D SINGLE USE SWABS REGULAR Pads    budesonide-formoterol 160-4.5 MCG/ACT inhaler Commonly known as: Symbicort INHALE 2 PUFFS BY MOUTH INTO THE LUNGS TWICE DAILY RINSE GARGLE AND SPIT AFTER USE   carbamazepine 200 MG 12 hr tablet Commonly known as: TEGRETOL XR Take 1 tablet (200 mg total) by mouth daily. Start taking on: September 13, 2020   cetirizine 10 MG tablet Commonly known as: ZYRTEC Take 1 tablet 1-2 times a day for allergies.   DULoxetine 30 MG capsule Commonly known as: CYMBALTA Take 1 capsule (30 mg total) by mouth daily. Start taking on: September 13, 2020   EPINEPHrine 0.3 mg/0.3 mL Soaj injection Commonly known as: EPI-PEN Use as directed for severe allergic reaction   esomeprazole 40 MG capsule Commonly known as: NEXIUM Take 1 capsule (40 mg total) by mouth 2 (two) times daily.   Fasenra 30 MG/ML Sosy Generic drug: Benralizumab INJECT '30MG'$  SUBCUTANEOUSLY EVERY 8 WEEKS   fluticasone 50 MCG/ACT nasal spray Commonly known as: FLONASE SHAKE LIQUID AND USE 2 SPRAYS IN EACH NOSTRIL DAILY What changed: See the new instructions.   hydrALAZINE 10 MG tablet Commonly known as: APRESOLINE Take 1 tablet (10 mg total) by mouth 3 (three) times daily.   hydrOXYzine 10 MG tablet Commonly known as: ATARAX/VISTARIL 10 mg 3 (three) times daily as needed for itching.   ipratropium-albuterol 0.5-2.5 (3) MG/3ML Soln Commonly known as: DUONEB Take 3 mLs by nebulization every 6 (six) hours as needed.   ketoconazole 2 % cream Commonly known as: NIZORAL   levothyroxine 50 MCG  tablet Commonly known as: SYNTHROID take 1 tablet by mouth once daily   metoprolol succinate 25 MG 24 hr tablet Commonly known as: TOPROL-XL TAKE 1 TABLET(25 MG) BY MOUTH DAILY What changed: See the new instructions.   montelukast 10 MG tablet Commonly known as: SINGULAIR TAKE 1 TABLET(10 MG) BY MOUTH AT BEDTIME What changed: See the new instructions.   rosuvastatin 40 MG tablet Commonly known as: CRESTOR TAKE 1 TABLET(40 MG) BY  MOUTH DAILY What changed: See the new instructions.   topiramate 50 MG tablet Commonly known as: TOPAMAX Take 50 mg by mouth daily.   traMADol 50 MG tablet Commonly known as: ULTRAM Take 1 tablet (50 mg total) by mouth every 6 (six) hours as needed for up to 5 days for severe pain or moderate pain.   triamcinolone cream 0.1 % Commonly known as: KENALOG Apply 1 application topically daily.   Vitamin D (Ergocalciferol) 1.25 MG (50000 UNIT) Caps capsule Commonly known as: DRISDOL Take 50,000 Units by mouth every 7 (seven) days. Pt takes on Wednesday of each week        Disposition and follow-up:   Andrea Lowery was discharged from [facility] in Stable condition.  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 She is to follow up with [facility] Neurology for further assessment.   Hypertension- On arrival BP 217/93 and was given IV hydralazine. Once her BP stabilized, her home medications were restarted, Amlodipine '10mg'$ . Will add a second medication hydralazine '10mg'$  TID to replace chlorthalidone for BP control.   Diabetes Mellitus- Currently does not take any diabetic medications. Glucose levels increased to high 200s following steroid treatment on admission. Likely the cause of hyperglycemia. Follow up with PCP regarding diabetic management. Recheck HA1c  2.  Labs / imaging needed at time of follow-up: CTA; HA1c  3.  Pending labs/ test needing follow-up: None  Follow-up Appointments:  Follow-up Information     Hayden Rasmussen, MD. Schedule an appointment as soon as possible for a visit in 1 week(s).   Specialty: Family Medicine Contact information: [place] [address] [phone]         [facility] [place] Follow up in 1 week(s).   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 Ophthalmology was consulted and recommended high-dose steroids given in the ED for possible giant cell arteritis. CRP and sed rate were within normal limits.  CTA of head and neck showed no emergent large vessel occlusions. CT showed stable age-related cerebral atrophy and no mass lesions.  MRI showed no acute intracranial abnormalities.  Patient meets diagnostic criteria for TN, however, DDx includes TMJ and periodontal disease given her gum pain and jaw pain.  We did start her on treatment for trigeminal neuralgia to see if this would help and advised her to follow up with Neurology. Patient started on carbamazepine 200 mg, Cymbalta '30mg'$ , and tramadol 50 for pain relief.  Would also recommend that she follow up with a periodontist related to her gum/jaw pain.  She may need different dentures, better fit and/or further evaluation of TMJ.  Hypertension-on arrival patient with blood pressure of 217/93.  She was treated with IV hydralazine to control blood pressure.  Her blood pressure lowered appropriately.  Home meds, amlodipine 10 mg were restarted.   She requested initiation of hydralazine at discharge and we did start her on Hydralazine '10mg'$  TID.  She will need to follow up with her PCP for further titration of medications.   Discharge Exam:   BP (!) 149/70 (BP Location: Left Arm)   Pulse 65   Temp 97.8 F (36.6 C) (Oral)   Resp 16   Ht '5\' 7"'$  (1.702 m)   Wt 86.3 kg   SpO2 96%   BMI 29.80 kg/m  Discharge exam: Physical Exam Constitutional:      General: She is awake.  HENT:     Head: Normocephalic and atraumatic.  Cardiovascular:     Rate and Rhythm: Normal rate and regular rhythm.     Heart sounds: Normal heart sounds, S1 normal and S2 normal.  Pulmonary:      Effort: Pulmonary effort is normal.     Breath sounds: Normal breath sounds.  Musculoskeletal:     Right lower leg: 2+ Edema present.     Left lower leg: 2+ Edema present.  Skin:    General: Skin is warm and dry.  Neurological:     General: No focal deficit present.     Mental Status: She is alert.  Psychiatric:        Attention and Perception: Attention normal.        Behavior: Behavior normal. Behavior is cooperative.     Pertinent Labs, Studies, and Procedures:  CBC Latest Ref Rng & Units 09/11/2020 09/10/2020 09/10/2020  WBC 4.0 - 10.5 K/uL 4.8 - 5.7  Hemoglobin 12.0 - 15.0 g/dL 13.2 13.9 13.6  Hematocrit 36.0 - 46.0 % 38.4 41.0 40.3  Platelets 150 - 400 K/uL 228 - 245   BMP Latest Ref Rng & Units 09/12/2020 09/11/2020 09/10/2020  Glucose 70 - 99 mg/dL 275(H) 193(H) 132(H)  BUN 8 - 23 mg/dL 13 8 6(L)  Creatinine 0.44 - 1.00 mg/dL 1.00 0.80 0.80  BUN/Creat Ratio 12 - 28 - - -  Sodium 135 - 145 mmol/L 132(L) 137 142  Potassium 3.5 - 5.1 mmol/L 4.2 3.8 3.7  Chloride 98 - 111 mmol/L 102 107 105  CO2 22 - 32 mmol/L 22 22 -  Calcium 8.9 - 10.3 mg/dL 9.2 9.1 -   CMP Latest Ref Rng & Units 09/12/2020 09/11/2020 09/10/2020  Glucose 70 - 99 mg/dL 275(H) 193(H) 132(H)  BUN 8 - 23 mg/dL 13 8 6(L)  Creatinine 0.44 - 1.00 mg/dL 1.00 0.80 0.80  Sodium 135 - 145 mmol/L 132(L) 137 142  Potassium 3.5 - 5.1 mmol/L 4.2 3.8 3.7  Chloride 98 - 111 mmol/L 102 107 105  CO2 22 - 32 mmol/L 22 22 -  Calcium 8.9 - 10.3 mg/dL 9.2 9.1 -  Total Protein 6.5 - 8.1 g/dL - 7.0 -  Total Bilirubin 0.3 - 1.2 mg/dL - 0.9 -  Alkaline Phos 38 - 126 U/L - 54 -  AST 15 - 41 U/L - 22 -  ALT 0 - 44 U/L - 21 -   CRP: <.5 Sed Rate: 21 CTA:No emergent large vessel occlusion or high-grade stenosis of the intracranial or cervical arteries. Bilateral carotid bifurcation atherosclerosis without hemodynamically significant stenosis by NASCET criteria. [id number] age related cerebral atrophy, ventriculomegaly and periventricular  white matter disease.No acute intracranial findings or mass lesions. MRI:No acute intracranial abnormality. Findings of chronic small vessel ischemia and  generalized volume loss.  Discharge Instructions: Discharge Instructions     Call MD for:  difficulty breathing, headache or visual disturbances   Complete by: As directed    Call MD for:  extreme fatigue   Complete by: As directed    Call MD for:  hives   Complete by: As directed    Call MD for:  persistant dizziness or light-headedness   Complete by: As directed    Call MD for:  persistant nausea and vomiting   Complete by: As directed    Call MD for:  redness, tenderness, or signs of infection (pain, swelling, redness, odor or green/yellow discharge around incision site)   Complete by: As directed    Call MD for:  severe uncontrolled pain   Complete by: As directed    Call MD for:  temperature >100.4   Complete by: As directed    Diet - low sodium heart healthy   Complete by: As directed    Discharge instructions   Complete by: As directed    Ms. Ouida Sills, You were admitted because of a headache. Initially we were concerned for inflammation of your arteries, but we believe your symptoms are most likely caused by trigeminal neuralgia and joint pain in your jaw. Please see the following notes:  For your pain, we are sending you out on the following medications: Cymbalta (duloxetine) '30mg'$  daily Tegretol (carbamazepine) '200mg'$  daily Tramadol '50mg'$  every 6 hours as needed for five days  Please make sure to follow-up with your neurologist [phone]). In addition, you will need to set up an appointment with a peridontist (for gums). Your primary care physician can put in a referral to see one.  It was a pleasure meeting you, Ms. Hurn. I wish you the best and hope you stay happy and healthy!  Thank you, Sanjuan Dame, MD   Increase activity slowly   Complete by: As directed      1. Be sure to follow up with Neurology  appointment to ensure proper use of medications for Trigeminal Neuralgia. And any further assessment/imaging necessary. 2. Continue to monitor blood pressure; take blood pressure medications daily.   Signed: Timothy Lasso, MD 09/12/2020, 1:26 PM   Pager: [phone]

## 2020-09-12 NOTE — Plan of Care (Signed)
  Problem: Education: Goal: Knowledge of General Education information will improve Description Including pain rating scale, medication(s)/side effects and non-pharmacologic comfort measures Outcome: Progressing   

## 2020-09-12 NOTE — Progress Notes (Signed)
   09/12/20 1030 09/12/20 1031 09/12/20 1032  Vitals  Temp 97.8 F (36.6 C)  --   --   Temp Source Oral  --   --   BP (!) 149/70  --   --   MAP (mmHg) 92  --   --   BP Location Left Arm  --   --   BP Method Automatic  --   --   Patient Position (if appropriate) Lying  --   --   Pulse Rate 65  --   --   Pulse Rate Source Dinamap  --   --   Resp 16  --   --   Level of Consciousness  Level of Consciousness Alert  --   --   Orthostatic Lying   BP- Lying 149/65  --   --   Pulse- Lying 65  --   --   Orthostatic Sitting  BP- Sitting  --  159/79  --   Pulse- Sitting  --  73  --   Orthostatic Standing at 0 minutes  BP- Standing at 0 minutes  --   --  159/76  Pulse- Standing at 0 minutes  --   --  72  Orthostatic Standing at 3 minutes  BP- Standing at 3 minutes  --   --   --   Pulse- Standing at 3 minutes  --   --   --     09/12/20 1034  Vitals  Temp  --   Temp Source  --   BP  --   MAP (mmHg)  --   BP Location  --   BP Method  --   Patient Position (if appropriate)  --   Pulse Rate  --   Pulse Rate Source  --   Resp  --   Level of Consciousness  Level of Consciousness  --   Orthostatic Lying   BP- Lying  --   Pulse- Lying  --   Orthostatic Sitting  BP- Sitting  --   Pulse- Sitting  --   Orthostatic Standing at 0 minutes  BP- Standing at 0 minutes  --   Pulse- Standing at 0 minutes  --   Orthostatic Standing at 3 minutes  BP- Standing at 3 minutes 170/83  Pulse- Standing at 3 minutes 73   ORTHOSTATIC AS ABOVE. Patient denied any HA or dz at this time.

## 2020-09-15 ENCOUNTER — Encounter: Payer: Self-pay | Admitting: Dermatology

## 2020-09-15 NOTE — Progress Notes (Signed)
   New Patient   Subjective  Andrea Lowery is a 80 y.o. female who presents for the following: Skin Problem (Has some lesions on shoulder left. It itches per patient. Also check patients face. She has lots of lesions she wants checked. ).  General skin examination, multiple areas that she would like examined. Location:  Duration:  Quality:  Associated Signs/Symptoms: Modifying Factors:  Severity:  Timing: Context:    The following portions of the chart were reviewed this encounter and updated as appropriate:  Tobacco  Allergies  Meds  Problems  Med Hx  Surg Hx  Fam Hx      Objective  Well appearing patient in no apparent distress; mood and affect are within normal limits. Neck - Anterior Recent onset patchy dermatitis mainly on upper front torso and neck.  Denies any known contactants such as perfumes or clothing with unique material in the affected area.  Clinically this is more likely a contact dermatitis than an adult eczema.  Left Lower Cutaneous Lip, Mid Lower Cutaneous Lip, Right Lower Cutaneous Lip Half millimeter hard upper dermal white papules  Left Malar Cheek, Right Malar Cheek Light brown flattopped textured 5 mm papules  Left Lateral Canthus, Right Lateral Canthus 1 mm pedunculated flesh-colored papules  Left Buccal Cheek, Left Supraclavicular Area, Right Upper Back Noninflamed 4 to 42m deep dermal right papules    A full examination was performed including scalp, head, eyes, ears, nose, lips, neck, chest, axillae, abdomen, back, buttocks, bilateral upper extremities, bilateral lower extremities, hands, feet, fingers, toes, fingernails, and toenails. All findings within normal limits unless otherwise noted below.  Areas beneath undergarments not fully examined.   Assessment & Plan  Contact dermatitis, unspecified contact dermatitis type, unspecified trigger Neck - Anterior  Topical triamcinolone daily after bathing for 3 weeks; follow-up by  phone at that time.  triamcinolone cream (KENALOG) 0.1 % - Neck - Anterior Apply 1 application topically daily.  Milia (3) Left Lower Cutaneous Lip; Right Lower Cutaneous Lip; Mid Lower Cutaneous Lip  If patient is interested in removal I encouraged her to first check with a medical spa (likely less costly).  Seborrheic keratosis (2) Left Malar Cheek; Right Malar Cheek  Benign okay to leave if stable  Skin tag (2) Left Lateral Canthus; Right Lateral Canthus  Patient may choose to have removed in future  Epidermal cyst (3) Right Upper Back; Left Buccal Cheek; Left Supraclavicular Area  Patient may choose to schedule surgical removal or leave these.

## 2020-09-19 ENCOUNTER — Other Ambulatory Visit: Payer: Self-pay | Admitting: Student

## 2020-09-23 ENCOUNTER — Other Ambulatory Visit: Payer: Self-pay | Admitting: Allergy and Immunology

## 2020-09-29 ENCOUNTER — Other Ambulatory Visit: Payer: Self-pay | Admitting: Allergy and Immunology

## 2020-10-03 ENCOUNTER — Ambulatory Visit (INDEPENDENT_AMBULATORY_CARE_PROVIDER_SITE_OTHER): Payer: Medicare HMO | Admitting: *Deleted

## 2020-10-03 DIAGNOSIS — J309 Allergic rhinitis, unspecified: Secondary | ICD-10-CM | POA: Diagnosis not present

## 2020-10-09 ENCOUNTER — Other Ambulatory Visit: Payer: Self-pay | Admitting: Student

## 2020-10-11 ENCOUNTER — Ambulatory Visit (INDEPENDENT_AMBULATORY_CARE_PROVIDER_SITE_OTHER): Payer: Medicare HMO

## 2020-10-11 ENCOUNTER — Other Ambulatory Visit: Payer: Self-pay

## 2020-10-11 DIAGNOSIS — T63441D Toxic effect of venom of bees, accidental (unintentional), subsequent encounter: Secondary | ICD-10-CM

## 2020-10-12 ENCOUNTER — Ambulatory Visit
Admission: EM | Admit: 2020-10-12 | Discharge: 2020-10-12 | Disposition: A | Discharge status: Home / Self Care | Payer: Medicare HMO | Attending: Urgent Care | Admitting: Urgent Care

## 2020-10-12 ENCOUNTER — Other Ambulatory Visit: Payer: Self-pay

## 2020-10-12 DIAGNOSIS — B9789 Other viral agents as the cause of diseases classified elsewhere: Secondary | ICD-10-CM

## 2020-10-12 DIAGNOSIS — R0989 Other specified symptoms and signs involving the circulatory and respiratory systems: Secondary | ICD-10-CM

## 2020-10-12 DIAGNOSIS — Z20822 Contact with and (suspected) exposure to covid-19: Secondary | ICD-10-CM

## 2020-10-12 DIAGNOSIS — J988 Other specified respiratory disorders: Secondary | ICD-10-CM

## 2020-10-12 MED ORDER — CETIRIZINE HCL 5 MG PO TABS: 5.0000 mg | ORAL_TABLET | Freq: Every day | ORAL | 0 refills | Status: DC

## 2020-10-12 NOTE — ED Provider Notes (Signed)
[place]   MRN: [id number] DOB: 1940-10-20  Subjective:   Andrea Lowery is a 80 y.o. female presenting for 1 day history of acute onset of runny nose.  Had COVID exposure and would like to be checked for this.  Has had COVID vaccination, booster.  Denies cough, chest pain, shortness of breath, wheezing.  She does have a history of asthma but does not feel any of those symptoms now.  She does have an albuterol inhaler that she could use.   Current Facility-Administered Medications:    Benralizumab SOSY 30 mg, 30 mg, Subcutaneous, Q8 Weeks, Kozlow, Donnamarie Poag, MD, 30 mg at 09/07/20 1346  Current Outpatient Medications:    albuterol (VENTOLIN HFA) 108 (90 Base) MCG/ACT inhaler, INHALE 2 PUFFS INTO THE LUNGS EVERY 4 HOURS AS NEEDED FOR WHEEZING OR SHORTNESS OF BREATH, Disp: 18 g, Rfl: 1   Alcohol Swabs (B-D SINGLE USE SWABS REGULAR) PADS, , Disp: , Rfl:    alendronate (FOSAMAX) 70 MG tablet, Take 70 mg by mouth every Wednesday. , Disp: , Rfl:    amLODipine (NORVASC) 10 MG tablet, Take 1 tablet (10 mg total) by mouth daily., Disp: 30 tablet, Rfl: 0   budesonide-formoterol (SYMBICORT) 160-4.5 MCG/ACT inhaler, INHALE 2 PUFFS BY MOUTH INTO THE LUNGS TWICE DAILY RINSE GARGLE AND SPIT AFTER USE, Disp: 10.2 g, Rfl: 0   carbamazepine (TEGRETOL XR) 200 MG 12 hr tablet, Take 1 tablet (200 mg total) by mouth daily., Disp: 30 tablet, Rfl: 0   cetirizine (ZYRTEC) 10 MG tablet, Take 1 tablet 1-2 times a day for allergies., Disp: 180 tablet, Rfl: 1   DULoxetine (CYMBALTA) 30 MG capsule, Take 1 capsule (30 mg total) by mouth daily., Disp: 30 capsule, Rfl: 0   EPINEPHrine 0.3 mg/0.3 mL IJ SOAJ injection, Use as directed for severe allergic reaction, Disp: 2 each, Rfl: 1   esomeprazole (NEXIUM) 40 MG capsule, Take 1 capsule (40 mg total) by mouth 2 (two) times daily., Disp: 180 capsule, Rfl: 1   FASENRA 30 MG/ML SOSY, INJECT '30MG'$  SUBCUTANEOUSLY EVERY 8 WEEKS, Disp: 1 mL, Rfl: 6   fluticasone  (FLONASE) 50 MCG/ACT nasal spray, SHAKE LIQUID AND USE 2 SPRAYS IN EACH NOSTRIL DAILY, Disp: 48 g, Rfl: 0   hydrALAZINE (APRESOLINE) 10 MG tablet, Take 1 tablet (10 mg total) by mouth 3 (three) times daily., Disp: 30 tablet, Rfl: 0   hydrOXYzine (ATARAX/VISTARIL) 10 MG tablet, 10 mg 3 (three) times daily as needed for itching., Disp: , Rfl:    ipratropium-albuterol (DUONEB) 0.5-2.5 (3) MG/3ML SOLN, Take 3 mLs by nebulization every 6 (six) hours as needed., Disp: 360 mL, Rfl: 0   ketoconazole (NIZORAL) 2 % cream, , Disp: , Rfl:    levothyroxine (SYNTHROID, LEVOTHROID) 50 MCG tablet, take 1 tablet by mouth once daily, Disp: 90 tablet, Rfl: 0   metoprolol succinate (TOPROL-XL) 25 MG 24 hr tablet, TAKE 1 TABLET(25 MG) BY MOUTH DAILY, Disp: 90 tablet, Rfl: 3   montelukast (SINGULAIR) 10 MG tablet, TAKE 1 TABLET(10 MG) BY MOUTH AT BEDTIME, Disp: 90 tablet, Rfl: 0   rosuvastatin (CRESTOR) 40 MG tablet, TAKE 1 TABLET(40 MG) BY MOUTH DAILY, Disp: 90 tablet, Rfl: 3   topiramate (TOPAMAX) 50 MG tablet, Take 50 mg by mouth daily., Disp: , Rfl:    triamcinolone cream (KENALOG) 0.1 %, Apply 1 application topically daily., Disp: 80 g, Rfl: 2   Vitamin D, Ergocalciferol, (DRISDOL) 1.25 MG (50000 UNIT) CAPS capsule, Take 50,000 Units by mouth every 7 (  seven) days. Pt takes on Wednesday of each week, Disp: , Rfl:    Allergies  Allergen Reactions   Bee Venom Anaphylaxis    Yellow jackets Yellow jackets   Penicillins Hives    Has patient had a PCN reaction causing immediate rash, facial/tongue/throat swelling, SOB or lightheadedness with hypotension: Yes Has patient had a PCN reaction causing severe rash involving mucus membranes or skin necrosis: No Has patient had a PCN reaction that required hospitalization No Has patient had a PCN reaction occurring within the last 10 years: No If all of the above answers are "NO", then may proceed with Cephalosporin use.     Past Medical History:  Diagnosis Date    Arthritis    Asthma    Diabetes mellitus without complication ([place])    Diabetes II   DVT (deep venous thrombosis) ([place])    "Years ago"   Dyslipidemia    Eczema    GERD (gastroesophageal reflux disease)    Headache    Migraine   Hypertension    Hypothyroidism    LBBB (left bundle branch block)    MGUS (monoclonal gammopathy of unknown significance)    MGUS (monoclonal gammopathy of unknown significance)    Migraine    OSA on CPAP      Past Surgical History:  Procedure Laterality Date   ABDOMINAL HYSTERECTOMY  1969   ARTERY BIOPSY N/A 11/07/2014   Procedure: BIOPSY TEMPORAL ARTERY;  Surgeon: Leta Baptist, MD;  Location: [facility];  Service: ENT;  Laterality: N/A;   CARPAL TUNNEL RELEASE Right    CHOLECYSTECTOMY  1976   ENDOVENOUS ABLATION SAPHENOUS VEIN W/ LASER Left 08/24/2020   endovenous laser ablation left greater saphenous vein by Gae Gallop MD   LUMBAR LAMINECTOMY/DECOMPRESSION MICRODISCECTOMY N/A 07/31/2015   Procedure: L4-5 Decompression, Possible Right L4-5 Microdiscectomy;  Surgeon: Marybelle Killings, MD;  Location: [facility];  Service: Orthopedics;  Laterality: N/A;   NM MYOVIEW LTD  02/13/2010   No ischemia   PLANTAR FASCIA RELEASE     TONSILLECTOMY  1965   US ECHOCARDIOGRAPHY  09/20/2008   borderline LVH,mild TR,AOV mildly sclerotic w/ca+ of the leaflets    Family History  Problem Relation Age of Onset   Diabetes Father    Heart failure Father    Hypertension Father    Thyroid disease Sister    Hypertension Sister     Social History   Tobacco Use   Smoking status: Former    Types: Cigarettes    Quit date: 02/04/2006    Years since quitting: 14.6   Smokeless tobacco: Never  Vaping Use   Vaping Use: Never used  Substance Use Topics   Alcohol use: No    Alcohol/week: 0.0 standard drinks   Drug use: No    ROS   Objective:   Vitals: BP (!) 163/92 (BP Location: Left Arm)   Pulse 92   Temp 98.8 F (37.1 C) (Oral)   Resp 18   SpO2 98%   Physical  Exam Constitutional:      General: She is not in acute distress.    Appearance: Normal appearance. She is well-developed. She is not ill-appearing, toxic-appearing or diaphoretic.  HENT:     Head: Normocephalic and atraumatic.     Nose: Nose normal.     Mouth/Throat:     Mouth: Mucous membranes are moist.  Eyes:     Extraocular Movements: Extraocular movements intact.     Pupils: Pupils are equal, round, and reactive to light.  Cardiovascular:     Rate and Rhythm: Normal rate and regular rhythm.     Pulses: Normal pulses.     Heart sounds: Normal heart sounds. No murmur heard.   No friction rub. No gallop.  Pulmonary:     Effort: Pulmonary effort is normal. No respiratory distress.     Breath sounds: Normal breath sounds. No stridor. No wheezing, rhonchi or rales.  Skin:    General: Skin is warm and dry.     Findings: No rash.  Neurological:     Mental Status: She is alert and oriented to person, place, and time.  Psychiatric:        Mood and Affect: Mood normal.        Behavior: Behavior normal.        Thought Content: Thought content normal.    Assessment and Plan :   PDMP not reviewed this encounter.  1. Viral respiratory illness   2. Exposure to COVID-19 virus   3. Runny nose     Patient will be a good candidate for COVID antiviral should she test positive for COVID-19.  Should use the renal dosing of Paxlovid for her.  Otherwise recommend supportive care, regular use of albuterol inhaler. Counseled patient on potential for adverse effects with medications prescribed/recommended today, ER and return-to-clinic precautions discussed, patient verbalized understanding.    Jaynee Eagles, PA-C 10/12/20 2000

## 2020-10-12 NOTE — ED Triage Notes (Signed)
Pt c/o sore throat with covid exposure onset yesterday and runny nose

## 2020-10-12 NOTE — Discharge Instructions (Signed)
We will notify you of your COVID-19 test results as they arrive and may take between 48-72 hours.  I encourage you to sign up for MyChart if you have not already done so as this can be the easiest way for [place] to communicate results to you online or through a phone app.  Generally, we only contact you if it is a positive COVID result.  In the meantime, if you develop worsening symptoms including fever, chest pain, shortness of breath despite our current treatment plan then please report to the emergency room as this may be a sign of worsening status from possible COVID-19 infection.  Otherwise, we will manage this as a viral syndrome. For sore throat or cough try using a honey-based tea. Use 3 teaspoons of honey with juice squeezed from half lemon. Place shaved pieces of ginger into 1/2-1 cup of water and warm over stove top. Then mix the ingredients and repeat every 4 hours as needed. Please take Tylenol '500mg'$ -'650mg'$  every 6 hours for aches and pains, fevers. Hydrate very well with at least 2 liters of water. Eat light meals such as soups to replenish electrolytes and soft fruits, veggies. Start an antihistamine like Zyrtec, Allegra or Claritin for postnasal drainage, sinus congestion.

## 2020-10-13 ENCOUNTER — Other Ambulatory Visit: Payer: Self-pay | Admitting: Student

## 2020-10-14 LAB — NOVEL CORONAVIRUS, NAA: SARS-CoV-2, NAA: NOT DETECTED

## 2020-10-14 LAB — SARS-COV-2, NAA 2 DAY TAT

## 2020-10-27 ENCOUNTER — Ambulatory Visit
Admission: EM | Admit: 2020-10-27 | Discharge: 2020-10-27 | Disposition: A | Discharge status: Home / Self Care | Payer: Medicare HMO | Attending: Internal Medicine | Admitting: Internal Medicine

## 2020-10-27 ENCOUNTER — Other Ambulatory Visit: Payer: Self-pay

## 2020-10-27 ENCOUNTER — Telehealth: Payer: Self-pay

## 2020-10-27 DIAGNOSIS — J069 Acute upper respiratory infection, unspecified: Secondary | ICD-10-CM

## 2020-10-27 DIAGNOSIS — R0602 Shortness of breath: Secondary | ICD-10-CM | POA: Diagnosis not present

## 2020-10-27 DIAGNOSIS — Z20822 Contact with and (suspected) exposure to covid-19: Secondary | ICD-10-CM

## 2020-10-27 DIAGNOSIS — R062 Wheezing: Secondary | ICD-10-CM | POA: Diagnosis not present

## 2020-10-27 MED ORDER — METHYLPREDNISOLONE ACETATE 80 MG/ML IJ SUSP
80.0000 mg | Freq: Once | INTRAMUSCULAR | Status: AC
  Administered 2020-10-27: 80 mg via INTRAMUSCULAR

## 2020-10-27 NOTE — ED Provider Notes (Signed)
[facility]    CSN: [id number] Arrival date & time: 10/27/20  1225      History   Chief Complaint Chief Complaint  Patient presents with   Shortness of Breath    HPI Andrea Lowery is a 80 y.o. female.   Patient presents with shortness of breath, wheezing, nasal congestion, cough that started this morning.  Patient states that she used her albuterol inhaler prior to arrival which has shown a lot of improvement in the wheezing and shortness of breath.  Does have history of asthma.  Denies any known fevers or sick contacts.   Shortness of Breath  Past Medical History:  Diagnosis Date   Arthritis    Asthma    Diabetes mellitus without complication ([place])    Diabetes II   DVT (deep venous thrombosis) ([place])    "Years ago"   Dyslipidemia    Eczema    GERD (gastroesophageal reflux disease)    Headache    Migraine   Hypertension    Hypothyroidism    LBBB (left bundle branch block)    MGUS (monoclonal gammopathy of unknown significance)    MGUS (monoclonal gammopathy of unknown significance)    Migraine    OSA on CPAP     Patient Active Problem List   Diagnosis Date Noted   Left temporal headache 09/10/2020   Constipation 04/17/2020   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   Protrusion of lumbar intervertebral disc 09/13/2019   Seasonal and perennial allergic rhinitis 08/18/2019   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   No-show for appointment 07/07/2019   Elevated serum creatinine 06/05/2018   Hyperglycemia 06/05/2018   Arthralgia 08/02/2016   Eczema 08/02/2016   Hypokalemia 04/30/2016   Impingement syndrome of left shoulder 04/05/2016   Carotid artery disease ([place]) 01/19/2016   Dyslipidemia 01/19/2016   Acute severe exacerbation of asthma 01/10/2016   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, bilateral 11/20/2015   Noncompliance 11/20/2015   Seborrheic dermatitis of scalp 11/20/2015   Edema of extremities 11/20/2015   History  of lumbar laminectomy for spinal cord decompression 07/31/2015   Candidal skin infection 06/14/2015   Osteoporosis without current pathological fracture 05/30/2015   Chronic pain of right hip 05/02/2015   Chronic right-sided low back pain with right-sided sciatica 03/31/2015   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/07/2014   Headache 11/07/2014   Diabetes mellitus type 2, controlled ([place]) 25/36/6440   Diastolic CHF ([place]) 34/74/2595   Sacroiliac dysfunction 11/04/2014   Trochanteric bursitis of right hip 11/04/2014   Asthma 10/05/2014   GERD (gastroesophageal reflux disease) 10/05/2014   Allergic rhinitis 10/05/2014   Hypothyroidism 09/21/2014   Multinodular goiter 08/09/2014   Disorder of sacroiliac joint 07/29/2014   Spondylosis of lumbar region without myelopathy or radiculopathy 07/29/2014   Thyroid nodule 07/01/2014   Hypersomnolence 04/16/2014   Snoring 04/16/2014   Anxiety 03/18/2014   Preventive measure 01/25/2014   Difficulty sleeping 12/28/2013   Hyperlipidemia 63/87/5643   Acute diastolic CHF (congestive heart failure), NYHA class 2 ([place]) 07/29/2013   Bilateral leg edema 07/20/2013   Edema 07/20/2013   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/2013   Monoclonal paraproteinemia 05/20/2013   Essential hypertension 07/01/2012   LBBB (left bundle branch block) 07/01/2012   DOE (dyspnea on exertion) 07/01/2012   OSA on CPAP 07/01/2012   Abnormal respiratory rate 07/01/2012    Past Surgical History:  Procedure Laterality Date   ABDOMINAL HYSTERECTOMY  1969   ARTERY BIOPSY N/A 11/07/2014   Procedure: BIOPSY TEMPORAL  ARTERY;  Surgeon: Leta Baptist, MD;  Location: [facility];  Service: ENT;  Laterality: N/A;   CARPAL TUNNEL RELEASE Right    CHOLECYSTECTOMY  1976   ENDOVENOUS ABLATION SAPHENOUS VEIN W/ LASER Left 08/24/2020   endovenous laser ablation left greater saphenous vein by Gae Gallop MD   LUMBAR LAMINECTOMY/DECOMPRESSION MICRODISCECTOMY N/A 07/31/2015    Procedure: L4-5 Decompression, Possible Right L4-5 Microdiscectomy;  Surgeon: Marybelle Killings, MD;  Location: [facility];  Service: Orthopedics;  Laterality: N/A;   NM MYOVIEW LTD  02/13/2010   No ischemia   PLANTAR FASCIA RELEASE     TONSILLECTOMY  1965   US ECHOCARDIOGRAPHY  09/20/2008   borderline LVH,mild TR,AOV mildly sclerotic w/ca+ of the leaflets    OB History   No obstetric history on file.      Home Medications    Prior to Admission medications   Medication Sig Start Date End Date Taking? Authorizing Provider  albuterol (VENTOLIN HFA) 108 (90 Base) MCG/ACT inhaler INHALE 2 PUFFS INTO THE LUNGS EVERY 4 HOURS AS NEEDED FOR WHEEZING OR SHORTNESS OF BREATH 04/20/20   Kozlow, Donnamarie Poag, MD  Alcohol Swabs (B-D SINGLE USE SWABS REGULAR) PADS  01/04/20   [provider]  alendronate (FOSAMAX) 70 MG tablet Take 70 mg by mouth every Wednesday.  05/30/15   [provider]  amLODipine (NORVASC) 10 MG tablet Take 1 tablet (10 mg total) by mouth daily. 09/13/20   Sanjuan Dame, MD  budesonide-formoterol (SYMBICORT) 160-4.5 MCG/ACT inhaler INHALE 2 PUFFS BY MOUTH INTO THE LUNGS TWICE DAILY RINSE GARGLE AND SPIT AFTER USE 08/31/20   Kozlow, Donnamarie Poag, MD  carbamazepine (TEGRETOL XR) 200 MG 12 hr tablet Take 1 tablet (200 mg total) by mouth daily. 09/13/20   Sanjuan Dame, MD  cetirizine (ZYRTEC) 5 MG tablet Take 1 tablet (5 mg total) by mouth daily. 10/12/20   Jaynee Eagles, PA-C  DULoxetine (CYMBALTA) 30 MG capsule Take 1 capsule (30 mg total) by mouth daily. 09/13/20   Sanjuan Dame, MD  EPINEPHrine 0.3 mg/0.3 mL IJ SOAJ injection Use as directed for severe allergic reaction 08/18/19   Garnet Sierras, DO  esomeprazole (NEXIUM) 40 MG capsule Take 1 capsule (40 mg total) by mouth 2 (two) times daily. 11/10/18   Kozlow, Donnamarie Poag, MD  FASENRA 30 MG/ML SOSY INJECT 30MG  SUBCUTANEOUSLY EVERY 8 WEEKS 08/21/20   Kozlow, Donnamarie Poag, MD  fluticasone [facility]) 50 MCG/ACT nasal spray SHAKE LIQUID AND USE 2  SPRAYS IN [facility] NOSTRIL DAILY 05/26/20   Kozlow, Donnamarie Poag, MD  hydrALAZINE (APRESOLINE) 10 MG tablet Take 1 tablet (10 mg total) by mouth 3 (three) times daily. 09/12/20   Sanjuan Dame, MD  hydrOXYzine (ATARAX/VISTARIL) 10 MG tablet 10 mg 3 (three) times daily as needed for itching. 11/29/19   [provider]  ipratropium-albuterol (DUONEB) 0.5-2.5 (3) MG/3ML SOLN Take 3 mLs by nebulization every 6 (six) hours as needed. 05/23/20   Scot Jun, FNP  ketoconazole (NIZORAL) 2 % cream  11/29/19   [provider]  levothyroxine (SYNTHROID, LEVOTHROID) 50 MCG tablet take 1 tablet by mouth once daily 08/04/15   Renato Shin, MD  metoprolol succinate (TOPROL-XL) 25 MG 24 hr tablet TAKE 1 TABLET(25 MG) BY MOUTH DAILY 11/29/19   Hilty, Nadean Corwin, MD  montelukast (SINGULAIR) 10 MG tablet TAKE 1 TABLET(10 MG) BY MOUTH AT BEDTIME 09/25/20   Kozlow, Donnamarie Poag, MD  rosuvastatin (CRESTOR) 40 MG tablet TAKE 1 TABLET(40 MG) BY MOUTH DAILY 12/06/19  Pixie Casino, MD  topiramate (TOPAMAX) 50 MG tablet Take 50 mg by mouth daily. 07/01/18   [provider]  triamcinolone cream (KENALOG) 0.1 % Apply 1 application topically daily. 08/30/20   Lavonna Monarch, MD  Vitamin D, Ergocalciferol, (DRISDOL) 1.25 MG (50000 UNIT) CAPS capsule Take 50,000 Units by mouth every 7 (seven) days. Pt takes on Wednesday of each week    [provider]    Family History Family History  Problem Relation Age of Onset   Diabetes Father    Heart failure Father    Hypertension Father    Thyroid disease Sister    Hypertension Sister     Social History Social History   Tobacco Use   Smoking status: Former    Types: Cigarettes    Quit date: 02/04/2006    Years since quitting: 14.7   Smokeless tobacco: Never  Vaping Use   Vaping Use: Never used  Substance Use Topics   Alcohol use: No    Alcohol/week: 0.0 standard drinks   Drug use: No     Allergies   Bee venom and Penicillins   Review  of Systems Review of Systems Per HPI  Physical Exam Triage Vital Signs ED Triage Vitals  Enc Vitals Group     BP 10/27/20 1239 (!) 178/87     Pulse Rate 10/27/20 1239 84     Resp 10/27/20 1239 18     Temp 10/27/20 1239 97.8 F (36.6 C)     Temp Source 10/27/20 1239 Oral     SpO2 10/27/20 1239 97 %     Weight --      Height --      Head Circumference --      Peak Flow --      Pain Score 10/27/20 1240 0     Pain Loc --      Pain Edu? --      Excl. in [place]? --    No data found.  Updated Vital Signs BP (!) 169/80 (BP Location: Right Arm)   Pulse 84   Temp 97.8 F (36.6 C) (Oral)   Resp 18   SpO2 97%   Visual Acuity Right Eye Distance:   Left Eye Distance:   Bilateral Distance:    Right Eye Near:   Left Eye Near:    Bilateral Near:     Physical Exam Constitutional:      General: She is not in acute distress.    Appearance: Normal appearance. She is not toxic-appearing or diaphoretic.  HENT:     Head: Normocephalic and atraumatic.     Right Ear: Tympanic membrane and ear canal normal.     Left Ear: Tympanic membrane and ear canal normal.     Nose: Congestion present.     Mouth/Throat:     Mouth: Mucous membranes are moist.     Pharynx: No posterior oropharyngeal erythema.  Eyes:     Extraocular Movements: Extraocular movements intact.     Conjunctiva/sclera: Conjunctivae normal.     Pupils: Pupils are equal, round, and reactive to light.  Cardiovascular:     Rate and Rhythm: Normal rate and regular rhythm.     Pulses: Normal pulses.     Heart sounds: Normal heart sounds.  Pulmonary:     Effort: Pulmonary effort is normal. No respiratory distress.     Breath sounds: Normal breath sounds. No stridor. No wheezing or rhonchi.  Abdominal:     General: Abdomen is flat. Bowel sounds  are normal.     Palpations: Abdomen is soft.  Musculoskeletal:        General: Normal range of motion.     Cervical back: Normal range of motion.  Skin:    General: Skin is warm  and dry.  Neurological:     General: No focal deficit present.     Mental Status: She is alert and oriented to person, place, and time. Mental status is at baseline.  Psychiatric:        Mood and Affect: Mood normal.        Behavior: Behavior normal.     UC Treatments / Results  Labs (all labs ordered are listed, but only abnormal results are displayed) Labs Reviewed  NOVEL CORONAVIRUS, NAA    EKG   Radiology No results found.  Procedures Procedures (including critical care time)  Medications Ordered in UC Medications  methylPREDNISolone acetate (DEPO-MEDROL) injection 80 mg (80 mg Intramuscular Given 10/27/20 1332)    Initial Impression / Assessment and Plan / UC Course  I have reviewed the triage vital signs and the nursing notes.  Pertinent labs & imaging results that were available during my care of the patient were reviewed by me and considered in my medical decision making (see chart for details).     Patient presents with symptoms likely from a viral upper respiratory infection. Differential includes bacterial pneumonia, sinusitis, allergic rhinitis, Covid 19. Do not suspect underlying cardiopulmonary process. Symptoms seem unlikely related to ACS, CHF or COPD exacerbations, pneumonia, pneumothorax. Patient is nontoxic appearing and not in need of emergent medical intervention.  Patient is not in any respiratory distress at this time.  No wheezing noted on exam.  Patient encouraged to continue use of albuterol inhaler as needed.  Depo-Medrol 80 mg administered in urgent care to help with wheezing and shortness of breath.  Patient denies history of diabetes and no meds are listed in chart for diabetes treatment, so think that the prednisone injection is warranted due to current shortness of breath.  COVID-19 PCR is pending.  Recommended symptom control with over the counter medications that are safe with high blood pressure including Mucinex and Coricidin HBP.  Advised  patient to follow-up with PCP for further evaluation and management of hypertension due to elevated blood pressure reading in urgent care today.  No signs of hypertensive urgency at this time.  Patient was stable on discharge.  No signs of respiratory distress.  Advised patient to go to the hospital if shortness of breath returns or worsens.  Return if symptoms fail to improve in 1-2 weeks. Patient states understanding and is agreeable.  Discharged with PCP followup.  Final Clinical Impressions(s) / UC Diagnoses   Final diagnoses:  Shortness of breath  Wheezing  Encounter for laboratory testing for COVID-19 virus  Viral upper respiratory tract infection with cough     Discharge Instructions      Please continue albuterol inhaler as needed for wheezing and shortness of breath.  You were given Solu-Medrol injection today in urgent care which is a steroid to decrease inflammation in your chest.  COVID-19 test is pending.  We will call if it is positive.  If shortness of breath or wheezing worsens, please go to the hospital for further evaluation and management.     ED Prescriptions   None    PDMP not reviewed this encounter.   Odis Luster, FNP 10/27/20 1356

## 2020-10-27 NOTE — ED Triage Notes (Signed)
Pt states she woke up this morning wheezing and coughing which prompted this visit. States she is currently SOB. Does not appear to be in distress. Breathing not labored, no use of accessory muscles observed, no cyanosis observed.

## 2020-10-27 NOTE — Telephone Encounter (Signed)
Patient called and expressed that she woke up with shortness or breathe, wheezing, and coughing. She expressed that this all happened this morning when she woke up. She expressed that she started coughing yesterday but it has gotten progressively worse. I informed patient that I would make her a sick appointment to be seen in our office today. She went on to inform me that she has been doing her rescue inhaler since this morning with little to no relief. During our conversation she made the decision to go to urgent care for treatment as she needed immediate relief. I did inform patient that she should call our office once she is treated so that we could make her a follow up appointment. Patient expressed that she'd give [place] an update next week when she comes in for her injections.

## 2020-10-27 NOTE — Discharge Instructions (Addendum)
Please continue albuterol inhaler as needed for wheezing and shortness of breath.  You were given Solu-Medrol injection today in urgent care which is a steroid to decrease inflammation in your chest.  COVID-19 test is pending.  We will call if it is positive.  If shortness of breath or wheezing worsens, please go to the hospital for further evaluation and management.

## 2020-10-27 NOTE — Telephone Encounter (Signed)
Thank you :)

## 2020-10-28 LAB — NOVEL CORONAVIRUS, NAA: SARS-CoV-2, NAA: NOT DETECTED

## 2020-10-28 LAB — SARS-COV-2, NAA 2 DAY TAT

## 2020-11-02 ENCOUNTER — Other Ambulatory Visit: Payer: Self-pay

## 2020-11-02 ENCOUNTER — Ambulatory Visit (INDEPENDENT_AMBULATORY_CARE_PROVIDER_SITE_OTHER): Payer: Medicare HMO

## 2020-11-02 DIAGNOSIS — J4550 Severe persistent asthma, uncomplicated: Secondary | ICD-10-CM | POA: Diagnosis not present

## 2020-11-08 ENCOUNTER — Ambulatory Visit (INDEPENDENT_AMBULATORY_CARE_PROVIDER_SITE_OTHER): Payer: Medicare HMO | Admitting: *Deleted

## 2020-11-08 DIAGNOSIS — J309 Allergic rhinitis, unspecified: Secondary | ICD-10-CM | POA: Diagnosis not present

## 2020-11-12 ENCOUNTER — Other Ambulatory Visit: Payer: Self-pay | Admitting: Internal Medicine

## 2020-11-13 ENCOUNTER — Other Ambulatory Visit: Payer: Self-pay | Admitting: Internal Medicine

## 2020-11-20 ENCOUNTER — Ambulatory Visit (INDEPENDENT_AMBULATORY_CARE_PROVIDER_SITE_OTHER): Payer: Medicare HMO | Admitting: *Deleted

## 2020-11-20 DIAGNOSIS — J309 Allergic rhinitis, unspecified: Secondary | ICD-10-CM

## 2020-11-22 ENCOUNTER — Ambulatory Visit (INDEPENDENT_AMBULATORY_CARE_PROVIDER_SITE_OTHER): Payer: Medicare HMO | Admitting: Internal Medicine

## 2020-11-22 ENCOUNTER — Encounter: Payer: Self-pay | Admitting: Internal Medicine

## 2020-11-22 VITALS — BP 150/75 | HR 76 | Temp 98.4°F | Resp 32 | Ht 67.0 in | Wt 194.6 lb

## 2020-11-22 DIAGNOSIS — E1169 Type 2 diabetes mellitus with other specified complication: Secondary | ICD-10-CM

## 2020-11-22 DIAGNOSIS — E039 Hypothyroidism, unspecified: Secondary | ICD-10-CM

## 2020-11-22 DIAGNOSIS — I10 Essential (primary) hypertension: Secondary | ICD-10-CM | POA: Diagnosis not present

## 2020-11-22 LAB — POCT GLYCOSYLATED HEMOGLOBIN (HGB A1C): Hemoglobin A1C: 7.6 % — AB (ref 4.0–5.6)

## 2020-11-22 LAB — GLUCOSE, CAPILLARY: Glucose-Capillary: 107 mg/dL — ABNORMAL HIGH (ref 70–99)

## 2020-11-22 MED ORDER — LEVOTHYROXINE SODIUM 50 MCG PO TABS: 50.0000 ug | ORAL_TABLET | Freq: Every day | ORAL | 0 refills | Status: DC

## 2020-11-22 MED ORDER — AMLODIPINE-OLMESARTAN 5-40 MG PO TABS: 1.0000 | ORAL_TABLET | Freq: Every day | ORAL | 0 refills | Status: DC

## 2020-11-22 NOTE — Patient Instructions (Addendum)
Stop taking the amlodipine 10mg   I have sent over your new blood pressure medication that is a combination pill of Amlodipine 5mg  and olmesartan 40mg .  Continue taking the rest of your medications as prescribed  I also refilled your thyroid medication  We will go over your lab results when you return for your follow up visit   I will see you back October 31st

## 2020-11-22 NOTE — Progress Notes (Signed)
Established Patient Office Visit  Subjective:  Patient ID: Andrea Lowery, female    DOB: [birth year]  Age: 80 y.o. MRN: [id number]  CC:  Chief Complaint  Patient presents with   Establish Care    HPI FRANCETTA ILG presents for elevated blood pressure.  Patient at home blood pressure readings over the last several weeks SBP 170s to 200 and DBP 80s to 100s.  Patient reports intermittent associated headaches.  Patient denies syncope, changes in vision, lightheadedness, dizziness, chest pain, SOB, nausea, vomiting, constipation, diarrhea or lower extremity numbness and tingling.  Patient reports bilateral lower extremity edema has been occurring for the past few weeks, denies associated pain with the swelling.  Patient also reports recent weight gain.  Patient admits she has been eating poorly, she states she eats out daily and does not exercise.  Past Medical History:  Diagnosis Date   Arthritis    Asthma    Diabetes mellitus without complication ([place])    Diabetes II   DVT (deep venous thrombosis) ([place])    "Years ago"   Dyslipidemia    Eczema    GERD (gastroesophageal reflux disease)    Headache    Migraine   Hypertension    Hypothyroidism    LBBB (left bundle branch block)    MGUS (monoclonal gammopathy of unknown significance)    MGUS (monoclonal gammopathy of unknown significance)    Migraine    OSA on CPAP     Past Surgical History:  Procedure Laterality Date   ABDOMINAL HYSTERECTOMY  1969   ARTERY BIOPSY N/A 11/07/2014   Procedure: BIOPSY TEMPORAL ARTERY;  Surgeon: Leta Baptist, MD;  Location: [facility];  Service: ENT;  Laterality: N/A;   CARPAL TUNNEL RELEASE Right    CHOLECYSTECTOMY  1976   ENDOVENOUS ABLATION SAPHENOUS VEIN W/ LASER Left 08/24/2020   endovenous laser ablation left greater saphenous vein by Gae Gallop MD   LUMBAR LAMINECTOMY/DECOMPRESSION MICRODISCECTOMY N/A 07/31/2015   Procedure: L4-5 Decompression, Possible Right L4-5 Microdiscectomy;   Surgeon: Marybelle Killings, MD;  Location: [place];  Service: Orthopedics;  Laterality: N/A;   NM MYOVIEW LTD  02/13/2010   No ischemia   PLANTAR FASCIA RELEASE     TONSILLECTOMY  1965   US ECHOCARDIOGRAPHY  09/20/2008   borderline LVH,mild TR,AOV mildly sclerotic w/ca+ of the leaflets    Family History  Problem Relation Age of Onset   Diabetes Father    Heart failure Father    Hypertension Father    Thyroid disease Sister    Hypertension Sister     Social History   Socioeconomic History   Marital status: Divorced    Spouse name: Not on file   Number of children: Not on file   Years of education: Not on file   Highest education level: Not on file  Occupational History   Not on file  Tobacco Use   Smoking status: Former    Types: Cigarettes    Quit date: 02/04/2006    Years since quitting: 14.8   Smokeless tobacco: Never  Vaping Use   Vaping Use: Never used  Substance and Sexual Activity   Alcohol use: No    Alcohol/week: 0.0 standard drinks   Drug use: No   Sexual activity: Not on file  Other Topics Concern   Not on file  Social History Narrative   Lives with sister in a one story home.  Has one child.  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     Social Determinants of Health  Financial Resource Strain: Not on file  Food Insecurity: Not on file  Transportation Needs: Not on file  Physical Activity: Not on file  Stress: Not on file  Social Connections: Not on file  Intimate Partner Violence: Not on file    Outpatient Medications Prior to Visit  Medication Sig Dispense Refill   albuterol (VENTOLIN HFA) 108 (90 Base) MCG/ACT inhaler INHALE 2 PUFFS INTO THE LUNGS EVERY 4 HOURS AS NEEDED FOR WHEEZING OR SHORTNESS OF BREATH 18 g 1   Alcohol Swabs (B-D SINGLE USE SWABS REGULAR) PADS      alendronate (FOSAMAX) 70 MG tablet Take 70 mg by mouth every Wednesday.      budesonide-formoterol (SYMBICORT) 160-4.5 MCG/ACT inhaler INHALE 2 PUFFS BY MOUTH INTO THE LUNGS TWICE DAILY RINSE GARGLE AND  SPIT AFTER USE 10.2 g 0   carbamazepine (TEGRETOL XR) 200 MG 12 hr tablet Take 1 tablet (200 mg total) by mouth daily. 30 tablet 0   cetirizine (ZYRTEC) 5 MG tablet Take 1 tablet (5 mg total) by mouth daily. 30 tablet 0   DULoxetine (CYMBALTA) 30 MG capsule Take 1 capsule (30 mg total) by mouth daily. 30 capsule 0   EPINEPHrine 0.3 mg/0.3 mL IJ SOAJ injection Use as directed for severe allergic reaction 2 each 1   esomeprazole (NEXIUM) 40 MG capsule Take 1 capsule (40 mg total) by mouth 2 (two) times daily. 180 capsule 1   FASENRA 30 MG/ML SOSY INJECT 30MG SUBCUTANEOUSLY EVERY 8 WEEKS 1 mL 6   fluticasone (FLONASE) 50 MCG/ACT nasal spray SHAKE LIQUID AND USE 2 SPRAYS IN EACH NOSTRIL DAILY 48 g 0   hydrALAZINE (APRESOLINE) 10 MG tablet Take 1 tablet (10 mg total) by mouth 3 (three) times daily. 30 tablet 0   hydrOXYzine (ATARAX/VISTARIL) 10 MG tablet 10 mg 3 (three) times daily as needed for itching.     ipratropium-albuterol (DUONEB) 0.5-2.5 (3) MG/3ML SOLN Take 3 mLs by nebulization every 6 (six) hours as needed. 360 mL 0   ketoconazole (NIZORAL) 2 % cream      metoprolol succinate (TOPROL-XL) 25 MG 24 hr tablet TAKE 1 TABLET(25 MG) BY MOUTH DAILY 30 tablet 0   montelukast (SINGULAIR) 10 MG tablet TAKE 1 TABLET(10 MG) BY MOUTH AT BEDTIME 90 tablet 0   rosuvastatin (CRESTOR) 40 MG tablet TAKE 1 TABLET(40 MG) BY MOUTH DAILY 90 tablet 3   topiramate (TOPAMAX) 50 MG tablet Take 50 mg by mouth daily.     triamcinolone cream (KENALOG) 0.1 % Apply 1 application topically daily. 80 g 2   Vitamin D, Ergocalciferol, (DRISDOL) 1.25 MG (50000 UNIT) CAPS capsule Take 50,000 Units by mouth every 7 (seven) days. Pt takes on Wednesday of each week     amLODipine (NORVASC) 10 MG tablet Take 1 tablet (10 mg total) by mouth daily. 30 tablet 0   levothyroxine (SYNTHROID, LEVOTHROID) 50 MCG tablet take 1 tablet by mouth once daily 90 tablet 0   Facility-Administered Medications Prior to Visit  Medication Dose  Route Frequency Provider Last Rate Last Admin   Benralizumab SOSY 30 mg  30 mg Subcutaneous Q8 Weeks Kozlow, Donnamarie Poag, MD   30 mg at 11/02/20 1347    Allergies  Allergen Reactions   Bee Venom Anaphylaxis    Yellow jackets Yellow jackets   Penicillins Hives    Has patient had a PCN reaction causing immediate rash, facial/tongue/throat swelling, SOB or lightheadedness with hypotension: Yes Has patient had a PCN reaction causing severe rash involving mucus membranes  or skin necrosis: No Has patient had a PCN reaction that required hospitalization No Has patient had a PCN reaction occurring within the last 10 years: No If all of the above answers are "NO", then may proceed with Cephalosporin use.     ROS Review of Systems  Constitutional:  Negative for chills, fatigue and fever.  HENT:  Negative for congestion.   Eyes:  Negative for visual disturbance.  Respiratory:  Negative for shortness of breath.   Cardiovascular:  Positive for leg swelling. Negative for chest pain.  Gastrointestinal:  Negative for constipation, diarrhea, nausea and vomiting.  Genitourinary:  Negative for dysuria.  Neurological:  Positive for headaches (intermitent).     Objective:    Physical Exam Constitutional:      General: She is not in acute distress.    Appearance: She is overweight.  HENT:     Head: Normocephalic and atraumatic.  Cardiovascular:     Rate and Rhythm: Normal rate and regular rhythm.  Pulmonary:     Effort: Pulmonary effort is normal.     Breath sounds: Normal air entry. Decreased breath sounds present.  Musculoskeletal:     Right lower leg: 1+ Edema present.     Left lower leg: 1+ Edema present.  Skin:    General: Skin is warm and dry.  Neurological:     General: No focal deficit present.     Mental Status: She is alert.  Psychiatric:        Attention and Perception: Attention normal.        Mood and Affect: Mood normal.        Behavior: Behavior is cooperative.   BP (!)  150/75 (BP Location: Left Arm, Patient Position: Sitting, Cuff Size: Large)   Pulse 76   Temp 98.4 F (36.9 C) (Oral)   Resp (!) 32   Ht 5' 7"  (1.702 m)   Wt 194 lb 9.6 oz (88.3 kg)   SpO2 99% Comment: room air  BMI 30.48 kg/m  Wt Readings from Last 3 Encounters:  11/22/20 194 lb 9.6 oz (88.3 kg)  09/11/20 190 lb 4.1 oz (86.3 kg)  08/31/20 190 lb (86.2 kg)     Health Maintenance Due  Topic Date Due   COVID-19 Vaccine (1) Never done   FOOT EXAM  Never done   OPHTHALMOLOGY EXAM  Never done   Hepatitis C Screening  Never done   TETANUS/TDAP  Never done   DEXA SCAN  Never done   INFLUENZA VACCINE  09/04/2020    There are no preventive care reminders to display for this patient.  No results found for: TSH Lab Results  Component Value Date   WBC 4.8 09/11/2020   HGB 13.2 09/11/2020   HCT 38.4 09/11/2020   MCV 82.8 09/11/2020   PLT 228 09/11/2020   Lab Results  Component Value Date   NA 132 (L) 09/12/2020   K 4.2 09/12/2020   CHLORIDE 102 04/23/2016   CO2 22 09/12/2020   GLUCOSE 275 (H) 09/12/2020   BUN 13 09/12/2020   CREATININE 1.00 09/12/2020   BILITOT 0.9 09/11/2020   ALKPHOS 54 09/11/2020   AST 22 09/11/2020   ALT 21 09/11/2020   PROT 7.0 09/11/2020   ALBUMIN 3.4 (L) 09/11/2020   CALCIUM 9.2 09/12/2020   ANIONGAP 8 09/12/2020   EGFR 72 (L) 04/23/2016   Lab Results  Component Value Date   CHOL 123 04/02/2016   Lab Results  Component Value Date   HDL 38 (L)  04/02/2016   Lab Results  Component Value Date   LDLCALC 54 04/02/2016   Lab Results  Component Value Date   TRIG 154 (H) 04/02/2016   Lab Results  Component Value Date   CHOLHDL 3.2 04/02/2016   Lab Results  Component Value Date   HGBA1C 7.6 (A) 11/22/2020      Assessment & Plan:   Essential hypertension Patient has been hypertensive on medication.  Current medication includes amlodipine 10 mg, hydralazine 10 mg, metoprolol 25 mg.  Patient states she is compliant with her  medications.  At home blood pressure readings elevated with SBP >170 and DBP > 80 most days, patient reports.  In the past (April 2022), patient has been on chlorthalidone and HCTZ at the same time and both were discontinued due to hypokalemia.  Since then patient had been hypertensive.  PLAN: Continue hydralazine 10 mg and metoprolol 25 mg daily Started on amlodipine 5 mg-olmesartan 40 mg daily CMP ordered Follow-up in 12 to reassess blood pressure   Bilateral lower extremity edema Patient has history of laser ablation of the left great saphenous vein due to chronic venous insufficiency June 2022.  Since then patient has been experiencing intermittent lower extremity edema.  Bilateral venous Doppler conducted in July 2022 shows no evidence of DVT bilaterally.  Patient is on amlodipine 10 mg daily, common side effect is lower extremity edema.  Patient has history of lipoma around bilateral ankles that causes swelling.  Patient wears compression socks with moderate relief.  PLAN: Decrease amlodipine to 5 mg daily Continue use of compression socks   T2DM (non-insulin use) Last A1c on file was 5 years ago hemoglobin A1c was 6.7.  Patient denies use of diabetic medications in the past such as metformin.  Patient admits to poor diet and recent weight gain.  Patient denies urinary changes, vision changes, numbness or tingling of the extremities.  PLAN: A1c obtained today   Hypothyroidism Patient has been on Synthroid 50 mcg daily.  Patient states that her last hospital admission in September 2022 Synthroid medication was held.  Patient has been without medication for the last several weeks.  Patient reports recent weight gain.  Patient denies cold/hot intolerance or any other acute complaints.  PLAN: Continue Synthroid 50 mcg daily; prescription sent to pharmacy Check TSH  Healthcare maintenance Patient received flu shot today.       Problem List Items Addressed This Visit        Cardiovascular and Mediastinum   Essential hypertension   Relevant Medications   amLODipine-olmesartan (AZOR) 5-40 MG tablet   Other Relevant Orders   CMP14 + Anion Gap     Endocrine   Diabetes mellitus type 2, controlled ([place])   Relevant Medications   amLODipine-olmesartan (AZOR) 5-40 MG tablet   Other Relevant Orders   POC Hbg A1C (Completed)   Hypothyroidism - Primary   Relevant Medications   levothyroxine (SYNTHROID) 50 MCG tablet   Other Relevant Orders   TSH    Meds ordered this encounter  Medications   amLODipine-olmesartan (AZOR) 5-40 MG tablet    Sig: Take 1 tablet by mouth daily.    Dispense:  90 tablet    Refill:  0   levothyroxine (SYNTHROID) 50 MCG tablet    Sig: Take 1 tablet (50 mcg total) by mouth daily.    Dispense:  90 tablet    Refill:  0    Patient needs appointment for further refills.    Follow-up: Return in about 12 days (  around 12/04/2020) for Repeat BP .    Timothy Lasso, MD

## 2020-11-23 LAB — CMP14 + ANION GAP
ALT: 21 IU/L (ref 0–32)
AST: 21 IU/L (ref 0–40)
Albumin/Globulin Ratio: 1.4 (ref 1.2–2.2)
Albumin: 4.4 g/dL (ref 3.7–4.7)
Alkaline Phosphatase: 78 IU/L (ref 44–121)
Anion Gap: 16 mmol/L (ref 10.0–18.0)
BUN/Creatinine Ratio: 9 — ABNORMAL LOW (ref 12–28)
BUN: 7 mg/dL — ABNORMAL LOW (ref 8–27)
Bilirubin Total: 0.4 mg/dL (ref 0.0–1.2)
CO2: 23 mmol/L (ref 20–29)
Calcium: 9.4 mg/dL (ref 8.7–10.3)
Chloride: 102 mmol/L (ref 96–106)
Creatinine, Ser: 0.76 mg/dL (ref 0.57–1.00)
Globulin, Total: 3.1 g/dL (ref 1.5–4.5)
Glucose: 105 mg/dL — ABNORMAL HIGH (ref 70–99)
Potassium: 4.1 mmol/L (ref 3.5–5.2)
Sodium: 141 mmol/L (ref 134–144)
Total Protein: 7.5 g/dL (ref 6.0–8.5)
eGFR: 80 mL/min/{1.73_m2} (ref >59)

## 2020-11-23 LAB — TSH: TSH: 3.03 u[IU]/mL (ref 0.450–4.500)

## 2020-11-25 ENCOUNTER — Other Ambulatory Visit: Payer: Self-pay | Admitting: Internal Medicine

## 2020-11-28 NOTE — Progress Notes (Signed)
Internal Medicine Clinic Attending  I saw and evaluated the patient.  I personally confirmed the key portions of the history and exam documented by Dr. Ariwodo and I reviewed pertinent patient test results.  The assessment, diagnosis, and plan were formulated together and I agree with the documentation in the resident's note.   

## 2020-11-29 ENCOUNTER — Ambulatory Visit (INDEPENDENT_AMBULATORY_CARE_PROVIDER_SITE_OTHER): Payer: Medicare HMO

## 2020-11-29 DIAGNOSIS — J309 Allergic rhinitis, unspecified: Secondary | ICD-10-CM | POA: Diagnosis not present

## 2020-12-04 ENCOUNTER — Encounter: Payer: Self-pay | Admitting: Internal Medicine

## 2020-12-04 ENCOUNTER — Other Ambulatory Visit: Payer: Self-pay

## 2020-12-04 ENCOUNTER — Ambulatory Visit (INDEPENDENT_AMBULATORY_CARE_PROVIDER_SITE_OTHER): Payer: Medicare HMO | Admitting: Internal Medicine

## 2020-12-04 VITALS — BP 156/71 | HR 69 | Temp 98.4°F | Resp 28 | Ht 67.0 in | Wt 197.0 lb

## 2020-12-04 DIAGNOSIS — E1169 Type 2 diabetes mellitus with other specified complication: Secondary | ICD-10-CM

## 2020-12-04 DIAGNOSIS — I10 Essential (primary) hypertension: Secondary | ICD-10-CM | POA: Diagnosis not present

## 2020-12-04 DIAGNOSIS — R600 Localized edema: Secondary | ICD-10-CM

## 2020-12-04 DIAGNOSIS — G4733 Obstructive sleep apnea (adult) (pediatric): Secondary | ICD-10-CM | POA: Diagnosis not present

## 2020-12-04 DIAGNOSIS — Z9989 Dependence on other enabling machines and devices: Secondary | ICD-10-CM

## 2020-12-04 DIAGNOSIS — E8770 Fluid overload, unspecified: Secondary | ICD-10-CM | POA: Diagnosis not present

## 2020-12-04 DIAGNOSIS — E039 Hypothyroidism, unspecified: Secondary | ICD-10-CM

## 2020-12-04 DIAGNOSIS — J069 Acute upper respiratory infection, unspecified: Secondary | ICD-10-CM

## 2020-12-04 LAB — BRAIN NATRIURETIC PEPTIDE: B Natriuretic Peptide: 28.2 pg/mL (ref 0.0–100.0)

## 2020-12-04 MED ORDER — HYDROCHLOROTHIAZIDE 12.5 MG PO CAPS: 12.5000 mg | ORAL_CAPSULE | Freq: Every day | ORAL | 0 refills | Status: DC

## 2020-12-04 NOTE — Assessment & Plan Note (Addendum)
Patient states that for the past 3 days she has been experiencing sore throat, myalgias, subjective fevers, dry cough, SOB and wheezing.  Patient states increased frequency use of her albuterol inhaler and nebulizer.  Patient reports subjective fevers during the night.  Patient admits to being around sick contacts; states her granddaughter has the flu and was around her over the weekend.  On physical exam wheezing noted on lung auscultation.  Vitals in office, patient was afebrile.  Patient denies headache, lightheadedness, dizziness, syncope, or chest pain   PLAN: Encourage symptomatic relief such as Tylenol, hydration, inhalers as needed. Patient advised to call office if symptoms worsen COVID and flu swab collected today

## 2020-12-04 NOTE — Patient Instructions (Signed)
Hydrochlorothiazide 12.5mg  daily to help with blood pressure and leg swelling. You can pick it up at your pharmacy.   Continue taking your other blood pressure medications   Continue to drink lots of water, take tylenol and you can buy over the counter lozenges for your cough and sore throat.   If your condition worsen, please don't hesitate to call the office   I will call you with your COVID and flu swab results

## 2020-12-04 NOTE — Assessment & Plan Note (Signed)
On previous visit patient remained hypertensive despite medication therapy.  Patient was then started on amlodipine 5-olmesartan 40 mg combination pill.  This was in addition to her hydralazine 10 mg and metoprolol 25 mg.  HCTZ was discontinued in the past due to hypokalemia.  Patient is here today for BP check and patient's BP is still not at goal at 156/71.  Patient states she is compliant with her medications.  Patient endorses a salty diet and is still making efforts to try to improve.  Last CMP was reassuring; potassium was 4.1.   PLAN: Continue hydralazine 10 mg; metformin 25 mg; amlodipine 5-olmesartan 40 Add HCTZ 12.5 for 30 days Reassess in 2 weeks

## 2020-12-04 NOTE — Assessment & Plan Note (Addendum)
Patient still experiencing lower extremity edema.  On physical exam +1 edema noted bilaterally.  However this is since improved from prior visit (2+ edema).  Patient does wear compression socks and states improvement with wearing them.   PLAN: Continue to use compression socks Started on HCTZ 12.5 BNP ordered

## 2020-12-04 NOTE — Assessment & Plan Note (Signed)
TSH within normal limits; continue Synthroid 50 mcg daily

## 2020-12-04 NOTE — Assessment & Plan Note (Signed)
Patient's current A1c is 7.6%.  Patient endorses poor diet and admits to recent weight gain.  Patient was counseled extensively on the importance of proper diet and exercise.  Patient acknowledges and agrees.    PLAN: Advised to modify diet and exercise

## 2020-12-04 NOTE — Progress Notes (Addendum)
CC: BP check, flu-like syx, a1c check  HPI:  Andrea Lowery is a 80 y.o. female with a past medical history stated below and presents today for cc listed above. Please see problem based assessment and plan for additional details.  Past Medical History:  Diagnosis Date   Arthritis    Asthma    Diabetes mellitus without complication ([place])    Diabetes II   DVT (deep venous thrombosis) ([facility])    "Years ago"   Dyslipidemia    Eczema    GERD (gastroesophageal reflux disease)    Headache    Migraine   Hypertension    Hypothyroidism    LBBB (left bundle branch block)    MGUS (monoclonal gammopathy of unknown significance)    MGUS (monoclonal gammopathy of unknown significance)    Migraine    OSA on CPAP     Current Outpatient Medications on File Prior to Visit  Medication Sig Dispense Refill   albuterol (VENTOLIN HFA) 108 (90 Base) MCG/ACT inhaler INHALE 2 PUFFS INTO THE LUNGS EVERY 4 HOURS AS NEEDED FOR WHEEZING OR SHORTNESS OF BREATH 18 g 1   Alcohol Swabs (B-D SINGLE USE SWABS REGULAR) PADS      alendronate (FOSAMAX) 70 MG tablet Take 70 mg by mouth every Wednesday.      amLODipine-olmesartan (AZOR) 5-40 MG tablet Take 1 tablet by mouth daily. 90 tablet 0   budesonide-formoterol (SYMBICORT) 160-4.5 MCG/ACT inhaler INHALE 2 PUFFS BY MOUTH INTO THE LUNGS TWICE DAILY RINSE GARGLE AND SPIT AFTER USE 10.2 g 0   carbamazepine (TEGRETOL XR) 200 MG 12 hr tablet Take 1 tablet (200 mg total) by mouth daily. 30 tablet 0   cetirizine (ZYRTEC) 5 MG tablet Take 1 tablet (5 mg total) by mouth daily. 30 tablet 0   DULoxetine (CYMBALTA) 30 MG capsule Take 1 capsule (30 mg total) by mouth daily. 30 capsule 0   EPINEPHrine 0.3 mg/0.3 mL IJ SOAJ injection Use as directed for severe allergic reaction 2 each 1   esomeprazole (NEXIUM) 40 MG capsule Take 1 capsule (40 mg total) by mouth 2 (two) times daily. 180 capsule 1   FASENRA 30 MG/ML SOSY INJECT 30MG  SUBCUTANEOUSLY EVERY 8 WEEKS 1 mL 6    fluticasone (FLONASE) 50 MCG/ACT nasal spray SHAKE LIQUID AND USE 2 SPRAYS IN EACH NOSTRIL DAILY 48 g 0   hydrALAZINE (APRESOLINE) 10 MG tablet Take 1 tablet (10 mg total) by mouth 3 (three) times daily. 30 tablet 0   hydrOXYzine (ATARAX/VISTARIL) 10 MG tablet 10 mg 3 (three) times daily as needed for itching.     ipratropium-albuterol (DUONEB) 0.5-2.5 (3) MG/3ML SOLN Take 3 mLs by nebulization every 6 (six) hours as needed. 360 mL 0   ketoconazole (NIZORAL) 2 % cream      levothyroxine (SYNTHROID) 50 MCG tablet Take 1 tablet (50 mcg total) by mouth daily. 90 tablet 0   metoprolol succinate (TOPROL-XL) 25 MG 24 hr tablet TAKE 1 TABLET(25 MG) BY MOUTH DAILY 30 tablet 0   montelukast (SINGULAIR) 10 MG tablet TAKE 1 TABLET(10 MG) BY MOUTH AT BEDTIME 90 tablet 0   rosuvastatin (CRESTOR) 40 MG tablet TAKE 1 TABLET(40 MG) BY MOUTH DAILY 90 tablet 0   topiramate (TOPAMAX) 50 MG tablet Take 50 mg by mouth daily.     triamcinolone cream (KENALOG) 0.1 % Apply 1 application topically daily. 80 g 2   Vitamin D, Ergocalciferol, (DRISDOL) 1.25 MG (50000 UNIT) CAPS capsule Take 50,000 Units by mouth every 7 (  seven) days. Pt takes on Wednesday of each week     Current Facility-Administered Medications on File Prior to Visit  Medication Dose Route Frequency Provider Last Rate Last Admin   Benralizumab SOSY 30 mg  30 mg Subcutaneous Q8 Weeks Kozlow, Donnamarie Poag, MD   30 mg at 11/02/20 1347    Family History  Problem Relation Age of Onset   Diabetes Father    Heart failure Father    Hypertension Father    Thyroid disease Sister    Hypertension Sister     Social History   Socioeconomic History   Marital status: Divorced    Spouse name: Not on file   Number of children: Not on file   Years of education: Not on file   Highest education level: Not on file  Occupational History   Not on file  Tobacco Use   Smoking status: Former    Types: Cigarettes    Quit date: 02/04/2006    Years since quitting: 14.8    Smokeless tobacco: Never  Vaping Use   Vaping Use: Never used  Substance and Sexual Activity   Alcohol use: No    Alcohol/week: 0.0 standard drinks   Drug use: No   Sexual activity: Not on file  Other Topics Concern   Not on file  Social History Narrative   Lives with sister in a one story home.  Has one child.  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     Social Determinants of Health   Financial Resource Strain: Not on file  Food Insecurity: Not on file  Transportation Needs: Not on file  Physical Activity: Not on file  Stress: Not on file  Social Connections: Not on file  Intimate Partner Violence: Not on file    Review of Systems: ROS negative except for what is noted on the assessment and plan.  Vitals:   12/04/20 1333  BP: (!) 156/71  Pulse: 69  Resp: (!) 28  Temp: 98.4 F (36.9 C)  TempSrc: Oral  SpO2: 98%  Weight: 197 lb (89.4 kg)  Height: 5\' 7"  (1.702 m)     Physical Exam: Constitutional: well-appearing obese woman sitting in the chair, in no acute distress HENT: normocephalic atraumatic, mucous membranes moist Eyes: conjunctiva non-erythematous Neck: supple Cardiovascular: regular rate and rhythm, no m/r/g Pulmonary/Chest: increased work of breathing on room air, wheezing noted upper lung lobes Abdominal: soft, non-tender, non-distended MSK: normal bulk and tone Neurological: alert & oriented x 3, 5/5 strength in bilateral upper and lower extremities, normal gait Skin: warm and dry Psych: normal behavior   Assessment & Plan:   See Encounters Tab for problem based charting.  Patient seen with Dr. Jadene Pierini, M.D. [facility], PGY-1 Pager: [phone], Phone: [phone] Date 12/04/2020 Time 4:51 PM

## 2020-12-05 LAB — COVID-19, FLU A+B NAA
Influenza A, NAA: NOT DETECTED
Influenza B, NAA: NOT DETECTED
SARS-CoV-2, NAA: NOT DETECTED

## 2020-12-06 ENCOUNTER — Ambulatory Visit: Payer: Medicare HMO

## 2020-12-07 ENCOUNTER — Other Ambulatory Visit: Payer: Self-pay

## 2020-12-07 ENCOUNTER — Ambulatory Visit (INDEPENDENT_AMBULATORY_CARE_PROVIDER_SITE_OTHER): Payer: Medicare HMO | Admitting: *Deleted

## 2020-12-07 DIAGNOSIS — T63441D Toxic effect of venom of bees, accidental (unintentional), subsequent encounter: Secondary | ICD-10-CM

## 2020-12-10 ENCOUNTER — Other Ambulatory Visit: Payer: Self-pay | Admitting: Internal Medicine

## 2020-12-11 ENCOUNTER — Other Ambulatory Visit: Payer: Self-pay | Admitting: Internal Medicine

## 2020-12-12 ENCOUNTER — Ambulatory Visit (INDEPENDENT_AMBULATORY_CARE_PROVIDER_SITE_OTHER): Payer: Medicare HMO | Admitting: *Deleted

## 2020-12-12 DIAGNOSIS — J309 Allergic rhinitis, unspecified: Secondary | ICD-10-CM | POA: Diagnosis not present

## 2020-12-13 NOTE — Progress Notes (Signed)
Internal Medicine Clinic Attending  I saw and evaluated the patient.  I personally confirmed the key portions of the history and exam documented by Dr. Ariwodo and I reviewed pertinent patient test results.  The assessment, diagnosis, and plan were formulated together and I agree with the documentation in the resident's note.   

## 2020-12-21 ENCOUNTER — Ambulatory Visit (INDEPENDENT_AMBULATORY_CARE_PROVIDER_SITE_OTHER): Payer: Medicare HMO | Admitting: *Deleted

## 2020-12-21 ENCOUNTER — Other Ambulatory Visit: Payer: Self-pay | Admitting: *Deleted

## 2020-12-21 DIAGNOSIS — J309 Allergic rhinitis, unspecified: Secondary | ICD-10-CM

## 2020-12-21 MED ORDER — ALBUTEROL SULFATE HFA 108 (90 BASE) MCG/ACT IN AERS: 2.0000 | INHALATION_SPRAY | RESPIRATORY_TRACT | 1 refills | Status: DC | PRN

## 2020-12-25 ENCOUNTER — Ambulatory Visit (INDEPENDENT_AMBULATORY_CARE_PROVIDER_SITE_OTHER): Payer: Medicare HMO | Admitting: *Deleted

## 2020-12-25 ENCOUNTER — Other Ambulatory Visit: Payer: Self-pay

## 2020-12-25 ENCOUNTER — Ambulatory Visit: Payer: Medicare HMO

## 2020-12-25 ENCOUNTER — Other Ambulatory Visit: Payer: Self-pay | Admitting: Allergy and Immunology

## 2020-12-25 DIAGNOSIS — J4550 Severe persistent asthma, uncomplicated: Secondary | ICD-10-CM

## 2021-01-17 ENCOUNTER — Encounter: Payer: Self-pay | Admitting: Student

## 2021-01-17 ENCOUNTER — Other Ambulatory Visit: Payer: Self-pay | Admitting: Internal Medicine

## 2021-01-17 ENCOUNTER — Ambulatory Visit (INDEPENDENT_AMBULATORY_CARE_PROVIDER_SITE_OTHER): Payer: Medicare HMO | Admitting: Student

## 2021-01-17 VITALS — BP 188/90 | HR 73 | Wt 197.0 lb

## 2021-01-17 DIAGNOSIS — L309 Dermatitis, unspecified: Secondary | ICD-10-CM | POA: Diagnosis not present

## 2021-01-17 DIAGNOSIS — E1169 Type 2 diabetes mellitus with other specified complication: Secondary | ICD-10-CM

## 2021-01-17 DIAGNOSIS — I10 Essential (primary) hypertension: Secondary | ICD-10-CM

## 2021-01-17 MED ORDER — METOPROLOL SUCCINATE ER 25 MG PO TB24: 25.0000 mg | ORAL_TABLET | Freq: Every day | ORAL | 2 refills | Status: DC

## 2021-01-17 MED ORDER — HYDROCHLOROTHIAZIDE 12.5 MG PO CAPS: 12.5000 mg | ORAL_CAPSULE | Freq: Every day | ORAL | 2 refills | Status: DC

## 2021-01-17 NOTE — Progress Notes (Signed)
° °  CC: High blood pressure, pruritus  HPI:  [name] is a 80 y.o. with past medical history of hypertension, type 2 diabetes, who presents to the clinic today for follow-up on her blood pressure and also evaluation of skin pruritus.  Please see problem based charting for detail  Past Medical History:  Diagnosis Date   Arthritis    Asthma    Diabetes mellitus without complication ([place])    Diabetes II   DVT (deep venous thrombosis) ([place])    "Years ago"   Dyslipidemia    Eczema    GERD (gastroesophageal reflux disease)    Headache    Migraine   Hypertension    Hypothyroidism    LBBB (left bundle branch block)    MGUS (monoclonal gammopathy of unknown significance)    MGUS (monoclonal gammopathy of unknown significance)    Migraine    OSA on CPAP    Review of Systems:  per HPI  Physical Exam:  Vitals:   01/17/21 1522  BP: (!) 188/90  Pulse: 73  SpO2: 100%  Weight: 197 lb (89.4 kg)   Physical Exam HENT:     Head: Normocephalic.  Eyes:     General:        Right eye: No discharge.        Left eye: No discharge.     Conjunctiva/sclera: Conjunctivae normal.  Cardiovascular:     Rate and Rhythm: Normal rate and regular rhythm.     Heart sounds: Normal heart sounds.  Pulmonary:     Effort: Pulmonary effort is normal. No respiratory distress.     Breath sounds: Normal breath sounds. No wheezing.  Musculoskeletal:        General: Normal range of motion.  Skin:    General: Skin is warm.     Comments: Erythema of the skin around her neck and elbows likely from scratching.  No rash seen.  Neurological:     General: No focal deficit present.     Mental Status: She is alert and oriented to person, place, and time.  Psychiatric:        Mood and Affect: Mood normal.        Behavior: Behavior normal.     Assessment & Plan:   See Encounters Tab for problem based charting.  Patient discussed with Dr. Jimmye Norman

## 2021-01-17 NOTE — Assessment & Plan Note (Signed)
Patient reports skin pruritus around her neck and her arms.  This has been going on for 1 week.  She denies any new detergent or body products.  Denies new clothes.  Skin exam was nonrevealing.  I only see erythema of the skin around her neck and her have elbow from scratching.  No rash was seen.  Differential including dry skin versus contact dermatitis.  No evidence of eczema.  -Advised patient to obtain emollient over-the-counter

## 2021-01-17 NOTE — Assessment & Plan Note (Addendum)
Blood pressure significantly elevated at 188/90.  She reports adherence to hydralazine and amlodipine/olmesartan.  She was originally taking hydralazine 10 mg 4 times daily and switched to 3 times daily recently.  She ran out of her metoprolol a few weeks ago.  She does not remember if she is taking HCTZ.  Per chart review, looks like she did not fill her HCTZ.  She picked up a 90-day supply of metoprolol in July and no refill since then.  -Will resume HCTZ 12.5 mg -Refill metoprolol 25 mg daily -Continue hydralazine and amlodipine/olmesartan -Hopefully we can get her off of hydralazine -If not controlled on 4 medications, consider work-up for secondary hypertension -Follow-up in 2 weeks for blood pressure recheck and BMP

## 2021-01-17 NOTE — Patient Instructions (Addendum)
Andrea Lowery,   It was a pleasure seeing you in the clinic today.  Here is a summary what we talked about:  1.  High blood pressure: I sent a refill of your HCTZ and metoprolol.  Please start taking them every day.   2. Type 2 diabetes: A1c 7.6.  Please continue to work on diet and exercise.  We will talk about medication and next visit if needed.  3.  Itching: Please obtain a moisturizer over-the-counter such as Lubriderm.   Return in 2 weeks for blood pressure recheck   Take care,  Dr. Alfonse Spruce

## 2021-01-17 NOTE — Assessment & Plan Note (Signed)
A1c of 7.6 in October.  She is not on any medications.  Said that she she is trying diet and exercise but not going very well.    She said that she was given samples of Jardiance in the past and was doing well.  She does not like metformin because of diarrhea side effects.  -However given her polypharmacy, will hold off on starting medication at this time.  Advised patient on continuing diet and exercise if able.  We will recheck A1c in 3 months.  If trending up, can consider starting Jardiance.

## 2021-01-22 ENCOUNTER — Encounter: Payer: Medicare HMO | Admitting: Internal Medicine

## 2021-01-22 NOTE — Progress Notes (Signed)
Internal Medicine Clinic Attending  Case discussed with Dr. Nguyen  At the time of the visit.  We reviewed the resident's history and exam and pertinent patient test results.  I agree with the assessment, diagnosis, and plan of care documented in the resident's note. 

## 2021-01-26 ENCOUNTER — Encounter: Payer: Self-pay | Admitting: Emergency Medicine

## 2021-01-26 ENCOUNTER — Ambulatory Visit
Admission: EM | Admit: 2021-01-26 | Discharge: 2021-01-26 | Disposition: A | Discharge status: Home / Self Care | Payer: Medicare HMO | Attending: Physician Assistant | Admitting: Physician Assistant

## 2021-01-26 ENCOUNTER — Other Ambulatory Visit: Payer: Self-pay

## 2021-01-26 DIAGNOSIS — J069 Acute upper respiratory infection, unspecified: Secondary | ICD-10-CM

## 2021-01-26 NOTE — ED Provider Notes (Signed)
[name]    CSN: [id number] Arrival date & time: 01/26/21  1741      History   Chief Complaint Chief Complaint  Patient presents with   Cough    HPI Andrea Lowery is a 80 y.o. female.   Patient here today for evaluation of 2-day history of cough and congestion.  She states that her sister was recently sick and then she developed symptoms.  She would like COVID screening.  She denies any fever and has not had any new body aches.  The history is provided by the patient.  Cough Associated symptoms: sore throat   Associated symptoms: no chills, no ear pain, no eye discharge, no fever, no shortness of breath and no wheezing    Past Medical History:  Diagnosis Date   Arthritis    Asthma    Diabetes mellitus without complication ([place])    Diabetes II   DVT (deep venous thrombosis) ([place])    "Years ago"   Dyslipidemia    Eczema    GERD (gastroesophageal reflux disease)    Headache    Migraine   Hypertension    Hypothyroidism    LBBB (left bundle branch block)    MGUS (monoclonal gammopathy of unknown significance)    MGUS (monoclonal gammopathy of unknown significance)    Migraine    OSA on CPAP     Patient Active Problem List   Diagnosis Date Noted   Upper respiratory infection 12/04/2020   Protrusion of lumbar intervertebral disc 09/13/2019   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   Eczema 08/02/2016   Hypokalemia 04/30/2016   Acute severe exacerbation of asthma 01/10/2016   Seborrheic dermatitis of scalp 11/20/2015   Osteoporosis without current pathological fracture 05/30/2015   Chronic right-sided low back pain with right-sided sciatica 03/31/2015   Temporal arteritis ([place]) 11/07/2014   Type 2 diabetes mellitus ([place]) 86/76/7209   Diastolic CHF ([place]) 47/10/6281   Sacroiliac dysfunction 11/04/2014   GERD (gastroesophageal reflux disease) 10/05/2014   Hypothyroidism 09/21/2014   Anxiety 03/18/2014   Difficulty sleeping 12/28/2013    Hyperlipidemia 12/27/2013   Bilateral leg edema 07/20/2013   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/2013   Monoclonal paraproteinemia 05/20/2013   Essential hypertension 07/01/2012   DOE (dyspnea on exertion) 07/01/2012   OSA on CPAP 07/01/2012    Past Surgical History:  Procedure Laterality Date   ABDOMINAL HYSTERECTOMY  1969   ARTERY BIOPSY N/A 11/07/2014   Procedure: BIOPSY TEMPORAL ARTERY;  Surgeon: Leta Baptist, MD;  Location: [facility];  Service: ENT;  Laterality: N/A;   CARPAL TUNNEL RELEASE Right    CHOLECYSTECTOMY  1976   ENDOVENOUS ABLATION SAPHENOUS VEIN W/ LASER Left 08/24/2020   endovenous laser ablation left greater saphenous vein by Gae Gallop MD   LUMBAR LAMINECTOMY/DECOMPRESSION MICRODISCECTOMY N/A 07/31/2015   Procedure: L4-5 Decompression, Possible Right L4-5 Microdiscectomy;  Surgeon: Marybelle Killings, MD;  Location: [facility];  Service: Orthopedics;  Laterality: N/A;   NM MYOVIEW LTD  02/13/2010   No ischemia   PLANTAR FASCIA RELEASE     TONSILLECTOMY  1965   US ECHOCARDIOGRAPHY  09/20/2008   borderline LVH,mild TR,AOV mildly sclerotic w/ca+ of the leaflets    OB History   No obstetric history on file.      Home Medications    Prior to Admission medications   Medication Sig Start Date End Date Taking? Authorizing Provider  albuterol (VENTOLIN HFA) 108 (90 Base) MCG/ACT inhaler Inhale 2 puffs into the lungs every 4 (  four) hours as needed for wheezing or shortness of breath. 12/21/20   Kozlow, Donnamarie Poag, MD  Alcohol Swabs (B-D SINGLE USE SWABS REGULAR) PADS  01/04/20   [provider]  alendronate (FOSAMAX) 70 MG tablet Take 70 mg by mouth every Wednesday.  05/30/15   [provider]  amLODipine-olmesartan (AZOR) 5-40 MG tablet Take 1 tablet by mouth daily. 11/22/20 02/20/21  Timothy Lasso, MD  budesonide-formoterol (SYMBICORT) 160-4.5 MCG/ACT inhaler INHALE 2 PUFFS BY MOUTH INTO THE LUNGS TWICE DAILY RINSE GARGLE AND SPIT AFTER USE  08/31/20   Kozlow, Donnamarie Poag, MD  carbamazepine (TEGRETOL XR) 200 MG 12 hr tablet Take 1 tablet (200 mg total) by mouth daily. 09/13/20   Sanjuan Dame, MD  cetirizine (ZYRTEC) 5 MG tablet Take 1 tablet (5 mg total) by mouth daily. 10/12/20   Jaynee Eagles, PA-C  DULoxetine (CYMBALTA) 30 MG capsule Take 1 capsule (30 mg total) by mouth daily. 09/13/20   Sanjuan Dame, MD  EPINEPHrine 0.3 mg/0.3 mL IJ SOAJ injection Use as directed for severe allergic reaction 08/18/19   Garnet Sierras, DO  esomeprazole (NEXIUM) 40 MG capsule Take 1 capsule (40 mg total) by mouth 2 (two) times daily. 11/10/18   Kozlow, Donnamarie Poag, MD  FASENRA 30 MG/ML SOSY INJECT 30MG  SUBCUTANEOUSLY EVERY 8 WEEKS 08/21/20   Kozlow, Donnamarie Poag, MD  fluticasone [facility]) 50 MCG/ACT nasal spray SHAKE LIQUID AND USE 2 SPRAYS IN [employer] [facility] NOSTRIL DAILY 05/26/20   Kozlow, Donnamarie Poag, MD  hydrALAZINE (APRESOLINE) 10 MG tablet Take 1 tablet (10 mg total) by mouth 3 (three) times daily. 09/12/20   Sanjuan Dame, MD  hydrochlorothiazide (MICROZIDE) 12.5 MG capsule Take 1 capsule (12.5 mg total) by mouth daily. 01/17/21 04/17/21  Gaylan Gerold, DO  hydrOXYzine (ATARAX/VISTARIL) 10 MG tablet 10 mg 3 (three) times daily as needed for itching. 11/29/19   [provider]  ipratropium-albuterol (DUONEB) 0.5-2.5 (3) MG/3ML SOLN Take 3 mLs by nebulization every 6 (six) hours as needed. 05/23/20   Scot Jun, FNP  ketoconazole (NIZORAL) 2 % cream  11/29/19   [provider]  levothyroxine (SYNTHROID) 50 MCG tablet Take 1 tablet (50 mcg total) by mouth daily. 11/22/20 02/20/21  Timothy Lasso, MD  metoprolol succinate (TOPROL-XL) 25 MG 24 hr tablet Take 1 tablet (25 mg total) by mouth daily. PATIENT MUST SCHEDULE APPOINTMENT FOR FUTURE REFILLS. SECOND ATTEMPT. 01/17/21 04/17/21  Gaylan Gerold, DO  montelukast (SINGULAIR) 10 MG tablet TAKE 1 TABLET(10 MG) BY MOUTH AT BEDTIME 09/25/20   Kozlow, Donnamarie Poag, MD  rosuvastatin (CRESTOR) 40 MG tablet TAKE 1 TABLET(40  MG) BY MOUTH DAILY 11/27/20   Hilty, Nadean Corwin, MD  topiramate (TOPAMAX) 50 MG tablet Take 50 mg by mouth daily. 07/01/18   [provider]  triamcinolone cream (KENALOG) 0.1 % Apply 1 application topically daily. 08/30/20   Lavonna Monarch, MD  Vitamin D, Ergocalciferol, (DRISDOL) 1.25 MG (50000 UNIT) CAPS capsule Take 50,000 Units by mouth every 7 (seven) days. Pt takes on Wednesday of each week    [provider]    Family History Family History  Problem Relation Age of Onset   Diabetes Father    Heart failure Father    Hypertension Father    Thyroid disease Sister    Hypertension Sister     Social History Social History   Tobacco Use   Smoking status: Former    Types: Cigarettes    Quit date: 02/04/2006    Years since quitting: 80.9  Smokeless tobacco: Never  Vaping Use   Vaping Use: Never used  Substance Use Topics   Alcohol use: No    Alcohol/week: 0.0 standard drinks   Drug use: No     Allergies   Bee venom and Penicillins   Review of Systems Review of Systems  Constitutional:  Negative for chills and fever.  HENT:  Positive for congestion and sore throat. Negative for ear pain.   Eyes:  Negative for discharge and redness.  Respiratory:  Positive for cough. Negative for shortness of breath and wheezing.   Gastrointestinal:  Negative for abdominal pain, diarrhea, nausea and vomiting.    Physical Exam Triage Vital Signs ED Triage Vitals  Enc Vitals Group     BP 01/26/21 1800 138/75     Pulse Rate 01/26/21 1800 68     Resp 01/26/21 1800 16     Temp 01/26/21 1800 98.3 F (36.8 C)     Temp Source 01/26/21 1800 Oral     SpO2 01/26/21 1800 97 %     Weight --      Height --      Head Circumference --      Peak Flow --      Pain Score 01/26/21 1801 5     Pain Loc --      Pain Edu? --      Excl. in [place]? --    No data found.  Updated Vital Signs BP 138/75 (BP Location: Left Arm)    Pulse 68    Temp 98.3 F (36.8 C) (Oral)    Resp 16     SpO2 97%   Physical Exam Vitals and nursing note reviewed.  Constitutional:      General: She is not in acute distress.    Appearance: Normal appearance. She is not ill-appearing.  HENT:     Head: Normocephalic and atraumatic.     Nose: Congestion present.     Mouth/Throat:     Mouth: Mucous membranes are moist.     Pharynx: No oropharyngeal exudate or posterior oropharyngeal erythema.  Eyes:     Conjunctiva/sclera: Conjunctivae normal.  Cardiovascular:     Rate and Rhythm: Normal rate and regular rhythm.     Heart sounds: Normal heart sounds. No murmur heard. Pulmonary:     Effort: Pulmonary effort is normal. No respiratory distress.     Breath sounds: Normal breath sounds. No wheezing, rhonchi or rales.  Skin:    General: Skin is warm and dry.  Neurological:     Mental Status: She is alert.  Psychiatric:        Mood and Affect: Mood normal.        Thought Content: Thought content normal.     UC Treatments / Results  Labs (all labs ordered are listed, but only abnormal results are displayed) Labs Reviewed  COVID-19, FLU A+B NAA    EKG   Radiology No results found.  Procedures Procedures (including critical care time)  Medications Ordered in UC Medications - No data to display  Initial Impression / Assessment and Plan / UC Course  I have reviewed the triage vital signs and the nursing notes.  Pertinent labs & imaging results that were available during my care of the patient were reviewed by me and considered in my medical decision making (see chart for details).  Suspect viral etiology of symptoms.  Will order COVID and flu screening.  Will await results further recommendation.  Encouraged follow-up with any further concerns.  Final Clinical Impressions(s) / UC Diagnoses   Final diagnoses:  Upper respiratory tract infection, unspecified type   Discharge Instructions   None    ED Prescriptions   None    PDMP not reviewed this encounter.   Francene Finders, PA-C 01/26/21 [phone]

## 2021-01-26 NOTE — ED Triage Notes (Signed)
Sister came to stay with her, got sick, then patient got sick. Has been coughing over the last two days. Here for covid test

## 2021-01-27 LAB — COVID-19, FLU A+B NAA
Influenza A, NAA: NOT DETECTED
Influenza B, NAA: NOT DETECTED
SARS-CoV-2, NAA: NOT DETECTED

## 2021-01-31 ENCOUNTER — Other Ambulatory Visit: Payer: Self-pay | Admitting: Internal Medicine

## 2021-01-31 ENCOUNTER — Encounter: Payer: Medicare HMO | Admitting: Internal Medicine

## 2021-01-31 DIAGNOSIS — I10 Essential (primary) hypertension: Secondary | ICD-10-CM

## 2021-02-01 ENCOUNTER — Ambulatory Visit: Payer: Medicare HMO

## 2021-02-01 ENCOUNTER — Ambulatory Visit: Payer: Medicare HMO | Admitting: Family Medicine

## 2021-02-06 ENCOUNTER — Other Ambulatory Visit: Payer: Self-pay | Admitting: *Deleted

## 2021-02-06 ENCOUNTER — Other Ambulatory Visit: Payer: Self-pay

## 2021-02-06 ENCOUNTER — Telehealth: Payer: Self-pay | Admitting: Allergy and Immunology

## 2021-02-06 ENCOUNTER — Encounter: Payer: Self-pay | Admitting: Allergy and Immunology

## 2021-02-06 ENCOUNTER — Ambulatory Visit (INDEPENDENT_AMBULATORY_CARE_PROVIDER_SITE_OTHER): Payer: Medicare HMO | Admitting: Allergy and Immunology

## 2021-02-06 VITALS — BP 140/84 | HR 85 | Temp 97.2°F | Resp 18 | Ht 67.0 in | Wt 192.0 lb

## 2021-02-06 DIAGNOSIS — J4551 Severe persistent asthma with (acute) exacerbation: Secondary | ICD-10-CM | POA: Diagnosis not present

## 2021-02-06 DIAGNOSIS — J302 Other seasonal allergic rhinitis: Secondary | ICD-10-CM

## 2021-02-06 DIAGNOSIS — T63441D Toxic effect of venom of bees, accidental (unintentional), subsequent encounter: Secondary | ICD-10-CM

## 2021-02-06 DIAGNOSIS — K219 Gastro-esophageal reflux disease without esophagitis: Secondary | ICD-10-CM

## 2021-02-06 DIAGNOSIS — J3089 Other allergic rhinitis: Secondary | ICD-10-CM

## 2021-02-06 DIAGNOSIS — J069 Acute upper respiratory infection, unspecified: Secondary | ICD-10-CM

## 2021-02-06 MED ORDER — ESOMEPRAZOLE MAGNESIUM 40 MG PO CPDR: 40.0000 mg | DELAYED_RELEASE_CAPSULE | Freq: Two times a day (BID) | ORAL | 1 refills | Status: DC

## 2021-02-06 MED ORDER — FASENRA 30 MG/ML ~~LOC~~ SOSY: 30.0000 mg | PREFILLED_SYRINGE | SUBCUTANEOUS | 6 refills | Status: DC

## 2021-02-06 MED ORDER — METHYLPREDNISOLONE ACETATE 80 MG/ML IJ SUSP
80.0000 mg | Freq: Once | INTRAMUSCULAR | Status: AC
  Administered 2021-02-06: 80 mg via INTRAMUSCULAR

## 2021-02-06 MED ORDER — EPINEPHRINE 0.3 MG/0.3ML IJ SOAJ: INTRAMUSCULAR | 1 refills | Status: DC

## 2021-02-06 MED ORDER — FLUTICASONE PROPIONATE 50 MCG/ACT NA SUSP: 2.0000 | Freq: Every day | NASAL | 1 refills | Status: DC

## 2021-02-06 MED ORDER — HYDROXYZINE HCL 10 MG PO TABS: 10.0000 mg | ORAL_TABLET | Freq: Three times a day (TID) | ORAL | 1 refills | Status: DC | PRN

## 2021-02-06 MED ORDER — MONTELUKAST SODIUM 10 MG PO TABS: 10.0000 mg | ORAL_TABLET | Freq: Every evening | ORAL | 1 refills | Status: DC

## 2021-02-06 NOTE — Patient Instructions (Addendum)
°  1. Continue Fluticasone 1-2 sprays each nostril two times per day  2. Continue montelukast 10mg  one tablet one time per day  3. Continue esomeprazole 40 mg - 1 tablet 1 time per day  4. Continue immunotherapy for venoms and aeroallergens  5. Continue benralizumab injections every 8 weeks  6. If Needed:     A. nasal saline multiple times per day   B. OTC mucinex DM twice a day   C. Cetirizine 10mg  one time per day  D. Proair HFA 2 inhalations or DUONEB every 4-6 hours  E. EpiPen  7. For this recent episode:   A. Depomedrol 80 mg IM delivered in clinic today  B. Start sample of Alvesco - 1 inhalations 2 times per day (empty lungs)  C.  DuoNeb administered in clinic.  8. Return to clinic in 6 months or earlier if problem

## 2021-02-06 NOTE — Telephone Encounter (Signed)
Patient called and said that she meds for the itching has not been called in yet.

## 2021-02-06 NOTE — Telephone Encounter (Signed)
Please advise to sending in refill of hydroxyzine as it was not listed on avs for pts visit today

## 2021-02-06 NOTE — Progress Notes (Signed)
[facility]   Follow-up Note  Referring Provider: Timothy Lasso, MD Primary Provider: Timothy Lasso, MD Date of Office Visit: 02/06/2021  Subjective:   Andrea Lowery (DOB: 06/28/1940) is a 81 y.o. female who returns to the [facility] on 02/06/2021 in re-evaluation of the following:  HPI: Andrea Lowery presents to this clinic in evaluation of asthma, allergic rhinitis, LPR, and hymenoptera venom allergy.  Her last visit to this clinic with me was 08 February 2020.  Over the course of the past 4 days she has been coughing like crazy.  She lives with her sister who presently has COVID.  She was swab tested for COVID prior to the onset of her cough and she came up with a negative result.  In association with her cough she has nasal congestion and ear fullness.  She does not have any other associated symptoms.  She has been immunized with 4 COVID vaccines and has received this year's flu vaccine.  Prior to this event she was really doing quite well while using a combination of anti-inflammatory agents for her airway including the use of benralizumab injections, immunotherapy, and montelukast on a consistent basis.  Rarely did she use a short acting bronchodilator averaging out about 1 time every month.  She did not require a systemic steroid or antibiotic for any type of airway issue.  She is receiving immunotherapy for hymenoptera venom allergy and for aeroallergen hypersensitivity both of which are going quite well without any adverse effect.  She believes that her reflux is under excellent control.  Allergies as of 02/06/2021       Reactions   Bee Venom Anaphylaxis   Yellow jackets Yellow jackets   Penicillins Hives   Has patient had a PCN reaction causing immediate rash, facial/tongue/throat swelling, SOB or lightheadedness with hypotension: Yes Has patient had a PCN reaction causing severe rash involving mucus membranes or  skin necrosis: No Has patient had a PCN reaction that required hospitalization No Has patient had a PCN reaction occurring within the last 10 years: No If all of the above answers are "NO", then may proceed with Cephalosporin use.        Medication List    albuterol 108 (90 Base) MCG/ACT inhaler Commonly known as: VENTOLIN HFA Inhale 2 puffs into the lungs every 4 (four) hours as needed for wheezing or shortness of breath.   alendronate 70 MG tablet Commonly known as: FOSAMAX Take 70 mg by mouth every Wednesday.   amLODipine-olmesartan 5-40 MG tablet Commonly known as: AZOR Take 1 tablet by mouth daily.   B-D SINGLE USE SWABS REGULAR Pads   budesonide-formoterol 160-4.5 MCG/ACT inhaler Commonly known as: Symbicort INHALE 2 PUFFS BY MOUTH INTO THE LUNGS TWICE DAILY RINSE GARGLE AND SPIT AFTER USE   carbamazepine 200 MG 12 hr tablet Commonly known as: TEGRETOL XR Take 1 tablet (200 mg total) by mouth daily.   cetirizine 5 MG tablet Commonly known as: ZYRTEC Take 1 tablet (5 mg total) by mouth daily.   DULoxetine 30 MG capsule Commonly known as: CYMBALTA Take 1 capsule (30 mg total) by mouth daily.   EPINEPHrine 0.3 mg/0.3 mL Soaj injection Commonly known as: EPI-PEN Use as directed for severe allergic reaction   esomeprazole 40 MG capsule Commonly known as: NEXIUM Take 1 capsule (40 mg total) by mouth 2 (two) times daily.   Fasenra 30 MG/ML Sosy Generic drug: Benralizumab INJECT 30MG  SUBCUTANEOUSLY EVERY 8 WEEKS  fluticasone 50 MCG/ACT nasal spray Commonly known as: FLONASE SHAKE LIQUID AND USE 2 SPRAYS IN EACH NOSTRIL DAILY   hydrALAZINE 10 MG tablet Commonly known as: APRESOLINE Take 1 tablet (10 mg total) by mouth 3 (three) times daily.   hydrochlorothiazide 12.5 MG capsule Commonly known as: Microzide Take 1 capsule (12.5 mg total) by mouth daily.   hydrOXYzine 10 MG tablet Commonly known as: ATARAX 10 mg 3 (three) times daily as needed for  itching.   ipratropium-albuterol 0.5-2.5 (3) MG/3ML Soln Commonly known as: DUONEB Take 3 mLs by nebulization every 6 (six) hours as needed.   ketoconazole 2 % cream Commonly known as: NIZORAL   levothyroxine 50 MCG tablet Commonly known as: SYNTHROID Take 1 tablet (50 mcg total) by mouth daily.   metoprolol succinate 25 MG 24 hr tablet Commonly known as: TOPROL-XL Take 1 tablet (25 mg total) by mouth daily. PATIENT MUST SCHEDULE APPOINTMENT FOR FUTURE REFILLS. SECOND ATTEMPT.   montelukast 10 MG tablet Commonly known as: SINGULAIR TAKE 1 TABLET(10 MG) BY MOUTH AT BEDTIME   rosuvastatin 40 MG tablet Commonly known as: CRESTOR TAKE 1 TABLET(40 MG) BY MOUTH DAILY   topiramate 50 MG tablet Commonly known as: TOPAMAX Take 50 mg by mouth daily.   triamcinolone cream 0.1 % Commonly known as: KENALOG Apply 1 application topically daily.   Vitamin D (Ergocalciferol) 1.25 MG (50000 UNIT) Caps capsule Commonly known as: DRISDOL Take 50,000 Units by mouth every 7 (seven) days. Pt takes on Wednesday of each week    Past Medical History:  Diagnosis Date   Arthritis    Asthma    Diabetes mellitus without complication ([place])    Diabetes II   DVT (deep venous thrombosis) ([place])    "Years ago"   Dyslipidemia    Eczema    GERD (gastroesophageal reflux disease)    Headache    Migraine   Hypertension    Hypothyroidism    LBBB (left bundle branch block)    MGUS (monoclonal gammopathy of unknown significance)    MGUS (monoclonal gammopathy of unknown significance)    Migraine    OSA on CPAP     Past Surgical History:  Procedure Laterality Date   ABDOMINAL HYSTERECTOMY  1969   ARTERY BIOPSY N/A 11/07/2014   Procedure: BIOPSY TEMPORAL ARTERY;  Surgeon: Leta Baptist, MD;  Location: [facility];  Service: ENT;  Laterality: N/A;   CARPAL TUNNEL RELEASE Right    CHOLECYSTECTOMY  1976   ENDOVENOUS ABLATION SAPHENOUS VEIN W/ LASER Left 08/24/2020   endovenous laser ablation left greater  saphenous vein by Gae Gallop MD   LUMBAR LAMINECTOMY/DECOMPRESSION MICRODISCECTOMY N/A 07/31/2015   Procedure: L4-5 Decompression, Possible Right L4-5 Microdiscectomy;  Surgeon: Marybelle Killings, MD;  Location: [facility];  Service: Orthopedics;  Laterality: N/A;   NM MYOVIEW LTD  02/13/2010   No ischemia   PLANTAR FASCIA RELEASE     TONSILLECTOMY  1965   US ECHOCARDIOGRAPHY  09/20/2008   borderline LVH,mild TR,AOV mildly sclerotic w/ca+ of the leaflets    Review of systems negative except as noted in HPI / PMHx or noted below:  Review of Systems  Constitutional: Negative.   HENT: Negative.    Eyes: Negative.   Respiratory: Negative.    Cardiovascular: Negative.   Gastrointestinal: Negative.   Genitourinary: Negative.   Musculoskeletal: Negative.   Skin: Negative.   Neurological: Negative.   Endo/Heme/Allergies: Negative.   Psychiatric/Behavioral: Negative.      Objective:   Vitals:   02/06/21  1023  BP: 140/84  Pulse: 85  Resp: 18  Temp: (!) 97.2 F (36.2 C)  SpO2: 94%   Height: 5\' 7"  (170.2 cm)  Weight: 192 lb (87.1 kg)   Physical Exam Constitutional:      Appearance: She is not diaphoretic.  HENT:     Head: Normocephalic.     Right Ear: Tympanic membrane, ear canal and external ear normal.     Left Ear: Tympanic membrane, ear canal and external ear normal.     Nose: Nose normal. No mucosal edema or rhinorrhea.     Mouth/Throat:     Pharynx: Uvula midline. No oropharyngeal exudate.  Eyes:     Conjunctiva/sclera: Conjunctivae normal.  Neck:     Thyroid: No thyromegaly.     Trachea: Trachea normal. No tracheal tenderness or tracheal deviation.  Cardiovascular:     Rate and Rhythm: Normal rate and regular rhythm.     Heart sounds: Normal heart sounds, S1 normal and S2 normal. No murmur heard. Pulmonary:     Effort: No respiratory distress.     Breath sounds: No stridor. Wheezing (Bilateral expiratory wheezes all lung fields) present. No rales.  Lymphadenopathy:      Head:     Right side of head: No tonsillar adenopathy.     Left side of head: No tonsillar adenopathy.     Cervical: No cervical adenopathy.  Skin:    Findings: No erythema or rash.     Nails: There is no clubbing.  Neurological:     Mental Status: She is alert.    Diagnostics:    Spirometry was performed and demonstrated an FEV1 of 1.01 at 54 % of predicted.  BIANAX NOW COVID SWAB negative  Assessment and Plan:   1. Severe persistent asthma with acute exacerbation   2. Seasonal and perennial allergic rhinitis   3. LPRD (laryngopharyngeal reflux disease)   4. Toxic effect of venom of bees, unintentional, subsequent encounter   5. Viral upper respiratory tract infection with cough     1. Continue Fluticasone 1-2 sprays each nostril two times per day  2. Continue montelukast 10mg  one tablet one time per day  3. Continue esomeprazole 40 mg - 1 tablet 1 time per day  4. Continue immunotherapy for venoms and aeroallergens  5. Continue benralizumab injections every 8 weeks  6. If Needed:     A. nasal saline multiple times per day   B. OTC mucinex DM twice a day   C. Cetirizine 10mg  one time per day  D. Proair HFA 2 inhalations or DUONEB every 4-6 hours  E. EpiPen  7. For this recent episode:   A. Depomedrol 80 mg IM delivered in clinic today  B. Start sample of Alvesco - 1 inhalations 2 times per day (empty lungs)  C.  DuoNeb administered in clinic.  8. Return to clinic in 6 months or earlier if problem  Andrea Lowery has some type of viral respiratory tract infection and I have given her a systemic steroid and inhaled steroid to utilize to help her get over this event.  For the most part she has really done well while using benralizumab as her major controller agent for her atopic eosinophilic driven respiratory tract disease and we will keep her on this agent and also continue to have her use immunotherapy direct against aeroallergens and venoms.  She can continue on nasal  fluticasone and montelukast as well.  Her reflux appears to be under very good control while using her  Nexium 1 time per day at this point.  Assuming she does well with the plan noted above I will see her back in this clinic in 6 months or earlier if there is a problem.  Allena Katz, MD Allergy / Immunology [place]

## 2021-02-06 NOTE — Telephone Encounter (Signed)
Informed pt of sending in new rx confirmed pts pharmacy

## 2021-02-07 ENCOUNTER — Encounter: Payer: Self-pay | Admitting: Allergy and Immunology

## 2021-02-07 ENCOUNTER — Other Ambulatory Visit: Payer: Self-pay | Admitting: *Deleted

## 2021-02-07 MED ORDER — FASENRA PEN 30 MG/ML ~~LOC~~ SOAJ: 30.0000 mg | SUBCUTANEOUS | 6 refills | Status: DC

## 2021-02-08 ENCOUNTER — Ambulatory Visit (INDEPENDENT_AMBULATORY_CARE_PROVIDER_SITE_OTHER): Payer: Medicare HMO | Admitting: *Deleted

## 2021-02-08 DIAGNOSIS — J309 Allergic rhinitis, unspecified: Secondary | ICD-10-CM | POA: Diagnosis not present

## 2021-02-12 ENCOUNTER — Ambulatory Visit (INDEPENDENT_AMBULATORY_CARE_PROVIDER_SITE_OTHER): Payer: Medicare HMO | Admitting: Student

## 2021-02-12 ENCOUNTER — Other Ambulatory Visit: Payer: Self-pay

## 2021-02-12 ENCOUNTER — Encounter: Payer: Self-pay | Admitting: Student

## 2021-02-12 VITALS — BP 153/62 | HR 76 | Temp 98.0°F | Ht 67.0 in | Wt 192.9 lb

## 2021-02-12 DIAGNOSIS — E1169 Type 2 diabetes mellitus with other specified complication: Secondary | ICD-10-CM | POA: Diagnosis not present

## 2021-02-12 DIAGNOSIS — Z9989 Dependence on other enabling machines and devices: Secondary | ICD-10-CM

## 2021-02-12 DIAGNOSIS — I10 Essential (primary) hypertension: Secondary | ICD-10-CM

## 2021-02-12 DIAGNOSIS — G4733 Obstructive sleep apnea (adult) (pediatric): Secondary | ICD-10-CM | POA: Diagnosis not present

## 2021-02-12 LAB — POCT GLYCOSYLATED HEMOGLOBIN (HGB A1C): Hemoglobin A1C: 7.5 % — AB (ref 4.0–5.6)

## 2021-02-12 LAB — GLUCOSE, CAPILLARY: Glucose-Capillary: 108 mg/dL — ABNORMAL HIGH (ref 70–99)

## 2021-02-12 MED ORDER — AMLODIPINE-OLMESARTAN 10-40 MG PO TABS: 1.0000 | ORAL_TABLET | Freq: Every day | ORAL | 3 refills | Status: DC

## 2021-02-12 MED ORDER — EMPAGLIFLOZIN 10 MG PO TABS: 10.0000 mg | ORAL_TABLET | Freq: Every day | ORAL | 2 refills | Status: AC

## 2021-02-12 MED ORDER — EMPAGLIFLOZIN 10 MG PO TABS: 10.0000 mg | ORAL_TABLET | Freq: Every day | ORAL | 2 refills | Status: DC

## 2021-02-12 NOTE — Progress Notes (Signed)
75

## 2021-02-12 NOTE — Patient Instructions (Addendum)
It was a pleasure seeing you in clinic. Today we discussed:   Blood pressure: Please increase your amlodipine-olmesartan to 10-40 mg daily, continue metoprolol, and hctz, and hydralazine  Diabetes: Please start Jardiance for diabetes  I have placed a referral for a repeat sleep study  I will call you about your labwork  Follow up in 1 month for BP recheck, please bring all your medications to your next visit  If you have any questions or concerns, please call our clinic at [phone] between 9am-5pm and after hours call [phone] and ask for the internal medicine resident on call. If you feel you are having a medical emergency please call 911.   Thank you, we look forward to helping you remain healthy!

## 2021-02-13 ENCOUNTER — Other Ambulatory Visit: Payer: Self-pay | Admitting: *Deleted

## 2021-02-13 ENCOUNTER — Telehealth: Payer: Self-pay | Admitting: Allergy and Immunology

## 2021-02-13 DIAGNOSIS — J069 Acute upper respiratory infection, unspecified: Secondary | ICD-10-CM

## 2021-02-13 LAB — BMP8+ANION GAP
Anion Gap: 13 mmol/L (ref 10.0–18.0)
BUN/Creatinine Ratio: 6 — ABNORMAL LOW (ref 12–28)
BUN: 4 mg/dL — ABNORMAL LOW (ref 8–27)
CO2: 25 mmol/L (ref 20–29)
Calcium: 9.2 mg/dL (ref 8.7–10.3)
Chloride: 104 mmol/L (ref 96–106)
Creatinine, Ser: 0.69 mg/dL (ref 0.57–1.00)
Glucose: 111 mg/dL — ABNORMAL HIGH (ref 70–99)
Potassium: 3.5 mmol/L (ref 3.5–5.2)
Sodium: 142 mmol/L (ref 134–144)
eGFR: 89 mL/min/{1.73_m2} (ref >59)

## 2021-02-13 MED ORDER — PREDNISONE 10 MG PO TABS: 20.0000 mg | ORAL_TABLET | Freq: Every day | ORAL | 0 refills | Status: DC

## 2021-02-13 MED ORDER — AZITHROMYCIN 500 MG PO TABS: 500.0000 mg | ORAL_TABLET | Freq: Every day | ORAL | 0 refills | Status: DC

## 2021-02-13 NOTE — Progress Notes (Signed)
° °  CC: BP follow up  HPI:  [name] is a 81 y.o. female with past medical history listed below presents for follow up of elevated blood pressure. Please refer to problem based charting for further details and assessment and plan of current problem and chronic medical conditions.   Past Medical History:  Diagnosis Date   Arthritis    Asthma    Diabetes mellitus without complication ([place])    Diabetes II   DVT (deep venous thrombosis) ([place])    "Years ago"   Dyslipidemia    Eczema    GERD (gastroesophageal reflux disease)    Headache    Migraine   Hypertension    Hypothyroidism    LBBB (left bundle branch block)    MGUS (monoclonal gammopathy of unknown significance)    MGUS (monoclonal gammopathy of unknown significance)    Migraine    OSA on CPAP    Review of Systems:  Negative as per HPI  Physical Exam:  Vitals:   02/12/21 1324 02/12/21 1351  BP: (!) 161/65 (!) 153/62  Pulse: 75 76  Temp: 98 F (36.7 C)   TempSrc: Oral   SpO2: 100%   Weight: 192 lb 14.4 oz (87.5 kg)   Height: 5\' 7"  (1.702 m)    Physical Exam Constitutional:      Appearance: Normal appearance. She is obese.  HENT:     Head: Normocephalic and atraumatic.     Right Ear: External ear normal.     Left Ear: External ear normal.     Mouth/Throat:     Mouth: Mucous membranes are moist.     Pharynx: Oropharynx is clear.  Eyes:     Extraocular Movements: Extraocular movements intact.     Pupils: Pupils are equal, round, and reactive to light.  Cardiovascular:     Rate and Rhythm: Normal rate and regular rhythm.     Pulses: Normal pulses.  Pulmonary:     Effort: Pulmonary effort is normal.     Breath sounds: Normal breath sounds.  Abdominal:     General: Abdomen is flat. Bowel sounds are normal.     Palpations: Abdomen is soft.  Musculoskeletal:        General: No signs of injury. Normal range of motion.  Skin:    General: Skin is warm and dry.     Capillary Refill: Capillary  refill takes less than 2 seconds.     Comments: NO wound on bilateral feet  Neurological:     General: No focal deficit present.     Mental Status: She is alert and oriented to person, place, and time.  Psychiatric:        Mood and Affect: Mood normal.        Behavior: Behavior normal.     Assessment & Plan:   See Encounters Tab for problem based charting.  Patient discussed with Dr.  Cain Sieve

## 2021-02-13 NOTE — Telephone Encounter (Signed)
Patient states she is still having trouble breathing & has had a stopped up nose along with coughing & wheezing. Patient states she would like a nebulizer as she has does not work anymore. Patient says she will pay for it even if insurance does not cover one.   Patient is requesting something be sent in as well to help with symptoms.   [name], [name], [name] [id number]  Best contact number: [phone]

## 2021-02-13 NOTE — Assessment & Plan Note (Signed)
Reports she has not used CPAP for many years I do not see documentation of prior sleep studies in our system today.  She is not quite sure when she stopped using her CPAP for her when her last sleep study was but was told after her last sleep study that she did not need to use a CPAP anymore.  Patient does have an elevated BMI of 30 and endorses snoring and daytime fatigue putting her at risk of OSA.  Sleep study referral placed

## 2021-02-13 NOTE — Telephone Encounter (Signed)
Please inform Andrea Lowery that we can give her prednisone 10 mg - 2 tablets 1 time per day for 3 days + azithromycin 500 mg - 1 tablet 1 time per day for 3 days.  Please provide her a new nebulizer as apparently her old nebulizer is broken.

## 2021-02-13 NOTE — Assessment & Plan Note (Signed)
Patient here for follow-up blood pressure.  Blood pressure elevated at 161/65 and 153/62 on repeat.  She reports compliance with all her medication.  States she is taking hydralazine, amlodipine-olmesartan, and metoprolol combination she states she did pick up her HCTZ and has started taking that as well.  On reviewing her medication list does not appear that her HCTZ was filled at [facility] although her other medications.    Her blood pressure remains elevated despite multiple medications.  She attributes this to stress from taking care of her sister who is chronically ill.  Notes her blood pressures at home are similar to readings in office today.  Prior lab work does not seem consistent with primary hyperaldosteronism.  She denies regular NSAID use does use ibuprofen maybe once or twice a month as needed for joint pains.  She denies alcohol use.  Was previously on CPAP for OSA although she reports that she was told she no longer needed CPAP several years ago.  I do see 8 sleep study ordered for 2015 but does not appear it was completed.  She does report a history of snoring and feeling easily fatigued in the daytime.  Prior renal ultrasound with nonobstructing left kidney stone without other findings.  We will repeat OSA as this may be contributing to her hypertension we will hold off on further evaluation of other secondary causes of hypertension.  Plan Increase amlodipine olmesartan to 10-40 mg daily Continue hydralazine, HCTZ BMP today with stable kidney function Sleep study ordered Follow-up in 1 month for blood pressure recheck

## 2021-02-13 NOTE — Assessment & Plan Note (Signed)
A1c remains elevated at 7.5.  She is not currently on any medications.  Did note she did well on Jardiance in the past.  Did have GI symptoms with metformin would not like to restart that.  Given consistently elevated A1c discussed that she is diabetic and would benefit from restarting her Jardiance.  Plan Jardiance 10 mg daily Foot exam today States she follows with Dr. Katy Fitch for eye care we will quest records from prior visits

## 2021-02-13 NOTE — Telephone Encounter (Signed)
Per provider, called patient - DOB verified - advised of provider's notation. Patient states previous nebulizer is 64+ years old - advised new nebulizer will be up front for her to pick up along with a form for her to sign before releasing it to her. Patient advised if previous nebulizer kit is not 17+ years old her insurance company will not pay for it and she will be charged for the kit - patient verbalized understanding and had no further questions.  Pharmacy verified - Prednisone 10 mg 2 tablets by mouth 1 time per day for 3 days and Azithromycin 500 mg 1 tablet by mouth 1 time per day for 3 days electronically sent.  Patient verbalized understanding and had no further questions at this time.

## 2021-02-13 NOTE — Telephone Encounter (Signed)
Please advise 

## 2021-02-13 NOTE — Progress Notes (Signed)
Internal Medicine Clinic Attending ? ?Case discussed with Dr. Liang  At the time of the visit.  We reviewed the resident?s history and exam and pertinent patient test results.  I agree with the assessment, diagnosis, and plan of care documented in the resident?s note. ? ?

## 2021-02-16 ENCOUNTER — Other Ambulatory Visit: Payer: Self-pay

## 2021-02-16 MED ORDER — IPRATROPIUM-ALBUTEROL 0.5-2.5 (3) MG/3ML IN SOLN: 3.0000 mL | Freq: Four times a day (QID) | RESPIRATORY_TRACT | 2 refills | Status: DC | PRN

## 2021-02-16 NOTE — Telephone Encounter (Signed)
Patient has picked up nebulizer today (1/13). She signed form and I have gave it to [employer].

## 2021-02-19 ENCOUNTER — Ambulatory Visit (INDEPENDENT_AMBULATORY_CARE_PROVIDER_SITE_OTHER): Payer: Medicare HMO

## 2021-02-19 ENCOUNTER — Other Ambulatory Visit: Payer: Self-pay

## 2021-02-19 DIAGNOSIS — J4550 Severe persistent asthma, uncomplicated: Secondary | ICD-10-CM

## 2021-03-01 ENCOUNTER — Other Ambulatory Visit: Payer: Self-pay | Admitting: Allergy and Immunology

## 2021-03-15 ENCOUNTER — Encounter: Payer: Medicare HMO | Admitting: Internal Medicine

## 2021-03-20 ENCOUNTER — Encounter: Payer: Self-pay | Admitting: Dietician

## 2021-03-21 ENCOUNTER — Ambulatory Visit (INDEPENDENT_AMBULATORY_CARE_PROVIDER_SITE_OTHER): Payer: Medicare HMO | Admitting: Internal Medicine

## 2021-03-21 ENCOUNTER — Encounter: Payer: Self-pay | Admitting: Internal Medicine

## 2021-03-21 VITALS — BP 143/71 | HR 88 | Temp 98.2°F | Wt 190.3 lb

## 2021-03-21 DIAGNOSIS — E039 Hypothyroidism, unspecified: Secondary | ICD-10-CM | POA: Diagnosis not present

## 2021-03-21 DIAGNOSIS — L309 Dermatitis, unspecified: Secondary | ICD-10-CM | POA: Diagnosis not present

## 2021-03-21 DIAGNOSIS — I10 Essential (primary) hypertension: Secondary | ICD-10-CM | POA: Diagnosis not present

## 2021-03-21 NOTE — Assessment & Plan Note (Signed)
Patient reports diffuse pruritis but most significant at bilateral elbows, left > right. She does have patch of dry skin without evidence of surrounding rash or lesions.  Plan: Advised for emollient over the counter cream  Will continue to monitor

## 2021-03-21 NOTE — Progress Notes (Signed)
° °  CC: hypertension follow up  HPI:  Andrea Lowery is a 81 y.o. female with PMHx as stated below presenting for hypertension follow up. Patient reports increased urination since starting diuretic. She notes home systolic blood pressures have improved from 180s to 140s. Please see problem based charting for complete assessment and plan.   Past Medical History:  Diagnosis Date   Arthritis    Asthma    Diabetes mellitus without complication ([place])    Diabetes II   DVT (deep venous thrombosis) ([place])    "Years ago"   Dyslipidemia    Eczema    GERD (gastroesophageal reflux disease)    Headache    Migraine   Hypertension    Hypothyroidism    LBBB (left bundle branch block)    MGUS (monoclonal gammopathy of unknown significance)    MGUS (monoclonal gammopathy of unknown significance)    Migraine    OSA on CPAP    Review of Systems:  Negative except as stated in HPI.  Physical Exam:  Vitals:   03/21/21 1312  BP: (!) 143/71  Pulse: 88  Temp: 98.2 F (36.8 C)  TempSrc: Oral  SpO2: 99%  Weight: 190 lb 4.8 oz (86.3 kg)   Physical Exam  Constitutional: Appears well-developed and well-nourished. No distress.  Cardiovascular: Normal rate, regular rhythm, S1 and S2 present. Distal pulses intact Respiratory: Effort normal on room air. Lungs are clear to auscultation bilaterally. Musculoskeletal: Normal bulk and tone.  No peripheral edema noted. Neurological: Is alert and oriented x4, no apparent focal deficits noted. Skin: Warm and dry.  Dry skin noted bilateral elbows without rash (L>R)  Psychiatric: Normal mood and affect.   Assessment & Plan:   See Encounters Tab for problem based charting.  Patient discussed with Dr. Evette Doffing

## 2021-03-21 NOTE — Patient Instructions (Addendum)
Ms Andrea Lowery,  It was a pleasure seeing you in clinic. Today we discussed:   Blood pressure: Your blood pressure looks improved today. Continue current medications.  Eyebrow thinning: We will check on your thyroid levels. I will call you with any abnormal results.   Dry skin: You may use Eucerin cream for this. Let us know if no significant improvement  If you have any questions or concerns, please call our clinic at [phone] between 9am-5pm and after hours call [phone] and ask for the internal medicine resident on call. If you feel you are having a medical emergency please call 911.   Thank you, we look forward to helping you remain healthy!

## 2021-03-21 NOTE — Assessment & Plan Note (Signed)
BP Readings from Last 3 Encounters:  03/21/21 (!) 143/71  02/12/21 (!) 153/62  02/06/21 140/84   Patient is presenting for follow up of her hypertension. She was evaluated for this last month and noted to be hypertensive to systolic 561I, repeat 548P.. At her visit, amlodipine-olmesartan was increased. She was advised to continue hydralazine and HCTZ. Patient reports increased urination since starting her thiazide. However, does report improvement in her blood pressures with systolic mostly in the 234K. BP 143/71 at this visit. She denies any headaches, vision changes, dizziness/lightheadedness or focal weakness.   Plan: Continue amlodipine-olmesartan 10-40mg  daily, hydralazine 10mg  tid, and HCTZ 12.5mg  daily F/u sleep study Follow up in 3 months for BP check and BMP

## 2021-03-21 NOTE — Assessment & Plan Note (Signed)
Patient with history of hypothyroidism on synthroid daily. She reports compliance with medication. However, has noticed thinning of her eyebrows, especially the ends. Will recheck TSH today   Plan: TSH today Continue synthroid 7mcg daily

## 2021-03-22 LAB — TSH: TSH: 3.04 u[IU]/mL (ref 0.450–4.500)

## 2021-03-22 NOTE — Progress Notes (Signed)
Internal Medicine Clinic Attending ° °Case discussed with Dr. Aslam  At the time of the visit.  We reviewed the resident’s history and exam and pertinent patient test results.  I agree with the assessment, diagnosis, and plan of care documented in the resident’s note.  °

## 2021-03-28 ENCOUNTER — Telehealth: Payer: Self-pay

## 2021-03-28 NOTE — Telephone Encounter (Signed)
Requesting lab results, please call pt back.  

## 2021-03-29 NOTE — Telephone Encounter (Signed)
RTC to patient; informed that TSH was wnl. She will follow up as needed

## 2021-04-04 ENCOUNTER — Telehealth: Payer: Self-pay

## 2021-04-04 NOTE — Telephone Encounter (Signed)
Patient called today wanting to know about her allergy injections, venom, and biologic injection.  ?I let her know she can come in any day this week to get her allergy injection and be backed down because she's late, and for her venom injection we'll have to do half wait 30 minutes and do the other half, and her Berna Bue is scheduled for 04/16/21. ? ?

## 2021-04-05 ENCOUNTER — Ambulatory Visit (INDEPENDENT_AMBULATORY_CARE_PROVIDER_SITE_OTHER): Payer: Medicare HMO | Admitting: *Deleted

## 2021-04-05 DIAGNOSIS — J309 Allergic rhinitis, unspecified: Secondary | ICD-10-CM | POA: Diagnosis not present

## 2021-04-09 ENCOUNTER — Other Ambulatory Visit: Payer: Self-pay

## 2021-04-09 ENCOUNTER — Ambulatory Visit (INDEPENDENT_AMBULATORY_CARE_PROVIDER_SITE_OTHER): Payer: Medicare HMO | Admitting: *Deleted

## 2021-04-09 DIAGNOSIS — T63441D Toxic effect of venom of bees, accidental (unintentional), subsequent encounter: Secondary | ICD-10-CM | POA: Diagnosis not present

## 2021-04-12 ENCOUNTER — Ambulatory Visit (INDEPENDENT_AMBULATORY_CARE_PROVIDER_SITE_OTHER): Payer: Medicare HMO

## 2021-04-12 DIAGNOSIS — J309 Allergic rhinitis, unspecified: Secondary | ICD-10-CM

## 2021-04-16 ENCOUNTER — Ambulatory Visit (INDEPENDENT_AMBULATORY_CARE_PROVIDER_SITE_OTHER): Payer: Medicare HMO

## 2021-04-16 ENCOUNTER — Other Ambulatory Visit: Payer: Self-pay

## 2021-04-16 DIAGNOSIS — J4550 Severe persistent asthma, uncomplicated: Secondary | ICD-10-CM | POA: Diagnosis not present

## 2021-04-19 ENCOUNTER — Ambulatory Visit (INDEPENDENT_AMBULATORY_CARE_PROVIDER_SITE_OTHER): Payer: Medicare HMO

## 2021-04-19 DIAGNOSIS — J309 Allergic rhinitis, unspecified: Secondary | ICD-10-CM | POA: Diagnosis not present

## 2021-04-26 ENCOUNTER — Ambulatory Visit (INDEPENDENT_AMBULATORY_CARE_PROVIDER_SITE_OTHER): Payer: Medicare HMO | Admitting: *Deleted

## 2021-04-26 DIAGNOSIS — J309 Allergic rhinitis, unspecified: Secondary | ICD-10-CM

## 2021-04-30 ENCOUNTER — Ambulatory Visit (INDEPENDENT_AMBULATORY_CARE_PROVIDER_SITE_OTHER): Payer: Medicare HMO

## 2021-04-30 ENCOUNTER — Encounter: Payer: Self-pay | Admitting: Neurology

## 2021-04-30 ENCOUNTER — Ambulatory Visit (INDEPENDENT_AMBULATORY_CARE_PROVIDER_SITE_OTHER): Payer: Medicare HMO | Admitting: Neurology

## 2021-04-30 VITALS — BP 157/62 | HR 71 | Ht 65.0 in | Wt 191.4 lb

## 2021-04-30 DIAGNOSIS — E669 Obesity, unspecified: Secondary | ICD-10-CM | POA: Diagnosis not present

## 2021-04-30 DIAGNOSIS — G4733 Obstructive sleep apnea (adult) (pediatric): Secondary | ICD-10-CM

## 2021-04-30 DIAGNOSIS — R0683 Snoring: Secondary | ICD-10-CM | POA: Diagnosis not present

## 2021-04-30 DIAGNOSIS — G4719 Other hypersomnia: Secondary | ICD-10-CM | POA: Diagnosis not present

## 2021-04-30 DIAGNOSIS — J309 Allergic rhinitis, unspecified: Secondary | ICD-10-CM

## 2021-04-30 DIAGNOSIS — R519 Headache, unspecified: Secondary | ICD-10-CM

## 2021-04-30 DIAGNOSIS — E66811 Obesity, class 1: Secondary | ICD-10-CM

## 2021-04-30 DIAGNOSIS — R351 Nocturia: Secondary | ICD-10-CM

## 2021-04-30 NOTE — Progress Notes (Signed)
VIALS EXP 05-01-22 ?

## 2021-04-30 NOTE — Progress Notes (Signed)
Subjective:    Patient ID: Andrea Lowery is a 81 y.o. female.  HPI    Andrea Foley, MD, PhD [facility] [address], [address] [address] [place], [place] [address]  Dear Drs. Elaina Pattee and [place],   I saw your patient, Andrea Lowery, upon your kind request in my sleep clinic today for initial consultation of her sleep disorder, in particular, evaluation of her prior diagnosis of obstructive sleep apnea.  The patient is unaccompanied today.  As you know, Andrea Lowery is a 81 year old right-handed woman with an underlying complex medical history of hypertension, hypothyroidism, left bundle branch block, DVT, hyperlipidemia, arthritis, asthma, MGUS, migraines, diabetes, and borderline obesity, who reports snoring and excessive daytime somnolence.  She was previously diagnosed with obstructive sleep apnea and placed on PAP therapy.  I reviewed your office note from 02/12/2021.  She was previously on CPAP therapy.  I was able to review a baseline sleep study report from 01/25/2014, at which time her AHI was 0.3/h, O2 nadir 91%. She is on multiple blood pressure medications.  She does not recall taking hydrochlorothiazide however.  She no longer uses a CPAP machine, she reports that she was told she no longer needs it.  She takes care of of her older sister who has dementia.  Patient's sister lives with her.  Patient has 1 son.  She reports stress, her grandson was killed 2 years ago and this has been a big source of stress and grief for her.  Bedtime is generally around 11:30 PM and rise time between 8 and 9 AM.  She has a TV in her bedroom and it tends to stay on most of the night.  Her weight has been more or less stable.  She had a tonsillectomy at age 81 or 81.  She does drink caffeine in the form of regular soda, about 2-3 bottles per day on average.  She does not currently consume any alcohol and quit smoking about 20 years ago.  She occasionally has headaches in the  mornings and has nocturia about twice per average night.  Her Past Medical History Is Significant For: Past Medical History:  Diagnosis Date   Arthritis    Asthma    Diabetes mellitus without complication (HCC)    Diabetes II   DVT (deep venous thrombosis) (HCC)    "Years ago"   Dyslipidemia    Eczema    GERD (gastroesophageal reflux disease)    Headache    Migraine   Hypertension    Hypothyroidism    LBBB (left bundle branch block)    MGUS (monoclonal gammopathy of unknown significance)    MGUS (monoclonal gammopathy of unknown significance)    Migraine    OSA on CPAP     Her Past Surgical History Is Significant For: Past Surgical History:  Procedure Laterality Date   ABDOMINAL HYSTERECTOMY  1969   ARTERY BIOPSY N/A 11/07/2014   Procedure: BIOPSY TEMPORAL ARTERY;  Surgeon: Newman Pies, MD;  Location: [facility] OR;  Service: ENT;  Laterality: N/A;   CARPAL TUNNEL RELEASE Right    CHOLECYSTECTOMY  1976   ENDOVENOUS ABLATION SAPHENOUS VEIN W/ LASER Left 08/24/2020   endovenous laser ablation left greater saphenous vein by Cari Caraway MD   LUMBAR LAMINECTOMY/DECOMPRESSION MICRODISCECTOMY N/A 07/31/2015   Procedure: L4-5 Decompression, Possible Right L4-5 Microdiscectomy;  Surgeon: Eldred Manges, MD;  Location: [facility] OR;  Service: Orthopedics;  Laterality: N/A;   NM MYOVIEW LTD  02/13/2010   No ischemia  PLANTAR FASCIA RELEASE     TONSILLECTOMY  1965   US ECHOCARDIOGRAPHY  09/20/2008   borderline LVH,mild TR,AOV mildly sclerotic w/ca+ of the leaflets    Her Family History Is Significant For: Family History  Problem Relation Age of Onset   Diabetes Father    Heart failure Father    Hypertension Father    Sleep apnea Father    Thyroid disease Sister    Hypertension Sister     Her Social History Is Significant For: Social History   Socioeconomic History   Marital status: Divorced    Spouse name: Not on file   Number of children: Not on file   Years of education: Not on  file   Highest education level: Not on file  Occupational History   Not on file  Tobacco Use   Smoking status: Former    Types: Cigarettes    Quit date: 02/04/2006    Years since quitting: 15.2   Smokeless tobacco: Never  Vaping Use   Vaping Use: Never used  Substance and Sexual Activity   Alcohol use: No    Alcohol/week: 0.0 standard drinks   Drug use: No   Sexual activity: Not on file  Other Topics Concern   Not on file  Social History Narrative   Lives with sister in a one story home.  Has one child.  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     Social Determinants of Health   Financial Resource Strain: Not on file  Food Insecurity: Not on file  Transportation Needs: Not on file  Physical Activity: Not on file  Stress: Not on file  Social Connections: Not on file    Her Allergies Are:  Allergies  Allergen Reactions   Bee Venom Anaphylaxis    Yellow jackets Yellow jackets   Penicillins Hives    Has patient had a PCN reaction causing immediate rash, facial/tongue/throat swelling, SOB or lightheadedness with hypotension: Yes Has patient had a PCN reaction causing severe rash involving mucus membranes or skin necrosis: No Has patient had a PCN reaction that required hospitalization No Has patient had a PCN reaction occurring within the last 10 years: No If all of the above answers are "NO", then may proceed with Cephalosporin use.   :   Her Current Medications Are:  Outpatient Encounter Medications as of 04/30/2021  Medication Sig   albuterol (VENTOLIN HFA) 108 (90 Base) MCG/ACT inhaler Inhale 2 puffs into the lungs every 4 (four) hours as needed for wheezing or shortness of breath.   Alcohol Swabs (B-D SINGLE USE SWABS REGULAR) PADS    alendronate (FOSAMAX) 70 MG tablet Take 70 mg by mouth every Wednesday.    amLODipine-olmesartan (AZOR) 10-40 MG tablet Take 1 tablet by mouth daily.   azithromycin (ZITHROMAX) 500 MG tablet Take 1 tablet (500 mg total) by mouth daily.   Benralizumab  (FASENRA PEN) 30 MG/ML SOAJ Inject 1 mL (30 mg total) into the skin every 8 (eight) weeks.   budesonide-formoterol (SYMBICORT) 160-4.5 MCG/ACT inhaler INHALE 2 PUFFS BY MOUTH INTO THE LUNGS TWICE DAILY RINSE GARGLE AND SPIT AFTER USE   carbamazepine (TEGRETOL XR) 200 MG 12 hr tablet Take 1 tablet (200 mg total) by mouth daily.   cetirizine (ZYRTEC) 10 MG tablet TAKE 1 TABLET BY MOUTH 1 TO 2 TIMES A DAY FOR ALLERGIES   cetirizine (ZYRTEC) 5 MG tablet Take 1 tablet (5 mg total) by mouth daily.   DULoxetine (CYMBALTA) 30 MG capsule Take 1 capsule (30 mg total) by mouth daily.  empagliflozin (JARDIANCE) 10 MG TABS tablet Take 1 tablet (10 mg total) by mouth daily before breakfast.   EPINEPHrine 0.3 mg/0.3 mL IJ SOAJ injection Use as directed for severe allergic reaction   esomeprazole (NEXIUM) 40 MG capsule Take 1 capsule (40 mg total) by mouth 2 (two) times daily.   fluticasone (FLONASE) 50 MCG/ACT nasal spray Place 2 sprays into both nostrils daily.   hydrALAZINE (APRESOLINE) 10 MG tablet Take 1 tablet (10 mg total) by mouth 3 (three) times daily.   hydrOXYzine (ATARAX) 10 MG tablet Take 1 tablet (10 mg total) by mouth 3 (three) times daily as needed for itching.   ipratropium-albuterol (DUONEB) 0.5-2.5 (3) MG/3ML SOLN Take 3 mLs by nebulization every 6 (six) hours as needed.   ketoconazole (NIZORAL) 2 % cream    montelukast (SINGULAIR) 10 MG tablet Take 1 tablet (10 mg total) by mouth at bedtime.   predniSONE (DELTASONE) 10 MG tablet Take 2 tablets (20 mg total) by mouth daily with breakfast.   rosuvastatin (CRESTOR) 40 MG tablet TAKE 1 TABLET(40 MG) BY MOUTH DAILY   topiramate (TOPAMAX) 50 MG tablet Take 50 mg by mouth daily.   triamcinolone cream (KENALOG) 0.1 % Apply 1 application topically daily.   Vitamin D, Ergocalciferol, (DRISDOL) 1.25 MG (50000 UNIT) CAPS capsule Take 50,000 Units by mouth every 7 (seven) days. Pt takes on Wednesday of each week   hydrochlorothiazide (MICROZIDE) 12.5 MG  capsule Take 1 capsule (12.5 mg total) by mouth daily.   levothyroxine (SYNTHROID) 50 MCG tablet Take 1 tablet (50 mcg total) by mouth daily.   metoprolol succinate (TOPROL-XL) 25 MG 24 hr tablet Take 1 tablet (25 mg total) by mouth daily. PATIENT MUST SCHEDULE APPOINTMENT FOR FUTURE REFILLS. SECOND ATTEMPT.   Facility-Administered Encounter Medications as of 04/30/2021  Medication   Benralizumab SOSY 30 mg  :   Review of Systems:  Out of a complete 14 point review of systems, all are reviewed and negative with the exception of these symptoms as listed below:  Review of Systems  Neurological:        Pt is here for sleep consult   Pt states she snores,has headaches , fatigue , and hypertension. Pt states she had a sleep study, and she doesn't have her CPAP machine   ESS :16 FSS :51   Objective:  Neurological Exam  Physical Exam Physical Examination:   Vitals:   04/30/21 1009  BP: (!) 157/62  Pulse: 71    General Examination: The patient is a very pleasant 81 y.o. female in no acute distress. She appears well-developed and well-nourished and well groomed.   HEENT: Normocephalic, atraumatic, pupils are equal, round and reactive to light, extraocular tracking is good without limitation to gaze excursion or nystagmus noted. Hearing is grossly intact. Face is symmetric with normal facial animation. Speech is clear with no dysarthria noted. There is no hypophonia. There is no lip, neck/head, jaw or voice tremor. Neck is supple with full range of passive and active motion. There are no carotid bruits on auscultation. Oropharynx exam reveals: mild to moderate mouth dryness, full dentures in place, moderate airway crowding secondary to redundant soft palate, Mallampati class III, tonsils absent.  Neck circumference of 15-5/8 inches.  Tongue protrudes centrally and palate elevates symmetrically.   Chest: Clear to auscultation without wheezing, rhonchi or crackles noted.  Heart: S1+S2+0,  regular and normal without murmurs, rubs or gallops noted.   Abdomen: Soft, non-tender and non-distended.  Extremities: There is 2+ pitting edema  in the distal lower extremities bilaterally.  Compression socks in place.  Skin: Warm and dry without trophic changes noted.   Musculoskeletal: exam reveals no obvious joint deformities.   Neurologically:  Mental status: The patient is awake, alert and oriented in all 4 spheres. Her immediate and remote memory, attention, language skills and fund of knowledge are appropriate. There is no evidence of aphasia, agnosia, apraxia or anomia. Speech is clear with normal prosody and enunciation. Thought process is linear. Mood is normal and affect is normal.  Cranial nerves II - XII are as described above under HEENT exam.  Motor exam: Normal bulk, strength and tone is noted. There is no obvious tremor. Fine motor skills and coordination: grossly intact.  Cerebellar testing: No dysmetria or intention tremor. There is no truncal or gait ataxia.  Sensory exam: intact to light touch in the upper and lower extremities.  Gait, station and balance: She stands easily. No veering to one side is noted. No leaning to one side is noted. Posture is age-appropriate and stance is narrow based. Gait shows normal stride length and normal pace. No problems turning are noted.   Assessment and Plan:  In summary, Andrea Lowery is a very pleasant 81 y.o.-year old female with an underlying complex medical history of hypertension, hypothyroidism, left bundle branch block, DVT, hyperlipidemia, arthritis, asthma, MGUS, migraines, diabetes, and borderline obesity, whose history and physical exam are concerning for obstructive sleep apnea (OSA). I had a long chat with the patient about my findings and the diagnosis of OSA, its prognosis and treatment options. We talked about medical treatments, surgical interventions and non-pharmacological approaches. I explained in particular the  risks and ramifications of untreated moderate to severe OSA, especially with respect to developing cardiovascular disease down the Road, including congestive heart failure, difficult to treat hypertension, cardiac arrhythmias, or stroke. Even type 2 diabetes has, in part, been linked to untreated OSA. Symptoms of untreated OSA include daytime sleepiness, memory problems, mood irritability and mood disorder such as depression and anxiety, lack of energy, as well as recurrent headaches, especially morning headaches. We talked about trying to maintain a healthy lifestyle in general, as well as the importance of weight control. We also talked about the importance of good sleep hygiene. I recommended the following at this time: sleep study.  I outlined the differences between a laboratory attended sleep study versus home sleep test.  She is advised to use her soda intake. I explained the sleep test procedure to the patient and also outlined possible surgical and non-surgical treatment options of OSA, including the use of a custom-made dental device (which would require a referral to a specialist dentist or oral surgeon), upper airway surgical options, such as traditional UPPP or a novel less invasive surgical option in the form of Inspire hypoglossal nerve stimulation (which would involve a referral to an ENT surgeon). I also explained the CPAP treatment option to the patient, who indicated that she would be willing to try PAP therapy again, if the need arises.  We will pick up our discussion after testing.  We will keep her posted as to her test results by phone call and plan a follow-up in sleep clinic accordingly.   Thank you very much for allowing me to participate in the care of this nice patient. If I can be of any further assistance to you please do not hesitate to call me at [phone].  Sincerely,   Andrea Foley, MD, PhD

## 2021-04-30 NOTE — Patient Instructions (Signed)

## 2021-05-01 DIAGNOSIS — J3089 Other allergic rhinitis: Secondary | ICD-10-CM

## 2021-05-02 DIAGNOSIS — J302 Other seasonal allergic rhinitis: Secondary | ICD-10-CM

## 2021-05-14 ENCOUNTER — Ambulatory Visit: Payer: Medicare HMO

## 2021-05-16 ENCOUNTER — Other Ambulatory Visit: Payer: Self-pay

## 2021-05-16 DIAGNOSIS — E039 Hypothyroidism, unspecified: Secondary | ICD-10-CM

## 2021-05-16 DIAGNOSIS — I10 Essential (primary) hypertension: Secondary | ICD-10-CM

## 2021-05-17 MED ORDER — AMLODIPINE-OLMESARTAN 10-40 MG PO TABS: 1.0000 | ORAL_TABLET | Freq: Every day | ORAL | 3 refills | Status: DC

## 2021-05-17 MED ORDER — IPRATROPIUM-ALBUTEROL 0.5-2.5 (3) MG/3ML IN SOLN: 3.0000 mL | Freq: Four times a day (QID) | RESPIRATORY_TRACT | 2 refills | Status: DC | PRN

## 2021-05-17 MED ORDER — ESOMEPRAZOLE MAGNESIUM 40 MG PO CPDR: 40.0000 mg | DELAYED_RELEASE_CAPSULE | Freq: Two times a day (BID) | ORAL | 1 refills | Status: DC

## 2021-05-17 MED ORDER — TOPIRAMATE 50 MG PO TABS: 50.0000 mg | ORAL_TABLET | Freq: Every day | ORAL | 3 refills | Status: DC

## 2021-05-17 MED ORDER — HYDROCHLOROTHIAZIDE 12.5 MG PO CAPS: 12.5000 mg | ORAL_CAPSULE | Freq: Every day | ORAL | 2 refills | Status: DC

## 2021-05-17 MED ORDER — BD SWAB SINGLE USE REGULAR PADS: 1.0000 "application " | MEDICATED_PAD | 0 refills | Status: DC | PRN

## 2021-05-17 MED ORDER — HYDROXYZINE HCL 10 MG PO TABS: 10.0000 mg | ORAL_TABLET | Freq: Three times a day (TID) | ORAL | 1 refills | Status: DC | PRN

## 2021-05-17 MED ORDER — LEVOTHYROXINE SODIUM 50 MCG PO TABS: 50.0000 ug | ORAL_TABLET | Freq: Every day | ORAL | 0 refills | Status: DC

## 2021-05-17 MED ORDER — DULOXETINE HCL 30 MG PO CPEP: 30.0000 mg | ORAL_CAPSULE | Freq: Every day | ORAL | 0 refills | Status: DC

## 2021-05-17 MED ORDER — ROSUVASTATIN CALCIUM 40 MG PO TABS: ORAL_TABLET | ORAL | 0 refills | Status: DC

## 2021-05-17 MED ORDER — ALBUTEROL SULFATE HFA 108 (90 BASE) MCG/ACT IN AERS: 2.0000 | INHALATION_SPRAY | RESPIRATORY_TRACT | 1 refills | Status: DC | PRN

## 2021-05-18 ENCOUNTER — Other Ambulatory Visit: Payer: Self-pay | Admitting: *Deleted

## 2021-05-18 ENCOUNTER — Other Ambulatory Visit: Payer: Self-pay | Admitting: Internal Medicine

## 2021-05-18 DIAGNOSIS — I10 Essential (primary) hypertension: Secondary | ICD-10-CM

## 2021-05-18 MED ORDER — EMPAGLIFLOZIN 10 MG PO TABS: 10.0000 mg | ORAL_TABLET | Freq: Every day | ORAL | 3 refills | Status: DC

## 2021-05-18 MED ORDER — METOPROLOL SUCCINATE ER 25 MG PO TB24: 25.0000 mg | ORAL_TABLET | Freq: Every day | ORAL | 2 refills | Status: DC

## 2021-05-21 ENCOUNTER — Other Ambulatory Visit: Payer: Self-pay | Admitting: *Deleted

## 2021-05-21 NOTE — Telephone Encounter (Signed)
Received call from [facility] requesting refills on: ? ?Hydralazine ?Gabapentin (no longer on current med list) ?Berna Bue ?Lancets (will need testing directions) ?True Metrix meter ?True Metrix test strips (will need testing directions) ?

## 2021-05-22 MED ORDER — TRUEPLUS LANCETS 33G MISC: 1 refills | Status: DC

## 2021-05-22 MED ORDER — TRUE METRIX METER W/DEVICE KIT
PACK | 1 refills | Status: DC
Start: 2021-05-22 — End: 2023-09-30

## 2021-05-22 MED ORDER — HYDRALAZINE HCL 10 MG PO TABS: 10.0000 mg | ORAL_TABLET | Freq: Three times a day (TID) | ORAL | 0 refills | Status: DC

## 2021-05-22 MED ORDER — FASENRA PEN 30 MG/ML ~~LOC~~ SOAJ: 30.0000 mg | SUBCUTANEOUS | 6 refills | Status: DC

## 2021-05-22 MED ORDER — TRUE METRIX BLOOD GLUCOSE TEST VI STRP: ORAL_STRIP | 12 refills | Status: DC

## 2021-05-23 ENCOUNTER — Other Ambulatory Visit: Payer: Self-pay | Admitting: Internal Medicine

## 2021-05-29 ENCOUNTER — Ambulatory Visit (INDEPENDENT_AMBULATORY_CARE_PROVIDER_SITE_OTHER): Payer: Medicare HMO

## 2021-05-29 DIAGNOSIS — J309 Allergic rhinitis, unspecified: Secondary | ICD-10-CM

## 2021-06-03 ENCOUNTER — Ambulatory Visit (INDEPENDENT_AMBULATORY_CARE_PROVIDER_SITE_OTHER): Payer: Medicare HMO | Admitting: Neurology

## 2021-06-03 DIAGNOSIS — G4720 Circadian rhythm sleep disorder, unspecified type: Secondary | ICD-10-CM

## 2021-06-03 DIAGNOSIS — R0683 Snoring: Secondary | ICD-10-CM | POA: Diagnosis not present

## 2021-06-03 DIAGNOSIS — E669 Obesity, unspecified: Secondary | ICD-10-CM

## 2021-06-03 DIAGNOSIS — G4733 Obstructive sleep apnea (adult) (pediatric): Secondary | ICD-10-CM

## 2021-06-03 DIAGNOSIS — R351 Nocturia: Secondary | ICD-10-CM

## 2021-06-03 DIAGNOSIS — R519 Headache, unspecified: Secondary | ICD-10-CM

## 2021-06-03 DIAGNOSIS — G4719 Other hypersomnia: Secondary | ICD-10-CM

## 2021-06-04 ENCOUNTER — Ambulatory Visit: Payer: Medicare HMO

## 2021-06-05 ENCOUNTER — Ambulatory Visit (INDEPENDENT_AMBULATORY_CARE_PROVIDER_SITE_OTHER): Payer: Medicare HMO

## 2021-06-05 DIAGNOSIS — T63441D Toxic effect of venom of bees, accidental (unintentional), subsequent encounter: Secondary | ICD-10-CM

## 2021-06-07 ENCOUNTER — Encounter: Payer: Self-pay | Admitting: Emergency Medicine

## 2021-06-07 ENCOUNTER — Ambulatory Visit
Admission: EM | Admit: 2021-06-07 | Discharge: 2021-06-07 | Disposition: A | Discharge status: Home / Self Care | Payer: Medicare HMO | Attending: Physician Assistant | Admitting: Physician Assistant

## 2021-06-07 DIAGNOSIS — J028 Acute pharyngitis due to other specified organisms: Secondary | ICD-10-CM

## 2021-06-07 DIAGNOSIS — J069 Acute upper respiratory infection, unspecified: Secondary | ICD-10-CM | POA: Diagnosis not present

## 2021-06-07 DIAGNOSIS — R0981 Nasal congestion: Secondary | ICD-10-CM

## 2021-06-07 LAB — POCT RAPID STREP A (OFFICE): Rapid Strep A Screen: NEGATIVE

## 2021-06-07 NOTE — Discharge Instructions (Addendum)
Continue mucinex for congestion. ?Fluids, tylenol for throat pain. ?Follow-up PCP if symptoms fail to improve. ?

## 2021-06-07 NOTE — ED Provider Notes (Signed)
[name]    CSN: [id number] Arrival date & time: 06/07/21  1027      History   Chief Complaint Chief Complaint  Patient presents with   Cough   Sore Throat    HPI Andrea Lowery is a 81 y.o. female.     , Sinus congestion, mild cough  Patient is well, patient is patient also medications congestion, sinus congestion, maxillary tenderness.  Patient indicates she has had some chest congestion associated with mild cough, intermittently, production..  Patient reports she has not been coughing or chills, no shortness of breath.  Patient indicates she has not been closed to any friends or family that have been sick.   Cough Associated symptoms: sore throat   Sore Throat   Past Medical History:  Diagnosis Date   Arthritis    Asthma    Diabetes mellitus without complication (HCC)    Diabetes II   DVT (deep venous thrombosis) (HCC)    "Years ago"   Dyslipidemia    Eczema    GERD (gastroesophageal reflux disease)    Headache    Migraine   Hypertension    Hypothyroidism    LBBB (left bundle branch block)    MGUS (monoclonal gammopathy of unknown significance)    MGUS (monoclonal gammopathy of unknown significance)    Migraine    OSA on CPAP     Patient Active Problem List   Diagnosis Date Noted   Upper respiratory infection 12/04/2020   Protrusion of lumbar intervertebral disc 09/13/2019   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   Eczema 08/02/2016   Hypokalemia 04/30/2016   Acute severe exacerbation of asthma 01/10/2016   Seborrheic dermatitis of scalp 11/20/2015   Osteoporosis without current pathological fracture 05/30/2015   Chronic right-sided low back pain with right-sided sciatica 03/31/2015   Temporal arteritis (HCC) 11/07/2014   Type 2 diabetes mellitus (HCC) 11/07/2014   Diastolic CHF (HCC) 11/07/2014   Sacroiliac dysfunction 11/04/2014   GERD (gastroesophageal reflux disease) 10/05/2014   Hypothyroidism 09/21/2014   Anxiety 03/18/2014    Difficulty sleeping 12/28/2013   Hyperlipidemia 12/27/2013   Bilateral leg edema 07/20/2013   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/2013   Monoclonal paraproteinemia 05/20/2013   Essential hypertension 07/01/2012   DOE (dyspnea on exertion) 07/01/2012   OSA on CPAP 07/01/2012    Past Surgical History:  Procedure Laterality Date   ABDOMINAL HYSTERECTOMY  1969   ARTERY BIOPSY N/A 11/07/2014   Procedure: BIOPSY TEMPORAL ARTERY;  Surgeon: Newman Pies, MD;  Location: [facility] OR;  Service: ENT;  Laterality: N/A;   CARPAL TUNNEL RELEASE Right    CHOLECYSTECTOMY  1976   ENDOVENOUS ABLATION SAPHENOUS VEIN W/ LASER Left 08/24/2020   endovenous laser ablation left greater saphenous vein by Cari Caraway MD   LUMBAR LAMINECTOMY/DECOMPRESSION MICRODISCECTOMY N/A 07/31/2015   Procedure: L4-5 Decompression, Possible Right L4-5 Microdiscectomy;  Surgeon: Eldred Manges, MD;  Location: [facility] OR;  Service: Orthopedics;  Laterality: N/A;   NM MYOVIEW LTD  02/13/2010   No ischemia   PLANTAR FASCIA RELEASE     TONSILLECTOMY  1965   US ECHOCARDIOGRAPHY  09/20/2008   borderline LVH,mild TR,AOV mildly sclerotic w/ca+ of the leaflets    OB History   No obstetric history on file.      Home Medications    Prior to Admission medications   Medication Sig Start Date End Date Taking? Authorizing Provider  albuterol (VENTOLIN HFA) 108 (90 Base) MCG/ACT inhaler Inhale 2 puffs into the lungs every  4 (four) hours as needed for wheezing or shortness of breath. 05/17/21   Dellis Filbert, MD  Alcohol Swabs (B-D SINGLE USE SWABS REGULAR) PADS Apply 1 application. topically as needed. 05/17/21   Dellis Filbert, MD  alendronate (FOSAMAX) 70 MG tablet Take 70 mg by mouth every Wednesday.  05/30/15   [provider]  amLODipine-olmesartan (AZOR) 10-40 MG tablet Take 1 tablet by mouth daily. 05/17/21 05/12/22  Dellis Filbert, MD  azithromycin (ZITHROMAX) 500 MG tablet Take 1 tablet (500 mg total)  by mouth daily. 02/13/21   Kozlow, Alvira Philips, MD  Benralizumab (FASENRA PEN) 30 MG/ML SOAJ Inject 1 mL (30 mg total) into the skin every 8 (eight) weeks. 05/22/21   Dellis Filbert, MD  Blood Glucose Monitoring Suppl (TRUE METRIX METER) w/Device KIT Use as instructed 05/22/21   Dellis Filbert, MD  budesonide-formoterol (SYMBICORT) 160-4.5 MCG/ACT inhaler INHALE 2 PUFFS BY MOUTH INTO THE LUNGS TWICE DAILY RINSE GARGLE AND SPIT AFTER USE 08/31/20   Kozlow, Alvira Philips, MD  carbamazepine (TEGRETOL XR) 200 MG 12 hr tablet Take 1 tablet (200 mg total) by mouth daily. 09/13/20   Evlyn Kanner, MD  cetirizine (ZYRTEC) 10 MG tablet TAKE 1 TABLET BY MOUTH 1 TO 2 TIMES A DAY FOR ALLERGIES 03/02/21   Kozlow, Alvira Philips, MD  cetirizine (ZYRTEC) 5 MG tablet Take 1 tablet (5 mg total) by mouth daily. 10/12/20   Wallis Bamberg, PA-C  DULoxetine (CYMBALTA) 30 MG capsule Take 1 capsule (30 mg total) by mouth daily. 05/17/21   Dellis Filbert, MD  empagliflozin (JARDIANCE) 10 MG TABS tablet Take 1 tablet (10 mg total) by mouth daily. 05/18/21   Dellis Filbert, MD  EPINEPHrine 0.3 mg/0.3 mL IJ SOAJ injection Use as directed for severe allergic reaction 02/06/21   Kozlow, Alvira Philips, MD  esomeprazole (NEXIUM) 40 MG capsule Take 1 capsule (40 mg total) by mouth 2 (two) times daily. 05/17/21   Dellis Filbert, MD  fluticasone (FLONASE) 50 MCG/ACT nasal spray Place 2 sprays into both nostrils daily. 02/06/21   Kozlow, Alvira Philips, MD  glucose blood (TRUE METRIX BLOOD GLUCOSE TEST) test strip Use as instructed 05/22/21   Dellis Filbert, MD  hydrALAZINE (APRESOLINE) 10 MG tablet Take 1 tablet (10 mg total) by mouth 3 (three) times daily. 05/22/21   Dellis Filbert, MD  hydrochlorothiazide (MICROZIDE) 12.5 MG capsule Take 1 capsule (12.5 mg total) by mouth daily. 05/17/21 08/15/21  Dellis Filbert, MD  hydrOXYzine (ATARAX) 10 MG tablet Take 1 tablet (10 mg total) by mouth 3 (three) times daily as needed for itching. 05/17/21   Dellis Filbert, MD   ipratropium-albuterol (DUONEB) 0.5-2.5 (3) MG/3ML SOLN Take 3 mLs by nebulization every 6 (six) hours as needed. 05/17/21   Dellis Filbert, MD  ketoconazole (NIZORAL) 2 % cream  11/29/19   [provider]  levothyroxine (SYNTHROID) 50 MCG tablet Take 1 tablet (50 mcg total) by mouth daily. 05/17/21 08/15/21  Dellis Filbert, MD  metoprolol succinate (TOPROL-XL) 25 MG 24 hr tablet Take 1 tablet (25 mg total) by mouth daily. PATIENT MUST SCHEDULE APPOINTMENT FOR FUTURE REFILLS. SECOND ATTEMPT. 05/18/21 08/16/21  Dellis Filbert, MD  montelukast (SINGULAIR) 10 MG tablet Take 1 tablet (10 mg total) by mouth at bedtime. 02/06/21   Kozlow, Alvira Philips, MD  predniSONE (DELTASONE) 10 MG tablet Take 2 tablets (20 mg total) by mouth daily with breakfast. 02/13/21   Kozlow, Alvira Philips, MD  rosuvastatin (CRESTOR) 40 MG tablet TAKE 1 TABLET(40 MG) BY MOUTH DAILY 05/17/21  Dellis Filbert, MD  topiramate (TOPAMAX) 50 MG tablet Take 1 tablet (50 mg total) by mouth daily. 05/17/21 08/15/21  Dellis Filbert, MD  triamcinolone cream (KENALOG) 0.1 % Apply 1 application topically daily. 08/30/20   Janalyn Harder, MD  TRUEplus Lancets 33G MISC Apply topically 05/22/21   Dellis Filbert, MD  Vitamin D, Ergocalciferol, (DRISDOL) 1.25 MG (50000 UNIT) CAPS capsule Take 50,000 Units by mouth every 7 (seven) days. Pt takes on Wednesday of each week    [provider]    Family History Family History  Problem Relation Age of Onset   Diabetes Father    Heart failure Father    Hypertension Father    Sleep apnea Father    Thyroid disease Sister    Hypertension Sister     Social History Social History   Tobacco Use   Smoking status: Former    Types: Cigarettes    Quit date: 02/04/2006    Years since quitting: 15.3   Smokeless tobacco: Never  Vaping Use   Vaping Use: Never used  Substance Use Topics   Alcohol use: No    Alcohol/week: 0.0 standard drinks   Drug use: No     Allergies   Bee venom and  Penicillins   Review of Systems Review of Systems  HENT:  Positive for sinus pressure and sore throat.   Respiratory:  Positive for cough.     Physical Exam Triage Vital Signs ED Triage Vitals [06/07/21 1034]  Enc Vitals Group     BP (!) 175/90     Pulse Rate 75     Resp 17     Temp 98.1 F (36.7 C)     Temp Source Oral     SpO2 97 %     Weight      Height      Head Circumference      Peak Flow      Pain Score 8     Pain Loc      Pain Edu?      Excl. in GC?    No data found.  Updated Vital Signs BP (!) 175/90   Pulse 75   Temp 98.1 F (36.7 C) (Oral)   Resp 17   SpO2 97%   Visual Acuity Right Eye Distance:   Left Eye Distance:   Bilateral Distance:    Right Eye Near:   Left Eye Near:    Bilateral Near:     Physical Exam HENT:     Right Ear: Ear canal normal. Tympanic membrane is injected.     Left Ear: Ear canal normal. Tympanic membrane is injected.     Mouth/Throat:     Mouth: Mucous membranes are moist.     Pharynx: Oropharynx is clear.     Comments: Throat: minimal redness without exudate or swelling. Neck:     Comments: Neck: mild pain on palpation of the left side of neck, no lymphadenopathy. Cardiovascular:     Rate and Rhythm: Normal rate and regular rhythm.     Comments: Heart: RRR without murmurs. Pulmonary:     Effort: Pulmonary effort is normal.     Breath sounds: Normal breath sounds and air entry.  Neurological:     Mental Status: She is alert.     UC Treatments / Results  Labs (all labs ordered are listed, but only abnormal results are displayed) Labs Reviewed  POCT RAPID STREP A (OFFICE)    EKG   Radiology No results found.  Procedures Procedures (including critical care time)  Medications Ordered in UC Medications - No data to display  Initial Impression / Assessment and Plan / UC Course  I have reviewed the triage vital signs and the nursing notes.  Pertinent labs & imaging results that were available during  my care of the patient were reviewed by me and considered in my medical decision making (see chart for details).  Plan: Patient advised to continue mucinex for congestion. Patient advised to take Tylenol as needed for throat pain. Patient advised to use salt water gargles for throat pain. Patient advised to return if symptoms fail to improve or follow-up PCP.   Final Clinical Impressions(s) / UC Diagnoses   Final diagnoses:  Sinus congestion  Acute pharyngitis due to other specified organisms  Acute upper respiratory infection     Discharge Instructions      Continue mucinex for congestion. Fluids, tylenol for throat pain. Follow-up PCP if symptoms fail to improve.   ED Prescriptions   None    PDMP not reviewed this encounter.   Ellsworth Lennox, PA-C 06/07/21 1102

## 2021-06-07 NOTE — ED Triage Notes (Signed)
Pt is present today with cough, nasal congestion, and sore throat. Pt sx started Tuesday  ?

## 2021-06-11 ENCOUNTER — Ambulatory Visit
Admission: EM | Admit: 2021-06-11 | Discharge: 2021-06-11 | Disposition: A | Discharge status: Home / Self Care | Payer: Medicare HMO | Attending: Physician Assistant | Admitting: Physician Assistant

## 2021-06-11 ENCOUNTER — Other Ambulatory Visit: Payer: Self-pay | Admitting: Internal Medicine

## 2021-06-11 ENCOUNTER — Ambulatory Visit (INDEPENDENT_AMBULATORY_CARE_PROVIDER_SITE_OTHER): Payer: Medicare HMO

## 2021-06-11 ENCOUNTER — Ambulatory Visit: Payer: Medicare HMO

## 2021-06-11 DIAGNOSIS — R059 Cough, unspecified: Secondary | ICD-10-CM

## 2021-06-11 DIAGNOSIS — J209 Acute bronchitis, unspecified: Secondary | ICD-10-CM

## 2021-06-11 DIAGNOSIS — R0989 Other specified symptoms and signs involving the circulatory and respiratory systems: Secondary | ICD-10-CM | POA: Diagnosis not present

## 2021-06-11 DIAGNOSIS — J45909 Unspecified asthma, uncomplicated: Secondary | ICD-10-CM

## 2021-06-11 MED ORDER — PREDNISONE 50 MG PO TABS: 60.0000 mg | ORAL_TABLET | Freq: Every day | ORAL | Status: DC

## 2021-06-11 MED ORDER — DOXYCYCLINE HYCLATE 100 MG PO CAPS
100.0000 mg | ORAL_CAPSULE | Freq: Two times a day (BID) | ORAL | 0 refills | Status: DC
Start: 2021-06-11 — End: 2021-10-10

## 2021-06-11 MED ORDER — PREDNISONE 50 MG PO TABS: ORAL_TABLET | ORAL | 0 refills | Status: DC

## 2021-06-11 NOTE — ED Triage Notes (Signed)
Patient presents to Urgent Care with complaints of cough, "wheezing" and congestion since 2 weeks ago. Patient reports otc medication and no improvement.  ? ?

## 2021-06-11 NOTE — ED Provider Notes (Signed)
[name]    CSN: [id number] Arrival date & time: 06/11/21  1111      History   Chief Complaint Chief Complaint  Patient presents with   chest congestion    HPI Andrea Lowery is a 81 y.o. female.   Pt complains of coughing up yellow phelgm.  Pt report she has asthma.  Pt using nebulizer and inhaler without relief   The history is provided by the patient. No language interpreter was used.  Cough Cough characteristics:  Non-productive Sputum characteristics:  Nondescript Severity:  Moderate Onset quality:  Gradual Duration:  5 days Timing:  Constant Progression:  Worsening Chronicity:  New Context: upper respiratory infection   Relieved by:  Nothing Worsened by:  Nothing Ineffective treatments:  None tried Associated symptoms: shortness of breath   Associated symptoms: no rhinorrhea and no wheezing    Past Medical History:  Diagnosis Date   Arthritis    Asthma    Diabetes mellitus without complication (HCC)    Diabetes II   DVT (deep venous thrombosis) (HCC)    "Years ago"   Dyslipidemia    Eczema    GERD (gastroesophageal reflux disease)    Headache    Migraine   Hypertension    Hypothyroidism    LBBB (left bundle branch block)    MGUS (monoclonal gammopathy of unknown significance)    MGUS (monoclonal gammopathy of unknown significance)    Migraine    OSA on CPAP     Patient Active Problem List   Diagnosis Date Noted   Upper respiratory infection 12/04/2020   Protrusion of lumbar intervertebral disc 09/13/2019   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   Eczema 08/02/2016   Hypokalemia 04/30/2016   Acute severe exacerbation of asthma 01/10/2016   Seborrheic dermatitis of scalp 11/20/2015   Osteoporosis without current pathological fracture 05/30/2015   Chronic right-sided low back pain with right-sided sciatica 03/31/2015   Temporal arteritis (HCC) 11/07/2014   Type 2 diabetes mellitus (HCC) 11/07/2014   Diastolic CHF (HCC) 11/07/2014    Sacroiliac dysfunction 11/04/2014   GERD (gastroesophageal reflux disease) 10/05/2014   Hypothyroidism 09/21/2014   Anxiety 03/18/2014   Difficulty sleeping 12/28/2013   Hyperlipidemia 12/27/2013   Bilateral leg edema 07/20/2013   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/2013   Monoclonal paraproteinemia 05/20/2013   Essential hypertension 07/01/2012   DOE (dyspnea on exertion) 07/01/2012   OSA on CPAP 07/01/2012    Past Surgical History:  Procedure Laterality Date   ABDOMINAL HYSTERECTOMY  1969   ARTERY BIOPSY N/A 11/07/2014   Procedure: BIOPSY TEMPORAL ARTERY;  Surgeon: Newman Pies, MD;  Location: [facility] OR;  Service: ENT;  Laterality: N/A;   CARPAL TUNNEL RELEASE Right    CHOLECYSTECTOMY  1976   ENDOVENOUS ABLATION SAPHENOUS VEIN W/ LASER Left 08/24/2020   endovenous laser ablation left greater saphenous vein by Cari Caraway MD   LUMBAR LAMINECTOMY/DECOMPRESSION MICRODISCECTOMY N/A 07/31/2015   Procedure: L4-5 Decompression, Possible Right L4-5 Microdiscectomy;  Surgeon: Eldred Manges, MD;  Location: [facility] OR;  Service: Orthopedics;  Laterality: N/A;   NM MYOVIEW LTD  02/13/2010   No ischemia   PLANTAR FASCIA RELEASE     TONSILLECTOMY  1965   US ECHOCARDIOGRAPHY  09/20/2008   borderline LVH,mild TR,AOV mildly sclerotic w/ca+ of the leaflets    OB History   No obstetric history on file.      Home Medications    Prior to Admission medications   Medication Sig Start Date End Date Taking?  Authorizing Provider  doxycycline (VIBRAMYCIN) 100 MG capsule Take 1 capsule (100 mg total) by mouth 2 (two) times daily. 06/11/21  Yes Cheron Schaumann K, PA-C  predniSONE (DELTASONE) 50 MG tablet One tablet a day 06/11/21  Yes Elson Areas, PA-C  albuterol (VENTOLIN HFA) 108 (90 Base) MCG/ACT inhaler Inhale 2 puffs into the lungs every 4 (four) hours as needed for wheezing or shortness of breath. 05/17/21   Dellis Filbert, MD  Alcohol Swabs (B-D SINGLE USE SWABS REGULAR) PADS  Apply 1 application. topically as needed. 05/17/21   Dellis Filbert, MD  alendronate (FOSAMAX) 70 MG tablet Take 70 mg by mouth every Wednesday.  05/30/15   [provider]  amLODipine-olmesartan (AZOR) 10-40 MG tablet Take 1 tablet by mouth daily. 05/17/21 05/12/22  Dellis Filbert, MD  azithromycin (ZITHROMAX) 500 MG tablet Take 1 tablet (500 mg total) by mouth daily. 02/13/21   Kozlow, Alvira Philips, MD  Benralizumab (FASENRA PEN) 30 MG/ML SOAJ Inject 1 mL (30 mg total) into the skin every 8 (eight) weeks. 05/22/21   Dellis Filbert, MD  Blood Glucose Monitoring Suppl (TRUE METRIX METER) w/Device KIT Use as instructed 05/22/21   Dellis Filbert, MD  budesonide-formoterol (SYMBICORT) 160-4.5 MCG/ACT inhaler INHALE 2 PUFFS BY MOUTH INTO THE LUNGS TWICE DAILY RINSE GARGLE AND SPIT AFTER USE 08/31/20   Kozlow, Alvira Philips, MD  carbamazepine (TEGRETOL XR) 200 MG 12 hr tablet Take 1 tablet (200 mg total) by mouth daily. Patient not taking: Reported on 06/11/2021 09/13/20   Evlyn Kanner, MD  cetirizine (ZYRTEC) 10 MG tablet TAKE 1 TABLET BY MOUTH 1 TO 2 TIMES A DAY FOR ALLERGIES 03/02/21   Kozlow, Alvira Philips, MD  cetirizine (ZYRTEC) 5 MG tablet Take 1 tablet (5 mg total) by mouth daily. 10/12/20   Wallis Bamberg, PA-C  DULoxetine (CYMBALTA) 30 MG capsule Take 1 capsule (30 mg total) by mouth daily. 05/17/21   Dellis Filbert, MD  empagliflozin (JARDIANCE) 10 MG TABS tablet Take 1 tablet (10 mg total) by mouth daily. 05/18/21   Dellis Filbert, MD  EPINEPHrine 0.3 mg/0.3 mL IJ SOAJ injection Use as directed for severe allergic reaction 02/06/21   Kozlow, Alvira Philips, MD  esomeprazole (NEXIUM) 40 MG capsule Take 1 capsule (40 mg total) by mouth 2 (two) times daily. 05/17/21   Dellis Filbert, MD  fluticasone (FLONASE) 50 MCG/ACT nasal spray Place 2 sprays into both nostrils daily. 02/06/21   Kozlow, Alvira Philips, MD  glucose blood (TRUE METRIX BLOOD GLUCOSE TEST) test strip Use as instructed 05/22/21   Dellis Filbert, MD  hydrALAZINE  (APRESOLINE) 10 MG tablet Take 1 tablet (10 mg total) by mouth 3 (three) times daily. 05/22/21   Dellis Filbert, MD  hydrochlorothiazide (MICROZIDE) 12.5 MG capsule Take 1 capsule (12.5 mg total) by mouth daily. 05/17/21 08/15/21  Dellis Filbert, MD  hydrOXYzine (ATARAX) 10 MG tablet Take 1 tablet (10 mg total) by mouth 3 (three) times daily as needed for itching. 05/17/21   Dellis Filbert, MD  ipratropium-albuterol (DUONEB) 0.5-2.5 (3) MG/3ML SOLN Take 3 mLs by nebulization every 6 (six) hours as needed. 05/17/21   Dellis Filbert, MD  ketoconazole (NIZORAL) 2 % cream  11/29/19   [provider]  levothyroxine (SYNTHROID) 50 MCG tablet Take 1 tablet (50 mcg total) by mouth daily. 05/17/21 08/15/21  Dellis Filbert, MD  metoprolol succinate (TOPROL-XL) 25 MG 24 hr tablet Take 1 tablet (25 mg total) by mouth daily. PATIENT MUST SCHEDULE APPOINTMENT FOR FUTURE REFILLS. SECOND ATTEMPT. 05/18/21 08/16/21  Dellis Filbert, MD  montelukast (SINGULAIR) 10 MG tablet Take 1 tablet (10 mg total) by mouth at bedtime. 02/06/21   Kozlow, Alvira Philips, MD  rosuvastatin (CRESTOR) 40 MG tablet TAKE 1 TABLET(40 MG) BY MOUTH DAILY 05/17/21   Dellis Filbert, MD  topiramate (TOPAMAX) 50 MG tablet Take 1 tablet (50 mg total) by mouth daily. 05/17/21 08/15/21  Dellis Filbert, MD  triamcinolone cream (KENALOG) 0.1 % Apply 1 application topically daily. 08/30/20   Janalyn Harder, MD  TRUEplus Lancets 33G MISC Apply topically 05/22/21   Dellis Filbert, MD  Vitamin D, Ergocalciferol, (DRISDOL) 1.25 MG (50000 UNIT) CAPS capsule Take 50,000 Units by mouth every 7 (seven) days. Pt takes on Wednesday of each week    [provider]    Family History Family History  Problem Relation Age of Onset   Diabetes Father    Heart failure Father    Hypertension Father    Sleep apnea Father    Thyroid disease Sister    Hypertension Sister     Social History Social History   Tobacco Use   Smoking status: Former     Types: Cigarettes    Quit date: 02/04/2006    Years since quitting: 15.3   Smokeless tobacco: Never  Vaping Use   Vaping Use: Never used  Substance Use Topics   Alcohol use: No    Alcohol/week: 0.0 standard drinks   Drug use: No     Allergies   Bee venom and Penicillins   Review of Systems Review of Systems  HENT:  Negative for rhinorrhea.   Respiratory:  Positive for cough and shortness of breath. Negative for wheezing.   All other systems reviewed and are negative.   Physical Exam Triage Vital Signs ED Triage Vitals  Enc Vitals Group     BP 06/11/21 1310 (!) 158/80     Pulse Rate 06/11/21 1310 65     Resp 06/11/21 1310 18     Temp 06/11/21 1310 98 F (36.7 C)     Temp Source 06/11/21 1310 Oral     SpO2 06/11/21 1310 99 %     Weight --      Height --      Head Circumference --      Peak Flow --      Pain Score 06/11/21 1308 8     Pain Loc --      Pain Edu? --      Excl. in GC? --    No data found.  Updated Vital Signs BP (!) 158/80 (BP Location: Right Arm)   Pulse 65   Temp 98 F (36.7 C) (Oral)   Resp 18   SpO2 99%   Visual Acuity Right Eye Distance:   Left Eye Distance:   Bilateral Distance:    Right Eye Near:   Left Eye Near:    Bilateral Near:     Physical Exam Vitals and nursing note reviewed.  Constitutional:      Appearance: She is well-developed.  HENT:     Head: Normocephalic.     Mouth/Throat:     Mouth: Mucous membranes are moist.  Cardiovascular:     Rate and Rhythm: Normal rate and regular rhythm.  Pulmonary:     Effort: Pulmonary effort is normal.     Breath sounds: Wheezing and rhonchi present.  Abdominal:     General: There is no distension.  Musculoskeletal:        General: Normal range of motion.  Cervical back: Normal range of motion.  Skin:    General: Skin is warm.  Neurological:     General: No focal deficit present.     Mental Status: She is alert and oriented to person, place, and time.  Psychiatric:         Mood and Affect: Mood normal.     UC Treatments / Results  Labs (all labs ordered are listed, but only abnormal results are displayed) Labs Reviewed - No data to display  EKG   Radiology DG Chest 2 View  Result Date: 06/11/2021 CLINICAL DATA:  Cough, congestion EXAM: CHEST - 2 VIEW COMPARISON:  09/20/2017 FINDINGS: Cardiac and mediastinal contours are within normal limits. No focal pulmonary opacity. No pleural effusion or pneumothorax. No acute osseous abnormality. IMPRESSION: No acute cardiopulmonary process. Electronically Signed   By: Wiliam Ke M.D.   On: 06/11/2021 13:49    Procedures Procedures (including critical care time)  Medications Ordered in UC Medications  predniSONE (DELTASONE) tablet 60 mg (has no administration in time range)    Initial Impression / Assessment and Plan / UC Course  I have reviewed the triage vital signs and the nursing notes.  Pertinent labs & imaging results that were available during my care of the patient were reviewed by me and considered in my medical decision making (see chart for details).    MDM: Chest x-ray shows no signs of pneumonia patient's symptoms consistent with bronchitis I will treat her with doxycycline and prednisone given asthma with no relief with albuterol inhaler and nebulized albuterol  Final Clinical Impressions(s) / UC Diagnoses   Final diagnoses:  Acute bronchitis, unspecified organism  Moderate asthma, unspecified whether complicated, unspecified whether persistent   Discharge Instructions   None    ED Prescriptions     Medication Sig Dispense Auth. Provider   doxycycline (VIBRAMYCIN) 100 MG capsule Take 1 capsule (100 mg total) by mouth 2 (two) times daily. 20 capsule Verlon Pischke K, PA-C   predniSONE (DELTASONE) 50 MG tablet One tablet a day 5 tablet Elson Areas, [place]      PDMP not reviewed this encounter. An After Visit Summary was printed and given to the patient.    Elson Areas,  [place] 06/11/21 1601

## 2021-06-12 ENCOUNTER — Other Ambulatory Visit: Payer: Self-pay | Admitting: Internal Medicine

## 2021-06-13 ENCOUNTER — Other Ambulatory Visit: Payer: Self-pay | Admitting: Internal Medicine

## 2021-06-13 ENCOUNTER — Telehealth: Payer: Self-pay | Admitting: *Deleted

## 2021-06-13 ENCOUNTER — Other Ambulatory Visit: Payer: Self-pay | Admitting: Student

## 2021-06-13 NOTE — Telephone Encounter (Signed)
Call from patient c/o cough.  Brown to yellow .  Some wheezing.  No fever but has sweats.  Has been to Urgent Care x 2.  States when she lies down she hears the wheezing in  her chest-Noisey.  Patient was also started on an Antibiotic.Marland Kitchen  Refuses to go back to Urgent Care.   ?

## 2021-06-13 NOTE — Telephone Encounter (Signed)
Dr Jeanice Lim does not have any open appts tomorrow. Appt schedule w/Dr [facility] tomorrow 06/14/21 '@0915AM'$  - pt is aware, ?

## 2021-06-13 NOTE — Procedures (Signed)
PATIENT'S NAME:  Andrea Lowery, Andrea Lowery ?DOB:      Sep 06, 1940      ?MR#:    [id number]     ?DATE OF RECORDING: 06/03/2021 ?REFERRING M.D.:  Lottie Mussel, MD ?Study Performed:   Baseline Polysomnogram ?HISTORY: 81 year old woman with a history of hypertension, hypothyroidism, left bundle branch block, DVT, hyperlipidemia, arthritis, asthma, MGUS, migraines, diabetes, and borderline obesity, who reports snoring and excessive daytime somnolence. The patient endorsed the Epworth Sleepiness Scale at 16 points. The patient's weight 191 pounds with a height of 65 (inches), resulting in a BMI of 32. kg/m2. The patient's neck circumference measured 15 5/8 inches. ? ?CURRENT MEDICATIONS: Ventolin HFA, Fosamax, Azor, Zithromax, Fasenra Pen, Symbicort, Tegretol XR, Zyrtec, Cymbalta, Jardiance, Wpinephrine, Nexium, Flonase, Apresoline, Atarax, Duoneb, Nizoral, Singulair, Deltasone, Crestor, Topamax, Kenalog, Drisdol, Microzide, ?  ?PROCEDURE:  This is a multichannel digital polysomnogram utilizing the Somnostar 11.2 system.  Electrodes and sensors were applied and monitored per AASM Specifications.   EEG, EOG, Chin and Limb EMG, were sampled at 200 Hz.  ECG, Snore and Nasal Pressure, Thermal Airflow, Respiratory Effort, CPAP Flow and Pressure, Oximetry was sampled at 50 Hz. Digital video and audio were recorded.     ? ?BASELINE STUDY ? ?Lights Out was at 21:33 and Lights On at 05:05.  Total recording time (TRT) was 452 minutes, with a total sleep time (TST) of 313.5 minutes.   The patient's sleep latency was 11 minutes.  REM latency was 48 minutes.  The sleep efficiency was 69.4 %.  ?   ?SLEEP ARCHITECTURE: WASO (Wake after sleep onset) was 127.5 minutes with mild sleep fragmentation noted and one longer period of wakefulness.  There were 15 minutes in Stage N1, 174 minutes Stage N2, 78.5 minutes Stage N3 and 46 minutes in Stage REM.  The percentage of Stage N1 was 4.8%, Stage N2 was 55.5%, Stage N3 was 25.% and Stage R (REM sleep)  was 14.7%, which is mildly reduced. The arousals were noted as: 44 were spontaneous, 0 were associated with PLMs, 1 were associated with respiratory events. ? ?RESPIRATORY ANALYSIS:  There were a total of 7 respiratory events:  0 obstructive apneas, 0 central apneas and 0 mixed apneas with a total of 0 apneas and an apnea index (AI) of 0 /hour. There were 7 hypopneas with a hypopnea index of 1.3 /hour. The patient also had 0 respiratory event related arousals (RERAs).  ?    ?The total APNEA/HYPOPNEA INDEX (AHI) was 1.3/hour and the total RESPIRATORY DISTURBANCE INDEX was  1.3 /hour.  6 events occurred in REM sleep and 2 events in NREM. The REM AHI was  7.8 /hour, versus a non-REM AHI of .2. The patient spent 76 minutes of total sleep time in the supine position and 238 minutes in non-supine.. The supine AHI was 2.4 versus a non-supine AHI of 1.0. ? ?OXYGEN SATURATION & C02:  The Wake baseline 02 saturation was 96%, with the lowest being 89%. Time spent below 89% saturation equaled 0 minutes. ?PERIODIC LIMB MOVEMENTS: The patient had a total of 0 Periodic Limb Movements.  The Periodic Limb Movement (PLM) index was 0 and the PLM Arousal index was 0/hour. ? ?Audio and video analysis did not show any abnormal or unusual movements, behaviors, phonations or vocalizations. The patient took no bathroom breaks. Mild snoring was noted. The EKG showed no evidence of atrial fibrillation.  ? ?Post-study, the patient indicated that sleep was better than usual.  ? ?IMPRESSION: ? ?Primary Snoring ?Dysfunctions associated with  sleep stages or arousal from sleep ? ?RECOMMENDATIONS: ? ?This study does not demonstrate any significant obstructive or central sleep disordered breathing with the exception of mild snoring and mild REM sleep related OSA. Treatment with a positive airway pressure device, such as CPAP or autoPAP is not indicated. Weight loss and avoiding the supine sleep position may aid in reducing her snoring and mild  stage-related sleep apnea.  ?This study shows sleep fragmentation and mildly abnormal sleep stage percentages; these are nonspecific findings and per se do not signify an intrinsic sleep disorder or a cause for the patient's sleep-related symptoms. Causes include (but are not limited to) the first night effect of the sleep study, circadian rhythm disturbances, medication effect or an underlying mood disorder or medical problem.  ?The patient should be cautioned not to drive, work at heights, or operate dangerous or heavy equipment when tired or sleepy. Review and reiteration of good sleep hygiene measures should be pursued with any patient. ?The patient will be advised to follow up with the referring provider, who will be notified of the test results. ? ?I certify that I have reviewed the entire raw data recording prior to the issuance of this report in accordance with the Standards of Accreditation of the [place] [employer] (AASM) ? ?Star Age, MD, PhD ?Diplomat, American Board of Neurology and Sleep Medicine (Neurology and Sleep Medicine) ? ? ? ?

## 2021-06-14 ENCOUNTER — Ambulatory Visit (INDEPENDENT_AMBULATORY_CARE_PROVIDER_SITE_OTHER): Payer: Medicare HMO | Admitting: Internal Medicine

## 2021-06-14 ENCOUNTER — Encounter: Payer: Self-pay | Admitting: Internal Medicine

## 2021-06-14 VITALS — BP 164/80 | HR 69 | Temp 98.2°F | Wt 196.6 lb

## 2021-06-14 DIAGNOSIS — Z1159 Encounter for screening for other viral diseases: Secondary | ICD-10-CM | POA: Diagnosis not present

## 2021-06-14 DIAGNOSIS — E1169 Type 2 diabetes mellitus with other specified complication: Secondary | ICD-10-CM | POA: Diagnosis not present

## 2021-06-14 DIAGNOSIS — Z9189 Other specified personal risk factors, not elsewhere classified: Secondary | ICD-10-CM

## 2021-06-14 DIAGNOSIS — J069 Acute upper respiratory infection, unspecified: Secondary | ICD-10-CM

## 2021-06-14 DIAGNOSIS — R051 Acute cough: Secondary | ICD-10-CM | POA: Diagnosis not present

## 2021-06-14 DIAGNOSIS — I10 Essential (primary) hypertension: Secondary | ICD-10-CM | POA: Diagnosis not present

## 2021-06-14 MED ORDER — PREDNISONE 5 MG PO TABS: ORAL_TABLET | ORAL | 0 refills | Status: AC

## 2021-06-14 MED ORDER — GUAIFENESIN ER 600 MG PO TB12: 600.0000 mg | ORAL_TABLET | Freq: Two times a day (BID) | ORAL | 0 refills | Status: AC

## 2021-06-14 MED ORDER — BENZONATATE 100 MG PO CAPS: 100.0000 mg | ORAL_CAPSULE | Freq: Three times a day (TID) | ORAL | 1 refills | Status: DC | PRN

## 2021-06-14 NOTE — Assessment & Plan Note (Addendum)
Will check BMP today. BP elevated in the setting of acute illness and steroids. Will need to readdress BP at f/u  Addendum: potassium slightly low at 3.0. Will order three days of oral potassium and contact patient to be seen in clinic again to recheck BMP - discussed this with patient who is in agreement

## 2021-06-14 NOTE — Progress Notes (Signed)
Internal Medicine Clinic Attending ° °Case discussed with Dr. DeMaio  At the time of the visit.  We reviewed the resident’s history and exam and pertinent patient test results.  I agree with the assessment, diagnosis, and plan of care documented in the resident’s note. ° ° °

## 2021-06-14 NOTE — Assessment & Plan Note (Signed)
A1c check today ?

## 2021-06-14 NOTE — Progress Notes (Signed)
? ?  CC: Cough ? ?HPI: ? ?[name] is a 81 y.o. PMH noted below, who presents to the [facility] with complaints of cough. To see the management of his acute and chronic conditions, please refer to the A&P note under the encounters tab.  ? ?Past Medical History:  ?Diagnosis Date  ? Arthritis   ? Asthma   ? Diabetes mellitus without complication ([place])   ? Diabetes II  ? DVT (deep venous thrombosis) (HCC)   ? "Years ago"  ? Dyslipidemia   ? Eczema   ? GERD (gastroesophageal reflux disease)   ? Headache   ? Migraine  ? Hypertension   ? Hypothyroidism   ? LBBB (left bundle branch block)   ? MGUS (monoclonal gammopathy of unknown significance)   ? MGUS (monoclonal gammopathy of unknown significance)   ? Migraine   ? OSA on CPAP   ? ?Review of Systems:  positive for sore throat, nasal congestion, cough, fatigue, wheezing; negative for shortness of breath, dizziness, lightheadedness, chest pain, fever, or chills ? ?Physical Exam: ?Gen: tired appearing elderly woman in NAD ?HEENT: normocephalic atraumatic, MMM ?CV: RRR, no m/r/g   ?Resp: wheezing throughout lung fields, normal WOB on room air, speaking in full sentences ?GI: soft, nontender ?MSK: moves all extremities without difficulty ?Skin:warm and dry ?Neuro:alert answering questions appropriately ?Psych: normal affect ? ? ?Assessment & Plan:  ? ?See Encounters Tab for problem based charting. ? ?Patient discussed with Dr. Cain Sieve  ? ?

## 2021-06-14 NOTE — Addendum Note (Signed)
Addended by: Truddie Crumble on: 06/14/2021 05:06 PM ? ? Modules accepted: Orders ? ?

## 2021-06-14 NOTE — Patient Instructions (Addendum)
Andrea Lowery ? ?It was a pleasure seeing you in the clinic today.  ? ?We talked about your cough which I think is caused by a virus. I am going to extend your steroid treatment course. You can also take mucinex to help thin your mucous and take tessalon perles to help reduce your cough.  ? ?If you have any lightheadedness, dizziness, or worsening shortness of breath please let us know.  ? ?Please call our clinic at [phone] if you have any questions or concerns. The best time to call is Monday-Friday from 9am-4pm, but there is someone available 24/7 at the same number. If you need medication refills, please notify your pharmacy one week in advance and they will send [place] a request. ?  ?Thank you for letting us take part in your care. We look forward to seeing you next time! ? ?

## 2021-06-14 NOTE — Assessment & Plan Note (Signed)
Patient recently seen at urgent care and the ED for an URI. She was given mucinex, doxycyline, and prednisone. Since going to the ED her congestion, sinus pressure, and sore throat have improved but she still is having a lot of cough especially at night. She has one day left of her prednisone burst. She was never tested for COVID however it has been over a week since she first came down with her cough and she is outside the Paxlovid window.  ? ?On exam she has diffuse wheezing throughout her lung fields, normal oxygen saturation, normal WOB on room air, and was able to ambulate without assistance into clinic. Peak expiratory flow is 170. Lower limit of normal for age and height is 127.  ? ?I think that she is improving as her symptoms are getting better besides the cough. Discussed with patient that the cough is often the last thing to improve. However given her significant underlying asthma and the wheezing appreciated on exam I think she could use an extension of her prednisone treatment. ?- finish prednisone 50 before tapering by 10 mg daily, total of 6 more days ?- return precautions discussed ?- continue using inhalers ?- mucinex, tessalon perles for symptom management ?

## 2021-06-14 NOTE — Progress Notes (Signed)
x

## 2021-06-15 ENCOUNTER — Ambulatory Visit: Payer: Medicare HMO

## 2021-06-15 LAB — BASIC METABOLIC PANEL
BUN/Creatinine Ratio: 11 — ABNORMAL LOW (ref 12–28)
BUN: 10 mg/dL (ref 8–27)
CO2: 25 mmol/L (ref 20–29)
Calcium: 9.3 mg/dL (ref 8.7–10.3)
Chloride: 100 mmol/L (ref 96–106)
Creatinine, Ser: 0.87 mg/dL (ref 0.57–1.00)
Glucose: 139 mg/dL — ABNORMAL HIGH (ref 70–99)
Potassium: 3 mmol/L — ABNORMAL LOW (ref 3.5–5.2)
Sodium: 139 mmol/L (ref 134–144)
eGFR: 68 mL/min/{1.73_m2} (ref >59)

## 2021-06-15 LAB — HCV AB W REFLEX TO QUANT PCR: HCV Ab: NONREACTIVE

## 2021-06-15 LAB — HEMOGLOBIN A1C
Est. average glucose Bld gHb Est-mCnc: 166 mg/dL
Hgb A1c MFr Bld: 7.4 % — ABNORMAL HIGH (ref 4.8–5.6)

## 2021-06-15 LAB — HCV INTERPRETATION

## 2021-06-18 ENCOUNTER — Other Ambulatory Visit: Payer: Self-pay | Admitting: Internal Medicine

## 2021-06-18 ENCOUNTER — Ambulatory Visit (INDEPENDENT_AMBULATORY_CARE_PROVIDER_SITE_OTHER): Payer: Medicare HMO

## 2021-06-18 ENCOUNTER — Telehealth: Payer: Self-pay | Admitting: *Deleted

## 2021-06-18 DIAGNOSIS — J4550 Severe persistent asthma, uncomplicated: Secondary | ICD-10-CM | POA: Diagnosis not present

## 2021-06-18 MED ORDER — POTASSIUM CHLORIDE CRYS ER 20 MEQ PO TBCR: 40.0000 meq | EXTENDED_RELEASE_TABLET | Freq: Every day | ORAL | 0 refills | Status: DC

## 2021-06-18 NOTE — Telephone Encounter (Signed)
-----   Message from Star Age, MD sent at 06/13/2021  6:28 PM EDT ----- ?Patient referred by PCP, seen by me on 04/30/21, diagnostic PSG on 06/03/21.   ?Please call and notify the patient that the recent sleep study did not show any significant obstructive sleep apnea with the exception of mild snoring and mild REM sleep related OSA. Treatment with a positive airway pressure device, such as CPAP or autoPAP is not indicated. Weight loss and avoiding the supine sleep position may aid in reducing her snoring and mild dream sleep related sleep apnea.  ?She can follow-up with PCP.  ? ?Thanks, ? ?Star Age, MD, PhD ?[facility]) ? ?

## 2021-06-18 NOTE — Telephone Encounter (Signed)
Tried to reach pt to discuss sleep study. Phone rang multiple times, no answer. Will try again later.  ?

## 2021-06-18 NOTE — Addendum Note (Signed)
Addended by: Inda Coke on: 06/18/2021 02:53 PM ? ? Modules accepted: Orders ? ?

## 2021-06-18 NOTE — Telephone Encounter (Signed)
-----   Message from Star Age, MD sent at 06/13/2021  6:28 PM EDT ----- ?Patient referred by PCP, seen by me on 04/30/21, diagnostic PSG on 06/03/21.   ?Please call and notify the patient that the recent sleep study did not show any significant obstructive sleep apnea with the exception of mild snoring and mild REM sleep related OSA. Treatment with a positive airway pressure device, such as CPAP or autoPAP is not indicated. Weight loss and avoiding the supine sleep position may aid in reducing her snoring and mild dream sleep related sleep apnea.  ?She can follow-up with PCP.  ? ?Thanks, ? ?Star Age, MD, PhD ?[facility]) ? ?

## 2021-06-18 NOTE — Telephone Encounter (Signed)
Spoke with patient gave  sleep study results Gave patient Dr Tori Milks recommendation Informed patient to follow up with her PCP . Pt expressed understanding .  ?

## 2021-06-19 NOTE — Addendum Note (Signed)
Addended by: Inda Coke on: 06/19/2021 10:21 AM ? ? Modules accepted: Orders ? ?

## 2021-06-26 ENCOUNTER — Ambulatory Visit (INDEPENDENT_AMBULATORY_CARE_PROVIDER_SITE_OTHER): Payer: Medicare HMO

## 2021-06-26 DIAGNOSIS — J309 Allergic rhinitis, unspecified: Secondary | ICD-10-CM | POA: Diagnosis not present

## 2021-06-27 ENCOUNTER — Other Ambulatory Visit: Payer: Self-pay | Admitting: Internal Medicine

## 2021-06-27 NOTE — Telephone Encounter (Signed)
Call to patient's Pharmacy [place].  Prescription for the Hydralazine was sent out on 06/25/2021.  Unable to reach patient per phone or to leave a message that medication has been sent.

## 2021-06-28 ENCOUNTER — Other Ambulatory Visit (INDEPENDENT_AMBULATORY_CARE_PROVIDER_SITE_OTHER): Payer: Medicare HMO

## 2021-06-28 DIAGNOSIS — I10 Essential (primary) hypertension: Secondary | ICD-10-CM

## 2021-06-29 ENCOUNTER — Telehealth: Payer: Self-pay | Admitting: Internal Medicine

## 2021-06-29 LAB — BASIC METABOLIC PANEL
BUN/Creatinine Ratio: 7 — ABNORMAL LOW (ref 12–28)
BUN: 6 mg/dL — ABNORMAL LOW (ref 8–27)
CO2: 19 mmol/L — ABNORMAL LOW (ref 20–29)
Calcium: 9.4 mg/dL (ref 8.7–10.3)
Chloride: 101 mmol/L (ref 96–106)
Creatinine, Ser: 0.82 mg/dL (ref 0.57–1.00)
Glucose: 172 mg/dL — ABNORMAL HIGH (ref 70–99)
Potassium: 4.3 mmol/L (ref 3.5–5.2)
Sodium: 137 mmol/L (ref 134–144)
eGFR: 73 mL/min/{1.73_m2} (ref >59)

## 2021-06-29 NOTE — Telephone Encounter (Signed)
Pt requesting a call back.  Pt states she read her My Chart Message and was to call in about her Test Results from 06/28/2021.

## 2021-07-03 ENCOUNTER — Encounter: Payer: Self-pay | Admitting: Internal Medicine

## 2021-07-04 ENCOUNTER — Telehealth: Payer: Self-pay

## 2021-07-04 ENCOUNTER — Ambulatory Visit (INDEPENDENT_AMBULATORY_CARE_PROVIDER_SITE_OTHER): Payer: Medicare HMO | Admitting: Dermatology

## 2021-07-04 ENCOUNTER — Ambulatory Visit (INDEPENDENT_AMBULATORY_CARE_PROVIDER_SITE_OTHER): Payer: Medicare HMO

## 2021-07-04 DIAGNOSIS — H02726 Madarosis of left eye, unspecified eyelid and periocular area: Secondary | ICD-10-CM

## 2021-07-04 DIAGNOSIS — L700 Acne vulgaris: Secondary | ICD-10-CM

## 2021-07-04 DIAGNOSIS — R208 Other disturbances of skin sensation: Secondary | ICD-10-CM

## 2021-07-04 DIAGNOSIS — H02723 Madarosis of right eye, unspecified eyelid and periocular area: Secondary | ICD-10-CM | POA: Diagnosis not present

## 2021-07-04 DIAGNOSIS — J309 Allergic rhinitis, unspecified: Secondary | ICD-10-CM | POA: Diagnosis not present

## 2021-07-04 DIAGNOSIS — L729 Follicular cyst of the skin and subcutaneous tissue, unspecified: Secondary | ICD-10-CM

## 2021-07-04 MED ORDER — DAPSONE 5 % EX GEL: 1.0000 "application " | Freq: Every evening | CUTANEOUS | 4 refills | Status: DC

## 2021-07-04 MED ORDER — BIMATOPROST 0.03 % EX SOLN: 1.0000 | Freq: Every day | CUTANEOUS | 4 refills | Status: DC

## 2021-07-04 NOTE — Telephone Encounter (Signed)
Patient came in for allergy injection and is requesting for an eye drop for itching eyes. She states Pataday is too expensive  I didn't see anything in her medication list.

## 2021-07-09 ENCOUNTER — Telehealth: Payer: Self-pay | Admitting: *Deleted

## 2021-07-09 NOTE — Telephone Encounter (Signed)
Andrea Lowery (Key: [id number])  Your information has been sent to [facility].    Your information has been submitted to [facility]. [employer] will review the request and will issue a decision, typically within 3-7 days from your submission. You can check the updated outcome later by reopening this request.  If [employer] has not responded in 3-7 days or if you have any questions about your ePA request, please contact [employer] at [phone]. If you think there may be a problem with your PA request, use our live chat feature at the bottom right.  Prior authorization for dapsone gel done via cover my meds.

## 2021-07-10 ENCOUNTER — Telehealth: Payer: Self-pay | Admitting: *Deleted

## 2021-07-10 NOTE — Telephone Encounter (Signed)
Prior authorization received for Bimatoprost 0.03 % solution- done via cover my meds.   Andrea Lowery (Key: [id number])  Your information has been sent to [facility].

## 2021-07-12 ENCOUNTER — Other Ambulatory Visit: Payer: Self-pay | Admitting: *Deleted

## 2021-07-12 DIAGNOSIS — D472 Monoclonal gammopathy: Secondary | ICD-10-CM

## 2021-07-13 ENCOUNTER — Other Ambulatory Visit: Payer: Self-pay

## 2021-07-13 ENCOUNTER — Ambulatory Visit (INDEPENDENT_AMBULATORY_CARE_PROVIDER_SITE_OTHER): Payer: Medicare HMO

## 2021-07-13 ENCOUNTER — Inpatient Hospital Stay: Payer: Medicare HMO | Attending: Hematology and Oncology

## 2021-07-13 DIAGNOSIS — Z7951 Long term (current) use of inhaled steroids: Secondary | ICD-10-CM | POA: Insufficient documentation

## 2021-07-13 DIAGNOSIS — J309 Allergic rhinitis, unspecified: Secondary | ICD-10-CM

## 2021-07-13 DIAGNOSIS — I10 Essential (primary) hypertension: Secondary | ICD-10-CM | POA: Diagnosis not present

## 2021-07-13 DIAGNOSIS — Z7984 Long term (current) use of oral hypoglycemic drugs: Secondary | ICD-10-CM | POA: Insufficient documentation

## 2021-07-13 DIAGNOSIS — Z7952 Long term (current) use of systemic steroids: Secondary | ICD-10-CM | POA: Insufficient documentation

## 2021-07-13 DIAGNOSIS — D472 Monoclonal gammopathy: Secondary | ICD-10-CM | POA: Insufficient documentation

## 2021-07-13 DIAGNOSIS — Z79899 Other long term (current) drug therapy: Secondary | ICD-10-CM | POA: Diagnosis not present

## 2021-07-13 LAB — CMP (CANCER CENTER ONLY)
ALT: 26 U/L (ref 0–44)
AST: 31 U/L (ref 15–41)
Albumin: 4 g/dL (ref 3.5–5.0)
Alkaline Phosphatase: 74 U/L (ref 38–126)
Anion gap: 6 (ref 5–15)
BUN: 8 mg/dL (ref 8–23)
CO2: 28 mmol/L (ref 22–32)
Calcium: 9.4 mg/dL (ref 8.9–10.3)
Chloride: 106 mmol/L (ref 98–111)
Creatinine: 0.78 mg/dL (ref 0.44–1.00)
GFR, Estimated: >60 mL/min (ref >60)
Glucose, Bld: 158 mg/dL — ABNORMAL HIGH (ref 70–99)
Potassium: 3.4 mmol/L — ABNORMAL LOW (ref 3.5–5.1)
Sodium: 140 mmol/L (ref 135–145)
Total Bilirubin: 0.4 mg/dL (ref 0.3–1.2)
Total Protein: 7.2 g/dL (ref 6.5–8.1)

## 2021-07-13 LAB — CBC WITH DIFFERENTIAL (CANCER CENTER ONLY)
Abs Immature Granulocytes: 0.01 10*3/uL (ref 0.00–0.07)
Basophils Absolute: 0 10*3/uL (ref 0.0–0.1)
Basophils Relative: 0 %
Eosinophils Absolute: 0 10*3/uL (ref 0.0–0.5)
Eosinophils Relative: 0 %
HCT: 38.5 % (ref 36.0–46.0)
Hemoglobin: 13.1 g/dL (ref 12.0–15.0)
Immature Granulocytes: 0 %
Lymphocytes Relative: 35 %
Lymphs Abs: 1.4 10*3/uL (ref 0.7–4.0)
MCH: 27.5 pg (ref 26.0–34.0)
MCHC: 34 g/dL (ref 30.0–36.0)
MCV: 80.9 fL (ref 80.0–100.0)
Monocytes Absolute: 0.4 10*3/uL (ref 0.1–1.0)
Monocytes Relative: 11 %
Neutro Abs: 2.2 10*3/uL (ref 1.7–7.7)
Neutrophils Relative %: 54 %
Platelet Count: 203 10*3/uL (ref 150–400)
RBC: 4.76 MIL/uL (ref 3.87–5.11)
RDW: 14 % (ref 11.5–15.5)
WBC Count: 4 10*3/uL (ref 4.0–10.5)
nRBC: 0 % (ref 0.0–0.2)

## 2021-07-16 LAB — KAPPA/LAMBDA LIGHT CHAINS
Kappa free light chain: 59.6 mg/L — ABNORMAL HIGH (ref 3.3–19.4)
Kappa, lambda light chain ratio: 4.62 — ABNORMAL HIGH (ref 0.26–1.65)
Lambda free light chains: 12.9 mg/L (ref 5.7–26.3)

## 2021-07-18 ENCOUNTER — Other Ambulatory Visit: Payer: Self-pay | Admitting: Internal Medicine

## 2021-07-18 LAB — MULTIPLE MYELOMA PANEL, SERUM
Albumin SerPl Elph-Mcnc: 3.4 g/dL (ref 2.9–4.4)
Albumin/Glob SerPl: 1.1 (ref 0.7–1.7)
Alpha 1: 0.2 g/dL (ref 0.0–0.4)
Alpha2 Glob SerPl Elph-Mcnc: 0.6 g/dL (ref 0.4–1.0)
B-Globulin SerPl Elph-Mcnc: 1.1 g/dL (ref 0.7–1.3)
Gamma Glob SerPl Elph-Mcnc: 1.4 g/dL (ref 0.4–1.8)
Globulin, Total: 3.4 g/dL (ref 2.2–3.9)
IgA: 163 mg/dL (ref 64–422)
IgG (Immunoglobin G), Serum: 1488 mg/dL (ref 586–1602)
IgM (Immunoglobulin M), Srm: 37 mg/dL (ref 26–217)
M Protein SerPl Elph-Mcnc: 0.9 g/dL — ABNORMAL HIGH
Total Protein ELP: 6.8 g/dL (ref 6.0–8.5)

## 2021-07-20 ENCOUNTER — Inpatient Hospital Stay (HOSPITAL_BASED_OUTPATIENT_CLINIC_OR_DEPARTMENT_OTHER): Payer: Medicare HMO | Admitting: Hematology and Oncology

## 2021-07-20 ENCOUNTER — Encounter: Payer: Self-pay | Admitting: Hematology and Oncology

## 2021-07-20 ENCOUNTER — Ambulatory Visit (INDEPENDENT_AMBULATORY_CARE_PROVIDER_SITE_OTHER): Payer: Medicare HMO | Admitting: *Deleted

## 2021-07-20 ENCOUNTER — Other Ambulatory Visit: Payer: Self-pay

## 2021-07-20 VITALS — BP 179/76 | HR 78 | Temp 98.6°F | Resp 16 | Ht 65.0 in | Wt 190.4 lb

## 2021-07-20 DIAGNOSIS — D472 Monoclonal gammopathy: Secondary | ICD-10-CM

## 2021-07-20 DIAGNOSIS — I10 Essential (primary) hypertension: Secondary | ICD-10-CM

## 2021-07-20 DIAGNOSIS — J309 Allergic rhinitis, unspecified: Secondary | ICD-10-CM

## 2021-07-20 NOTE — Progress Notes (Signed)
[facility] OFFICE PROGRESS NOTE  Patient Care Team: Timothy Lasso, MD as PCP - General (Internal Medicine) Lavonna Monarch, MD as Consulting Physician (Dermatology)  ASSESSMENT & PLAN:  MGUS (monoclonal gammopathy of unknown significance) Clinically, she has no signs of disease progression. Her myeloma panel is stable/better compared to previous years I will continue her yearly visit We discussed briefly the natural history of myeloma  Essential hypertension Her blood pressure is high The patient stated she is taking all her medications as prescribed I would defer to her primary care doctor for further management  Orders Placed This Encounter  Procedures   CMP ([place] only)    Standing Status:   Future    Standing Expiration Date:   07/21/2022   CBC with Differential ([facility] Only)    Standing Status:   Future    Standing Expiration Date:   07/21/2022   Kappa/lambda light chains    Standing Status:   Future    Standing Expiration Date:   07/21/2022   Multiple Myeloma Panel (SPEP&IFE w/QIG)    Standing Status:   Future    Standing Expiration Date:   07/21/2022    All questions were answered. The patient knows to call the clinic with any problems, questions or concerns. The total time spent in the appointment was 20 minutes encounter with patients including review of chart and various tests results, discussions about plan of care and coordination of care plan   Heath Lark, MD 07/20/2021 12:31 PM  INTERVAL HISTORY: Please see below for problem oriented charting. she returns for treatment follow-up with family She is doing well No recent infection No new bone pain We have spent majority of the time reviewing test results  REVIEW OF SYSTEMS:   Constitutional: Denies fevers, chills or abnormal weight loss Eyes: Denies blurriness of vision Ears, nose, mouth, throat, and face: Denies mucositis or sore throat Respiratory: Denies cough, dyspnea or  wheezes Cardiovascular: Denies palpitation, chest discomfort or lower extremity swelling Gastrointestinal:  Denies nausea, heartburn or change in bowel habits Skin: Denies abnormal skin rashes Lymphatics: Denies new lymphadenopathy or easy bruising Neurological:Denies numbness, tingling or new weaknesses Behavioral/Psych: Mood is stable, no new changes  All other systems were reviewed with the patient and are negative.  I have reviewed the past medical history, past surgical history, social history and family history with the patient and they are unchanged from previous note.  ALLERGIES:  is allergic to bee venom and penicillins.  MEDICATIONS:  Current Outpatient Medications  Medication Sig Dispense Refill   albuterol (VENTOLIN HFA) 108 (90 Base) MCG/ACT inhaler Inhale 2 puffs into the lungs every 4 (four) hours as needed for wheezing or shortness of breath. 18 g 1   Alcohol Swabs (DROPSAFE ALCOHOL PREP) 70 % PADS USE  TOPICALLY AS NEEDED 100 each 0   alendronate (FOSAMAX) 70 MG tablet Take 70 mg by mouth every Wednesday.      amLODipine-olmesartan (AZOR) 10-40 MG tablet Take 1 tablet by mouth daily. 90 tablet 3   azithromycin (ZITHROMAX) 500 MG tablet Take 1 tablet (500 mg total) by mouth daily. 3 tablet 0   Benralizumab (FASENRA PEN) 30 MG/ML SOAJ Inject 1 mL (30 mg total) into the skin every 8 (eight) weeks. 1 mL 6   benzonatate (TESSALON PERLES) 100 MG capsule Take 1 capsule (100 mg total) by mouth 3 (three) times daily as needed for cough. 30 capsule 1   bimatoprost (LATISSE) 0.03 % ophthalmic solution Place 1 applicator  into both eyes at bedtime. Place one drop on applicator and apply evenly along the skin of the upper eyelid at base of eyelashes once daily at bedtime; repeat procedure for second eye (use a clean applicator). 5 mL 4   Blood Glucose Monitoring Suppl (TRUE METRIX METER) w/Device KIT Use as instructed 1 kit 1   budesonide-formoterol (SYMBICORT) 160-4.5 MCG/ACT inhaler  INHALE 2 PUFFS BY MOUTH INTO THE LUNGS TWICE DAILY RINSE GARGLE AND SPIT AFTER USE 10.2 g 0   carbamazepine (TEGRETOL XR) 200 MG 12 hr tablet Take 1 tablet (200 mg total) by mouth daily. 30 tablet 0   Dapsone 5 % topical gel Apply 1 application. topically at bedtime. 60 g 4   doxycycline (VIBRAMYCIN) 100 MG capsule Take 1 capsule (100 mg total) by mouth 2 (two) times daily. 20 capsule 0   DULoxetine (CYMBALTA) 30 MG capsule TAKE 1 CAPSULE EVERY DAY 30 capsule 0   empagliflozin (JARDIANCE) 10 MG TABS tablet Take 1 tablet (10 mg total) by mouth daily. 90 tablet 3   EPINEPHrine 0.3 mg/0.3 mL IJ SOAJ injection Use as directed for severe allergic reaction 2 each 1   esomeprazole (NEXIUM) 40 MG capsule Take 1 capsule (40 mg total) by mouth 2 (two) times daily. 180 capsule 1   fluticasone (FLONASE) 50 MCG/ACT nasal spray Place 2 sprays into both nostrils daily. 48 g 1   glucose blood (TRUE METRIX BLOOD GLUCOSE TEST) test strip Use as instructed 100 each 12   hydrALAZINE (APRESOLINE) 10 MG tablet TAKE 1 TABLET THREE TIMES DAILY 30 tablet 0   hydrochlorothiazide (MICROZIDE) 12.5 MG capsule Take 1 capsule (12.5 mg total) by mouth daily. 30 capsule 2   hydrOXYzine (ATARAX) 10 MG tablet TAKE 1 TABLET THREE TIMES DAILY AS NEEDED FOR  ITCHING 30 tablet 1   ipratropium-albuterol (DUONEB) 0.5-2.5 (3) MG/3ML SOLN Take 3 mLs by nebulization every 6 (six) hours as needed. 360 mL 2   ketoconazole (NIZORAL) 2 % cream      levothyroxine (SYNTHROID) 50 MCG tablet Take 1 tablet (50 mcg total) by mouth daily. 90 tablet 0   metoprolol succinate (TOPROL-XL) 25 MG 24 hr tablet Take 1 tablet (25 mg total) by mouth daily. PATIENT MUST SCHEDULE APPOINTMENT FOR FUTURE REFILLS. SECOND ATTEMPT. 30 tablet 2   montelukast (SINGULAIR) 10 MG tablet Take 1 tablet (10 mg total) by mouth at bedtime. 90 tablet 1   potassium chloride SA (KLOR-CON M) 20 MEQ tablet Take 2 tablets (40 mEq total) by mouth daily for 3 days. 6 tablet 0    predniSONE (DELTASONE) 50 MG tablet One tablet a day 5 tablet 0   rosuvastatin (CRESTOR) 40 MG tablet TAKE 1 TABLET(40 MG) BY MOUTH DAILY 90 tablet 0   topiramate (TOPAMAX) 50 MG tablet Take 1 tablet (50 mg total) by mouth daily. 90 tablet 3   triamcinolone cream (KENALOG) 0.1 % Apply 1 application topically daily. 80 g 2   TRUEplus Lancets 33G MISC USE AS DIRECTED 100 each 0   Vitamin D, Ergocalciferol, (DRISDOL) 1.25 MG (50000 UNIT) CAPS capsule Take 50,000 Units by mouth every 7 (seven) days. Pt takes on Wednesday of each week     Current Facility-Administered Medications  Medication Dose Route Frequency Provider Last Rate Last Admin   Benralizumab SOSY 30 mg  30 mg Subcutaneous Q8 Weeks Kozlow, Donnamarie Poag, MD   30 mg at 06/18/21 1401    SUMMARY OF ONCOLOGIC HISTORY: Oncology History   No history exists.  PHYSICAL EXAMINATION: ECOG PERFORMANCE STATUS: 0 - Asymptomatic  Vitals:   07/20/21 1059  BP: (!) 179/76  Pulse: 78  Resp: 16  Temp: 98.6 F (37 C)  SpO2: 97%   Filed Weights   07/20/21 1059  Weight: 190 lb 6.4 oz (86.4 kg)    GENERAL:alert, no distress and comfortable  NEURO: alert & oriented x 3 with fluent speech, no focal motor/sensory deficits  LABORATORY DATA:  I have reviewed the data as listed    Component Value Date/Time   NA 140 07/13/2021 1057   NA 137 06/28/2021 1134   NA 140 04/23/2016 1303   K 3.4 (L) 07/13/2021 1057   K 2.8 (LL) 04/23/2016 1303   CL 106 07/13/2021 1057   CO2 28 07/13/2021 1057   CO2 28 04/23/2016 1303   GLUCOSE 158 (H) 07/13/2021 1057   GLUCOSE 156 (H) 04/23/2016 1303   BUN 8 07/13/2021 1057   BUN 6 (L) 06/28/2021 1134   BUN 6.9 (L) 04/23/2016 1303   CREATININE 0.78 07/13/2021 1057   CREATININE 0.9 04/23/2016 1303   CALCIUM 9.4 07/13/2021 1057   CALCIUM 9.7 04/23/2016 1303   PROT 7.2 07/13/2021 1057   PROT 7.5 11/22/2020 1604   PROT 8.0 04/23/2016 1303   ALBUMIN 4.0 07/13/2021 1057   ALBUMIN 4.4 11/22/2020 1604    ALBUMIN 3.9 04/23/2016 1303   AST 31 07/13/2021 1057   AST 19 04/23/2016 1303   ALT 26 07/13/2021 1057   ALT 24 04/23/2016 1303   ALKPHOS 74 07/13/2021 1057   ALKPHOS 65 04/23/2016 1303   BILITOT 0.4 07/13/2021 1057   BILITOT 0.53 04/23/2016 1303   GFRNONAA >60 07/13/2021 1057   GFRAA >60 02/02/2019 0339   GFRAA >60 05/29/2018 1141    No results found for: "SPEP", "UPEP"  Lab Results  Component Value Date   WBC 4.0 07/13/2021   NEUTROABS 2.2 07/13/2021   HGB 13.1 07/13/2021   HCT 38.5 07/13/2021   MCV 80.9 07/13/2021   PLT 203 07/13/2021      Chemistry      Component Value Date/Time   NA 140 07/13/2021 1057   NA 137 06/28/2021 1134   NA 140 04/23/2016 1303   K 3.4 (L) 07/13/2021 1057   K 2.8 (LL) 04/23/2016 1303   CL 106 07/13/2021 1057   CO2 28 07/13/2021 1057   CO2 28 04/23/2016 1303   BUN 8 07/13/2021 1057   BUN 6 (L) 06/28/2021 1134   BUN 6.9 (L) 04/23/2016 1303   CREATININE 0.78 07/13/2021 1057   CREATININE 0.9 04/23/2016 1303      Component Value Date/Time   CALCIUM 9.4 07/13/2021 1057   CALCIUM 9.7 04/23/2016 1303   ALKPHOS 74 07/13/2021 1057   ALKPHOS 65 04/23/2016 1303   AST 31 07/13/2021 1057   AST 19 04/23/2016 1303   ALT 26 07/13/2021 1057   ALT 24 04/23/2016 1303   BILITOT 0.4 07/13/2021 1057   BILITOT 0.53 04/23/2016 1303

## 2021-07-20 NOTE — Assessment & Plan Note (Signed)
Clinically, she has no signs of disease progression. Her myeloma panel is stable/better compared to previous years I will continue her yearly visit We discussed briefly the natural history of myeloma

## 2021-07-20 NOTE — Assessment & Plan Note (Signed)
Her blood pressure is high The patient stated she is taking all her medications as prescribed I would defer to her primary care doctor for further management

## 2021-07-24 ENCOUNTER — Other Ambulatory Visit: Payer: Self-pay

## 2021-07-24 ENCOUNTER — Ambulatory Visit (INDEPENDENT_AMBULATORY_CARE_PROVIDER_SITE_OTHER): Payer: Medicare HMO

## 2021-07-24 ENCOUNTER — Ambulatory Visit (INDEPENDENT_AMBULATORY_CARE_PROVIDER_SITE_OTHER): Payer: Medicare HMO | Admitting: Student

## 2021-07-24 ENCOUNTER — Encounter: Payer: Self-pay | Admitting: Student

## 2021-07-24 VITALS — BP 175/88 | HR 77 | Temp 98.3°F | Resp 24 | Ht 65.0 in | Wt 193.6 lb

## 2021-07-24 DIAGNOSIS — D472 Monoclonal gammopathy: Secondary | ICD-10-CM | POA: Diagnosis not present

## 2021-07-24 DIAGNOSIS — I10 Essential (primary) hypertension: Secondary | ICD-10-CM

## 2021-07-24 DIAGNOSIS — J309 Allergic rhinitis, unspecified: Secondary | ICD-10-CM

## 2021-07-24 DIAGNOSIS — E039 Hypothyroidism, unspecified: Secondary | ICD-10-CM | POA: Diagnosis not present

## 2021-07-24 DIAGNOSIS — R350 Frequency of micturition: Secondary | ICD-10-CM

## 2021-07-24 DIAGNOSIS — F32A Depression, unspecified: Secondary | ICD-10-CM

## 2021-07-24 DIAGNOSIS — F418 Other specified anxiety disorders: Secondary | ICD-10-CM

## 2021-07-24 DIAGNOSIS — E876 Hypokalemia: Secondary | ICD-10-CM

## 2021-07-24 DIAGNOSIS — F419 Anxiety disorder, unspecified: Secondary | ICD-10-CM

## 2021-07-24 DIAGNOSIS — Z87891 Personal history of nicotine dependence: Secondary | ICD-10-CM

## 2021-07-24 LAB — POCT URINALYSIS DIPSTICK
Bilirubin, UA: NEGATIVE
Blood, UA: NEGATIVE
Glucose, UA: NEGATIVE
Ketones, UA: NEGATIVE
Leukocytes, UA: NEGATIVE
Nitrite, UA: NEGATIVE
Protein, UA: NEGATIVE
Spec Grav, UA: <=1.005 — AB (ref 1.010–1.025)
Urobilinogen, UA: 0.2 E.U./dL
pH, UA: 5.5 (ref 5.0–8.0)

## 2021-07-24 MED ORDER — SPIRONOLACTONE 25 MG PO TABS: 25.0000 mg | ORAL_TABLET | Freq: Every day | ORAL | 11 refills | Status: DC

## 2021-07-24 MED ORDER — DULOXETINE HCL 30 MG PO CPEP: 60.0000 mg | ORAL_CAPSULE | Freq: Every day | ORAL | 2 refills | Status: DC

## 2021-07-24 MED ORDER — METOPROLOL SUCCINATE ER 50 MG PO TB24: 50.0000 mg | ORAL_TABLET | Freq: Every day | ORAL | 11 refills | Status: DC

## 2021-07-24 NOTE — Patient Instructions (Addendum)
Thank you, Ms.[name] for allowing [place] to provide your care today. Today we discussed, your blood pressure, depression and MGUS.  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  For your blood pressure, it is still significantly elevated. I have made some changes to your medications as below.  Please check your blood pressure daily at home and bring the log with you to clinic in 2 weeks.  For the depression/grief, you can call [facility] at [phone] for therapy and grief counseling.  I have ordered the following labs for you:   Lab Orders         BMP8+Anion Gap         Magnesium         TSH         POCT Urinalysis Dipstick (50037)      I will call if any are abnormal. All of your labs can be accessed through "My Chart".   I have ordered the following medication/changed the following medications:  Start spironolactone 25 mg daily Increase metoprolol from 25 mg to 50 mg daily Discontinue HCTZ 12.5 mg daily Discontinue hydralazine 10 mg 3 times daily  My Chart Access: [email or web address]  Please follow-up in 2 weeks  Please make sure to arrive 15 minutes prior to your next appointment. If you arrive late, you may be asked to reschedule.    We look forward to seeing you next time. Please call our clinic at [phone] if you have any questions or concerns. The best time to call is Monday-Friday from 9am-4pm, but there is someone available 24/7. If after hours or the weekend, call the main hospital number and ask for the Internal Medicine Resident On-Call. If you need medication refills, please notify your pharmacy one week in advance and they will send [place] a request.   Thank you for letting us take part in your care. Wishing you the best!  Lacinda Axon, MD 07/24/2021, 3:20 PM IM Resident, PGY-2 [facility] 41:10

## 2021-07-24 NOTE — Progress Notes (Unsigned)
   CC: Follow-up  HPI:  [name] is a 81 y.o. female with PMH as below who presents to clinic to follow-up on her chronic medical problems. Please see problem based charting for evaluation, assessment and plan.  Past Medical History:  Diagnosis Date   Arthritis    Asthma    Diabetes mellitus without complication ([place])    Diabetes II   DVT (deep venous thrombosis) ([place])    "Years ago"   Dyslipidemia    Eczema    GERD (gastroesophageal reflux disease)    Headache    Migraine   Hypertension    Hypothyroidism    LBBB (left bundle branch block)    MGUS (monoclonal gammopathy of unknown significance)    MGUS (monoclonal gammopathy of unknown significance)    Migraine    OSA on CPAP     Review of Systems:  Constitutional: Negative for fever or fatigue Eyes: Negative for visual changes Respiratory: Negative for shortness of breath Cardiac: Negative for chest pain Skin: Positive for itching and hair loss.  Negative for rash. Neuro: Negative for headache, dizziness or weakness Psych: Positive for depression and grief  Physical Exam: General: Pleasant, well-appearing elderly female. No acute distress. Eyes: Hair loss on the eyebrows and eyelids.  Cardiac: RRR. No murmurs, rubs or gallops. No LE edema Respiratory: Lungs CTAB. No wheezing or crackles. Abdominal: Soft, symmetric and non tender. Normal BS. Skin: Warm, dry and intact without rashes or lesions Extremities: Atraumatic. Full ROM. Palpable radial and DP pulses. Neuro: A&O x 3. Moves all extremities. Normal sensation to gross touch. Psych: Appropriate mood and affect.  Vitals:   07/24/21 1415 07/24/21 1425 07/24/21 1503  BP: (!) 170/87 (!) 170/102 (!) 175/88  Pulse: 92 85 77  Resp: (!) 24    Temp: 98.3 F (36.8 C)    TempSrc: Oral    SpO2: 97%    Weight: 193 lb 9.6 oz (87.8 kg)    Height: '5\' 5"'$  (1.651 m)      Assessment & Plan:   See Encounters Tab for problem based charting.  Patient  discussed with Dr. Lorenz Coaster, MD, MPH

## 2021-07-25 ENCOUNTER — Other Ambulatory Visit: Payer: Self-pay | Admitting: Allergy and Immunology

## 2021-07-25 ENCOUNTER — Encounter: Payer: Self-pay | Admitting: Student

## 2021-07-25 DIAGNOSIS — R350 Frequency of micturition: Secondary | ICD-10-CM | POA: Insufficient documentation

## 2021-07-25 HISTORY — DX: Frequency of micturition: R35.0

## 2021-07-25 LAB — BMP8+ANION GAP
Anion Gap: 13 mmol/L (ref 10.0–18.0)
BUN/Creatinine Ratio: 4 — ABNORMAL LOW (ref 12–28)
BUN: 3 mg/dL — ABNORMAL LOW (ref 8–27)
CO2: 24 mmol/L (ref 20–29)
Calcium: 9.2 mg/dL (ref 8.7–10.3)
Chloride: 104 mmol/L (ref 96–106)
Creatinine, Ser: 0.76 mg/dL (ref 0.57–1.00)
Glucose: 104 mg/dL — ABNORMAL HIGH (ref 70–99)
Potassium: 4 mmol/L (ref 3.5–5.2)
Sodium: 141 mmol/L (ref 134–144)
eGFR: 80 mL/min/{1.73_m2} (ref >59)

## 2021-07-25 LAB — MAGNESIUM: Magnesium: 2.2 mg/dL (ref 1.6–2.3)

## 2021-07-25 LAB — TSH: TSH: 2.35 u[IU]/mL (ref 0.450–4.500)

## 2021-07-25 NOTE — Assessment & Plan Note (Signed)
TSH normal today at 2.35. -Continue Synthroid 50 mcg daily

## 2021-07-25 NOTE — Assessment & Plan Note (Signed)
Patient with PHQ-9 score of 15 today. Patient reports ongoing sadness and grief due to this week being the 1 year anniversary of her grandson passing away. Patient states her grandson which she was close to was killed in a car accident 1 year ago. His birthday is this Thursday and she has been very sad and has had difficulty sleeping. She recently spoke with a therapist at the cancer center but did not find it helpful. She was started on Cymbalta 30 mg recently. Thinks she is taking it but it has not really helped with her depression. Patient's symptoms likely secondary to ongoing grief versus persistent depression.  Plan: -Increase Cymbalta to 60 mg daily -Provided number for [facility] for CBT and grief counseling -Consider mirtazapine or hydralazine at bedtime to help with sleep if symptoms have not improved by next office visit -Repeat PHQ-9 at next office visit

## 2021-07-25 NOTE — Assessment & Plan Note (Addendum)
Patient here for blood pressure follow-up.  Blood pressure still uncontrolled with SBP in the 170s.  Current blood pressure regimen includes amlodipine-olmesartan 10-40 mg daily, hydralazine 10 mg TID, metoprolol 25 mg daily and HCTZ 12.5 mg daily. Patient found to have hypokalemia on recent BMPs as low as 3.0 four weeks ago. Patient received potassium supplementation with resolution of her hypokalemia but repeat BMP 2 weeks ago showed mild drop in her potassium to 3.4.  Patient denies any headaches, blurry vision or dizziness but endorses increased urinary frequency.  Patient currently on 5 different antihypertensive regiment including a thiazide but has not had any work-up for hyperaldosteronism. Plan to start patient on spironolactone monitor blood pressure and BMP closely.  Patient advised to check blood pressure at home and bring log to office visit in 2 weeks.  Plan: -Start Aldactone 25 mg daily -Increase metoprolol succinate from 25 to 50 mg daily -Continue amlodipine-olmesartan 10-40 mg daily -Discontinue HCTZ and hydralazine -Follow-up in 2 weeks with BP log for repeat BP and BMP

## 2021-07-25 NOTE — Assessment & Plan Note (Addendum)
Resolved. BMP today showed potassium improved to 4.0.  Magnesium within normal limits. -Discontinue HCTZ and start spironolactone -Continue to monitor

## 2021-07-25 NOTE — Assessment & Plan Note (Signed)
Patient with a history of MGUS presented today with increased urination in the last 2 weeks. Patient denies any dysuria, hesitancy or burning with urination. He denies any flank or abdominal pain. Patient currently on a thiazide diuretic. BMP today shows normal kidney function and electrolytes. UA negative for UTI. -Discontinue HCTZ -Follow-up in 2 weeks for reevaluation

## 2021-07-25 NOTE — Assessment & Plan Note (Signed)
Follows with Dr. Alvy Bimler. Follow-up appointment with her on 6/16 showed patient has no signs of disease progression and her myeloma panel labs have remained stable. -Continue to follow-up with oncology

## 2021-07-26 NOTE — Progress Notes (Signed)
Internal Medicine Clinic Attending  Case discussed with the resident at the time of the visit.  We reviewed the resident's history and exam and pertinent patient test results.  I agree with the assessment, diagnosis, and plan of care documented in the resident's note.  

## 2021-07-27 ENCOUNTER — Encounter: Payer: Self-pay | Admitting: Dermatology

## 2021-07-27 NOTE — Progress Notes (Signed)
   Follow-Up Visit   Subjective  Andrea Lowery is a 81 y.o. female who presents for the following: Alopecia (Patient is feeling like her eyebrows are thinning. X months. Also Patient has some acne bumps on face she wants checked. ).  Skin check, multiple areas of concern Location:  Duration:  Quality:  Associated Signs/Symptoms: Modifying Factors:  Severity:  Timing: Context:   Objective  Well appearing patient in no apparent distress; mood and affect are within normal limits. Head - Anterior (Face), Scalp Stinging, itchy scalp without visible inflammation compatible with cutaneous dysesthesia.  Head - Anterior (Face) Inflammatory papules compatible with adult acne/folliculitis  Left Shoulder - Anterior Noninflamed 1 cm deep dermal white nodule    All sun exposed areas plus back examined.   Assessment & Plan    Dysesthesia (2) Head - Anterior (Face); Scalp  No intervention initiated  Acne vulgaris Head - Anterior (Face)  We will try topical topical dapsone daily for 1 to 2 months.  Dapsone 5 % topical gel - Head - Anterior (Face) Apply 1 application. topically at bedtime.  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 Place one drop on applicator and apply evenly along the skin of the upper eyelid at base of eyelashes once daily at bedtime; repeat procedure for second eye (use a clean applicator).  Madarosis of left eyelid Left Upper Eyelid  Related Medications bimatoprost (LATISSE) 0.03 % ophthalmic solution Place 1 applicator into both eyes at bedtime. Place one drop on applicator and apply evenly along the skin of the upper eyelid at base of eyelashes once daily at bedtime; repeat procedure for second eye (use a clean applicator).  Cyst of skin Left Shoulder - Anterior  Discussed elective excision, no procedure  scheduled      I, Janalyn Harder, MD, have reviewed all documentation for this visit.  The documentation on 07/27/21 for the exam, diagnosis, procedures, and orders are all accurate and complete.

## 2021-07-30 ENCOUNTER — Ambulatory Visit (INDEPENDENT_AMBULATORY_CARE_PROVIDER_SITE_OTHER): Payer: Medicare HMO

## 2021-07-30 DIAGNOSIS — T63441D Toxic effect of venom of bees, accidental (unintentional), subsequent encounter: Secondary | ICD-10-CM | POA: Diagnosis not present

## 2021-07-31 ENCOUNTER — Ambulatory Visit: Payer: Medicare HMO

## 2021-08-02 ENCOUNTER — Other Ambulatory Visit: Payer: Self-pay | Admitting: Internal Medicine

## 2021-08-02 DIAGNOSIS — E039 Hypothyroidism, unspecified: Secondary | ICD-10-CM

## 2021-08-02 DIAGNOSIS — I10 Essential (primary) hypertension: Secondary | ICD-10-CM

## 2021-08-13 ENCOUNTER — Ambulatory Visit: Payer: Medicare HMO

## 2021-08-14 ENCOUNTER — Ambulatory Visit (INDEPENDENT_AMBULATORY_CARE_PROVIDER_SITE_OTHER): Payer: Medicare HMO

## 2021-08-14 DIAGNOSIS — J309 Allergic rhinitis, unspecified: Secondary | ICD-10-CM

## 2021-08-16 ENCOUNTER — Ambulatory Visit (INDEPENDENT_AMBULATORY_CARE_PROVIDER_SITE_OTHER): Payer: Medicare HMO

## 2021-08-16 ENCOUNTER — Emergency Department (HOSPITAL_COMMUNITY)
Admission: EM | Admit: 2021-08-16 | Discharge: 2021-08-17 | Disposition: A | Discharge status: Home / Self Care | Payer: Medicare HMO | Attending: Emergency Medicine | Admitting: Emergency Medicine

## 2021-08-16 ENCOUNTER — Encounter (HOSPITAL_COMMUNITY): Payer: Self-pay | Admitting: Emergency Medicine

## 2021-08-16 DIAGNOSIS — S00262D Insect bite (nonvenomous) of left eyelid and periocular area, subsequent encounter: Secondary | ICD-10-CM | POA: Diagnosis not present

## 2021-08-16 DIAGNOSIS — W57XXXD Bitten or stung by nonvenomous insect and other nonvenomous arthropods, subsequent encounter: Secondary | ICD-10-CM | POA: Insufficient documentation

## 2021-08-16 DIAGNOSIS — Z79899 Other long term (current) drug therapy: Secondary | ICD-10-CM | POA: Diagnosis not present

## 2021-08-16 DIAGNOSIS — Y9389 Activity, other specified: Secondary | ICD-10-CM | POA: Insufficient documentation

## 2021-08-16 DIAGNOSIS — I10 Essential (primary) hypertension: Secondary | ICD-10-CM | POA: Diagnosis not present

## 2021-08-16 DIAGNOSIS — E119 Type 2 diabetes mellitus without complications: Secondary | ICD-10-CM | POA: Diagnosis not present

## 2021-08-16 DIAGNOSIS — Z7984 Long term (current) use of oral hypoglycemic drugs: Secondary | ICD-10-CM | POA: Diagnosis not present

## 2021-08-16 DIAGNOSIS — J4550 Severe persistent asthma, uncomplicated: Secondary | ICD-10-CM | POA: Diagnosis not present

## 2021-08-16 DIAGNOSIS — Y92810 Car as the place of occurrence of the external cause: Secondary | ICD-10-CM | POA: Diagnosis not present

## 2021-08-16 MED ORDER — EPINEPHRINE 0.3 MG/0.3ML IJ SOAJ: INTRAMUSCULAR | 1 refills | Status: DC

## 2021-08-16 NOTE — Addendum Note (Signed)
Addended by: Clovis Cao A on: 08/16/2021 07:23 PM   Modules accepted: Orders

## 2021-08-16 NOTE — ED Provider Triage Note (Signed)
Emergency Medicine Provider Triage Evaluation Note  Andrea Lowery , a 81 y.o. female  was evaluated in triage.  Pt complains of insect bite onset 4 PM.  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  Denies throat closing sensation, trouble breathing, rash.  No meds tried prior to arrival.  Review of Systems  Positive: As per HPI Negative:   Physical Exam  BP (!) 178/88 (BP Location: Right Arm)   Pulse 87   Temp 98.8 F (37.1 C) (Oral)   Resp 16   SpO2 96%  Gen:   Awake, no distress   Resp:  Normal effort  MSK:   Moves extremities without difficulty  Other:  Uvula midline without swelling.  Patent airway.  Nodule notable to left upper eyelid with mild erythema noted to the area.  No obvious punctate area noted.  Mild tenderness to palpation noted to region.  EOMI.  PERRL.  Medical Decision Making  Medically screening exam initiated at 4:49 PM.  Appropriate orders placed.  [name] was informed that the remainder of the evaluation will be completed by another provider, this initial triage assessment does not replace that evaluation, and the importance of remaining in the ED until their evaluation is complete.  Work-up initiated   Carilyn Woolston A, PA-C 08/16/21 1651

## 2021-08-16 NOTE — ED Triage Notes (Signed)
Patient here with complaint of being stung to the left of her left eye at approximately 1545. Patient states she is allergic to bees. No oral swelling, no shortness of breath, patient is speaking in complete sentences.

## 2021-08-17 MED ORDER — EPINEPHRINE 0.3 MG/0.3ML IJ SOAJ: 0.3000 mg | INTRAMUSCULAR | 0 refills | Status: DC | PRN

## 2021-08-17 MED ORDER — HYDROCORTISONE 1 % EX CREA: TOPICAL_CREAM | CUTANEOUS | 0 refills | Status: DC

## 2021-08-17 NOTE — Discharge Instructions (Addendum)
You were seen in the emergency department today for an insect bite. I am prescribing you an Epi-Pen for safety.  Please pick this up on the way home.  Additionally I prescribed you some hydrocortisone cream for the bite.  You may use ice over the area as well for any swelling.  Please take some Benadryl when you get home which will help with your itching.  You can take this as prescribed on the bottle.  Please return to emergency department if you have any difficulty breathing, swelling of your tongue or lips or begin to break out in a rash.

## 2021-08-17 NOTE — ED Provider Notes (Signed)
[facility] EMERGENCY DEPARTMENT Provider Note   CSN: [id number] Arrival date & time: 08/16/21  1626     History  Chief Complaint  Patient presents with   Insect Bite    Andrea Lowery is a 81 y.o. female.  With past medical history of diabetes, hypertension who presents emergency department with insect bite.    States that around 4 PM today she was driving in the car when a bug flew into the window and bit her on the side of her left eye.  She states that since then she has had worsening itching.  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  She denies any difficulty breathing, wheezing, swelling of her lips or tongue, shortness of breath, vomiting.  HPI     Home Medications Prior to Admission medications   Medication Sig Start Date End Date Taking? Authorizing Provider  albuterol (VENTOLIN HFA) 108 (90 Base) MCG/ACT inhaler Inhale 2 puffs into the lungs every 4 (four) hours as needed for wheezing or shortness of breath. 05/17/21   Timothy Lasso, MD  Alcohol Swabs (DROPSAFE ALCOHOL PREP) 70 % PADS USE  TOPICALLY AS NEEDED 07/19/21   Timothy Lasso, MD  alendronate (FOSAMAX) 70 MG tablet Take 70 mg by mouth every Wednesday.  05/30/15   [provider]  amLODipine-olmesartan (AZOR) 10-40 MG tablet Take 1 tablet by mouth daily. 05/17/21 05/12/22  Timothy Lasso, MD  azithromycin (ZITHROMAX) 500 MG tablet Take 1 tablet (500 mg total) by mouth daily. 02/13/21   Kozlow, Donnamarie Poag, MD  Benralizumab (FASENRA PEN) 30 MG/ML SOAJ Inject 1 mL (30 mg total) into the skin every 8 (eight) weeks. 05/22/21   Timothy Lasso, MD  benzonatate (TESSALON PERLES) 100 MG capsule Take 1 capsule (100 mg total) by mouth 3 (three) times daily as needed for cough. 06/14/21 06/14/22  Demaio, Alexa, MD  bimatoprost (LATISSE) 0.03 % ophthalmic solution Place 1 applicator into both eyes at bedtime. Place one drop on  applicator and apply evenly along the skin of the upper eyelid at base of eyelashes once daily at bedtime; repeat procedure for second eye (use a clean applicator). 07/04/21   Lavonna Monarch, MD  Blood Glucose Monitoring Suppl (TRUE METRIX METER) w/Device KIT Use as instructed 05/22/21   Timothy Lasso, MD  budesonide-formoterol (SYMBICORT) 160-4.5 MCG/ACT inhaler INHALE 2 PUFFS BY MOUTH INTO THE LUNGS TWICE DAILY RINSE GARGLE AND SPIT AFTER USE 08/31/20   Kozlow, Donnamarie Poag, MD  carbamazepine (TEGRETOL XR) 200 MG 12 hr tablet Take 1 tablet (200 mg total) by mouth daily. 09/13/20   Sanjuan Dame, MD  Dapsone 5 % topical gel Apply 1 application. topically at bedtime. 07/04/21   Lavonna Monarch, MD  doxycycline (VIBRAMYCIN) 100 MG capsule Take 1 capsule (100 mg total) by mouth 2 (two) times daily. 06/11/21   Fransico Meadow, PA-C  DULoxetine (CYMBALTA) 30 MG capsule Take 2 capsules (60 mg total) by mouth daily. 07/24/21   Lacinda Axon, MD  empagliflozin (JARDIANCE) 10 MG TABS tablet Take 1 tablet (10 mg total) by mouth daily. 05/18/21   Timothy Lasso, MD  EPINEPHrine 0.3 mg/0.3 mL IJ SOAJ injection Use as directed for severe allergic reaction 08/16/21   Valentina Shaggy, MD  esomeprazole (NEXIUM) 40 MG capsule Take 1 capsule (40 mg total) by mouth 2 (two) times daily. 05/17/21   Timothy Lasso, MD  fluticasone (FLONASE) 50 MCG/ACT nasal spray Place 2 sprays  into both nostrils daily. 02/06/21   Kozlow, Donnamarie Poag, MD  glucose blood (TRUE METRIX BLOOD GLUCOSE TEST) test strip Use as instructed 05/22/21   Timothy Lasso, MD  hydrochlorothiazide (MICROZIDE) 12.5 MG capsule TAKE 1 CAPSULE EVERY DAY 08/02/21   Axel Filler, MD  hydrOXYzine (ATARAX) 10 MG tablet TAKE 1 TABLET THREE TIMES DAILY AS NEEDED FOR  ITCHING 06/12/21   Timothy Lasso, MD  ipratropium-albuterol (DUONEB) 0.5-2.5 (3) MG/3ML SOLN Take 3 mLs by nebulization every 6 (six) hours as needed. 05/17/21   Timothy Lasso, MD   ketoconazole (NIZORAL) 2 % cream  11/29/19   [provider]  levothyroxine (SYNTHROID) 50 MCG tablet TAKE 1 TABLET EVERY DAY (NEED MD APPOINTMENT FOR REFILLS) 08/02/21   Axel Filler, MD  metoprolol succinate (TOPROL XL) 50 MG 24 hr tablet Take 1 tablet (50 mg total) by mouth daily. Take with or immediately following a meal. 07/24/21 07/24/22  Lacinda Axon, MD  montelukast (SINGULAIR) 10 MG tablet Take 1 tablet (10 mg total) by mouth at bedtime. 02/06/21   Kozlow, Donnamarie Poag, MD  rosuvastatin (CRESTOR) 40 MG tablet TAKE 1 TABLET EVERY DAY (NEED MD APPOINTMENT FOR REFILLS) 08/02/21   Axel Filler, MD  spironolactone (ALDACTONE) 25 MG tablet Take 1 tablet (25 mg total) by mouth daily. 07/24/21 07/24/22  Lacinda Axon, MD  topiramate (TOPAMAX) 50 MG tablet Take 1 tablet (50 mg total) by mouth daily. 05/17/21 08/15/21  Timothy Lasso, MD  triamcinolone cream (KENALOG) 0.1 % Apply 1 application topically daily. 08/30/20   Lavonna Monarch, MD  TRUEplus Lancets 33G MISC USE AS DIRECTED 06/19/21   Timothy Lasso, MD  Vitamin D, Ergocalciferol, (DRISDOL) 1.25 MG (50000 UNIT) CAPS capsule Take 50,000 Units by mouth every 7 (seven) days. Pt takes on Wednesday of each week    [provider]      Allergies    Bee venom and Penicillins    Review of Systems   Review of Systems  Skin:  Positive for wound.  All other systems reviewed and are negative.   Physical Exam Updated Vital Signs BP (!) 168/63 (BP Location: Right Arm)   Pulse 67   Temp (!) 97.5 F (36.4 C) (Oral)   Resp 12   SpO2 98%  Physical Exam Vitals and nursing note reviewed.  Constitutional:      General: She is not in acute distress.    Appearance: Normal appearance. She is not ill-appearing.  HENT:     Head: Normocephalic and atraumatic.     Nose: Nose normal.     Mouth/Throat:     Mouth: Mucous membranes are moist.     Pharynx: Oropharynx is clear.  Eyes:     General: No scleral  icterus.    Extraocular Movements: Extraocular movements intact.     Pupils: Pupils are equal, round, and reactive to light.  Cardiovascular:     Rate and Rhythm: Normal rate and regular rhythm.     Pulses: Normal pulses.  Pulmonary:     Effort: Pulmonary effort is normal. No respiratory distress.     Breath sounds: Normal breath sounds. No stridor. No wheezing.  Abdominal:     Palpations: Abdomen is soft.  Musculoskeletal:     Cervical back: Neck supple.  Skin:    General: Skin is warm and dry.     Capillary Refill: Capillary refill takes less than 2 seconds.     Findings: No erythema or rash.     Comments:  Small area of swelling just lateral to the left eye No urticaria No angioedema No stridor   Neurological:     General: No focal deficit present.     Mental Status: She is alert and oriented to person, place, and time. Mental status is at baseline.  Psychiatric:        Mood and Affect: Mood normal.        Behavior: Behavior normal.        Thought Content: Thought content normal.        Judgment: Judgment normal.     ED Results / Procedures / Treatments   Labs (all labs ordered are listed, but only abnormal results are displayed) Labs Reviewed - No data to display  EKG None  Radiology No results found.  Procedures Procedures   Medications Ordered in ED Medications - No data to display  ED Course/ Medical Decision Making/ A&P                           Medical Decision Making Risk Prescription drug management.  This patient presents to the ED for concern of insect bite, this involves an extensive number of treatment options, and is a complaint that carries with it a high risk of complications and morbidity.  The differential diagnosis includes allergic reaction, animal or human bite, etc.    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 -I did  prescribe new home medications.  Tests Considered: None indicated  Critical Interventions: None required   Consultations: None required   SDH None identified   ED Course:  81 year old female who presents to the emergency department after an insect bite today at 4 PM. Physical exam notable for a small, what appears to be a bite just lateral to the left eye.  She does have diffuse itching over her body but there is no rash present.    Presentation is not consistent with acute anaphylaxis.  There is no pulmonary, dermatologic, cardiovascular or GI symptoms.  There is no hypotension.  Presentation not consistent with angioedema.  There is no evidence of airway compromise or shock.  I did offer her Benadryl which she declined and said that she would rather just take some at home.  No indication for epinephrine at this time.  She states that she did receive an epi pin prescription from her allergist but has yet to fill the prescription.  I have prescribed one to her to the 24-hour pharmacy for her to pick up on the way home.  She verbalized understanding that she needs to pick this up.  I also prescribed hydrocortisone cream for the bite.  She is given strict return precautions should her symptomology worsen including evidence of angioedema or anaphylaxis.  She verbalized understanding.  After consideration of the diagnostic results and the patients response to treatment, I feel that the patent would benefit from discharge. The patient has been appropriately medically screened and/or stabilized in the ED. I have low suspicion for any other emergent medical condition which would require further screening, evaluation or treatment in the ED or require inpatient management. The patient is  overall well appearing and non-toxic in appearance. They are  hemodynamically stable at time of discharge.   Final Clinical Impression(s) / ED Diagnoses Final diagnoses:  Insect bite of left eyelid, subsequent encounter    Rx / DC Orders ED Discharge Orders          Ordered    EPINEPHrine 0.3 mg/0.3 mL IJ SOAJ injection  As needed        08/17/21 0146    hydrocortisone cream 1 %        08/17/21 0146              Mickie Hillier, PA-C 08/17/21 7564    Ripley Fraise, MD 08/17/21 [phone]

## 2021-08-28 ENCOUNTER — Ambulatory Visit (INDEPENDENT_AMBULATORY_CARE_PROVIDER_SITE_OTHER): Payer: Medicare HMO

## 2021-08-28 DIAGNOSIS — J309 Allergic rhinitis, unspecified: Secondary | ICD-10-CM

## 2021-09-08 ENCOUNTER — Other Ambulatory Visit: Payer: Self-pay | Admitting: Internal Medicine

## 2021-09-11 ENCOUNTER — Other Ambulatory Visit: Payer: Self-pay | Admitting: Internal Medicine

## 2021-09-13 ENCOUNTER — Ambulatory Visit
Admission: EM | Admit: 2021-09-13 | Discharge: 2021-09-13 | Disposition: A | Discharge status: Home / Self Care | Payer: Medicare HMO | Attending: Physician Assistant | Admitting: Physician Assistant

## 2021-09-13 DIAGNOSIS — J069 Acute upper respiratory infection, unspecified: Secondary | ICD-10-CM | POA: Diagnosis not present

## 2021-09-13 NOTE — ED Triage Notes (Signed)
Pt reports to [facility] with co of otalgia and sore throat. Pt reports friend of her is in hospital with covid and she just had a new grandchild born this morning so she wants to be tested to be sure.

## 2021-09-13 NOTE — ED Provider Notes (Signed)
[name]    CSN: [id number] Arrival date & time: 09/13/21  1637      History   Chief Complaint No chief complaint on file.   HPI Andrea Lowery is a 81 y.o. female.   Patient here today for evaluation of cough, sore throat, and congestion that started over the last few days.  She reports she is also having some ear pain.  She notes that she was very close to a friend who is now in the hospital with Andrea Lowery.  She does not report fever.  The history is provided by the patient.    Past Medical History:  Diagnosis Date   Arthritis    Asthma    Diabetes mellitus without complication ([place])    Diabetes II   DVT (deep venous thrombosis) ([place])    "Years ago"   Dyslipidemia    Eczema    GERD (gastroesophageal reflux disease)    Headache    Migraine   Hypertension    Hypothyroidism    LBBB (left bundle branch block)    MGUS (monoclonal gammopathy of unknown significance)    MGUS (monoclonal gammopathy of unknown significance)    Migraine    OSA on CPAP     Patient Active Problem List   Diagnosis Date Noted   Urinary frequency 07/25/2021   Upper respiratory infection 12/04/2020   Protrusion of lumbar intervertebral disc 09/13/2019   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   Eczema 08/02/2016   Hypokalemia 04/30/2016   Acute severe exacerbation of asthma 01/10/2016   Seborrheic dermatitis of scalp 11/20/2015   Osteoporosis without current pathological fracture 05/30/2015   Chronic right-sided low back pain with right-sided sciatica 03/31/2015   Temporal arteritis ([place]) 11/07/2014   Type 2 diabetes mellitus ([place]) 78/29/5621   Diastolic CHF ([place]) 30/86/5784   Sacroiliac dysfunction 11/04/2014   GERD (gastroesophageal reflux disease) 10/05/2014   Hypothyroidism 09/21/2014   Anxiety and depression 03/18/2014   Difficulty sleeping 12/28/2013   Hyperlipidemia 12/27/2013   Bilateral leg edema 07/20/2013   MGUS (monoclonal gammopathy of unknown significance)  05/20/2013   Monoclonal paraproteinemia 05/20/2013   Essential hypertension 07/01/2012   DOE (dyspnea on exertion) 07/01/2012   OSA on CPAP 07/01/2012    Past Surgical History:  Procedure Laterality Date   ABDOMINAL HYSTERECTOMY  1969   ARTERY BIOPSY N/A 11/07/2014   Procedure: BIOPSY TEMPORAL ARTERY;  Surgeon: Leta Baptist, MD;  Location: [facility];  Service: ENT;  Laterality: N/A;   CARPAL TUNNEL RELEASE Right    CHOLECYSTECTOMY  1976   ENDOVENOUS ABLATION SAPHENOUS VEIN W/ LASER Left 08/24/2020   endovenous laser ablation left greater saphenous vein by Gae Gallop MD   LUMBAR LAMINECTOMY/DECOMPRESSION MICRODISCECTOMY N/A 07/31/2015   Procedure: L4-5 Decompression, Possible Right L4-5 Microdiscectomy;  Surgeon: Marybelle Killings, MD;  Location: [facility];  Service: Orthopedics;  Laterality: N/A;   NM MYOVIEW LTD  02/13/2010   No ischemia   PLANTAR FASCIA RELEASE     TONSILLECTOMY  1965   US ECHOCARDIOGRAPHY  09/20/2008   borderline LVH,mild TR,AOV mildly sclerotic w/ca+ of the leaflets    OB History   No obstetric history on file.      Home Medications    Prior to Admission medications   Medication Sig Start Date End Date Taking? Authorizing Provider  TRUEplus Lancets 33G MISC USE AS DIRECTED 09/10/21   Axel Filler, MD  albuterol (VENTOLIN HFA) 108 (90 Base) MCG/ACT inhaler Inhale 2 puffs into the lungs every 4 (four) hours  as needed for wheezing or shortness of breath. 05/17/21   Timothy Lasso, MD  Alcohol Swabs (DROPSAFE ALCOHOL PREP) 70 % PADS USE TOPICALLY AS NEEDED AS DIRECTED 09/12/21   Axel Filler, MD  alendronate (FOSAMAX) 70 MG tablet Take 70 mg by mouth every Wednesday.  05/30/15   [provider]  amLODipine-olmesartan (AZOR) 10-40 MG tablet Take 1 tablet by mouth daily. 05/17/21 05/12/22  Timothy Lasso, MD  azithromycin (ZITHROMAX) 500 MG tablet Take 1 tablet (500 mg total) by mouth daily. 02/13/21   Kozlow, Donnamarie Poag, MD  Benralizumab (FASENRA PEN) 30  MG/ML SOAJ Inject 1 mL (30 mg total) into the skin every 8 (eight) weeks. 05/22/21   Timothy Lasso, MD  benzonatate (TESSALON PERLES) 100 MG capsule Take 1 capsule (100 mg total) by mouth 3 (three) times daily as needed for cough. 06/14/21 06/14/22  Demaio, Alexa, MD  bimatoprost (LATISSE) 0.03 % ophthalmic solution Place 1 applicator into both eyes at bedtime. Place one drop on applicator and apply evenly along the skin of the upper eyelid at base of eyelashes once daily at bedtime; repeat procedure for second eye (use a clean applicator). 07/04/21   Lavonna Monarch, MD  Blood Glucose Monitoring Suppl (TRUE METRIX METER) w/Device KIT Use as instructed 05/22/21   Timothy Lasso, MD  budesonide-formoterol (SYMBICORT) 160-4.5 MCG/ACT inhaler INHALE 2 PUFFS BY MOUTH INTO THE LUNGS TWICE DAILY RINSE GARGLE AND SPIT AFTER USE 08/31/20   Kozlow, Donnamarie Poag, MD  carbamazepine (TEGRETOL XR) 200 MG 12 hr tablet Take 1 tablet (200 mg total) by mouth daily. 09/13/20   Sanjuan Dame, MD  Dapsone 5 % topical gel Apply 1 application. topically at bedtime. 07/04/21   Lavonna Monarch, MD  doxycycline (VIBRAMYCIN) 100 MG capsule Take 1 capsule (100 mg total) by mouth 2 (two) times daily. 06/11/21   Fransico Meadow, PA-C  DULoxetine (CYMBALTA) 30 MG capsule Take 2 capsules (60 mg total) by mouth daily. 07/24/21   Lacinda Axon, MD  empagliflozin (JARDIANCE) 10 MG TABS tablet Take 1 tablet (10 mg total) by mouth daily. 05/18/21   Timothy Lasso, MD  EPINEPHrine 0.3 mg/0.3 mL IJ SOAJ injection Use as directed for severe allergic reaction 08/16/21   Valentina Shaggy, MD  EPINEPHrine 0.3 mg/0.3 mL IJ SOAJ injection Inject 0.3 mg into the muscle as needed for anaphylaxis. 08/17/21   Mickie Hillier, PA-C  esomeprazole (NEXIUM) 40 MG capsule Take 1 capsule (40 mg total) by mouth 2 (two) times daily. 05/17/21   Timothy Lasso, MD  fluticasone (FLONASE) 50 MCG/ACT nasal spray Place 2 sprays into both nostrils daily. 02/06/21    Kozlow, Donnamarie Poag, MD  glucose blood (TRUE METRIX BLOOD GLUCOSE TEST) test strip Use as instructed 05/22/21   Timothy Lasso, MD  hydrochlorothiazide (MICROZIDE) 12.5 MG capsule TAKE 1 CAPSULE EVERY DAY 08/02/21   Axel Filler, MD  hydrocortisone cream 1 % Apply to affected area 2 times daily 08/17/21   Mickie Hillier, PA-C  hydrOXYzine (ATARAX) 10 MG tablet TAKE 1 TABLET THREE TIMES DAILY AS NEEDED FOR  ITCHING 06/12/21   Timothy Lasso, MD  ipratropium-albuterol (DUONEB) 0.5-2.5 (3) MG/3ML SOLN Take 3 mLs by nebulization every 6 (six) hours as needed. 05/17/21   Timothy Lasso, MD  ketoconazole (NIZORAL) 2 % cream  11/29/19   [provider]  levothyroxine (SYNTHROID) 50 MCG tablet TAKE 1 TABLET EVERY DAY (NEED MD APPOINTMENT FOR REFILLS) 08/02/21   Axel Filler, MD  metoprolol succinate (TOPROL  XL) 50 MG 24 hr tablet Take 1 tablet (50 mg total) by mouth daily. Take with or immediately following a meal. 07/24/21 07/24/22  Lacinda Axon, MD  montelukast (SINGULAIR) 10 MG tablet Take 1 tablet (10 mg total) by mouth at bedtime. 02/06/21   Kozlow, Donnamarie Poag, MD  rosuvastatin (CRESTOR) 40 MG tablet TAKE 1 TABLET EVERY DAY (NEED MD APPOINTMENT FOR REFILLS) 08/02/21   Axel Filler, MD  spironolactone (ALDACTONE) 25 MG tablet Take 1 tablet (25 mg total) by mouth daily. 07/24/21 07/24/22  Lacinda Axon, MD  topiramate (TOPAMAX) 50 MG tablet Take 1 tablet (50 mg total) by mouth daily. 05/17/21 08/15/21  Timothy Lasso, MD  triamcinolone cream (KENALOG) 0.1 % Apply 1 application topically daily. 08/30/20   Lavonna Monarch, MD  Vitamin D, Ergocalciferol, (DRISDOL) 1.25 MG (50000 UNIT) CAPS capsule Take 50,000 Units by mouth every 7 (seven) days. Pt takes on Wednesday of each week    [provider]    Family History Family History  Problem Relation Age of Onset   Diabetes Father    Heart failure Father    Hypertension Father    Sleep apnea Father    Thyroid  disease Sister    Hypertension Sister     Social History Social History   Tobacco Use   Smoking status: Former    Types: Cigarettes    Quit date: 02/04/2006    Years since quitting: 15.6   Smokeless tobacco: Never  Vaping Use   Vaping Use: Never used  Substance Use Topics   Alcohol use: No    Alcohol/week: 0.0 standard drinks of alcohol   Drug use: No     Allergies   Bee venom and Penicillins   Review of Systems Review of Systems  Constitutional:  Negative for chills and fever.  HENT:  Positive for congestion, ear pain and sore throat.   Eyes:  Negative for discharge and redness.  Respiratory:  Positive for cough. Negative for shortness of breath.   Gastrointestinal:  Negative for abdominal pain, nausea and vomiting.     Physical Exam Triage Vital Signs ED Triage Vitals  Enc Vitals Group     BP 09/13/21 1653 (!) 150/83     Pulse Rate 09/13/21 1653 93     Resp 09/13/21 1653 18     Temp 09/13/21 1653 98.2 F (36.8 C)     Temp src --      SpO2 09/13/21 1653 97 %     Weight --      Height --      Head Circumference --      Peak Flow --      Pain Score 09/13/21 1655 3     Pain Loc --      Pain Edu? --      Excl. in [place]? --    No data found.  Updated Vital Signs BP (!) 150/83   Pulse 93   Temp 98.2 F (36.8 C)   Resp 18   SpO2 97%      Physical Exam Vitals and nursing note reviewed.  Constitutional:      General: She is not in acute distress.    Appearance: Normal appearance. She is not ill-appearing.  HENT:     Head: Normocephalic and atraumatic.     Right Ear: Tympanic membrane normal.     Left Ear: Tympanic membrane normal.     Nose: Congestion present.  Eyes:     Conjunctiva/sclera: Conjunctivae normal.  Cardiovascular:     Rate and Rhythm: Normal rate and regular rhythm.  Pulmonary:     Effort: Pulmonary effort is normal. No respiratory distress.     Breath sounds: Normal breath sounds. No wheezing, rhonchi or rales.  Neurological:      Mental Status: She is alert.  Psychiatric:        Mood and Affect: Mood normal.        Behavior: Behavior normal.        Thought Content: Thought content normal.      UC Treatments / Results  Labs (all labs ordered are listed, but only abnormal results are displayed) Labs Reviewed  NOVEL CORONAVIRUS, NAA    EKG   Radiology No results found.  Procedures Procedures (including critical care time)  Medications Ordered in UC Medications - No data to display  Initial Impression / Assessment and Plan / UC Course  I have reviewed the triage vital signs and the nursing notes.  Pertinent labs & imaging results that were available during my care of the patient were reviewed by me and considered in my medical decision making (see chart for details).    Suspect viral etiology of symptoms.  COVID screening ordered.  Will await results for further recommendation.  Final Clinical Impressions(s) / UC Diagnoses   Final diagnoses:  Upper respiratory tract infection, unspecified type   Discharge Instructions   None    ED Prescriptions   None    PDMP not reviewed this encounter.   Francene Finders, PA-C 09/13/21 1814

## 2021-09-14 LAB — NOVEL CORONAVIRUS, NAA: SARS-CoV-2, NAA: NOT DETECTED

## 2021-09-19 ENCOUNTER — Ambulatory Visit (INDEPENDENT_AMBULATORY_CARE_PROVIDER_SITE_OTHER): Payer: Medicare HMO

## 2021-09-19 ENCOUNTER — Ambulatory Visit (INDEPENDENT_AMBULATORY_CARE_PROVIDER_SITE_OTHER): Payer: Medicare HMO | Admitting: Student

## 2021-09-19 DIAGNOSIS — M654 Radial styloid tenosynovitis [de Quervain]: Secondary | ICD-10-CM

## 2021-09-19 DIAGNOSIS — J019 Acute sinusitis, unspecified: Secondary | ICD-10-CM | POA: Diagnosis not present

## 2021-09-19 DIAGNOSIS — G4733 Obstructive sleep apnea (adult) (pediatric): Secondary | ICD-10-CM

## 2021-09-19 DIAGNOSIS — J309 Allergic rhinitis, unspecified: Secondary | ICD-10-CM

## 2021-09-19 DIAGNOSIS — Z9989 Dependence on other enabling machines and devices: Secondary | ICD-10-CM

## 2021-09-19 MED ORDER — DICLOFENAC SODIUM 1 % EX GEL: CUTANEOUS | 2 refills | Status: DC

## 2021-09-19 MED ORDER — THUMB SPLINT/LEFT MEDIUM MISC
1 refills | Status: DC
Start: 2021-09-19 — End: 2021-11-05

## 2021-09-19 MED ORDER — THUMB SPLINT/LEFT MEDIUM MISC
1 refills | Status: DC
Start: 2021-09-19 — End: 2021-09-19

## 2021-09-19 MED ORDER — DICLOFENAC SODIUM 1 % EX GEL
CUTANEOUS | 2 refills | Status: DC
Start: 2021-09-19 — End: 2021-09-19

## 2021-09-19 NOTE — Patient Instructions (Signed)
Thank you, Ms.[name] for allowing [place] to provide your care today. Today we discussed sinus pain and thumb pain.    For your sinus pain, try over the counter Tylenol and sinus rinses as well as your flonase  For your thumb pain, I want you to try voltaren gel and thumb splint. Follow up if your symptoms worsens.   I have ordered the following medication/changed the following medications:  Voltaren gel, apply to left wrist/thumb up to 4 times daily OTC Tylenol 1000 mg three times daily OTC NeilMed Sinus Rinse  My Chart Access: [email or web address]  Please follow-up with PCP or as needed  Please make sure to arrive 15 minutes prior to your next appointment. If you arrive late, you may be asked to reschedule.    We look forward to seeing you next time. Please call our clinic at [phone] if you have any questions or concerns. The best time to call is Monday-Friday from 9am-4pm, but there is someone available 24/7. If after hours or the weekend, call the main hospital number and ask for the Internal Medicine Resident On-Call. If you need medication refills, please notify your pharmacy one week in advance and they will send [place] a request.   Thank you for letting us take part in your care. Wishing you the best!  Lacinda Axon, MD 09/19/2021, 4:13 PM IM Resident, PGY-3 [facility]ian 41:10

## 2021-09-19 NOTE — Assessment & Plan Note (Deleted)
[id number]

## 2021-09-19 NOTE — Progress Notes (Signed)
CC: Left facial pain/left thumb pain  HPI:  Andrea Lowery is a 81 y.o. female with PMH as below who presents to clinic to follow-up on left facial and left thumb pain. Please see problem based charting for evaluation, assessment and plan.  Past Medical History:  Diagnosis Date   Arthritis    Asthma    Diabetes mellitus without complication ([place])    Diabetes II   DVT (deep venous thrombosis) ([place])    "Years ago"   Dyslipidemia    Eczema    GERD (gastroesophageal reflux disease)    Headache    Migraine   Hypertension    Hypothyroidism    LBBB (left bundle branch block)    MGUS (monoclonal gammopathy of unknown significance)    MGUS (monoclonal gammopathy of unknown significance)    Migraine    OSA on CPAP     Review of Systems:  Constitutional: Negative for fever, chills or fatigue HEENT: Positive for otalgia, sinus pain, postnasal drip and occasional dry cough Respiratory: Negative for shortness of breath or wheezing MSK: Positive for left wrist and thumb pain. Negative for trauma Neuro: Negative for headache, numbness or weakness  Physical Exam: General: Pleasant, well-appearing elderly woman. No acute distress. Head: Mild tenderness to palpation of the sinuses Ears: Normal tympanic membranes, no canal erythema or exudate. Nose: Mild erythema of the nasal mucosa, left > right.  Mouth: MMM. False teeth. No exudates erythema or abscess. Cardiac: RRR. No murmurs, rubs or gallops. No LE edema Respiratory: Lungs CTAB. No wheezing or crackles. Abdominal: Soft, symmetric and non tender. Normal BS. Skin: Warm, dry and intact without rashes or lesions MSK: Mild tenderness to palpation of the dorsal aspect of the left thumb and CMC joint, no edema or erythema. Positive Finkelstein test. Negative Phalen's test. Decreased ROM of left thumb. Neuro: A&O x 3. Moves all extremities.  Normal sensation to gross touch Psych: Appropriate mood and affect.  Vitals:   09/19/21  1527  BP: (!) 154/63  Pulse: 76  Temp: 98.2 F (36.8 C)  TempSrc: Oral  SpO2: 98%  Weight: 191 lb 12.8 oz (87 kg)  Height: '5\' 5"'$  (1.651 m)    Assessment & Plan:   De Quervain's tenosynovitis, left Patient with a history of mild degenerative changes of the left first [facility] joint here for evaluation of left wrist and thumb pain.  Patient states over the last week, she has had pain and stiffness to her thumb. Pain is described as aching.  She does work on her garden occasionally but denies any trauma to her wrist. On exam, there is some tenderness on the dorsal aspect of the left thumb but no redness or swelling around the [facility] joint. The Phalen's test was negative and Wynn Maudlin test was positive. Patient's left thumb/wrist pain unlikely to be carpal tunnel. Most likely de Quervain's tendinosynovitis secondary to repetitive use of her wrist. Worsening degenerative changes of her MCP joint also likely.  Plan: -Start Voltaren gel, apply to wrist as needed for wrist pain -Left thumb splint -Consider imaging if symptoms worsen or do not improve after a few weeks  Sinusitis Patient here for evaluation of sinus pain, ear pain and left facial pain. Patient reports that last week, her friend was found to have [place] and required hospitalization. She had a mild cough so she went to the urgent care to be evaluated and was COVID-negative. Since then, she has had some pain in her sinuses as well as the left side  of her face with associated ear pain. She also endorses postnasal drip and occasional dry cough but denies any fevers, tinnitus, chills, shortness of breath, runny nose or sore throat. HEENT exam positive for mild tenderness to her sinuses as well as erythema to her nasal mucosa.  Patient has no shortness of breath has only a mild dry cough so acute bronchitis unlikely. She has false teeth and has been off her bisphosphonate so making osteonecrosis unlikely. Patient's symptoms more consistent with acute  sinusitis likely secondary to recent viral exposure.  Plan: -Refill Flonase -Start OTC NeilMed sinus rinse -Tylenol as needed for pain -Advised to follow-up if symptoms worsen or does not improve after 1 week    See Encounters Tab for problem based charting.  Patient discussed with Dr.  Thomes Cake, MD, MPH

## 2021-09-21 ENCOUNTER — Encounter: Payer: Self-pay | Admitting: Student

## 2021-09-21 DIAGNOSIS — M654 Radial styloid tenosynovitis [de Quervain]: Secondary | ICD-10-CM | POA: Insufficient documentation

## 2021-09-21 DIAGNOSIS — J329 Chronic sinusitis, unspecified: Secondary | ICD-10-CM | POA: Insufficient documentation

## 2021-09-21 NOTE — Assessment & Plan Note (Addendum)
Patient here for evaluation of sinus pain, ear pain and left facial pain. Patient reports that last week, her friend was found to have [place] and required hospitalization. She had a mild cough so she went to the urgent care to be evaluated and was COVID-negative. Since then, she has had some pain in her sinuses as well as the left side of her face with associated ear pain. She also endorses postnasal drip and occasional dry cough but denies any fevers, tinnitus, chills, shortness of breath, runny nose or sore throat. HEENT exam positive for mild tenderness to her sinuses as well as erythema to her nasal mucosa.  Patient has no shortness of breath has only a mild dry cough so acute bronchitis unlikely. She has false teeth and has been off her bisphosphonate so making osteonecrosis unlikely. Patient's symptoms more consistent with acute sinusitis likely secondary to recent viral exposure.  Plan: -Refill Flonase -Start OTC NeilMed sinus rinse -Tylenol 1000 mg 3 times daily as needed for pain -Advised to follow-up if symptoms worsen or does not improve after 1 week

## 2021-09-21 NOTE — Assessment & Plan Note (Signed)
Patient with a history of mild degenerative changes of the left first [facility] joint here for evaluation of left wrist and thumb pain.  Patient states over the last week, she has had pain and stiffness to her thumb. Pain is described as aching.  She does work on her garden occasionally but denies any trauma to her wrist. On exam, there is some tenderness on the dorsal aspect of the left thumb but no redness or swelling around the [facility] joint. The Phalen's test was negative and Wynn Maudlin test was positive. Patient's left thumb/wrist pain unlikely to be carpal tunnel. Most likely de Quervain's tendinosynovitis secondary to repetitive use of her wrist. Worsening degenerative changes of her MCP joint also likely.  Plan: -Start Voltaren gel, apply to wrist as needed for wrist pain -Left thumb splint -Consider imaging if symptoms worsen or do not improve after a few weeks

## 2021-09-21 NOTE — Progress Notes (Signed)
Internal Medicine Clinic Attending  Case discussed with Dr. Coy Saunas  At the time of the visit.  We reviewed the resident's history and exam and pertinent patient test results.  I agree with the assessment, diagnosis, and plan of care documented in the resident's note. Thumb/wrist symptoms sound consistent with tenosynovitis vs CMC arthritis. Agree with topical NSAID and splint with re-evaluation if not improving.

## 2021-09-24 ENCOUNTER — Ambulatory Visit: Payer: Medicare HMO

## 2021-09-25 ENCOUNTER — Ambulatory Visit (INDEPENDENT_AMBULATORY_CARE_PROVIDER_SITE_OTHER): Payer: Medicare HMO | Admitting: *Deleted

## 2021-09-25 DIAGNOSIS — T63441D Toxic effect of venom of bees, accidental (unintentional), subsequent encounter: Secondary | ICD-10-CM

## 2021-10-01 ENCOUNTER — Telehealth: Payer: Self-pay

## 2021-10-01 NOTE — Telephone Encounter (Signed)
Patient is due for a fasenra shot on 10/11/21. I called the patient to inform her that our old [place] office will be open if she would like come in next week. Patient did not have a voicebox set up to leave a message.

## 2021-10-10 ENCOUNTER — Encounter: Payer: Self-pay | Admitting: Internal Medicine

## 2021-10-10 ENCOUNTER — Ambulatory Visit (INDEPENDENT_AMBULATORY_CARE_PROVIDER_SITE_OTHER): Payer: Medicare HMO | Admitting: Internal Medicine

## 2021-10-10 VITALS — BP 158/75 | HR 68 | Temp 98.3°F | Ht 66.0 in | Wt 189.0 lb

## 2021-10-10 DIAGNOSIS — M810 Age-related osteoporosis without current pathological fracture: Secondary | ICD-10-CM | POA: Diagnosis not present

## 2021-10-10 DIAGNOSIS — I10 Essential (primary) hypertension: Secondary | ICD-10-CM | POA: Diagnosis not present

## 2021-10-10 DIAGNOSIS — M654 Radial styloid tenosynovitis [de Quervain]: Secondary | ICD-10-CM | POA: Diagnosis not present

## 2021-10-10 DIAGNOSIS — Z87891 Personal history of nicotine dependence: Secondary | ICD-10-CM

## 2021-10-10 DIAGNOSIS — E1169 Type 2 diabetes mellitus with other specified complication: Secondary | ICD-10-CM | POA: Diagnosis not present

## 2021-10-10 DIAGNOSIS — E559 Vitamin D deficiency, unspecified: Secondary | ICD-10-CM

## 2021-10-10 DIAGNOSIS — Z7984 Long term (current) use of oral hypoglycemic drugs: Secondary | ICD-10-CM

## 2021-10-10 LAB — GLUCOSE, CAPILLARY: Glucose-Capillary: 129 mg/dL — ABNORMAL HIGH (ref 70–99)

## 2021-10-10 LAB — POCT GLYCOSYLATED HEMOGLOBIN (HGB A1C): Hemoglobin A1C: 7.4 % — AB (ref 4.0–5.6)

## 2021-10-10 NOTE — Patient Instructions (Addendum)
Thank you, Andrea Lowery for allowing [place] to provide your care today.   Left hand I think you have some irritation of one of the tendons in your hand. Please continue to use splint as tolerated. I do think that a steroid injection would be helpful for you.  Blood pressure Please follow-up next week and bring you medications in at that time. Current medications include: amlodipine-olmesartn 10-40 mg, metoprolol 50 mg, aldactone '25mg'$ .  Diabetes I am rechecking blood work today.  Depression/ grief I think you would benefit from talking with some about what you are going through. Please reach out to [facility] behavioral medicine at [phone].  I would like to do another bone scan.  I have ordered the following labs for you:  Lab Orders         BMP8+Anion Gap         Vitamin D (25 hydroxy)         POC Hbg A1C       Referrals ordered today:   Referral Orders  No referral(s) requested today     I have ordered the following medication/changed the following medications:   Stop the following medications: Medications Discontinued During This Encounter  Medication Reason   alendronate (FOSAMAX) 70 MG tablet Completed Course   doxycycline (VIBRAMYCIN) 100 MG capsule Completed Course   benzonatate (TESSALON PERLES) 100 MG capsule Completed Course   hydrochlorothiazide (MICROZIDE) 12.5 MG capsule Change in therapy   azithromycin (ZITHROMAX) 500 MG tablet Completed Course     Start the following medications: No orders of the defined types were placed in this encounter.    Follow up:  1 month    We look forward to seeing you next time. Please call our clinic at [phone] if you have any questions or concerns. The best time to call is Monday-Friday from 9am-4pm, but there is someone available 24/7. If after hours or the weekend, call the main hospital number and ask for the Internal Medicine Resident On-Call. If you need medication refills, please notify your pharmacy one  week in advance and they will send [place] a request.   Thank you for trusting me with your care. Wishing you the best!   Christiana Fuchs, [place]

## 2021-10-11 ENCOUNTER — Ambulatory Visit (INDEPENDENT_AMBULATORY_CARE_PROVIDER_SITE_OTHER): Payer: Medicare HMO

## 2021-10-11 DIAGNOSIS — J4550 Severe persistent asthma, uncomplicated: Secondary | ICD-10-CM | POA: Diagnosis not present

## 2021-10-11 LAB — BMP8+ANION GAP
Anion Gap: 14 mmol/L (ref 10.0–18.0)
BUN/Creatinine Ratio: 9 — ABNORMAL LOW (ref 12–28)
BUN: 7 mg/dL — ABNORMAL LOW (ref 8–27)
CO2: 23 mmol/L (ref 20–29)
Calcium: 9.5 mg/dL (ref 8.7–10.3)
Chloride: 103 mmol/L (ref 96–106)
Creatinine, Ser: 0.74 mg/dL (ref 0.57–1.00)
Glucose: 120 mg/dL — ABNORMAL HIGH (ref 70–99)
Potassium: 3.8 mmol/L (ref 3.5–5.2)
Sodium: 140 mmol/L (ref 134–144)
eGFR: 82 mL/min/{1.73_m2} (ref >59)

## 2021-10-11 LAB — VITAMIN D 25 HYDROXY (VIT D DEFICIENCY, FRACTURES): Vit D, 25-Hydroxy: 24 ng/mL — ABNORMAL LOW (ref 30.0–100.0)

## 2021-10-11 NOTE — Assessment & Plan Note (Signed)
Patient previously took Fosamax for 2-3 years. DEXA scan in 2017 with T score -3.00.  A/P: Repeat DEXA Checking Vitamin D  Addendum 9/7: Vitamin D at 24 consistent with vitamin d insufficiency. Unable to reach patient at this time to review results. Medication list includes Vitamin D at 50,000 units every 7 days. Unsure if patient has been taking this medication.

## 2021-10-11 NOTE — Assessment & Plan Note (Signed)
Lab Results  Component Value Date   HGBA1C 7.4 (A) 10/10/2021   Patient states that she does not regularly take empagliflozin 10 mg. Repeat HgbA1c at 7.4 which is the same as prior. She denies side effects with medication. P: Continue empagliflozin 10 mg

## 2021-10-11 NOTE — Progress Notes (Signed)
Subjective:  CC: follow-up  HPI:  Andrea Lowery is a 81 y.o. female with a past medical history stated below and presents today for for follow-up on hypertension and deQuervains tenosynovitis. Please see problem based assessment and plan for additional details.  Past Medical History:  Diagnosis Date   Arthritis    Asthma    Diabetes mellitus without complication ([place])    Diabetes II   DVT (deep venous thrombosis) ([place])    "Years ago"   Dyslipidemia    Eczema    GERD (gastroesophageal reflux disease)    Headache    Migraine   Hypertension    Hypothyroidism    LBBB (left bundle branch block)    MGUS (monoclonal gammopathy of unknown significance)    MGUS (monoclonal gammopathy of unknown significance)    Migraine    OSA on CPAP     Current Outpatient Medications on File Prior to Visit  Medication Sig Dispense Refill   albuterol (VENTOLIN HFA) 108 (90 Base) MCG/ACT inhaler Inhale 2 puffs into the lungs every 4 (four) hours as needed for wheezing or shortness of breath. 18 g 1   Alcohol Swabs (DROPSAFE ALCOHOL PREP) 70 % PADS USE TOPICALLY AS NEEDED AS DIRECTED 200 each 1   amLODipine-olmesartan (AZOR) 10-40 MG tablet Take 1 tablet by mouth daily. 90 tablet 3   Benralizumab (FASENRA PEN) 30 MG/ML SOAJ Inject 1 mL (30 mg total) into the skin every 8 (eight) weeks. 1 mL 6   bimatoprost (LATISSE) 0.03 % ophthalmic solution Place 1 applicator into both eyes at bedtime. Place one drop on applicator and apply evenly along the skin of the upper eyelid at base of eyelashes once daily at bedtime; repeat procedure for second eye (use a clean applicator). 5 mL 4   Blood Glucose Monitoring Suppl (TRUE METRIX METER) w/Device KIT Use as instructed 1 kit 1   budesonide-formoterol (SYMBICORT) 160-4.5 MCG/ACT inhaler INHALE 2 PUFFS BY MOUTH INTO THE LUNGS TWICE DAILY RINSE GARGLE AND SPIT AFTER USE 10.2 g 0   carbamazepine (TEGRETOL XR) 200 MG 12 hr tablet Take 1 tablet (200 mg total)  by mouth daily. 30 tablet 0   Dapsone 5 % topical gel Apply 1 application. topically at bedtime. 60 g 4   diclofenac Sodium (VOLTAREN) 1 % GEL Apply to left wrist/thumb up to 4 times daily 100 g 2   DULoxetine (CYMBALTA) 30 MG capsule Take 2 capsules (60 mg total) by mouth daily. 60 capsule 2   Elastic Bandages & Supports (THUMB SPLINT/LEFT MEDIUM) MISC Apply to left thumb 1 each 1   empagliflozin (JARDIANCE) 10 MG TABS tablet Take 1 tablet (10 mg total) by mouth daily. 90 tablet 3   EPINEPHrine 0.3 mg/0.3 mL IJ SOAJ injection Use as directed for severe allergic reaction 2 each 1   EPINEPHrine 0.3 mg/0.3 mL IJ SOAJ injection Inject 0.3 mg into the muscle as needed for anaphylaxis. 1 each 0   esomeprazole (NEXIUM) 40 MG capsule Take 1 capsule (40 mg total) by mouth 2 (two) times daily. 180 capsule 1   fluticasone (FLONASE) 50 MCG/ACT nasal spray Place 2 sprays into both nostrils daily. 48 g 1   glucose blood (TRUE METRIX BLOOD GLUCOSE TEST) test strip Use as instructed 100 each 12   hydrocortisone cream 1 % Apply to affected area 2 times daily 15 g 0   hydrOXYzine (ATARAX) 10 MG tablet TAKE 1 TABLET THREE TIMES DAILY AS NEEDED FOR  ITCHING 30 tablet 1  ipratropium-albuterol (DUONEB) 0.5-2.5 (3) MG/3ML SOLN Take 3 mLs by nebulization every 6 (six) hours as needed. 360 mL 2   ketoconazole (NIZORAL) 2 % cream      levothyroxine (SYNTHROID) 50 MCG tablet TAKE 1 TABLET EVERY DAY (NEED MD APPOINTMENT FOR REFILLS) 90 tablet 0   metoprolol succinate (TOPROL XL) 50 MG 24 hr tablet Take 1 tablet (50 mg total) by mouth daily. Take with or immediately following a meal. 30 tablet 11   montelukast (SINGULAIR) 10 MG tablet Take 1 tablet (10 mg total) by mouth at bedtime. 90 tablet 1   rosuvastatin (CRESTOR) 40 MG tablet TAKE 1 TABLET EVERY DAY (NEED MD APPOINTMENT FOR REFILLS) 90 tablet 0   spironolactone (ALDACTONE) 25 MG tablet Take 1 tablet (25 mg total) by mouth daily. 30 tablet 11   topiramate (TOPAMAX) 50  MG tablet Take 1 tablet (50 mg total) by mouth daily. 90 tablet 3   triamcinolone cream (KENALOG) 0.1 % Apply 1 application topically daily. 80 g 2   TRUEplus Lancets 33G MISC USE AS DIRECTED 200 each 3   Current Facility-Administered Medications on File Prior to Visit  Medication Dose Route Frequency Provider Last Rate Last Admin   Benralizumab SOSY 30 mg  30 mg Subcutaneous Q8 Weeks Kozlow, Donnamarie Poag, MD   30 mg at 10/11/21 1331    Family History  Problem Relation Age of Onset   Diabetes Father    Heart failure Father    Hypertension Father    Sleep apnea Father    Thyroid disease Sister    Hypertension Sister     Social History   Socioeconomic History   Marital status: Divorced    Spouse name: Not on file   Number of children: Not on file   Years of education: Not on file   Highest education level: Not on file  Occupational History   Not on file  Tobacco Use   Smoking status: Former    Types: Cigarettes    Quit date: 02/04/2006    Years since quitting: 15.7   Smokeless tobacco: Never  Vaping Use   Vaping Use: Never used  Substance and Sexual Activity   Alcohol use: No    Alcohol/week: 0.0 standard drinks of alcohol   Drug use: No   Sexual activity: Not on file  Other Topics Concern   Not on file  Social History Narrative   Lives with sister in a one story home.  Has one child.  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     Social Determinants of Health   Financial Resource Strain: Not on file  Food Insecurity: Not on file  Transportation Needs: Not on file  Physical Activity: Not on file  Stress: Not on file  Social Connections: Not on file  Intimate Partner Violence: Not on file    Review of Systems: ROS negative except for what is noted on the assessment and plan.  Objective:   Vitals:   10/10/21 1512 10/10/21 1606  BP: (!) 163/79 (!) 158/75  Pulse: 72 68  Temp: 98.3 F (36.8 C)   TempSrc: Oral   SpO2: 97%   Weight: 189 lb (85.7 kg)   Height: 5' 6"  (1.676 m)      Physical Exam: Constitutional: well-appearing, in no acute distress Cardiovascular: regular rate and rhythm, no m/r/g MSK: Finkelstein positive. Tenderness to palpation over abductor pollicus longus, limited radial deviation of hand due to pain. Neurological: alert & oriented x 3 Skin: warm and dry Psych: normal mood and affect  Assessment & Plan:  Osteoporosis without current pathological fracture Patient previously took Fosamax for 2-3 years. DEXA scan in 2017 with T score -3.00.  A/P: Repeat DEXA Checking Vitamin D  Addendum 9/7: Vitamin D at 24 consistent with vitamin d insufficiency. Unable to reach patient at this time to review results. Medication list includes Vitamin D at 50,000 units every 7 days. Unsure if patient has been taking this medication.  Essential hypertension Patient presenting for follow-up on blood pressure. Blood pressure remains uncontrolled at 163/79 to 158/75. At home checks her blood pressure at home and it has been in 160s/80s. She denies headache, shortness of breath, and chest pain. Her medication were changed at last office visit. Current medications include amlodipine-olmesartan 10-40 mg, metoprolol was increased from 25 to 50 mg, aldactone 25 mg started, hydralazine was discontinued and HCTZ discontinued. She states that she has been taking amlodipine-olmesartan 10-40 mg. She is unsure what dose of metoprolol she is taking. She did not pick up aldactone and is not sure is she is taking HCTZ or hydralazine currently, but know that she ran out of one. A/P: Her blood pressure remains uncontrolled today. I am unsure of what medications she is taking and asked her to bring in medications at follow-up visit in 1 week.  -BMP  Addendum: BMP with potassium of 3.8.  De Quervain's tenosynovitis, left She has continued to have pain in her left wrist. She has tried tylenol and splint with minimal relief. In the past her pain resolved with steroid injection.  She is not interested in repeating this because she does not like needles. Finkelstein positive. Tenderness to palpation over abductor pollicus longus, limited radial deviation of hand due to pain. P: Conservative therapy has not improved symptoms. At this point I would recommend her try another steroid injection, but she like to continue with conservative treatment for now.  Type 2 diabetes mellitus (HCC) Lab Results  Component Value Date   HGBA1C 7.4 (A) 10/10/2021   Patient states that she does not regularly take empagliflozin 10 mg. Repeat HgbA1c at 7.4 which is the same as prior. She denies side effects with medication. P: Continue empagliflozin 10 mg  Vitamin D insufficiency Addendum 9/11: Vitamin D at 24 consistent with insufficiency. She previously took 50,000 units but has not taken this in several years. A/P: Vitamin D 1000 units sent to pharmacy.   Patient discussed with Dr. Juluis Rainier Markavious Micco, D.O. [facility]  PGY-2 Pager: [phone]  Phone: [phone] Date 10/15/2021  Time 5:37 PM

## 2021-10-11 NOTE — Assessment & Plan Note (Signed)
She has continued to have pain in her left wrist. She has tried tylenol and splint with minimal relief. In the past her pain resolved with steroid injection. She is not interested in repeating this because she does not like needles. Finkelstein positive. Tenderness to palpation over abductor pollicus longus, limited radial deviation of hand due to pain. P: Conservative therapy has not improved symptoms. At this point I would recommend her try another steroid injection, but she like to continue with conservative treatment for now.

## 2021-10-11 NOTE — Assessment & Plan Note (Signed)
Patient presenting for follow-up on blood pressure. Blood pressure remains uncontrolled at 163/79 to 158/75. At home checks her blood pressure at home and it has been in 160s/80s. She denies headache, shortness of breath, and chest pain. Her medication were changed at last office visit. Current medications include amlodipine-olmesartan 10-40 mg, metoprolol was increased from 25 to 50 mg, aldactone 25 mg started, hydralazine was discontinued and HCTZ discontinued. She states that she has been taking amlodipine-olmesartan 10-40 mg. She is unsure what dose of metoprolol she is taking. She did not pick up aldactone and is not sure is she is taking HCTZ or hydralazine currently, but know that she ran out of one. A/P: Her blood pressure remains uncontrolled today. I am unsure of what medications she is taking and asked her to bring in medications at follow-up visit in 1 week.  -BMP  Addendum: BMP with potassium of 3.8.

## 2021-10-12 NOTE — Progress Notes (Signed)
Internal Medicine Clinic Attending  Case discussed with the resident at the time of the visit.  We reviewed the resident's history and exam and pertinent patient test results.  I agree with the assessment, diagnosis, and plan of care documented in the resident's note.  

## 2021-10-15 ENCOUNTER — Ambulatory Visit: Payer: Medicare HMO

## 2021-10-15 DIAGNOSIS — E559 Vitamin D deficiency, unspecified: Secondary | ICD-10-CM | POA: Insufficient documentation

## 2021-10-15 MED ORDER — VITAMIN D3 25 MCG (1000 UNIT) PO TABS: 1000.0000 [IU] | ORAL_TABLET | Freq: Every day | ORAL | 1 refills | Status: DC

## 2021-10-15 NOTE — Assessment & Plan Note (Signed)
Addendum 9/11: Vitamin D at 24 consistent with insufficiency. She previously took 50,000 units but has not taken this in several years. A/P: Vitamin D 1000 units sent to pharmacy.

## 2021-10-15 NOTE — Addendum Note (Signed)
Addended by: Edwyna Perfect on: 10/15/2021 05:37 PM   Modules accepted: Orders

## 2021-10-22 ENCOUNTER — Ambulatory Visit (INDEPENDENT_AMBULATORY_CARE_PROVIDER_SITE_OTHER): Payer: Medicare HMO | Admitting: Student

## 2021-10-22 ENCOUNTER — Encounter: Payer: Self-pay | Admitting: Student

## 2021-10-22 VITALS — BP 128/67 | HR 91 | Temp 97.7°F | Ht 66.0 in | Wt 186.2 lb

## 2021-10-22 DIAGNOSIS — Z87891 Personal history of nicotine dependence: Secondary | ICD-10-CM | POA: Diagnosis not present

## 2021-10-22 DIAGNOSIS — R052 Subacute cough: Secondary | ICD-10-CM

## 2021-10-22 DIAGNOSIS — I10 Essential (primary) hypertension: Secondary | ICD-10-CM

## 2021-10-22 DIAGNOSIS — Z0000 Encounter for general adult medical examination without abnormal findings: Secondary | ICD-10-CM

## 2021-10-22 DIAGNOSIS — F4329 Adjustment disorder with other symptoms: Secondary | ICD-10-CM

## 2021-10-22 DIAGNOSIS — R059 Cough, unspecified: Secondary | ICD-10-CM | POA: Insufficient documentation

## 2021-10-22 MED ORDER — HYDROXYZINE HCL 25 MG PO TABS: 25.0000 mg | ORAL_TABLET | Freq: Every day | ORAL | 1 refills | Status: DC

## 2021-10-22 NOTE — Patient Instructions (Signed)
Today we discussed Grief, blood pressure, and cough.  For your grief and difficulty sleeping I am going to prescribe a course of medicine called sertraline.  You will take this once a day.  It may take a long time to begin working.  I also recommend using hydroxyzine at bedtime if you are feeling anxious or upset.  This medicine may help you sleep.  For your blood pressure, I have no changes to recommend.  You are doing a great job.  For your cough I recommend trying a medicine called Delsym (dextromethorphan).  You can take this at bedtime.  It be a little bit sedating, especially if used in combination with your other medicines, so I would recommend using it only before bed.  Return to the clinic in 3 months for a follow-up visit with Dr. Saverio Danker.    Please call our clinic at [phone] Monday through Friday from 9 am to 4 pm if you have questions or concerns about your health. If after hours or on the weekend, call the main hospital number and ask for the Internal Medicine Resident On-Call. If you need medication refills, please notify your pharmacy one week in advance and they will send [place] a request.   Best, Nani Gasser, [place]

## 2021-10-22 NOTE — Progress Notes (Signed)
Subjective:  Reason for visit: Follow up for diabetes and hypertension  HPI:  [name] is a 81 y.o. 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 Please see problem based assessment and plan for additional details.  Past Medical History:  Diagnosis Date   Arthritis    Asthma    Diabetes mellitus without complication ([place])    Diabetes II   DVT (deep venous thrombosis) ([place])    "Years ago"   Dyslipidemia    Eczema    GERD (gastroesophageal reflux disease)    Headache    Migraine   Hypertension    Hypothyroidism    LBBB (left bundle branch block)    MGUS (monoclonal gammopathy of unknown significance)    MGUS (monoclonal gammopathy of unknown significance)    Migraine    OSA on CPAP     Current Outpatient Medications on File Prior to Visit  Medication Sig Dispense Refill   albuterol (VENTOLIN HFA) 108 (90 Base) MCG/ACT inhaler Inhale 2 puffs into the lungs every 4 (four) hours as needed for wheezing or shortness of breath. 18 g 1   Alcohol Swabs (DROPSAFE ALCOHOL PREP) 70 % PADS USE TOPICALLY AS NEEDED AS DIRECTED 200 each 1   amLODipine-olmesartan (AZOR) 10-40 MG tablet Take 1 tablet by mouth daily. 90 tablet 3   Benralizumab (FASENRA PEN) 30 MG/ML SOAJ Inject 1 mL (30 mg total) into the skin every 8 (eight) weeks. 1 mL 6   bimatoprost (LATISSE) 0.03 % ophthalmic solution Place 1 applicator into both eyes at bedtime. Place one drop on applicator and apply evenly along the skin of the upper eyelid at base of eyelashes once daily at bedtime; repeat procedure for second eye (use a clean applicator). 5 mL 4   Blood Glucose Monitoring Suppl (TRUE METRIX METER) w/Device KIT Use as instructed 1 kit 1   budesonide-formoterol (SYMBICORT) 160-4.5 MCG/ACT inhaler INHALE 2 PUFFS BY MOUTH INTO THE LUNGS TWICE DAILY RINSE GARGLE AND  SPIT AFTER USE 10.2 g 0   carbamazepine (TEGRETOL XR) 200 MG 12 hr tablet Take 1 tablet (200 mg total) by mouth daily. 30 tablet 0   cholecalciferol (VITAMIN D3) 25 MCG (1000 UNIT) tablet Take 1 tablet (1,000 Units total) by mouth daily. 90 tablet 1   Dapsone 5 % topical gel Apply 1 application. topically at bedtime. 60 g 4   diclofenac Sodium (VOLTAREN) 1 % GEL Apply to left wrist/thumb up to 4 times daily 100 g 2   DULoxetine (CYMBALTA) 30 MG capsule Take 2 capsules (60 mg total) by mouth daily. 60 capsule 2   Elastic Bandages & Supports (THUMB SPLINT/LEFT MEDIUM) MISC Apply to left thumb 1 each 1   empagliflozin (JARDIANCE) 10 MG TABS tablet Take 1 tablet (10 mg total) by mouth daily. 90 tablet 3   EPINEPHrine 0.3 mg/0.3 mL IJ SOAJ injection Use as directed for severe allergic reaction 2 each 1   EPINEPHrine 0.3 mg/0.3 mL IJ SOAJ injection Inject 0.3 mg into the muscle as needed for anaphylaxis. 1 each 0   esomeprazole (NEXIUM) 40 MG capsule Take 1 capsule (40 mg total) by mouth 2 (two) times daily. 180 capsule 1   fluticasone (FLONASE) 50 MCG/ACT nasal spray Place 2 sprays into both nostrils daily. 48 g 1   glucose blood (TRUE METRIX BLOOD GLUCOSE TEST) test strip Use as instructed 100  each 12   hydrocortisone cream 1 % Apply to affected area 2 times daily 15 g 0   ipratropium-albuterol (DUONEB) 0.5-2.5 (3) MG/3ML SOLN Take 3 mLs by nebulization every 6 (six) hours as needed. 360 mL 2   ketoconazole (NIZORAL) 2 % cream      levothyroxine (SYNTHROID) 50 MCG tablet TAKE 1 TABLET EVERY DAY (NEED MD APPOINTMENT FOR REFILLS) 90 tablet 0   metoprolol succinate (TOPROL XL) 50 MG 24 hr tablet Take 1 tablet (50 mg total) by mouth daily. Take with or immediately following a meal. 30 tablet 11   montelukast (SINGULAIR) 10 MG tablet Take 1 tablet (10 mg total) by mouth at bedtime. 90 tablet 1   rosuvastatin (CRESTOR) 40 MG tablet TAKE 1 TABLET EVERY DAY (NEED MD APPOINTMENT FOR REFILLS) 90 tablet 0    spironolactone (ALDACTONE) 25 MG tablet Take 1 tablet (25 mg total) by mouth daily. 30 tablet 11   topiramate (TOPAMAX) 50 MG tablet Take 1 tablet (50 mg total) by mouth daily. 90 tablet 3   triamcinolone cream (KENALOG) 0.1 % Apply 1 application topically daily. 80 g 2   TRUEplus Lancets 33G MISC USE AS DIRECTED 200 each 3   Current Facility-Administered Medications on File Prior to Visit  Medication Dose Route Frequency Provider Last Rate Last Admin   Benralizumab SOSY 30 mg  30 mg Subcutaneous Q8 Weeks Kozlow, Donnamarie Poag, MD   30 mg at 10/11/21 1331    Family History  Problem Relation Age of Onset   Diabetes Father    Heart failure Father    Hypertension Father    Sleep apnea Father    Thyroid disease Sister    Hypertension Sister     Social History   Socioeconomic History   Marital status: Divorced    Spouse name: Not on file   Number of children: Not on file   Years of education: Not on file   Highest education level: Not on file  Occupational History   Not on file  Tobacco Use   Smoking status: Former    Types: Cigarettes    Quit date: 02/04/2006    Years since quitting: 15.7   Smokeless tobacco: Never  Vaping Use   Vaping Use: Never used  Substance and Sexual Activity   Alcohol use: No    Alcohol/week: 0.0 standard drinks of alcohol   Drug use: No   Sexual activity: Not on file  Other Topics Concern   Not on file  Social History Narrative   Lives with sister in a one story home.  Has one child.  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     Social Determinants of Health   Financial Resource Strain: Not on file  Food Insecurity: Not on file  Transportation Needs: Not on file  Physical Activity: Not on file  Stress: Not on file  Social Connections: Not on file  Intimate Partner Violence: Not on file    Review of Systems: ROS negative except for what is noted on the assessment and plan.  Objective:   Vitals:   10/22/21 1004  BP: 128/67  Pulse: 91  Temp: 97.7 F (36.5 C)   TempSrc: Oral  SpO2: 99%  Weight: 186 lb 3.2 oz (84.5 kg)  Height: 5' 6" (1.676 m)    Physical Exam Constitutional:      General: She is not in acute distress.    Appearance: Normal appearance.  Cardiovascular:     Rate and Rhythm: Normal rate and regular rhythm.  Pulses: Normal pulses.  Pulmonary:     Effort: Pulmonary effort is normal.     Breath sounds: Normal breath sounds. No stridor.  Abdominal:     Palpations: Abdomen is soft.     Tenderness: There is no abdominal tenderness.  Musculoskeletal:     Right lower leg: No edema.     Left lower leg: No edema.  Lymphadenopathy:     Cervical: No cervical adenopathy.  Skin:    General: Skin is warm and dry.  Neurological:     Mental Status: She is alert. Mental status is at baseline.  Psychiatric:        Mood and Affect: Mood normal.        Behavior: Behavior normal.       Assessment & Plan:  Prolonged grief reaction Patient has been struggling ever since the death of her grandson.  She reports that he was killed near her home.  She states that passing by the spot he was killed causes significant distress.  She was only recently able to visit his grave.  She feels like the grief is improving, but it has been severe for almost a year.  She requests something to "calm her nerves" and make it easier to sleep.  She is currently on 60 mg daily of duloxetine.  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  Her primary concern is being able to avoid the thoughts that make it difficult for her to sleep at night.  She has used hydroxyzine in the past for pruritus.  I recommended increasing her dose of hydroxyzine to 25 mg to be taken before bed for help with acute anxiety and sleep in the setting of complex grief reaction.  We discussed sertraline but because this patient is already taking daily duloxetine, sertraline is not appropriate. - Start taking hydroxyzine 25 mg as needed nightly for anxiety and  sleep.  Healthcare maintenance Foot exam-completed today. Urine ACR-has no sample to donate. Flu vaccine-she plans to get at a later clinic visit Ophthalmology exam-has one scheduled  Subacute cough Nonproductive cough of several weeks duration.  History is notable for recurrent allergic cough.  Patient reports that this feels the same.  However, although mild, does interrupt her sleep at night.  She has tried guaifenesin, and uses Flonase regularly.  On exam her lungs are clear and her work of breathing is normal.  Nonproductive cough without risk factors that patient states is similar to her allergic cough.  I recommended a trial of dextromethorphan, which patient is amenable to obtaining over-the-counter.  Essential hypertension Patient presents for follow-up of high blood pressure.  She is on a regimen of amlodipine-losartan and spironolactone.  She also takes metoprolol and empagliflozin.  However, she is unsure about her medications and the conditions for which they're prescribed. I stressed the importance of her medications with her next time.  However, on exam today her blood pressure is 64/69  81 year old female patient with multiple risk factors for ASCVD event with blood pressure at goal today.  No changes to regimen recommended.  Follow up in 3 months for follow up of chronic conditions and grief.  Patient seen with Dr. Tia Alert, M.D. [facility]  PGY-1 Pager: [phone] Date 10/22/2021  Time 8:43 PM

## 2021-10-22 NOTE — Assessment & Plan Note (Signed)
Patient has been struggling ever since the death of her grandson.  She reports that he was killed near her home.  She states that passing by the spot he was killed causes significant distress.  She was only recently able to visit his grave.  She feels like the grief is improving, but it has been severe for almost a year.  She requests something to "calm her nerves" and make it easier to sleep.  She is currently on 60 mg daily of duloxetine.  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  Her primary concern is being able to avoid the thoughts that make it difficult for her to sleep at night.  She has used hydroxyzine in the past for pruritus.  I recommended increasing her dose of hydroxyzine to 25 mg to be taken before bed for help with acute anxiety and sleep in the setting of complex grief reaction.  We discussed sertraline but because this patient is already taking daily duloxetine, sertraline is not appropriate. - Start taking hydroxyzine 25 mg as needed nightly for anxiety and sleep.

## 2021-10-22 NOTE — Assessment & Plan Note (Signed)
Nonproductive cough of several weeks duration.  History is notable for recurrent allergic cough.  Patient reports that this feels the same.  However, although mild, does interrupt her sleep at night.  She has tried guaifenesin, and uses Flonase regularly.  On exam her lungs are clear and her work of breathing is normal.  Nonproductive cough without risk factors that patient states is similar to her allergic cough.  I recommended a trial of dextromethorphan, which patient is amenable to obtaining over-the-counter.

## 2021-10-22 NOTE — Assessment & Plan Note (Signed)
Foot exam-completed today. Urine ACR-has no sample to donate. Flu vaccine-she plans to get at a later clinic visit Ophthalmology exam-has one scheduled

## 2021-10-22 NOTE — Assessment & Plan Note (Addendum)
Patient presents for follow-up of high blood pressure.  She is on a regimen of amlodipine-losartan and spironolactone.  She also takes metoprolol and empagliflozin.  However, she is unsure about her medications and the conditions for which they're prescribed. I stressed the importance of her medications with her next time.  However, on exam today her blood pressure is 63/54  81 year old female patient with multiple risk factors for ASCVD event with blood pressure at goal today.  No changes to regimen recommended.

## 2021-10-24 ENCOUNTER — Emergency Department (HOSPITAL_COMMUNITY)
Admission: EM | Admit: 2021-10-24 | Discharge: 2021-10-24 | Disposition: A | Discharge status: Home / Self Care | Payer: Medicare HMO | Attending: Emergency Medicine | Admitting: Emergency Medicine

## 2021-10-24 ENCOUNTER — Emergency Department (HOSPITAL_COMMUNITY): Payer: Medicare HMO

## 2021-10-24 ENCOUNTER — Other Ambulatory Visit: Payer: Self-pay

## 2021-10-24 DIAGNOSIS — Z79899 Other long term (current) drug therapy: Secondary | ICD-10-CM | POA: Diagnosis not present

## 2021-10-24 DIAGNOSIS — R519 Headache, unspecified: Secondary | ICD-10-CM | POA: Diagnosis not present

## 2021-10-24 DIAGNOSIS — I639 Cerebral infarction, unspecified: Secondary | ICD-10-CM | POA: Diagnosis not present

## 2021-10-24 DIAGNOSIS — Z20822 Contact with and (suspected) exposure to covid-19: Secondary | ICD-10-CM | POA: Diagnosis not present

## 2021-10-24 DIAGNOSIS — I10 Essential (primary) hypertension: Secondary | ICD-10-CM | POA: Insufficient documentation

## 2021-10-24 DIAGNOSIS — E119 Type 2 diabetes mellitus without complications: Secondary | ICD-10-CM | POA: Diagnosis not present

## 2021-10-24 DIAGNOSIS — R42 Dizziness and giddiness: Secondary | ICD-10-CM | POA: Insufficient documentation

## 2021-10-24 DIAGNOSIS — Z7984 Long term (current) use of oral hypoglycemic drugs: Secondary | ICD-10-CM | POA: Diagnosis not present

## 2021-10-24 LAB — CBC
HCT: 38.3 % (ref 36.0–46.0)
Hemoglobin: 12.9 g/dL (ref 12.0–15.0)
MCH: 28 pg (ref 26.0–34.0)
MCHC: 33.7 g/dL (ref 30.0–36.0)
MCV: 83.3 fL (ref 80.0–100.0)
Platelets: 214 10*3/uL (ref 150–400)
RBC: 4.6 MIL/uL (ref 3.87–5.11)
RDW: 15 % (ref 11.5–15.5)
WBC: 4.6 10*3/uL (ref 4.0–10.5)
nRBC: 0 % (ref 0.0–0.2)

## 2021-10-24 LAB — COMPREHENSIVE METABOLIC PANEL
ALT: 27 U/L (ref 0–44)
AST: 29 U/L (ref 15–41)
Albumin: 4.2 g/dL (ref 3.5–5.0)
Alkaline Phosphatase: 83 U/L (ref 38–126)
Anion gap: 6 (ref 5–15)
BUN: 10 mg/dL (ref 8–23)
CO2: 24 mmol/L (ref 22–32)
Calcium: 9.6 mg/dL (ref 8.9–10.3)
Chloride: 110 mmol/L (ref 98–111)
Creatinine, Ser: 0.87 mg/dL (ref 0.44–1.00)
GFR, Estimated: >60 mL/min (ref >60)
Glucose, Bld: 152 mg/dL — ABNORMAL HIGH (ref 70–99)
Potassium: 3.6 mmol/L (ref 3.5–5.1)
Sodium: 140 mmol/L (ref 135–145)
Total Bilirubin: 0.5 mg/dL (ref 0.3–1.2)
Total Protein: 7.9 g/dL (ref 6.5–8.1)

## 2021-10-24 LAB — RAPID URINE DRUG SCREEN, HOSP PERFORMED
Amphetamines: NOT DETECTED
Barbiturates: NOT DETECTED
Benzodiazepines: NOT DETECTED
Cocaine: NOT DETECTED
Opiates: NOT DETECTED
Tetrahydrocannabinol: NOT DETECTED

## 2021-10-24 LAB — PROTIME-INR
INR: 1 (ref 0.8–1.2)
Prothrombin Time: 12.9 seconds (ref 11.4–15.2)

## 2021-10-24 LAB — DIFFERENTIAL
Abs Immature Granulocytes: 0.01 10*3/uL (ref 0.00–0.07)
Basophils Absolute: 0 10*3/uL (ref 0.0–0.1)
Basophils Relative: 0 %
Eosinophils Absolute: 0 10*3/uL (ref 0.0–0.5)
Eosinophils Relative: 0 %
Immature Granulocytes: 0 %
Lymphocytes Relative: 39 %
Lymphs Abs: 1.8 10*3/uL (ref 0.7–4.0)
Monocytes Absolute: 0.5 10*3/uL (ref 0.1–1.0)
Monocytes Relative: 10 %
Neutro Abs: 2.3 10*3/uL (ref 1.7–7.7)
Neutrophils Relative %: 51 %

## 2021-10-24 LAB — APTT: aPTT: 26 seconds (ref 24–36)

## 2021-10-24 LAB — URINALYSIS, ROUTINE W REFLEX MICROSCOPIC
Bilirubin Urine: NEGATIVE
Glucose, UA: NEGATIVE mg/dL
Hgb urine dipstick: NEGATIVE
Ketones, ur: NEGATIVE mg/dL
Leukocytes,Ua: NEGATIVE
Nitrite: NEGATIVE
Protein, ur: NEGATIVE mg/dL
Specific Gravity, Urine: 1.009 (ref 1.005–1.030)
pH: 6 (ref 5.0–8.0)

## 2021-10-24 LAB — RESP PANEL BY RT-PCR (FLU A&B, COVID) ARPGX2
Influenza A by PCR: NEGATIVE
Influenza B by PCR: NEGATIVE
SARS Coronavirus 2 by RT PCR: NEGATIVE

## 2021-10-24 LAB — ETHANOL: Alcohol, Ethyl (B): <10 mg/dL (ref <10)

## 2021-10-24 MED ORDER — DIAZEPAM 5 MG/ML IJ SOLN
5.0000 mg | Freq: Once | INTRAMUSCULAR | Status: AC | PRN
  Administered 2021-10-24: 5 mg via INTRAVENOUS
  Filled 2021-10-24: qty 2

## 2021-10-24 MED ORDER — SODIUM CHLORIDE 0.9 % IV BOLUS
500.0000 mL | Freq: Once | INTRAVENOUS | Status: AC
  Administered 2021-10-24: 500 mL via INTRAVENOUS

## 2021-10-24 MED ORDER — MECLIZINE HCL 12.5 MG PO TABS: 12.5000 mg | ORAL_TABLET | Freq: Three times a day (TID) | ORAL | 0 refills | Status: DC | PRN

## 2021-10-24 MED ORDER — GADOBUTROL 1 MMOL/ML IV SOLN
8.5000 mL | Freq: Once | INTRAVENOUS | Status: AC | PRN
  Administered 2021-10-24: 8.5 mL via INTRAVENOUS

## 2021-10-24 MED ORDER — LORAZEPAM 2 MG/ML IJ SOLN: 1.0000 mg | Freq: Four times a day (QID) | INTRAMUSCULAR | Status: DC | PRN

## 2021-10-24 NOTE — ED Notes (Signed)
Pt reports being claustrophobic and needing pre meds for MRI, MD aware

## 2021-10-24 NOTE — ED Notes (Signed)
Per family patient has fallen multiple times at home and had unsteady gate. MD [place] aware

## 2021-10-24 NOTE — ED Triage Notes (Addendum)
BIBA C/O headache x3 days. Seen PCP on Monday due to increase stress/grief from death of grandson Hx HTN

## 2021-10-24 NOTE — ED Provider Notes (Signed)
  Physical Exam  BP (!) 142/78   Pulse 61   Temp 99.2 F (37.3 C)   Resp 20   Ht '5\' 6"'$  (1.676 m)   Wt 84 kg   SpO2 97%   BMI 29.89 kg/m   Physical Exam  Procedures  Procedures  ED Course / MDM    Medical Decision Making Care assumed at 4 PM.  Patient is here with dizziness.  Signout pending MRI brain.  I examined the patient and she also has some left-sided neck pain radiating down the left arm.  We will add on MRI of the cervical spine as well.  8:56 PM I reviewed patient's labs and imaging studies.  MRI brain did not show a stroke.  MRI of the cervical spine showed diffuse spinal stenosis and some peripheral neuropathy.  Patient is ambulated doing well.  Patient does not require any assistance with walking. I think likely peripheral vertigo and she may have some cervical radiculopathy.  She has normal range of motion of the neck.  I will hold off on steroids right now given her age.  We will discharge home with meclizine as needed and referral to neurology for dizziness   Problems Addressed: Vertigo: acute illness or injury  Amount and/or Complexity of Data Reviewed Labs: ordered. Decision-making details documented in ED Course. Radiology: ordered and independent interpretation performed. Decision-making details documented in ED Course. ECG/medicine tests: ordered and independent interpretation performed. Decision-making details documented in ED Course.  Risk Prescription drug management.          Drenda Freeze, MD 10/24/21 [phone]

## 2021-10-24 NOTE — ED Provider Notes (Signed)
[facility] DEPT Provider Note   CSN: [id number] Arrival date & time: 10/24/21  1354     History {Add pertinent medical, surgical, social history, OB history to HPI:1} Chief Complaint  Patient presents with   Headache    Andrea Lowery is a 81 y.o. female.  HPI Patient presents with her daughter who provides much of the history as the patient is a recalcitrant historian.  Seemingly patient has a history of TIA, hypertension, diabetes, and has not consistently taking her medication.  Daughter notes that over the past 3 weeks patient has had new gait difficulty, with occasional headaches, and speech abnormalities.  Patient states that she is fine.    Home Medications Prior to Admission medications   Medication Sig Start Date End Date Taking? Authorizing Provider  albuterol (VENTOLIN HFA) 108 (90 Base) MCG/ACT inhaler Inhale 2 puffs into the lungs every 4 (four) hours as needed for wheezing or shortness of breath. 05/17/21   Timothy Lasso, MD  Alcohol Swabs (DROPSAFE ALCOHOL PREP) 70 % PADS USE TOPICALLY AS NEEDED AS DIRECTED 09/12/21   Axel Filler, MD  amLODipine-olmesartan (AZOR) 10-40 MG tablet Take 1 tablet by mouth daily. 05/17/21 05/12/22  Timothy Lasso, MD  Benralizumab (FASENRA PEN) 30 MG/ML SOAJ Inject 1 mL (30 mg total) into the skin every 8 (eight) weeks. 05/22/21   Timothy Lasso, MD  bimatoprost (LATISSE) 0.03 % ophthalmic solution Place 1 applicator into both eyes at bedtime. Place one drop on applicator and apply evenly along the skin of the upper eyelid at base of eyelashes once daily at bedtime; repeat procedure for second eye (use a clean applicator). 07/04/21   Lavonna Monarch, MD  Blood Glucose Monitoring Suppl (TRUE METRIX METER) w/Device KIT Use as instructed 05/22/21   Timothy Lasso, MD  budesonide-formoterol (SYMBICORT) 160-4.5 MCG/ACT inhaler INHALE 2 PUFFS BY MOUTH INTO THE LUNGS TWICE DAILY RINSE GARGLE AND SPIT  AFTER USE 08/31/20   Kozlow, Donnamarie Poag, MD  carbamazepine (TEGRETOL XR) 200 MG 12 hr tablet Take 1 tablet (200 mg total) by mouth daily. 09/13/20   Sanjuan Dame, MD  cholecalciferol (VITAMIN D3) 25 MCG (1000 UNIT) tablet Take 1 tablet (1,000 Units total) by mouth daily. 10/15/21   Masters, Katie, DO  Dapsone 5 % topical gel Apply 1 application. topically at bedtime. 07/04/21   Lavonna Monarch, MD  diclofenac Sodium (VOLTAREN) 1 % GEL Apply to left wrist/thumb up to 4 times daily 09/19/21   Lacinda Axon, MD  DULoxetine (CYMBALTA) 30 MG capsule Take 2 capsules (60 mg total) by mouth daily. 07/24/21   Lacinda Axon, MD  Elastic Bandages & Supports (THUMB SPLINT/LEFT MEDIUM) MISC Apply to left thumb 09/19/21   Lacinda Axon, MD  empagliflozin (JARDIANCE) 10 MG TABS tablet Take 1 tablet (10 mg total) by mouth daily. 05/18/21   Timothy Lasso, MD  EPINEPHrine 0.3 mg/0.3 mL IJ SOAJ injection Use as directed for severe allergic reaction 08/16/21   Valentina Shaggy, MD  EPINEPHrine 0.3 mg/0.3 mL IJ SOAJ injection Inject 0.3 mg into the muscle as needed for anaphylaxis. 08/17/21   Mickie Hillier, PA-C  esomeprazole (NEXIUM) 40 MG capsule Take 1 capsule (40 mg total) by mouth 2 (two) times daily. 05/17/21   Timothy Lasso, MD  fluticasone (FLONASE) 50 MCG/ACT nasal spray Place 2 sprays into both nostrils daily. 02/06/21   Kozlow, Donnamarie Poag, MD  glucose blood (TRUE METRIX BLOOD GLUCOSE TEST) test strip Use as instructed 05/22/21  Timothy Lasso, MD  hydrocortisone cream 1 % Apply to affected area 2 times daily 08/17/21   Mickie Hillier, PA-C  hydrOXYzine (ATARAX) 25 MG tablet Take 1 tablet (25 mg total) by mouth at bedtime. Take before bed for anxiety. 10/22/21   Nani Gasser, MD  ipratropium-albuterol (DUONEB) 0.5-2.5 (3) MG/3ML SOLN Take 3 mLs by nebulization every 6 (six) hours as needed. 05/17/21   Timothy Lasso, MD  ketoconazole (NIZORAL) 2 % cream  11/29/19   [provider]   levothyroxine (SYNTHROID) 50 MCG tablet TAKE 1 TABLET EVERY DAY (NEED MD APPOINTMENT FOR REFILLS) 08/02/21   Axel Filler, MD  metoprolol succinate (TOPROL XL) 50 MG 24 hr tablet Take 1 tablet (50 mg total) by mouth daily. Take with or immediately following a meal. 07/24/21 07/24/22  Lacinda Axon, MD  montelukast (SINGULAIR) 10 MG tablet Take 1 tablet (10 mg total) by mouth at bedtime. 02/06/21   Kozlow, Donnamarie Poag, MD  rosuvastatin (CRESTOR) 40 MG tablet TAKE 1 TABLET EVERY DAY (NEED MD APPOINTMENT FOR REFILLS) 08/02/21   Axel Filler, MD  spironolactone (ALDACTONE) 25 MG tablet Take 1 tablet (25 mg total) by mouth daily. 07/24/21 07/24/22  Lacinda Axon, MD  topiramate (TOPAMAX) 50 MG tablet Take 1 tablet (50 mg total) by mouth daily. 05/17/21 08/15/21  Timothy Lasso, MD  triamcinolone cream (KENALOG) 0.1 % Apply 1 application topically daily. 08/30/20   Lavonna Monarch, MD  TRUEplus Lancets 33G MISC USE AS DIRECTED 09/10/21   Axel Filler, MD      Allergies    Bee venom and Penicillins    Review of Systems   Review of Systems  All other systems reviewed and are negative.   Physical Exam Updated Vital Signs BP (!) 148/58   Pulse 77   Temp 99.1 F (37.3 C) (Oral)   Resp 20   Ht _0  (1.676 m)   Wt 84 kg   SpO2 98%   BMI 29.89 kg/m  Physical Exam Vitals and nursing note reviewed.  Constitutional:      General: She is not in acute distress.    Appearance: She is well-developed.  HENT:     Head: Normocephalic and atraumatic.  Eyes:     Conjunctiva/sclera: Conjunctivae normal.  Cardiovascular:     Rate and Rhythm: Normal rate and regular rhythm.  Pulmonary:     Effort: Pulmonary effort is normal. No respiratory distress.     Breath sounds: Normal breath sounds. No stridor.  Abdominal:     General: There is no distension.  Skin:    General: Skin is warm and dry.  Neurological:     Mental Status: She is alert and oriented to person, place,  and time.     Cranial Nerves: No cranial nerve deficit.     Motor: No weakness or tremor.     Coordination: Coordination abnormal.     Comments: No facial asym, speech has occasional pauses, no obvious dysarthria though  Psychiatric:        Mood and Affect: Mood normal.     ED Results / Procedures / Treatments   Labs (all labs ordered are listed, but only abnormal results are displayed) Labs Reviewed  RESP PANEL BY RT-PCR (FLU A&B, COVID) ARPGX2  PROTIME-INR  APTT  CBC  DIFFERENTIAL  COMPREHENSIVE METABOLIC PANEL  ETHANOL  RAPID URINE DRUG SCREEN, HOSP PERFORMED  URINALYSIS, ROUTINE W REFLEX MICROSCOPIC    EKG None  Radiology No results found.  Procedures Procedures  {Document cardiac monitor, telemetry assessment procedure when appropriate:1}  Medications Ordered in ED Medications - No data to display  ED Course/ Medical Decision Making/ A&P  This patient with a Hx of TIA, hypertension, diabetes  presents to the ED for concern of speech changes, gait abnormality, this involves an extensive number of treatment options, and is a complaint that carries with it a high risk of complications and morbidity.    The differential diagnosis includes ***   Social Determinants of Health:  ***  Additional history obtained:  Additional history and/or information obtained from *** , notable for ***   After the initial evaluation, orders, including: *** were initiated.   Patient placed on Cardiac and Pulse-Oximetry Monitors. The patient was maintained on a cardiac monitor.  The cardiac monitored showed an rhythm of *** The patient was also maintained on pulse oximetry. The readings were typically ***   On repeat evaluation of the patient {resolved/improved/worsened:23923::"improved"}  Lab Tests:  I personally interpreted labs.  The pertinent results include:  ***  Imaging Studies ordered:  I independently visualized and interpreted imaging which showed *** I  agree with the radiologist interpretation  Consultations Obtained:  I requested consultation with the ***,  and discussed lab and imaging findings as well as pertinent plan - they recommend: ***  Dispostion / Final MDM:  After consideration of the diagnostic results and the patient's response to treatment, ***    Final Clinical Impression(s) / ED Diagnoses Final diagnoses:  None    Rx / DC Orders ED Discharge Orders     None

## 2021-10-24 NOTE — Discharge Instructions (Addendum)
Take meclizine for dizziness   Follow-up with neurology  Return to ER if you have worse dizziness, falling, trouble walking

## 2021-10-29 ENCOUNTER — Telehealth: Payer: Self-pay | Admitting: *Deleted

## 2021-10-29 NOTE — Telephone Encounter (Signed)
Patient called in requesting appt for ED f/u. States she had episode while combing her hair where she felt really bad pain in hands and couldn't close fingers. Then had episode of severe hip, leg, and back pain when she tried driving to church. States EMS came to home and BP was 210/?Marland Kitchen Taken to ED and had Brain, and Spine MRIs. Transferred back to [facility] to schedule first available appt.

## 2021-10-30 NOTE — Progress Notes (Signed)
Internal Medicine Clinic Attending  Case discussed with Dr. McLendon  at the time of the visit.  We reviewed the resident's history and exam and pertinent patient test results.  I agree with the assessment, diagnosis, and plan of care documented in the resident's note.  

## 2021-10-31 NOTE — Telephone Encounter (Signed)
Just spoke with patient.  She agreed to come in on Monday 11/05/21 at 8:15 am to see Dr. Charise Killian.

## 2021-11-05 ENCOUNTER — Encounter: Payer: Self-pay | Admitting: Internal Medicine

## 2021-11-05 ENCOUNTER — Ambulatory Visit (INDEPENDENT_AMBULATORY_CARE_PROVIDER_SITE_OTHER): Payer: Medicare HMO | Admitting: Internal Medicine

## 2021-11-05 VITALS — BP 133/67 | HR 78 | Temp 98.4°F | Ht 66.0 in | Wt 190.9 lb

## 2021-11-05 DIAGNOSIS — M810 Age-related osteoporosis without current pathological fracture: Secondary | ICD-10-CM | POA: Diagnosis not present

## 2021-11-05 DIAGNOSIS — J45909 Unspecified asthma, uncomplicated: Secondary | ICD-10-CM

## 2021-11-05 DIAGNOSIS — E785 Hyperlipidemia, unspecified: Secondary | ICD-10-CM

## 2021-11-05 DIAGNOSIS — W19XXXA Unspecified fall, initial encounter: Secondary | ICD-10-CM | POA: Insufficient documentation

## 2021-11-05 DIAGNOSIS — Z0000 Encounter for general adult medical examination without abnormal findings: Secondary | ICD-10-CM

## 2021-11-05 DIAGNOSIS — J4540 Moderate persistent asthma, uncomplicated: Secondary | ICD-10-CM | POA: Insufficient documentation

## 2021-11-05 DIAGNOSIS — E1169 Type 2 diabetes mellitus with other specified complication: Secondary | ICD-10-CM

## 2021-11-05 DIAGNOSIS — G4733 Obstructive sleep apnea (adult) (pediatric): Secondary | ICD-10-CM

## 2021-11-05 DIAGNOSIS — Z87891 Personal history of nicotine dependence: Secondary | ICD-10-CM

## 2021-11-05 DIAGNOSIS — M1990 Unspecified osteoarthritis, unspecified site: Secondary | ICD-10-CM

## 2021-11-05 DIAGNOSIS — I10 Essential (primary) hypertension: Secondary | ICD-10-CM

## 2021-11-05 DIAGNOSIS — G500 Trigeminal neuralgia: Secondary | ICD-10-CM

## 2021-11-05 DIAGNOSIS — G5701 Lesion of sciatic nerve, right lower limb: Secondary | ICD-10-CM | POA: Diagnosis not present

## 2021-11-05 MED ORDER — ACETAMINOPHEN 500 MG PO TABS: 1000.0000 mg | ORAL_TABLET | Freq: Three times a day (TID) | ORAL | 2 refills | Status: AC | PRN

## 2021-11-05 MED ORDER — DICLOFENAC SODIUM 1 % EX GEL: CUTANEOUS | 2 refills | Status: DC

## 2021-11-05 NOTE — Assessment & Plan Note (Signed)
Initial blood pressure above goal, however improved on recheck. [name] reports she did not take her blood pressure medications before today's visit which include amlodipine 10-olmesartan 40 and spironolactone 25. She also takes metoprolol XL 50 and empagliflozin 10 mg daily. [name] reports she takes her blood pressure occasionally at home and it typically runs 150/60s. She notes severe elevation upon recent EMS evaluation although we do not have documentation of this. Given her repeat BP at 133/67 without medications and recent episode of suspected orthostasis, I wonder if she is actually overtreated when taking medications. We will see her back in one month for f/u and encouraged home monitoring. Will adjust medications as needed.  Plan: -Continue current regimen -Monitor home BP for one month, return to clinic with medications for recheck

## 2021-11-05 NOTE — Assessment & Plan Note (Signed)
Symptoms of bilateral small joint hand and wrist pain, worst over the left CMC. 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 Yim did not want injection. Morning stiffness resolves within 30-45 minutes, pain worse with activity. No evidence of synovitis on examination although there are bony changes of her PIPs and DIPs. 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 She is not interested in injections. Recommended topical therapy and acetaminophen.   Plan -Diclofenac gel to small joints of hands and wrist -Acetaminophen 1000 mg tid prn

## 2021-11-05 NOTE — Assessment & Plan Note (Signed)
Received influenza and RSV vaccine at pharmacy. Recommend Tdap and COVID booster. Mammogram completed earlier this month, awaiting records.

## 2021-11-05 NOTE — Assessment & Plan Note (Signed)
Recent sleep study 06/03/21 without evidence of sleep apnea and CPAP therapy not recommended. Will resolve this problem.

## 2021-11-05 NOTE — Assessment & Plan Note (Addendum)
Andrea Lowery describes sharp pain over her right buttock, worse with weight-bearing and a recent episode of severe pain while driving. Previously diagnosed with sacroiliac dysfunction and treated with sacroiliac injection under fluoroscopic guidance, followed with PM&R. We discussed possible contribution to pain from piriformis syndrome and physical therapy would be a reasonable next step for strengthening and balance. I suspect this pain contributed to her recent fall and PT will hopefully help with preventing further falls as well.  Plan -Referral to PT -Continue duloxetine 60 mg daily

## 2021-11-05 NOTE — Assessment & Plan Note (Signed)
Most recent A1C within goal, 7.4%. Repeat due in 01/2022. Andrea Lowery reports she is going to see her eye doctor soon. She is UTD on foot exam. Remains on rosuvastatin, will update lipid panel today. Continue empagliflozin 10 mg daily.  Plan -Empagliflozin 10 mg daily -Repeat A1c 01/2022 -Lipid panel -uACR

## 2021-11-05 NOTE — Assessment & Plan Note (Addendum)
Longstanding history of left sided headaches and facial pain. Previously evaluated by neurology and felt most consistent with trigeminal neuralgia. Ruled out GCA and temporal arteritis on previous evaluation. Andrea Lowery continues to have intermittent left sided facial pain and is not currently on any therapy. I have recommended return to neurology for further management and f/u.  Plan -Referral to neurology  Addendum 10/12 In f/u discussion with Andrea Lowery, she would like to resume carbamazepine for likely trigeminal neuralgia prior to f/u with neurology. Baseline labs obtained 9/20. Resume carbamazepine 200 mg daily, f/u with neurology 12/11/21.

## 2021-11-05 NOTE — Assessment & Plan Note (Addendum)
Ms. Stemmer reported two falls on the day prior to clinic visit. She notes she was at church, wearing heels, and walking down the stairs when her right leg gave out. She did not injure herself during this fall. Suspect this is secondary to right buttock pain from piriformis syndrome vs sacroiliac dysfunction as outlined elsewhere. The second fall also occurred at church during which she tripped over a cord. She does note bumping the left side of her head on the ground during this fall, no other injuries. She denies any new headache, confusion, vision changes, fatigue, or weakness since the injury. Neuro exam normal today. She is not on blood thinners. CT Head Rule only positive for age. No head imaging today, however if she notices any changes in her mentation or other symptoms I have instructed her to call the clinic right away.

## 2021-11-05 NOTE — Assessment & Plan Note (Signed)
History of osteoporosis from DEXA 2017. [name] reports taking Fosamax for 2-3 years. No updated DEXA since that time. Vitamin D level low at last visit (24), [name] reports taking vitamin D 1000 IU daily.  Plan -Recheck vitamin D at fu visit -Reordered DEXA today to [facility]

## 2021-11-05 NOTE — Assessment & Plan Note (Signed)
History of severe persistent asthma, follows with Dr. Neldon Mc. Current therapy includes montelukast, Vernie Murders, and Symbicort which Ms. Morici takes as needed. She is also being treated for reflux with esomeprazole and for allergies with cetirizine and Flonase. No concerns today.

## 2021-11-05 NOTE — Progress Notes (Addendum)
Established Patient Office Visit  Subjective   Patient ID: Andrea Lowery, female    DOB: [birth year]  Age: 81 y.o. MRN: [id number]  Chief Complaint  Patient presents with   Hospitalization Follow-up   Andrea Lowery presents today for follow-up of chronic conditions including well-controlled type 2 DM, hyperlipidemia, and hypertension, as well as first visit with me. She would also like to discuss right buttock pain and joint pain of the hands. She had two falls yesterday at church which sound mechanical. She also reports a recent ED visit for an episode of dizziness that occurred while standing up quickly from a seated position. She also notes her blood pressure was very high on EMS arrival. In the ED, MRI head was negative for any acute changes and MRI cervical spine was positive for spondylosis w/ mild-moderate spinal stenosis and foraminal narrowing. No significant changes on EKG. Given stability and normal neurologic exam, she was discharged to home with meclizine and neurology referral with concern for peripheral vertigo.    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   Subacute cough 10/22/2021   Vitamin D insufficiency 10/15/2021   De Quervain's tenosynovitis, left 09/21/2021   Urinary frequency 07/25/2021   Protrusion of lumbar intervertebral disc 09/13/2019   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   Eczema 08/02/2016   Osteoporosis without current pathological fracture 05/30/2015   Chronic right-sided low back pain with right-sided sciatica 03/31/2015   Type 2 diabetes mellitus ([place]) 63/14/9702   Diastolic CHF ([place]) 63/78/5885   Sacroiliac dysfunction 11/04/2014   GERD (gastroesophageal reflux disease) 10/05/2014   Hypothyroidism 09/21/2014   Anxiety and depression  03/18/2014   Healthcare maintenance 01/25/2014   Hyperlipidemia 12/27/2013   Bilateral leg edema 07/20/2013   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/2013   Essential hypertension 07/01/2012      Objective:     BP 133/67 (BP Location: Left Arm, Patient Position: Sitting, Cuff Size: Normal)   Pulse 78   Temp 98.4 F (36.9 C) (Oral)   Ht '5\' 6"'$  (1.676 m)   Wt 190 lb 14.4 oz (86.6 kg)   SpO2 99%   BMI 30.81 kg/m  BP Readings from Last 3 Encounters:  11/14/21 138/79  11/05/21 133/67  10/24/21 119/78   Wt Readings from Last 3 Encounters:  11/05/21 190 lb 14.4 oz (86.6 kg)  10/24/21 185 lb 3 oz (84 kg)  10/22/21 186 lb 3.2 oz (84.5 kg)    Physical Exam Vitals reviewed.  Constitutional:      General: She is not in acute distress.    Appearance: Normal appearance. She is not ill-appearing.  HENT:     Head: Normocephalic and atraumatic.  Eyes:     Extraocular Movements: Extraocular movements intact.     Conjunctiva/sclera: Conjunctivae normal.     Pupils: Pupils are equal, round, and reactive to light.  Pulmonary:     Effort: Pulmonary effort is normal.  Musculoskeletal:        General: Tenderness (TTP over right buttock) present. No deformity or signs of injury. Normal range of motion.     Right lower leg: Edema present.     Left lower leg: Edema present.  Neurological:     General: No focal deficit present.     Mental Status: She is  alert and oriented to person, place, and time.     Cranial Nerves: No cranial nerve deficit.     Sensory: No sensory deficit.     Motor: No weakness.     Gait: Gait abnormal (antalgic gait).  Psychiatric:        Mood and Affect: Mood normal.        Behavior: Behavior normal.        Thought Content: Thought content normal.        Judgment: Judgment normal.      Assessment & Plan:   Problem List Items Addressed This Visit       Cardiovascular and Mediastinum   Essential hypertension    Initial blood pressure  above goal, however improved on recheck. Andrea Lowery reports she did not take her blood pressure medications before today's visit which include amlodipine 10-olmesartan 40 and spironolactone 25. She also takes metoprolol XL 50 and empagliflozin 10 mg daily. Andrea Lowery reports she takes her blood pressure occasionally at home and it typically runs 150/60s. She notes severe elevation upon recent EMS evaluation although we do not have documentation of this. Given her repeat BP at 133/67 without medications and recent episode of suspected orthostasis, I wonder if she is actually overtreated when taking medications. We will see her back in one month for f/u and encouraged home monitoring. Will adjust medications as needed.  Plan: -Continue current regimen -Monitor home BP for one month, return to clinic with medications for recheck      Relevant Medications   ezetimibe (ZETIA) 10 MG tablet     Respiratory   RESOLVED: OSA on CPAP    Recent sleep study 06/03/21 without evidence of sleep apnea and CPAP therapy not recommended. Will resolve this problem.      Asthma    History of severe persistent asthma, follows with Dr. Neldon Mc. Current therapy includes montelukast, Vernie Murders, and Symbicort which Andrea Lowery takes as needed. She is also being treated for reflux with esomeprazole and for allergies with cetirizine and Flonase. No concerns today.        Endocrine   Type 2 diabetes mellitus ([place]) - Primary    Most recent A1C within goal, 7.4%. Repeat due in 01/2022. [name] reports she is going to see her eye doctor soon. She is UTD on foot exam. Remains on rosuvastatin, will update lipid panel today. Continue empagliflozin 10 mg daily.  Plan -Empagliflozin 10 mg daily -Repeat A1c 01/2022 -Lipid panel -uACR      Relevant Orders   Microalbumin / Creatinine Urine Ratio   Lipid Profile (Completed)     Nervous and Auditory   Trigeminal neuralgia of left side of face    Longstanding  history of left sided headaches and facial pain. Previously evaluated by neurology and felt most consistent with trigeminal neuralgia. Ruled out GCA and temporal arteritis on previous evaluation. Andrea Lowery continues to have intermittent left sided facial pain and is not currently on any therapy. I have recommended return to neurology for further management and f/u.  Plan -Referral to neurology  Addendum 10/12 In f/u discussion with Ms. Tibbetts, she would like to resume carbamazepine for likely trigeminal neuralgia prior to f/u with neurology. Baseline labs obtained 9/20. Resume carbamazepine 200 mg daily, f/u with neurology 12/11/21.      Relevant Medications   carbamazepine (TEGRETOL-XR) 200 MG 12 hr tablet   Other Relevant Orders   Ambulatory referral to Neurology   Piriformis syndrome of right side  Ms. Clum describes sharp pain over her right buttock, worse with weight-bearing and a recent episode of severe pain while driving. Previously diagnosed with sacroiliac dysfunction and treated with sacroiliac injection under fluoroscopic guidance, followed with PM&R. We discussed possible contribution to pain from piriformis syndrome and physical therapy would be a reasonable next step for strengthening and balance. I suspect this pain contributed to her recent fall and PT will hopefully help with preventing further falls as well.  Plan -Referral to PT -Continue duloxetine 60 mg daily      Relevant Medications   carbamazepine (TEGRETOL-XR) 200 MG 12 hr tablet   Other Relevant Orders   Ambulatory referral to Physical Therapy     Musculoskeletal and Integument   Osteoporosis without current pathological fracture    History of osteoporosis from DEXA 2017. Ms. Maines reports taking Fosamax for 2-3 years. No updated DEXA since that time. Vitamin D level low at last visit (24), Ms. Endres reports taking vitamin D 1000 IU daily.  Plan -Recheck vitamin D at fu visit -Reordered DEXA  today to [facility]      Relevant Orders   DG Bone Density   Arthritis    Symptoms of bilateral small joint hand and wrist pain, worst over the left CMC. 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 Bell did not want injection. Morning stiffness resolves within 30-45 minutes, pain worse with activity. No evidence of synovitis on examination although there are bony changes of her PIPs and DIPs. 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 She is not interested in injections. Recommended topical therapy and acetaminophen.   Plan -Diclofenac gel to small joints of hands and wrist -Acetaminophen 1000 mg tid prn      Relevant Medications   acetaminophen (TYLENOL) 500 MG tablet     Other   Healthcare maintenance    Received influenza and RSV vaccine at pharmacy. Recommend Tdap and COVID booster. Mammogram completed earlier this month, awaiting records.      Hyperlipidemia   Relevant Medications   ezetimibe (ZETIA) 10 MG tablet   Other Relevant Orders   Lipid Profile (Completed)   Fall    Ms. Kelsay reported two falls on the day prior to clinic visit. She notes she was at church, wearing heels, and walking down the stairs when her right leg gave out. She did not injure herself during this fall. Suspect this is secondary to right buttock pain from piriformis syndrome vs sacroiliac dysfunction as outlined elsewhere. The second fall also occurred at church during which she tripped over a cord. She does note bumping the left side of her head on the ground during this fall, no other injuries. She denies any new headache, confusion, vision changes, fatigue, or weakness since the injury. Neuro exam normal today. She is not on blood thinners. CT Head Rule only positive for age. No head imaging today, however if she notices any changes in her mentation or other symptoms I have instructed her to call the clinic right away.        Return in about 4 weeks (around 12/03/2021) for f/u BP.    Charise Killian, MD

## 2021-11-05 NOTE — Patient Instructions (Addendum)
It was a pleasure to meet you today Ms. Bann!  I will see you back in one month, please take your blood pressure medications before coming to your next appointment.  For your right back/hip pain, I have referred you to physical therapy.   For arthritis, you may take acetaminophen (Tylenol) up to 1,000 mg three times daily. You may also use the diclofenac (Voltaren) gel on the small joints of your hands and wrists that bother you.  I have sent a referral for neurology and an order for a bone density test.  Please contact your pharmacy to schedule the COVID booster and tetanus (Tdap) vaccine.

## 2021-11-06 LAB — LIPID PANEL
Chol/HDL Ratio: 4.6 ratio — ABNORMAL HIGH (ref 0.0–4.4)
Cholesterol, Total: 184 mg/dL (ref 100–199)
HDL: 40 mg/dL (ref >39)
LDL Chol Calc (NIH): 118 mg/dL — ABNORMAL HIGH (ref 0–99)
Triglycerides: 147 mg/dL (ref 0–149)
VLDL Cholesterol Cal: 26 mg/dL (ref 5–40)

## 2021-11-07 ENCOUNTER — Other Ambulatory Visit: Payer: Self-pay | Admitting: Student

## 2021-11-07 MED ORDER — EZETIMIBE 10 MG PO TABS: 10.0000 mg | ORAL_TABLET | Freq: Every day | ORAL | 11 refills | Status: DC

## 2021-11-07 NOTE — Progress Notes (Signed)
ASCVD risk 31.9%. Chart history of TIA, although I can not find further details of this. Given this history, recommend secondary prevention with LDL goal <70. Recommended consistent rosuvastatin use and will add ezetimibe. Discussed with Andrea Lowery by telephone 11/07/21.

## 2021-11-07 NOTE — Addendum Note (Signed)
Addended by: Charise Killian on: 11/07/2021 12:55 PM   Modules accepted: Orders

## 2021-11-12 ENCOUNTER — Encounter: Payer: Medicare HMO | Admitting: Internal Medicine

## 2021-11-14 ENCOUNTER — Ambulatory Visit
Admission: EM | Admit: 2021-11-14 | Discharge: 2021-11-14 | Disposition: A | Discharge status: Home / Self Care | Payer: Medicare HMO | Attending: Internal Medicine | Admitting: Internal Medicine

## 2021-11-14 DIAGNOSIS — W57XXXA Bitten or stung by nonvenomous insect and other nonvenomous arthropods, initial encounter: Secondary | ICD-10-CM | POA: Diagnosis not present

## 2021-11-14 DIAGNOSIS — S90562A Insect bite (nonvenomous), left ankle, initial encounter: Secondary | ICD-10-CM | POA: Diagnosis not present

## 2021-11-14 DIAGNOSIS — T7840XA Allergy, unspecified, initial encounter: Secondary | ICD-10-CM

## 2021-11-14 MED ORDER — METHYLPREDNISOLONE SODIUM SUCC 125 MG IJ SOLR
80.0000 mg | Freq: Once | INTRAMUSCULAR | Status: AC
  Administered 2021-11-14: 80 mg via INTRAMUSCULAR

## 2021-11-14 NOTE — ED Provider Notes (Signed)
[facility]    CSN: [id number] Arrival date & time: 11/14/21  1741      History   Chief Complaint Chief Complaint  Patient presents with   Insect Bite    HPI Andrea Lowery is a 81 y.o. female.   Patient presents with possible insect bite to left lower ankle that occurred today a few hours prior to arrival.  Patient reports that she is not sure what bit her as she was doing some yard work and felt something bite her anterior ankle.  Patient reports that she has associated swelling to the left ankle which is new.  States that right ankle is typically swollen but left ankle usually is not.  Denies numbness or tingling.  Denies any associated fever.  Patient reports that she has taken Zyrtec with minimal improvement in symptoms.  Denies feelings of shortness of breath or throat closing.  Patient does have EpiPen available at home if necessary.     Past Medical History:  Diagnosis Date   Arthritis    Asthma    Asthma 01/10/2016   Last Assessment & Plan:  After recent ED eval- currrently utilizing prednisone as prescribed. Begin Levaquin, prednisone and Albuterol MDI to be utilized as needed for acute wheezing FU in 2 day is no improvement or worsening of symptoms. Pt verbalizes understanding for current treatment plan. Follow-up in one week. Plan to increase chronic inhaled steroid dose to assess further with prevention of   Diabetes mellitus without complication ([place])    Diabetes II   Difficulty sleeping 12/28/2013   Last Assessment & Plan:  Stable with trazodone nightly. Continue current treatment plan. Refill trazodone today. Tolerating medication well without side effects. Return in 3 months for follow up or sooner if any worsening of symptoms.    DOE (dyspnea on exertion) 07/01/2012   DVT (deep venous thrombosis) ([place])    "Years ago"   Dyslipidemia    Eczema    GERD (gastroesophageal reflux disease)    Headache    Migraine   Hypertension    Hypokalemia  04/30/2016   Hypothyroidism    LBBB (left bundle branch block)    MGUS (monoclonal gammopathy of unknown significance)    MGUS (monoclonal gammopathy of unknown significance)    Migraine    OSA on CPAP     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   Subacute cough 10/22/2021   Vitamin D insufficiency 10/15/2021   De Quervain's tenosynovitis, left 09/21/2021   Urinary frequency 07/25/2021   Protrusion of lumbar intervertebral disc 09/13/2019   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   Eczema 08/02/2016   Osteoporosis without current pathological fracture 05/30/2015   Chronic right-sided low back pain with right-sided sciatica 03/31/2015   Type 2 diabetes mellitus ([place]) 78/29/5621   Diastolic CHF ([place]) 30/86/5784   Sacroiliac dysfunction 11/04/2014   GERD (gastroesophageal reflux disease) 10/05/2014   Hypothyroidism 09/21/2014   Anxiety and depression 03/18/2014   Healthcare maintenance 01/25/2014   Hyperlipidemia 12/27/2013   Bilateral leg edema 07/20/2013   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/2013   Essential hypertension 07/01/2012    Past Surgical History:  Procedure Laterality Date   ABDOMINAL HYSTERECTOMY  1969   ARTERY BIOPSY N/A 11/07/2014   Procedure: BIOPSY TEMPORAL ARTERY;  Surgeon:  Leta Baptist, MD;  Location: [facility] OR;  Service: ENT;  Laterality: N/A;   CARPAL TUNNEL RELEASE Right    CHOLECYSTECTOMY  1976   ENDOVENOUS ABLATION SAPHENOUS VEIN W/ LASER Left 08/24/2020   endovenous laser ablation left greater saphenous vein by Gae Gallop MD   LUMBAR LAMINECTOMY/DECOMPRESSION MICRODISCECTOMY N/A 07/31/2015   Procedure: L4-5 Decompression, Possible Right L4-5 Microdiscectomy;  Surgeon: Marybelle Killings, MD;  Location: [facility];  Service: Orthopedics;   Laterality: N/A;   NM MYOVIEW LTD  02/13/2010   No ischemia   PLANTAR FASCIA RELEASE     TONSILLECTOMY  1965   US ECHOCARDIOGRAPHY  09/20/2008   borderline LVH,mild TR,AOV mildly sclerotic w/ca+ of the leaflets    OB History   No obstetric history on file.      Home Medications    Prior to Admission medications   Medication Sig Start Date End Date Taking? Authorizing Provider  acetaminophen (TYLENOL) 500 MG tablet Take 2 tablets (1,000 mg total) by mouth every 8 (eight) hours as needed for moderate pain. 11/05/21 11/05/22  Charise Killian, MD  albuterol (VENTOLIN HFA) 108 (90 Base) MCG/ACT inhaler Inhale 2 puffs into the lungs every 4 (four) hours as needed for wheezing or shortness of breath. 05/17/21   Timothy Lasso, MD  amLODipine-olmesartan (AZOR) 10-40 MG tablet Take 1 tablet by mouth daily. 05/17/21 05/12/22  Timothy Lasso, MD  Benralizumab (FASENRA PEN) 30 MG/ML SOAJ Inject 1 mL (30 mg total) into the skin every 8 (eight) weeks. 05/22/21   Timothy Lasso, MD  Blood Glucose Monitoring Suppl (TRUE METRIX METER) w/Device KIT Use as instructed 05/22/21   Timothy Lasso, MD  budesonide-formoterol (SYMBICORT) 160-4.5 MCG/ACT inhaler INHALE 2 PUFFS BY MOUTH INTO THE LUNGS TWICE DAILY RINSE GARGLE AND SPIT AFTER USE 08/31/20   Kozlow, Donnamarie Poag, MD  cholecalciferol (VITAMIN D3) 25 MCG (1000 UNIT) tablet Take 1 tablet (1,000 Units total) by mouth daily. 10/15/21   Masters, Joellen Jersey, DO  diclofenac Sodium (VOLTAREN) 1 % GEL Apply to small joints of the hands and wrist up to 4 times daily 11/05/21   Charise Killian, MD  DULoxetine (CYMBALTA) 30 MG capsule TAKE 2 CAPSULES EVERY DAY 11/07/21   Charise Killian, MD  empagliflozin (JARDIANCE) 10 MG TABS tablet Take 1 tablet (10 mg total) by mouth daily. 05/18/21   Timothy Lasso, MD  EPINEPHrine 0.3 mg/0.3 mL IJ SOAJ injection Use as directed for severe allergic reaction 08/16/21   Valentina Shaggy, MD  EPINEPHrine 0.3 mg/0.3 mL IJ SOAJ injection Inject 0.3 mg  into the muscle as needed for anaphylaxis. 08/17/21   Mickie Hillier, PA-C  esomeprazole (NEXIUM) 40 MG capsule Take 1 capsule (40 mg total) by mouth 2 (two) times daily. 05/17/21   Timothy Lasso, MD  ezetimibe (ZETIA) 10 MG tablet Take 1 tablet (10 mg total) by mouth daily. 11/07/21   Charise Killian, MD  fluticasone (FLONASE) 50 MCG/ACT nasal spray Place 2 sprays into both nostrils daily. 02/06/21   Kozlow, Donnamarie Poag, MD  glucose blood (TRUE METRIX BLOOD GLUCOSE TEST) test strip Use as instructed 05/22/21   Timothy Lasso, MD  hydrocortisone cream 1 % Apply to affected area 2 times daily 08/17/21   Mickie Hillier, PA-C  hydrOXYzine (ATARAX) 25 MG tablet Take 1 tablet (25 mg total) by mouth at bedtime. Take before bed for anxiety. 10/22/21   Nani Gasser, MD  ipratropium-albuterol (DUONEB) 0.5-2.5 (3) MG/3ML SOLN Take 3 mLs by nebulization every 6 (six) hours as needed. 05/17/21  Timothy Lasso, MD  levothyroxine (SYNTHROID) 50 MCG tablet TAKE 1 TABLET EVERY DAY (NEED MD APPOINTMENT FOR REFILLS) 08/02/21   Axel Filler, MD  metoprolol succinate (TOPROL XL) 50 MG 24 hr tablet Take 1 tablet (50 mg total) by mouth daily. Take with or immediately following a meal. 07/24/21 07/24/22  Lacinda Axon, MD  montelukast (SINGULAIR) 10 MG tablet Take 1 tablet (10 mg total) by mouth at bedtime. 02/06/21   Kozlow, Donnamarie Poag, MD  rosuvastatin (CRESTOR) 40 MG tablet TAKE 1 TABLET EVERY DAY (NEED MD APPOINTMENT FOR REFILLS) 08/02/21   Axel Filler, MD  spironolactone (ALDACTONE) 25 MG tablet Take 1 tablet (25 mg total) by mouth daily. 07/24/21 07/24/22  Lacinda Axon, MD  topiramate (TOPAMAX) 50 MG tablet Take 1 tablet (50 mg total) by mouth daily. 05/17/21 08/15/21  Timothy Lasso, MD  triamcinolone cream (KENALOG) 0.1 % Apply 1 application topically daily. 08/30/20   Lavonna Monarch, MD  TRUEplus Lancets 33G MISC USE AS DIRECTED 09/10/21   Axel Filler, MD    Family History Family  History  Problem Relation Age of Onset   Diabetes Father    Heart failure Father    Hypertension Father    Sleep apnea Father    Thyroid disease Sister    Hypertension Sister     Social History Social History   Tobacco Use   Smoking status: Former    Types: Cigarettes    Quit date: 02/04/2006    Years since quitting: 15.7   Smokeless tobacco: Never  Vaping Use   Vaping Use: Never used  Substance Use Topics   Alcohol use: No    Alcohol/week: 0.0 standard drinks of alcohol   Drug use: No     Allergies   Bee venom and Penicillins   Review of Systems Review of Systems Per HPI  Physical Exam Triage Vital Signs ED Triage Vitals [11/14/21 1753]  Enc Vitals Group     BP 138/79     Pulse Rate 100     Resp 16     Temp 98.5 F (36.9 C)     Temp Source Oral     SpO2 97 %     Weight      Height      Head Circumference      Peak Flow      Pain Score 7     Pain Loc      Pain Edu?      Excl. in [place]?    No data found.  Updated Vital Signs BP 138/79 (BP Location: Left Arm)   Pulse 100   Temp 98.5 F (36.9 C) (Oral)   Resp 16   SpO2 97%   Visual Acuity Right Eye Distance:   Left Eye Distance:   Bilateral Distance:    Right Eye Near:   Left Eye Near:    Bilateral Near:     Physical Exam Constitutional:      General: She is not in acute distress.    Appearance: Normal appearance. She is not toxic-appearing or diaphoretic.  HENT:     Head: Normocephalic and atraumatic.  Eyes:     Extraocular Movements: Extraocular movements intact.     Conjunctiva/sclera: Conjunctivae normal.  Pulmonary:     Effort: Pulmonary effort is normal.  Skin:    Comments: Patient has circumferential swelling with no discoloration present to left ankle.  Patient also has approximately 1 mm circular, flat erythematous area consistent with possible insect  bite present to anterior mid ankle.  No drainage noted.  Patient can wiggle toes.  Patient has full range of motion of ankle.   Capillary refill and pulses intact.  Neurological:     General: No focal deficit present.     Mental Status: She is alert and oriented to person, place, and time. Mental status is at baseline.  Psychiatric:        Mood and Affect: Mood normal.        Behavior: Behavior normal.        Thought Content: Thought content normal.        Judgment: Judgment normal.      UC Treatments / Results  Labs (all labs ordered are listed, but only abnormal results are displayed) Labs Reviewed - No data to display  EKG   Radiology No results found.  Procedures Procedures (including critical care time)  Medications Ordered in UC Medications  methylPREDNISolone sodium succinate (SOLU-MEDROL) 125 mg/2 mL injection 80 mg (80 mg Intramuscular Given 11/14/21 1810)    Initial Impression / Assessment and Plan / UC Course  I have reviewed the triage vital signs and the nursing notes.  Pertinent labs & imaging results that were available during my care of the patient were reviewed by me and considered in my medical decision making (see chart for details).     Patient appears to be having an allergic reaction that is localized to left ankle with associated swelling and possible insect bite.  Will treat with IM steroid here in urgent care.  Patient has taken steroid before and tolerated well.  No obvious contraindication to steroid noted in patient's history.  Did recommend the patient monitor glucose with a home glucose monitor for the next few days and patient voiced understanding of this.  Advised of safe antihistamines as well.  Patient advised of cool compresses.  Patient has EpiPen available if symptoms persist or worsen.  No current signs of shortness of breath, stridor, throat closing noted on exam but the patient was encouraged to use EpiPen if these symptoms occur.  Discussed return and ER precautions.  Patient verbalized understanding and was agreeable with plan. Final Clinical Impressions(s) /  UC Diagnoses   Final diagnoses:  Allergic reaction, initial encounter  Insect bite of left ankle, initial encounter     Discharge Instructions      You were given an injection today in urgent care that is a steroid to decrease inflammation, itching, swelling.  Use cool compresses as well.  Follow-up if symptoms persist or worsen.    ED Prescriptions   None    PDMP not reviewed this encounter.   Teodora Medici, [place] 11/14/21 [phone]

## 2021-11-14 NOTE — Discharge Instructions (Signed)
You were given an injection today in urgent care that is a steroid to decrease inflammation, itching, swelling.  Use cool compresses as well.  Follow-up if symptoms persist or worsen.

## 2021-11-14 NOTE — ED Triage Notes (Signed)
Pt c/o possible insect bite that occurred today resulting in appreciable edema to the left ankle and foot with associated pain and itching.

## 2021-11-15 ENCOUNTER — Telehealth: Payer: Self-pay

## 2021-11-15 MED ORDER — CARBAMAZEPINE ER 200 MG PO TB12: 200.0000 mg | ORAL_TABLET | Freq: Every day | ORAL | 3 refills | Status: DC

## 2021-11-15 NOTE — Addendum Note (Signed)
Addended by: Charise Killian on: 11/15/2021 03:03 PM   Modules accepted: Orders

## 2021-11-15 NOTE — Telephone Encounter (Signed)
Returned call to Ms. Liston regarding insect bite over left foot. She noticed a stinging sensation yesterday while outside in the yard and then noticed redness and swelling of her left ankle. She was seen in urgent care last evening and given a dose of IM steroid for localized allergic reaction. She feels this helped some but continues to have itching and burning over the area of the bite. She has only used benadryl cream which has helped some. Encouraged cold pack/ice and elevation, as well as continued topical antihistamine and cetirizine bid until symptoms improve. Symptoms are localized to the bite site at this time. We reviewed return precautions to clinic and/or urgent care if things are not improving.

## 2021-11-15 NOTE — Telephone Encounter (Signed)
Patient called she stated she went to the ED last night for a insect bite patient stated the bite area isn't getting any better and her ankle is swelling she is requesting a call back regarding what she should do.

## 2021-11-20 ENCOUNTER — Ambulatory Visit (INDEPENDENT_AMBULATORY_CARE_PROVIDER_SITE_OTHER): Payer: Medicare HMO

## 2021-11-20 DIAGNOSIS — T63441D Toxic effect of venom of bees, accidental (unintentional), subsequent encounter: Secondary | ICD-10-CM

## 2021-11-21 ENCOUNTER — Ambulatory Visit: Payer: Medicare HMO | Attending: Internal Medicine | Admitting: Rehabilitation

## 2021-11-21 ENCOUNTER — Encounter: Payer: Self-pay | Admitting: Rehabilitation

## 2021-11-21 ENCOUNTER — Telehealth: Payer: Self-pay | Admitting: Rehabilitation

## 2021-11-21 ENCOUNTER — Other Ambulatory Visit: Payer: Self-pay

## 2021-11-21 DIAGNOSIS — M25551 Pain in right hip: Secondary | ICD-10-CM

## 2021-11-21 DIAGNOSIS — R293 Abnormal posture: Secondary | ICD-10-CM | POA: Diagnosis present

## 2021-11-21 DIAGNOSIS — R42 Dizziness and giddiness: Secondary | ICD-10-CM

## 2021-11-21 DIAGNOSIS — M6281 Muscle weakness (generalized): Secondary | ICD-10-CM | POA: Insufficient documentation

## 2021-11-21 DIAGNOSIS — R2689 Other abnormalities of gait and mobility: Secondary | ICD-10-CM

## 2021-11-21 DIAGNOSIS — M5441 Lumbago with sciatica, right side: Secondary | ICD-10-CM | POA: Insufficient documentation

## 2021-11-21 DIAGNOSIS — G5701 Lesion of sciatic nerve, right lower limb: Secondary | ICD-10-CM | POA: Insufficient documentation

## 2021-11-21 NOTE — Telephone Encounter (Signed)
Dr. Saverio Danker,   I evaluated Rebecka Apley here today at OP neuro for piriformis syndrome.  She endorses having dizziness recently and possible vertigo.  I would like to assess/treat this as well.  Could you please re-do referral to include piriformis syndrome and dizziness/unsteadiness??  Thanks so much! Cameron Sprang, PT, MPT [facility] [address] [place], [place], [address] Phone: [phone]   Fax:  [phone] 11/21/21, 3:21 PM

## 2021-11-21 NOTE — Therapy (Signed)
OUTPATIENT PHYSICAL THERAPY THORACOLUMBAR EVALUATION   Patient Name: Andrea Lowery MRN: [id number] DOB:March 16, 1940, 81 y.o., female Today's Date: 11/21/2021   PT End of Session - 11/21/21 1451     Visit Number 1    Number of Visits 17    Date for PT Re-Evaluation 01/20/22    Authorization Type [employer] medicare/medicaid (10th visit PN needed)    Progress Note Due on Visit 10    PT Start Time 1017    PT Stop Time 1100    PT Time Calculation (min) 43 min    Activity Tolerance Patient limited by pain    Behavior During Therapy [facility] for tasks assessed/performed             Past Medical History:  Diagnosis Date   Arthritis    Asthma    Asthma 01/10/2016   Last Assessment & Plan:  After recent ED eval- currrently utilizing prednisone as prescribed. Begin Levaquin, prednisone and Albuterol MDI to be utilized as needed for acute wheezing FU in 2 day is no improvement or worsening of symptoms. Pt verbalizes understanding for current treatment plan. Follow-up in one week. Plan to increase chronic inhaled steroid dose to assess further with prevention of   Diabetes mellitus without complication ([place])    Diabetes II   Difficulty sleeping 12/28/2013   Last Assessment & Plan:  Stable with trazodone nightly. Continue current treatment plan. Refill trazodone today. Tolerating medication well without side effects. Return in 3 months for follow up or sooner if any worsening of symptoms.    DOE (dyspnea on exertion) 07/01/2012   DVT (deep venous thrombosis) ([place])    "Years ago"   Dyslipidemia    Eczema    GERD (gastroesophageal reflux disease)    Headache    Migraine   Hypertension    Hypokalemia 04/30/2016   Hypothyroidism    LBBB (left bundle branch block)    MGUS (monoclonal gammopathy of unknown significance)    MGUS (monoclonal gammopathy of unknown significance)    Migraine    OSA on CPAP    Past Surgical History:  Procedure Laterality Date   ABDOMINAL HYSTERECTOMY  1969    ARTERY BIOPSY N/A 11/07/2014   Procedure: BIOPSY TEMPORAL ARTERY;  Surgeon: Leta Baptist, MD;  Location: [facility];  Service: ENT;  Laterality: N/A;   CARPAL TUNNEL RELEASE Right    CHOLECYSTECTOMY  1976   ENDOVENOUS ABLATION SAPHENOUS VEIN W/ LASER Left 08/24/2020   endovenous laser ablation left greater saphenous vein by Gae Gallop MD   LUMBAR LAMINECTOMY/DECOMPRESSION MICRODISCECTOMY N/A 07/31/2015   Procedure: L4-5 Decompression, Possible Right L4-5 Microdiscectomy;  Surgeon: Marybelle Killings, MD;  Location: [facility];  Service: Orthopedics;  Laterality: N/A;   NM MYOVIEW LTD  02/13/2010   No ischemia   PLANTAR FASCIA RELEASE     TONSILLECTOMY  1965   US ECHOCARDIOGRAPHY  09/20/2008   borderline LVH,mild TR,AOV mildly sclerotic w/ca+ of the leaflets   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   Subacute cough 10/22/2021   Vitamin D insufficiency 10/15/2021   De Quervain's tenosynovitis, left 09/21/2021   Urinary frequency 07/25/2021   Protrusion of lumbar intervertebral disc 09/13/2019   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   Eczema 08/02/2016   Osteoporosis without current pathological fracture 05/30/2015  Chronic right-sided low back pain with right-sided sciatica 03/31/2015   Type 2 diabetes mellitus ([place]) 93/81/8299   Diastolic CHF ([place]) 37/16/9678   Sacroiliac dysfunction 11/04/2014   GERD (gastroesophageal reflux disease) 10/05/2014   Hypothyroidism 09/21/2014   Anxiety and depression 03/18/2014   Healthcare maintenance 01/25/2014   Hyperlipidemia 12/27/2013   Bilateral leg edema 07/20/2013   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/2013   Essential hypertension 07/01/2012    PCP: Charise Killian, MD  REFERRING PROVIDER: Charise Killian, MD   REFERRING DIAG: G57.01  (ICD-10-CM) - Piriformis syndrome of right side   Rationale for Evaluation and Treatment Rehabilitation  THERAPY DIAG:  Pain in right hip  Acute right-sided low back pain with right-sided sciatica  Muscle weakness (generalized)  Abnormal posture  Dizziness and giddiness  Other abnormalities of gait and mobility  ONSET DATE: Approx 2 weeks ago per patient report   SUBJECTIVE:                                                                                                                                                                                           SUBJECTIVE STATEMENT: "When I walk, I feel unsteady.  But sometimes, I do wear high heels so I probably don't need to wear those.  I had two falls recently both on the same day, wearing high heels, I was coming down from singing at church and then going back inside my house, I didn't step high enough.  It hurts right in the back of my R hip/butt and down into my leg.  This has been going on for a couple of weeks."  PERTINENT HISTORY:  Trigeminal neuralgia of left side of face, well-controlled type 2 DM, hyperlipidemia, and hypertension   PAIN:  Are you having pain? Yes: NPRS scale: 8/10 Pain location: R hip/buttocks down to back of thigh Pain description: sharp and shooting  Aggravating factors: sitting Relieving factors: getting up, nothing   PRECAUTIONS: Fall  WEIGHT BEARING RESTRICTIONS: No  FALLS:  Has patient fallen in last 6 months? Yes. Number of falls 2  LIVING ENVIRONMENT: Lives with: lives with their family Lives in: House/apartment Stairs: Yes: External: 1 steps; none Has following equipment at home: None  OCCUPATION: retired, pretty active in church   PLOF: [place]: "I want to be able to wear the shoes I want to wear and walk without any devices."    OBJECTIVE:   DIAGNOSTIC FINDINGS:  In the ED, MRI head was negative for any acute changes and MRI cervical spine was positive for  spondylosis w/ mild-moderate spinal stenosis and foraminal narrowing.  SCREENING FOR RED FLAGS: Bowel or bladder incontinence: No Spinal tumors: No Cauda equina syndrome: No Compression fracture: No Abdominal aneurysm: No  COGNITION:  Overall cognitive status: Within functional limits for tasks assessed     SENSATION: WFL, does have numbness/tingling from R buttocks into leg and sometimes foot.   MUSCLE LENGTH: Normal on both sides, but SLR on R side reproduces symtoms  POSTURE: rounded shoulders and forward head  PALPATION: Pt unable to tolerate any PA mobs to lumbar spine.  Very tender bilaterally but more so on R side.  Pt very tender to palpation over R SIJ and just inferior to this.  Also tender over piriformis and at lateral R hip joint.    LUMBAR ROM:   AROM eval  Flexion WFL and improves symptoms  Extension Unable to tolerate due to pain  Right lateral flexion No difficulty  Left lateral flexion No difficulty  Right rotation WFL  Left rotation Increases symptoms   (Blank rows = not tested)  LOWER EXTREMITY ROM:      Right eval Left eval  Hip flexion Limited due to pain    Hip extension    Hip abduction    Hip adduction    Hip internal rotation    Hip external rotation    Knee flexion    Knee extension    Ankle dorsiflexion    Ankle plantarflexion    Ankle inversion    Ankle eversion     (Blank rows = not tested)  LOWER EXTREMITY MMT:    MMT Right eval Left eval  Hip flexion 2+/5 3+/5  Hip extension    Hip abduction    Hip adduction    Hip internal rotation    Hip external rotation    Knee flexion 3+/5 4/5  Knee extension 4/5 4/5  Ankle dorsiflexion 4/5 4/5  Ankle plantarflexion    Ankle inversion    Ankle eversion     (Blank rows = not tested) All tested in sitting   LUMBAR SPECIAL TESTS:  Straight leg raise test: Positive and repeated motions produced max pain and symptoms in lumbar extension   FUNCTIONAL TESTS:  5 times sit  to stand: 18.19 secs with hands on lap from standard arm chair.   GAIT: Distance walked: 34' (into treatment room from lobby) x 2  Assistive device utilized: None Level of assistance: SBA Comments: Pt with very antalgic gait pattern, R Trendelenburg.  Also note marked swelling in B ankles.  She reports R ankle is normal, but since bug bite, L has also been very swollen.  She does have compression stockings, so PT encouraged her to wear these and if swelling from bug bite doesn't improve over next couple of days to reach out to MD.      TODAY'S TREATMENT:  [facility] Adult PT Treatment:  DATE:  No treatment initiated today    PATIENT EDUCATION:  Education details: Educated on evaluation findings, POC, goals, wanting to assess vestibular deficits and balance at next visit.  Person educated: Patient Education method: Explanation, Demonstration, Tactile cues, and Verbal cues Education comprehension: verbalized understanding, verbal cues required, and needs further education   HOME EXERCISE PROGRAM: None initiated today   ASSESSMENT:  CLINICAL IMPRESSION: Patient is a 81 y.o. female who was seen today for physical therapy evaluation and treatment for R piriformis syndrome.  She also reports dizziness that has continued since a few weeks ago (went to ED).  Note history of well-controlled type 2 DM, hyperlipidemia, and hypertension, and trigeminal neuralgia. Upon PT evaluation, note marked tenderness along entire lumbar spine (more so at L1-3)-note previous back surgery in 2018 however she was unsure what was done, tenderness over SIJ, limited lumbar extension with max reproduction in symptoms, and tenderness/tightness in R piriformis.  Would like to assess vestibular and balance deficits formally at next visit but will get new referral to include this.  Pt will benefit from  skilled OP neuro to address deficits.     OBJECTIVE IMPAIRMENTS: Abnormal gait, decreased activity tolerance, decreased balance, decreased knowledge of use of DME, difficulty walking, decreased ROM, decreased strength, dizziness, hypomobility, impaired perceived functional ability, impaired flexibility, impaired sensation, improper body mechanics, postural dysfunction, and pain.   ACTIVITY LIMITATIONS: carrying, lifting, sitting, stairs, transfers, and locomotion level  PARTICIPATION LIMITATIONS: cleaning, laundry, driving, shopping, and community activity  PERSONAL FACTORS: Age, Social background, Time since onset of injury/illness/exacerbation, and 3+ comorbidities: see above  are also affecting patient's functional outcome.   REHAB POTENTIAL: Good  CLINICAL DECISION MAKING: Evolving/moderate complexity  EVALUATION COMPLEXITY: Moderate   GOALS: Goals reviewed with patient? Yes  SHORT TERM GOALS: Target date: 12/21/2021  Pt will be IND with initial HEP in order to indicate improved functional mobility and dec fall risk. Baseline: Goal status: INITIAL  2.  Will assess FGA and improve score by 3 points from baseline in order to indicate dec fall risk.  Baseline:  Goal status: INITIAL  3.  Pt will report dec in pain from 8/10 to no more than 5/10 with functional activities.  Baseline:  Goal status: INITIAL  4.  Pt will demonstrate improved lumbar/SIJ mobility in order to perform ADLs with less pain.  Baseline:  Goal status: INITIAL  5.  Will do vestibular assessment and update goals as needed.  Baseline:  Goal status: INITIAL    LONG TERM GOALS: Target date: 01/20/2022  Pt will be IND with final HEP in order to indicate improved functional mobility and dec fall risk. Baseline:  Goal status: INITIAL  2.  Pt will report no more than 3/10 pain with functional tasks.  Baseline:  Goal status: INITIAL  3.  Pt will improve FGA by 6 points from baseline in order to  indicate reduced fall risk.  Baseline:  Goal status: INITIAL  4.  Pt will ambulate with gait speed of >/=2.62 ft/sec with less compensations in order to indicate safe community mobility.  Baseline:  Goal status: INITIAL  5.  Pt will ambulate x 500' over unlevel paved surfaces while scanning environment without overt LOB in order to indicate safe community mobility.   Baseline:  Goal status: INITIAL  6.  Pt will be able to cross R LE over LLE in order to indicate improved flexibility and ability to get dressed.  Baseline:  Goal status: INITIAL   PLAN: PT FREQUENCY:  2x/week  PT DURATION: 8 weeks  PLANNED INTERVENTIONS: Therapeutic exercises, Therapeutic activity, Neuromuscular re-education, Balance training, Gait training, Patient/Family education, Self Care, Joint mobilization, Stair training, Vestibular training, Canalith repositioning, DME instructions, Aquatic Therapy, Dry Needling, Spinal mobilization, Moist heat, Taping, Traction, and Manual therapy.  PLAN FOR NEXT SESSION: Do vestibular assessment (may be true BPPV and/or hypofunction-she did report dizziness with bed mobility last time I just didn't have time to assess and I need to get referral updated to include this), assess FGA, gait speed.  Gentle stretching/strengthening and add to HEP, gentle lumbar/SIJ mobs if she will tolerate.    Cameron Sprang, PT, MPT [facility] [address] [place], [place], [address] Phone: [phone]   Fax:  [phone] 11/21/21, 3:17 PM

## 2021-11-27 ENCOUNTER — Encounter: Payer: Self-pay | Admitting: Physical Therapy

## 2021-11-27 ENCOUNTER — Ambulatory Visit: Payer: Medicare HMO | Admitting: Physical Therapy

## 2021-11-27 VITALS — BP 156/69 | HR 60

## 2021-11-27 DIAGNOSIS — R293 Abnormal posture: Secondary | ICD-10-CM

## 2021-11-27 DIAGNOSIS — M5441 Lumbago with sciatica, right side: Secondary | ICD-10-CM

## 2021-11-27 DIAGNOSIS — M25551 Pain in right hip: Secondary | ICD-10-CM

## 2021-11-27 DIAGNOSIS — R42 Dizziness and giddiness: Secondary | ICD-10-CM

## 2021-11-27 DIAGNOSIS — R2689 Other abnormalities of gait and mobility: Secondary | ICD-10-CM

## 2021-11-27 DIAGNOSIS — M6281 Muscle weakness (generalized): Secondary | ICD-10-CM

## 2021-11-27 NOTE — Patient Instructions (Signed)
Access Code: [email or web address] URL: [email or web address] Date: 11/27/2021 Prepared by: Elease Etienne  Exercises - Supine Bridge  - 1 x daily - 4-5 x weekly - 2 sets - 12 reps - Supine Single Knee to Chest  - 1 x daily - 5 x weekly - 1 sets - 3 reps - 30 seconds hold - Supine Figure 4 Piriformis Stretch  - 1 x daily - 5 x weekly - 3 sets - 10 reps

## 2021-11-27 NOTE — Therapy (Signed)
OUTPATIENT PHYSICAL THERAPY THORACOLUMBAR EVALUATION   Patient Name: Andrea Lowery MRN: [id number] DOB:12-Jul-1940, 81 y.o., female Today's Date: 11/27/2021   PT End of Session - 11/27/21 1154     Visit Number 2    Number of Visits 17    Date for PT Re-Evaluation 01/20/22    Authorization Type [employer] medicare/medicaid (10th visit PN needed)    Progress Note Due on Visit 10    PT Start Time 1146    PT Stop Time 1232    PT Time Calculation (min) 46 min    Activity Tolerance Patient tolerated treatment well    Behavior During Therapy [facility] for tasks assessed/performed             Past Medical History:  Diagnosis Date   Arthritis    Asthma    Asthma 01/10/2016   Last Assessment & Plan:  After recent ED eval- currrently utilizing prednisone as prescribed. Begin Levaquin, prednisone and Albuterol MDI to be utilized as needed for acute wheezing FU in 2 day is no improvement or worsening of symptoms. Pt verbalizes understanding for current treatment plan. Follow-up in one week. Plan to increase chronic inhaled steroid dose to assess further with prevention of   Diabetes mellitus without complication ([place])    Diabetes II   Difficulty sleeping 12/28/2013   Last Assessment & Plan:  Stable with trazodone nightly. Continue current treatment plan. Refill trazodone today. Tolerating medication well without side effects. Return in 3 months for follow up or sooner if any worsening of symptoms.    DOE (dyspnea on exertion) 07/01/2012   DVT (deep venous thrombosis) ([place])    "Years ago"   Dyslipidemia    Eczema    GERD (gastroesophageal reflux disease)    Headache    Migraine   Hypertension    Hypokalemia 04/30/2016   Hypothyroidism    LBBB (left bundle branch block)    MGUS (monoclonal gammopathy of unknown significance)    MGUS (monoclonal gammopathy of unknown significance)    Migraine    OSA on CPAP    Past Surgical History:  Procedure Laterality Date   ABDOMINAL HYSTERECTOMY   1969   ARTERY BIOPSY N/A 11/07/2014   Procedure: BIOPSY TEMPORAL ARTERY;  Surgeon: Leta Baptist, MD;  Location: [facility];  Service: ENT;  Laterality: N/A;   CARPAL TUNNEL RELEASE Right    CHOLECYSTECTOMY  1976   ENDOVENOUS ABLATION SAPHENOUS VEIN W/ LASER Left 08/24/2020   endovenous laser ablation left greater saphenous vein by Gae Gallop MD   LUMBAR LAMINECTOMY/DECOMPRESSION MICRODISCECTOMY N/A 07/31/2015   Procedure: L4-5 Decompression, Possible Right L4-5 Microdiscectomy;  Surgeon: Marybelle Killings, MD;  Location: [facility];  Service: Orthopedics;  Laterality: N/A;   NM MYOVIEW LTD  02/13/2010   No ischemia   PLANTAR FASCIA RELEASE     TONSILLECTOMY  1965   US ECHOCARDIOGRAPHY  09/20/2008   borderline LVH,mild TR,AOV mildly sclerotic w/ca+ of the leaflets   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   Subacute cough 10/22/2021   Vitamin D insufficiency 10/15/2021   De Quervain's tenosynovitis, left 09/21/2021   Urinary frequency 07/25/2021   Protrusion of lumbar intervertebral disc 09/13/2019   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   Eczema 08/02/2016   Osteoporosis without current pathological fracture 05/30/2015  Chronic right-sided low back pain with right-sided sciatica 03/31/2015   Type 2 diabetes mellitus ([place]) 22/29/7989   Diastolic CHF ([place]) 21/19/4174   Sacroiliac dysfunction 11/04/2014   GERD (gastroesophageal reflux disease) 10/05/2014   Hypothyroidism 09/21/2014   Anxiety and depression 03/18/2014   Healthcare maintenance 01/25/2014   Hyperlipidemia 12/27/2013   Bilateral leg edema 07/20/2013   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/2013   Essential hypertension 07/01/2012    PCP: Charise Killian, MD  REFERRING PROVIDER: Charise Killian, MD   REFERRING  DIAG: G57.01 (ICD-10-CM) - Piriformis syndrome of right side   Rationale for Evaluation and Treatment Rehabilitation  THERAPY DIAG:  Dizziness and giddiness  Pain in right hip  Acute right-sided low back pain with right-sided sciatica  Muscle weakness (generalized)  Abnormal posture  Other abnormalities of gait and mobility  ONSET DATE: Approx 2 weeks ago per patient report   SUBJECTIVE:                                                                                                                                                                                           SUBJECTIVE STATEMENT: "I got real dizzy yesterday, but I might have eaten something with some salt in it that I didn't realize."  She is wearing compression stockings due to ankle edema.  She took her a left over Meclizine late last night/early this morning.  She did not take her BP meds today.  She denies falls or other acute changes.  States some stumbling at various points throughout the day, unsure of conditions that contribute to her stumbling.  PERTINENT HISTORY:  Trigeminal neuralgia of left side of face, well-controlled type 2 DM, hyperlipidemia, and hypertension   PAIN:  Are you having pain? Yes: NPRS scale: 7/10 Pain location: R hip/buttocks down to back of thigh Pain description: sharp and shooting  Aggravating factors: sitting Relieving factors: getting up, nothing   PRECAUTIONS: Fall  WEIGHT BEARING RESTRICTIONS: No  FALLS:  Has patient fallen in last 6 months? Yes. Number of falls 2  LIVING ENVIRONMENT: Lives with: lives with their family Lives in: House/apartment Stairs: Yes: External: 1 steps; none Has following equipment at home: None  OCCUPATION: retired, pretty active in church   PLOF: [place]: "I want to be able to wear the shoes I want to wear and walk without any devices."    OBJECTIVE:  TODAY'S TREATMENT:  [facility] PT Treatment:  DATE:  -Explained new referral obtained for BPPV assessment, deferring until next session w/ instructions to not take Meclizine prior to appt. -Assessed FGA:  [facility] PT Assessment - 11/27/21 1203       Functional Gait  Assessment   Gait assessed  Yes    Gait Level Surface Walks 20 ft in less than 7 sec but greater than 5.5 sec, uses assistive device, slower speed, mild gait deviations, or deviates 6-10 in outside of the 12 in walkway width.    Change in Gait Speed Able to change speed, demonstrates mild gait deviations, deviates 6-10 in outside of the 12 in walkway width, or no gait deviations, unable to achieve a major change in velocity, or uses a change in velocity, or uses an assistive device.    Gait with Horizontal Head Turns Performs head turns with moderate changes in gait velocity, slows down, deviates 10-15 in outside 12 in walkway width but recovers, can continue to walk.    Gait with Vertical Head Turns Performs task with moderate change in gait velocity, slows down, deviates 10-15 in outside 12 in walkway width but recovers, can continue to walk.    Gait and Pivot Turn Pivot turns safely in greater than 3 sec and stops with no loss of balance, or pivot turns safely within 3 sec and stops with mild imbalance, requires small steps to catch balance.    Step Over Obstacle Is able to step over one shoe box (4.5 in total height) but must slow down and adjust steps to clear box safely. May require verbal cueing.    Gait with Narrow Base of Support Ambulates less than 4 steps heel to toe or cannot perform without assistance.    Gait with Eyes Closed Walks 20 ft, slow speed, abnormal gait pattern, evidence for imbalance, deviates 10-15 in outside 12 in walkway width. Requires more than 9 sec to ambulate 20 ft.    Ambulating Backwards Walks 20 ft, uses assistive device, slower speed, mild gait  deviations, deviates 6-10 in outside 12 in walkway width.    Steps Two feet to a stair, must use rail.    Total Score 13    FGA comment: High fall risk            -10MWT w/o AD:  14.25 sec = 0.70 m/sec OR 2.32 ft/sec  Initiated HEP: -Supine bridges 2x12 within small ROM to prevent inc symptoms and encourage extension motion in low back -Supine single knee to chest stretch 3x30sec RLE only, edu on breathing related to pain as pt tends to hold breath, edu on lowering the leg into hooklying slowly to prevent strain on hip and low back -Supine piriformis stretch 2x30 sec RLE only, modified to lowered LLE to lessen stretch and promote comfort with position, encouraged pt to deepen stretch at home as position becomes more tolerable.  PATIENT EDUCATION:  Education details: Assessment findings and initial HEP.  Edu on ease of transferring supine to sit without stress on low back and hip. Person educated: Patient Education method: Explanation, Demonstration, Tactile cues, and Verbal cues Education comprehension: verbalized understanding, verbal cues required, and needs further education   HOME EXERCISE PROGRAM: Access Code: [email or web address] URL: [email or web address] Date: 11/27/2021 Prepared by: Elease Etienne  Exercises - Supine Bridge  - 1 x daily - 4-5 x weekly - 2 sets - 12 reps - Supine Single Knee to Chest  - 1 x daily - 5 x weekly - 1 sets - 3 reps - 30 seconds  hold - Supine Figure 4 Piriformis Stretch  - 1 x daily - 5 x weekly - 3 sets - 10 reps  ASSESSMENT:  CLINICAL IMPRESSION: Assessed FGA this session with pt scoring 13/30 indicating high fall risk.  She ambulates with a speed of 2.32 ft/sec further indicating fall risk at limited community ambulator status.  Initiated HEP for hip strengthening and stretching.  Will assess BPPV next session and continue to address deficits as noted in ongoing skilled PT POC.   OBJECTIVE IMPAIRMENTS: Abnormal gait, decreased  activity tolerance, decreased balance, decreased knowledge of use of DME, difficulty walking, decreased ROM, decreased strength, dizziness, hypomobility, impaired perceived functional ability, impaired flexibility, impaired sensation, improper body mechanics, postural dysfunction, and pain.   ACTIVITY LIMITATIONS: carrying, lifting, sitting, stairs, transfers, and locomotion level  PARTICIPATION LIMITATIONS: cleaning, laundry, driving, shopping, and community activity  PERSONAL FACTORS: Age, Social background, Time since onset of injury/illness/exacerbation, and 3+ comorbidities: see above  are also affecting patient's functional outcome.   REHAB POTENTIAL: Good  CLINICAL DECISION MAKING: Evolving/moderate complexity  EVALUATION COMPLEXITY: Moderate   GOALS: Goals reviewed with patient? Yes  SHORT TERM GOALS: Target date: 12/21/2021  Pt will be IND with initial HEP in order to indicate improved functional mobility and dec fall risk. Baseline: Goal status: INITIAL  2.  Will assess FGA and improve score by 3 points from baseline in order to indicate dec fall risk.  Baseline: 13/30 Goal status: INITIAL  3.  Pt will report dec in pain from 8/10 to no more than 5/10 with functional activities.  Baseline:  Goal status: INITIAL  4.  Pt will demonstrate improved lumbar/SIJ mobility in order to perform ADLs with less pain.  Baseline:  Goal status: INITIAL  5.  Will do vestibular assessment and update goals as needed.  Baseline:  Goal status: INITIAL    LONG TERM GOALS: Target date: 01/20/2022  Pt will be IND with final HEP in order to indicate improved functional mobility and dec fall risk. Baseline:  Goal status: INITIAL  2.  Pt will report no more than 3/10 pain with functional tasks.  Baseline:  Goal status: INITIAL  3.  Pt will improve FGA by 6 points from baseline in order to indicate reduced fall risk.  Baseline: 13/30 Goal status: INITIAL  4.  Pt will ambulate  with gait speed of >/=2.62 ft/sec with less compensations in order to indicate safe community mobility.  Baseline: 2.32 ft/sec no AD Goal status: INITIAL  5.  Pt will ambulate x 500' over unlevel paved surfaces while scanning environment without overt LOB in order to indicate safe community mobility.   Baseline:  Goal status: INITIAL  6.  Pt will be able to cross R LE over LLE in order to indicate improved flexibility and ability to get dressed.  Baseline:  Goal status: INITIAL   PLAN: PT FREQUENCY: 2x/week  PT DURATION: 8 weeks  PLANNED INTERVENTIONS: Therapeutic exercises, Therapeutic activity, Neuromuscular re-education, Balance training, Gait training, Patient/Family education, Self Care, Joint mobilization, Stair training, Vestibular training, Canalith repositioning, DME instructions, Aquatic Therapy, Dry Needling, Spinal mobilization, Moist heat, Taping, Traction, and Manual therapy.  PLAN FOR NEXT SESSION: Do vestibular assessment (may be true BPPV and/or hypofunction-she did report dizziness with bed mobility last time I just didn't have time to assess and I need to get referral updated to include this).  Gentle stretching/strengthening and add to HEP, gentle lumbar/SIJ mobs if she will tolerate.    Elease Etienne, PT, DPT [place]  [facility] [address] [place], [place], [address] Phone: [phone]   Fax:  [phone] 11/27/21, 1:06 PM

## 2021-11-29 ENCOUNTER — Ambulatory Visit: Payer: Medicare HMO | Admitting: Physical Therapy

## 2021-11-29 ENCOUNTER — Encounter: Payer: Self-pay | Admitting: Physical Therapy

## 2021-11-29 VITALS — BP 137/61 | HR 78

## 2021-11-29 DIAGNOSIS — M25551 Pain in right hip: Secondary | ICD-10-CM

## 2021-11-29 DIAGNOSIS — R42 Dizziness and giddiness: Secondary | ICD-10-CM

## 2021-11-29 DIAGNOSIS — M5441 Lumbago with sciatica, right side: Secondary | ICD-10-CM

## 2021-11-29 DIAGNOSIS — R293 Abnormal posture: Secondary | ICD-10-CM

## 2021-11-29 DIAGNOSIS — M6281 Muscle weakness (generalized): Secondary | ICD-10-CM

## 2021-11-29 DIAGNOSIS — R2689 Other abnormalities of gait and mobility: Secondary | ICD-10-CM

## 2021-11-29 NOTE — Therapy (Signed)
OUTPATIENT PHYSICAL THERAPY THORACOLUMBAR EVALUATION   Patient Name: Andrea Lowery MRN: [id number] DOB:09-24-1940, 81 y.o., female Today's Date: 11/29/2021   PT End of Session - 11/29/21 1455     Visit Number 3    Number of Visits 17    Date for PT Re-Evaluation 01/20/22    Authorization Type [employer] medicare/medicaid (10th visit PN needed)    Progress Note Due on Visit 10    PT Start Time 1452   PT assisting prior pt returning from restroom; pt requesting to go to the restroom at onset of session, 6 minutes not billed.   PT Stop Time 1526    PT Time Calculation (min) 34 min    Activity Tolerance Patient tolerated treatment well    Behavior During Therapy WFL for tasks assessed/performed             Past Medical History:  Diagnosis Date   Arthritis    Asthma    Asthma 01/10/2016   Last Assessment & Plan:  After recent ED eval- currrently utilizing prednisone as prescribed. Begin Levaquin, prednisone and Albuterol MDI to be utilized as needed for acute wheezing FU in 2 day is no improvement or worsening of symptoms. Pt verbalizes understanding for current treatment plan. Follow-up in one week. Plan to increase chronic inhaled steroid dose to assess further with prevention of   Diabetes mellitus without complication ([place])    Diabetes II   Difficulty sleeping 12/28/2013   Last Assessment & Plan:  Stable with trazodone nightly. Continue current treatment plan. Refill trazodone today. Tolerating medication well without side effects. Return in 3 months for follow up or sooner if any worsening of symptoms.    DOE (dyspnea on exertion) 07/01/2012   DVT (deep venous thrombosis) ([place])    "Years ago"   Dyslipidemia    Eczema    GERD (gastroesophageal reflux disease)    Headache    Migraine   Hypertension    Hypokalemia 04/30/2016   Hypothyroidism    LBBB (left bundle branch block)    MGUS (monoclonal gammopathy of unknown significance)    MGUS (monoclonal gammopathy of unknown  significance)    Migraine    OSA on CPAP    Past Surgical History:  Procedure Laterality Date   ABDOMINAL HYSTERECTOMY  1969   ARTERY BIOPSY N/A 11/07/2014   Procedure: BIOPSY TEMPORAL ARTERY;  Surgeon: Leta Baptist, MD;  Location: [facility];  Service: ENT;  Laterality: N/A;   CARPAL TUNNEL RELEASE Right    CHOLECYSTECTOMY  1976   ENDOVENOUS ABLATION SAPHENOUS VEIN W/ LASER Left 08/24/2020   endovenous laser ablation left greater saphenous vein by Gae Gallop MD   LUMBAR LAMINECTOMY/DECOMPRESSION MICRODISCECTOMY N/A 07/31/2015   Procedure: L4-5 Decompression, Possible Right L4-5 Microdiscectomy;  Surgeon: Marybelle Killings, MD;  Location: [facility];  Service: Orthopedics;  Laterality: N/A;   NM MYOVIEW LTD  02/13/2010   No ischemia   PLANTAR FASCIA RELEASE     TONSILLECTOMY  1965   US ECHOCARDIOGRAPHY  09/20/2008   borderline LVH,mild TR,AOV mildly sclerotic w/ca+ of the leaflets   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   Subacute cough 10/22/2021   Vitamin D insufficiency 10/15/2021   De Quervain's tenosynovitis, left 09/21/2021   Urinary frequency 07/25/2021   Protrusion  of lumbar intervertebral disc 09/13/2019   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   Eczema 08/02/2016   Osteoporosis without current pathological fracture 05/30/2015   Chronic right-sided low back pain with right-sided sciatica 03/31/2015   Type 2 diabetes mellitus ([place]) 69/62/9528   Diastolic CHF ([place]) 41/32/4401   Sacroiliac dysfunction 11/04/2014   GERD (gastroesophageal reflux disease) 10/05/2014   Hypothyroidism 09/21/2014   Anxiety and depression 03/18/2014   Healthcare maintenance 01/25/2014   Hyperlipidemia 12/27/2013   Bilateral leg edema 07/20/2013   MGUS (monoclonal gammopathy of unknown significance)  05/20/2013   Essential hypertension 07/01/2012    PCP: Andrea Killian, MD  REFERRING PROVIDER: Charise Killian, MD   REFERRING DIAG: G57.01 (ICD-10-CM) - Piriformis syndrome of right side  R42 (ICD-10-CM) - Dizziness and giddiness  Rationale for Evaluation and Treatment Rehabilitation  THERAPY DIAG:  Dizziness and giddiness  Pain in right hip  Acute right-sided low back pain with right-sided sciatica  Muscle weakness (generalized)  Abnormal posture  Other abnormalities of gait and mobility  ONSET DATE: Approx 2 weeks ago per patient report   SUBJECTIVE:                                                                                                                                                                                           SUBJECTIVE STATEMENT: Pt denies taking any Meclizine today.  She states she mostly has dizziness when bending over or getting out of bed.  She denies falls since visit prior.  PERTINENT HISTORY:  Trigeminal neuralgia of left side of face, well-controlled type 2 DM, hyperlipidemia, and hypertension   PAIN:  Are you having pain? Yes: NPRS scale: 6/10 Pain location: R hip/buttocks down to back of thigh Pain description: sharp and shooting  Aggravating factors: sitting Relieving factors: getting up, nothing   PRECAUTIONS: Fall  WEIGHT BEARING RESTRICTIONS: No  FALLS:  Has patient fallen in last 6 months? Yes. Number of falls 2  LIVING ENVIRONMENT: Lives with: lives with their family Lives in: House/apartment Stairs: Yes: External: 1 steps; none Has following equipment at home: None  OCCUPATION: retired, pretty active in church   PLOF: [place]: "I want to be able to wear the shoes I want to wear and walk without any devices."    OBJECTIVE:  TODAY'S TREATMENT:  [facility] PT Treatment:  DATE:   Assessed orthostatics: Today's Vitals   11/29/21 1505 11/29/21 1506 11/29/21 1509  BP: (!) 143/68 (!) 149/65 137/61  Pulse: 74 72 78  Dizziness returning to sitting from supine.  No notable nystagmus.  Nystagmus absent at rest.  No dizziness coming into standing.  [employer] produced mild dizziness on the right, no nystagmus and severe dizziness of >1.5 minutes on the left w/o notable nystagmus.  Performed Epley x1; Pt is dizzy upon return to sitting >5 minutes pt requesting to hold off on exercises until next time due to prolonged nausea in sitting.  PT monitors pt until dizziness and nausea subside.  PATIENT EDUCATION:  Education details: Discussed vestibular testing and findings.  Bringing an addition person to drive next visit in case further treatment makes her respond similarly to today. Person educated: Patient Education method: Explanation, Demonstration, Tactile cues, and Verbal cues Education comprehension: verbalized understanding, verbal cues required, and needs further education   HOME EXERCISE PROGRAM: Access Code: [email or web address] URL: [email or web address] Date: 11/27/2021 Prepared by: Elease Etienne  Exercises - Supine Bridge  - 1 x daily - 4-5 x weekly - 2 sets - 12 reps - Supine Single Knee to Chest  - 1 x daily - 5 x weekly - 1 sets - 3 reps - 30 seconds hold - Supine Figure 4 Piriformis Stretch  - 1 x daily - 5 x weekly - 3 sets - 10 reps  ASSESSMENT:  CLINICAL IMPRESSION: Assessed orthostatics this session with pt not meeting criteria, but exhibiting a mild systolic BP drop into standing without dizziness between transition from sit to stand.  Assessed Marye Round this session with findings and symptomatology consistent with more motion sensitivity and left vestibular hypofunction rather than BPPV.  Unable to attempt habituation or VOR testing and exercises this session due to prolonged nausea in response to testing.  Pt stating she felt similar to  her baseline upon exiting clinic.  Will continue with POC.   OBJECTIVE IMPAIRMENTS: Abnormal gait, decreased activity tolerance, decreased balance, decreased knowledge of use of DME, difficulty walking, decreased ROM, decreased strength, dizziness, hypomobility, impaired perceived functional ability, impaired flexibility, impaired sensation, improper body mechanics, postural dysfunction, and pain.   ACTIVITY LIMITATIONS: carrying, lifting, sitting, stairs, transfers, and locomotion level  PARTICIPATION LIMITATIONS: cleaning, laundry, driving, shopping, and community activity  PERSONAL FACTORS: Age, Social background, Time since onset of injury/illness/exacerbation, and 3+ comorbidities: see above  are also affecting patient's functional outcome.   REHAB POTENTIAL: Good  CLINICAL DECISION MAKING: Evolving/moderate complexity  EVALUATION COMPLEXITY: Moderate   GOALS: Goals reviewed with patient? Yes  SHORT TERM GOALS: Target date: 12/21/2021  Pt will be IND with initial HEP in order to indicate improved functional mobility and dec fall risk. Baseline: Goal status: INITIAL  2.  Will assess FGA and improve score by 3 points from baseline in order to indicate dec fall risk.  Baseline: 13/30 Goal status: INITIAL  3.  Pt will report dec in pain from 8/10 to no more than 5/10 with functional activities.  Baseline:  Goal status: INITIAL  4.  Pt will demonstrate improved lumbar/SIJ mobility in order to perform ADLs with less pain.  Baseline:  Goal status: INITIAL  5.  Will do vestibular assessment and update goals as needed.  Baseline:  Goal status: INITIAL    LONG TERM GOALS: Target date: 01/20/2022  Pt will be IND with final HEP in order to indicate improved functional mobility and dec fall risk. Baseline:  Goal status: INITIAL  2.  Pt will report no more than 3/10 pain with functional tasks.  Baseline:  Goal status: INITIAL  3.  Pt will improve FGA by 6 points from  baseline in order to indicate reduced fall risk.  Baseline: 13/30 Goal status: INITIAL  4.  Pt will ambulate with gait speed of >/=2.62 ft/sec with less compensations in order to indicate safe community mobility.  Baseline: 2.32 ft/sec no AD Goal status: INITIAL  5.  Pt will ambulate x 500' over unlevel paved surfaces while scanning environment without overt LOB in order to indicate safe community mobility.   Baseline:  Goal status: INITIAL  6.  Pt will be able to cross R LE over LLE in order to indicate improved flexibility and ability to get dressed.  Baseline:  Goal status: INITIAL   PLAN: PT FREQUENCY: 2x/week  PT DURATION: 8 weeks  PLANNED INTERVENTIONS: Therapeutic exercises, Therapeutic activity, Neuromuscular re-education, Balance training, Gait training, Patient/Family education, Self Care, Joint mobilization, Stair training, Vestibular training, Canalith repositioning, DME instructions, Aquatic Therapy, Dry Needling, Spinal mobilization, Moist heat, Taping, Traction, and Manual therapy.  PLAN FOR NEXT SESSION: Do vestibular assessment -DVA-set goal if abnormal, Nestor Lewandowsky, VOR exercises.  Gentle stretching/strengthening and add to HEP, gentle lumbar/SIJ mobs if she will tolerate.    Elease Etienne, PT, [place] [address] [place], [place], [address] Phone: [phone]   Fax:  [phone] 11/29/21, 3:31 PM

## 2021-12-04 ENCOUNTER — Encounter: Payer: Self-pay | Admitting: Physical Therapy

## 2021-12-04 ENCOUNTER — Ambulatory Visit: Payer: Medicare HMO | Admitting: Physical Therapy

## 2021-12-04 DIAGNOSIS — M5441 Lumbago with sciatica, right side: Secondary | ICD-10-CM

## 2021-12-04 DIAGNOSIS — R293 Abnormal posture: Secondary | ICD-10-CM

## 2021-12-04 DIAGNOSIS — R42 Dizziness and giddiness: Secondary | ICD-10-CM

## 2021-12-04 DIAGNOSIS — M25551 Pain in right hip: Secondary | ICD-10-CM

## 2021-12-04 DIAGNOSIS — M6281 Muscle weakness (generalized): Secondary | ICD-10-CM

## 2021-12-04 DIAGNOSIS — R2689 Other abnormalities of gait and mobility: Secondary | ICD-10-CM

## 2021-12-04 NOTE — Therapy (Signed)
OUTPATIENT PHYSICAL THERAPY THORACOLUMBAR TREATMENT   Patient Name: Andrea Lowery MRN: [id number] DOB:09/18/1940, 81 y.o., female Today's Date: 12/04/2021   PT End of Session - 12/04/21 1142     Visit Number 4    Number of Visits 17    Date for PT Re-Evaluation 01/20/22    Authorization Type [employer] medicare/medicaid (10th visit PN needed)    Progress Note Due on Visit 10    PT Start Time 1140   pt agreeable to being seen early   PT Stop Time 1225    PT Time Calculation (min) 45 min    Activity Tolerance Patient tolerated treatment well;Other (comment)   nausea   Behavior During Therapy [facility] for tasks assessed/performed             Past Medical History:  Diagnosis Date   Arthritis    Asthma    Asthma 01/10/2016   Last Assessment & Plan:  After recent ED eval- currrently utilizing prednisone as prescribed. Begin Levaquin, prednisone and Albuterol MDI to be utilized as needed for acute wheezing FU in 2 day is no improvement or worsening of symptoms. Pt verbalizes understanding for current treatment plan. Follow-up in one week. Plan to increase chronic inhaled steroid dose to assess further with prevention of   Diabetes mellitus without complication ([place])    Diabetes II   Difficulty sleeping 12/28/2013   Last Assessment & Plan:  Stable with trazodone nightly. Continue current treatment plan. Refill trazodone today. Tolerating medication well without side effects. Return in 3 months for follow up or sooner if any worsening of symptoms.    DOE (dyspnea on exertion) 07/01/2012   DVT (deep venous thrombosis) ([place])    "Years ago"   Dyslipidemia    Eczema    GERD (gastroesophageal reflux disease)    Headache    Migraine   Hypertension    Hypokalemia 04/30/2016   Hypothyroidism    LBBB (left bundle branch block)    MGUS (monoclonal gammopathy of unknown significance)    MGUS (monoclonal gammopathy of unknown significance)    Migraine    OSA on CPAP    Past Surgical  History:  Procedure Laterality Date   ABDOMINAL HYSTERECTOMY  1969   ARTERY BIOPSY N/A 11/07/2014   Procedure: BIOPSY TEMPORAL ARTERY;  Surgeon: Leta Baptist, MD;  Location: [facility];  Service: ENT;  Laterality: N/A;   CARPAL TUNNEL RELEASE Right    CHOLECYSTECTOMY  1976   ENDOVENOUS ABLATION SAPHENOUS VEIN W/ LASER Left 08/24/2020   endovenous laser ablation left greater saphenous vein by Gae Gallop MD   LUMBAR LAMINECTOMY/DECOMPRESSION MICRODISCECTOMY N/A 07/31/2015   Procedure: L4-5 Decompression, Possible Right L4-5 Microdiscectomy;  Surgeon: Marybelle Killings, MD;  Location: [facility];  Service: Orthopedics;  Laterality: N/A;   NM MYOVIEW LTD  02/13/2010   No ischemia   PLANTAR FASCIA RELEASE     TONSILLECTOMY  1965   US ECHOCARDIOGRAPHY  09/20/2008   borderline LVH,mild TR,AOV mildly sclerotic w/ca+ of the leaflets   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   Subacute cough 10/22/2021   Vitamin D insufficiency 10/15/2021   De Quervain's tenosynovitis, left 09/21/2021   Urinary frequency 07/25/2021   Protrusion of lumbar intervertebral disc 09/13/2019   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   Eczema  08/02/2016   Osteoporosis without current pathological fracture 05/30/2015   Chronic right-sided low back pain with right-sided sciatica 03/31/2015   Type 2 diabetes mellitus ([place]) 67/61/9509   Diastolic CHF ([place]) 32/67/1245   Sacroiliac dysfunction 11/04/2014   GERD (gastroesophageal reflux disease) 10/05/2014   Hypothyroidism 09/21/2014   Anxiety and depression 03/18/2014   Healthcare maintenance 01/25/2014   Hyperlipidemia 12/27/2013   Bilateral leg edema 07/20/2013   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/2013   Essential hypertension 07/01/2012    PCP: Charise Killian, MD  REFERRING PROVIDER: Charise Killian, MD   REFERRING DIAG: G57.01 (ICD-10-CM) - Piriformis syndrome of right side  R42 (ICD-10-CM) - Dizziness and giddiness  Rationale for Evaluation and Treatment Rehabilitation  THERAPY DIAG:  Dizziness and giddiness  Pain in right hip  Acute right-sided low back pain with right-sided sciatica  Muscle weakness (generalized)  Abnormal posture  Other abnormalities of gait and mobility  ONSET DATE: Approx 2 weeks ago per patient report   SUBJECTIVE:                                                                                                                                                                                           SUBJECTIVE STATEMENT: Pt states she was fine after prior session and nausea eventually completely settled.  Her sister is with her today, but unable to drive her if she needs it.  She denies falls or acute changes.  She has been mildly dizzy intermittently since her last visit on the left side of her head and thinks her sinuses are contributing to this.     PERTINENT HISTORY:  Trigeminal neuralgia of left side of face, well-controlled type 2 DM, hyperlipidemia, and hypertension   PAIN:  Are you having pain? Yes: NPRS scale: 7/10 Pain location: low back-more on the middle and right Pain description: sharp and shooting  Aggravating factors: lifting-she thinks she lifted something too heavy Relieving factors: getting up, nothing   PRECAUTIONS: Fall  WEIGHT BEARING RESTRICTIONS: No  FALLS:  Has patient fallen in last 6 months? Yes. Number of falls 2  LIVING ENVIRONMENT: Lives with: lives with their family Lives in: House/apartment Stairs: Yes: External: 1 steps; none Has following equipment at home: None  OCCUPATION: retired, pretty active in church   PLOF: [place]: "I want to be able to wear the shoes I want to wear and walk without any devices."    OBJECTIVE:  TODAY'S  TREATMENT:  [facility] Adult PT Treatment:  DATE:    DVA assessment:  line 7 is baseline, DVA line 4; edu on finding   Seated vertical and horizontal VOR x1 2x76mn each direction  Side-lying Dix-Hallpike w/ pt more symptomatic on the right today, but continuing to have no nystagmus at rest or with positional testing.  Pt reporting moderate nausea on return to upright following assessment.  Pt requesting to hold off on further side-lying habituation exercises until next session due to mild ongoing nausea.  She further requests to remain upright for duration of session.  -STS x10 w/ BUE support -Standing kickbacks w/ bent knee x12 each side > standing hip abd w/ bent knee x12 each side > standing high march w/o UE support x20 -3 direction lumbar roll outs x15 each -Seated crossbody star reaches x10 each direction -Seated trunk rotations w/ long lever using 3lb wt ball x20 each side  PATIENT EDUCATION:  Education details: Discussed vestibular testing and findings.  Bringing an addition person to drive next visit in case further treatment makes her respond similarly to today. Person educated: Patient Education method: Explanation, Demonstration, Tactile cues, and Verbal cues Education comprehension: verbalized understanding, verbal cues required, and needs further education   HOME EXERCISE PROGRAM: Access Code: [email or web address]: [email or web address] Date: 11/27/2021 Prepared by: MElease Etienne Exercises - Supine Bridge  - 1 x daily - 4-5 x weekly - 2 sets - 12 reps - Supine Single Knee to Chest  - 1 x daily - 5 x weekly - 1 sets - 3 reps - 30 seconds hold - Supine Figure 4 Piriformis Stretch  - 1 x daily - 5 x weekly - 3 sets - 10 reps - Seated Gaze Stabilization with Head Rotation  - 1 x daily - 3 x weekly - 3 sets - 10 reps - Seated Gaze Stabilization with  Head Nod  - 1 x daily - 3 x weekly - 3 sets - 10 reps  ASSESSMENT:  CLINICAL IMPRESSION: Attempted further habituation tasks this session with pt continuing to endorse nausea limiting progression of exercises.  Addressed VOR this session with additions to HEP.  Remainder of session focused on addressing LE strength and low back discomfort in upright positions per pt preference.  Will continue to address deficits outlined in ongoing POC as pt is able to tolerate.   OBJECTIVE IMPAIRMENTS: Abnormal gait, decreased activity tolerance, decreased balance, decreased knowledge of use of DME, difficulty walking, decreased ROM, decreased strength, dizziness, hypomobility, impaired perceived functional ability, impaired flexibility, impaired sensation, improper body mechanics, postural dysfunction, and pain.   ACTIVITY LIMITATIONS: carrying, lifting, sitting, stairs, transfers, and locomotion level  PARTICIPATION LIMITATIONS: cleaning, laundry, driving, shopping, and community activity  PERSONAL FACTORS: Age, Social background, Time since onset of injury/illness/exacerbation, and 3+ comorbidities: see above  are also affecting patient's functional outcome.   REHAB POTENTIAL: Good  CLINICAL DECISION MAKING: Evolving/moderate complexity  EVALUATION COMPLEXITY: Moderate   GOALS: Goals reviewed with patient? Yes  SHORT TERM GOALS: Target date: 12/21/2021  Pt will be IND with initial HEP in order to indicate improved functional mobility and dec fall risk. Baseline: Goal status: INITIAL  2.  Will assess FGA and improve score by 3 points from baseline in order to indicate dec fall risk.  Baseline: 13/30 Goal status: INITIAL  3.  Pt will report dec in pain from 8/10 to no more than 5/10 with functional activities.  Baseline:  Goal status: INITIAL  4.  Pt will demonstrate improved lumbar/SIJ mobility in order to perform ADLs  with less pain.  Baseline:  Goal status: INITIAL  5.  Pt will  improve DVA to within 2 lines of difference in order to demonstrate improved VOR. Baseline: 3 lines difference Goal status: INITIAL    LONG TERM GOALS: Target date: 01/20/2022  Pt will be IND with final HEP in order to indicate improved functional mobility and dec fall risk. Baseline:  Goal status: INITIAL  2.  Pt will report no more than 3/10 pain with functional tasks.  Baseline:  Goal status: INITIAL  3.  Pt will improve FGA by 6 points from baseline in order to indicate reduced fall risk.  Baseline: 13/30 Goal status: INITIAL  4.  Pt will ambulate with gait speed of >/=2.62 ft/sec with less compensations in order to indicate safe community mobility.  Baseline: 2.32 ft/sec no AD Goal status: INITIAL  5.  Pt will ambulate x 500' over unlevel paved surfaces while scanning environment without overt LOB in order to indicate safe community mobility.   Baseline:  Goal status: INITIAL  6.  Pt will be able to cross R LE over LLE in order to indicate improved flexibility and ability to get dressed.  Baseline:  Goal status: INITIAL   PLAN: PT FREQUENCY: 2x/week  PT DURATION: 8 weeks  PLANNED INTERVENTIONS: Therapeutic exercises, Therapeutic activity, Neuromuscular re-education, Balance training, Gait training, Patient/Family education, Self Care, Joint mobilization, Stair training, Vestibular training, Canalith repositioning, DME instructions, Aquatic Therapy, Dry Needling, Spinal mobilization, Moist heat, Taping, Traction, and Manual therapy.  PLAN FOR NEXT SESSION: Nestor Lewandowsky, VOR exercises.  Gentle stretching/strengthening and add to HEP, gentle lumbar/SIJ mobs if she will tolerate.  Core and glut strength.   Elease Etienne, PT, [facility] [address] [place], [place], [address] Phone: [phone]   Fax:  [phone] 12/04/21, 1:06 PM

## 2021-12-04 NOTE — Patient Instructions (Signed)
Access Code: [email or web address] URL: [email or web address] Date: 12/04/2021 Prepared by: Elease Etienne  Exercises - Supine Bridge  - 1 x daily - 4-5 x weekly - 2 sets - 12 reps - Supine Single Knee to Chest  - 1 x daily - 5 x weekly - 1 sets - 3 reps - 30 seconds hold - Supine Figure 4 Piriformis Stretch  - 1 x daily - 5 x weekly - 3 sets - 10 reps - Seated Gaze Stabilization with Head Rotation  - 1 x daily - 3 x weekly - 3 sets - 10 reps - Seated Gaze Stabilization with Head Nod  - 1 x daily - 3 x weekly - 3 sets - 10 reps

## 2021-12-05 ENCOUNTER — Encounter: Payer: Self-pay | Admitting: Student

## 2021-12-05 ENCOUNTER — Ambulatory Visit (INDEPENDENT_AMBULATORY_CARE_PROVIDER_SITE_OTHER): Payer: Medicare HMO

## 2021-12-05 ENCOUNTER — Ambulatory Visit (INDEPENDENT_AMBULATORY_CARE_PROVIDER_SITE_OTHER): Payer: Medicare HMO | Admitting: Student

## 2021-12-05 ENCOUNTER — Other Ambulatory Visit: Payer: Self-pay

## 2021-12-05 VITALS — BP 153/60 | HR 67 | Temp 97.8°F | Ht 66.0 in | Wt 196.0 lb

## 2021-12-05 DIAGNOSIS — Z87891 Personal history of nicotine dependence: Secondary | ICD-10-CM | POA: Diagnosis not present

## 2021-12-05 DIAGNOSIS — Z0000 Encounter for general adult medical examination without abnormal findings: Secondary | ICD-10-CM | POA: Diagnosis not present

## 2021-12-05 DIAGNOSIS — I10 Essential (primary) hypertension: Secondary | ICD-10-CM | POA: Diagnosis not present

## 2021-12-05 DIAGNOSIS — E1169 Type 2 diabetes mellitus with other specified complication: Secondary | ICD-10-CM | POA: Diagnosis not present

## 2021-12-05 MED ORDER — SPIRONOLACTONE 25 MG PO TABS: 50.0000 mg | ORAL_TABLET | Freq: Every day | ORAL | 0 refills | Status: DC

## 2021-12-05 NOTE — Progress Notes (Signed)
Subjective:   Andrea Lowery is a 81 y.o. female who presents for an Initial Medicare Annual Wellness Visit. I connected with  [name] on 12/05/21 by a  im[facility] clinic      Provider Location: Office/Clinic  I discussed the limitations of evaluation and management by telemedicine. The patient expressed understanding and agreed to proceed.   Review of Systems    Deferred to pcp  Cardiac Risk Factors include: advanced age (>37mn, >>66women);hypertension     Objective:    Today's Vitals   12/05/21 1558  PainSc: 6    There is no height or weight on file to calculate BMI.     12/05/2021    4:03 PM 12/05/2021    2:39 PM 11/21/2021   10:24 AM 11/05/2021    8:31 AM 10/22/2021   10:07 AM 09/19/2021    3:29 PM 07/24/2021    2:14 PM  Advanced Directives  Does Patient Have a Medical Advance Directive? No No Yes No No No No  Type of Advance Directive   [place] Chart?   No - copy requested      Would patient like information on creating a medical advance directive? No - Patient declined No - Patient declined  No - Patient declined No - Patient declined No - Patient declined No - Patient declined    Current Medications (verified) Outpatient Encounter Medications as of 12/05/2021  Medication Sig   acetaminophen (TYLENOL) 500 MG tablet Take 2 tablets (1,000 mg total) by mouth every 8 (eight) hours as needed for moderate pain.   albuterol (VENTOLIN HFA) 108 (90 Base) MCG/ACT inhaler Inhale 2 puffs into the lungs every 4 (four) hours as needed for wheezing or shortness of breath.   amLODipine-olmesartan (AZOR) 10-40 MG tablet Take 1 tablet by mouth daily.   Benralizumab (FASENRA PEN) 30 MG/ML SOAJ Inject 1 mL (30 mg total) into the skin every 8 (eight) weeks.   Blood Glucose Monitoring Suppl (TRUE METRIX METER) w/Device KIT Use as instructed   budesonide-formoterol (SYMBICORT) 160-4.5 MCG/ACT inhaler INHALE 2 PUFFS  BY MOUTH INTO THE LUNGS TWICE DAILY RINSE GARGLE AND SPIT AFTER USE   carbamazepine (TEGRETOL-XR) 200 MG 12 hr tablet Take 1 tablet (200 mg total) by mouth daily.   cholecalciferol (VITAMIN D3) 25 MCG (1000 UNIT) tablet Take 1 tablet (1,000 Units total) by mouth daily.   diclofenac Sodium (VOLTAREN) 1 % GEL Apply to small joints of the hands and wrist up to 4 times daily   DULoxetine (CYMBALTA) 30 MG capsule TAKE 2 CAPSULES EVERY DAY   empagliflozin (JARDIANCE) 10 MG TABS tablet Take 1 tablet (10 mg total) by mouth daily.   EPINEPHrine 0.3 mg/0.3 mL IJ SOAJ injection Use as directed for severe allergic reaction   EPINEPHrine 0.3 mg/0.3 mL IJ SOAJ injection Inject 0.3 mg into the muscle as needed for anaphylaxis.   esomeprazole (NEXIUM) 40 MG capsule Take 1 capsule (40 mg total) by mouth 2 (two) times daily.   ezetimibe (ZETIA) 10 MG tablet Take 1 tablet (10 mg total) by mouth daily.   fluticasone (FLONASE) 50 MCG/ACT nasal spray Place 2 sprays into both nostrils daily.   glucose blood (TRUE METRIX BLOOD GLUCOSE TEST) test strip Use as instructed   hydrocortisone cream 1 % Apply to affected area 2 times daily   hydrOXYzine (ATARAX) 25 MG tablet Take 1 tablet (25 mg total) by mouth  at bedtime. Take before bed for anxiety.   ipratropium-albuterol (DUONEB) 0.5-2.5 (3) MG/3ML SOLN Take 3 mLs by nebulization every 6 (six) hours as needed.   levothyroxine (SYNTHROID) 50 MCG tablet TAKE 1 TABLET EVERY DAY (NEED MD APPOINTMENT FOR REFILLS)   metoprolol succinate (TOPROL XL) 50 MG 24 hr tablet Take 1 tablet (50 mg total) by mouth daily. Take with or immediately following a meal.   montelukast (SINGULAIR) 10 MG tablet Take 1 tablet (10 mg total) by mouth at bedtime.   rosuvastatin (CRESTOR) 40 MG tablet TAKE 1 TABLET EVERY DAY (NEED MD APPOINTMENT FOR REFILLS)   spironolactone (ALDACTONE) 25 MG tablet Take 2 tablets (50 mg total) by mouth daily.   triamcinolone cream (KENALOG) 0.1 % Apply 1 application  topically daily.   TRUEplus Lancets 33G MISC USE AS DIRECTED   topiramate (TOPAMAX) 50 MG tablet Take 1 tablet (50 mg total) by mouth daily.   Facility-Administered Encounter Medications as of 12/05/2021  Medication   Benralizumab SOSY 30 mg    Allergies (verified) Bee venom and Penicillins   History: Past Medical History:  Diagnosis Date   Arthritis    Asthma    Asthma 01/10/2016   Last Assessment & Plan:  After recent ED eval- currrently utilizing prednisone as prescribed. Begin Levaquin, prednisone and Albuterol MDI to be utilized as needed for acute wheezing FU in 2 day is no improvement or worsening of symptoms. Pt verbalizes understanding for current treatment plan. Follow-up in one week. Plan to increase chronic inhaled steroid dose to assess further with prevention of   Diabetes mellitus without complication ([place])    Diabetes II   Difficulty sleeping 12/28/2013   Last Assessment & Plan:  Stable with trazodone nightly. Continue current treatment plan. Refill trazodone today. Tolerating medication well without side effects. Return in 3 months for follow up or sooner if any worsening of symptoms.    DOE (dyspnea on exertion) 07/01/2012   DVT (deep venous thrombosis) ([place])    "Years ago"   Dyslipidemia    Eczema    GERD (gastroesophageal reflux disease)    Headache    Migraine   Hypertension    Hypokalemia 04/30/2016   Hypothyroidism    LBBB (left bundle branch block)    MGUS (monoclonal gammopathy of unknown significance)    MGUS (monoclonal gammopathy of unknown significance)    Migraine    OSA on CPAP    Past Surgical History:  Procedure Laterality Date   ABDOMINAL HYSTERECTOMY  1969   ARTERY BIOPSY N/A 11/07/2014   Procedure: BIOPSY TEMPORAL ARTERY;  Surgeon: Leta Baptist, MD;  Location: [facility];  Service: ENT;  Laterality: N/A;   CARPAL TUNNEL RELEASE Right    CHOLECYSTECTOMY  1976   ENDOVENOUS ABLATION SAPHENOUS VEIN W/ LASER Left 08/24/2020   endovenous laser ablation  left greater saphenous vein by Gae Gallop MD   LUMBAR LAMINECTOMY/DECOMPRESSION MICRODISCECTOMY N/A 07/31/2015   Procedure: L4-5 Decompression, Possible Right L4-5 Microdiscectomy;  Surgeon: Marybelle Killings, MD;  Location: [facility];  Service: Orthopedics;  Laterality: N/A;   NM MYOVIEW LTD  02/13/2010   No ischemia   PLANTAR FASCIA RELEASE     TONSILLECTOMY  1965   US ECHOCARDIOGRAPHY  09/20/2008   borderline LVH,mild TR,AOV mildly sclerotic w/ca+ of the leaflets   Family History  Problem Relation Age of Onset   Diabetes Father    Heart failure Father    Hypertension Father    Sleep apnea Father    Thyroid disease  Sister    Hypertension Sister    Social History   Socioeconomic History   Marital status: Divorced    Spouse name: Not on file   Number of children: Not on file   Years of education: Not on file   Highest education level: Not on file  Occupational History   Not on file  Tobacco Use   Smoking status: Former    Types: Cigarettes    Quit date: 02/04/2006    Years since quitting: 15.8   Smokeless tobacco: Never  Vaping Use   Vaping Use: Never used  Substance and Sexual Activity   Alcohol use: No    Alcohol/week: 0.0 standard drinks of alcohol   Drug use: No   Sexual activity: Not on file  Other Topics Concern   Not on file  Social History Narrative   Lives with sister in a one story home.  Has one child.  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     Social Determinants of Health   Financial Resource Strain: Low Risk  (12/05/2021)   Overall Financial Resource Strain (CARDIA)    Difficulty of Paying Living Expenses: Not hard at all  Food Insecurity: Food Insecurity Present (12/05/2021)   Hunger Vital Sign    Worried About Running Out of Food in the Last Year: Never true    Ran Out of Food in the Last Year: Sometimes true  Transportation Needs: No Transportation Needs (12/05/2021)   PRAPARE - Hydrologist (Medical): No    Lack of Transportation  (Non-Medical): No  Physical Activity: Insufficiently Active (12/05/2021)   Exercise Vital Sign    Days of Exercise per Week: 3 days    Minutes of Exercise per Session: 40 min  Stress: No Stress Concern Present (12/05/2021)   [place]    Feeling of Stress : Not at all  Social Connections: Socially Isolated (12/05/2021)   Social Connection and Isolation Panel [NHANES]    Frequency of Communication with Friends and Family: Once a week    Frequency of Social Gatherings with Friends and Family: Once a week    Attends Religious Services: 1 to 4 times per year    Active Member of [employer] or Organizations: No    Attends Music therapist: Never    Marital Status: Divorced    Tobacco Counseling Counseling given: Not Answered   Clinical Intake:  Pre-visit preparation completed: Yes  Pain : 0-10 Pain Score: 6  Pain Type: Chronic pain Pain Location: Hip Pain Descriptors / Indicators: Constant Pain Onset: More than a month ago     BMI - recorded: 31 Nutritional Status: BMI > 30  Obese Nutritional Risks: None Diabetes: Yes  How often do you need to have someone help you when you read instructions, pamphlets, or other written materials from your doctor or pharmacy?: 1 - Never What is the last grade level you completed in school?: college  Diabetic?yes   Interpreter Needed?: No  Information entered by :: Wileen Duncanson   Activities of Daily Living    12/05/2021    4:04 PM 12/05/2021    2:39 PM  In your present state of health, do you have any difficulty performing the following activities:  Hearing? 0 0  Vision? 0 0  Difficulty concentrating or making decisions? 0 0  Walking or climbing stairs? 0 1  Dressing or bathing? 0 0  Doing errands, shopping? 0 0  Preparing Food and eating ? N  Using the Toilet? N   In the past six months, have you accidently leaked urine? N   Do you have problems with  loss of bowel control? N   Managing your Medications? N   Managing your Finances? N   Housekeeping or managing your Housekeeping? N     Patient Care Team: Charise Killian, MD as PCP - General (Internal Medicine) Lavonna Monarch, MD (Inactive) as Consulting Physician (Dermatology)  Indicate any recent Medical Services you may have received from other than [facility] providers in the past year (date may be approximate).     Assessment:   This is a routine wellness examination for Andrea Lowery.  Hearing/Vision screen No results found.  Dietary issues and exercise activities discussed: Current Exercise Habits: Home exercise routine, Frequency (Times/Week): 3, Intensity: Mild   Goals Addressed   None   Depression Screen    12/05/2021    4:02 PM 12/05/2021    2:38 PM 11/05/2021    8:58 AM 10/22/2021   10:07 AM 10/10/2021    4:16 PM 09/19/2021    4:36 PM 07/24/2021    2:26 PM  PHQ 2/9 Scores  PHQ - 2 Score _0 0 _1 PHQ- 9 Score _2 Fall Risk    12/05/2021    4:03 PM 12/05/2021    2:38 PM 11/05/2021    8:27 AM 10/22/2021   10:07 AM 09/19/2021    3:29 PM  Fall Risk   Falls in the past year? _3 0 1  Number falls in past yr: _4 0 0  Injury with Fall? 1 0 0 0 0  Risk for fall due to : No Fall Risks Other (Comment) Impaired balance/gait;History of fall(s) No Fall Risks   Follow up Falls evaluation completed;Falls prevention discussed Falls prevention discussed Falls evaluation completed Falls evaluation completed;Falls prevention discussed Falls evaluation completed    FALL RISK PREVENTION PERTAINING TO THE HOME:  Any stairs in or around the home? Yes  If so, are there any without handrails? No  Home free of loose throw rugs in walkways, pet beds, electrical cords, etc? Yes  Adequate lighting in your home to reduce risk of falls? No   ASSISTIVE DEVICES UTILIZED TO PREVENT FALLS:  Life alert? No  Use of a cane, walker or w/c? No  Grab bars in the bathroom? No  Shower  chair or bench in shower? No  Elevated toilet seat or a handicapped toilet? No   TIMED UP AND GO:  Was the test performed? No .  Length of time to ambulate 10 feet: n/a sec.     Cognitive Function:        12/05/2021    4:05 PM  6CIT Screen  What Year? 0 points  What month? 0 points  What time? 0 points  Count back from 20 0 points  Months in reverse 0 points  Repeat phrase 0 points  Total Score 0 points    Immunizations Immunization History  Administered Date(s) Administered   Influenza Split 11/06/2013   Influenza, High Dose Seasonal PF 11/05/2013, 11/05/2014, 11/17/2015, 10/17/2016, 12/01/2017, 10/03/2018   Influenza, Seasonal, Injecte, Preservative Fre 11/08/2015   Influenza-Unspecified 11/06/2013, 11/08/2015, 11/04/2020   PFIZER(Purple Top)SARS-COV-2 Vaccination 02/23/2019, 03/16/2019   Pfizer Covid-19 Vaccine Bivalent Booster 47yr & up 12/03/2019   Pneumococcal Conjugate-13 01/25/2014   Pneumococcal Polysaccharide-23 11/03/2014   Zoster Recombinat (Shingrix) 10/17/2016, 01/29/2017    TDAP status: Due, Education  has been provided regarding the importance of this vaccine. Advised may receive this vaccine at local pharmacy or Health Dept. Aware to provide a copy of the vaccination record if obtained from local pharmacy or Health Dept. Verbalized acceptance and understanding.  Flu Vaccine status: Due, Education has been provided regarding the importance of this vaccine. Advised may receive this vaccine at local pharmacy or Health Dept. Aware to provide a copy of the vaccination record if obtained from local pharmacy or Health Dept. Verbalized acceptance and understanding.  Pneumococcal vaccine status: Up to date  Covid-19 vaccine status: Completed vaccines  Qualifies for Shingles Vaccine? Yes   Zostavax completed Yes   Shingrix Completed?: Yes  Screening Tests Health Maintenance  Topic Date Due   TETANUS/TDAP  Never done   DEXA SCAN  Never done   Diabetic  kidney evaluation - Urine ACR  08/03/2019   COVID-19 Vaccine (4 - Pfizer risk series) 01/28/2020   OPHTHALMOLOGY EXAM  07/12/2021   INFLUENZA VACCINE  09/04/2021   HEMOGLOBIN A1C  04/10/2022   FOOT EXAM  10/23/2022   MAMMOGRAM  10/23/2022   Diabetic kidney evaluation - GFR measurement  10/25/2022   Medicare Annual Wellness (AWV)  12/06/2022   Pneumonia Vaccine 7+ Years old  Completed   Hepatitis C Screening  Completed   Zoster Vaccines- Shingrix  Completed   HPV VACCINES  Aged Out    Health Maintenance  Health Maintenance Due  Topic Date Due   TETANUS/TDAP  Never done   DEXA SCAN  Never done   Diabetic kidney evaluation - Urine ACR  08/03/2019   COVID-19 Vaccine (4 - Pfizer risk series) 01/28/2020   OPHTHALMOLOGY EXAM  07/12/2021   INFLUENZA VACCINE  09/04/2021     Mammogram status: Completed 10/22/2021. Repeat every 1 year     Lung Cancer Screening: (Low Dose CT Chest recommended if Age 61-80 years, 30 pack-year currently smoking OR have quit w/in 15years.) does not qualify.   Lung Cancer Screening Referral: deferred to pcp   Additional Screening:  Hepatitis C Screening: does qualify; Completed 06/14/2021  Vision Screening: Recommended annual ophthalmology exams for early detection of glaucoma and other disorders of the eye. Is the patient up to date with their annual eye exam?  Yes  Who is the provider or what is the name of the office in which the patient attends annual eye exams?  Pt does not recall dr name  If pt is not established with a provider, would they like to be referred to a provider to establish care? No .   Dental Screening: Recommended annual dental exams for proper oral hygiene  Community Resource Referral / Chronic Care Management: CRR required this visit?  No   CCM required this visit?  No      Plan:     I have personally reviewed and noted the following in the patient's chart:   Medical and social history Use of alcohol, tobacco or  illicit drugs  Current medications and supplements including opioid prescriptions. Patient is not currently taking opioid prescriptions. Functional ability and status Nutritional status Physical activity Advanced directives List of other physicians Hospitalizations, surgeries, and ER visits in previous 12 months Vitals Screenings to include cognitive, depression, and falls Referrals and appointments  In addition, I have reviewed and discussed with patient certain preventive protocols, quality metrics, and best practice recommendations. A written personalized care plan for preventive services as well as general preventive health recommendations were provided to patient.     Seymour Bars  Tamala Julian, [place]   12/05/2021   Nurse Notes: face to face    Andrea Lowery , Thank you for taking time to come for your Medicare Wellness Visit. I appreciate your ongoing commitment to your health goals. Please review the following plan we discussed and let me know if I can assist you in the future.   These are the goals we discussed:  Goals   None     This is a list of the screening recommended for you and due dates:  Health Maintenance  Topic Date Due   Tetanus Vaccine  Never done   DEXA scan (bone density measurement)  Never done   Yearly kidney health urinalysis for diabetes  08/03/2019   COVID-19 Vaccine (4 - Pfizer risk series) 01/28/2020   Eye exam for diabetics  07/12/2021   Flu Shot  09/04/2021   Hemoglobin A1C  04/10/2022   Complete foot exam   10/23/2022   Mammogram  10/23/2022   Yearly kidney function blood test for diabetes  10/25/2022   Medicare Annual Wellness Visit  12/06/2022   Pneumonia Vaccine  Completed   Hepatitis C Screening: USPSTF Recommendation to screen - Ages 18-79 yo.  Completed   Zoster (Shingles) Vaccine  Completed   HPV Vaccine  Aged Out

## 2021-12-05 NOTE — Patient Instructions (Signed)

## 2021-12-05 NOTE — Patient Instructions (Addendum)
[name], it was a pleasure seeing you today!  Today we discussed: - Blood pressure: We are going to increase your spironolactone to '50mg'$  daily. Please return in one month so we can re-check lab work and your blood pressure.   I have ordered the following medication/changed the following medications:   Start the following medications: Meds ordered this encounter  Medications   spironolactone (ALDACTONE) 25 MG tablet    Sig: Take 2 tablets (50 mg total) by mouth daily.    Dispense:  60 tablet    Refill:  0    Follow-up:  1 month    Please make sure to arrive 15 minutes prior to your next appointment. If you arrive late, you may be asked to reschedule.   We look forward to seeing you next time. Please call our clinic at [phone] if you have any questions or concerns. The best time to call is Monday-Friday from 9am-4pm, but there is someone available 24/7. If after hours or the weekend, call the main hospital number and ask for the Internal Medicine Resident On-Call. If you need medication refills, please notify your pharmacy one week in advance and they will send [place] a request.  Thank you for letting us take part in your care. Wishing you the best!  Thank you, Sanjuan Dame, MD

## 2021-12-06 ENCOUNTER — Ambulatory Visit (INDEPENDENT_AMBULATORY_CARE_PROVIDER_SITE_OTHER): Payer: Medicare HMO

## 2021-12-06 DIAGNOSIS — J4550 Severe persistent asthma, uncomplicated: Secondary | ICD-10-CM

## 2021-12-07 ENCOUNTER — Ambulatory Visit: Payer: Medicare HMO | Admitting: Physical Therapy

## 2021-12-07 DIAGNOSIS — R2689 Other abnormalities of gait and mobility: Secondary | ICD-10-CM | POA: Insufficient documentation

## 2021-12-07 DIAGNOSIS — R42 Dizziness and giddiness: Secondary | ICD-10-CM | POA: Insufficient documentation

## 2021-12-07 DIAGNOSIS — R293 Abnormal posture: Secondary | ICD-10-CM

## 2021-12-07 DIAGNOSIS — M25551 Pain in right hip: Secondary | ICD-10-CM

## 2021-12-07 DIAGNOSIS — M5441 Lumbago with sciatica, right side: Secondary | ICD-10-CM

## 2021-12-07 DIAGNOSIS — M6281 Muscle weakness (generalized): Secondary | ICD-10-CM

## 2021-12-07 NOTE — Assessment & Plan Note (Signed)
BP Readings from Last 3 Encounters:  12/05/21 (!) 153/60  11/29/21 137/61  11/27/21 (!) 156/69   Blood pressure today elevated with systolics in 955'O. Andrea Lowery does report she has been compliant with her medications and has taken them this morning. When she does take her BP at home, her systolic pressures typically 150-160. She denies any lightheadedness, dizziness, syncopal episodes, chest pain, dyspnea.  With Andrea Lowery's pressures still significantly elevated I do believe she could benefit from further antihypertensive effects. We will plan to increase her spironolactone and have her return in four weeks to recheck BP and BMP.  - Increase spironolactone '50mg'$  daily - Continue amlodipine '10mg'$ -olmesartan '40mg'$  daily - Metoprolol-XL '50mg'$  daily - Return to clinic in four weeks for BP check, BMP at that time

## 2021-12-07 NOTE — Therapy (Signed)
[facility] [address] [place], [place], [address] Phone: [phone]   Fax:  [phone]  Patient Details - ARRIVED NO CHARGE Name: Andrea Lowery MRN: [id number] Date of Birth: 12-03-1940 Referring Provider:  Charise Killian, MD  Encounter Date: 12/07/2021  Pt states she is too dizzy to do therapy today.  Requesting to hold until next visit.  PT confirms pt's next appt.  Bary Richard, PT, DPT 12/07/2021, 1:36 PM  [facility] [address] [place], [place], [address] Phone: [phone]   Fax:  [phone]

## 2021-12-07 NOTE — Progress Notes (Signed)
   CC: blood pressure follow-up  HPI:  Andrea Lowery is a 81 y.o. person with medical history as below presenting to [facility] for blood pressure follow-up.  Please see problem-based list for further details, assessments, and plans.  Past Medical History:  Diagnosis Date   Arthritis    Asthma    Asthma 01/10/2016   Last Assessment & Plan:  After recent ED eval- currrently utilizing prednisone as prescribed. Begin Levaquin, prednisone and Albuterol MDI to be utilized as needed for acute wheezing FU in 2 day is no improvement or worsening of symptoms. Pt verbalizes understanding for current treatment plan. Follow-up in one week. Plan to increase chronic inhaled steroid dose to assess further with prevention of   Diabetes mellitus without complication ([place])    Diabetes II   Difficulty sleeping 12/28/2013   Last Assessment & Plan:  Stable with trazodone nightly. Continue current treatment plan. Refill trazodone today. Tolerating medication well without side effects. Return in 3 months for follow up or sooner if any worsening of symptoms.    DOE (dyspnea on exertion) 07/01/2012   DVT (deep venous thrombosis) ([place])    "Years ago"   Dyslipidemia    Eczema    GERD (gastroesophageal reflux disease)    Headache    Migraine   Hypertension    Hypokalemia 04/30/2016   Hypothyroidism    LBBB (left bundle branch block)    MGUS (monoclonal gammopathy of unknown significance)    MGUS (monoclonal gammopathy of unknown significance)    Migraine    OSA on CPAP    Review of Systems:  As per HPI  Physical Exam:  Vitals:   12/05/21 1435 12/05/21 1439  BP: (!) 150/56 (!) 153/60  Pulse: 64 67  Temp: 97.8 F (36.6 C)   TempSrc: Oral   SpO2: 99%   Weight: 196 lb (88.9 kg)   Height: '5\' 6"'$  (1.676 m)    General: Resting comfortably in chair, no acute distress CV: Regular rate, rhythm. No murmurs appreciated. Warm extremities. Pulm: Normal work of breathing on room air. Clear to auscultation  bilaterally.  MSK: Normal bulk, tone. No peripheral edema. Skin: Warm, dry. No rashes or lesions. Neuro: Awake, alert, conversing appropriately. Grossly non-focal. Psych: Normal mood, affect, speech.   Assessment & Plan:   Essential hypertension BP Readings from Last 3 Encounters:  12/05/21 (!) 153/60  11/29/21 137/61  11/27/21 (!) 156/69   Blood pressure today elevated with systolics in 161'W. Ms. Jasmer does report she has been compliant with her medications and has taken them this morning. When she does take her BP at home, her systolic pressures typically 150-160. She denies any lightheadedness, dizziness, syncopal episodes, chest pain, dyspnea.  With Ms. Canizalez's pressures still significantly elevated I do believe she could benefit from further antihypertensive effects. We will plan to increase her spironolactone and have her return in four weeks to recheck BP and BMP.  - Increase spironolactone '50mg'$  daily - Continue amlodipine '10mg'$ -olmesartan '40mg'$  daily - Metoprolol-XL '50mg'$  daily - Return to clinic in four weeks for BP check, BMP at that time  Patient discussed with Dr.  Maurilio Lovely, MD Internal Medicine PGY-3 Pager: [phone] c

## 2021-12-11 ENCOUNTER — Encounter: Payer: Self-pay | Admitting: Neurology

## 2021-12-11 ENCOUNTER — Telehealth: Payer: Self-pay | Admitting: Rehabilitation

## 2021-12-11 ENCOUNTER — Ambulatory Visit (INDEPENDENT_AMBULATORY_CARE_PROVIDER_SITE_OTHER): Payer: Medicare HMO | Admitting: Neurology

## 2021-12-11 ENCOUNTER — Ambulatory Visit: Payer: Medicare HMO | Attending: Internal Medicine | Admitting: Rehabilitation

## 2021-12-11 VITALS — BP 162/78 | HR 81 | Ht 66.0 in | Wt 193.0 lb

## 2021-12-11 DIAGNOSIS — R519 Headache, unspecified: Secondary | ICD-10-CM

## 2021-12-11 DIAGNOSIS — G8929 Other chronic pain: Secondary | ICD-10-CM | POA: Diagnosis not present

## 2021-12-11 NOTE — Addendum Note (Signed)
Addended bySanjuan Dame on: 12/11/2021 03:39 PM   Modules accepted: Level of Service

## 2021-12-11 NOTE — Progress Notes (Signed)
Subjective:    Patient ID: Andrea Lowery is a 81 y.o. female.  HPI    Interim history:   Dear Dr. Saverio Danker,    I saw your patient, Andrea Lowery, upon your kind request in my Neurologic clinic today for initial consultation of her left-sided facial pain, previously diagnosed as trigeminal neuralgia.  The patient is unaccompanied today.  As you know, [name] is a 81 year old female with an underlying medical history of asthma, arthritis, diabetes, DVT, eczema, hyperlipidemia, reflux disease, MGUS, migraine headaches, and obesity, who reports chronic left-sided facial pain, it comes and goes, it flares up when she chews something.  She feels improved since she was started on Tegretol-XR 200 mg daily.  She has been able to tolerate it.  I reviewed your office note from 11/05/2021.  I had evaluated her in March 2023 for sleep apnea.  She had a baseline sleep study in April 2023 which did not show any significant obstructive sleep apnea.  She had seen Dr. Tomi Likens at [facility] neurology in 2016 for left-sided facial pain.  She had imaging studies at that time and was treated symptomatically with gabapentin, 300 mg 3 times daily.  She was on prednisone at the time and advised to taper off.  She has had multiple imaging test in the recent past.    She had a brain MRI with and without contrast on 10/24/2021 and I reviewed the results:  IMPRESSION: 1. No acute intracranial abnormality. 2. Mild chronic microvascular ischemic disease for age.   She a cervical spine MRI with and without contrast on 10/24/2021 and I reviewed the results:  IMPRESSION: 1. No acute abnormality within the cervical spine or spinal cord. 2. Multilevel cervical spondylosis with resultant mild to moderate diffuse spinal stenosis at C3-4 through C6-7. 3. Multifactorial degenerative changes with resultant multilevel foraminal narrowing as above. Notable findings include moderate left C4 foraminal narrowing, with severe  right and moderate left C5 and C6 foraminal stenosis.   She had a CT angiogram of the head and neck with and without contrast on 09/10/2020 and I reviewed the results:  IMPRESSION: 1. No emergent large vessel occlusion or high-grade stenosis of the intracranial or cervical arteries. 2. Bilateral carotid bifurcation atherosclerosis without hemodynamically significant stenosis by NASCET criteria.   Aortic Atherosclerosis (ICD10-I70.0).  She had a head CT without contrast on 09/10/2020 for indication of dizziness and headache and I reviewed the results:  IMPRESSION: 1. Stable age related cerebral atrophy, ventriculomegaly and periventricular white matter disease. 2. No acute intracranial findings or mass lesions.   She does not hydrate well with water by self admission, she estimates that she only drinks 1-1/2 cups of water per day.  She likes to drink Pepsi and drinks about 1-1/2 bottles of regular Pepsi per day, 16.9 ounce size.  She admits to falling recently.  She fell about 2 weeks ago but thankfully did not hurt herself.  She was wearing high heels.  She has had right hip pain and low back pain.  She has not seen orthopedics recently but used to see Dr. Lorin Lowery in the past.  She has had significant ankle swelling which she feels has become worse.  Of note, she takes amlodipine.  She is on multiple potentially sedating medications including Cymbalta 60 mg daily, hydroxyzine, and Topamax.  Previously:   04/30/21: 81 year old right-handed woman with an underlying complex medical history of hypertension, hypothyroidism, left bundle branch block, DVT, hyperlipidemia, arthritis, asthma, MGUS, migraines, diabetes, and borderline obesity, who  reports snoring and excessive daytime somnolence.  She was previously diagnosed with obstructive sleep apnea and placed on PAP therapy.  I reviewed your office note from 02/12/2021.  She was previously on CPAP therapy.  I was able to review a baseline sleep  study report from 01/25/2014, at which time her AHI was 0.3/h, O2 nadir 91%. She is on multiple blood pressure medications.  She does not recall taking hydrochlorothiazide however.  She no longer uses a CPAP machine, she reports that she was told she no longer needs it.  She takes care of of her older sister who has dementia.  Patient's sister lives with her.  Patient has 1 son.  She reports stress, her grandson was killed 2 years ago and this has been a big source of stress and grief for her.  Bedtime is generally around 11:30 PM and rise time between 8 and 9 AM.  She has a TV in her bedroom and it tends to stay on most of the night.  Her weight has been more or less stable.  She had a tonsillectomy at age 52 or 21.  She does drink caffeine in the form of regular soda, about 2-3 bottles per day on average.  She does not currently consume any alcohol and quit smoking about 20 years ago.  She occasionally has headaches in the mornings and has nocturia about twice per average night.   Her Past Medical History Is Significant For: Past Medical History:  Diagnosis Date   Arthritis    Asthma    Asthma 01/10/2016   Last Assessment & Plan:  After recent ED eval- currrently utilizing prednisone as prescribed. Begin Levaquin, prednisone and Albuterol MDI to be utilized as needed for acute wheezing FU in 2 day is no improvement or worsening of symptoms. Pt verbalizes understanding for current treatment plan. Follow-up in one week. Plan to increase chronic inhaled steroid dose to assess further with prevention of   Diabetes mellitus without complication ([place])    Diabetes II   Difficulty sleeping 12/28/2013   Last Assessment & Plan:  Stable with trazodone nightly. Continue current treatment plan. Refill trazodone today. Tolerating medication well without side effects. Return in 3 months for follow up or sooner if any worsening of symptoms.    DOE (dyspnea on exertion) 07/01/2012   DVT (deep venous thrombosis)  ([place])    "Years ago"   Dyslipidemia    Eczema    GERD (gastroesophageal reflux disease)    Headache    Migraine   Hypertension    Hypokalemia 04/30/2016   Hypothyroidism    LBBB (left bundle branch block)    MGUS (monoclonal gammopathy of unknown significance)    MGUS (monoclonal gammopathy of unknown significance)    Migraine    OSA on CPAP    Sinus infection    had sinus surgery    Her Past Surgical History Is Significant For: Past Surgical History:  Procedure Laterality Date   ABDOMINAL HYSTERECTOMY  1969   ARTERY BIOPSY N/A 11/07/2014   Procedure: BIOPSY TEMPORAL ARTERY;  Surgeon: Leta Baptist, MD;  Location: [facility];  Service: ENT;  Laterality: N/A;   CARPAL TUNNEL RELEASE Right    CHOLECYSTECTOMY  1976   ENDOVENOUS ABLATION SAPHENOUS VEIN W/ LASER Left 08/24/2020   endovenous laser ablation left greater saphenous vein by Gae Gallop MD   LUMBAR LAMINECTOMY/DECOMPRESSION MICRODISCECTOMY N/A 07/31/2015   Procedure: L4-5 Decompression, Possible Right L4-5 Microdiscectomy;  Surgeon: Marybelle Killings, MD;  Location: [facility];  Service: Orthopedics;  Laterality: N/A;   NASAL SINUS SURGERY     "years ago"   NM MYOVIEW LTD  02/13/2010   No ischemia   PLANTAR FASCIA RELEASE     TONSILLECTOMY  1965   US ECHOCARDIOGRAPHY  09/20/2008   borderline LVH,mild TR,AOV mildly sclerotic w/ca+ of the leaflets    Her Family History Is Significant For: Family History  Problem Relation Age of Onset   Dementia Mother    Gallbladder disease Mother    Diabetes Father    Heart failure Father    Hypertension Father    Sleep apnea Father    Kidney disease Father    Dementia Father    Thyroid disease Sister    Hypertension Sister    Dementia Sister    Healthy Sister     Her Social History Is Significant For: Social History   Socioeconomic History   Marital status: Divorced    Spouse name: Not on file   Number of children: Not on file   Years of education: Not on file   Highest education  level: Not on file  Occupational History   Not on file  Tobacco Use   Smoking status: Former    Types: Cigarettes    Quit date: 02/04/2006    Years since quitting: 15.8   Smokeless tobacco: Never  Vaping Use   Vaping Use: Never used  Substance and Sexual Activity   Alcohol use: No    Alcohol/week: 0.0 standard drinks of alcohol   Drug use: No   Sexual activity: Not on file  Other Topics Concern   Not on file  Social History Narrative   Lives with sister in a one story home.  Has one child.  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     Right handed   Caffeine: 24 oz/day of pepsi   Social Determinants of Health   Financial Resource Strain: Low Risk  (12/05/2021)   Overall Financial Resource Strain (CARDIA)    Difficulty of Paying Living Expenses: Not hard at all  Food Insecurity: Food Insecurity Present (12/05/2021)   Hunger Vital Sign    Worried About Running Out of Food in the Last Year: Never true    Ran Out of Food in the Last Year: Sometimes true  Transportation Needs: No Transportation Needs (12/05/2021)   PRAPARE - Hydrologist (Medical): No    Lack of Transportation (Non-Medical): No  Physical Activity: Insufficiently Active (12/05/2021)   Exercise Vital Sign    Days of Exercise per Week: 3 days    Minutes of Exercise per Session: 40 min  Stress: No Stress Concern Present (12/05/2021)   [place]    Feeling of Stress : Not at all  Social Connections: Socially Isolated (12/05/2021)   Social Connection and Isolation Panel [NHANES]    Frequency of Communication with Friends and Family: Once a week    Frequency of Social Gatherings with Friends and Family: Once a week    Attends Religious Services: 1 to 4 times per year    Active Member of [employer] or Organizations: No    Attends Music therapist: Never    Marital Status: Divorced    Her Allergies Are:  Allergies  Allergen Reactions    Bee Venom Anaphylaxis    Yellow jackets Yellow jackets   Penicillins Hives    Has patient had a PCN reaction causing immediate rash, facial/tongue/throat swelling, SOB or lightheadedness with hypotension: Yes  Has patient had a PCN reaction causing severe rash involving mucus membranes or skin necrosis: No Has patient had a PCN reaction that required hospitalization No Has patient had a PCN reaction occurring within the last 10 years: No If all of the above answers are "NO", then may proceed with Cephalosporin use.   :   Her Current Medications Are:  Outpatient Encounter Medications as of 12/11/2021  Medication Sig   acetaminophen (TYLENOL) 500 MG tablet Take 2 tablets (1,000 mg total) by mouth every 8 (eight) hours as needed for moderate pain.   albuterol (VENTOLIN HFA) 108 (90 Base) MCG/ACT inhaler Inhale 2 puffs into the lungs every 4 (four) hours as needed for wheezing or shortness of breath.   amLODipine-olmesartan (AZOR) 10-40 MG tablet Take 1 tablet by mouth daily.   Benralizumab (FASENRA PEN) 30 MG/ML SOAJ Inject 1 mL (30 mg total) into the skin every 8 (eight) weeks.   Blood Glucose Monitoring Suppl (TRUE METRIX METER) w/Device KIT Use as instructed   budesonide-formoterol (SYMBICORT) 160-4.5 MCG/ACT inhaler INHALE 2 PUFFS BY MOUTH INTO THE LUNGS TWICE DAILY RINSE GARGLE AND SPIT AFTER USE   carbamazepine (TEGRETOL-XR) 200 MG 12 hr tablet Take 1 tablet (200 mg total) by mouth daily.   cholecalciferol (VITAMIN D3) 25 MCG (1000 UNIT) tablet Take 1 tablet (1,000 Units total) by mouth daily.   diclofenac Sodium (VOLTAREN) 1 % GEL Apply to small joints of the hands and wrist up to 4 times daily   DULoxetine (CYMBALTA) 30 MG capsule TAKE 2 CAPSULES EVERY DAY   empagliflozin (JARDIANCE) 10 MG TABS tablet Take 1 tablet (10 mg total) by mouth daily.   EPINEPHrine 0.3 mg/0.3 mL IJ SOAJ injection Use as directed for severe allergic reaction   EPINEPHrine 0.3 mg/0.3 mL IJ SOAJ injection  Inject 0.3 mg into the muscle as needed for anaphylaxis.   esomeprazole (NEXIUM) 40 MG capsule Take 1 capsule (40 mg total) by mouth 2 (two) times daily.   ezetimibe (ZETIA) 10 MG tablet Take 1 tablet (10 mg total) by mouth daily.   fluticasone (FLONASE) 50 MCG/ACT nasal spray Place 2 sprays into both nostrils daily.   glucose blood (TRUE METRIX BLOOD GLUCOSE TEST) test strip Use as instructed   hydrocortisone cream 1 % Apply to affected area 2 times daily   hydrOXYzine (ATARAX) 25 MG tablet Take 1 tablet (25 mg total) by mouth at bedtime. Take before bed for anxiety.   ipratropium-albuterol (DUONEB) 0.5-2.5 (3) MG/3ML SOLN Take 3 mLs by nebulization every 6 (six) hours as needed.   levothyroxine (SYNTHROID) 50 MCG tablet TAKE 1 TABLET EVERY DAY (NEED MD APPOINTMENT FOR REFILLS)   metoprolol succinate (TOPROL XL) 50 MG 24 hr tablet Take 1 tablet (50 mg total) by mouth daily. Take with or immediately following a meal.   montelukast (SINGULAIR) 10 MG tablet Take 1 tablet (10 mg total) by mouth at bedtime.   rosuvastatin (CRESTOR) 40 MG tablet TAKE 1 TABLET EVERY DAY (NEED MD APPOINTMENT FOR REFILLS)   spironolactone (ALDACTONE) 25 MG tablet Take 2 tablets (50 mg total) by mouth daily.   topiramate (TOPAMAX) 50 MG tablet Take 1 tablet (50 mg total) by mouth daily.   triamcinolone cream (KENALOG) 0.1 % Apply 1 application topically daily.   TRUEplus Lancets 33G MISC USE AS DIRECTED   Facility-Administered Encounter Medications as of 12/11/2021  Medication   Benralizumab SOSY 30 mg  :  Review of Systems:  Out of a complete 14 point review of systems,  all are reviewed and negative with the exception of these symptoms as listed below:  Review of Systems  Neurological:        Patient is here alone for consultation for facial pain. Pt states the left side of her face hurts. The pain started months ago. It hurts when she chews. She has sensitivity sometimes to cold or when something touches her face.  She is on Tegretol 200 mg and she feels it helps.    Objective:  Neurological Exam  Physical Exam Physical Examination:   Vitals:   12/11/21 1454 12/11/21 1457  BP: (!) 173/84 (!) 162/78  Pulse: 81     General Examination: The patient is a very pleasant 81 y.o. female in no acute distress. She appears well-developed and well-nourished and well groomed.   HEENT: Normocephalic, atraumatic, pupils are equal, round and reactive to light, extraocular tracking is good without limitation to gaze excursion or nystagmus noted.  Corrective eyeglasses in place.  No photophobia.  Hearing is grossly intact. Face is symmetric with normal facial animation. Speech is clear with no dysarthria noted. There is no hypophonia. There is no lip, neck/head, jaw or voice tremor. Neck is supple with full range of passive and active motion. There are no carotid bruits on auscultation. Oropharynx exam reveals: moderate mouth dryness, full dentures in place, moderate airway crowdingTongue protrudes centrally and palate elevates symmetrically.    Chest: Clear to auscultation without wheezing, rhonchi or crackles noted.   Heart: S1+S2+0, regular and normal without murmurs, rubs or gallops noted.    Abdomen: Soft, non-tender and non-distended.   Extremities: There is 2+ pitting edema in the distal lower extremities bilaterally, particularly around the ankles. No compression socks.   Skin: Warm and dry without trophic changes noted.    Musculoskeletal: exam reveals no obvious joint deformities, but she has right hip pain and walks with a limp, no walking aid.    Neurologically:  Mental status: The patient is awake, alert and oriented in all 4 spheres. Her immediate and remote memory, attention, language skills and fund of knowledge are appropriate. There is no evidence of aphasia, agnosia, apraxia or anomia. Speech is clear with normal prosody and enunciation. Thought process is linear. Mood is normal and affect is  normal.  Cranial nerves II - XII are as described above under HEENT exam.  Motor exam: Normal bulk, strength and tone is noted. There is no obvious tremor. Fine motor skills and coordination: grossly intact.  Cerebellar testing: No dysmetria or intention tremor. There is no truncal or gait ataxia.  Sensory exam: intact to light touch in the upper and lower extremities.  Gait, station and balance: She stands easily. No veering to one side is noted. No leaning to one side is noted. Posture is age-appropriate and stance is narrow based. Gait shows a limp on the right.  No walking aid.     Assessment and Plan:  In summary, JAELIANA LOCOCO is a 81 year old female with an underlying complex medical history of hypertension, hypothyroidism, left bundle branch block, DVT, hyperlipidemia, arthritis, asthma, MGUS, migraines, diabetes, and borderline obesity, who presents for evaluation of her longstanding history of left-sided facial pain.  She has a nonfocal neurological exam and symptomatic relief from carbamazepine long-acting 200 mg once daily.  She had a recent brain MRI in September 2023.  We talked about the possibility of doing a facial/trigeminal MRI if the need arises but since she has a nonfocal exam and feels improved, we mutually  agreed to hold off on any scan at this time.  She is advised to continue with her medications but be mindful of that she is taking potentially sedating medications and this can affect her balance and increase her fall risk.  She fell about 2 weeks ago but did not injure herself.  More recently, she has had flareup of right hip pain and is advised to talk to about a referral to orthopedics.  She is advised to follow-up with you as scheduled and get her refills on her medications from your office or primary care in general.  She is advised to drink more water and increase her water intake to 6 to 8 cups of water per day.  She is encouraged to reduce her soda intake especially  since she is diabetic and she drinks regular soda, her A1c was 7.4 about 2 months ago.  I answered all her questions today and she was in agreement, she will follow-up with you as scheduled. Thank you very much for allowing me to participate in the care of this nice patient. If I can be of any further assistance to you please do not hesitate to call me at [phone].   Sincerely,     Star Age, MD, PhD  I spent 40 minutes in total face-to-face time and in reviewing records during pre-charting, more than 50% of which was spent in counseling and coordination of care, reviewing test results, reviewing medications and treatment regimen and/or in discussing or reviewing the diagnosis of facial pain, the prognosis and treatment options. Pertinent laboratory and imaging test results that were available during this visit with the patient were reviewed by me and considered in my medical decision making (see chart for details).

## 2021-12-11 NOTE — Progress Notes (Signed)
Internal Medicine Clinic Attending ? ?Case discussed with Dr. Braswell  At the time of the visit.  We reviewed the resident?s history and exam and pertinent patient test results.  I agree with the assessment, diagnosis, and plan of care documented in the resident?s note.  ?

## 2021-12-11 NOTE — Telephone Encounter (Signed)
Pt was scheduled for an appt today 12/11/21 at 1230 and did not call or show up to appt.  PT called and left voicemail for pt to remind her next appt and to call with any questions or concerns.    Cameron Sprang, PT, MPT [facility] [address] [place], [place], [address] Phone: [phone]   Fax:  [phone] 12/11/21, 12:57 PM

## 2021-12-11 NOTE — Patient Instructions (Signed)
It was nice to see you again today.  Since you have had good results with the carbamazepine from your primary care, you can continue with the treatment through them.  We do not need to make a follow-up appointment in this clinic.  You have had a recent brain MRI.  We can consider a special face MRI if need be down the road but for now, since you feel better and your recent brain MRI was without any obvious acute concerns, we do not have to pursue a scan at this point.  Please try to hydrate better with water, we recommend 6 to 8 cups of water per day on average, 8 ounce size each.  Please reduce your soda intake especially since you drink regular soda and you have diabetes.

## 2021-12-11 NOTE — Progress Notes (Signed)
Reviewed and accepted

## 2021-12-18 ENCOUNTER — Encounter: Payer: Self-pay | Admitting: Rehabilitation

## 2021-12-18 ENCOUNTER — Ambulatory Visit: Payer: Medicare HMO | Admitting: Rehabilitation

## 2021-12-18 DIAGNOSIS — M25551 Pain in right hip: Secondary | ICD-10-CM | POA: Diagnosis present

## 2021-12-18 DIAGNOSIS — R42 Dizziness and giddiness: Secondary | ICD-10-CM | POA: Diagnosis present

## 2021-12-18 DIAGNOSIS — R293 Abnormal posture: Secondary | ICD-10-CM

## 2021-12-18 DIAGNOSIS — M5441 Lumbago with sciatica, right side: Secondary | ICD-10-CM | POA: Diagnosis present

## 2021-12-18 DIAGNOSIS — R2689 Other abnormalities of gait and mobility: Secondary | ICD-10-CM

## 2021-12-18 DIAGNOSIS — M6281 Muscle weakness (generalized): Secondary | ICD-10-CM

## 2021-12-18 NOTE — Therapy (Signed)
OUTPATIENT PHYSICAL THERAPY THORACOLUMBAR TREATMENT   Patient Name: Andrea Lowery MRN: [id number] DOB:13-Sep-1940, 81 y.o., female Today's Date: 12/18/2021   PT End of Session - 12/18/21 1222     Visit Number 5    Number of Visits 17    Date for PT Re-Evaluation 01/20/22    Authorization Type [employer] medicare/medicaid (10th visit PN needed)    Progress Note Due on Visit 10    PT Start Time 1220    PT Stop Time 1308    PT Time Calculation (min) 48 min    Activity Tolerance Patient tolerated treatment well;Other (comment)   nausea   Behavior During Therapy [facility] for tasks assessed/performed             Past Medical History:  Diagnosis Date   Arthritis    Asthma    Asthma 01/10/2016   Last Assessment & Plan:  After recent ED eval- currrently utilizing prednisone as prescribed. Begin Levaquin, prednisone and Albuterol MDI to be utilized as needed for acute wheezing FU in 2 day is no improvement or worsening of symptoms. Pt verbalizes understanding for current treatment plan. Follow-up in one week. Plan to increase chronic inhaled steroid dose to assess further with prevention of   Diabetes mellitus without complication ([place])    Diabetes II   Difficulty sleeping 12/28/2013   Last Assessment & Plan:  Stable with trazodone nightly. Continue current treatment plan. Refill trazodone today. Tolerating medication well without side effects. Return in 3 months for follow up or sooner if any worsening of symptoms.    DOE (dyspnea on exertion) 07/01/2012   DVT (deep venous thrombosis) ([place])    "Years ago"   Dyslipidemia    Eczema    GERD (gastroesophageal reflux disease)    Headache    Migraine   Hypertension    Hypokalemia 04/30/2016   Hypothyroidism    LBBB (left bundle branch block)    MGUS (monoclonal gammopathy of unknown significance)    MGUS (monoclonal gammopathy of unknown significance)    Migraine    OSA on CPAP    Sinus infection    had sinus surgery   Past  Surgical History:  Procedure Laterality Date   ABDOMINAL HYSTERECTOMY  1969   ARTERY BIOPSY N/A 11/07/2014   Procedure: BIOPSY TEMPORAL ARTERY;  Surgeon: Leta Baptist, MD;  Location: [place] OR;  Service: ENT;  Laterality: N/A;   CARPAL TUNNEL RELEASE Right    CHOLECYSTECTOMY  1976   ENDOVENOUS ABLATION SAPHENOUS VEIN W/ LASER Left 08/24/2020   endovenous laser ablation left greater saphenous vein by Gae Gallop MD   LUMBAR LAMINECTOMY/DECOMPRESSION MICRODISCECTOMY N/A 07/31/2015   Procedure: L4-5 Decompression, Possible Right L4-5 Microdiscectomy;  Surgeon: Marybelle Killings, MD;  Location: [place];  Service: Orthopedics;  Laterality: N/A;   NASAL SINUS SURGERY     "years ago"   NM MYOVIEW LTD  02/13/2010   No ischemia   PLANTAR FASCIA RELEASE     TONSILLECTOMY  1965   US ECHOCARDIOGRAPHY  09/20/2008   borderline LVH,mild TR,AOV mildly sclerotic w/ca+ of the leaflets   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   Subacute cough 10/22/2021   Vitamin D insufficiency 10/15/2021   De Quervain's tenosynovitis, left 09/21/2021   Urinary frequency 07/25/2021  Protrusion of lumbar intervertebral disc 09/13/2019   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   Eczema 08/02/2016   Osteoporosis without current pathological fracture 05/30/2015   Chronic right-sided low back pain with right-sided sciatica 03/31/2015   Type 2 diabetes mellitus ([place]) 29/52/8413   Diastolic CHF ([place]) 24/40/1027   Sacroiliac dysfunction 11/04/2014   GERD (gastroesophageal reflux disease) 10/05/2014   Hypothyroidism 09/21/2014   Anxiety and depression 03/18/2014   Healthcare maintenance 01/25/2014   Hyperlipidemia 12/27/2013   Bilateral leg edema 07/20/2013   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/2013    Essential hypertension 07/01/2012    PCP: Charise Killian, MD  REFERRING PROVIDER: Charise Killian, MD   REFERRING DIAG: G57.01 (ICD-10-CM) - Piriformis syndrome of right side  R42 (ICD-10-CM) - Dizziness and giddiness  Rationale for Evaluation and Treatment Rehabilitation  THERAPY DIAG:  Dizziness and giddiness  Pain in right hip  Acute right-sided low back pain with right-sided sciatica  Abnormal posture  Other abnormalities of gait and mobility  Muscle weakness (generalized)  ONSET DATE: Approx 2 weeks ago per patient report   SUBJECTIVE:                                                                                                                                                                                           SUBJECTIVE STATEMENT: Pt reports dizziness is somewhat better.  She reports she has been doing the exercises (VOR).     PERTINENT HISTORY:  Trigeminal neuralgia of left side of face, well-controlled type 2 DM, hyperlipidemia, and hypertension   PAIN:  Are you having pain? Yes: NPRS scale: 10/10 Pain location: R hip and down R side of leg Pain description: sharp and shooting  Aggravating factors: walking Relieving factors: getting up, nothing   PRECAUTIONS: Fall  WEIGHT BEARING RESTRICTIONS: No  FALLS:  Has patient fallen in last 6 months? Yes. Number of falls 2  LIVING ENVIRONMENT: Lives with: lives with their family Lives in: House/apartment Stairs: Yes: External: 1 steps; none Has following equipment at home: None  OCCUPATION: retired, pretty active in church   PLOF: [place]: "I want to be able to wear the shoes I want to wear and walk without any devices."    OBJECTIVE:  TODAY'S TREATMENT:  [place] [facility] PT Treatment:  DATE:   Manual Therapy:  Performed L SL IBT manual therapy with foam roller, gently  rolling from greater trochanter to just above R lateral knee.  Pt with increased tenderness over trochanter and also near distal end of ITB.  Pt did tolerate well.  Educated on how she can have granddaughter do this at home and can use either frozen water bottle or wrap/tap washcloth around rolling pin and utilize this as well.      Therex:   Demo'd and performed supine ITB stretch with strap, pulling RLE across body x 2 reps of 30 secs.  Also demo'd and performed in standing with education that she could do either.  Also did 2 reps of 30 secs in session.  Verbally reviewed (and showed videos) of her current supine HEP.  She is performing some but not all exercises.  Continue to encourage compliance with HEP to reduce pain and improve flexibility.    NMR:    Reassessed R SL with no nystagmus noted but she does continue to have dizziness both with lying down and sitting up therefore provided her with Nestor Lewandowsky exercises and performed in session.  See below for details.    PATIENT EDUCATION:  Education details: Additional HEP exercises Person educated: Patient Education method: Explanation, Demonstration, Tactile cues, and Verbal cues Education comprehension: verbalized understanding, verbal cues required, and needs further education   HOME EXERCISE PROGRAM: Access Code: [email or web address] URL: [email or web address] Date: 12/18/2021 Prepared by: Cameron Sprang  Exercises - Supine Bridge  - 1 x daily - 4-5 x weekly - 2 sets - 12 reps - Supine Single Knee to Chest  - 1 x daily - 5 x weekly - 1 sets - 3 reps - 30 seconds hold - Supine Figure 4 Piriformis Stretch  - 1 x daily - 5 x weekly - 3 sets - 10 reps - Seated Gaze Stabilization with Head Rotation  - 1 x daily - 3 x weekly - 3 sets - 10 reps - Seated Gaze Stabilization with Head Nod  - 1 x daily - 3 x weekly - 3 sets - 10 reps - Supine ITB Stretch with Strap  - 1 x daily - 7 x weekly - 1 sets - 3 reps - 30 secs hold - Standing ITB  Stretch  - 1 x daily - 7 x weekly - 1 sets - 3 reps - 30 secs  hold - Brandt-Daroff Vestibular Exercise  - 2 x daily - 7 x weekly - 1 sets - 5 reps  ASSESSMENT:  CLINICAL IMPRESSION: Continue to focus on improving LE flexibility and reducing pain. She reports 10/10 R hip pain today, therefore focused on manual therapy and stretching for a possible bursitis and following, reports improvement to 7/10 pain.  Added exercises to HEP.  Also re-assessed R SL test with no nystagmus but likely motion sensitivity therefore added Nestor Lewandowsky to address this.     OBJECTIVE IMPAIRMENTS: Abnormal gait, decreased activity tolerance, decreased balance, decreased knowledge of use of DME, difficulty walking, decreased ROM, decreased strength, dizziness, hypomobility, impaired perceived functional ability, impaired flexibility, impaired sensation, improper body mechanics, postural dysfunction, and pain.   ACTIVITY LIMITATIONS: carrying, lifting, sitting, stairs, transfers, and locomotion level  PARTICIPATION LIMITATIONS: cleaning, laundry, driving, shopping, and community activity  PERSONAL FACTORS: Age, Social background, Time since onset of injury/illness/exacerbation, and 3+ comorbidities: see above  are also affecting patient's functional outcome.   REHAB POTENTIAL: Good  CLINICAL DECISION MAKING: Evolving/moderate complexity  EVALUATION COMPLEXITY: Moderate  GOALS: Goals reviewed with patient? Yes  SHORT TERM GOALS: Target date: 12/21/2021  Pt will be IND with initial HEP in order to indicate improved functional mobility and dec fall risk. Baseline: Goal status: INITIAL  2.  Will assess FGA and improve score by 3 points from baseline in order to indicate dec fall risk.  Baseline: 13/30 Goal status: INITIAL  3.  Pt will report dec in pain from 8/10 to no more than 5/10 with functional activities.  Baseline:  Goal status: INITIAL  4.  Pt will demonstrate improved lumbar/SIJ mobility in  order to perform ADLs with less pain.  Baseline:  Goal status: INITIAL  5.  Pt will improve DVA to within 2 lines of difference in order to demonstrate improved VOR. Baseline: 3 lines difference Goal status: INITIAL    LONG TERM GOALS: Target date: 01/20/2022  Pt will be IND with final HEP in order to indicate improved functional mobility and dec fall risk. Baseline:  Goal status: INITIAL  2.  Pt will report no more than 3/10 pain with functional tasks.  Baseline:  Goal status: INITIAL  3.  Pt will improve FGA by 6 points from baseline in order to indicate reduced fall risk.  Baseline: 13/30 Goal status: INITIAL  4.  Pt will ambulate with gait speed of >/=2.62 ft/sec with less compensations in order to indicate safe community mobility.  Baseline: 2.32 ft/sec no AD Goal status: INITIAL  5.  Pt will ambulate x 500' over unlevel paved surfaces while scanning environment without overt LOB in order to indicate safe community mobility.   Baseline:  Goal status: INITIAL  6.  Pt will be able to cross R LE over LLE in order to indicate improved flexibility and ability to get dressed.  Baseline:  Goal status: INITIAL   PLAN: PT FREQUENCY: 2x/week  PT DURATION: 8 weeks  PLANNED INTERVENTIONS: Therapeutic exercises, Therapeutic activity, Neuromuscular re-education, Balance training, Gait training, Patient/Family education, Self Care, Joint mobilization, Stair training, Vestibular training, Canalith repositioning, DME instructions, Aquatic Therapy, Dry Needling, Spinal mobilization, Moist heat, Taping, Traction, and Manual therapy.  PLAN FOR NEXT SESSION: STGS!!  Gentle stretching/strengthening and add to HEP, gentle lumbar/SIJ mobs if she will tolerate.  Core and glut strength.   Cameron Sprang, PT, MPT [facility] [address] [place], [place], [address] Phone: [phone]   Fax:  [phone] 12/18/21, 1:28 PM  12/18/21, 1:16 PM

## 2021-12-20 ENCOUNTER — Encounter: Payer: Self-pay | Admitting: Rehabilitation

## 2021-12-20 ENCOUNTER — Ambulatory Visit: Payer: Medicare HMO | Admitting: Rehabilitation

## 2021-12-20 DIAGNOSIS — R2689 Other abnormalities of gait and mobility: Secondary | ICD-10-CM

## 2021-12-20 DIAGNOSIS — M6281 Muscle weakness (generalized): Secondary | ICD-10-CM

## 2021-12-20 DIAGNOSIS — R293 Abnormal posture: Secondary | ICD-10-CM

## 2021-12-20 DIAGNOSIS — M25551 Pain in right hip: Secondary | ICD-10-CM

## 2021-12-20 DIAGNOSIS — M5441 Lumbago with sciatica, right side: Secondary | ICD-10-CM

## 2021-12-20 NOTE — Therapy (Signed)
OUTPATIENT PHYSICAL THERAPY THORACOLUMBAR TREATMENT   Patient Name: Andrea Lowery MRN: [id number] DOB:07-07-1940, 81 y.o., female Today's Date: 12/20/2021   PT End of Session - 12/20/21 1151     Visit Number 6    Number of Visits 17    Date for PT Re-Evaluation 01/20/22    Authorization Type [employer] medicare/medicaid (10th visit PN needed)    Progress Note Due on Visit 10    PT Start Time 1147    PT Stop Time 1230    PT Time Calculation (min) 43 min    Activity Tolerance Patient tolerated treatment well;Other (comment)   nausea   Behavior During Therapy [facility] for tasks assessed/performed             Past Medical History:  Diagnosis Date   Arthritis    Asthma    Asthma 01/10/2016   Last Assessment & Plan:  After recent ED eval- currrently utilizing prednisone as prescribed. Begin Levaquin, prednisone and Albuterol MDI to be utilized as needed for acute wheezing FU in 2 day is no improvement or worsening of symptoms. Pt verbalizes understanding for current treatment plan. Follow-up in one week. Plan to increase chronic inhaled steroid dose to assess further with prevention of   Diabetes mellitus without complication ([place])    Diabetes II   Difficulty sleeping 12/28/2013   Last Assessment & Plan:  Stable with trazodone nightly. Continue current treatment plan. Refill trazodone today. Tolerating medication well without side effects. Return in 3 months for follow up or sooner if any worsening of symptoms.    DOE (dyspnea on exertion) 07/01/2012   DVT (deep venous thrombosis) ([place])    "Years ago"   Dyslipidemia    Eczema    GERD (gastroesophageal reflux disease)    Headache    Migraine   Hypertension    Hypokalemia 04/30/2016   Hypothyroidism    LBBB (left bundle branch block)    MGUS (monoclonal gammopathy of unknown significance)    MGUS (monoclonal gammopathy of unknown significance)    Migraine    OSA on CPAP    Sinus infection    had sinus surgery   Past  Surgical History:  Procedure Laterality Date   ABDOMINAL HYSTERECTOMY  1969   ARTERY BIOPSY N/A 11/07/2014   Procedure: BIOPSY TEMPORAL ARTERY;  Surgeon: Leta Baptist, MD;  Location: [place] OR;  Service: ENT;  Laterality: N/A;   CARPAL TUNNEL RELEASE Right    CHOLECYSTECTOMY  1976   ENDOVENOUS ABLATION SAPHENOUS VEIN W/ LASER Left 08/24/2020   endovenous laser ablation left greater saphenous vein by Gae Gallop MD   LUMBAR LAMINECTOMY/DECOMPRESSION MICRODISCECTOMY N/A 07/31/2015   Procedure: L4-5 Decompression, Possible Right L4-5 Microdiscectomy;  Surgeon: Marybelle Killings, MD;  Location: [place];  Service: Orthopedics;  Laterality: N/A;   NASAL SINUS SURGERY     "years ago"   NM MYOVIEW LTD  02/13/2010   No ischemia   PLANTAR FASCIA RELEASE     TONSILLECTOMY  1965   US ECHOCARDIOGRAPHY  09/20/2008   borderline LVH,mild TR,AOV mildly sclerotic w/ca+ of the leaflets   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   Subacute cough 10/22/2021   Vitamin D insufficiency 10/15/2021   De Quervain's tenosynovitis, left 09/21/2021   Urinary frequency 07/25/2021  Protrusion of lumbar intervertebral disc 09/13/2019   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   Eczema 08/02/2016   Osteoporosis without current pathological fracture 05/30/2015   Chronic right-sided low back pain with right-sided sciatica 03/31/2015   Type 2 diabetes mellitus ([place]) 22/97/9892   Diastolic CHF ([place]) 11/94/1740   Sacroiliac dysfunction 11/04/2014   GERD (gastroesophageal reflux disease) 10/05/2014   Hypothyroidism 09/21/2014   Anxiety and depression 03/18/2014   Healthcare maintenance 01/25/2014   Hyperlipidemia 12/27/2013   Bilateral leg edema 07/20/2013   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/2013    Essential hypertension 07/01/2012    PCP: Charise Killian, MD  REFERRING PROVIDER: Charise Killian, MD   REFERRING DIAG: G57.01 (ICD-10-CM) - Piriformis syndrome of right side  R42 (ICD-10-CM) - Dizziness and giddiness  Rationale for Evaluation and Treatment Rehabilitation  THERAPY DIAG:  Pain in right hip  Acute right-sided low back pain with right-sided sciatica  Abnormal posture  Other abnormalities of gait and mobility  Muscle weakness (generalized)  ONSET DATE: Approx 2 weeks ago per patient report   SUBJECTIVE:                                                                                                                                                                                           SUBJECTIVE STATEMENT: Pt reports that hip is better, knee is bothering her today.     PERTINENT HISTORY:  Trigeminal neuralgia of left side of face, well-controlled type 2 DM, hyperlipidemia, and hypertension   PAIN:  Are you having pain? Yes: NPRS scale: 10/10 Pain location: R hip and down R side of leg Pain description: sharp and shooting  Aggravating factors: walking Relieving factors: getting up, nothing   PRECAUTIONS: Fall  WEIGHT BEARING RESTRICTIONS: No  FALLS:  Has patient fallen in last 6 months? Yes. Number of falls 2  LIVING ENVIRONMENT: Lives with: lives with their family Lives in: House/apartment Stairs: Yes: External: 1 steps; none Has following equipment at home: None  OCCUPATION: retired, pretty active in church   PLOF: [place]: "I want to be able to wear the shoes I want to wear and walk without any devices."    OBJECTIVE:  TODAY'S TREATMENT:  [place] Adult PT Treatment:  DATE:   Manual Therapy:  Performed L SL IBT manual therapy with foam roller, gently rolling from greater trochanter to just above R lateral knee.   Pt with increased tenderness over trochanter and also near distal end of ITB.  Pt did tolerate well.  Performed x 8 mins with off and on pressure.       NMR:    Performed FGA see below, educated on results and improvement in balance.   DVA performed with 2 line difference today, meeting goal.      [facility] PT Assessment - 12/20/21 1156       Functional Gait  Assessment   Gait assessed  Yes    Gait Level Surface Walks 20 ft in less than 7 sec but greater than 5.5 sec, uses assistive device, slower speed, mild gait deviations, or deviates 6-10 in outside of the 12 in walkway width.    Change in Gait Speed Able to smoothly change walking speed without loss of balance or gait deviation. Deviate no more than 6 in outside of the 12 in walkway width.    Gait with Horizontal Head Turns Performs head turns smoothly with no change in gait. Deviates no more than 6 in outside 12 in walkway width    Gait with Vertical Head Turns Performs task with slight change in gait velocity (eg, minor disruption to smooth gait path), deviates 6 - 10 in outside 12 in walkway width or uses assistive device    Gait and Pivot Turn Pivot turns safely within 3 sec and stops quickly with no loss of balance.    Step Over Obstacle Is able to step over one shoe box (4.5 in total height) without changing gait speed. No evidence of imbalance.    Gait with Narrow Base of Support Ambulates 4-7 steps.    Gait with Eyes Closed Walks 20 ft, uses assistive device, slower speed, mild gait deviations, deviates 6-10 in outside 12 in walkway width. Ambulates 20 ft in less than 9 sec but greater than 7 sec.    Ambulating Backwards Walks 20 ft, uses assistive device, slower speed, mild gait deviations, deviates 6-10 in outside 12 in walkway width.    Steps Alternating feet, must use rail.    Total Score 22    FGA comment: 19-24 = medium risk fall              PATIENT EDUCATION:  Education details: Continue with exercises Person  educated: Patient Education method: Explanation, Demonstration, Tactile cues, and Verbal cues Education comprehension: verbalized understanding, verbal cues required, and needs further education   HOME EXERCISE PROGRAM: Access Code: [email or web address] URL: [email or web address] Date: 12/18/2021 Prepared by: Cameron Sprang  Exercises - Supine Bridge  - 1 x daily - 4-5 x weekly - 2 sets - 12 reps - Supine Single Knee to Chest  - 1 x daily - 5 x weekly - 1 sets - 3 reps - 30 seconds hold - Supine Figure 4 Piriformis Stretch  - 1 x daily - 5 x weekly - 3 sets - 10 reps - Seated Gaze Stabilization with Head Rotation  - 1 x daily - 3 x weekly - 3 sets - 10 reps - Seated Gaze Stabilization with Head Nod  - 1 x daily - 3 x weekly - 3 sets - 10 reps - Supine ITB Stretch with Strap  - 1 x daily - 7 x weekly - 1 sets - 3 reps - 30 secs hold - Standing ITB Stretch  -  1 x daily - 7 x weekly - 1 sets - 3 reps - 30 secs  hold - Brandt-Daroff Vestibular Exercise  - 2 x daily - 7 x weekly - 1 sets - 5 reps  ASSESSMENT:  CLINICAL IMPRESSION: Skilled session focused on assessment of STGs. Pt has met 4/5 goals, partially meeting pain goal as she still intermittently has elevated pain, but overall is improved from baseline.  Pt making excellent progress towards LTGs and will update FGA goal.     OBJECTIVE IMPAIRMENTS: Abnormal gait, decreased activity tolerance, decreased balance, decreased knowledge of use of DME, difficulty walking, decreased ROM, decreased strength, dizziness, hypomobility, impaired perceived functional ability, impaired flexibility, impaired sensation, improper body mechanics, postural dysfunction, and pain.   ACTIVITY LIMITATIONS: carrying, lifting, sitting, stairs, transfers, and locomotion level  PARTICIPATION LIMITATIONS: cleaning, laundry, driving, shopping, and community activity  PERSONAL FACTORS: Age, Social background, Time since onset of injury/illness/exacerbation, and  3+ comorbidities: see above  are also affecting patient's functional outcome.   REHAB POTENTIAL: Good  CLINICAL DECISION MAKING: Evolving/moderate complexity  EVALUATION COMPLEXITY: Moderate   GOALS: Goals reviewed with patient? Yes  SHORT TERM GOALS: Target date: 12/21/2021  Pt will be IND with initial HEP in order to indicate improved functional mobility and dec fall risk. Baseline: Goal status: MET  2.  Will assess FGA and improve score by 3 points from baseline in order to indicate dec fall risk.  Baseline: 13/30 to 22/30 Goal status: MET  3.  Pt will report dec in pain from 8/10 to no more than 5/10 with functional activities.  Baseline: is 5/10 today but still increases from time to time Goal status: IN PROGRESS  4.  Pt will demonstrate improved lumbar/SIJ mobility in order to perform ADLs with less pain.  Baseline:  Goal status: MET  5.  Pt will improve DVA to within 2 lines of difference in order to demonstrate improved VOR. Baseline: 2 lines difference Goal status: MET    LONG TERM GOALS: Target date: 01/20/2022  Pt will be IND with final HEP in order to indicate improved functional mobility and dec fall risk. Baseline:  Goal status: INITIAL  2.  Pt will report no more than 3/10 pain with functional tasks.  Baseline:  Goal status: INITIAL  3.  Pt will improve FGA to >/=26 in order to indicate reduced fall risk.  Baseline: 13/30 Goal status: REVISED  4.  Pt will ambulate with gait speed of >/=2.62 ft/sec with less compensations in order to indicate safe community mobility.  Baseline: 2.32 ft/sec no AD Goal status: INITIAL  5.  Pt will ambulate x 500' over unlevel paved surfaces while scanning environment without overt LOB in order to indicate safe community mobility.   Baseline:  Goal status: INITIAL  6.  Pt will be able to cross R LE over LLE in order to indicate improved flexibility and ability to get dressed.  Baseline:  Goal status:  INITIAL   PLAN: PT FREQUENCY: 2x/week  PT DURATION: 8 weeks  PLANNED INTERVENTIONS: Therapeutic exercises, Therapeutic activity, Neuromuscular re-education, Balance training, Gait training, Patient/Family education, Self Care, Joint mobilization, Stair training, Vestibular training, Canalith repositioning, DME instructions, Aquatic Therapy, Dry Needling, Spinal mobilization, Moist heat, Taping, Traction, and Manual therapy.  PLAN FOR NEXT SESSION:  Her pain is much better, work on hip as needed but I would focus on balance at this time. Gentle stretching/strengthening and add to HEP, gentle lumbar/SIJ mobs if she will tolerate.  Core and glute strength.  Cameron Sprang, PT, MPT [facility] [address] [place], [place], [address] Phone: [phone]   Fax:  [phone] 12/20/21, 12:47 PM

## 2021-12-25 ENCOUNTER — Encounter: Payer: Self-pay | Admitting: Physical Therapy

## 2021-12-25 ENCOUNTER — Ambulatory Visit: Payer: Medicare HMO | Admitting: Physical Therapy

## 2021-12-25 DIAGNOSIS — M25551 Pain in right hip: Secondary | ICD-10-CM

## 2021-12-25 DIAGNOSIS — R2689 Other abnormalities of gait and mobility: Secondary | ICD-10-CM

## 2021-12-25 DIAGNOSIS — M5441 Lumbago with sciatica, right side: Secondary | ICD-10-CM

## 2021-12-25 DIAGNOSIS — M6281 Muscle weakness (generalized): Secondary | ICD-10-CM

## 2021-12-25 DIAGNOSIS — R293 Abnormal posture: Secondary | ICD-10-CM

## 2021-12-25 DIAGNOSIS — R42 Dizziness and giddiness: Secondary | ICD-10-CM

## 2021-12-25 NOTE — Therapy (Signed)
OUTPATIENT PHYSICAL THERAPY THORACOLUMBAR TREATMENT   Patient Name: Andrea Lowery MRN: [id number] DOB:July 25, 1940, 81 y.o., female Today's Date: 12/25/2021   PT End of Session - 12/25/21 1407     Visit Number 7    Number of Visits 17    Date for PT Re-Evaluation 01/20/22    Authorization Type [employer] medicare/medicaid (10th visit PN needed)    Progress Note Due on Visit 10    PT Start Time 1402    PT Stop Time 1446    PT Time Calculation (min) 44 min    Activity Tolerance Patient tolerated treatment well;Other (comment)   nausea   Behavior During Therapy [facility] for tasks assessed/performed             Past Medical History:  Diagnosis Date   Arthritis    Asthma    Asthma 01/10/2016   Last Assessment & Plan:  After recent ED eval- currrently utilizing prednisone as prescribed. Begin Levaquin, prednisone and Albuterol MDI to be utilized as needed for acute wheezing FU in 2 day is no improvement or worsening of symptoms. Pt verbalizes understanding for current treatment plan. Follow-up in one week. Plan to increase chronic inhaled steroid dose to assess further with prevention of   Diabetes mellitus without complication ([place])    Diabetes II   Difficulty sleeping 12/28/2013   Last Assessment & Plan:  Stable with trazodone nightly. Continue current treatment plan. Refill trazodone today. Tolerating medication well without side effects. Return in 3 months for follow up or sooner if any worsening of symptoms.    DOE (dyspnea on exertion) 07/01/2012   DVT (deep venous thrombosis) ([place])    "Years ago"   Dyslipidemia    Eczema    GERD (gastroesophageal reflux disease)    Headache    Migraine   Hypertension    Hypokalemia 04/30/2016   Hypothyroidism    LBBB (left bundle branch block)    MGUS (monoclonal gammopathy of unknown significance)    MGUS (monoclonal gammopathy of unknown significance)    Migraine    OSA on CPAP    Sinus infection    had sinus surgery   Past  Surgical History:  Procedure Laterality Date   ABDOMINAL HYSTERECTOMY  1969   ARTERY BIOPSY N/A 11/07/2014   Procedure: BIOPSY TEMPORAL ARTERY;  Surgeon: Leta Baptist, MD;  Location: [facility];  Service: ENT;  Laterality: N/A;   CARPAL TUNNEL RELEASE Right    CHOLECYSTECTOMY  1976   ENDOVENOUS ABLATION SAPHENOUS VEIN W/ LASER Left 08/24/2020   endovenous laser ablation left greater saphenous vein by Gae Gallop MD   LUMBAR LAMINECTOMY/DECOMPRESSION MICRODISCECTOMY N/A 07/31/2015   Procedure: L4-5 Decompression, Possible Right L4-5 Microdiscectomy;  Surgeon: Marybelle Killings, MD;  Location: [facility];  Service: Orthopedics;  Laterality: N/A;   NASAL SINUS SURGERY     "years ago"   NM MYOVIEW LTD  02/13/2010   No ischemia   PLANTAR FASCIA RELEASE     TONSILLECTOMY  1965   US ECHOCARDIOGRAPHY  09/20/2008   borderline LVH,mild TR,AOV mildly sclerotic w/ca+ of the leaflets   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   Subacute cough 10/22/2021   Vitamin D insufficiency 10/15/2021   De Quervain's tenosynovitis, left 09/21/2021   Urinary frequency 07/25/2021  Protrusion of lumbar intervertebral disc 09/13/2019   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   Eczema 08/02/2016   Osteoporosis without current pathological fracture 05/30/2015   Chronic right-sided low back pain with right-sided sciatica 03/31/2015   Type 2 diabetes mellitus ([place]) 81/82/9937   Diastolic CHF ([place]) 16/96/7893   Sacroiliac dysfunction 11/04/2014   GERD (gastroesophageal reflux disease) 10/05/2014   Hypothyroidism 09/21/2014   Anxiety and depression 03/18/2014   Healthcare maintenance 01/25/2014   Hyperlipidemia 12/27/2013   Bilateral leg edema 07/20/2013   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/2013    Essential hypertension 07/01/2012    PCP: Charise Killian, MD  REFERRING PROVIDER: Charise Killian, MD   REFERRING DIAG: G57.01 (ICD-10-CM) - Piriformis syndrome of right side  R42 (ICD-10-CM) - Dizziness and giddiness  Rationale for Evaluation and Treatment Rehabilitation  THERAPY DIAG:  Pain in right hip  Acute right-sided low back pain with right-sided sciatica  Other abnormalities of gait and mobility  Muscle weakness (generalized)  Dizziness and giddiness  Abnormal posture  ONSET DATE: Approx 2 weeks ago per patient report   SUBJECTIVE:                                                                                                                                                                                           SUBJECTIVE STATEMENT: Pt reports no acute changes at home or falls.  Right knee is her primary pain complaint today, but feels it's related to weather change.   PERTINENT HISTORY:  Trigeminal neuralgia of left side of face, well-controlled type 2 DM, hyperlipidemia, and hypertension   PAIN:  Are you having pain? Yes: NPRS scale: 5/10 Pain location: R knee down back of leg Pain description: achy  Aggravating factors: walking Relieving factors: getting up, nothing   PRECAUTIONS: Fall  WEIGHT BEARING RESTRICTIONS: No  FALLS:  Has patient fallen in last 6 months? Yes. Number of falls 2  LIVING ENVIRONMENT: Lives with: lives with their family Lives in: House/apartment Stairs: Yes: External: 1 steps; none Has following equipment at home: None  OCCUPATION: retired, pretty active in church   PLOF: [place]: "I want to be able to wear the shoes I want to wear and walk without any devices."    OBJECTIVE:  TODAY'S TREATMENT:  [facility] Treatment:                                                                                                                              -  Backwards walking 4 x40' CGA-SBA w/ pt drifting to right due  to pulling left LE into narrowed BOS w/ backwards step, cued for correction -Forwards and backwards tandem 6x12' w/ RUE support on counter - added to HEP, pt cued for inc step size -Feet together eyes open x41mn > feet together eyes closed, significant sway noted w/ LOB using reach to catch herself in bars, several reps performed <10 sec each > progressed to same exercises on dark blue foam w/ pt having worse LOB posteriorly when eyes closed, regressed to feet apart for reps w/ eyes open and then closed w/ some improvement, 262m rep w/ feet apart and eyes closed w/ therapist correcting posture allowing pt to feel center of mass in upright position, pt has tendency to tilt head posteriorly before closing eyes -Tandem hold x1 min each LE in rear progressing to no UE support -Tandem forward walking on beam w/ intermittent release of hand support -Standing feet apart EO on incline x45 sec > feet apart eyes closed on incline x45 sec -standing on airex w/ x20 cone taps to midline w/ CGA and single UE support > x10 unsupported w/ left posterior LOB requiring modA to correct on rep 8, pt requires cues to pace activity and to be intentional with foot placement on return to airex.  PATIENT EDUCATION:  Education details: Continue with exercises.  Potential reasons eyes closed is so hard for her-edu on contributing balance systems and vestibular hypofunction, etc. Person educated: Patient Education method: Explanation, Demonstration, Tactile cues, and Verbal cues Education comprehension: verbalized understanding, verbal cues required, and needs further education   HOME EXERCISE PROGRAM: Access Code: [email or web address]: [email or web address] Date: 12/25/2021 Prepared by: MaElease EtienneExercises - Supine Bridge  - 1 x daily - 4-5 x weekly - 2 sets - 12 reps - Supine Single Knee to Chest  - 1 x daily - 5 x weekly - 1 sets - 3 reps - 30 seconds hold - Supine Figure 4 Piriformis Stretch  - 1 x  daily - 5 x weekly - 3 sets - 10 reps - Seated Gaze Stabilization with Head Rotation  - 1 x daily - 3 x weekly - 3 sets - 10 reps - Seated Gaze Stabilization with Head Nod  - 1 x daily - 3 x weekly - 3 sets - 10 reps - Supine ITB Stretch with Strap  - 1 x daily - 7 x weekly - 1 sets - 3 reps - 30 secs hold - Standing ITB Stretch  - 1 x daily - 7 x weekly - 1 sets - 3 reps - 30 secs  hold - Brandt-Daroff Vestibular Exercise  - 2 x daily - 7 x weekly - 1 sets - 5 reps - Tandem Walking with Counter Support  - 1 x daily - 7 x weekly - 3 sets - 10 reps - Backward Tandem Walking with Counter Support  - 1 x daily - 7 x weekly - 3 sets - 10 reps  ASSESSMENT:  CLINICAL IMPRESSION: Focused solely on patient's balance deficits this session addressing vestibular contribution to imbalance.  Education provided on difficulties noted with vestibular reliant tasks.  Pt continues to benefit from skilled PT to address dynamic imbalance and fall prevention.    OBJECTIVE IMPAIRMENTS: Abnormal gait, decreased activity tolerance, decreased balance, decreased knowledge of use of DME, difficulty walking, decreased ROM, decreased strength, dizziness, hypomobility, impaired perceived functional ability, impaired flexibility, impaired sensation, improper body mechanics, postural dysfunction, and pain.   ACTIVITY  LIMITATIONS: carrying, lifting, sitting, stairs, transfers, and locomotion level  PARTICIPATION LIMITATIONS: cleaning, laundry, driving, shopping, and community activity  PERSONAL FACTORS: Age, Social background, Time since onset of injury/illness/exacerbation, and 3+ comorbidities: see above  are also affecting patient's functional outcome.   REHAB POTENTIAL: Good  CLINICAL DECISION MAKING: Evolving/moderate complexity  EVALUATION COMPLEXITY: Moderate   GOALS: Goals reviewed with patient? Yes  SHORT TERM GOALS: Target date: 12/21/2021  Pt will be IND with initial HEP in order to indicate improved  functional mobility and dec fall risk. Baseline: Goal status: MET  2.  Will assess FGA and improve score by 3 points from baseline in order to indicate dec fall risk.  Baseline: 13/30 to 22/30 Goal status: MET  3.  Pt will report dec in pain from 8/10 to no more than 5/10 with functional activities.  Baseline: is 5/10 today but still increases from time to time Goal status: IN PROGRESS  4.  Pt will demonstrate improved lumbar/SIJ mobility in order to perform ADLs with less pain.  Baseline:  Goal status: MET  5.  Pt will improve DVA to within 2 lines of difference in order to demonstrate improved VOR. Baseline: 2 lines difference Goal status: MET    LONG TERM GOALS: Target date: 01/20/2022  Pt will be IND with final HEP in order to indicate improved functional mobility and dec fall risk. Baseline:  Goal status: INITIAL  2.  Pt will report no more than 3/10 pain with functional tasks.  Baseline:  Goal status: INITIAL  3.  Pt will improve FGA to >/=26 in order to indicate reduced fall risk.  Baseline: 13/30 Goal status: REVISED  4.  Pt will ambulate with gait speed of >/=2.62 ft/sec with less compensations in order to indicate safe community mobility.  Baseline: 2.32 ft/sec no AD Goal status: INITIAL  5.  Pt will ambulate x 500' over unlevel paved surfaces while scanning environment without overt LOB in order to indicate safe community mobility.   Baseline:  Goal status: INITIAL  6.  Pt will be able to cross R LE over LLE in order to indicate improved flexibility and ability to get dressed.  Baseline:  Goal status: INITIAL   PLAN: PT FREQUENCY: 2x/week  PT DURATION: 8 weeks  PLANNED INTERVENTIONS: Therapeutic exercises, Therapeutic activity, Neuromuscular re-education, Balance training, Gait training, Patient/Family education, Self Care, Joint mobilization, Stair training, Vestibular training, Canalith repositioning, DME instructions, Aquatic Therapy, Dry Needling,  Spinal mobilization, Moist heat, Taping, Traction, and Manual therapy.  PLAN FOR NEXT SESSION:  Her pain is much better, work on hip as needed but I would focus on balance at this time-eyes closed, did not add to HEP due to risk of falling at home w/ significant LOB noted in clinic. Gentle stretching/strengthening and add to HEP, gentle lumbar/SIJ mobs if she will tolerate.  Core and glute strength.     Elease Etienne, PT, [place] [address] [place], [place], [address] Phone: [phone]   Fax:  [phone] 12/25/21, 4:20 PM

## 2021-12-25 NOTE — Patient Instructions (Signed)
Access Code: [email or web address] URL: [email or web address] Date: 12/25/2021 Prepared by: Elease Etienne  Exercises - Supine Bridge  - 1 x daily - 4-5 x weekly - 2 sets - 12 reps - Supine Single Knee to Chest  - 1 x daily - 5 x weekly - 1 sets - 3 reps - 30 seconds hold - Supine Figure 4 Piriformis Stretch  - 1 x daily - 5 x weekly - 3 sets - 10 reps - Seated Gaze Stabilization with Head Rotation  - 1 x daily - 3 x weekly - 3 sets - 10 reps - Seated Gaze Stabilization with Head Nod  - 1 x daily - 3 x weekly - 3 sets - 10 reps - Supine ITB Stretch with Strap  - 1 x daily - 7 x weekly - 1 sets - 3 reps - 30 secs hold - Standing ITB Stretch  - 1 x daily - 7 x weekly - 1 sets - 3 reps - 30 secs  hold - Brandt-Daroff Vestibular Exercise  - 2 x daily - 7 x weekly - 1 sets - 5 reps - Tandem Walking with Counter Support  - 1 x daily - 7 x weekly - 3 sets - 10 reps - Backward Tandem Walking with Counter Support  - 1 x daily - 7 x weekly - 3 sets - 10 reps

## 2022-01-01 ENCOUNTER — Encounter: Payer: Self-pay | Admitting: Rehabilitation

## 2022-01-01 ENCOUNTER — Ambulatory Visit: Payer: Medicare HMO | Admitting: Rehabilitation

## 2022-01-01 DIAGNOSIS — R293 Abnormal posture: Secondary | ICD-10-CM

## 2022-01-01 DIAGNOSIS — R2689 Other abnormalities of gait and mobility: Secondary | ICD-10-CM

## 2022-01-01 DIAGNOSIS — M6281 Muscle weakness (generalized): Secondary | ICD-10-CM

## 2022-01-01 NOTE — Therapy (Signed)
OUTPATIENT PHYSICAL THERAPY THORACOLUMBAR TREATMENT   Patient Name: Andrea Lowery MRN: [id number] DOB:Jun 29, 1940, 81 y.o., female Today's Date: 01/01/2022   PT End of Session - 01/01/22 1235     Visit Number 8    Number of Visits 17    Date for PT Re-Evaluation 01/20/22    Authorization Type [employer] medicare/medicaid (10th visit PN needed)    Progress Note Due on Visit 10    PT Start Time 1235    PT Stop Time 1315    PT Time Calculation (min) 40 min    Activity Tolerance Patient tolerated treatment well;Other (comment)   nausea   Behavior During Therapy [facility] for tasks assessed/performed             Past Medical History:  Diagnosis Date   Arthritis    Asthma    Asthma 01/10/2016   Last Assessment & Plan:  After recent ED eval- currrently utilizing prednisone as prescribed. Begin Levaquin, prednisone and Albuterol MDI to be utilized as needed for acute wheezing FU in 2 day is no improvement or worsening of symptoms. Pt verbalizes understanding for current treatment plan. Follow-up in one week. Plan to increase chronic inhaled steroid dose to assess further with prevention of   Diabetes mellitus without complication ([place])    Diabetes II   Difficulty sleeping 12/28/2013   Last Assessment & Plan:  Stable with trazodone nightly. Continue current treatment plan. Refill trazodone today. Tolerating medication well without side effects. Return in 3 months for follow up or sooner if any worsening of symptoms.    DOE (dyspnea on exertion) 07/01/2012   DVT (deep venous thrombosis) ([place])    "Years ago"   Dyslipidemia    Eczema    GERD (gastroesophageal reflux disease)    Headache    Migraine   Hypertension    Hypokalemia 04/30/2016   Hypothyroidism    LBBB (left bundle branch block)    MGUS (monoclonal gammopathy of unknown significance)    MGUS (monoclonal gammopathy of unknown significance)    Migraine    OSA on CPAP    Sinus infection    had sinus surgery   Past  Surgical History:  Procedure Laterality Date   ABDOMINAL HYSTERECTOMY  1969   ARTERY BIOPSY N/A 11/07/2014   Procedure: BIOPSY TEMPORAL ARTERY;  Surgeon: Leta Baptist, MD;  Location: [facility];  Service: ENT;  Laterality: N/A;   CARPAL TUNNEL RELEASE Right    CHOLECYSTECTOMY  1976   ENDOVENOUS ABLATION SAPHENOUS VEIN W/ LASER Left 08/24/2020   endovenous laser ablation left greater saphenous vein by Gae Gallop MD   LUMBAR LAMINECTOMY/DECOMPRESSION MICRODISCECTOMY N/A 07/31/2015   Procedure: L4-5 Decompression, Possible Right L4-5 Microdiscectomy;  Surgeon: Marybelle Killings, MD;  Location: [facility];  Service: Orthopedics;  Laterality: N/A;   NASAL SINUS SURGERY     "years ago"   NM MYOVIEW LTD  02/13/2010   No ischemia   PLANTAR FASCIA RELEASE     TONSILLECTOMY  1965   US ECHOCARDIOGRAPHY  09/20/2008   borderline LVH,mild TR,AOV mildly sclerotic w/ca+ of the leaflets   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   Subacute cough 10/22/2021   Vitamin D insufficiency 10/15/2021   De Quervain's tenosynovitis, left 09/21/2021   Urinary frequency 07/25/2021  Protrusion of lumbar intervertebral disc 09/13/2019   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   Eczema 08/02/2016   Osteoporosis without current pathological fracture 05/30/2015   Chronic right-sided low back pain with right-sided sciatica 03/31/2015   Type 2 diabetes mellitus ([place]) 51/89/8421   Diastolic CHF ([place]) 04/15/8116   Sacroiliac dysfunction 11/04/2014   GERD (gastroesophageal reflux disease) 10/05/2014   Hypothyroidism 09/21/2014   Anxiety and depression 03/18/2014   Healthcare maintenance 01/25/2014   Hyperlipidemia 12/27/2013   Bilateral leg edema 07/20/2013   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/2013    Essential hypertension 07/01/2012    PCP: Charise Killian, MD  REFERRING PROVIDER: Charise Killian, MD   REFERRING DIAG: G57.01 (ICD-10-CM) - Piriformis syndrome of right side  R42 (ICD-10-CM) - Dizziness and giddiness  Rationale for Evaluation and Treatment Rehabilitation  THERAPY DIAG:  Other abnormalities of gait and mobility  Muscle weakness (generalized)  Abnormal posture  ONSET DATE: Approx 2 weeks ago per patient report   SUBJECTIVE:                                                                                                                                                                                           SUBJECTIVE STATEMENT: Pt reports no changes since last visit, knee is still bothering her today.   PERTINENT HISTORY:  Trigeminal neuralgia of left side of face, well-controlled type 2 DM, hyperlipidemia, and hypertension   PAIN:  Are you having pain? Yes: NPRS scale: 5/10 Pain location: R knee down back of leg Pain description: achy  Aggravating factors: walking Relieving factors: getting up, nothing   PRECAUTIONS: Fall  WEIGHT BEARING RESTRICTIONS: No  FALLS:  Has patient fallen in last 6 months? Yes. Number of falls 2  LIVING ENVIRONMENT: Lives with: lives with their family Lives in: House/apartment Stairs: Yes: External: 1 steps; none Has following equipment at home: None  OCCUPATION: retired, pretty active in church   PLOF: [place]: "I want to be able to wear the shoes I want to wear and walk without any devices."    OBJECTIVE:  TODAY'S TREATMENT:  [place] [facility] Treatment:  Warm up on seated scifit x 6 mins at level 4 resistance with BUEs/LEs with cues to keep rpms in 70-80's. Pt with moderate fatigue following task.    NMR:  In // bars on purple balance beam: Forward tandem x 4 laps, side stepping x  4 laps.  Standing on foam airex with feet together EO x 20 secs>EO w/ head turns side/side and up/down x 10 reps each>EC x 2 sets of 20 secs with intermittent UE support needed and moderate encouragement to try without UE support.  Gait around clinic x 500' with random cues from PT to speed up, stop, change directions, perform head turns up/down and side/side, and retrogait.  Pt with no overt LOB and did well.  Braiding x 50' R and L with light support from PT (facing PT).  Ended in // bars standing on foam beam perpendicularly while PT provided random external perturbations either ant or post to elicit step strategy.  Pt tends to always step with LLE, so cues to change up LE.    Blaze pod activity:  Performed 3 rounds of 30 secs on "random" setting allowing her to use either LE to tap target.  Then set up pods mostly on R side, having her use only RLE to tap to target (2 rounds of 30 secs) then did the same on L side for LLE (2 rounds of 30 secs).  Adjusted the time between light change from 3 secs to 2.5 secs with cues for full weight shift onto stepping foot and only single step to target and back.    PATIENT EDUCATION:  Education details: Continue with exercises.  Potential reasons eyes closed is so hard for her-edu on contributing balance systems and vestibular hypofunction, etc. Person educated: Patient Education method: Explanation, Demonstration, Tactile cues, and Verbal cues Education comprehension: verbalized understanding, verbal cues required, and needs further education   HOME EXERCISE PROGRAM: Access Code: [email or web address] URL: [email or web address] Date: 12/25/2021 Prepared by: Elease Etienne  Exercises - Supine Bridge  - 1 x daily - 4-5 x weekly - 2 sets - 12 reps - Supine Single Knee to Chest  - 1 x daily - 5 x weekly - 1 sets - 3 reps - 30 seconds hold - Supine Figure 4 Piriformis Stretch  - 1 x daily - 5 x weekly - 3 sets - 10 reps - Seated Gaze Stabilization with  Head Rotation  - 1 x daily - 3 x weekly - 3 sets - 10 reps - Seated Gaze Stabilization with Head Nod  - 1 x daily - 3 x weekly - 3 sets - 10 reps - Supine ITB Stretch with Strap  - 1 x daily - 7 x weekly - 1 sets - 3 reps - 30 secs hold - Standing ITB Stretch  - 1 x daily - 7 x weekly - 1 sets - 3 reps - 30 secs  hold - Brandt-Daroff Vestibular Exercise  - 2 x daily - 7 x weekly - 1 sets - 5 reps - Tandem Walking with Counter Support  - 1 x daily - 7 x weekly - 3 sets - 10 reps - Backward Tandem Walking with Counter Support  - 1 x daily - 7 x weekly - 3 sets - 10 reps  ASSESSMENT:  CLINICAL IMPRESSION: Skilled session continues to focus on higher level balance on compliant surface with ankle, hip and especially stepping strategy.  Pt tolerated well during session, still having most difficulty on foam with EC.  OBJECTIVE IMPAIRMENTS: Abnormal gait, decreased activity tolerance, decreased balance, decreased knowledge of use of DME, difficulty walking, decreased ROM, decreased strength, dizziness, hypomobility, impaired perceived functional ability, impaired flexibility, impaired sensation, improper body mechanics, postural dysfunction, and pain.   ACTIVITY LIMITATIONS: carrying, lifting, sitting, stairs, transfers, and locomotion level  PARTICIPATION LIMITATIONS: cleaning, laundry, driving, shopping, and community activity  PERSONAL FACTORS: Age, Social background, Time since onset of injury/illness/exacerbation, and 3+ comorbidities: see above  are also affecting patient's functional outcome.   REHAB POTENTIAL: Good  CLINICAL DECISION MAKING: Evolving/moderate complexity  EVALUATION COMPLEXITY: Moderate   GOALS: Goals reviewed with patient? Yes  SHORT TERM GOALS: Target date: 12/21/2021  Pt will be IND with initial HEP in order to indicate improved functional mobility and dec fall risk. Baseline: Goal status: MET  2.  Will assess FGA and improve score by 3 points from baseline  in order to indicate dec fall risk.  Baseline: 13/30 to 22/30 Goal status: MET  3.  Pt will report dec in pain from 8/10 to no more than 5/10 with functional activities.  Baseline: is 5/10 today but still increases from time to time Goal status: IN PROGRESS  4.  Pt will demonstrate improved lumbar/SIJ mobility in order to perform ADLs with less pain.  Baseline:  Goal status: MET  5.  Pt will improve DVA to within 2 lines of difference in order to demonstrate improved VOR. Baseline: 2 lines difference Goal status: MET    LONG TERM GOALS: Target date: 01/20/2022  Pt will be IND with final HEP in order to indicate improved functional mobility and dec fall risk. Baseline:  Goal status: INITIAL  2.  Pt will report no more than 3/10 pain with functional tasks.  Baseline:  Goal status: INITIAL  3.  Pt will improve FGA to >/=26 in order to indicate reduced fall risk.  Baseline: 13/30 Goal status: REVISED  4.  Pt will ambulate with gait speed of >/=2.62 ft/sec with less compensations in order to indicate safe community mobility.  Baseline: 2.32 ft/sec no AD Goal status: INITIAL  5.  Pt will ambulate x 500' over unlevel paved surfaces while scanning environment without overt LOB in order to indicate safe community mobility.   Baseline:  Goal status: INITIAL  6.  Pt will be able to cross R LE over LLE in order to indicate improved flexibility and ability to get dressed.  Baseline:  Goal status: INITIAL   PLAN: PT FREQUENCY: 2x/week  PT DURATION: 8 weeks  PLANNED INTERVENTIONS: Therapeutic exercises, Therapeutic activity, Neuromuscular re-education, Balance training, Gait training, Patient/Family education, Self Care, Joint mobilization, Stair training, Vestibular training, Canalith repositioning, DME instructions, Aquatic Therapy, Dry Needling, Spinal mobilization, Moist heat, Taping, Traction, and Manual therapy.  PLAN FOR NEXT SESSION:  Her pain is much better, work on hip  as needed but I would focus on balance at this time-eyes closed, did not add to HEP due to risk of falling at home w/ significant LOB noted in clinic. Gentle stretching/strengthening and add to HEP, gentle lumbar/SIJ mobs if she will tolerate.  Core and glute strength.     Cameron Sprang, PT, MPT [facility] [address] [place], [place], [address] Phone: [phone]   Fax:  [phone] 01/01/22, 2:31 PM

## 2022-01-03 ENCOUNTER — Ambulatory Visit: Payer: Medicare HMO | Admitting: Physical Therapy

## 2022-01-04 ENCOUNTER — Other Ambulatory Visit: Payer: Self-pay | Admitting: Student in an Organized Health Care Education/Training Program

## 2022-01-04 DIAGNOSIS — E039 Hypothyroidism, unspecified: Secondary | ICD-10-CM

## 2022-01-08 ENCOUNTER — Encounter: Payer: Self-pay | Admitting: Rehabilitation

## 2022-01-08 ENCOUNTER — Ambulatory Visit: Payer: Medicare HMO | Attending: Internal Medicine | Admitting: Rehabilitation

## 2022-01-08 DIAGNOSIS — R42 Dizziness and giddiness: Secondary | ICD-10-CM | POA: Diagnosis present

## 2022-01-08 DIAGNOSIS — R293 Abnormal posture: Secondary | ICD-10-CM | POA: Insufficient documentation

## 2022-01-08 DIAGNOSIS — M25551 Pain in right hip: Secondary | ICD-10-CM | POA: Insufficient documentation

## 2022-01-08 DIAGNOSIS — M6281 Muscle weakness (generalized): Secondary | ICD-10-CM | POA: Diagnosis present

## 2022-01-08 DIAGNOSIS — R2689 Other abnormalities of gait and mobility: Secondary | ICD-10-CM

## 2022-01-08 NOTE — Therapy (Signed)
OUTPATIENT PHYSICAL THERAPY THORACOLUMBAR TREATMENT   Patient Name: Andrea Lowery MRN: [id number] DOB:[birth year], 81 y.o., female Today's Date: 01/08/2022   PT End of Session - 01/08/22 1234     Visit Number 9    Number of Visits 17    Date for PT Re-Evaluation 01/20/22    Authorization Type [employer] medicare/medicaid (10th visit PN needed)    Progress Note Due on Visit 10    PT Start Time 1232    PT Stop Time 1315    PT Time Calculation (min) 43 min    Activity Tolerance Patient tolerated treatment well;Other (comment)   nausea   Behavior During Therapy [facility] for tasks assessed/performed             Past Medical History:  Diagnosis Date   Arthritis    Asthma    Asthma 01/10/2016   Last Assessment & Plan:  After recent ED eval- currrently utilizing prednisone as prescribed. Begin Levaquin, prednisone and Albuterol MDI to be utilized as needed for acute wheezing FU in 2 day is no improvement or worsening of symptoms. Pt verbalizes understanding for current treatment plan. Follow-up in one week. Plan to increase chronic inhaled steroid dose to assess further with prevention of   Diabetes mellitus without complication ([place])    Diabetes II   Difficulty sleeping 12/28/2013   Last Assessment & Plan:  Stable with trazodone nightly. Continue current treatment plan. Refill trazodone today. Tolerating medication well without side effects. Return in 3 months for follow up or sooner if any worsening of symptoms.    DOE (dyspnea on exertion) 07/01/2012   DVT (deep venous thrombosis) ([place])    "Years ago"   Dyslipidemia    Eczema    GERD (gastroesophageal reflux disease)    Headache    Migraine   Hypertension    Hypokalemia 04/30/2016   Hypothyroidism    LBBB (left bundle branch block)    MGUS (monoclonal gammopathy of unknown significance)    MGUS (monoclonal gammopathy of unknown significance)    Migraine    OSA on CPAP    Sinus infection    had sinus surgery   Past  Surgical History:  Procedure Laterality Date   ABDOMINAL HYSTERECTOMY  1969   ARTERY BIOPSY N/A 11/07/2014   Procedure: BIOPSY TEMPORAL ARTERY;  Surgeon: Leta Baptist, MD;  Location: [facility];  Service: ENT;  Laterality: N/A;   CARPAL TUNNEL RELEASE Right    CHOLECYSTECTOMY  1976   ENDOVENOUS ABLATION SAPHENOUS VEIN W/ LASER Left 08/24/2020   endovenous laser ablation left greater saphenous vein by Gae Gallop MD   LUMBAR LAMINECTOMY/DECOMPRESSION MICRODISCECTOMY N/A 07/31/2015   Procedure: L4-5 Decompression, Possible Right L4-5 Microdiscectomy;  Surgeon: Marybelle Killings, MD;  Location: [place] [facility];  Service: Orthopedics;  Laterality: N/A;   NASAL SINUS SURGERY     "years ago"   NM MYOVIEW LTD  02/13/2010   No ischemia   PLANTAR FASCIA RELEASE     TONSILLECTOMY  1965   US ECHOCARDIOGRAPHY  09/20/2008   borderline LVH,mild TR,AOV mildly sclerotic w/ca+ of the leaflets   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   Subacute cough 10/22/2021   Vitamin D insufficiency 10/15/2021   De Quervain's tenosynovitis, left 09/21/2021   Urinary frequency 07/25/2021  Protrusion of lumbar intervertebral disc 09/13/2019   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   Eczema 08/02/2016   Osteoporosis without current pathological fracture 05/30/2015   Chronic right-sided low back pain with right-sided sciatica 03/31/2015   Type 2 diabetes mellitus ([place]) 56/70/1410   Diastolic CHF ([place]) 30/13/1438   Sacroiliac dysfunction 11/04/2014   GERD (gastroesophageal reflux disease) 10/05/2014   Hypothyroidism 09/21/2014   Anxiety and depression 03/18/2014   Healthcare maintenance 01/25/2014   Hyperlipidemia 12/27/2013   Bilateral leg edema 07/20/2013   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/2013    Essential hypertension 07/01/2012    PCP: Charise Killian, MD  REFERRING PROVIDER: Charise Killian, MD   REFERRING DIAG: G57.01 (ICD-10-CM) - Piriformis syndrome of right side  R42 (ICD-10-CM) - Dizziness and giddiness  Rationale for Evaluation and Treatment Rehabilitation  THERAPY DIAG:  Other abnormalities of gait and mobility  Muscle weakness (generalized)  Abnormal posture  Dizziness and giddiness  ONSET DATE: Approx 2 weeks ago per patient report   SUBJECTIVE:                                                                                                                                                                                           SUBJECTIVE STATEMENT: Pt reports no changes since last visit, has some    PERTINENT HISTORY:  Trigeminal neuralgia of left side of face, well-controlled type 2 DM, hyperlipidemia, and hypertension   PAIN:  Are you having pain? Yes: NPRS scale: 6/10 Pain location: R SI down back of leg Pain description: achy  Aggravating factors: walking Relieving factors: getting up, nothing   PRECAUTIONS: Fall  WEIGHT BEARING RESTRICTIONS: No  FALLS:  Has patient fallen in last 6 months? Yes. Number of falls 2  LIVING ENVIRONMENT: Lives with: lives with their family Lives in: House/apartment Stairs: Yes: External: 1 steps; none Has following equipment at home: None  OCCUPATION: retired, pretty active in church   PLOF: [place]: "I want to be able to wear the shoes I want to wear and walk without any devices."    OBJECTIVE:  TODAY'S TREATMENT:  [facility] PT Treatment:  Warm up on seated scifit x 6 mins at level 4 resistance with BUEs/LEs with cues to keep rpms in 70-80's. Pt with moderate fatigue following task.    NMR:  In // bars on blue foam beam: standing perpendicularly with feet apart EO  alt cone taps x 20 reps with intermittent support as needed (trying to do without support).   She has more difficulty today remaining on LLE therefore tending to "fall" to the R.  Wall bumps while on blue foam beam approx 8" from wall x 15 reps>wall bumps with alt forward steps to floor x 15 reps.  Tandem stance on foam beam x 20 secs with cues for improved proximal mm activation on posterior LE for improved balance.   Resisted gait x 230' with continuous resistance but then PT adding random perturbations in varied directions.  Pt able to step and even cross midline with only one instance of true LOB needing min A to correct.    Side step squat with green theraband x 25' x 4 reps with cues for proper squat technique.       PATIENT EDUCATION:  Education details: Continue with exercises.   Person educated: Patient Education method: Explanation, Demonstration, Tactile cues, and Verbal cues Education comprehension: verbalized understanding, verbal cues required, and needs further education   HOME EXERCISE PROGRAM: Access Code: [email or web address] URL: [email or web address] Date: 12/25/2021 Prepared by: Elease Etienne  Exercises - Supine Bridge  - 1 x daily - 4-5 x weekly - 2 sets - 12 reps - Supine Single Knee to Chest  - 1 x daily - 5 x weekly - 1 sets - 3 reps - 30 seconds hold - Supine Figure 4 Piriformis Stretch  - 1 x daily - 5 x weekly - 3 sets - 10 reps - Seated Gaze Stabilization with Head Rotation  - 1 x daily - 3 x weekly - 3 sets - 10 reps - Seated Gaze Stabilization with Head Nod  - 1 x daily - 3 x weekly - 3 sets - 10 reps - Supine ITB Stretch with Strap  - 1 x daily - 7 x weekly - 1 sets - 3 reps - 30 secs hold - Standing ITB Stretch  - 1 x daily - 7 x weekly - 1 sets - 3 reps - 30 secs  hold - Brandt-Daroff Vestibular Exercise  - 2 x daily - 7 x weekly - 1 sets - 5 reps - Tandem Walking with Counter Support  - 1 x daily - 7 x weekly - 3 sets - 10 reps - Backward Tandem  Walking with Counter Support  - 1 x daily - 7 x weekly - 3 sets - 10 reps  ASSESSMENT:  CLINICAL IMPRESSION: Skilled session focused on dynamic balance with gait with continued focus on stepping and hip strategy.  She continues to make good progress but is mainly limited by poor endurance.     OBJECTIVE IMPAIRMENTS: Abnormal gait, decreased activity tolerance, decreased balance, decreased knowledge of use of DME, difficulty walking, decreased ROM, decreased strength, dizziness, hypomobility, impaired perceived functional ability, impaired flexibility, impaired sensation, improper body mechanics, postural dysfunction, and pain.   ACTIVITY LIMITATIONS: carrying, lifting, sitting, stairs, transfers, and locomotion level  PARTICIPATION LIMITATIONS: cleaning, laundry, driving, shopping, and community activity  PERSONAL FACTORS: Age, Social background, Time since onset of injury/illness/exacerbation, and 3+ comorbidities: see above  are also affecting patient's functional outcome.   REHAB POTENTIAL: Good  CLINICAL DECISION MAKING: Evolving/moderate complexity  EVALUATION COMPLEXITY: Moderate  GOALS: Goals reviewed with patient? Yes  SHORT TERM GOALS: Target date: 12/21/2021  Pt will be IND with initial HEP in order to indicate improved functional mobility and dec fall risk. Baseline: Goal status: MET  2.  Will assess FGA and improve score by 3 points from baseline in order to indicate dec fall risk.  Baseline: 13/30 to 22/30 Goal status: MET  3.  Pt will report dec in pain from 8/10 to no more than 5/10 with functional activities.  Baseline: is 5/10 today but still increases from time to time Goal status: IN PROGRESS  4.  Pt will demonstrate improved lumbar/SIJ mobility in order to perform ADLs with less pain.  Baseline:  Goal status: MET  5.  Pt will improve DVA to within 2 lines of difference in order to demonstrate improved VOR. Baseline: 2 lines difference Goal status:  MET    LONG TERM GOALS: Target date: 01/20/2022  Pt will be IND with final HEP in order to indicate improved functional mobility and dec fall risk. Baseline:  Goal status: INITIAL  2.  Pt will report no more than 3/10 pain with functional tasks.  Baseline:  Goal status: INITIAL  3.  Pt will improve FGA to >/=26 in order to indicate reduced fall risk.  Baseline: 13/30 Goal status: REVISED  4.  Pt will ambulate with gait speed of >/=2.62 ft/sec with less compensations in order to indicate safe community mobility.  Baseline: 2.32 ft/sec no AD Goal status: INITIAL  5.  Pt will ambulate x 500' over unlevel paved surfaces while scanning environment without overt LOB in order to indicate safe community mobility.   Baseline:  Goal status: INITIAL  6.  Pt will be able to cross R LE over LLE in order to indicate improved flexibility and ability to get dressed.  Baseline:  Goal status: INITIAL   PLAN: PT FREQUENCY: 2x/week  PT DURATION: 8 weeks  PLANNED INTERVENTIONS: Therapeutic exercises, Therapeutic activity, Neuromuscular re-education, Balance training, Gait training, Patient/Family education, Self Care, Joint mobilization, Stair training, Vestibular training, Canalith repositioning, DME instructions, Aquatic Therapy, Dry Needling, Spinal mobilization, Moist heat, Taping, Traction, and Manual therapy.  PLAN FOR NEXT SESSION:  Planning to wrap up next week. Her pain is much better, work on hip as needed but I would focus on balance at this time-eyes closed, did not add to HEP due to risk of falling at home w/ significant LOB noted in clinic. Gentle stretching/strengthening and add to HEP, gentle lumbar/SIJ mobs if she will tolerate.  Core and glute strength.     Cameron Sprang, PT, MPT [facility] [address] [place], [place], [address] Phone: [phone]   Fax:  [phone] 01/08/22, 3:04 PM

## 2022-01-10 ENCOUNTER — Ambulatory Visit: Payer: Medicare HMO | Admitting: Rehabilitation

## 2022-01-11 ENCOUNTER — Other Ambulatory Visit: Payer: Self-pay | Admitting: Student

## 2022-01-11 DIAGNOSIS — F4329 Adjustment disorder with other symptoms: Secondary | ICD-10-CM

## 2022-01-14 ENCOUNTER — Telehealth: Payer: Self-pay | Admitting: *Deleted

## 2022-01-14 NOTE — Telephone Encounter (Signed)
Tried to call patient to advise appt needed for [facility] but unable to leave message

## 2022-01-15 ENCOUNTER — Ambulatory Visit: Payer: Medicare HMO | Admitting: Rehabilitation

## 2022-01-15 ENCOUNTER — Ambulatory Visit (INDEPENDENT_AMBULATORY_CARE_PROVIDER_SITE_OTHER): Payer: Medicare HMO

## 2022-01-15 ENCOUNTER — Encounter: Payer: Self-pay | Admitting: Rehabilitation

## 2022-01-15 ENCOUNTER — Other Ambulatory Visit: Payer: Self-pay | Admitting: Internal Medicine

## 2022-01-15 DIAGNOSIS — R293 Abnormal posture: Secondary | ICD-10-CM

## 2022-01-15 DIAGNOSIS — T63441D Toxic effect of venom of bees, accidental (unintentional), subsequent encounter: Secondary | ICD-10-CM | POA: Diagnosis not present

## 2022-01-15 DIAGNOSIS — R42 Dizziness and giddiness: Secondary | ICD-10-CM

## 2022-01-15 DIAGNOSIS — R2689 Other abnormalities of gait and mobility: Secondary | ICD-10-CM

## 2022-01-15 DIAGNOSIS — M25551 Pain in right hip: Secondary | ICD-10-CM

## 2022-01-15 DIAGNOSIS — I10 Essential (primary) hypertension: Secondary | ICD-10-CM

## 2022-01-15 NOTE — Therapy (Signed)
OUTPATIENT PHYSICAL THERAPY THORACOLUMBAR TREATMENT/DISCHARGE SUMMARY   Patient Name: Andrea Lowery MRN: [id number] DOB:Oct 05, 1940, 81 y.o., female Today's Date: 01/15/2022   PT End of Session - 01/15/22 1233     Visit Number 10    Number of Visits 17    Date for PT Re-Evaluation 01/20/22    Authorization Type [employer] medicare/medicaid (10th visit PN needed)    Progress Note Due on Visit 10    PT Start Time 1230    PT Stop Time 1308    PT Time Calculation (min) 38 min    Activity Tolerance Patient tolerated treatment well;Other (comment)   nausea   Behavior During Therapy [facility] for tasks assessed/performed             Past Medical History:  Diagnosis Date   Arthritis    Asthma    Asthma 01/10/2016   Last Assessment & Plan:  After recent ED eval- currrently utilizing prednisone as prescribed. Begin Levaquin, prednisone and Albuterol MDI to be utilized as needed for acute wheezing FU in 2 day is no improvement or worsening of symptoms. Pt verbalizes understanding for current treatment plan. Follow-up in one week. Plan to increase chronic inhaled steroid dose to assess further with prevention of   Diabetes mellitus without complication ([place])    Diabetes II   Difficulty sleeping 12/28/2013   Last Assessment & Plan:  Stable with trazodone nightly. Continue current treatment plan. Refill trazodone today. Tolerating medication well without side effects. Return in 3 months for follow up or sooner if any worsening of symptoms.    DOE (dyspnea on exertion) 07/01/2012   DVT (deep venous thrombosis) ([place])    "Years ago"   Dyslipidemia    Eczema    GERD (gastroesophageal reflux disease)    Headache    Migraine   Hypertension    Hypokalemia 04/30/2016   Hypothyroidism    LBBB (left bundle branch block)    MGUS (monoclonal gammopathy of unknown significance)    MGUS (monoclonal gammopathy of unknown significance)    Migraine    OSA on CPAP    Sinus infection    had sinus  surgery   Past Surgical History:  Procedure Laterality Date   ABDOMINAL HYSTERECTOMY  1969   ARTERY BIOPSY N/A 11/07/2014   Procedure: BIOPSY TEMPORAL ARTERY;  Surgeon: Leta Baptist, MD;  Location: [facility];  Service: ENT;  Laterality: N/A;   CARPAL TUNNEL RELEASE Right    CHOLECYSTECTOMY  1976   ENDOVENOUS ABLATION SAPHENOUS VEIN W/ LASER Left 08/24/2020   endovenous laser ablation left greater saphenous vein by Gae Gallop MD   LUMBAR LAMINECTOMY/DECOMPRESSION MICRODISCECTOMY N/A 07/31/2015   Procedure: L4-5 Decompression, Possible Right L4-5 Microdiscectomy;  Surgeon: Marybelle Killings, MD;  Location: [facility];  Service: Orthopedics;  Laterality: N/A;   NASAL SINUS SURGERY     "years ago"   NM MYOVIEW LTD  02/13/2010   No ischemia   PLANTAR FASCIA RELEASE     TONSILLECTOMY  1965   US ECHOCARDIOGRAPHY  09/20/2008   borderline LVH,mild TR,AOV mildly sclerotic w/ca+ of the leaflets   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   Subacute cough 10/22/2021   Vitamin D insufficiency 10/15/2021   De Quervain's tenosynovitis, left 09/21/2021   Urinary frequency 07/25/2021  Protrusion of lumbar intervertebral disc 09/13/2019   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   Eczema 08/02/2016   Osteoporosis without current pathological fracture 05/30/2015   Chronic right-sided low back pain with right-sided sciatica 03/31/2015   Type 2 diabetes mellitus ([place]) 89/16/9450   Diastolic CHF ([place]) 38/88/2800   Sacroiliac dysfunction 11/04/2014   GERD (gastroesophageal reflux disease) 10/05/2014   Hypothyroidism 09/21/2014   Anxiety and depression 03/18/2014   Healthcare maintenance 01/25/2014   Hyperlipidemia 12/27/2013   Bilateral leg edema 07/20/2013   MGUS (monoclonal gammopathy of unknown  significance) 05/20/2013   Essential hypertension 07/01/2012    PCP: Charise Killian, MD  REFERRING PROVIDER: Charise Killian, MD   REFERRING DIAG: G57.01 (ICD-10-CM) - Piriformis syndrome of right side  R42 (ICD-10-CM) - Dizziness and giddiness  Rationale for Evaluation and Treatment Rehabilitation  THERAPY DIAG:  Other abnormalities of gait and mobility  Abnormal posture  Pain in right hip  Dizziness and giddiness  ONSET DATE: Approx 2 weeks ago per patient report   SUBJECTIVE:                                                                                                                                                                                           SUBJECTIVE STATEMENT: Pt reports no changes since last visit, has some more hip pain today, attributes it to the weather.    PERTINENT HISTORY:  Trigeminal neuralgia of left side of face, well-controlled type 2 DM, hyperlipidemia, and hypertension   PAIN:  Are you having pain? Yes: NPRS scale: 6/10 Pain location: R hip and side of leg Pain description: achy  Aggravating factors: walking Relieving factors: getting up, nothing   PRECAUTIONS: Fall  WEIGHT BEARING RESTRICTIONS: No  FALLS:  Has patient fallen in last 6 months? Yes. Number of falls 2  LIVING ENVIRONMENT: Lives with: lives with their family Lives in: House/apartment Stairs: Yes: External: 1 steps; none Has following equipment at home: None  OCCUPATION: retired, pretty active in church   PLOF: [place]: "I want to be able to wear the shoes I want to wear and walk without any devices."    OBJECTIVE:  TODAY'S TREATMENT:  [facility]:  Gait speed: 3.02 ft/sec without device     [facility] PT Assessment - 01/15/22 1243       Functional Gait  Assessment   Gait assessed  Yes    Gait Level Surface  Walks 20 ft in less than 5.5 sec, no assistive devices, good speed, no evidence for imbalance, normal gait pattern, deviates no more than 6 in outside of the 12 in walkway width.    Change in Gait Speed Able to smoothly change walking speed without loss of balance or gait deviation. Deviate no more than 6 in outside of the 12 in walkway width.    Gait with Horizontal Head Turns Performs head turns smoothly with no change in gait. Deviates no more than 6 in outside 12 in walkway width    Gait with Vertical Head Turns Performs head turns with no change in gait. Deviates no more than 6 in outside 12 in walkway width.    Gait and Pivot Turn Pivot turns safely within 3 sec and stops quickly with no loss of balance.    Step Over Obstacle Is able to step over 2 stacked shoe boxes taped together (9 in total height) without changing gait speed. No evidence of imbalance.    Gait with Narrow Base of Support Ambulates 7-9 steps.    Gait with Eyes Closed Walks 20 ft, uses assistive device, slower speed, mild gait deviations, deviates 6-10 in outside 12 in walkway width. Ambulates 20 ft in less than 9 sec but greater than 7 sec.    Ambulating Backwards Walks 20 ft, no assistive devices, good speed, no evidence for imbalance, normal gait    Steps Alternating feet, must use rail.    Total Score 27    FGA comment: 25-28 = low risk fall            PATIENT EDUCATION:  Education details: Progress with goals.   Person educated: Patient Education method: Explanation, Demonstration, Tactile cues, and Verbal cues Education comprehension: verbalized understanding, verbal cues required, and needs further education   HOME EXERCISE PROGRAM: Access Code: [email or web address] URL: [email or web address] Date: 01/15/2022 Prepared by: Cameron Sprang  Exercises - Supine Bridge  - 1 x daily - 4-5 x weekly - 2 sets - 12 reps - Supine Single Knee to Chest  - 1 x daily - 5 x weekly - 1 sets - 3 reps - 30 seconds hold -  Supine Figure 4 Piriformis Stretch  - 1 x daily - 5 x weekly - 3 sets - 10 reps - Seated Gaze Stabilization with Head Rotation  - 1 x daily - 3 x weekly - 3 sets - 10 reps - Seated Gaze Stabilization with Head Nod  - 1 x daily - 3 x weekly - 3 sets - 10 reps - Supine ITB Stretch with Strap  - 1 x daily - 7 x weekly - 1 sets - 3 reps - 30 secs hold - Standing ITB Stretch  - 1 x daily - 7 x weekly - 1 sets - 3 reps - 30 secs  hold - Brandt-Daroff Vestibular Exercise  - 2 x daily - 7 x weekly - 1 sets - 5 reps - Tandem Walking with Counter Support  - 1 x daily - 7 x weekly - 3 sets - 10 reps - Backward Tandem Walking with Counter Support  - 1 x daily - 7 x weekly - 3 sets - 10 reps - Romberg Stance Eyes Closed on Foam Pad  - 1 x daily -  7 x weekly - 1 sets - 3 reps - 20 secs hold - Romberg Stance on Foam Pad with Head Rotation  - 1 x daily - 7 x weekly - 1 sets - 10 reps - Romberg Stance with Head Nods on Foam Pad  - 1 x daily - 7 x weekly - 1 sets - 10 reps  Performed bolded exercises.  Kept VOR and Nestor Lewandowsky in case she needs them in future.  Also kept stretches as she still has some intermittent hip/back pain.    PHYSICAL THERAPY DISCHARGE SUMMARY  Visits from Start of Care: 10  Current functional level related to goals / functional outcomes: See goals above   Remaining deficits: Continues to have high level balance deficits and intermittent hip pain but overall is doing very well and has HEP to address remaining deficits.    Education / Equipment: HEP    Patient agrees to discharge. Patient goals were partially met. Patient is being discharged due to meeting the stated rehab goals.   ASSESSMENT:  CLINICAL IMPRESSION: Skilled session focused on addressing LTGs due to conflict in schedule for Thursday.  She has met 3/6 LTGs, partially meeting goal for pain, depending on the weather.  Pt agrees with D/C at this time.     OBJECTIVE IMPAIRMENTS: Abnormal gait, decreased activity  tolerance, decreased balance, decreased knowledge of use of DME, difficulty walking, decreased ROM, decreased strength, dizziness, hypomobility, impaired perceived functional ability, impaired flexibility, impaired sensation, improper body mechanics, postural dysfunction, and pain.   ACTIVITY LIMITATIONS: carrying, lifting, sitting, stairs, transfers, and locomotion level  PARTICIPATION LIMITATIONS: cleaning, laundry, driving, shopping, and community activity  PERSONAL FACTORS: Age, Social background, Time since onset of injury/illness/exacerbation, and 3+ comorbidities: see above  are also affecting patient's functional outcome.   REHAB POTENTIAL: Good  CLINICAL DECISION MAKING: Evolving/moderate complexity  EVALUATION COMPLEXITY: Moderate   GOALS: Goals reviewed with patient? Yes  SHORT TERM GOALS: Target date: 12/21/2021  Pt will be IND with initial HEP in order to indicate improved functional mobility and dec fall risk. Baseline: Goal status: MET  2.  Will assess FGA and improve score by 3 points from baseline in order to indicate dec fall risk.  Baseline: 13/30 to 22/30 Goal status: MET  3.  Pt will report dec in pain from 8/10 to no more than 5/10 with functional activities.  Baseline: is 5/10 today but still increases from time to time Goal status: IN PROGRESS  4.  Pt will demonstrate improved lumbar/SIJ mobility in order to perform ADLs with less pain.  Baseline:  Goal status: MET  5.  Pt will improve DVA to within 2 lines of difference in order to demonstrate improved VOR. Baseline: 2 lines difference Goal status: MET    LONG TERM GOALS: Target date: 01/20/2022  Pt will be IND with final HEP in order to indicate improved functional mobility and dec fall risk. Baseline: Met per pt report, updated some today Goal status: MET  2.  Pt will report no more than 3/10 pain with functional tasks.  Baseline: Reports is true most of the time, does increase some with  colder weather Goal status: IN PROGRESS  3.  Pt will improve FGA to >/=26 in order to indicate reduced fall risk.  Baseline: 27/30 Goal status: MET  4.  Pt will ambulate with gait speed of >/=2.62 ft/sec with less compensations in order to indicate safe community mobility.  Baseline: 3.05 ft/sec  Goal status: INITIAL  5.  Pt  will ambulate x 500' over unlevel paved surfaces while scanning environment without overt LOB in order to indicate safe community mobility.   Baseline:  Goal status: Did not assess due to weather  6.  Pt will be able to cross R LE over LLE in order to indicate improved flexibility and ability to get dressed.  Baseline:  Goal status: MET   PLAN: PT FREQUENCY: 2x/week  PT DURATION: 8 weeks  PLANNED INTERVENTIONS: Therapeutic exercises, Therapeutic activity, Neuromuscular re-education, Balance training, Gait training, Patient/Family education, Self Care, Joint mobilization, Stair training, Vestibular training, Canalith repositioning, DME instructions, Aquatic Therapy, Dry Needling, Spinal mobilization, Moist heat, Taping, Traction, and Manual therapy.  PLAN FOR NEXT SESSION:  DC   Cameron Sprang, PT, MPT [facility] [address] [place], [place], [address] Phone: [phone]   Fax:  [phone] 01/15/22, 1:11 PM

## 2022-01-16 ENCOUNTER — Other Ambulatory Visit: Payer: Self-pay | Admitting: *Deleted

## 2022-01-16 DIAGNOSIS — I10 Essential (primary) hypertension: Secondary | ICD-10-CM

## 2022-01-16 MED ORDER — SPIRONOLACTONE 25 MG PO TABS: 50.0000 mg | ORAL_TABLET | Freq: Every day | ORAL | 0 refills | Status: DC

## 2022-01-16 MED ORDER — EZETIMIBE 10 MG PO TABS: 10.0000 mg | ORAL_TABLET | Freq: Every day | ORAL | 11 refills | Status: DC

## 2022-01-16 MED ORDER — CARBAMAZEPINE ER 200 MG PO TB12: 200.0000 mg | ORAL_TABLET | Freq: Every day | ORAL | 3 refills | Status: DC

## 2022-01-16 NOTE — Telephone Encounter (Signed)
Different dose on Toprolol XL.

## 2022-01-17 ENCOUNTER — Ambulatory Visit: Payer: Medicare HMO | Admitting: Rehabilitation

## 2022-01-18 NOTE — Telephone Encounter (Signed)
Called pt who stated she takes 50 mg of Metoprolol.

## 2022-01-31 ENCOUNTER — Ambulatory Visit: Payer: Medicare HMO

## 2022-02-05 ENCOUNTER — Ambulatory Visit (INDEPENDENT_AMBULATORY_CARE_PROVIDER_SITE_OTHER): Payer: Medicare HMO

## 2022-02-05 DIAGNOSIS — J4550 Severe persistent asthma, uncomplicated: Secondary | ICD-10-CM

## 2022-02-07 ENCOUNTER — Encounter: Payer: Self-pay | Admitting: Family Medicine

## 2022-02-07 ENCOUNTER — Ambulatory Visit
Admission: RE | Admit: 2022-02-07 | Discharge: 2022-02-07 | Disposition: A | Discharge status: Home / Self Care | Payer: Medicare HMO | Source: Ambulatory Visit | Attending: Family Medicine | Admitting: Family Medicine

## 2022-02-07 ENCOUNTER — Other Ambulatory Visit: Payer: Self-pay

## 2022-02-07 ENCOUNTER — Other Ambulatory Visit: Payer: Self-pay | Admitting: *Deleted

## 2022-02-07 ENCOUNTER — Ambulatory Visit (INDEPENDENT_AMBULATORY_CARE_PROVIDER_SITE_OTHER): Payer: Medicare HMO | Admitting: Family Medicine

## 2022-02-07 VITALS — Temp 98.0°F | Resp 16 | Ht 67.0 in | Wt 190.4 lb

## 2022-02-07 DIAGNOSIS — R059 Cough, unspecified: Secondary | ICD-10-CM

## 2022-02-07 DIAGNOSIS — T63441A Toxic effect of venom of bees, accidental (unintentional), initial encounter: Secondary | ICD-10-CM | POA: Insufficient documentation

## 2022-02-07 DIAGNOSIS — J4551 Severe persistent asthma with (acute) exacerbation: Secondary | ICD-10-CM

## 2022-02-07 DIAGNOSIS — K219 Gastro-esophageal reflux disease without esophagitis: Secondary | ICD-10-CM

## 2022-02-07 DIAGNOSIS — J3089 Other allergic rhinitis: Secondary | ICD-10-CM

## 2022-02-07 DIAGNOSIS — J302 Other seasonal allergic rhinitis: Secondary | ICD-10-CM

## 2022-02-07 DIAGNOSIS — Z9189 Other specified personal risk factors, not elsewhere classified: Secondary | ICD-10-CM | POA: Diagnosis not present

## 2022-02-07 DIAGNOSIS — T63441D Toxic effect of venom of bees, accidental (unintentional), subsequent encounter: Secondary | ICD-10-CM

## 2022-02-07 MED ORDER — EPINEPHRINE 0.3 MG/0.3ML IJ SOAJ: INTRAMUSCULAR | 1 refills | Status: DC

## 2022-02-07 MED ORDER — FLUTICASONE PROPIONATE 50 MCG/ACT NA SUSP: 2.0000 | Freq: Every day | NASAL | 1 refills | Status: DC

## 2022-02-07 MED ORDER — ALBUTEROL SULFATE HFA 108 (90 BASE) MCG/ACT IN AERS: 2.0000 | INHALATION_SPRAY | RESPIRATORY_TRACT | 1 refills | Status: DC | PRN

## 2022-02-07 MED ORDER — BUDESONIDE-FORMOTEROL FUMARATE 160-4.5 MCG/ACT IN AERO: INHALATION_SPRAY | RESPIRATORY_TRACT | 1 refills | Status: DC

## 2022-02-07 MED ORDER — PREDNISONE 10 MG PO TABS: ORAL_TABLET | ORAL | 0 refills | Status: DC

## 2022-02-07 MED ORDER — MONTELUKAST SODIUM 10 MG PO TABS: 10.0000 mg | ORAL_TABLET | Freq: Every evening | ORAL | 1 refills | Status: DC

## 2022-02-07 MED ORDER — DOXYCYCLINE MONOHYDRATE 100 MG PO TABS: 100.0000 mg | ORAL_TABLET | Freq: Two times a day (BID) | ORAL | 0 refills | Status: AC

## 2022-02-07 NOTE — Progress Notes (Addendum)
[place] [address] Dept: [phone]  FOLLOW UP NOTE  Patient ID: Andrea Lowery, female    DOB: 16-Oct-1940  Age: 82 y.o. MRN: [id number] Date of Office Visit: 02/07/2022  Assessment  Chief Complaint: Follow-up, Cough, Wheezing, and Shortness of Breath  HPI Andrea Lowery is a 82 year old female who presents to the clinic for follow-up visit.  She was last seen in this clinic on 02/06/2021 by Dr. Neldon Mc for evaluation of asthma on biologic therapy, allergic rhinitis on allergen immunotherapy reflux, and hymenoptera allergy on venom therapy.  At today's visit, she reports that she began to develop dry cough, shortness of breath, and wheeze that began 2 days ago.  She denies fever, however, reports that she has experienced sweats and chills over the last 2 days.  She reports that her sister overrides 2 puffs daily and the bus driver was recently diagnosed with COVID.  She reports that she has used albuterol a few times over the last 2 days with no relief of symptoms.  She reports that she has used Symbicort over the last couple of days, however, she is currently out of this medication, furthermore this medication is not listed in her current medication list.  Allergic rhinitis is reported as poorly controlled with symptoms including thick clear rhinorrhea and copious postnasal drainage.  She continues montelukast 10 mg once a day and is using Flonase as needed.  She continues allergen immunotherapy directed toward dust mite and tree.  Reflux is reported as well-controlled with no symptoms including heartburn or vomiting.  She continues esomeprazole daily.  She reports that she has not had any contact with stinging insects over the last several years.  She continues venom immunotherapy once every 8 weeks with no large or local reactions.  Her current medications are listed in the chart.  Drug Allergies:  Allergies  Allergen Reactions   Bee Venom Anaphylaxis    Yellow  jackets Yellow jackets   Penicillins Hives    Has patient had a PCN reaction causing immediate rash, facial/tongue/throat swelling, SOB or lightheadedness with hypotension: Yes Has patient had a PCN reaction causing severe rash involving mucus membranes or skin necrosis: No Has patient had a PCN reaction that required hospitalization No Has patient had a PCN reaction occurring within the last 10 years: No If all of the above answers are "NO", then may proceed with Cephalosporin use.     Physical Exam: Temp 98 F (36.7 C) (Temporal)   Resp 16   Ht '5\' 7"'$  (1.702 m)   Wt 190 lb 6.4 oz (86.4 kg)   SpO2 98%   BMI 29.82 kg/m    Physical Exam Vitals reviewed.  Constitutional:      Appearance: Normal appearance. She is well-developed.  HENT:     Head: Normocephalic and atraumatic.     Right Ear: Tympanic membrane normal.     Left Ear: Tympanic membrane normal.     Nose:     Comments: Bilateral naris edematous and pale with clear nasal drainage noted.  Pharynx slightly erythematous with no exudate.  Ears normal.  Eyes normal. Eyes:     Conjunctiva/sclera: Conjunctivae normal.  Cardiovascular:     Rate and Rhythm: Normal rate and regular rhythm.     Heart sounds: Normal heart sounds. No murmur heard. Pulmonary:     Effort: Pulmonary effort is normal.     Breath sounds: Normal breath sounds.     Comments: Slight crackles bilateral lower lobes.  No wheezing.  No rhonchi Musculoskeletal:        General: Normal range of motion.     Cervical back: Normal range of motion and neck supple.  Skin:    General: Skin is warm and dry.  Neurological:     Mental Status: She is alert and oriented to person, place, and time.  Psychiatric:        Mood and Affect: Mood normal.        Behavior: Behavior normal.        Thought Content: Thought content normal.        Judgment: Judgment normal.     Diagnostics: Rapid COVID testing negative in the clinic  Assessment and Plan: 1. Cough,  unspecified type   2. Severe persistent asthma with acute exacerbation   3. Seasonal and perennial allergic rhinitis   4. LPRD (laryngopharyngeal reflux disease)   5. Toxic effect of venom of bees, unintentional, subsequent encounter   6. At increased risk of exposure to COVID-19 virus     Meds ordered this encounter  Medications   albuterol (VENTOLIN HFA) 108 (90 Base) MCG/ACT inhaler    Sig: Inhale 2 puffs into the lungs every 4 (four) hours as needed for wheezing or shortness of breath.    Dispense:  54 g    Refill:  1   EPINEPHrine 0.3 mg/0.3 mL IJ SOAJ injection    Sig: Use as directed for severe allergic reaction    Dispense:  3 each    Refill:  1    Dispense MYLAN ONLY if generic is preferred   fluticasone (FLONASE) 50 MCG/ACT nasal spray    Sig: Place 2 sprays into both nostrils daily.    Dispense:  48 g    Refill:  1   montelukast (SINGULAIR) 10 MG tablet    Sig: Take 1 tablet (10 mg total) by mouth at bedtime.    Dispense:  90 tablet    Refill:  1   budesonide-formoterol (SYMBICORT) 160-4.5 MCG/ACT inhaler    Sig: INHALE 2 PUFFS BY MOUTH INTO THE LUNGS TWICE DAILY RINSE GARGLE AND SPIT AFTER USE    Dispense:  30.6 g    Refill:  1    Patient Instructions   1. Continue Fluticasone 1-2 sprays each nostril two times per day  2. Continue montelukast '10mg'$  one tablet one time per day  3. Continue esomeprazole 40 mg - 1 tablet 1 time per day  4. Continue immunotherapy for venoms and aeroallergens  5. Continue benralizumab injections every 8 weeks  6. If Needed:     A. nasal saline multiple times per day   B. OTC mucinex DM twice a day   C. Cetirizine '10mg'$  one time per day  D. Proair HFA 2 inhalations or DUONEB every 4-6 hours  E. EpiPen  7. For this recent episode:   A. Get a chest xray. We will call you when the results become available  B. COVID testing in the clinic was negative. Continue to mask/isolate until your symptoms have resolved  8. Return to  clinic in 1 month or earlier if problem  Return in about 4 weeks (around 03/07/2022), or if symptoms worsen or fail to improve.    Thank you for the opportunity to care for this patient.  Please do not hesitate to contact me with questions.  Gareth Morgan, FNP [facility]

## 2022-02-07 NOTE — Progress Notes (Signed)
Can you please call this patient and let her know that her chest xray was likely positive for pneumonia. Please order Doxycycline 100 mg BID for 10 days, Symbicort 160-2 puffs twice a day, and have her get a follow up chest xray in about 4-6 weeks. Thank you

## 2022-02-07 NOTE — Patient Instructions (Addendum)
  1. Continue Fluticasone 1-2 sprays each nostril two times per day  2. Continue montelukast '10mg'$  one tablet one time per day  3. Continue esomeprazole 40 mg - 1 tablet 1 time per day  4. Continue immunotherapy for venoms and aeroallergens  5. Continue benralizumab injections every 8 weeks  6. If Needed:     A. nasal saline multiple times per day   B. OTC mucinex DM twice a day   C. Cetirizine '10mg'$  one time per day  D. Proair HFA 2 inhalations or DUONEB every 4-6 hours  E. EpiPen  7. For this recent episode:   A. Get a chest xray. We will call you when the results become available  B. COVID testing in the clinic was negative. Continue to mask/isolate until your symptoms have resolved  8. Return to clinic in 1 month or earlier if problem

## 2022-02-14 ENCOUNTER — Telehealth: Payer: Self-pay | Admitting: Family Medicine

## 2022-02-14 ENCOUNTER — Ambulatory Visit: Payer: Medicare HMO | Admitting: Family Medicine

## 2022-02-14 MED ORDER — BENZONATATE 100 MG PO CAPS: 100.0000 mg | ORAL_CAPSULE | Freq: Three times a day (TID) | ORAL | 0 refills | Status: DC | PRN

## 2022-02-14 NOTE — Telephone Encounter (Signed)
Patient called and said that she is still sick, she is coughing and can not sleep because it is so bad. Still having hot and cold , and wahated to know if you would call in something for her cough. [facility] . [phone]

## 2022-02-14 NOTE — Telephone Encounter (Signed)
Can you please have her get testing for influenza? Please call in tessalon perles 100 mg tid for cough. Please have her make an appointment in the clinic in the morning. Thank you

## 2022-02-14 NOTE — Telephone Encounter (Signed)
Prescription sent into the requested pharmacy. Patient advised of Anne's instructions. Advised patient to let the office know if she comes back positive with the flu. Patient is being scheduled for an appointment for the morning. She stated that she understood and would make it to the appointment in the morning.

## 2022-02-15 ENCOUNTER — Ambulatory Visit: Payer: Medicare HMO | Admitting: Internal Medicine

## 2022-03-01 ENCOUNTER — Ambulatory Visit (INDEPENDENT_AMBULATORY_CARE_PROVIDER_SITE_OTHER): Payer: Medicare HMO

## 2022-03-01 VITALS — BP 129/63 | HR 73 | Temp 98.2°F | Ht 67.0 in | Wt 192.2 lb

## 2022-03-01 DIAGNOSIS — I110 Hypertensive heart disease with heart failure: Secondary | ICD-10-CM

## 2022-03-01 DIAGNOSIS — I5030 Unspecified diastolic (congestive) heart failure: Secondary | ICD-10-CM | POA: Diagnosis not present

## 2022-03-01 DIAGNOSIS — E119 Type 2 diabetes mellitus without complications: Secondary | ICD-10-CM

## 2022-03-01 DIAGNOSIS — Z87891 Personal history of nicotine dependence: Secondary | ICD-10-CM

## 2022-03-01 DIAGNOSIS — R051 Acute cough: Secondary | ICD-10-CM

## 2022-03-01 DIAGNOSIS — I10 Essential (primary) hypertension: Secondary | ICD-10-CM

## 2022-03-01 DIAGNOSIS — E785 Hyperlipidemia, unspecified: Secondary | ICD-10-CM

## 2022-03-01 LAB — POCT GLYCOSYLATED HEMOGLOBIN (HGB A1C): Hemoglobin A1C: 8.2 % — AB (ref 4.0–5.6)

## 2022-03-01 LAB — GLUCOSE, CAPILLARY: Glucose-Capillary: 138 mg/dL — ABNORMAL HIGH (ref 70–99)

## 2022-03-01 MED ORDER — PREDNISONE 10 MG PO TABS: ORAL_TABLET | ORAL | 0 refills | Status: DC

## 2022-03-01 MED ORDER — SPIRONOLACTONE 25 MG PO TABS: 50.0000 mg | ORAL_TABLET | Freq: Every day | ORAL | 0 refills | Status: DC

## 2022-03-01 MED ORDER — LEVOFLOXACIN 750 MG PO TABS: 750.0000 mg | ORAL_TABLET | Freq: Every day | ORAL | 0 refills | Status: DC

## 2022-03-01 MED ORDER — AZITHROMYCIN 500 MG PO TABS: 500.0000 mg | ORAL_TABLET | Freq: Every day | ORAL | 0 refills | Status: DC

## 2022-03-01 MED ORDER — AZITHROMYCIN 500 MG PO TABS: 500.0000 mg | ORAL_TABLET | Freq: Every day | ORAL | 0 refills | Status: AC

## 2022-03-01 NOTE — Assessment & Plan Note (Signed)
Patient presents w/ fever, chills, productive cough, ST, nasal congestion, and wheezing for the last 3 weeks. Patient denies facial pain, n/v/d, abdominal pain, HA, dizziness, or lightheadedness. Patient reports they are eat and drink appropriately. Patient was seen by her allergist on 02/07/2022. CXR from that day demonstrated subtle left infrahilar heterogeneous opacity on frontal view that may represent atelectasis vs early pneumonia and hyperinflation. Covid testing negative. She was given prednisone 10 mg BID (5 day taper) and discussed OTC mucinex, tessalon perles, netti pot, cetirizine 10 mg daily, fluticasone, albuterol, singulair, symbicort. She reports no symptom improvement and is having to use her inhalers more frequently.   Plan: - Start Levofloxacin (750 mg once daily for 5 days) - Start Azithromycin (500 mg once daily for 3 days) - Start prednisone taper 10 mg (40 mg for 3 days, 20 mg for 1 day, 10 mg for 1 day)

## 2022-03-01 NOTE — Assessment & Plan Note (Addendum)
Current medications include empagliflozin 10 mg daily. Patient states that they are compliant with this medication. Patient does not check their blood sugar at home regularly. Patient denies polyuria, polydipsia, fatigue. Patient states that they do visit the ophthalmologist for yearly eye exams. A1c was 7.4% four months ago. A1c today 8.2%. A1c was obtained at the end of our visit. Will call the patient to discuss increasing empagliflozin dosage vs adding metformin to her medications to reduce her A1c.   Plan: - Urine ACR today - Continue empagliflozin 10 mg daily - F/u ophthalmology for yearly eye exam - Discuss starting metformin    Addendum: Called and updated on worsening A1c. Patient reports compliance with empagliflozin 10 mg daily. Discussed options of further dietary modification, starting metformin, increasing dosage of empagliflozin to improve A1c. Patient does not want to start metformin given GI side effects in the past. She prefers to have empagliflozin dosage increased. Sent in prescription for empagliflozin 25 mg daily.

## 2022-03-01 NOTE — Progress Notes (Signed)
CC: cough  HPI:  [name] is a 82 y.o. 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   Patient presents w/ fever, chills, productive cough, ST, nasal congestion, and wheezing for the last 3 weeks. Patient denies facial pain, n/v/d, abdominal pain, HA, dizziness, or lightheadedness. Patient reports they are eat and drink appropriately. Patient was seen by her allergist on 02/07/2022. CXR from that day demonstrated subtle left infrahilar heterogeneous opacity on frontal view that may represent atelectasis vs early pneumonia and hyperinflation. Covid testing was negative. She was given prednisone 10 mg BID (5 day taper) and discussed OTC mucinex, tessalon perles, netti pot, cetirizine 10 mg daily, fluticasone, albuterol, singulair, symbicort. She reports no symptom improvement and is having to use her inhalers more frequently.   Patient has a history of T2DM. Current medications include empagliflozin 10 mg daily. Patient states that they are compliant with this medication. Patient does not check their blood sugar at home regularly. Patient denies polyuria, polydipsia, fatigue. Patient states that they do visit the ophthalmologist for yearly eye exams.   Patient also has a history of HFpEF and HTN. Current medications include amLODipine-olmesartan 10-40 mg daily, spironolactone 25 mg BID, and metoprolol succinate 50 mg daily.  Patient states that they are usually compliant with these medications but has ran out of spironolactone. Patient states that they do check their BP regularly at home. Patient denies HA, lightheadedness, dizziness, CP, or SOB.   Patient has a history of HLD. Current medications include rosuvastatin 40 mg daily and ezetimibe 10 mg daily. Patient states that they are compliant with this medication.   Allergies as of  03/01/2022       Reactions   Bee Venom Anaphylaxis   Yellow jackets Yellow jackets   Penicillins Hives   Has patient had a PCN reaction causing immediate rash, facial/tongue/throat swelling, SOB or lightheadedness with hypotension: Yes Has patient had a PCN reaction causing severe rash involving mucus membranes or skin necrosis: No Has patient had a PCN reaction that required hospitalization No Has patient had a PCN reaction occurring within the last 10 years: No If all of the above answers are "NO", then may proceed with Cephalosporin use.        Medication List        Accurate as of March 01, 2022 11:27 AM. If you have any questions, ask your nurse or doctor.          acetaminophen 500 MG tablet Commonly known as: TYLENOL Take 2 tablets (1,000 mg total) by mouth every 8 (eight) hours as needed for moderate pain.   albuterol 108 (90 Base) MCG/ACT inhaler Commonly known as: VENTOLIN HFA Inhale 2 puffs into the lungs every 4 (four) hours as needed for wheezing or shortness of breath.   amLODipine-olmesartan 10-40 MG tablet Commonly known as: Azor Take 1 tablet by mouth daily.   benzonatate 100 MG capsule Commonly known as: Tessalon Perles Take 1 capsule (100 mg total) by mouth 3 (three) times daily as needed for cough.   budesonide-formoterol 160-4.5 MCG/ACT inhaler Commonly known as: Symbicort INHALE 2 PUFFS BY MOUTH INTO THE LUNGS TWICE DAILY RINSE GARGLE AND SPIT AFTER USE   carbamazepine 200 MG 12 hr tablet Commonly known as: TEGretol-XR Take 1 tablet (200 mg total) by mouth daily.   cholecalciferol 25 MCG (1000 UNIT) tablet Commonly known as: VITAMIN  D3 Take 1 tablet (1,000 Units total) by mouth daily.   diclofenac Sodium 1 % Gel Commonly known as: Voltaren Apply to small joints of the hands and wrist up to 4 times daily   DULoxetine 30 MG capsule Commonly known as: CYMBALTA TAKE 2 CAPSULES EVERY DAY   empagliflozin 10 MG Tabs tablet Commonly known  as: Jardiance Take 1 tablet (10 mg total) by mouth daily.   EPINEPHrine 0.3 mg/0.3 mL Soaj injection Commonly known as: EPI-PEN Inject 0.3 mg into the muscle as needed for anaphylaxis.   EPINEPHrine 0.3 mg/0.3 mL Soaj injection Commonly known as: EPI-PEN Use as directed for severe allergic reaction   esomeprazole 40 MG capsule Commonly known as: NEXIUM Take 1 capsule (40 mg total) by mouth daily.   ezetimibe 10 MG tablet Commonly known as: ZETIA Take 1 tablet (10 mg total) by mouth daily.   Fasenra Pen 30 MG/ML Soaj Generic drug: Benralizumab Inject 1 mL (30 mg total) into the skin every 8 (eight) weeks.   fluticasone 50 MCG/ACT nasal spray Commonly known as: FLONASE Place 2 sprays into both nostrils daily.   hydrocortisone cream 1 % Apply to affected area 2 times daily   hydrOXYzine 25 MG tablet Commonly known as: ATARAX TAKE 1 TABLET BY MOUTH AT BEDTIME FOR ANXIETY   ipratropium-albuterol 0.5-2.5 (3) MG/3ML Soln Commonly known as: DUONEB Take 3 mLs by nebulization every 6 (six) hours as needed.   levothyroxine 50 MCG tablet Commonly known as: SYNTHROID TAKE 1 TABLET EVERY DAY (NEED MD APPOINTMENT FOR REFILLS)   metoprolol succinate 50 MG 24 hr tablet Commonly known as: Toprol XL Take 1 tablet (50 mg total) by mouth daily. Take with or immediately following a meal.   montelukast 10 MG tablet Commonly known as: SINGULAIR Take 1 tablet (10 mg total) by mouth at bedtime.   predniSONE 10 MG tablet Commonly known as: DELTASONE 2 tablets 2 times daily for 3 days, 2 tablets on the 4th day, 1 tablet on the 5th day.   rosuvastatin 40 MG tablet Commonly known as: CRESTOR TAKE 1 TABLET EVERY DAY (NEED MD APPOINTMENT FOR REFILLS)   spironolactone 25 MG tablet Commonly known as: Aldactone Take 2 tablets (50 mg total) by mouth daily.   topiramate 50 MG tablet Commonly known as: TOPAMAX Take 1 tablet (50 mg total) by mouth daily.   triamcinolone cream 0.1  % Commonly known as: KENALOG Apply 1 application topically daily.   True Metrix Blood Glucose Test test strip Generic drug: glucose blood Use as instructed   True Metrix Meter w/Device Kit Use as instructed   TRUEplus Lancets 33G Misc USE AS DIRECTED         Past Medical History:  Diagnosis Date   Arthritis    Asthma    Asthma 01/10/2016   Last Assessment & Plan:  After recent ED eval- currrently utilizing prednisone as prescribed. Begin Levaquin, prednisone and Albuterol MDI to be utilized as needed for acute wheezing FU in 2 day is no improvement or worsening of symptoms. Pt verbalizes understanding for current treatment plan. Follow-up in one week. Plan to increase chronic inhaled steroid dose to assess further with prevention of   Diabetes mellitus without complication ([place])    Diabetes II   Difficulty sleeping 12/28/2013   Last Assessment & Plan:  Stable with trazodone nightly. Continue current treatment plan. Refill trazodone today. Tolerating medication well without side effects. Return in 3 months for follow up or sooner if any worsening of symptoms.  DOE (dyspnea on exertion) 07/01/2012   DVT (deep venous thrombosis) ([place])    "Years ago"   Dyslipidemia    Eczema    GERD (gastroesophageal reflux disease)    Headache    Migraine   Hypertension    Hypokalemia 04/30/2016   Hypothyroidism    LBBB (left bundle branch block)    MGUS (monoclonal gammopathy of unknown significance)    MGUS (monoclonal gammopathy of unknown significance)    Migraine    OSA on CPAP    Sinus infection    had sinus surgery   Review of Systems:  per HPI.   Physical Exam: Vitals:   03/01/22 1048 03/01/22 1120  BP: (!) 155/81 129/63  Pulse: 83 73  Temp: 98.2 F (36.8 C)   TempSrc: Oral   SpO2: 99%   Weight: 192 lb 3.2 oz (87.2 kg)   Height: '5\' 7"'$  (1.702 m)    Constitutional: Well-developed, well-nourished, appears uncomfortable, actively coughing HENT: Normocephalic and  atraumatic.  Eyes: EOM are normal. PERRL.  Neck: Normal range of motion.  Cardiovascular: Regular rate, regular rhythm. No murmurs, rubs, or gallops. Normal radial and PT pulses bilaterally. No LE edema.  Pulmonary: Normal respiratory effort. No rales or rhonchi. Wheezing in all lung fields. Minimal crackles at bilateral lung bases.  Abdominal: Soft. Non-distended. No tenderness. Normal bowel sounds.  Musculoskeletal: Normal range of motion.     Neurological: Alert and oriented to person, place, and time. Non-focal. Skin: warm and dry.    Assessment & Plan:   See Encounters Tab for problem based charting.  Patient seen with Dr. Evette Doffing

## 2022-03-01 NOTE — Assessment & Plan Note (Signed)
Current medications include rosuvastatin 40 mg daily and ezetimibe 10 mg daily. Patient states that they are compliant with this medication. Lipid panel from 3 months ago WNL w/ exception of LDL 118.   Plan: - Continue rosuvastatin 40 mg daily and ezetimibe 10 mg daily - Repeat lipid panel in 9 months

## 2022-03-01 NOTE — Patient Instructions (Addendum)
Thank you for coming to see [place] in clinic Andrea Lowery.   Plan: - Please take Levofloxacin (750 mg once daily for 5 days) - Please take Azithromycin (500 mg once daily for 3 days) - Please take prednisone 10 mg (4 tablets for 3 days, followed by 2 tablets for 1 day, followed by 1 tablet for 1 day)  - We refilled your spironolactone today  It was very nice to see you, thank you for allowing [place] to be involved in your care.

## 2022-03-01 NOTE — Assessment & Plan Note (Signed)
Echo from 11/2018 demosntrates EF 60-65% w/ G1DD. Current medications include amLODipine-olmesartan 10-40 mg daily, spironolactone 25 mg BID, and metoprolol succinate 50 mg daily.  Patient states that they are usually compliant with these medications but has ran out of spironolactone. Patient states that they do check their BP regularly at home. Patient denies HA, lightheadedness, dizziness, CP, or SOB. Initial BP today is 155/81. Repeat BP is 129/63.   Plan: - Continue amLODipine-olmesartan 10-40 mg daily, spironolactone 25 mg BID (refilled today), and metoprolol succinate 50 mg daily

## 2022-03-04 ENCOUNTER — Other Ambulatory Visit: Payer: Self-pay

## 2022-03-04 ENCOUNTER — Emergency Department (HOSPITAL_COMMUNITY): Payer: Medicare HMO

## 2022-03-04 ENCOUNTER — Emergency Department (HOSPITAL_COMMUNITY)
Admission: EM | Admit: 2022-03-04 | Discharge: 2022-03-05 | Disposition: A | Discharge status: Home / Self Care | Payer: Medicare HMO | Attending: Emergency Medicine | Admitting: Emergency Medicine

## 2022-03-04 ENCOUNTER — Ambulatory Visit (INDEPENDENT_AMBULATORY_CARE_PROVIDER_SITE_OTHER)
Admission: EM | Admit: 2022-03-04 | Discharge: 2022-03-04 | Disposition: A | Discharge status: Home / Self Care | Payer: Medicare HMO | Source: Home / Self Care

## 2022-03-04 DIAGNOSIS — R0789 Other chest pain: Secondary | ICD-10-CM

## 2022-03-04 DIAGNOSIS — R1010 Upper abdominal pain, unspecified: Secondary | ICD-10-CM | POA: Insufficient documentation

## 2022-03-04 DIAGNOSIS — I447 Left bundle-branch block, unspecified: Secondary | ICD-10-CM | POA: Insufficient documentation

## 2022-03-04 DIAGNOSIS — R079 Chest pain, unspecified: Secondary | ICD-10-CM

## 2022-03-04 LAB — BASIC METABOLIC PANEL
Anion gap: 11 (ref 5–15)
BUN: 8 mg/dL (ref 8–23)
CO2: 26 mmol/L (ref 22–32)
Calcium: 9.2 mg/dL (ref 8.9–10.3)
Chloride: 99 mmol/L (ref 98–111)
Creatinine, Ser: 0.99 mg/dL (ref 0.44–1.00)
GFR, Estimated: 58 mL/min — ABNORMAL LOW (ref >60)
Glucose, Bld: 161 mg/dL — ABNORMAL HIGH (ref 70–99)
Potassium: 3.4 mmol/L — ABNORMAL LOW (ref 3.5–5.1)
Sodium: 136 mmol/L (ref 135–145)

## 2022-03-04 LAB — CBC WITH DIFFERENTIAL/PLATELET
Abs Immature Granulocytes: 0.01 10*3/uL (ref 0.00–0.07)
Basophils Absolute: 0 10*3/uL (ref 0.0–0.1)
Basophils Relative: 0 %
Eosinophils Absolute: 0 10*3/uL (ref 0.0–0.5)
Eosinophils Relative: 0 %
HCT: 34.7 % — ABNORMAL LOW (ref 36.0–46.0)
Hemoglobin: 12.2 g/dL (ref 12.0–15.0)
Immature Granulocytes: 0 %
Lymphocytes Relative: 28 %
Lymphs Abs: 1.8 10*3/uL (ref 0.7–4.0)
MCH: 29.5 pg (ref 26.0–34.0)
MCHC: 35.2 g/dL (ref 30.0–36.0)
MCV: 84 fL (ref 80.0–100.0)
Monocytes Absolute: 0.7 10*3/uL (ref 0.1–1.0)
Monocytes Relative: 11 %
Neutro Abs: 3.9 10*3/uL (ref 1.7–7.7)
Neutrophils Relative %: 61 %
Platelets: 179 10*3/uL (ref 150–400)
RBC: 4.13 MIL/uL (ref 3.87–5.11)
RDW: 13.8 % (ref 11.5–15.5)
WBC: 6.4 10*3/uL (ref 4.0–10.5)
nRBC: 0 % (ref 0.0–0.2)

## 2022-03-04 LAB — BRAIN NATRIURETIC PEPTIDE: B Natriuretic Peptide: 19.8 pg/mL (ref 0.0–100.0)

## 2022-03-04 LAB — TROPONIN I (HIGH SENSITIVITY): Troponin I (High Sensitivity): 4 ng/L (ref <18)

## 2022-03-04 MED ORDER — BENZONATATE 100 MG PO CAPS: 100.0000 mg | ORAL_CAPSULE | Freq: Three times a day (TID) | ORAL | 0 refills | Status: DC | PRN

## 2022-03-04 NOTE — Progress Notes (Signed)
Internal Medicine Clinic Attending  Case discussed with Dr. Alton Revere  At the time of the visit.  We reviewed the resident's history and exam and pertinent patient test results.  I agree with the assessment, diagnosis, and plan of care documented in the resident's note.   As a correction, the patient will be treated on Levaquin alone for pneumonia given her severe penicillin allergy. She will not be treated with azithromycin. She was counseled on the side effect profile of fluoroquinolones.

## 2022-03-04 NOTE — ED Provider Notes (Signed)
[name]    CSN: [id number] Arrival date & time: 03/04/22  1910      History   Chief Complaint Chief Complaint  Patient presents with   Chest Pain    HPI Andrea Lowery is a 82 y.o. female.   Patient here today for evaluation of chest pressure, shortness of breath, dizziness that started a few days ago. She has not had any vomiting but has felt nauseated. She has history of CHF as well as LBBB and is followed by cardiology. She was recently prescribed levaquin, zpak, and prednisone for pneumonia treatment.   The history is provided by the patient.  Chest Pain Associated symptoms: dizziness, nausea and shortness of breath   Associated symptoms: no fever and no vomiting     Past Medical History:  Diagnosis Date   Arthritis    Asthma    Asthma 01/10/2016   Last Assessment & Plan:  After recent ED eval- currrently utilizing prednisone as prescribed. Begin Levaquin, prednisone and Albuterol MDI to be utilized as needed for acute wheezing FU in 2 day is no improvement or worsening of symptoms. Pt verbalizes understanding for current treatment plan. Follow-up in one week. Plan to increase chronic inhaled steroid dose to assess further with prevention of   Diabetes mellitus without complication ([place])    Diabetes II   Difficulty sleeping 12/28/2013   Last Assessment & Plan:  Stable with trazodone nightly. Continue current treatment plan. Refill trazodone today. Tolerating medication well without side effects. Return in 3 months for follow up or sooner if any worsening of symptoms.    DOE (dyspnea on exertion) 07/01/2012   DVT (deep venous thrombosis) ([place])    "Years ago"   Dyslipidemia    Eczema    GERD (gastroesophageal reflux disease)    Headache    Migraine   Hypertension    Hypokalemia 04/30/2016   Hypothyroidism    LBBB (left bundle branch block)    MGUS (monoclonal gammopathy of unknown significance)    MGUS (monoclonal gammopathy of unknown  significance)    Migraine    OSA on CPAP    Sinus infection    had sinus surgery    Patient Active Problem List   Diagnosis Date Noted   Toxic effect of venom of bees, unintentional 02/07/2022   At increased risk of exposure to COVID-19 virus 02/07/2022   Asthma 11/05/2021   Fall 11/05/2021   Trigeminal neuralgia of left side of face 11/05/2021   Piriformis syndrome of right side 11/05/2021   Arthritis 11/05/2021   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   Cough 10/22/2021   Vitamin D insufficiency 10/15/2021   De Quervain's tenosynovitis, left 09/21/2021   Urinary frequency 07/25/2021   Protrusion of lumbar intervertebral disc 09/13/2019   Seasonal and perennial allergic rhinitis 08/18/2019   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   Eczema 08/02/2016   Osteoporosis without current pathological fracture 05/30/2015   Chronic right-sided low back pain with right-sided sciatica 03/31/2015   Type 2 diabetes mellitus ([place]) 99/24/2683   Diastolic CHF ([place]) 41/96/2229   Sacroiliac dysfunction 11/04/2014   LPRD (laryngopharyngeal reflux disease) 10/05/2014   Hypothyroidism 09/21/2014   Anxiety and depression 03/18/2014   Healthcare maintenance 01/25/2014   Hyperlipidemia 12/27/2013   Bilateral leg edema 07/20/2013   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/2013   Essential hypertension 07/01/2012    Past Surgical History:  Procedure Laterality Date   ABDOMINAL HYSTERECTOMY  1969   ARTERY BIOPSY N/A 11/07/2014   Procedure: BIOPSY  TEMPORAL ARTERY;  Surgeon: Leta Baptist, MD;  Location: [facility] OR;  Service: ENT;  Laterality: N/A;   CARPAL TUNNEL RELEASE Right    CHOLECYSTECTOMY  1976   ENDOVENOUS ABLATION SAPHENOUS VEIN W/ LASER Left 08/24/2020   endovenous laser ablation left greater saphenous vein by Gae Gallop MD   LUMBAR LAMINECTOMY/DECOMPRESSION MICRODISCECTOMY N/A 07/31/2015   Procedure: L4-5 Decompression, Possible Right L4-5 Microdiscectomy;  Surgeon: Marybelle Killings, MD;   Location: [place];  Service: Orthopedics;  Laterality: N/A;   NASAL SINUS SURGERY     "years ago"   NM MYOVIEW LTD  02/13/2010   No ischemia   PLANTAR FASCIA RELEASE     TONSILLECTOMY  1965   US ECHOCARDIOGRAPHY  09/20/2008   borderline LVH,mild TR,AOV mildly sclerotic w/ca+ of the leaflets    OB History   No obstetric history on file.      Home Medications    Prior to Admission medications   Medication Sig Start Date End Date Taking? Authorizing Provider  acetaminophen (TYLENOL) 500 MG tablet Take 2 tablets (1,000 mg total) by mouth every 8 (eight) hours as needed for moderate pain. 11/05/21 11/05/22  Charise Killian, MD  albuterol (VENTOLIN HFA) 108 (90 Base) MCG/ACT inhaler Inhale 2 puffs into the lungs every 4 (four) hours as needed for wheezing or shortness of breath. 02/07/22   Dara Hoyer, FNP  amLODipine-olmesartan (AZOR) 10-40 MG tablet Take 1 tablet by mouth daily. 05/17/21 05/12/22  Timothy Lasso, MD  azithromycin (ZITHROMAX) 500 MG tablet Take 1 tablet (500 mg total) by mouth daily for 3 days. Take 1 tablet daily for 3 days. 03/01/22 03/04/22  Mapp, Claudia Desanctis, MD  Benralizumab (FASENRA PEN) 30 MG/ML SOAJ Inject 1 mL (30 mg total) into the skin every 8 (eight) weeks. 05/22/21   Timothy Lasso, MD  benzonatate (TESSALON PERLES) 100 MG capsule Take 1 capsule (100 mg total) by mouth 3 (three) times daily as needed for cough. 03/04/22   Mapp, Claudia Desanctis, MD  Blood Glucose Monitoring Suppl (TRUE METRIX METER) w/Device KIT Use as instructed 05/22/21   Timothy Lasso, MD  budesonide-formoterol (SYMBICORT) 160-4.5 MCG/ACT inhaler INHALE 2 PUFFS BY MOUTH INTO THE LUNGS TWICE DAILY RINSE GARGLE AND SPIT AFTER USE 02/07/22   Ambs, Kathrine Cords, FNP  carbamazepine (TEGRETOL-XR) 200 MG 12 hr tablet Take 1 tablet (200 mg total) by mouth daily. Patient not taking: Reported on 02/07/2022 01/16/22 05/16/22  Charise Killian, MD  cholecalciferol (VITAMIN D3) 25 MCG (1000 UNIT) tablet Take 1 tablet (1,000 Units total) by mouth  daily. Patient not taking: Reported on 02/07/2022 10/15/21   Masters, Joellen Jersey, DO  diclofenac Sodium (VOLTAREN) 1 % GEL Apply to small joints of the hands and wrist up to 4 times daily 11/05/21   Charise Killian, MD  DULoxetine (CYMBALTA) 30 MG capsule TAKE 2 CAPSULES EVERY DAY Patient not taking: Reported on 02/07/2022 11/07/21   Charise Killian, MD  empagliflozin (JARDIANCE) 10 MG TABS tablet Take 1 tablet (10 mg total) by mouth daily. 05/18/21   Timothy Lasso, MD  EPINEPHrine 0.3 mg/0.3 mL IJ SOAJ injection Inject 0.3 mg into the muscle as needed for anaphylaxis. 08/17/21   Mickie Hillier, PA-C  EPINEPHrine 0.3 mg/0.3 mL IJ SOAJ injection Use as directed for severe allergic reaction 02/07/22   Ambs, Kathrine Cords, FNP  esomeprazole (NEXIUM) 40 MG capsule Take 1 capsule (40 mg total) by mouth daily. 01/16/22   Charise Killian, MD  ezetimibe (ZETIA) 10 MG tablet Take 1 tablet (10 mg total)  by mouth daily. 01/16/22   Charise Killian, MD  fluticasone (FLONASE) 50 MCG/ACT nasal spray Place 2 sprays into both nostrils daily. 02/07/22   Dara Hoyer, FNP  glucose blood (TRUE METRIX BLOOD GLUCOSE TEST) test strip Use as instructed 05/22/21   Timothy Lasso, MD  hydrocortisone cream 1 % Apply to affected area 2 times daily Patient not taking: Reported on 02/07/2022 08/17/21   Mickie Hillier, PA-C  hydrOXYzine (ATARAX) 25 MG tablet TAKE 1 TABLET BY MOUTH AT BEDTIME FOR ANXIETY Patient not taking: Reported on 02/07/2022 01/11/22   Charise Killian, MD  ipratropium-albuterol (DUONEB) 0.5-2.5 (3) MG/3ML SOLN Take 3 mLs by nebulization every 6 (six) hours as needed. Patient not taking: Reported on 02/07/2022 05/17/21   Timothy Lasso, MD  levofloxacin (LEVAQUIN) 750 MG tablet Take 1 tablet (750 mg total) by mouth daily. 03/01/22   Mapp, Claudia Desanctis, MD  levothyroxine (SYNTHROID) 50 MCG tablet TAKE 1 TABLET EVERY DAY (NEED MD APPOINTMENT FOR REFILLS) 01/04/22   Lucious Groves, DO  metoprolol succinate (TOPROL XL) 50 MG 24 hr tablet Take 1 tablet (50 mg total) by  mouth daily. Take with or immediately following a meal. Patient not taking: Reported on 02/07/2022 07/24/21 07/24/22  Lacinda Axon, MD  montelukast (SINGULAIR) 10 MG tablet Take 1 tablet (10 mg total) by mouth at bedtime. 02/07/22   Ambs, Kathrine Cords, FNP  predniSONE (DELTASONE) 10 MG tablet 2 tablets 2 times daily for 3 days, 2 tablets on the 4th day, 1 tablet on the 5th day. 03/01/22   Mapp, Claudia Desanctis, MD  rosuvastatin (CRESTOR) 40 MG tablet TAKE 1 TABLET EVERY DAY (NEED MD APPOINTMENT FOR REFILLS) 01/04/22   Lucious Groves, DO  spironolactone (ALDACTONE) 25 MG tablet Take 2 tablets (50 mg total) by mouth daily. 03/01/22 03/31/22  Mapp, Claudia Desanctis, MD  topiramate (TOPAMAX) 50 MG tablet Take 1 tablet (50 mg total) by mouth daily. 05/17/21 12/11/21  Timothy Lasso, MD  triamcinolone cream (KENALOG) 0.1 % Apply 1 application topically daily. Patient not taking: Reported on 02/07/2022 08/30/20   Lavonna Monarch, MD  TRUEplus Lancets 33G MISC USE AS DIRECTED 09/10/21   Axel Filler, MD    Family History Family History  Problem Relation Age of Onset   Dementia Mother    Gallbladder disease Mother    Diabetes Father    Heart failure Father    Hypertension Father    Sleep apnea Father    Kidney disease Father    Dementia Father    Thyroid disease Sister    Hypertension Sister    Dementia Sister    Healthy Sister     Social History Social History   Tobacco Use   Smoking status: Former    Types: Cigarettes    Quit date: 02/04/2006    Years since quitting: 16.0   Smokeless tobacco: Never  Vaping Use   Vaping Use: Never used  Substance Use Topics   Alcohol use: No    Alcohol/week: 0.0 standard drinks of alcohol   Drug use: No     Allergies   Bee venom and Penicillins   Review of Systems Review of Systems  Constitutional:  Negative for chills and fever.  Eyes:  Negative for discharge and redness.  Respiratory:  Positive for shortness of breath.   Cardiovascular:  Positive for chest  pain.  Gastrointestinal:  Positive for nausea. Negative for vomiting.  Neurological:  Positive for dizziness and light-headedness.     Physical Exam Triage Vital Signs  ED Triage Vitals [03/04/22 1914]  Enc Vitals Group     BP (!) 172/93     Pulse Rate (!) 101     Resp 18     Temp 98 F (36.7 C)     Temp Source Oral     SpO2 98 %     Weight      Height      Head Circumference      Peak Flow      Pain Score 9     Pain Loc      Pain Edu?      Excl. in [place]?    No data found.  Updated Vital Signs BP (!) 172/93 (BP Location: Right Arm)   Pulse (!) 101   Temp 98 F (36.7 C) (Oral)   Resp 18   SpO2 98%   Visual Acuity Right Eye Distance:   Left Eye Distance:   Bilateral Distance:    Right Eye Near:   Left Eye Near:    Bilateral Near:     Physical Exam Vitals and nursing note reviewed.  Constitutional:      General: She is not in acute distress.    Appearance: She is well-developed. She is not ill-appearing.  HENT:     Head: Normocephalic and atraumatic.  Eyes:     Conjunctiva/sclera: Conjunctivae normal.  Cardiovascular:     Rate and Rhythm: Normal rate and regular rhythm.  Pulmonary:     Effort: Pulmonary effort is normal. No respiratory distress.     Breath sounds: Wheezing (rare scattered wheeze) present. No rales.  Neurological:     Mental Status: She is alert.  Psychiatric:        Mood and Affect: Mood normal.        Behavior: Behavior normal.      UC Treatments / Results  Labs (all labs ordered are listed, but only abnormal results are displayed) Labs Reviewed - No data to display  EKG   Radiology No results found.  Procedures Procedures (including critical care time)  Medications Ordered in UC Medications - No data to display  Initial Impression / Assessment and Plan / UC Course  I have reviewed the triage vital signs and the nursing notes.  Pertinent labs & imaging results that were available during my care of the patient were  reviewed by me and considered in my medical decision making (see chart for details).    EKG relatively similar to prior- however given presentation recommended further evaluation in the ED with EMS transport. Patient is agreeable to same. Care transferred to [facility].  Final Clinical Impressions(s) / UC Diagnoses   Final diagnoses:  Chest pressure  LBBB (left bundle branch block)   Discharge Instructions   None    ED Prescriptions   None    PDMP not reviewed this encounter.   Francene Finders, PA-C 03/04/22 1951

## 2022-03-04 NOTE — ED Provider Triage Note (Signed)
Emergency Medicine Provider Triage Evaluation Note  Andrea Lowery , a 82 y.o. female  was evaluated in triage.  Pt complains of centralized chest pain radiating to upper back with mild shortness of breath.  Started on Saturday.  Received 324 mg aspirin and 2 doses NTG prior to arrival.  Does not report abdominal pain, N/V.  Hx DVT, CHF, DOE, DMT2, GERD, asthma, HTN.  Review of Systems  Positive:  Negative: See above  Physical Exam  BP 136/75 (BP Location: Right Arm)   Pulse 86   Temp 98.2 F (36.8 C) (Oral)   Resp 16   SpO2 100%  Gen:   Awake, no distress   Resp:  Normal effort  MSK:   Moves extremities without difficulty  Other:  Sitting comfortably.  Not tachycardic.  Not diaphoretic.  Chest nontender  Medical Decision Making  Medically screening exam initiated at 9:45 PM.  Appropriate orders placed.  Andrea Lowery was informed that the remainder of the evaluation will be completed by another provider, this initial triage assessment does not replace that evaluation, and the importance of remaining in the ED until their evaluation is complete.     Andrea Rome, PA-C 85/63/14 2150

## 2022-03-04 NOTE — ED Triage Notes (Signed)
Pt c/o palpitations and headache onset ~ sat. Currently on prednisone. States someone called them and said her medicine dose is too high.

## 2022-03-04 NOTE — ED Notes (Signed)
Patient is being discharged from the Urgent Care and sent to the Emergency Department via ems . Per Wells Guiles, patient is in need of higher level of care due to CP. Patient is aware and verbalizes understanding of plan of care.  Vitals:   03/04/22 1914  BP: (!) 172/93  Pulse: (!) 101  Resp: 18  Temp: 98 F (36.7 C)  SpO2: 98%

## 2022-03-04 NOTE — ED Notes (Signed)
Ems at bedside  

## 2022-03-04 NOTE — ED Triage Notes (Signed)
On phone w/ ems per provider

## 2022-03-04 NOTE — ED Triage Notes (Signed)
Patient arrived with EMS from urgent care , reports central chest pain radiating to to upper back with mild SOB onset Saturday , she received ASA 324 mg and 2 NTG sl prior to arrival .

## 2022-03-05 DIAGNOSIS — R0789 Other chest pain: Secondary | ICD-10-CM | POA: Diagnosis not present

## 2022-03-05 LAB — TROPONIN I (HIGH SENSITIVITY): Troponin I (High Sensitivity): 4 ng/L (ref <18)

## 2022-03-05 LAB — MICROALBUMIN / CREATININE URINE RATIO
Creatinine, Urine: 63.9 mg/dL
Microalb/Creat Ratio: <5 mg/g creat (ref 0–29)
Microalbumin, Urine: <3 ug/mL

## 2022-03-05 NOTE — ED Notes (Signed)
Pt verbalized understanding of discharge paperwork and follow-up care.  °

## 2022-03-05 NOTE — Discharge Instructions (Addendum)
You were evaluated here in the emergency department for chest pain. Your EKG is unchanged from before and your heart enzymes remain normal despite the length of time that you have been having pain. You do have some tenderness to your lower chest and upper abdomen making musculoskeletal pain more likely. You appear to be stable for discharge. However, if your symptoms worsen or you have new symptoms, you should return for reevaluation. Please call your primary care doctor for follow-up within the next 1 to 3 days.

## 2022-03-05 NOTE — ED Provider Notes (Signed)
[facility] Provider Note   CSN: [id number] Arrival date & time: 03/04/22  2042     History  Chief Complaint  Patient presents with   Chest Pain    Andrea Lowery is a 82 y.o. female.  HPI 82 yo female ho recent pneumonia treated with levaquin and zithromax, co bloating in chest with tightness that began Friday night symptoms present constantly over weekend, describes as indigestion. Symptoms improved now without intervention.  Symptoms unchanged with patient's interventions such as milk.  She thinks this may be due to being incorrectly started on both zithromax and levaquin.  She states they called her yesterday and had her stop zithromax.  She took both meds Friday, Saturday, and Sunday.   Cough is better, dyspnea mild and improved from prior, no fever since Saturday. Negative for covid and flu.  CXR on [address] by allergist-     Home Medications Prior to Admission medications   Medication Sig Start Date End Date Taking? Authorizing Provider  acetaminophen (TYLENOL) 500 MG tablet Take 2 tablets (1,000 mg total) by mouth every 8 (eight) hours as needed for moderate pain. 11/05/21 11/05/22  Charise Killian, MD  albuterol (VENTOLIN HFA) 108 (90 Base) MCG/ACT inhaler Inhale 2 puffs into the lungs every 4 (four) hours as needed for wheezing or shortness of breath. 02/07/22   Dara Hoyer, FNP  amLODipine-olmesartan (AZOR) 10-40 MG tablet Take 1 tablet by mouth daily. 05/17/21 05/12/22  Timothy Lasso, MD  Benralizumab (FASENRA PEN) 30 MG/ML SOAJ Inject 1 mL (30 mg total) into the skin every 8 (eight) weeks. 05/22/21   Timothy Lasso, MD  benzonatate (TESSALON PERLES) 100 MG capsule Take 1 capsule (100 mg total) by mouth 3 (three) times daily as needed for cough. 03/04/22   Mapp, Claudia Desanctis, MD  Blood Glucose Monitoring Suppl (TRUE METRIX METER) w/Device KIT Use as instructed 05/22/21   Timothy Lasso, MD  budesonide-formoterol (SYMBICORT) 160-4.5  MCG/ACT inhaler INHALE 2 PUFFS BY MOUTH INTO THE LUNGS TWICE DAILY RINSE GARGLE AND SPIT AFTER USE 02/07/22   Ambs, Kathrine Cords, FNP  carbamazepine (TEGRETOL-XR) 200 MG 12 hr tablet Take 1 tablet (200 mg total) by mouth daily. Patient not taking: Reported on 02/07/2022 01/16/22 05/16/22  Charise Killian, MD  cholecalciferol (VITAMIN D3) 25 MCG (1000 UNIT) tablet Take 1 tablet (1,000 Units total) by mouth daily. Patient not taking: Reported on 02/07/2022 10/15/21   Masters, Joellen Jersey, DO  diclofenac Sodium (VOLTAREN) 1 % GEL Apply to small joints of the hands and wrist up to 4 times daily 11/05/21   Charise Killian, MD  DULoxetine (CYMBALTA) 30 MG capsule TAKE 2 CAPSULES EVERY DAY Patient not taking: Reported on 02/07/2022 11/07/21   Charise Killian, MD  empagliflozin (JARDIANCE) 10 MG TABS tablet Take 1 tablet (10 mg total) by mouth daily. 05/18/21   Timothy Lasso, MD  EPINEPHrine 0.3 mg/0.3 mL IJ SOAJ injection Inject 0.3 mg into the muscle as needed for anaphylaxis. 08/17/21   Mickie Hillier, PA-C  EPINEPHrine 0.3 mg/0.3 mL IJ SOAJ injection Use as directed for severe allergic reaction 02/07/22   Ambs, Kathrine Cords, FNP  esomeprazole (NEXIUM) 40 MG capsule Take 1 capsule (40 mg total) by mouth daily. 01/16/22   Charise Killian, MD  ezetimibe (ZETIA) 10 MG tablet Take 1 tablet (10 mg total) by mouth daily. 01/16/22   Charise Killian, MD  fluticasone (FLONASE) 50 MCG/ACT nasal spray Place 2 sprays into both nostrils daily. 02/07/22   Ambs,  Kathrine Cords, FNP  glucose blood (TRUE METRIX BLOOD GLUCOSE TEST) test strip Use as instructed 05/22/21   Timothy Lasso, MD  hydrocortisone cream 1 % Apply to affected area 2 times daily Patient not taking: Reported on 02/07/2022 08/17/21   Mickie Hillier, PA-C  hydrOXYzine (ATARAX) 25 MG tablet TAKE 1 TABLET BY MOUTH AT BEDTIME FOR ANXIETY Patient not taking: Reported on 02/07/2022 01/11/22   Charise Killian, MD  ipratropium-albuterol (DUONEB) 0.5-2.5 (3) MG/3ML SOLN Take 3 mLs by nebulization every 6 (six) hours as  needed. Patient not taking: Reported on 02/07/2022 05/17/21   Timothy Lasso, MD  levofloxacin (LEVAQUIN) 750 MG tablet Take 1 tablet (750 mg total) by mouth daily. 03/01/22   Mapp, Claudia Desanctis, MD  levothyroxine (SYNTHROID) 50 MCG tablet TAKE 1 TABLET EVERY DAY (NEED MD APPOINTMENT FOR REFILLS) 01/04/22   Lucious Groves, DO  metoprolol succinate (TOPROL XL) 50 MG 24 hr tablet Take 1 tablet (50 mg total) by mouth daily. Take with or immediately following a meal. Patient not taking: Reported on 02/07/2022 07/24/21 07/24/22  Lacinda Axon, MD  montelukast (SINGULAIR) 10 MG tablet Take 1 tablet (10 mg total) by mouth at bedtime. 02/07/22   Ambs, Kathrine Cords, FNP  predniSONE (DELTASONE) 10 MG tablet 2 tablets 2 times daily for 3 days, 2 tablets on the 4th day, 1 tablet on the 5th day. 03/01/22   Mapp, Claudia Desanctis, MD  rosuvastatin (CRESTOR) 40 MG tablet TAKE 1 TABLET EVERY DAY (NEED MD APPOINTMENT FOR REFILLS) 01/04/22   Lucious Groves, DO  spironolactone (ALDACTONE) 25 MG tablet Take 2 tablets (50 mg total) by mouth daily. 03/01/22 03/31/22  Mapp, Claudia Desanctis, MD  topiramate (TOPAMAX) 50 MG tablet Take 1 tablet (50 mg total) by mouth daily. 05/17/21 12/11/21  Timothy Lasso, MD  triamcinolone cream (KENALOG) 0.1 % Apply 1 application topically daily. Patient not taking: Reported on 02/07/2022 08/30/20   Lavonna Monarch, MD  TRUEplus Lancets 33G MISC USE AS DIRECTED 09/10/21   Axel Filler, MD      Allergies    Bee venom and Penicillins    Review of Systems   Review of Systems  Physical Exam Updated Vital Signs BP (!) 146/78   Pulse 61   Temp 97.9 F (36.6 C)   Resp 13   SpO2 99%  Physical Exam Vitals and nursing note reviewed.  Constitutional:      Appearance: She is well-developed. She is obese.  HENT:     Head: Normocephalic.  Eyes:     Pupils: Pupils are equal, round, and reactive to light.  Cardiovascular:     Rate and Rhythm: Normal rate and regular rhythm.     Heart sounds: Normal heart sounds.   Pulmonary:     Effort: Pulmonary effort is normal.     Breath sounds: Normal breath sounds.  Chest:     Chest wall: Tenderness present.  Abdominal:     General: Bowel sounds are normal.     Palpations: Abdomen is soft.     Comments: Mild upper abdominal ttp  Musculoskeletal:        General: Normal range of motion.     Cervical back: Normal range of motion.     Right lower leg: No tenderness. No edema.     Left lower leg: No tenderness. No edema.  Skin:    General: Skin is warm and dry.     Capillary Refill: Capillary refill takes less than 2 seconds.  Neurological:  General: No focal deficit present.     Mental Status: She is alert.     ED Results / Procedures / Treatments   Labs (all labs ordered are listed, but only abnormal results are displayed) Labs Reviewed  BASIC METABOLIC PANEL - Abnormal; Notable for the following components:      Result Value   Potassium 3.4 (*)    Glucose, Bld 161 (*)    GFR, Estimated 58 (*)    All other components within normal limits  CBC WITH DIFFERENTIAL/PLATELET - Abnormal; Notable for the following components:   HCT 34.7 (*)    All other components within normal limits  BRAIN NATRIURETIC PEPTIDE  TROPONIN I (HIGH SENSITIVITY)  TROPONIN I (HIGH SENSITIVITY)    EKG EKG Interpretation  Date/Time:  Monday March 04 2022 19:21:12 EST Ventricular Rate:  96 PR Interval:  172 QRS Duration: 140 QT Interval:  394 QTC Calculation: 497 R Axis:   -44 Text Interpretation: Normal sinus rhythm Left axis deviation Left bundle branch block Abnormal ECG hr rate has increased 24 October 2021 Confirmed by Pattricia Boss [phone]) on 03/05/2022 9:18:37 AM  Radiology DG Chest 2 View  Result Date: 03/04/2022 CLINICAL DATA:  Chest pain EXAM: CHEST - 2 VIEW COMPARISON:  02/07/2022 FINDINGS: Lungs are clear.  No pleural effusion or pneumothorax. The heart is normal in size. Visualized osseous structures are within normal limits. IMPRESSION: Normal  chest radiographs. Electronically Signed   By: Julian Hy M.D.   On: 03/04/2022 22:04    Procedures Procedures    Medications Ordered in ED Medications - No data to display  ED Course/ Medical Decision Making/ A&P                             Medical Decision Making 82 yo female with recent ho pneumonia presents today with chest discomfort present for 3 days and improving. Patient has some increased pain with coughing and palpation- ttp both lower chest and upper abdomen.  Abdomen ttp not significantly concerning for intra-abdominal pathology as there is no deep tenderness and no associated nausea or vomiting. Patient seen and evaluated for chest pain.  Differential diagnosis of serious/life threatening causes of chest pain includes ACS, other diseases of the heart such as myocarditis or pericarditis, lung etiologies such as infection or pneumothorax, diseases of the great vessels such as aortic dissection or AAA, pulmonary embolism, or GI sources such as cholecystitis or other upper abdominal causes. Doubt ACS- heart score documented, EKG reviewed, Given the timing of pain to ER presentation,delta troponin0 so doubt NSTEMI troponin and repeat troponin obtained and WNL Doubt myocarditis/pericarditis/tamponade based on history, review of ekg and labs Doubt aortic dissection based on history and review of imaging Doubt intrinsic lung causes such as pneumonia or pneumothorax, based on history, physical exam, and studies obtained. Doubt PE based on history, physical exam, and PERC Doubt acute GI etiology requiring intervention based on history, physical exam and labs. Patient appears stable for discharge. Return precautions and need for follow up discussed and patient voices understanding   Amount and/or Complexity of Data Reviewed Labs: ordered. Decision-making details documented in ED Course. Radiology: ordered and independent interpretation performed. Decision-making details  documented in ED Course. ECG/medicine tests: ordered and independent interpretation performed. Decision-making details documented in ED Course.          Final Clinical Impression(s) / ED Diagnoses Final diagnoses:  Chest pain, unspecified type    Rx /  DC Orders ED Discharge Orders     None         Pattricia Boss, MD 03/05/22 [phone]

## 2022-03-07 ENCOUNTER — Other Ambulatory Visit: Payer: Self-pay

## 2022-03-07 MED ORDER — EMPAGLIFLOZIN 25 MG PO TABS: 25.0000 mg | ORAL_TABLET | Freq: Every day | ORAL | 2 refills | Status: DC

## 2022-03-07 NOTE — Progress Notes (Signed)
Called and updated on worsening A1c. Patient reports compliance with empagliflozin 10 mg daily. Discussed options of further dietary modification, starting metformin, increasing dosage of empagliflozin to improve A1c. Patient does not want to start metformin given GI side effects in the past. She prefers to have empagliflozin dosage increased. Sent in prescription for empagliflozin 25 mg daily.

## 2022-03-11 ENCOUNTER — Other Ambulatory Visit: Payer: Self-pay

## 2022-03-11 DIAGNOSIS — R051 Acute cough: Secondary | ICD-10-CM

## 2022-03-12 ENCOUNTER — Ambulatory Visit: Payer: Medicare HMO

## 2022-03-13 NOTE — Addendum Note (Signed)
Addended by: Truddie Crumble on: 03/13/2022 09:06 AM   Modules accepted: Orders

## 2022-03-18 ENCOUNTER — Other Ambulatory Visit: Payer: Self-pay | Admitting: Internal Medicine

## 2022-03-18 NOTE — Progress Notes (Signed)
VIALS EXP 03-19-23

## 2022-03-19 DIAGNOSIS — J3089 Other allergic rhinitis: Secondary | ICD-10-CM

## 2022-03-19 NOTE — Telephone Encounter (Signed)
Andrea Lowery reports no longer taking topiramate daily. Will remove from medication list.

## 2022-03-19 NOTE — Telephone Encounter (Signed)
Called to confirm Ms. Roeper is still taking topiramate daily. No answer, will attempt again.

## 2022-03-20 DIAGNOSIS — J301 Allergic rhinitis due to pollen: Secondary | ICD-10-CM

## 2022-03-23 ENCOUNTER — Other Ambulatory Visit: Payer: Self-pay

## 2022-03-23 DIAGNOSIS — I10 Essential (primary) hypertension: Secondary | ICD-10-CM

## 2022-03-31 ENCOUNTER — Other Ambulatory Visit: Payer: Self-pay | Admitting: Internal Medicine

## 2022-04-02 ENCOUNTER — Telehealth: Payer: Self-pay

## 2022-04-02 ENCOUNTER — Ambulatory Visit (INDEPENDENT_AMBULATORY_CARE_PROVIDER_SITE_OTHER): Payer: Medicare HMO

## 2022-04-02 ENCOUNTER — Ambulatory Visit: Payer: Medicare HMO

## 2022-04-02 ENCOUNTER — Other Ambulatory Visit: Payer: Self-pay | Admitting: *Deleted

## 2022-04-02 DIAGNOSIS — J4551 Severe persistent asthma with (acute) exacerbation: Secondary | ICD-10-CM

## 2022-04-02 DIAGNOSIS — J4550 Severe persistent asthma, uncomplicated: Secondary | ICD-10-CM

## 2022-04-02 MED ORDER — CETIRIZINE HCL 10 MG PO TABS: 10.0000 mg | ORAL_TABLET | Freq: Every day | ORAL | 5 refills | Status: DC

## 2022-04-02 NOTE — Telephone Encounter (Signed)
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 She is 6 months out, where should she restart?   Andrea Lowery [phone]

## 2022-04-02 NOTE — Telephone Encounter (Signed)
Refill for zyrtec.

## 2022-04-02 NOTE — Telephone Encounter (Signed)
error 

## 2022-04-03 NOTE — Telephone Encounter (Signed)
Entered how to restart in her flow sheet.

## 2022-04-10 ENCOUNTER — Ambulatory Visit (INDEPENDENT_AMBULATORY_CARE_PROVIDER_SITE_OTHER): Payer: Medicare HMO

## 2022-04-10 DIAGNOSIS — T63441D Toxic effect of venom of bees, accidental (unintentional), subsequent encounter: Secondary | ICD-10-CM

## 2022-04-12 ENCOUNTER — Ambulatory Visit (INDEPENDENT_AMBULATORY_CARE_PROVIDER_SITE_OTHER): Payer: Medicare HMO

## 2022-04-12 DIAGNOSIS — J309 Allergic rhinitis, unspecified: Secondary | ICD-10-CM | POA: Diagnosis not present

## 2022-04-18 ENCOUNTER — Ambulatory Visit (INDEPENDENT_AMBULATORY_CARE_PROVIDER_SITE_OTHER): Payer: Medicare HMO

## 2022-04-18 DIAGNOSIS — J309 Allergic rhinitis, unspecified: Secondary | ICD-10-CM

## 2022-04-22 ENCOUNTER — Ambulatory Visit (INDEPENDENT_AMBULATORY_CARE_PROVIDER_SITE_OTHER): Payer: Medicare HMO | Admitting: Family Medicine

## 2022-04-22 ENCOUNTER — Encounter: Payer: Self-pay | Admitting: Family Medicine

## 2022-04-22 ENCOUNTER — Other Ambulatory Visit: Payer: Self-pay

## 2022-04-22 VITALS — BP 160/80 | HR 70 | Temp 97.9°F | Resp 16 | Wt 195.6 lb

## 2022-04-22 DIAGNOSIS — R051 Acute cough: Secondary | ICD-10-CM

## 2022-04-22 DIAGNOSIS — H1010 Acute atopic conjunctivitis, unspecified eye: Secondary | ICD-10-CM

## 2022-04-22 DIAGNOSIS — H1013 Acute atopic conjunctivitis, bilateral: Secondary | ICD-10-CM

## 2022-04-22 DIAGNOSIS — J301 Allergic rhinitis due to pollen: Secondary | ICD-10-CM | POA: Insufficient documentation

## 2022-04-22 DIAGNOSIS — J4540 Moderate persistent asthma, uncomplicated: Secondary | ICD-10-CM | POA: Insufficient documentation

## 2022-04-22 DIAGNOSIS — K219 Gastro-esophageal reflux disease without esophagitis: Secondary | ICD-10-CM | POA: Diagnosis not present

## 2022-04-22 DIAGNOSIS — T63441D Toxic effect of venom of bees, accidental (unintentional), subsequent encounter: Secondary | ICD-10-CM

## 2022-04-22 DIAGNOSIS — J309 Allergic rhinitis, unspecified: Secondary | ICD-10-CM

## 2022-04-22 MED ORDER — BUDESONIDE-FORMOTEROL FUMARATE 160-4.5 MCG/ACT IN AERO: INHALATION_SPRAY | RESPIRATORY_TRACT | 1 refills | Status: DC

## 2022-04-22 MED ORDER — MONTELUKAST SODIUM 10 MG PO TABS: 10.0000 mg | ORAL_TABLET | Freq: Every evening | ORAL | 1 refills | Status: DC

## 2022-04-22 MED ORDER — BENZONATATE 100 MG PO CAPS: 100.0000 mg | ORAL_CAPSULE | Freq: Three times a day (TID) | ORAL | 0 refills | Status: DC | PRN

## 2022-04-22 NOTE — Patient Instructions (Addendum)
Asthma Continue montelukast 10 mg once a day to prevent cough or wheeze Begin Symbicort 160-2 puffs twice a day with a spacer to prevent cough or wheeze Continue albuterol 2 puffs once every 4 hours as needed for cough or wheeze You may use albuterol 2 puffs 5 to 15 minutes before activity to decrease cough or wheeze Continue Fasenra injections once every 8 weeks  Cough You may take Delsym cough syrup 10 mL twice a day as needed You may take Tessalon Perles 100 mg up to 3 times a day as needed for cough Begin Mucinex 600 to 1200 mg twice a day for the next week or until cough and wheeze free Increase fluid intake as tolerated to thin out mucus Call the clinic if your symptoms worsen, do not improve, or if you develop a fever  Allergic rhinitis Continue cetirizine 10 mg once a day as needed for runny nose or itch Begin Ryaltris 2 sprays in each nostril twice a day Consider saline nasal rinses as needed for nasal symptoms. Use this before any medicated nasal sprays for best result Continue allergen immunotherapy and have access to an epinephrine autoinjector set per protocol  Allergic conjunctivitis Some over the counter eye drops include Pataday one drop in each eye once a day as needed for red, itchy eyes OR Zaditor one drop in each eye twice a day as needed for red itchy eyes. Avoid eye drops that say red eye relief as they may contain medications that dry out your eyes.   Reflux Continue dietary and lifestyle modifications as listed below Increase esomeprazole to 40 mg twice a day for the next 30 days and then resume esomeprazole 40 mg once a day Limit caffeine, chocolate, and peppermint consumption  Stinging insect allergy Continue venom immunotherapy and have access to an epinephrine autoinjector set  Call the clinic if this treatment plan is not working well for you.  Follow up in 1 month or sooner if needed.  Reducing Pollen Exposure The American Academy of Allergy[employer] Asthma  and Immunology suggests the following steps to reduce your exposure to pollen during allergy seasons. Do not hang sheets or clothing out to dry; pollen may collect on these items. Do not mow lawns or spend time around freshly cut grass; mowing stirs up pollen. Keep windows closed at night.  Keep car windows closed while driving. Minimize morning activities outdoors, a time when pollen counts are usually at their highest. Stay indoors as much as possible when pollen counts or humidity is high and on windy days when pollen tends to remain in the air longer. Use air conditioning when possible.  Many air conditioners have filters that trap the pollen spores. Use a HEPA room air filter to remove pollen form the indoor air you breathe.

## 2022-04-22 NOTE — Progress Notes (Signed)
Andrea Lowery [id number] Dept: [phone]  FOLLOW UP NOTE  Patient ID: Andrea Lowery, female    DOB: 1940/10/05  Age: 82 y.o. MRN: [id number] Date of Office Visit: 04/22/2022  Assessment  Chief Complaint: Cough (Productive yellow mucous.)  HPI Andrea Lowery is a 82 year old female who presents to the clinic for follow-up visit.  She was last seen in this clinic on 02/07/2022 by Gareth Morgan, FNP, for evaluation of asthma, allergic rhinitis, reflux, and stinging insect allergy.  At that time, chest x-ray indicated possible pneumonia and she was treated with doxycycline.  At today's visit, she reports that she began to experience cough producing thick yellow mucus about 2 days ago.  She denies fever, sweats, or chills.  She does report that several members of her household are sick with the same cough.  Asthma is reported as moderately well-controlled with occasional wheeze and cough that began 2 days ago.  She denies shortness of breath with activity and rest.  She continues montelukast 10 mg once a day, Symbicort 160 on some days, and has been using albuterol about 1-2 times a day over the last 2 weeks with relief of symptoms.  She continues Fasenra injections once every 8 weeks with no large or local reactions.  She reports a significant decrease in her symptoms of asthma while continuing on Fasenra injections.  Allergic rhinitis is reported as moderately well-controlled with symptoms including sneeze, nasal congestion, and postnasal drainage.  She continues Flonase daily as well as cetirizine 10 mg daily.  She is not currently using a nasal saline rinse.  She continues allergen immunotherapy directed toward tree pollen and dust mite with no large or local reactions.  She reports a significant decrease in her symptoms of allergic rhinitis while continuing on allergen immunotherapy.  Allergic conjunctivitis is reported as moderately well-controlled with occasional red and  itchy eyes for which she is using olopatadine with relief of symptoms.  Reflux is reported as poorly controlled with frequent heartburn over the last several weeks.  She denies vomiting.  She continues esomeprazole 40 mg once a day.  She continues to avoid stinging insects with no incidences since her last visit to this clinic.  She continues venom immunotherapy with no large or local reactions.  EpiPen's are up-to-date.  Her current medications are listed in the chart.   Drug Allergies:  Allergies  Allergen Reactions   Bee Venom Anaphylaxis    Yellow jackets Yellow jackets   Penicillins Hives    Has patient had a PCN reaction causing immediate rash, facial/tongue/throat swelling, SOB or lightheadedness with hypotension: Yes Has patient had a PCN reaction causing severe rash involving mucus membranes or skin necrosis: No Has patient had a PCN reaction that required hospitalization No Has patient had a PCN reaction occurring within the last 10 years: No If all of the above answers are "NO", then may proceed with Cephalosporin use.     Physical Exam: BP (!) 160/80   Pulse 70   Temp 97.9 F (36.6 C) (Temporal)   Resp 16   Wt 195 lb 9.6 oz (88.7 kg)   SpO2 96%   BMI 30.64 kg/m    Physical Exam Vitals reviewed.  Constitutional:      Appearance: Normal appearance.  HENT:     Head: Normocephalic and atraumatic.     Right Ear: Tympanic membrane normal.     Left Ear: Tympanic membrane normal.     Nose:  Comments: Bilateral nares slightly erythematous with clear nasal drainage noted.  Pharynx slightly erythematous with no exudate.  Ears normal.  Eyes normal. Eyes:     Conjunctiva/sclera: Conjunctivae normal.  Cardiovascular:     Rate and Rhythm: Normal rate and regular rhythm.     Heart sounds: Normal heart sounds. No murmur heard. Pulmonary:     Effort: Pulmonary effort is normal.     Breath sounds: Normal breath sounds.     Comments: Lungs clear to  auscultation Musculoskeletal:        General: Normal range of motion.     Cervical back: Normal range of motion and neck supple.  Skin:    General: Skin is warm and dry.  Neurological:     Mental Status: She is alert and oriented to person, place, and time.  Psychiatric:        Mood and Affect: Mood normal.        Behavior: Behavior normal.        Thought Content: Thought content normal.        Judgment: Judgment normal.     Diagnostics: Deferred due to recent illness in her household   Assessment and Plan: 1. Not well controlled moderate persistent asthma   2. Seasonal allergic rhinitis due to pollen   3. Seasonal allergic conjunctivitis   4. LPRD (laryngopharyngeal reflux disease)   5. Toxic effect of venom of bees, unintentional, subsequent encounter   6. Acute cough     Meds ordered this encounter  Medications   montelukast (SINGULAIR) 10 MG tablet    Sig: Take 1 tablet (10 mg total) by mouth at bedtime.    Dispense:  90 tablet    Refill:  1   budesonide-formoterol (SYMBICORT) 160-4.5 MCG/ACT inhaler    Sig: INHALE 2 PUFFS BY MOUTH INTO THE LUNGS TWICE DAILY RINSE GARGLE AND SPIT AFTER USE    Dispense:  30.6 g    Refill:  1   benzonatate (TESSALON PERLES) 100 MG capsule    Sig: Take 1 capsule (100 mg total) by mouth 3 (three) times daily as needed for cough.    Dispense:  20 capsule    Refill:  0    Patient Instructions  Asthma Continue montelukast 10 mg once a day to prevent cough or wheeze Begin Symbicort 160-2 puffs twice a day with a spacer to prevent cough or wheeze Continue albuterol 2 puffs once every 4 hours as needed for cough or wheeze You may use albuterol 2 puffs 5 to 15 minutes before activity to decrease cough or wheeze Continue Fasenra injections once every 8 weeks  Cough You may take Delsym cough syrup 10 mL twice a day as needed You may take Tessalon Perles 100 mg up to 3 times a day as needed for cough Begin Mucinex 600 to 1200 mg twice a  day for the next week or until cough and wheeze free Increase fluid intake as tolerated to thin out mucus  Allergic rhinitis Continue cetirizine 10 mg once a day as needed for runny nose or itch Begin Ryaltris 2 sprays in each nostril twice a day Consider saline nasal rinses as needed for nasal symptoms. Use this before any medicated nasal sprays for best result Continue allergen immunotherapy and have access to an epinephrine autoinjector set per protocol  Allergic conjunctivitis Some over the counter eye drops include Pataday one drop in each eye once a day as needed for red, itchy eyes OR Zaditor one drop in each eye  twice a day as needed for red itchy eyes. Avoid eye drops that say red eye relief as they may contain medications that dry out your eyes.   Reflux Continue dietary and lifestyle modifications as listed below Increase esomeprazole to 40 mg twice a day for the next 30 days and then resume esomeprazole 40 mg once a day Limit caffeine, chocolate, and peppermint consumption  Stinging insect allergy Continue venom immunotherapy and have access to an epinephrine autoinjector set  Call the clinic if this treatment plan is not working well for you.  Follow up in 1 month or sooner if needed.   Return in about 4 weeks (around 05/20/2022), or if symptoms worsen or fail to improve.    Thank you for the opportunity to care for this patient.  Please do not hesitate to contact me with questions.  Gareth Morgan, FNP [facility]

## 2022-04-29 ENCOUNTER — Other Ambulatory Visit: Payer: Self-pay | Admitting: Student in an Organized Health Care Education/Training Program

## 2022-05-08 ENCOUNTER — Other Ambulatory Visit: Payer: Self-pay | Admitting: Family Medicine

## 2022-05-22 ENCOUNTER — Telehealth: Payer: Self-pay

## 2022-05-22 MED ORDER — RYALTRIS 665-25 MCG/ACT NA SUSP: 2.0000 | Freq: Two times a day (BID) | NASAL | 2 refills | Status: DC

## 2022-05-22 NOTE — Telephone Encounter (Signed)
Patient called in - DOB/Pharmacy verified - requested Ryaltris prescription be sent to [facility] [address]  Effie Berkshire to patient Ryaltris prescription usually goes to a specialty pharmacy - I will send prescription to [facility], if copay is to expensive - let the office know so a prescription can be sent to [facility].  Patient verbalized understanding, no further questions.

## 2022-05-24 ENCOUNTER — Telehealth: Payer: Self-pay

## 2022-05-24 NOTE — Telephone Encounter (Signed)
PA request received via CMM for Ryaltris 665-25MCG/ACT suspension  PA has been submitted to [facility] and is pending determination  Key: [id number]

## 2022-05-27 ENCOUNTER — Ambulatory Visit (INDEPENDENT_AMBULATORY_CARE_PROVIDER_SITE_OTHER): Payer: Medicare HMO

## 2022-05-27 DIAGNOSIS — J4550 Severe persistent asthma, uncomplicated: Secondary | ICD-10-CM

## 2022-05-27 MED ORDER — IPRATROPIUM BROMIDE 0.06 % NA SOLN: 2.0000 | Freq: Two times a day (BID) | NASAL | 5 refills | Status: DC

## 2022-05-27 NOTE — Telephone Encounter (Signed)
PA has been DENIED due to:   The drug you asked for is not listed in your preferred drug list (formulary). The preferred drug(s), you may not have tried, are: flunisolide nasal spray ipratropium bromide nasal spray Your provider needs to give [place] medical reasons why the preferred drug(s) would not work for you and/or would have bad side effects. Sometimes a preferred drug needs more review for approval. Additionally, some preferred drugs listed may be the same drugs with different strengths or forms. [employer] may only require one strength or form of that drug to be tried. This decision was from [facility] Non-Formulary Exceptions Coverage Policy.

## 2022-05-27 NOTE — Addendum Note (Signed)
Addended by: Dub Mikes on: 05/27/2022 02:02 PM   Modules accepted: Orders

## 2022-05-27 NOTE — Telephone Encounter (Signed)
Patient came in for her Harrington Challenger and was asking about her nasal spray. I informed patient Anne's recommendation. Patient verbalized understanding.

## 2022-05-27 NOTE — Telephone Encounter (Signed)
Can you please order Atrovent 2 sprays in each nostril up to twice a day for nasal symptoms. Thank you

## 2022-06-03 ENCOUNTER — Other Ambulatory Visit: Payer: Self-pay

## 2022-06-03 DIAGNOSIS — E039 Hypothyroidism, unspecified: Secondary | ICD-10-CM

## 2022-06-03 MED ORDER — LEVOTHYROXINE SODIUM 50 MCG PO TABS: ORAL_TABLET | ORAL | 0 refills | Status: DC

## 2022-06-05 ENCOUNTER — Ambulatory Visit: Payer: Medicare HMO

## 2022-06-05 ENCOUNTER — Ambulatory Visit (INDEPENDENT_AMBULATORY_CARE_PROVIDER_SITE_OTHER): Payer: Medicare HMO | Admitting: *Deleted

## 2022-06-05 DIAGNOSIS — J309 Allergic rhinitis, unspecified: Secondary | ICD-10-CM | POA: Diagnosis not present

## 2022-06-07 ENCOUNTER — Ambulatory Visit (INDEPENDENT_AMBULATORY_CARE_PROVIDER_SITE_OTHER): Payer: Medicare HMO

## 2022-06-07 DIAGNOSIS — T63441D Toxic effect of venom of bees, accidental (unintentional), subsequent encounter: Secondary | ICD-10-CM

## 2022-06-10 NOTE — Telephone Encounter (Signed)
Appointment has been scheduled for 06/17/22 at 8:45 am with Dr. Dickie La.  Patient sent message via my chart and also mailed an appointment card.

## 2022-06-12 ENCOUNTER — Ambulatory Visit
Admission: EM | Admit: 2022-06-12 | Discharge: 2022-06-12 | Disposition: A | Discharge status: Home / Self Care | Payer: Medicare HMO | Attending: Internal Medicine | Admitting: Internal Medicine

## 2022-06-12 DIAGNOSIS — J069 Acute upper respiratory infection, unspecified: Secondary | ICD-10-CM | POA: Insufficient documentation

## 2022-06-12 DIAGNOSIS — Z20822 Contact with and (suspected) exposure to covid-19: Secondary | ICD-10-CM | POA: Insufficient documentation

## 2022-06-12 MED ORDER — BENZONATATE 100 MG PO CAPS: 100.0000 mg | ORAL_CAPSULE | Freq: Three times a day (TID) | ORAL | 0 refills | Status: DC | PRN

## 2022-06-12 NOTE — ED Provider Notes (Signed)
[name]    CSN: [id number] Arrival date & time: 06/12/22  1203      History   Chief Complaint Chief Complaint  Patient presents with   Covid Exposure    HPI Andrea Lowery is a 82 y.o. female.   Patient presents today for COVID testing.  Patient reports that her sister attends an adult daycare and there was a COVID outbreak.  Patient has had nasal congestion and cough for about 4-5 days. denies any fever.  Denies chest pain or shortness of breath.  Patient does have history of asthma but does not use albuterol inhaler.  Does not report any over-the-counter medications for symptoms.     Past Medical History:  Diagnosis Date   Arthritis    Asthma    Asthma 01/10/2016   Last Assessment & Plan:  After recent ED eval- currrently utilizing prednisone as prescribed. Begin Levaquin, prednisone and Albuterol MDI to be utilized as needed for acute wheezing FU in 2 day is no improvement or worsening of symptoms. Pt verbalizes understanding for current treatment plan. Follow-up in one week. Plan to increase chronic inhaled steroid dose to assess further with prevention of   Diabetes mellitus without complication (HCC)    Diabetes II   Difficulty sleeping 12/28/2013   Last Assessment & Plan:  Stable with trazodone nightly. Continue current treatment plan. Refill trazodone today. Tolerating medication well without side effects. Return in 3 months for follow up or sooner if any worsening of symptoms.    DOE (dyspnea on exertion) 07/01/2012   DVT (deep venous thrombosis) (HCC)    "Years ago"   Dyslipidemia    Eczema    GERD (gastroesophageal reflux disease)    Headache    Migraine   Hypertension    Hypokalemia 04/30/2016   Hypothyroidism    LBBB (left bundle branch block)    MGUS (monoclonal gammopathy of unknown significance)    MGUS (monoclonal gammopathy of unknown significance)    Migraine    OSA on CPAP    Sinus infection    had sinus surgery    Patient  Active Problem List   Diagnosis Date Noted   Not well controlled moderate persistent asthma 04/22/2022   Seasonal allergic rhinitis due to pollen 04/22/2022   Seasonal allergic conjunctivitis 04/22/2022   Toxic effect of venom of bees, unintentional 02/07/2022   At increased risk of exposure to COVID-19 virus 02/07/2022   Asthma 11/05/2021   Fall 11/05/2021   Trigeminal neuralgia of left side of face 11/05/2021   Piriformis syndrome of right side 11/05/2021   Arthritis 11/05/2021   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   Cough 10/22/2021   Vitamin D insufficiency 10/15/2021   De Quervain's tenosynovitis, left 09/21/2021   Urinary frequency 07/25/2021   Protrusion of lumbar intervertebral disc 09/13/2019   Seasonal and perennial allergic rhinitis 08/18/2019   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   Eczema 08/02/2016   Osteoporosis without current pathological fracture 05/30/2015   Chronic right-sided low back pain with right-sided sciatica 03/31/2015   Type 2 diabetes mellitus (HCC) 11/07/2014   Diastolic CHF (HCC) 11/07/2014   Sacroiliac dysfunction 11/04/2014   LPRD (laryngopharyngeal reflux disease) 10/05/2014   Hypothyroidism 09/21/2014   Anxiety and depression 03/18/2014   Healthcare maintenance 01/25/2014   Hyperlipidemia 12/27/2013   Bilateral leg edema 07/20/2013   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/2013   Essential hypertension 07/01/2012    Past Surgical History:  Procedure Laterality Date   ABDOMINAL HYSTERECTOMY  1969  ARTERY BIOPSY N/A 11/07/2014   Procedure: BIOPSY TEMPORAL ARTERY;  Surgeon: Newman Pies, MD;  Location: [facility] OR;  Service: ENT;  Laterality: N/A;   CARPAL TUNNEL RELEASE Right    CHOLECYSTECTOMY  1976   ENDOVENOUS ABLATION SAPHENOUS VEIN W/ LASER Left 08/24/2020   endovenous laser ablation left greater saphenous vein by Cari Caraway MD   LUMBAR LAMINECTOMY/DECOMPRESSION MICRODISCECTOMY N/A 07/31/2015   Procedure: L4-5 Decompression,  Possible Right L4-5 Microdiscectomy;  Surgeon: Eldred Manges, MD;  Location: [facility] OR;  Service: Orthopedics;  Laterality: N/A;   NASAL SINUS SURGERY     "years ago"   NM MYOVIEW LTD  02/13/2010   No ischemia   PLANTAR FASCIA RELEASE     TONSILLECTOMY  1965   US ECHOCARDIOGRAPHY  09/20/2008   borderline LVH,mild TR,AOV mildly sclerotic w/ca+ of the leaflets    OB History   No obstetric history on file.      Home Medications    Prior to Admission medications   Medication Sig Start Date End Date Taking? Authorizing Provider  benzonatate (TESSALON) 100 MG capsule Take 1 capsule (100 mg total) by mouth every 8 (eight) hours as needed for cough. 06/12/22  Yes Rylah Fukuda, Rolly Salter E, FNP  acetaminophen (TYLENOL) 500 MG tablet Take 2 tablets (1,000 mg total) by mouth every 8 (eight) hours as needed for moderate pain. 11/05/21 11/05/22  Dickie La, MD  albuterol (VENTOLIN HFA) 108 (90 Base) MCG/ACT inhaler INHALE 2 PUFFS EVERY 4 HOURS AS NEEDED FOR WHEEZING OR SHORTNESS OF BREATH 05/08/22   Ambs, Norvel Richards, FNP  amLODipine-olmesartan (AZOR) 10-40 MG tablet Take 1 tablet by mouth daily. 05/17/21 05/12/22  Dellis Filbert, MD  Benralizumab (FASENRA PEN) 30 MG/ML SOAJ Inject 1 mL (30 mg total) into the skin every 8 (eight) weeks. 05/22/21   Dellis Filbert, MD  Blood Glucose Monitoring Suppl (TRUE METRIX METER) w/Device KIT Use as instructed 05/22/21   Dellis Filbert, MD  budesonide-formoterol (SYMBICORT) 160-4.5 MCG/ACT inhaler INHALE 2 PUFFS BY MOUTH INTO THE LUNGS TWICE DAILY RINSE GARGLE AND SPIT AFTER USE 04/22/22   Hetty Blend, FNP  carbamazepine (TEGRETOL XR) 200 MG 12 hr tablet TAKE 1 TABLET EVERY DAY 04/02/22   Dickie La, MD  cetirizine (ZYRTEC) 10 MG tablet Take 1 tablet (10 mg total) by mouth daily. 04/02/22   Hetty Blend, FNP  cholecalciferol (VITAMIN D3) 25 MCG (1000 UNIT) tablet Take 1 tablet (1,000 Units total) by mouth daily. 10/15/21   Masters, Florentina Addison, DO  diclofenac Sodium (VOLTAREN) 1 % GEL Apply to  small joints of the hands and wrist up to 4 times daily 11/05/21   Dickie La, MD  DULoxetine (CYMBALTA) 30 MG capsule TAKE 2 CAPSULES EVERY DAY 11/07/21   Dickie La, MD  empagliflozin (JARDIANCE) 25 MG TABS tablet Take 1 tablet (25 mg total) by mouth daily before breakfast. 03/07/22   Mapp, Gaylyn Cheers, MD  EPINEPHrine 0.3 mg/0.3 mL IJ SOAJ injection Inject 0.3 mg into the muscle as needed for anaphylaxis. 08/17/21   Cristopher Peru, PA-C  EPINEPHrine 0.3 mg/0.3 mL IJ SOAJ injection Use as directed for severe allergic reaction 02/07/22   Ambs, Norvel Richards, FNP  esomeprazole (NEXIUM) 40 MG capsule Take 1 capsule (40 mg total) by mouth daily. 01/16/22   Dickie La, MD  ezetimibe (ZETIA) 10 MG tablet Take 1 tablet (10 mg total) by mouth daily. 01/16/22   Dickie La, MD  fluticasone (FLONASE) 50 MCG/ACT nasal spray Place 2 sprays into both nostrils daily. 02/07/22  Hetty Blend, FNP  glucose blood (TRUE METRIX BLOOD GLUCOSE TEST) test strip USE AS DIRECTED 03/19/22   Dickie La, MD  hydrocortisone cream 1 % Apply to affected area 2 times daily 08/17/21   Cristopher Peru, PA-C  hydrOXYzine (ATARAX) 25 MG tablet TAKE 1 TABLET BY MOUTH AT BEDTIME FOR ANXIETY 01/11/22   Dickie La, MD  ipratropium (ATROVENT) 0.06 % nasal spray Place 2 sprays into both nostrils in the morning and at bedtime. 05/27/22   Hetty Blend, FNP  ipratropium-albuterol (DUONEB) 0.5-2.5 (3) MG/3ML SOLN Take 3 mLs by nebulization every 6 (six) hours as needed. 05/17/21   Dellis Filbert, MD  levofloxacin (LEVAQUIN) 750 MG tablet Take 1 tablet (750 mg total) by mouth daily. 03/01/22   Mapp, Gaylyn Cheers, MD  levothyroxine (SYNTHROID) 50 MCG tablet Take 1 tablet every day, at least 30-60 minutes before other food or medicine. 06/03/22   Dickie La, MD  metoprolol succinate (TOPROL XL) 50 MG 24 hr tablet Take 1 tablet (50 mg total) by mouth daily. Take with or immediately following a meal. 07/24/21 07/24/22  Steffanie Rainwater, MD  montelukast (SINGULAIR) 10 MG tablet  Take 1 tablet (10 mg total) by mouth at bedtime. 04/22/22   Ambs, Norvel Richards, FNP  Olopatadine-Mometasone (RYALTRIS) [phone] MCG/ACT SUSP Place 2 sprays into the nose 2 (two) times daily. 05/22/22   Ambs, Norvel Richards, FNP  predniSONE (DELTASONE) 10 MG tablet 2 tablets 2 times daily for 3 days, 2 tablets on the 4th day, 1 tablet on the 5th day. Patient not taking: Reported on 04/22/2022 03/01/22   Mapp, Gaylyn Cheers, MD  rosuvastatin (CRESTOR) 40 MG tablet TAKE 1 TABLET EVERY DAY (NEED MD APPOINTMENT FOR REFILLS) 01/04/22   Gust Rung, DO  spironolactone (ALDACTONE) 25 MG tablet TAKE 2 TABLETS EVERY DAY 03/25/22   Dickie La, MD  triamcinolone cream (KENALOG) 0.1 % Apply 1 application topically daily. 08/30/20   Janalyn Harder, MD  TRUEplus Lancets 33G MISC USE AS DIRECTED 04/29/22   Dickie La, MD    Family History Family History  Problem Relation Age of Onset   Dementia Mother    Gallbladder disease Mother    Diabetes Father    Heart failure Father    Hypertension Father    Sleep apnea Father    Kidney disease Father    Dementia Father    Thyroid disease Sister    Hypertension Sister    Dementia Sister    Healthy Sister     Social History Social History   Tobacco Use   Smoking status: Former    Types: Cigarettes    Quit date: 02/04/2006    Years since quitting: 16.3   Smokeless tobacco: Never  Vaping Use   Vaping Use: Never used  Substance Use Topics   Alcohol use: No    Alcohol/week: 0.0 standard drinks of alcohol   Drug use: No     Allergies   Bee venom and Penicillins   Review of Systems Review of Systems Per HPI  Physical Exam Triage Vital Signs ED Triage Vitals [06/12/22 1350]  Enc Vitals Group     BP (!) 157/81     Pulse Rate 72     Resp 16     Temp 97.8 F (36.6 C)     Temp Source Oral     SpO2 97 %     Weight      Height      Head Circumference  Peak Flow      Pain Score 0     Pain Loc      Pain Edu?      Excl. in GC?    No data found.  Updated Vital  Signs BP (!) 157/81 (BP Location: Left Arm)   Pulse 72   Temp 97.8 F (36.6 C) (Oral)   Resp 16   SpO2 97%   Visual Acuity Right Eye Distance:   Left Eye Distance:   Bilateral Distance:    Right Eye Near:   Left Eye Near:    Bilateral Near:     Physical Exam Constitutional:      General: She is not in acute distress.    Appearance: Normal appearance. She is not toxic-appearing or diaphoretic.  HENT:     Head: Normocephalic and atraumatic.     Right Ear: Tympanic membrane and ear canal normal.     Left Ear: Tympanic membrane and ear canal normal.     Nose: Congestion present.     Mouth/Throat:     Mouth: Mucous membranes are moist.     Pharynx: No posterior oropharyngeal erythema.  Eyes:     Extraocular Movements: Extraocular movements intact.     Conjunctiva/sclera: Conjunctivae normal.     Pupils: Pupils are equal, round, and reactive to light.  Cardiovascular:     Rate and Rhythm: Normal rate and regular rhythm.     Pulses: Normal pulses.     Heart sounds: Normal heart sounds.  Pulmonary:     Effort: Pulmonary effort is normal. No respiratory distress.     Breath sounds: Normal breath sounds. No stridor. No wheezing, rhonchi or rales.  Abdominal:     General: Abdomen is flat. Bowel sounds are normal.     Palpations: Abdomen is soft.  Musculoskeletal:        General: Normal range of motion.     Cervical back: Normal range of motion.  Skin:    General: Skin is warm and dry.  Neurological:     General: No focal deficit present.     Mental Status: She is alert and oriented to person, place, and time. Mental status is at baseline.  Psychiatric:        Mood and Affect: Mood normal.        Behavior: Behavior normal.      UC Treatments / Results  Labs (all labs ordered are listed, but only abnormal results are displayed) Labs Reviewed  SARS CORONAVIRUS 2 (TAT 6-24 HRS)    EKG   Radiology No results found.  Procedures Procedures (including critical  care time)  Medications Ordered in UC Medications - No data to display  Initial Impression / Assessment and Plan / UC Course  I have reviewed the triage vital signs and the nursing notes.  Pertinent labs & imaging results that were available during my care of the patient were reviewed by me and considered in my medical decision making (see chart for details).     Patient presents with symptoms likely from a viral upper respiratory infection.  Do not suspect underlying cardiopulmonary process. Symptoms seem unlikely related to ACS, CHF or COPD exacerbations, pneumonia, pneumothorax. Patient is nontoxic appearing and not in need of emergent medical intervention.  COVID test pending.  High suspicion for COVID-19 given possible exposure.  Recommended symptom control with medications and supportive care.  Benzonatate prescribed to take as needed for cough.  Patient may qualify for molnupiravir if COVID test is positive.  Return if symptoms fail to improve. Patient states understanding and is agreeable.  Discharged with PCP followup.  Final Clinical Impressions(s) / UC Diagnoses   Final diagnoses:  Close exposure to COVID-19 virus  Viral upper respiratory tract infection with cough     Discharge Instructions      COVID test is pending.  Will call if it is positive.  I have prescribed you a cough medication to take as needed.  Follow-up if any symptoms persist or worsen.    ED Prescriptions     Medication Sig Dispense Auth. Provider   benzonatate (TESSALON) 100 MG capsule Take 1 capsule (100 mg total) by mouth every 8 (eight) hours as needed for cough. 21 capsule [place], Acie Fredrickson, [place]      PDMP not reviewed this encounter.   Gustavus Bryant, [place] 06/12/22 [phone]

## 2022-06-12 NOTE — ED Triage Notes (Signed)
Pt c/o covid exposure requesting covid test.

## 2022-06-12 NOTE — Discharge Instructions (Addendum)
COVID test is pending.  Will call if it is positive.  I have prescribed you a cough medication to take as needed.  Follow-up if any symptoms persist or worsen.

## 2022-06-13 LAB — SARS CORONAVIRUS 2 (TAT 6-24 HRS): SARS Coronavirus 2: NEGATIVE

## 2022-06-14 ENCOUNTER — Other Ambulatory Visit: Payer: Self-pay | Admitting: Internal Medicine

## 2022-06-17 ENCOUNTER — Ambulatory Visit (INDEPENDENT_AMBULATORY_CARE_PROVIDER_SITE_OTHER): Payer: Medicare HMO | Admitting: Internal Medicine

## 2022-06-17 ENCOUNTER — Encounter: Payer: Self-pay | Admitting: Internal Medicine

## 2022-06-17 VITALS — BP 138/57 | HR 71 | Temp 98.1°F | Ht 66.0 in | Wt 189.9 lb

## 2022-06-17 DIAGNOSIS — M810 Age-related osteoporosis without current pathological fracture: Secondary | ICD-10-CM

## 2022-06-17 DIAGNOSIS — M5126 Other intervertebral disc displacement, lumbar region: Secondary | ICD-10-CM

## 2022-06-17 DIAGNOSIS — I110 Hypertensive heart disease with heart failure: Secondary | ICD-10-CM

## 2022-06-17 DIAGNOSIS — I5030 Unspecified diastolic (congestive) heart failure: Secondary | ICD-10-CM

## 2022-06-17 DIAGNOSIS — I10 Essential (primary) hypertension: Secondary | ICD-10-CM

## 2022-06-17 DIAGNOSIS — E559 Vitamin D deficiency, unspecified: Secondary | ICD-10-CM

## 2022-06-17 DIAGNOSIS — E039 Hypothyroidism, unspecified: Secondary | ICD-10-CM

## 2022-06-17 DIAGNOSIS — G500 Trigeminal neuralgia: Secondary | ICD-10-CM

## 2022-06-17 DIAGNOSIS — E785 Hyperlipidemia, unspecified: Secondary | ICD-10-CM | POA: Diagnosis not present

## 2022-06-17 DIAGNOSIS — J4540 Moderate persistent asthma, uncomplicated: Secondary | ICD-10-CM

## 2022-06-17 DIAGNOSIS — M5441 Lumbago with sciatica, right side: Secondary | ICD-10-CM

## 2022-06-17 DIAGNOSIS — Z5181 Encounter for therapeutic drug level monitoring: Secondary | ICD-10-CM

## 2022-06-17 DIAGNOSIS — E1165 Type 2 diabetes mellitus with hyperglycemia: Secondary | ICD-10-CM | POA: Diagnosis not present

## 2022-06-17 DIAGNOSIS — E041 Nontoxic single thyroid nodule: Secondary | ICD-10-CM

## 2022-06-17 DIAGNOSIS — F330 Major depressive disorder, recurrent, mild: Secondary | ICD-10-CM

## 2022-06-17 DIAGNOSIS — Z87891 Personal history of nicotine dependence: Secondary | ICD-10-CM

## 2022-06-17 DIAGNOSIS — M778 Other enthesopathies, not elsewhere classified: Secondary | ICD-10-CM

## 2022-06-17 LAB — POCT GLYCOSYLATED HEMOGLOBIN (HGB A1C): Hemoglobin A1C: 7.2 % — AB (ref 4.0–5.6)

## 2022-06-17 LAB — GLUCOSE, CAPILLARY: Glucose-Capillary: 161 mg/dL — ABNORMAL HIGH (ref 70–99)

## 2022-06-17 NOTE — Assessment & Plan Note (Signed)
BP 138/57. No changes today.  Plan -Continue amlodipine-olmesartan 10-40 mg daily -Continue spironolactone 50 mg daily -Metoprolol XL 50 mg daily -BMP today

## 2022-06-17 NOTE — Assessment & Plan Note (Signed)
Started on vitamin D 1000 IU daily 10/2021. History of osteoporosis.  Plan -Recheck vitamin D today

## 2022-06-17 NOTE — Assessment & Plan Note (Signed)
Improved A1c on empagliflozin 25 mg daily. 7.2%. Not checking BG at home. UTD on foot exam, due for eye exam. No microalbuminuria 02/2022.  Plan -Continue empagliflozin 25 mg daily -Pt to call Dr. Dione Booze for DM eye exam -F/u 3 months

## 2022-06-17 NOTE — Assessment & Plan Note (Addendum)
Continues to have left-sided facial pain attacks 2-3x/day when not taking carbamazepine for the past two weeks. We discussed continuing for now. Check LFTs today.

## 2022-06-17 NOTE — Patient Instructions (Addendum)
It was wonderful to see you today!  Diabetes is in a good spot. Continue your current medication.  Your exam is concerning for right rotator cuff inflammation. Continue to take Tylenol 1000 mg every 8 hours and use Voltaren gel up to four times a day. We have discussed increasing duloxetine (Cymb alta) to 60 mg (two capsules) daily. I have sent a referral to physical therapy. I have also sent a referral to see Dr. Sherlean Foot for lower back pain/sciatica. If you are interested in a steroid injection for your shoulder, please let me know.  You will be contacted about scheduling the bone density test.  Please schedule an eye exam with Dr. Dione Booze for diabetes screening.  Please contact your pharmacy about scheduling the shingles (Shingrix) and updated COVID-19 vaccines.   We are checking several labs today, I will call you with the results when available.

## 2022-06-17 NOTE — Assessment & Plan Note (Addendum)
Longstanding history of lower back pain with diagnoses of right sacroiliac dysfunction and L4-L5 stenosis with right herniated disc s/p decompression in 2017. MRI 07/2019 with lumbar spondylosis, L4-L5 progressive advanced disc degeneration, new disc herniations, moderate central canal stenosis, moderate right neural foraminal narrowing and L5-S1 disc bulge with subarticular stenosis and mild central canal stenosis. Previously managed with multiple injections through orthopedics and PM&R. Today, Andrea Lowery notes one month of worsening right lower back with radiation into the buttock and down the leg. Exam notable for TTP over the right sacroiliac joint, positive straight leg on R. No midline TTP. Strength 5/5 in hip flexion, leg extension and flexion. Denies incontinence, saddle anesthesia. Consistent with right sided lumbar radiculopathy vs sacroiliac dysfunction. Some benefit from prior injections per chart review. Currently only taking duloxetine 30 mg.   Plan -Referral to orthopedics, Dr. Sherlean Foot per patient request, for consideration of further injections vs surgical intervention -Referral to PT -Continue Tylenol 1000 mg q8h -Increase Cymbalta to 60 mg daily

## 2022-06-17 NOTE — Assessment & Plan Note (Addendum)
PHQ9 8 today, consistent with mild depression. Remains on duloxetine 30 mg daily per patient report, did not go up to 60 mg as previously recommended. Not previously interested in counseling, although will discuss referral to [facility] at f/u. 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  Plan -Duloxetine 60 mg daily -Consider referral to [facility]  Addendum 5/14 Discussed referral to [facility] to which Andrea Lowery was agreeable. Referral placed today.

## 2022-06-17 NOTE — Assessment & Plan Note (Signed)
Follows with allergy/immunology. Not currently using Symbicort daily, we discussed the importance of this. Also on montelukast, Fasenra q8wks, and albuterol prn. Continues nasal spray and oral antihistamine for allergies. Lung exam wnl today.  Plan -Continue Symbicort daily w/ albuterol prn -F/u with allergy

## 2022-06-17 NOTE — Assessment & Plan Note (Signed)
Previously managed through endocrinology, Dr. Shawnee Knapp. Last seen 2020, serial [place] for thyroid nodules completed every 1-2 years with last in-office in 2018. Dominant left-sided nodule with benign FNA in 2016.  Plan -Continue levothyroxine 50 mcg, check TSH -Thyroid US to follow nodules

## 2022-06-17 NOTE — Progress Notes (Signed)
Established Patient Office Visit  Subjective   Patient ID: Andrea Lowery, female    DOB: 15-Dec-1940  Age: 82 y.o. MRN: [id number]  Chief Complaint  Patient presents with   Follow-up    4 months for T2DM and HTN   Hip Pain    Right x 6 weeks   Shoulder Pain    Right x 1 week    Andrea Lowery returns to clinic today to discuss f/u of chronic conditions as well as right shoulder and right lower back pain. Please see assessment/plan in problem-based charting for further details of today's visit.    Patient Active Problem List   Diagnosis Date Noted   Right shoulder tendinitis 06/17/2022   Seasonal allergic rhinitis due to pollen 04/22/2022   Seasonal allergic conjunctivitis 04/22/2022   Moderate persistent asthma 11/05/2021   Trigeminal neuralgia of left side of face 11/05/2021   Arthritis 11/05/2021   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   Vitamin D insufficiency 10/15/2021   De Quervain's tenosynovitis, left 09/21/2021   Seasonal and perennial allergic rhinitis 08/18/2019   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   Eczema 08/02/2016   Osteoporosis without current pathological fracture 05/30/2015   Chronic right-sided low back pain with right-sided sciatica 03/31/2015   Type 2 diabetes mellitus (HCC) 11/07/2014   Diastolic CHF (HCC) 11/07/2014   Sacroiliac dysfunction 11/04/2014   LPRD (laryngopharyngeal reflux disease) 10/05/2014   Hypothyroidism 09/21/2014   Thyroid nodule 07/01/2014   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   Healthcare maintenance 01/25/2014   Hyperlipidemia 12/27/2013   Bilateral leg edema 07/20/2013   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/2013   Essential hypertension 07/01/2012      Objective:     BP (!) 138/57 (BP Location: Right Arm, Cuff Size: Normal)   Pulse 71   Temp 98.1 F (36.7 C) (Oral)   Ht 5\' 6"  (1.676 m)   Wt 189 lb 14.4 oz (86.1 kg)   SpO2 98%   BMI 30.65 kg/m  BP Readings from Last 3 Encounters:   06/17/22 (!) 138/57  06/12/22 (!) 157/81  04/22/22 (!) 160/80   Wt Readings from Last 3 Encounters:  06/17/22 189 lb 14.4 oz (86.1 kg)  04/22/22 195 lb 9.6 oz (88.7 kg)  03/01/22 192 lb 3.2 oz (87.2 kg)     Physical Exam Vitals reviewed.  Constitutional:      Appearance: Normal appearance. She is normal weight. She is not ill-appearing.  Cardiovascular:     Rate and Rhythm: Normal rate and regular rhythm.  Pulmonary:     Effort: Pulmonary effort is normal. No respiratory distress.  Musculoskeletal:     Right lower leg: Edema present.     Left lower leg: Edema present.     Comments: Right shoulder: ROM limited due to pain, able to reach behind the back; pain with manipulation in all directions; 5/5 strength with resisted internal/external rotation at 90 degrees, forward flexion; + Hawkins  Right lower back: no midline tenderness; TTP over right SI joint; positive straight leg; 5/5 strength in hip flexion, leg extension/flexion  Neurological:     General: No focal deficit present.     Mental Status: She is alert.     Gait: Gait normal.  Psychiatric:        Mood and Affect: Mood normal.        Behavior: Behavior normal.      Results for orders placed or performed in visit on 06/17/22  Glucose, capillary  Result Value Ref Range  Glucose-Capillary 161 (H) 70 - 99 mg/dL  POC Hbg X9J  Result Value Ref Range   Hemoglobin A1C 7.2 (A) 4.0 - 5.6 %   HbA1c POC (<> result, manual entry)     HbA1c, POC (prediabetic range)     HbA1c, POC (controlled diabetic range)        Assessment & Plan:   Problem List Items Addressed This Visit       Cardiovascular and Mediastinum   Essential hypertension    BP 138/57. No changes today.  Plan -Continue amlodipine-olmesartan 10-40 mg daily -Continue spironolactone 50 mg daily -Metoprolol XL 50 mg daily -BMP today      Relevant Orders   CMP14 + Anion Gap   POC Hbg A1C (Completed)   Diastolic CHF (HCC)    Chronic and stable.  Continue ARB, BB, AA.        Respiratory   Moderate persistent asthma    Follows with allergy/immunology. Not currently using Symbicort daily, we discussed the importance of this. Also on montelukast, Fasenra q8wks, and albuterol prn. Continues nasal spray and oral antihistamine for allergies. Lung exam wnl today.  Plan -Continue Symbicort daily w/ albuterol prn -F/u with allergy        Endocrine   Type 2 diabetes mellitus (HCC)    Improved A1c on empagliflozin 25 mg daily. 7.2%. Not checking BG at home. UTD on foot exam, due for eye exam. No microalbuminuria 02/2022.  Plan -Continue empagliflozin 25 mg daily -Pt to call Dr. Dione Booze for DM eye exam -F/u 3 months      Relevant Orders   POC Hbg A1C (Completed)   Glucose, capillary (Completed)   Hypothyroidism - Primary    Previously managed through endocrinology, Dr. Shawnee Knapp. Last seen 2020, serial [place] for thyroid nodules completed every 1-2 years with last in-office in 2018. Dominant left-sided nodule with benign FNA in 2016.  Plan -Continue levothyroxine 50 mcg, check TSH -Thyroid US to follow nodules      Relevant Orders   TSH   Thyroid nodule    Previously managed through endocrinology, Dr. Shawnee Knapp. Last seen 2020, serial [place] for thyroid nodules completed every 1-2 years with last in-office in 2018. Dominant left-sided nodule with benign FNA in 2016.   Plan -Continue levothyroxine 50 mcg, check TSH -Thyroid US to follow nodules      Relevant Orders   US THYROID     Nervous and Auditory   Chronic right-sided low back pain with right-sided sciatica    Longstanding history of lower back pain with diagnoses of right sacroiliac dysfunction and L4-L5 stenosis with right herniated disc s/p decompression in 2017. MRI 07/2019 with lumbar spondylosis, L4-L5 progressive advanced disc degeneration, new disc herniations, moderate central canal stenosis, moderate right neural foraminal narrowing and L5-S1 disc bulge with subarticular  stenosis and mild central canal stenosis. Previously managed with multiple injections through orthopedics and PM&R. Today, Andrea Lowery notes one month of worsening right lower back with radiation into the buttock and down the leg. Exam notable for TTP over the right sacroiliac joint, positive straight leg on R. No midline TTP. Strength 5/5 in hip flexion, leg extension and flexion. Denies incontinence, saddle anesthesia. Consistent with right sided lumbar radiculopathy vs sacroiliac dysfunction. Some benefit from prior injections per chart review. Currently only taking duloxetine 30 mg.   Plan -Referral to orthopedics, Dr. Sherlean Foot per patient request, for consideration of further injections vs surgical intervention -Referral to PT -Continue Tylenol 1000 mg q8h -Increase Cymbalta to 60  mg daily           Trigeminal neuralgia of left side of face    Continues to have left-sided facial pain attacks 2-3x/day when not taking carbamazepine. We discussed continuing for now. Check LFTs today.        Musculoskeletal and Integument   Osteoporosis without current pathological fracture    Repeat DEXA following drug holiday ordered at prior visit, not yet completed. Referral coordinator notes the imaging center has attempted to contact her for scheduling without response, letter to be mailed today with scheduling information.  Plan -Vitamin D -DEXA pending      Right shoulder tendinitis    One week of right shoulder pain. No trauma. Similar pain on the left side in the past. Exam notable for limited ROM on abduction and flexion due to pain although able to internally rotate arm to behind the back. Strength intact with resisted forward flexion, external and internal rotation. Positive Hawkins and empty can. Concern for rotator cuff tendinopathy, lower suspicion for tear given preserved strength. Discussed pain management with Tylenol and Voltaren gel, would like to avoid NSAIDs given comorbidities.  Referred for physical therapy. Also referred to orthopedics for lumbar spine/sciatica evaluation, although advised that if she becomes interested in a shoulder injection she may also schedule with our office.       Relevant Orders   Ambulatory referral to Physical Therapy     Other   Mild recurrent major depression (HCC)    PHQ9 8 today, consistent with mild depression. Remains on duloxetine 30 mg daily per patient report, did not go up to 60 mg as previously recommended. Not previously interested in counseling, although will discuss referral to IBH at f/u. 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  Plan -Duloxetine 60 mg daily -Consider referral to IBH      Hyperlipidemia    Ezetimibe added 11/2021 for persistent hyperlipidemia on rosuvastatin 40 mg. Goal LDL <70 for secondary prevention in the setting of previously documented TIA.  Plan -Lipid panel -Continue statin, ezetimibe      Relevant Orders   Lipid Profile   Vitamin D insufficiency    Started on vitamin D 1000 IU daily 10/2021. History of osteoporosis.  Plan -Recheck vitamin D today      Relevant Orders   Vitamin D (25 hydroxy)   Other Visit Diagnoses     Medication monitoring encounter       Relevant Orders   CMP14 + Anion Gap   Protrusion of lumbar intervertebral disc       Relevant Orders   Ambulatory referral to Orthopedic Surgery       Return in about 3 months (around 09/17/2022).    Dickie La, MD

## 2022-06-17 NOTE — Assessment & Plan Note (Signed)
Chronic and stable. Continue ARB, BB, AA.

## 2022-06-17 NOTE — Assessment & Plan Note (Signed)
Ezetimibe added 11/2021 for persistent hyperlipidemia on rosuvastatin 40 mg. Goal LDL <70 for secondary prevention in the setting of previously documented TIA.  Plan -Lipid panel -Continue statin, ezetimibe

## 2022-06-17 NOTE — Assessment & Plan Note (Signed)
Previously managed through endocrinology, Dr. Levy. Last seen 2020, serial US for thyroid nodules completed every 1-2 years with last in-office in 2018. Dominant left-sided nodule with benign FNA in 2016.  Plan -Continue levothyroxine 50 mcg, check TSH -Thyroid US to follow nodules 

## 2022-06-17 NOTE — Assessment & Plan Note (Signed)
Repeat DEXA following drug holiday ordered at prior visit, not yet completed. Referral coordinator notes the imaging center has attempted to contact her for scheduling without response, letter to be mailed today with scheduling information.  Plan -Vitamin D -DEXA pending

## 2022-06-17 NOTE — Assessment & Plan Note (Signed)
One week of right shoulder pain. No trauma. Similar pain on the left side in the past. Exam notable for limited ROM on abduction and flexion due to pain although able to internally rotate arm to behind the back. Strength intact with resisted forward flexion, external and internal rotation. Positive Hawkins and empty can. Concern for rotator cuff tendinopathy, lower suspicion for tear given preserved strength. Discussed pain management with Tylenol and Voltaren gel, would like to avoid NSAIDs given comorbidities. Referred for physical therapy. Also referred to orthopedics for lumbar spine/sciatica evaluation, although advised that if she becomes interested in a shoulder injection she may also schedule with our office.

## 2022-06-18 LAB — CMP14 + ANION GAP
ALT: 38 IU/L — ABNORMAL HIGH (ref 0–32)
AST: 44 IU/L — ABNORMAL HIGH (ref 0–40)
Albumin/Globulin Ratio: 1.6 (ref 1.2–2.2)
Albumin: 4.5 g/dL (ref 3.8–4.8)
Alkaline Phosphatase: 112 IU/L (ref 44–121)
Anion Gap: 15 mmol/L (ref 10.0–18.0)
BUN/Creatinine Ratio: 14 (ref 12–28)
BUN: 11 mg/dL (ref 8–27)
Bilirubin Total: 0.4 mg/dL (ref 0.0–1.2)
CO2: 23 mmol/L (ref 20–29)
Calcium: 9.6 mg/dL (ref 8.7–10.3)
Chloride: 101 mmol/L (ref 96–106)
Creatinine, Ser: 0.77 mg/dL (ref 0.57–1.00)
Globulin, Total: 2.8 g/dL (ref 1.5–4.5)
Glucose: 144 mg/dL — ABNORMAL HIGH (ref 70–99)
Potassium: 4.1 mmol/L (ref 3.5–5.2)
Sodium: 139 mmol/L (ref 134–144)
Total Protein: 7.3 g/dL (ref 6.0–8.5)
eGFR: 78 mL/min/{1.73_m2} (ref >59)

## 2022-06-18 LAB — VITAMIN D 25 HYDROXY (VIT D DEFICIENCY, FRACTURES): Vit D, 25-Hydroxy: 19.5 ng/mL — ABNORMAL LOW (ref 30.0–100.0)

## 2022-06-18 LAB — LIPID PANEL
Chol/HDL Ratio: 5.4 ratio — ABNORMAL HIGH (ref 0.0–4.4)
Cholesterol, Total: 255 mg/dL — ABNORMAL HIGH (ref 100–199)
HDL: 47 mg/dL (ref >39)
LDL Chol Calc (NIH): 183 mg/dL — ABNORMAL HIGH (ref 0–99)
Triglycerides: 139 mg/dL (ref 0–149)
VLDL Cholesterol Cal: 25 mg/dL (ref 5–40)

## 2022-06-18 LAB — TSH: TSH: 6.23 u[IU]/mL — ABNORMAL HIGH (ref 0.450–4.500)

## 2022-06-18 MED ORDER — CARBAMAZEPINE ER 200 MG PO TB12: 200.0000 mg | ORAL_TABLET | Freq: Every day | ORAL | 0 refills | Status: DC

## 2022-06-18 MED ORDER — VITAMIN D (ERGOCALCIFEROL) 1.25 MG (50000 UNIT) PO CAPS
50000.0000 [IU] | ORAL_CAPSULE | ORAL | 0 refills | Status: DC
Start: 2022-06-18 — End: 2022-09-17

## 2022-06-18 NOTE — Addendum Note (Signed)
Addended by: Dickie La on: 06/18/2022 11:40 AM   Modules accepted: Orders

## 2022-06-18 NOTE — Progress Notes (Signed)
Cholesterol significantly elevated, LDL high. Andrea Lowery reports missing several of her medications a few times/week, including cholesterol therapy. We discussed importance of regular adherence, as well as healthy dietary changes. She will work on med adherence and lifestyle, repeat at f/u visit.

## 2022-06-18 NOTE — Progress Notes (Signed)
Vitamin D low on 1000 IU daily supplementation. History of osteoporosis. Will start high dose vitamin D for 8 weeks.

## 2022-06-18 NOTE — Progress Notes (Signed)
Mild elevation in TSH. Currently taking levothyroxine with other medications, discussed appropriate intake without other medications or food. Also missing doses 1-2x/week. Will work on adherence and appropriate administration, recheck at f/u.

## 2022-06-28 ENCOUNTER — Ambulatory Visit (INDEPENDENT_AMBULATORY_CARE_PROVIDER_SITE_OTHER): Payer: Medicare HMO | Admitting: Allergy

## 2022-06-28 DIAGNOSIS — J309 Allergic rhinitis, unspecified: Secondary | ICD-10-CM

## 2022-07-02 ENCOUNTER — Other Ambulatory Visit: Payer: Self-pay

## 2022-07-03 ENCOUNTER — Ambulatory Visit (INDEPENDENT_AMBULATORY_CARE_PROVIDER_SITE_OTHER): Payer: Medicare HMO | Admitting: Student

## 2022-07-03 ENCOUNTER — Ambulatory Visit (INDEPENDENT_AMBULATORY_CARE_PROVIDER_SITE_OTHER): Payer: Medicare HMO

## 2022-07-03 ENCOUNTER — Encounter: Payer: Self-pay | Admitting: Student

## 2022-07-03 VITALS — BP 143/71 | HR 79 | Temp 97.7°F | Ht 66.0 in | Wt 193.8 lb

## 2022-07-03 DIAGNOSIS — L308 Other specified dermatitis: Secondary | ICD-10-CM

## 2022-07-03 DIAGNOSIS — E1169 Type 2 diabetes mellitus with other specified complication: Secondary | ICD-10-CM

## 2022-07-03 DIAGNOSIS — J309 Allergic rhinitis, unspecified: Secondary | ICD-10-CM | POA: Diagnosis not present

## 2022-07-03 DIAGNOSIS — F4329 Adjustment disorder with other symptoms: Secondary | ICD-10-CM

## 2022-07-03 MED ORDER — HYDROXYZINE HCL 25 MG PO TABS: ORAL_TABLET | ORAL | 0 refills | Status: DC

## 2022-07-03 MED ORDER — HYDROCORTISONE 1 % EX CREA
TOPICAL_CREAM | Freq: Two times a day (BID) | CUTANEOUS | 1 refills | Status: AC
Start: 2022-07-03 — End: 2022-07-17

## 2022-07-03 NOTE — Telephone Encounter (Signed)
I called pt - no answer; left message to call the office or Dr West Carbo' office.

## 2022-07-03 NOTE — Patient Instructions (Addendum)
Thank you, Ms.[name] for allowing [place] to provide your care today. Today we discussed your scalp and neck itchiness  Please pick up the hydrocortisone cream and apply once to twice daily until three to five days after clearance or up to two weeks. If iin two weeks does not get better, please come back for further evaluation. Apply to your forehead and your neck.  You should also look for oatmeal shampoos to help retain moisture. A good example is this:    I have ordered the following labs for you:  Lab Orders  No laboratory test(s) ordered today     I will call if any are abnormal. All of your labs can be accessed through "My Chart".   My Chart Access: [email or web address]   We look forward to seeing you next time. Please call our clinic at [phone] if you have any questions or concerns. The best time to call is Monday-Friday from 9am-4pm, but there is someone available 24/7. If after hours or the weekend, call the main hospital number and ask for the Internal Medicine Resident On-Call. If you need medication refills, please notify your pharmacy one week in advance and they will send [place] a request.   Thank you for letting us take part in your care. Wishing you the best!  Morene Crocker, MD 07/03/2022, 2:38 PM [facility] Internal Medicine Resident, PGY-1

## 2022-07-03 NOTE — Telephone Encounter (Signed)
Is the pharmacy correct for this medication? Thank you

## 2022-07-03 NOTE — Progress Notes (Signed)
Subjective:  CC: Itchy neck, scalp, and forehead  HPI:  [name] is a 82 y.o. female with a past medical history significant for eczema presents today for worsening pruritus in her forehead, neck, and scalp. Please see problem based assessment and plan for additional details.  Past Medical History:  Diagnosis Date   Arthritis    Asthma    Asthma 01/10/2016   Last Assessment & Plan:  After recent ED eval- currrently utilizing prednisone as prescribed. Begin Levaquin, prednisone and Albuterol MDI to be utilized as needed for acute wheezing FU in 2 day is no improvement or worsening of symptoms. Pt verbalizes understanding for current treatment plan. Follow-up in one week. Plan to increase chronic inhaled steroid dose to assess further with prevention of   Diabetes mellitus without complication (HCC)    Diabetes II   Difficulty sleeping 12/28/2013   Last Assessment & Plan:  Stable with trazodone nightly. Continue current treatment plan. Refill trazodone today. Tolerating medication well without side effects. Return in 3 months for follow up or sooner if any worsening of symptoms.    DOE (dyspnea on exertion) 07/01/2012   DVT (deep venous thrombosis) (HCC)    "Years ago"   Dyslipidemia    Eczema    GERD (gastroesophageal reflux disease)    Headache    Migraine   Hypertension    Hypokalemia 04/30/2016   Hypothyroidism    LBBB (left bundle branch block)    MGUS (monoclonal gammopathy of unknown significance)    MGUS (monoclonal gammopathy of unknown significance)    Migraine    OSA on CPAP    Sinus infection    had sinus surgery   Urinary frequency 07/25/2021    Current Outpatient Medications on File Prior to Visit  Medication Sig Dispense Refill   acetaminophen (TYLENOL) 500 MG tablet Take 2 tablets (1,000 mg total) by mouth every 8 (eight) hours as needed for moderate pain. 100 tablet 2   albuterol (VENTOLIN HFA) 108 (90 Base) MCG/ACT inhaler INHALE 2 PUFFS  EVERY 4 HOURS AS NEEDED FOR WHEEZING OR SHORTNESS OF BREATH 3 each 1   amLODipine-olmesartan (AZOR) 10-40 MG tablet TAKE 1 TABLET EVERY DAY 90 tablet 3   Benralizumab (FASENRA PEN) 30 MG/ML SOAJ Inject 1 mL (30 mg total) into the skin every 8 (eight) weeks. 1 mL 6   Blood Glucose Monitoring Suppl (TRUE METRIX METER) w/Device KIT Use as instructed 1 kit 1   budesonide-formoterol (SYMBICORT) 160-4.5 MCG/ACT inhaler INHALE 2 PUFFS BY MOUTH INTO THE LUNGS TWICE DAILY RINSE GARGLE AND SPIT AFTER USE 30.6 g 1   carbamazepine (TEGRETOL XR) 200 MG 12 hr tablet Take 1 tablet (200 mg total) by mouth daily. 90 tablet 0   cetirizine (ZYRTEC) 10 MG tablet Take 1 tablet (10 mg total) by mouth daily. 30 tablet 5   diclofenac Sodium (VOLTAREN) 1 % GEL Apply to small joints of the hands and wrist up to 4 times daily 100 g 2   DULoxetine (CYMBALTA) 30 MG capsule TAKE 2 CAPSULES EVERY DAY 180 capsule 3   empagliflozin (JARDIANCE) 25 MG TABS tablet Take 1 tablet (25 mg total) by mouth daily before breakfast. 60 tablet 2   EPINEPHrine 0.3 mg/0.3 mL IJ SOAJ injection Use as directed for severe allergic reaction 3 each 1   esomeprazole (NEXIUM) 40 MG capsule Take 1 capsule (40 mg total) by mouth daily. 90 capsule 3   ezetimibe (ZETIA) 10 MG tablet Take 1 tablet (10 mg  total) by mouth daily. 30 tablet 11   fluticasone (FLONASE) 50 MCG/ACT nasal spray Place 2 sprays into both nostrils daily. 48 g 1   glucose blood (TRUE METRIX BLOOD GLUCOSE TEST) test strip USE AS DIRECTED 300 strip 3   ipratropium-albuterol (DUONEB) 0.5-2.5 (3) MG/3ML SOLN Take 3 mLs by nebulization every 6 (six) hours as needed. 360 mL 2   levothyroxine (SYNTHROID) 50 MCG tablet Take 1 tablet every day, at least 30-60 minutes before other food or medicine. 90 tablet 0   metoprolol succinate (TOPROL XL) 50 MG 24 hr tablet Take 1 tablet (50 mg total) by mouth daily. Take with or immediately following a meal. 30 tablet 11   montelukast (SINGULAIR) 10 MG  tablet Take 1 tablet (10 mg total) by mouth at bedtime. 90 tablet 1   Olopatadine-Mometasone (RYALTRIS) 665-25 MCG/ACT SUSP Place 2 sprays into the nose 2 (two) times daily. 29 g 2   rosuvastatin (CRESTOR) 40 MG tablet TAKE 1 TABLET EVERY DAY (NEED MD APPOINTMENT FOR REFILLS) 90 tablet 3   spironolactone (ALDACTONE) 25 MG tablet TAKE 2 TABLETS EVERY DAY 60 tablet 11   TRUEplus Lancets 33G MISC USE AS DIRECTED 300 each 3   Vitamin D, Ergocalciferol, (DRISDOL) 1.25 MG (50000 UNIT) CAPS capsule Take 1 capsule (50,000 Units total) by mouth every 7 (seven) days. 8 capsule 0   Current Facility-Administered Medications on File Prior to Visit  Medication Dose Route Frequency Provider Last Rate Last Admin   Benralizumab SOSY 30 mg  30 mg Subcutaneous Q8 Weeks Kozlow, Alvira Philips, MD   30 mg at 05/27/22 1351    Family History  Problem Relation Age of Onset   Dementia Mother    Gallbladder disease Mother    Diabetes Father    Heart failure Father    Hypertension Father    Sleep apnea Father    Kidney disease Father    Dementia Father    Thyroid disease Sister    Hypertension Sister    Dementia Sister    Healthy Sister     Social History   Socioeconomic History   Marital status: Divorced    Spouse name: Not on file   Number of children: Not on file   Years of education: Not on file   Highest education level: Not on file  Occupational History   Not on file  Tobacco Use   Smoking status: Former    Types: Cigarettes    Quit date: 02/04/2006    Years since quitting: 16.4   Smokeless tobacco: Never  Vaping Use   Vaping Use: Never used  Substance and Sexual Activity   Alcohol use: No    Alcohol/week: 0.0 standard drinks of alcohol   Drug use: No   Sexual activity: Not on file  Other Topics Concern   Not on file  Social History Narrative   Lives with sister in a one story home.  Has one child.  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     Right handed   Caffeine: 24 oz/day of pepsi   Social Determinants  of Health   Financial Resource Strain: Low Risk  (12/05/2021)   Overall Financial Resource Strain (CARDIA)    Difficulty of Paying Living Expenses: Not hard at all  Food Insecurity: Food Insecurity Present (12/05/2021)   Hunger Vital Sign    Worried About Running Out of Food in the Last Year: Never true    Ran Out of Food in the Last Year: Sometimes true  Transportation Needs: No Transportation  Needs (12/05/2021)   PRAPARE - Administrator, Civil Service (Medical): No    Lack of Transportation (Non-Medical): No  Physical Activity: Insufficiently Active (12/05/2021)   Exercise Vital Sign    Days of Exercise per Week: 3 days    Minutes of Exercise per Session: 40 min  Stress: No Stress Concern Present (12/05/2021)   [employer] of Occupational Health - Occupational Stress Questionnaire    Feeling of Stress : Not at all  Social Connections: Socially Isolated (12/05/2021)   Social Connection and Isolation Panel [NHANES]    Frequency of Communication with Friends and Family: Once a week    Frequency of Social Gatherings with Friends and Family: Once a week    Attends Religious Services: 1 to 4 times per year    Active Member of [employer] or Organizations: No    Attends Banker Meetings: Never    Marital Status: Divorced  Catering manager Violence: Not At Risk (12/05/2021)   Humiliation, Afraid, Rape, and Kick questionnaire    Fear of Current or Ex-Partner: No    Emotionally Abused: No    Physically Abused: No    Sexually Abused: No    Review of Systems: ROS negative except for what is noted on the assessment and plan.  Objective:   Vitals:   07/03/22 1343 07/03/22 1409  BP: (!) 166/81 (!) 143/71  Pulse: 81 79  Temp: 97.7 F (36.5 C)   TempSrc: Oral   SpO2: 99%   Weight: 193 lb 12.8 oz (87.9 kg)   Height: 5\' 6"  (1.676 m)     Physical Exam: Constitutional: well-appearing elderly woman sitting in chair, in no acute distress HENT: normocephalic  atraumatic, mucous membranes moist Eyes: conjunctiva non-erythematous Neck: supple Cardiovascular: regular rate and rhythm, no m/r/g Pulmonary/Chest: normal work of breathing on room air, lungs clear to auscultation bilaterally Abdominal: soft, non-tender, non-distended MSK: normal bulk and tone Neurological: alert & oriented x 3 Skin no evidence of rash, escoriations, hair thinning, hyperpigmentation, trauma, louse, dryness, or plaques on scalp or forehead. There was evidenced of mild. white, dry scales with mild erythema on the back of the neck with mild escoriations present Psych: Pleasant mood and affect       07/03/2022    1:49 PM  Depression screen PHQ 2/9  Decreased Interest 0  Down, Depressed, Hopeless 0  PHQ - 2 Score 0  Altered sleeping 0  Tired, decreased energy 0  Change in appetite 0  Feeling bad or failure about yourself  0  Trouble concentrating 0  Moving slowly or fidgety/restless 0  Suicidal thoughts 0  PHQ-9 Score 0  Difficult doing work/chores Not difficult at all        No data to display           Assessment & Plan:   Eczema Patient presenting with intense pruritus of the forehead, scalp, and back of neck for the past three days. It has been a long time since her eczema flared and she is lost to follow up with her dermatologist who has since retired.  Patient denies new soaps, shampoo, hair product, foods or medications. Denies anaphylactic like symptoms. She is around children but they do not have the same symptoms nor has she seen lice or any other animal crawling in her head. Denies problems with dandruff.   On exam: no evidence of rash, escoriations, hair thinning, hyperpigmentation, trauma, louse, dryness, or plaques on scalp or forehead. There was evidenced of mild.  white, dry scales with mild erythema on the back of the neck and along the hair line with mild escoriations present.  Suspect the dry patches on her neck and forehead are in the  setting of an eczema flare. Will trial 1% hydrocortisone and recommend oatmeal gentle shampoo for scalp to attempt to ameliorate symptoms. Advised return to clinic if no improvement.    Return if symptoms worsen or fail to improve.  Patient seen with Dr. Olene Floss, MD [facility] Internal Medicine Program - PGY-1 07/03/2022, 5:18 PM

## 2022-07-03 NOTE — Assessment & Plan Note (Deleted)
Type 2 DM associated with HTN

## 2022-07-03 NOTE — Assessment & Plan Note (Signed)
Patient presenting with intense pruritus of the forehead, scalp, and back of neck for the past three days. It has been a long time since her eczema flared and she is lost to follow up with her dermatologist who has since retired.  Patient denies new soaps, shampoo, hair product, foods or medications. Denies anaphylactic like symptoms. She is around children but they do not have the same symptoms nor has she seen lice or any other animal crawling in her head. Denies problems with dandruff.   On exam: no evidence of rash, escoriations, hair thinning, hyperpigmentation, trauma, louse, dryness, or plaques on scalp or forehead. There was evidenced of mild. white, dry scales with mild erythema on the back of the neck and along the hair line with mild escoriations present.  Suspect the dry patches on her neck and forehead are in the setting of an eczema flare. Will trial 1% hydrocortisone and recommend oatmeal gentle shampoo for scalp to attempt to ameliorate symptoms. Advised return to clinic if no improvement.

## 2022-07-04 MED ORDER — FASENRA PEN 30 MG/ML ~~LOC~~ SOAJ: 30.0000 mg | SUBCUTANEOUS | 6 refills | Status: DC

## 2022-07-04 NOTE — Telephone Encounter (Signed)
Thank you :)

## 2022-07-08 NOTE — Progress Notes (Signed)
Internal Medicine Clinic Attending  I saw and evaluated the patient.  I personally confirmed the key portions of the history and exam documented by Dr. Gomez-Caraballo and I reviewed pertinent patient test results.  The assessment, diagnosis, and plan were formulated together and I agree with the documentation in the resident's note.  

## 2022-07-09 ENCOUNTER — Ambulatory Visit (INDEPENDENT_AMBULATORY_CARE_PROVIDER_SITE_OTHER): Payer: Medicare HMO | Admitting: *Deleted

## 2022-07-09 DIAGNOSIS — J309 Allergic rhinitis, unspecified: Secondary | ICD-10-CM | POA: Diagnosis not present

## 2022-07-16 ENCOUNTER — Ambulatory Visit (INDEPENDENT_AMBULATORY_CARE_PROVIDER_SITE_OTHER): Payer: Medicare HMO

## 2022-07-16 DIAGNOSIS — J309 Allergic rhinitis, unspecified: Secondary | ICD-10-CM

## 2022-07-22 ENCOUNTER — Ambulatory Visit (INDEPENDENT_AMBULATORY_CARE_PROVIDER_SITE_OTHER): Payer: Medicare HMO

## 2022-07-22 ENCOUNTER — Other Ambulatory Visit: Payer: Self-pay

## 2022-07-22 ENCOUNTER — Inpatient Hospital Stay: Payer: Medicare HMO | Attending: Hematology and Oncology

## 2022-07-22 ENCOUNTER — Ambulatory Visit: Payer: Self-pay

## 2022-07-22 DIAGNOSIS — D472 Monoclonal gammopathy: Secondary | ICD-10-CM | POA: Diagnosis present

## 2022-07-22 DIAGNOSIS — I10 Essential (primary) hypertension: Secondary | ICD-10-CM | POA: Insufficient documentation

## 2022-07-22 DIAGNOSIS — Z7951 Long term (current) use of inhaled steroids: Secondary | ICD-10-CM | POA: Insufficient documentation

## 2022-07-22 DIAGNOSIS — Z7984 Long term (current) use of oral hypoglycemic drugs: Secondary | ICD-10-CM | POA: Diagnosis not present

## 2022-07-22 DIAGNOSIS — E042 Nontoxic multinodular goiter: Secondary | ICD-10-CM | POA: Diagnosis not present

## 2022-07-22 DIAGNOSIS — J4550 Severe persistent asthma, uncomplicated: Secondary | ICD-10-CM

## 2022-07-22 DIAGNOSIS — Z791 Long term (current) use of non-steroidal anti-inflammatories (NSAID): Secondary | ICD-10-CM | POA: Insufficient documentation

## 2022-07-22 DIAGNOSIS — Z79899 Other long term (current) drug therapy: Secondary | ICD-10-CM | POA: Diagnosis not present

## 2022-07-22 DIAGNOSIS — Z79890 Hormone replacement therapy (postmenopausal): Secondary | ICD-10-CM | POA: Diagnosis not present

## 2022-07-22 LAB — CBC WITH DIFFERENTIAL (CANCER CENTER ONLY)
Abs Immature Granulocytes: 0.01 10*3/uL (ref 0.00–0.07)
Basophils Absolute: 0 10*3/uL (ref 0.0–0.1)
Basophils Relative: 0 %
Eosinophils Absolute: 0 10*3/uL (ref 0.0–0.5)
Eosinophils Relative: 0 %
HCT: 37.8 % (ref 36.0–46.0)
Hemoglobin: 12.7 g/dL (ref 12.0–15.0)
Immature Granulocytes: 0 %
Lymphocytes Relative: 32 %
Lymphs Abs: 1.4 10*3/uL (ref 0.7–4.0)
MCH: 27.2 pg (ref 26.0–34.0)
MCHC: 33.6 g/dL (ref 30.0–36.0)
MCV: 80.9 fL (ref 80.0–100.0)
Monocytes Absolute: 0.5 10*3/uL (ref 0.1–1.0)
Monocytes Relative: 12 %
Neutro Abs: 2.4 10*3/uL (ref 1.7–7.7)
Neutrophils Relative %: 56 %
Platelet Count: 196 10*3/uL (ref 150–400)
RBC: 4.67 MIL/uL (ref 3.87–5.11)
RDW: 14.3 % (ref 11.5–15.5)
WBC Count: 4.2 10*3/uL (ref 4.0–10.5)
nRBC: 0 % (ref 0.0–0.2)

## 2022-07-22 LAB — CMP (CANCER CENTER ONLY)
ALT: 35 U/L (ref 0–44)
AST: 43 U/L — ABNORMAL HIGH (ref 15–41)
Albumin: 3.9 g/dL (ref 3.5–5.0)
Alkaline Phosphatase: 95 U/L (ref 38–126)
Anion gap: 5 (ref 5–15)
BUN: 5 mg/dL — ABNORMAL LOW (ref 8–23)
CO2: 28 mmol/L (ref 22–32)
Calcium: 9.5 mg/dL (ref 8.9–10.3)
Chloride: 105 mmol/L (ref 98–111)
Creatinine: 0.75 mg/dL (ref 0.44–1.00)
GFR, Estimated: >60 mL/min (ref >60)
Glucose, Bld: 132 mg/dL — ABNORMAL HIGH (ref 70–99)
Potassium: 3.7 mmol/L (ref 3.5–5.1)
Sodium: 138 mmol/L (ref 135–145)
Total Bilirubin: 0.3 mg/dL (ref 0.3–1.2)
Total Protein: 7 g/dL (ref 6.5–8.1)

## 2022-07-23 ENCOUNTER — Ambulatory Visit (HOSPITAL_COMMUNITY)
Admission: RE | Admit: 2022-07-23 | Discharge: 2022-07-23 | Disposition: A | Discharge status: Home / Self Care | Payer: Medicare HMO | Source: Ambulatory Visit | Attending: Internal Medicine | Admitting: Internal Medicine

## 2022-07-23 DIAGNOSIS — E041 Nontoxic single thyroid nodule: Secondary | ICD-10-CM | POA: Insufficient documentation

## 2022-07-23 LAB — KAPPA/LAMBDA LIGHT CHAINS
Kappa free light chain: 40.2 mg/L — ABNORMAL HIGH (ref 3.3–19.4)
Kappa, lambda light chain ratio: 4.84 — ABNORMAL HIGH (ref 0.26–1.65)
Lambda free light chains: 8.3 mg/L (ref 5.7–26.3)

## 2022-07-25 ENCOUNTER — Ambulatory Visit: Payer: Medicare HMO | Admitting: Licensed Clinical Social Worker

## 2022-07-25 DIAGNOSIS — F330 Major depressive disorder, recurrent, mild: Secondary | ICD-10-CM

## 2022-07-25 NOTE — BH Specialist Note (Signed)
Integrated Behavioral Health Initial In-Person Visit  MRN: [id number] Name: Andrea Lowery  Number of Integrated Behavioral Health Clinician visits: 1- Initial Visit  Session Start time: 1330    Session End time: 1400  Total time in minutes: 30   Types of Service: General Behavioral Integrated Care (BHI) and Introduction only  Interpretor:No. Interpretor Name and Language: n/A   Warm Hand Off Completed.        Subjective: Andrea Lowery is a 82 y.o. female  Patient was referred by Dickie La MD for Grief counseling. Patient reports the following symptoms/concerns: Grief with passing of grandson. Duration of problem: less than a year; Severity of problem: moderate  Objective: Mood: NA and Affect: Appropriate Risk of harm to self or others: No plan to harm self or others  Life Context: Family and Social: Sisters    Patient and/or Family's Strengths/Protective Factors: Social and Emotional competence  Goals Addressed: Patient will: Reduce symptoms of:  Grief  Progress towards Goals: Ongoing  Interventions: Interventions utilized: Link to [facility]  Standardized Assessments completed: PHQ-SADS    07/03/2022    1:49 PM 06/17/2022   10:22 AM 12/05/2021    4:02 PM  PHQ-SADS Last 3 Score only  PHQ Adolescent Score 0 8 9      Assessment:  BHC introduced self to patient.   Patient currently experiencing grief.  [facility] to [facility] for grief support counseling.   [facility] referred patient to silver sneakers at [facility].  Patient understood and excited about attending [facility].  Patient would like to continue therapy and request ongoing sessions to be via telehealth and to be called on cell phone.    Patient may benefit from ongoing therapy.  Plan: Follow up with behavioral health clinician on : July 16th via telephone  Christen Butter, MSW, LCSW-A She/Her Behavioral Health Clinician [facility]   Internal Medicine Center Direct [phone]  Fax [phone] Main Office Phone: [phone] [address] [place], [place] [address] Website: [facility] Internal Medicine [facility]  [place], [place]  [place]

## 2022-07-26 LAB — MULTIPLE MYELOMA PANEL, SERUM
Albumin SerPl Elph-Mcnc: 3.7 g/dL (ref 2.9–4.4)
Albumin/Glob SerPl: 1.2 (ref 0.7–1.7)
Alpha 1: 0.2 g/dL (ref 0.0–0.4)
Alpha2 Glob SerPl Elph-Mcnc: 0.6 g/dL (ref 0.4–1.0)
B-Globulin SerPl Elph-Mcnc: 1.1 g/dL (ref 0.7–1.3)
Gamma Glob SerPl Elph-Mcnc: 1.4 g/dL (ref 0.4–1.8)
Globulin, Total: 3.3 g/dL (ref 2.2–3.9)
IgA: 118 mg/dL (ref 64–422)
IgG (Immunoglobin G), Serum: 1570 mg/dL (ref 586–1602)
IgM (Immunoglobulin M), Srm: 36 mg/dL (ref 26–217)
M Protein SerPl Elph-Mcnc: 0.9 g/dL — ABNORMAL HIGH
Total Protein ELP: 7 g/dL (ref 6.0–8.5)

## 2022-07-29 NOTE — Progress Notes (Signed)
Stable thyroid nodules. No further follow-up recommended.

## 2022-07-31 ENCOUNTER — Ambulatory Visit (INDEPENDENT_AMBULATORY_CARE_PROVIDER_SITE_OTHER): Payer: Medicare HMO

## 2022-07-31 VITALS — Ht 66.0 in | Wt 194.0 lb

## 2022-07-31 DIAGNOSIS — Z0000 Encounter for general adult medical examination without abnormal findings: Secondary | ICD-10-CM | POA: Diagnosis not present

## 2022-07-31 NOTE — Progress Notes (Addendum)
Subjective:   Andrea Lowery is a 82 y.o. female who presents for Medicare Annual (Subsequent) preventive examination.  Visit Complete: Virtual  I connected with  Andrea Lowery on 07/31/22 by a audio enabled telemedicine application and verified that I am speaking with the correct person using two identifiers.  Patient Location: Home  Provider Location: Office/Clinic  I discussed the limitations of evaluation and management by telemedicine. The patient expressed understanding and agreed to proceed.    Review of Systems     Cardiac Risk Factors include: advanced age (>47men, >57 women);diabetes mellitus;dyslipidemia;hypertension;obesity (BMI >30kg/m2)     Objective:    Today's Vitals   07/31/22 1431  Weight: 194 lb (88 kg)  Height: 5\' 6"  (1.676 m)   Body mass index is 31.31 kg/m.     07/31/2022    2:46 PM 07/03/2022    1:45 PM 06/17/2022    9:03 AM 03/04/2022    8:50 PM 12/05/2021    4:03 PM 12/05/2021    2:39 PM 11/21/2021   10:24 AM  Advanced Directives  Does Patient Have a Medical Advance Directive? No No No No No No Yes  Type of Building surveyor of Healthcare Power of Attorney in Chart?       No - copy requested  Would patient like information on creating a medical advance directive?  No - Patient declined No - Patient declined  No - Patient declined No - Patient declined     Current Medications (verified) Outpatient Encounter Medications as of 07/31/2022  Medication Sig   acetaminophen (TYLENOL) 500 MG tablet Take 2 tablets (1,000 mg total) by mouth every 8 (eight) hours as needed for moderate pain.   albuterol (VENTOLIN HFA) 108 (90 Base) MCG/ACT inhaler INHALE 2 PUFFS EVERY 4 HOURS AS NEEDED FOR WHEEZING OR SHORTNESS OF BREATH   amLODipine-olmesartan (AZOR) 10-40 MG tablet TAKE 1 TABLET EVERY DAY   Benralizumab (FASENRA PEN) 30 MG/ML SOAJ Inject 1 mL (30 mg total) into the skin every 8 (eight) weeks.   Blood  Glucose Monitoring Suppl (TRUE METRIX METER) w/Device KIT Use as instructed   budesonide-formoterol (SYMBICORT) 160-4.5 MCG/ACT inhaler INHALE 2 PUFFS BY MOUTH INTO THE LUNGS TWICE DAILY RINSE GARGLE AND SPIT AFTER USE   carbamazepine (TEGRETOL XR) 200 MG 12 hr tablet Take 1 tablet (200 mg total) by mouth daily.   cetirizine (ZYRTEC) 10 MG tablet Take 1 tablet (10 mg total) by mouth daily.   diclofenac Sodium (VOLTAREN) 1 % GEL Apply to small joints of the hands and wrist up to 4 times daily   DULoxetine (CYMBALTA) 30 MG capsule TAKE 2 CAPSULES EVERY DAY   empagliflozin (JARDIANCE) 25 MG TABS tablet Take 1 tablet (25 mg total) by mouth daily before breakfast.   EPINEPHrine 0.3 mg/0.3 mL IJ SOAJ injection Use as directed for severe allergic reaction   esomeprazole (NEXIUM) 40 MG capsule Take 1 capsule (40 mg total) by mouth daily.   ezetimibe (ZETIA) 10 MG tablet Take 1 tablet (10 mg total) by mouth daily.   fluticasone (FLONASE) 50 MCG/ACT nasal spray Place 2 sprays into both nostrils daily.   glucose blood (TRUE METRIX BLOOD GLUCOSE TEST) test strip USE AS DIRECTED   hydrOXYzine (ATARAX) 25 MG tablet TAKE 1 TABLET BY MOUTH AT BEDTIME FOR ANXIETY   ipratropium-albuterol (DUONEB) 0.5-2.5 (3) MG/3ML SOLN Take 3 mLs by nebulization every 6 (six) hours as needed.  levothyroxine (SYNTHROID) 50 MCG tablet Take 1 tablet every day, at least 30-60 minutes before other food or medicine.   montelukast (SINGULAIR) 10 MG tablet Take 1 tablet (10 mg total) by mouth at bedtime.   Olopatadine-Mometasone (RYALTRIS) X543819 MCG/ACT SUSP Place 2 sprays into the nose 2 (two) times daily.   rosuvastatin (CRESTOR) 40 MG tablet TAKE 1 TABLET EVERY DAY (NEED MD APPOINTMENT FOR REFILLS)   TRUEplus Lancets 33G MISC USE AS DIRECTED   Vitamin D, Ergocalciferol, (DRISDOL) 1.25 MG (50000 UNIT) CAPS capsule Take 1 capsule (50,000 Units total) by mouth every 7 (seven) days.   metoprolol succinate (TOPROL XL) 50 MG 24 hr tablet  Take 1 tablet (50 mg total) by mouth daily. Take with or immediately following a meal.   spironolactone (ALDACTONE) 25 MG tablet TAKE 2 TABLETS EVERY DAY (Patient not taking: Reported on 07/31/2022)   Facility-Administered Encounter Medications as of 07/31/2022  Medication   Benralizumab SOSY 30 mg    Allergies (verified) Bee venom and Penicillins   History: Past Medical History:  Diagnosis Date   Arthritis    Asthma    Asthma 01/10/2016   Last Assessment & Plan:  After recent ED eval- currrently utilizing prednisone as prescribed. Begin Levaquin, prednisone and Albuterol MDI to be utilized as needed for acute wheezing FU in 2 day is no improvement or worsening of symptoms. Pt verbalizes understanding for current treatment plan. Follow-up in one week. Plan to increase chronic inhaled steroid dose to assess further with prevention of   Diabetes mellitus without complication (HCC)    Diabetes II   Difficulty sleeping 12/28/2013   Last Assessment & Plan:  Stable with trazodone nightly. Continue current treatment plan. Refill trazodone today. Tolerating medication well without side effects. Return in 3 months for follow up or sooner if any worsening of symptoms.    DOE (dyspnea on exertion) 07/01/2012   DVT (deep venous thrombosis) (HCC)    "Years ago"   Dyslipidemia    Eczema    GERD (gastroesophageal reflux disease)    Headache    Migraine   Hypertension    Hypokalemia 04/30/2016   Hypothyroidism    LBBB (left bundle branch block)    MGUS (monoclonal gammopathy of unknown significance)    MGUS (monoclonal gammopathy of unknown significance)    Migraine    OSA on CPAP    Sinus infection    had sinus surgery   Urinary frequency 07/25/2021   Past Surgical History:  Procedure Laterality Date   ABDOMINAL HYSTERECTOMY  1969   ARTERY BIOPSY N/A 11/07/2014   Procedure: BIOPSY TEMPORAL ARTERY;  Surgeon: Newman Pies, MD;  Location: [facility] OR;  Service: ENT;  Laterality: N/A;   CARPAL TUNNEL  RELEASE Right    CHOLECYSTECTOMY  1976   ENDOVENOUS ABLATION SAPHENOUS VEIN W/ LASER Left 08/24/2020   endovenous laser ablation left greater saphenous vein by Cari Caraway MD   LUMBAR LAMINECTOMY/DECOMPRESSION MICRODISCECTOMY N/A 07/31/2015   Procedure: L4-5 Decompression, Possible Right L4-5 Microdiscectomy;  Surgeon: Eldred Manges, MD;  Location: [facility] OR;  Service: Orthopedics;  Laterality: N/A;   NASAL SINUS SURGERY     "years ago"   NM MYOVIEW LTD  02/13/2010   No ischemia   PLANTAR FASCIA RELEASE     TONSILLECTOMY  1965   US ECHOCARDIOGRAPHY  09/20/2008   borderline LVH,mild TR,AOV mildly sclerotic w/ca+ of the leaflets   Family History  Problem Relation Age of Onset   Dementia Mother    Gallbladder disease  Mother    Diabetes Father    Heart failure Father    Hypertension Father    Sleep apnea Father    Kidney disease Father    Dementia Father    Thyroid disease Sister    Hypertension Sister    Dementia Sister    Healthy Sister    Social History   Socioeconomic History   Marital status: Divorced    Spouse name: Not on file   Number of children: Not on file   Years of education: Not on file   Highest education level: Not on file  Occupational History   Not on file  Tobacco Use   Smoking status: Former    Types: Cigarettes    Quit date: 02/04/2006    Years since quitting: 16.4   Smokeless tobacco: Never  Vaping Use   Vaping Use: Never used  Substance and Sexual Activity   Alcohol use: No    Alcohol/week: 0.0 standard drinks of alcohol   Drug use: No   Sexual activity: Not on file  Other Topics Concern   Not on file  Social History Narrative   Lives with sister in a one story home.  Has one child.  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     Right handed   Caffeine: 24 oz/day of pepsi   Social Determinants of Health   Financial Resource Strain: Low Risk  (07/31/2022)   Overall Financial Resource Strain (CARDIA)    Difficulty of Paying Living Expenses: Not hard at all  Food  Insecurity: No Food Insecurity (07/31/2022)   Hunger Vital Sign    Worried About Running Out of Food in the Last Year: Never true    Ran Out of Food in the Last Year: Never true  Transportation Needs: No Transportation Needs (07/31/2022)   PRAPARE - Administrator, Civil Service (Medical): No    Lack of Transportation (Non-Medical): No  Physical Activity: Inactive (07/31/2022)   Exercise Vital Sign    Days of Exercise per Week: 0 days    Minutes of Exercise per Session: 0 min  Stress: Stress Concern Present (07/31/2022)   [employer] of Occupational Health - Occupational Stress Questionnaire    Feeling of Stress : To some extent  Social Connections: Moderately Integrated (07/31/2022)   Social Connection and Isolation Panel [NHANES]    Frequency of Communication with Friends and Family: More than three times a week    Frequency of Social Gatherings with Friends and Family: More than three times a week    Attends Religious Services: More than 4 times per year    Active Member of [employer] or Organizations: Yes    Attends Engineer, structural: More than 4 times per year    Marital Status: Divorced    Tobacco Counseling Counseling given: Not Answered   Clinical Intake:  Pre-visit preparation completed: Yes  Pain : 0-10 Pain Type: Chronic pain Pain Location: Hip Pain Orientation: Right Pain Radiating Towards: down leg Pain Descriptors / Indicators: Shooting Pain Onset: More than a month ago Pain Frequency: Intermittent     Nutritional Status: BMI > 30  Obese Nutritional Risks: None Diabetes: Yes CBG done?: No Did pt. bring in CBG monitor from home?: No  How often do you need to have someone help you when you read instructions, pamphlets, or other written materials from your doctor or pharmacy?: 1 - Never  Interpreter Needed?: No  Information entered by :: NAllen LPN   Activities of Daily Living  07/31/2022    2:36 PM 07/03/2022    1:45 PM  In  your present state of health, do you have any difficulty performing the following activities:  Hearing? 0 0  Vision? 0 0  Difficulty concentrating or making decisions? 0 0  Walking or climbing stairs? 1 0  Dressing or bathing? 0 0  Doing errands, shopping? 0 0  Preparing Food and eating ? N   Using the Toilet? N   In the past six months, have you accidently leaked urine? N   Do you have problems with loss of bowel control? N   Managing your Medications? N   Managing your Finances? N   Housekeeping or managing your Housekeeping? N     Patient Care Team: Dickie La, MD as PCP - General (Internal Medicine) Janalyn Harder, MD (Inactive) as Consulting Physician (Dermatology)  Indicate any recent Medical Services you may have received from other than [facility] providers in the past year (date may be approximate).     Assessment:   This is a routine wellness examination for Kasara.  Hearing/Vision screen Hearing Screening - Comments:: Denies hearing difficulties Vision Screening - Comments:: Regular eye exams, [facility]  Dietary issues and exercise activities discussed:     Goals Addressed             This Visit's Progress    Patient Stated       07/31/2022, start taking better care of self and eat healthier       Depression Screen    07/31/2022    2:48 PM 07/03/2022    1:49 PM 06/17/2022   10:22 AM 12/05/2021    4:02 PM 12/05/2021    2:38 PM 11/05/2021    8:58 AM 10/22/2021   10:07 AM  PHQ 2/9 Scores  PHQ - 2 Score 3 0 2 2 2 2  0  PHQ- 9 Score 6 0 8 9  9      Fall Risk    07/31/2022    2:47 PM 07/03/2022    1:45 PM 06/17/2022    9:09 AM 12/05/2021    4:03 PM 12/05/2021    2:38 PM  Fall Risk   Falls in the past year? 0 0 0 1 1  Number falls in past yr: 0 0 0 1 1  Injury with Fall? 0 0 0 1 0  Risk for fall due to : Medication side effect;Impaired mobility  No Fall Risks No Fall Risks Other (Comment)  Follow up Falls prevention discussed;Falls evaluation completed  Falls evaluation completed Falls evaluation completed Falls evaluation completed;Falls prevention discussed Falls prevention discussed    MEDICARE RISK AT HOME:  Medicare Risk at Home - 07/31/22 1448     Any stairs in or around the home? No    If so, are there any without handrails? No    Home free of loose throw rugs in walkways, pet beds, electrical cords, etc? Yes    Adequate lighting in your home to reduce risk of falls? Yes    Life alert? No    Use of a cane, walker or w/c? No    Grab bars in the bathroom? No    Shower chair or bench in shower? Yes    Elevated toilet seat or a handicapped toilet? Yes             TIMED UP AND GO:  Was the test performed?  No    Cognitive Function:        07/31/2022  2:51 PM 12/05/2021    4:05 PM  6CIT Screen  What Year? 0 points 0 points  What month? 0 points 0 points  What time? 0 points 0 points  Count back from 20 2 points 0 points  Months in reverse 0 points 0 points  Repeat phrase 0 points 0 points  Total Score 2 points 0 points    Immunizations Immunization History  Administered Date(s) Administered   Influenza Split 11/06/2013   Influenza, High Dose Seasonal PF 11/05/2013, 11/05/2014, 11/17/2015, 10/17/2016, 12/01/2017, 10/03/2018   Influenza, Seasonal, Injecte, Preservative Fre 11/08/2015   Influenza-Unspecified 11/06/2013, 11/08/2015, 11/04/2020   PFIZER(Purple Top)SARS-COV-2 Vaccination 02/23/2019, 03/16/2019   Pfizer Covid-19 Vaccine Bivalent Booster 82yrs & up 12/03/2019   Pneumococcal Conjugate-13 01/25/2014   Pneumococcal Polysaccharide-23 11/03/2014   Zoster Recombinat (Shingrix) 10/17/2016, 01/29/2017    TDAP status: Due, Education has been provided regarding the importance of this vaccine. Advised may receive this vaccine at local pharmacy or Health Dept. Aware to provide a copy of the vaccination record if obtained from local pharmacy or Health Dept. Verbalized acceptance and understanding.  Flu Vaccine  status: Up to date  Pneumococcal vaccine status: Up to date  Covid-19 vaccine status: Information provided on how to obtain vaccines.   Qualifies for Shingles Vaccine? Yes   Zostavax completed Yes   Shingrix Completed?: Yes  Screening Tests Health Maintenance  Topic Date Due   DTaP/Tdap/Td (1 - Tdap) Never done   DEXA SCAN  Never done   OPHTHALMOLOGY EXAM  07/12/2021   COVID-19 Vaccine (4 - 2023-24 season) 10/05/2021   INFLUENZA VACCINE  09/05/2022   FOOT EXAM  10/23/2022   MAMMOGRAM  10/23/2022   HEMOGLOBIN A1C  12/18/2022   Diabetic kidney evaluation - Urine ACR  03/02/2023   Diabetic kidney evaluation - eGFR measurement  07/22/2023   Medicare Annual Wellness (AWV)  07/31/2023   Pneumonia Vaccine 45+ Years old  Completed   Zoster Vaccines- Shingrix  Completed   HPV VACCINES  Aged Out   Hepatitis C Screening  Discontinued    Health Maintenance  Health Maintenance Due  Topic Date Due   DTaP/Tdap/Td (1 - Tdap) Never done   DEXA SCAN  Never done   OPHTHALMOLOGY EXAM  07/12/2021   COVID-19 Vaccine (4 - 2023-24 season) 10/05/2021    Colorectal cancer screening: No longer required.   Mammogram status: No longer required due to age.  Bone Density status: Ordered 11/05/2021. Pt provided with contact info and advised to call to schedule appt.  Lung Cancer Screening: (Low Dose CT Chest recommended if Age 19-80 years, 20 pack-year currently smoking OR have quit w/in 15years.) does not qualify.   Lung Cancer Screening Referral: no  Additional Screening:  Hepatitis C Screening: does not qualify;   Vision Screening: Recommended annual ophthalmology exams for early detection of glaucoma and other disorders of the eye. Is the patient up to date with their annual eye exam?  Yes  Who is the provider or what is the name of the office in which the patient attends annual eye exams? [facility] If pt is not established with a provider, would they like to be referred to a  provider to establish care? No .   Dental Screening: Recommended annual dental exams for proper oral hygiene  Diabetic Foot Exam: Diabetic Foot Exam: Completed 10/22/2021  Community Resource Referral / Chronic Care Management: CRR required this visit?  No   CCM required this visit?  No     Plan:  I have personally reviewed and noted the following in the patient's chart:   Medical and social history Use of alcohol, tobacco or illicit drugs  Current medications and supplements including opioid prescriptions. Patient is not currently taking opioid prescriptions. Functional ability and status Nutritional status Physical activity Advanced directives List of other physicians Hospitalizations, surgeries, and ER visits in previous 12 months Vitals Screenings to include cognitive, depression, and falls Referrals and appointments  In addition, I have reviewed and discussed with patient certain preventive protocols, quality metrics, and best practice recommendations. A written personalized care plan for preventive services as well as general preventive health recommendations were provided to patient.     Barb Merino, LPN   1/61/0960   After Visit Summary: (MyChart) Due to this being a telephonic visit, the after visit summary with patients personalized plan was offered to patient via MyChart   Nurse Notes: none

## 2022-07-31 NOTE — Patient Instructions (Signed)
Andrea Lowery , Thank you for taking time to come for your Medicare Wellness Visit. I appreciate your ongoing commitment to your health goals. Please review the following plan we discussed and let me know if I can assist you in the future.   These are the goals we discussed:  Goals      Patient Stated     07/31/2022, start taking better care of self and eat healthier        This is a list of the screening recommended for you and due dates:  Health Maintenance  Topic Date Due   DTaP/Tdap/Td vaccine (1 - Tdap) Never done   DEXA scan (bone density measurement)  Never done   Eye exam for diabetics  07/12/2021   COVID-19 Vaccine (4 - 2023-24 season) 10/05/2021   Flu Shot  09/05/2022   Complete foot exam   10/23/2022   Mammogram  10/23/2022   Medicare Annual Wellness Visit  12/06/2022   Hemoglobin A1C  12/18/2022   Yearly kidney health urinalysis for diabetes  03/02/2023   Yearly kidney function blood test for diabetes  07/22/2023   Pneumonia Vaccine  Completed   Zoster (Shingles) Vaccine  Completed   HPV Vaccine  Aged Out   Hepatitis C Screening  Discontinued    Advanced directives: Advance directive discussed with you today.   Conditions/risks identified: none  Next appointment: Follow up in one year for your annual wellness visit    Preventive Care 65 Years and Older, Female Preventive care refers to lifestyle choices and visits with your health care provider that can promote health and wellness. What does preventive care include? A yearly physical exam. This is also called an annual well check. Dental exams once or twice a year. Routine eye exams. Ask your health care provider how often you should have your eyes checked. Personal lifestyle choices, including: Daily care of your teeth and gums. Regular physical activity. Eating a healthy diet. Avoiding tobacco and drug use. Limiting alcohol use. Practicing safe sex. Taking low-dose aspirin every day. Taking vitamin and  mineral supplements as recommended by your health care provider. What happens during an annual well check? The services and screenings done by your health care provider during your annual well check will depend on your age, overall health, lifestyle risk factors, and family history of disease. Counseling  Your health care provider may ask you questions about your: Alcohol use. Tobacco use. Drug use. Emotional well-being. Home and relationship well-being. Sexual activity. Eating habits. History of falls. Memory and ability to understand (cognition). Work and work Astronomer. Reproductive health. Screening  You may have the following tests or measurements: Height, weight, and BMI. Blood pressure. Lipid and cholesterol levels. These may be checked every 5 years, or more frequently if you are over 60 years old. Skin check. Lung cancer screening. You may have this screening every year starting at age 71 if you have a 30-pack-year history of smoking and currently smoke or have quit within the past 15 years. Fecal occult blood test (FOBT) of the stool. You may have this test every year starting at age 71. Flexible sigmoidoscopy or colonoscopy. You may have a sigmoidoscopy every 5 years or a colonoscopy every 10 years starting at age 66. Hepatitis C blood test. Hepatitis B blood test. Sexually transmitted disease (STD) testing. Diabetes screening. This is done by checking your blood sugar (glucose) after you have not eaten for a while (fasting). You may have this done every 1-3 years. Bone density scan.  This is done to screen for osteoporosis. You may have this done starting at age 34. Mammogram. This may be done every 1-2 years. Talk to your health care provider about how often you should have regular mammograms. Talk with your health care provider about your test results, treatment options, and if necessary, the need for more tests. Vaccines  Your health care provider may recommend  certain vaccines, such as: Influenza vaccine. This is recommended every year. Tetanus, diphtheria, and acellular pertussis (Tdap, Td) vaccine. You may need a Td booster every 10 years. Zoster vaccine. You may need this after age 65. Pneumococcal 13-valent conjugate (PCV13) vaccine. One dose is recommended after age 46. Pneumococcal polysaccharide (PPSV23) vaccine. One dose is recommended after age 63. Talk to your health care provider about which screenings and vaccines you need and how often you need them. This information is not intended to replace advice given to you by your health care provider. Make sure you discuss any questions you have with your health care provider. Document Released: 02/17/2015 Document Revised: 10/11/2015 Document Reviewed: 11/22/2014 Elsevier Interactive Patient Education  2017 [place] Prevention in the Home Falls can cause injuries. They can happen to people of all ages. There are many things you can do to make your home safe and to help prevent falls. What can I do on the outside of my home? Regularly fix the edges of walkways and driveways and fix any cracks. Remove anything that might make you trip as you walk through a door, such as a raised step or threshold. Trim any bushes or trees on the path to your home. Use bright outdoor lighting. Clear any walking paths of anything that might make someone trip, such as rocks or tools. Regularly check to see if handrails are loose or broken. Make sure that both sides of any steps have handrails. Any raised decks and porches should have guardrails on the edges. Have any leaves, snow, or ice cleared regularly. Use sand or salt on walking paths during winter. Clean up any spills in your garage right away. This includes oil or grease spills. What can I do in the bathroom? Use night lights. Install grab bars by the toilet and in the tub and shower. Do not use towel bars as grab bars. Use non-skid mats or  decals in the tub or shower. If you need to sit down in the shower, use a plastic, non-slip stool. Keep the floor dry. Clean up any water that spills on the floor as soon as it happens. Remove soap buildup in the tub or shower regularly. Attach bath mats securely with double-sided non-slip rug tape. Do not have throw rugs and other things on the floor that can make you trip. What can I do in the bedroom? Use night lights. Make sure that you have a light by your bed that is easy to reach. Do not use any sheets or blankets that are too big for your bed. They should not hang down onto the floor. Have a firm chair that has side arms. You can use this for support while you get dressed. Do not have throw rugs and other things on the floor that can make you trip. What can I do in the kitchen? Clean up any spills right away. Avoid walking on wet floors. Keep items that you use a lot in easy-to-reach places. If you need to reach something above you, use a strong step stool that has a grab bar. Keep electrical cords  out of the way. Do not use floor polish or wax that makes floors slippery. If you must use wax, use non-skid floor wax. Do not have throw rugs and other things on the floor that can make you trip. What can I do with my stairs? Do not leave any items on the stairs. Make sure that there are handrails on both sides of the stairs and use them. Fix handrails that are broken or loose. Make sure that handrails are as long as the stairways. Check any carpeting to make sure that it is firmly attached to the stairs. Fix any carpet that is loose or worn. Avoid having throw rugs at the top or bottom of the stairs. If you do have throw rugs, attach them to the floor with carpet tape. Make sure that you have a light switch at the top of the stairs and the bottom of the stairs. If you do not have them, ask someone to add them for you. What else can I do to help prevent falls? Wear shoes that: Do not  have high heels. Have rubber bottoms. Are comfortable and fit you well. Are closed at the toe. Do not wear sandals. If you use a stepladder: Make sure that it is fully opened. Do not climb a closed stepladder. Make sure that both sides of the stepladder are locked into place. Ask someone to hold it for you, if possible. Clearly mark and make sure that you can see: Any grab bars or handrails. First and last steps. Where the edge of each step is. Use tools that help you move around (mobility aids) if they are needed. These include: Canes. Walkers. Scooters. Crutches. Turn on the lights when you go into a dark area. Replace any light bulbs as soon as they burn out. Set up your furniture so you have a clear path. Avoid moving your furniture around. If any of your floors are uneven, fix them. If there are any pets around you, be aware of where they are. Review your medicines with your doctor. Some medicines can make you feel dizzy. This can increase your chance of falling. Ask your doctor what other things that you can do to help prevent falls. This information is not intended to replace advice given to you by your health care provider. Make sure you discuss any questions you have with your health care provider. Document Released: 11/17/2008 Document Revised: 06/29/2015 Document Reviewed: 02/25/2014 Elsevier Interactive Patient Education  2017 [employer].

## 2022-07-31 NOTE — Addendum Note (Signed)
Addended by: Lucille Passy on: 07/31/2022 05:39 PM   Modules accepted: Level of Service

## 2022-08-01 ENCOUNTER — Ambulatory Visit (INDEPENDENT_AMBULATORY_CARE_PROVIDER_SITE_OTHER): Payer: Medicare HMO

## 2022-08-01 ENCOUNTER — Encounter: Payer: Self-pay | Admitting: Hematology and Oncology

## 2022-08-01 ENCOUNTER — Inpatient Hospital Stay (HOSPITAL_BASED_OUTPATIENT_CLINIC_OR_DEPARTMENT_OTHER): Payer: Medicare HMO | Admitting: Hematology and Oncology

## 2022-08-01 ENCOUNTER — Other Ambulatory Visit: Payer: Self-pay

## 2022-08-01 VITALS — BP 186/91 | HR 62 | Temp 97.6°F | Resp 18 | Ht 66.0 in | Wt 190.0 lb

## 2022-08-01 DIAGNOSIS — D472 Monoclonal gammopathy: Secondary | ICD-10-CM

## 2022-08-01 DIAGNOSIS — J309 Allergic rhinitis, unspecified: Secondary | ICD-10-CM | POA: Diagnosis not present

## 2022-08-01 DIAGNOSIS — I10 Essential (primary) hypertension: Secondary | ICD-10-CM

## 2022-08-01 NOTE — Progress Notes (Signed)
[facility] OFFICE PROGRESS NOTE  Patient Care Team: Dickie La, MD as PCP - General (Internal Medicine) Janalyn Harder, MD (Inactive) as Consulting Physician (Dermatology)  ASSESSMENT & PLAN:  MGUS (monoclonal gammopathy of unknown significance) Clinically, she has no signs of disease progression. Her myeloma panel is stable/better compared to previous years I will continue her yearly visit We discussed briefly the natural history of myeloma  Essential hypertension Her blood pressure is high The patient stated she is taking all her medications as prescribed I would defer to her primary care doctor for further management  Orders Placed This Encounter  Procedures   CMP (Cancer Center only)    Standing Status:   Future    Standing Expiration Date:   08/01/2023   CBC with Differential (Cancer Center Only)    Standing Status:   Future    Standing Expiration Date:   08/01/2023   Kappa/lambda light chains    Standing Status:   Future    Standing Expiration Date:   08/01/2023   Multiple Myeloma Panel (SPEP&IFE w/QIG)    Standing Status:   Future    Standing Expiration Date:   08/01/2023    All questions were answered. The patient knows to call the clinic with any problems, questions or concerns. The total time spent in the appointment was 20 minutes encounter with patients including review of chart and various tests results, discussions about plan of care and coordination of care plan   Artis Delay, MD 08/01/2022 1:11 PM  INTERVAL HISTORY: Please see below for problem oriented charting. she returns for surveillance follow-up for MGUS She is doing well No new bone pain Denies recent atypical infection We reviewed test results  REVIEW OF SYSTEMS:   Constitutional: Denies fevers, chills or abnormal weight loss Eyes: Denies blurriness of vision Ears, nose, mouth, throat, and face: Denies mucositis or sore throat Respiratory: Denies cough, dyspnea or  wheezes Cardiovascular: Denies palpitation, chest discomfort or lower extremity swelling Gastrointestinal:  Denies nausea, heartburn or change in bowel habits Skin: Denies abnormal skin rashes Lymphatics: Denies new lymphadenopathy or easy bruising Neurological:Denies numbness, tingling or new weaknesses Behavioral/Psych: Mood is stable, no new changes  All other systems were reviewed with the patient and are negative.  I have reviewed the past medical history, past surgical history, social history and family history with the patient and they are unchanged from previous note.  ALLERGIES:  is allergic to bee venom and penicillins.  MEDICATIONS:  Current Outpatient Medications  Medication Sig Dispense Refill   acetaminophen (TYLENOL) 500 MG tablet Take 2 tablets (1,000 mg total) by mouth every 8 (eight) hours as needed for moderate pain. 100 tablet 2   albuterol (VENTOLIN HFA) 108 (90 Base) MCG/ACT inhaler INHALE 2 PUFFS EVERY 4 HOURS AS NEEDED FOR WHEEZING OR SHORTNESS OF BREATH 3 each 1   amLODipine-olmesartan (AZOR) 10-40 MG tablet TAKE 1 TABLET EVERY DAY 90 tablet 3   Benralizumab (FASENRA PEN) 30 MG/ML SOAJ Inject 1 mL (30 mg total) into the skin every 8 (eight) weeks. 1 mL 6   Blood Glucose Monitoring Suppl (TRUE METRIX METER) w/Device KIT Use as instructed 1 kit 1   budesonide-formoterol (SYMBICORT) 160-4.5 MCG/ACT inhaler INHALE 2 PUFFS BY MOUTH INTO THE LUNGS TWICE DAILY RINSE GARGLE AND SPIT AFTER USE 30.6 g 1   carbamazepine (TEGRETOL XR) 200 MG 12 hr tablet Take 1 tablet (200 mg total) by mouth daily. 90 tablet 0   cetirizine (ZYRTEC) 10 MG tablet Take 1  tablet (10 mg total) by mouth daily. 30 tablet 5   diclofenac Sodium (VOLTAREN) 1 % GEL Apply to small joints of the hands and wrist up to 4 times daily 100 g 2   DULoxetine (CYMBALTA) 30 MG capsule TAKE 2 CAPSULES EVERY DAY 180 capsule 3   empagliflozin (JARDIANCE) 25 MG TABS tablet Take 1 tablet (25 mg total) by mouth daily  before breakfast. 60 tablet 2   EPINEPHrine 0.3 mg/0.3 mL IJ SOAJ injection Use as directed for severe allergic reaction 3 each 1   esomeprazole (NEXIUM) 40 MG capsule Take 1 capsule (40 mg total) by mouth daily. 90 capsule 3   ezetimibe (ZETIA) 10 MG tablet Take 1 tablet (10 mg total) by mouth daily. 30 tablet 11   fluticasone (FLONASE) 50 MCG/ACT nasal spray Place 2 sprays into both nostrils daily. 48 g 1   glucose blood (TRUE METRIX BLOOD GLUCOSE TEST) test strip USE AS DIRECTED 300 strip 3   hydrOXYzine (ATARAX) 25 MG tablet TAKE 1 TABLET BY MOUTH AT BEDTIME FOR ANXIETY 30 tablet 0   ipratropium-albuterol (DUONEB) 0.5-2.5 (3) MG/3ML SOLN Take 3 mLs by nebulization every 6 (six) hours as needed. 360 mL 2   levothyroxine (SYNTHROID) 50 MCG tablet Take 1 tablet every day, at least 30-60 minutes before other food or medicine. 90 tablet 0   metoprolol succinate (TOPROL XL) 50 MG 24 hr tablet Take 1 tablet (50 mg total) by mouth daily. Take with or immediately following a meal. 30 tablet 11   montelukast (SINGULAIR) 10 MG tablet Take 1 tablet (10 mg total) by mouth at bedtime. 90 tablet 1   Olopatadine-Mometasone (RYALTRIS) 665-25 MCG/ACT SUSP Place 2 sprays into the nose 2 (two) times daily. 29 g 2   rosuvastatin (CRESTOR) 40 MG tablet TAKE 1 TABLET EVERY DAY (NEED MD APPOINTMENT FOR REFILLS) 90 tablet 3   spironolactone (ALDACTONE) 25 MG tablet TAKE 2 TABLETS EVERY DAY (Patient not taking: Reported on 07/31/2022) 60 tablet 11   TRUEplus Lancets 33G MISC USE AS DIRECTED 300 each 3   Vitamin D, Ergocalciferol, (DRISDOL) 1.25 MG (50000 UNIT) CAPS capsule Take 1 capsule (50,000 Units total) by mouth every 7 (seven) days. 8 capsule 0   Current Facility-Administered Medications  Medication Dose Route Frequency Provider Last Rate Last Admin   Benralizumab SOSY 30 mg  30 mg Subcutaneous Q8 Weeks Kozlow, Alvira Philips, MD   30 mg at 07/22/22 1045    SUMMARY OF ONCOLOGIC HISTORY:  I reviewed the patient's  records extensive and collaborated the history with the patient. Summary of her history is as follows: She has a long-standing history of multiple arthralgias, left CMC pain, OA of the bilateral knees, hands and feet and osteoporosis for which she is on Prolia previously. She had SPEP drawn on 02/19/2013 that revealed a restricted band with monoclonal protein present, IgG kappa subtype. The monoclonal protein peak accounted for 1.38 g/dL of the total 1.61 g/dL of protein in the gamma region. Her CMP demonstrated a creatinine of 0.81 and calcium of 9.0 and total protein of 7.2 and albumin of 3.9; Her CBC was within normal limits with a hemoglobin of 12.5. Skeletal x-ray show no evidence of lytic lesions. She had a bone marrow biopsy on 06/22/2013 which show 10% of plasma cells. She was observed.  PHYSICAL EXAMINATION: ECOG PERFORMANCE STATUS: 0 - Asymptomatic  Vitals:   08/01/22 1116  BP: (!) 186/91  Pulse: 62  Resp: 18  Temp: 97.6 F (36.4 C)  SpO2: 98%   Filed Weights   08/01/22 1116  Weight: 190 lb (86.2 kg)    GENERAL:alert, no distress and comfortable NEURO: alert & oriented x 3 with fluent speech, no focal motor/sensory deficits  LABORATORY DATA:  I have reviewed the data as listed    Component Value Date/Time   NA 138 07/22/2022 1052   NA 139 06/17/2022 1003   NA 140 04/23/2016 1303   K 3.7 07/22/2022 1052   K 2.8 (LL) 04/23/2016 1303   CL 105 07/22/2022 1052   CO2 28 07/22/2022 1052   CO2 28 04/23/2016 1303   GLUCOSE 132 (H) 07/22/2022 1052   GLUCOSE 156 (H) 04/23/2016 1303   BUN 5 (L) 07/22/2022 1052   BUN 11 06/17/2022 1003   BUN 6.9 (L) 04/23/2016 1303   CREATININE 0.75 07/22/2022 1052   CREATININE 0.9 04/23/2016 1303   CALCIUM 9.5 07/22/2022 1052   CALCIUM 9.7 04/23/2016 1303   PROT 7.0 07/22/2022 1052   PROT 7.3 06/17/2022 1003   PROT 8.0 04/23/2016 1303   ALBUMIN 3.9 07/22/2022 1052   ALBUMIN 4.5 06/17/2022 1003   ALBUMIN 3.9 04/23/2016 1303   AST 43 (H)  07/22/2022 1052   AST 19 04/23/2016 1303   ALT 35 07/22/2022 1052   ALT 24 04/23/2016 1303   ALKPHOS 95 07/22/2022 1052   ALKPHOS 65 04/23/2016 1303   BILITOT 0.3 07/22/2022 1052   BILITOT 0.53 04/23/2016 1303   GFRNONAA >60 07/22/2022 1052   GFRAA >60 02/02/2019 0339   GFRAA >60 05/29/2018 1141    No results found for: "SPEP", "UPEP"  Lab Results  Component Value Date   WBC 4.2 07/22/2022   NEUTROABS 2.4 07/22/2022   HGB 12.7 07/22/2022   HCT 37.8 07/22/2022   MCV 80.9 07/22/2022   PLT 196 07/22/2022      Chemistry      Component Value Date/Time   NA 138 07/22/2022 1052   NA 139 06/17/2022 1003   NA 140 04/23/2016 1303   K 3.7 07/22/2022 1052   K 2.8 (LL) 04/23/2016 1303   CL 105 07/22/2022 1052   CO2 28 07/22/2022 1052   CO2 28 04/23/2016 1303   BUN 5 (L) 07/22/2022 1052   BUN 11 06/17/2022 1003   BUN 6.9 (L) 04/23/2016 1303   CREATININE 0.75 07/22/2022 1052   CREATININE 0.9 04/23/2016 1303      Component Value Date/Time   CALCIUM 9.5 07/22/2022 1052   CALCIUM 9.7 04/23/2016 1303   ALKPHOS 95 07/22/2022 1052   ALKPHOS 65 04/23/2016 1303   AST 43 (H) 07/22/2022 1052   AST 19 04/23/2016 1303   ALT 35 07/22/2022 1052   ALT 24 04/23/2016 1303   BILITOT 0.3 07/22/2022 1052   BILITOT 0.53 04/23/2016 1303       RADIOGRAPHIC STUDIES: I have personally reviewed the radiological images as listed and agreed with the findings in the report. US THYROID  Result Date: 07/25/2022 CLINICAL DATA:  Prior ultrasound follow-up. History of thyroid nodules. History fine-needle aspiration of a dominant left thyroid nodule on 11/01/2014 at [facility] with benign cytology. EXAM: THYROID ULTRASOUND TECHNIQUE: Ultrasound examination of the thyroid gland and adjacent soft tissues was performed. COMPARISON:  08/10/2014. Additional report from a thyroid ultrasound at [facility] on 12/17/2016 FINDINGS: Parenchymal Echotexture: Mildly heterogenous Isthmus: 0.6 cm Right lobe: 3.5 x 1.4 x 1.7 cm  Left lobe: 4.5 x 1.8 x 1.8 cm _________________________________________________________ Estimated total number of nodules >/= 1 cm: 2 Number of spongiform nodules >/=  2 cm not described below (TR1): 0 Number of mixed cystic and solid nodules >/= 1.5 cm not described below (TR2): 0 _________________________________________________________ Nodule # 1: Prior biopsy: No Location: Right; Superior Maximum size: 0.9 cm; Other 2 dimensions: 0.9 x 0.9 cm, previously, 1.0 cm Composition: solid/almost completely solid (2) Echogenicity: hyperechoic (1) Shape: not taller-than-wide (0) Margins: smooth (0) Echogenic foci: none (0) ACR TI-RADS total points: 3. ACR TI-RADS risk category:  TR3 (3 points). Significant change in size (>/= 20% in two dimensions and minimal increase of 2 mm): No Change in features: Yes Change in ACR TI-RADS risk category: Yes ACR TI-RADS recommendations: Given size (<1.4 cm) and appearance, this nodule does NOT meet TI-RADS criteria for biopsy or dedicated follow-up. This nodule is slightly smaller and has gone from hypoechoic to hyperechoic in appearance over the last 8 years. No follow-up necessary. _________________________________________________________ Nodule # 2: Prior biopsy: No Location: Right; Mid Maximum size: 1.3 cm; Other 2 dimensions: 1.2 x 0.7 cm, previously, 1.2 cm Composition: solid/almost completely solid (2) Echogenicity: hypoechoic (2) Shape: not taller-than-wide (0) Margins: smooth (0) Echogenic foci: none (0) ACR TI-RADS total points: 4. ACR TI-RADS risk category:  TR4 (4-6 points). Significant change in size (>/= 20% in two dimensions and minimal increase of 2 mm): No Change in features: No Change in ACR TI-RADS risk category: No ACR TI-RADS recommendations: This nodule is stable since the 2016 study and therefore is benign and requires no further follow-up. _________________________________________________________ Nodule # 3: Prior biopsy: No Location: Right; Inferior Maximum  size: 0.7 cm; Other 2 dimensions: 0.6 x 0.5 cm, previously, 0.6 cm Composition: solid/almost completely solid (2) Echogenicity: hyperechoic (1) Shape: not taller-than-wide (0) Margins: smooth (0) Echogenic foci: none (0) ACR TI-RADS total points: 3. ACR TI-RADS risk category:  TR3 (3 points). Significant change in size (>/= 20% in two dimensions and minimal increase of 2 mm): No Change in features: Yes Change in ACR TI-RADS risk category: Yes ACR TI-RADS recommendations: Given size (<1.4 cm) and appearance, this nodule does NOT meet TI-RADS criteria for biopsy or dedicated follow-up. This nodule is stable in size and has gone from hypoechoic to hyperechoic in appearance over the last 8 years. No follow-up necessary. _________________________________________________________ Nodule # 4: Prior biopsy: Yes Location: Left; Inferior Maximum size: 2.9 cm; Other 2 dimensions: 1.5 x 1.8 cm, previously, 3.2 cm in 2016. Described as 2.8 cm in 2017 and 2019. Composition: solid/almost completely solid (2) Echogenicity: hyperechoic (1) Shape: not taller-than-wide (0) Margins: smooth (0) Echogenic foci: none (0) ACR TI-RADS total points: 3. ACR TI-RADS risk category:  TR3 (3 points). Significant change in size (>/= 20% in two dimensions and minimal increase of 2 mm): No Change in features: No Change in ACR TI-RADS risk category: No ACR TI-RADS recommendations: Given stability over 8 years, and prior benign cytology on fine-needle aspiration, this nodule no longer requires routine follow-up. _________________________________________________________ A 0.8 cm left mid nodule not previously described does not meet criteria for biopsy or dedicated follow-up. No enlarged or abnormal appearing lymph nodes are identified. IMPRESSION: Thyroid nodules as described above. No nodules meet criteria for further dedicated follow-up including the previously sampled left inferior nodule. See above. The above is in keeping with the ACR TI-RADS  recommendations - J Am Coll Radiol 2017;14:[id number]. Electronically Signed   By: Irish Lack M.D.   On: 07/25/2022 12:34

## 2022-08-01 NOTE — Assessment & Plan Note (Signed)
Her blood pressure is high The patient stated she is taking all her medications as prescribed I would defer to her primary care doctor for further management 

## 2022-08-01 NOTE — Assessment & Plan Note (Signed)
Clinically, she has no signs of disease progression. Her myeloma panel is stable/better compared to previous years I will continue her yearly visit We discussed briefly the natural history of myeloma 

## 2022-08-02 ENCOUNTER — Ambulatory Visit: Payer: Medicare HMO

## 2022-08-03 ENCOUNTER — Ambulatory Visit
Admission: EM | Admit: 2022-08-03 | Discharge: 2022-08-03 | Disposition: A | Discharge status: Home / Self Care | Payer: Medicare HMO | Attending: Internal Medicine | Admitting: Internal Medicine

## 2022-08-03 DIAGNOSIS — Z20822 Contact with and (suspected) exposure to covid-19: Secondary | ICD-10-CM

## 2022-08-03 DIAGNOSIS — R051 Acute cough: Secondary | ICD-10-CM

## 2022-08-03 NOTE — ED Triage Notes (Signed)
Patient has been exposed to covid and states she is wanting to be tested for covid.

## 2022-08-03 NOTE — ED Provider Notes (Signed)
[name]    CSN: [id number] Arrival date & time: 08/03/22  1309      History   Chief Complaint Chief Complaint  Patient presents with   Covid Exposure    HPI Andrea Lowery is a 82 y.o. female.   Patient presents today to receive a COVID test given that she has been exposed to COVID-19.  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  Her sister attends a daycare and they have had several COVID outbreaks recently.  She reports that she was exposed yesterday and has been having a little bit of a cough and nasal congestion that started yesterday as well.  Denies fever, chest pain, shortness of breath.  Blood pressure is also elevated and it was elevated at previous allergist visit 2 days ago.  She states that she has not been taking her medications as prescribed but she does have an appointment for follow-up for blood pressure in 2 days with PCP.  Patient not reporting any chest pain, shortness of breath, headache, dizziness, blurred vision, nausea, vomiting.     Past Medical History:  Diagnosis Date   Arthritis    Asthma    Asthma 01/10/2016   Last Assessment & Plan:  After recent ED eval- currrently utilizing prednisone as prescribed. Begin Levaquin, prednisone and Albuterol MDI to be utilized as needed for acute wheezing FU in 2 day is no improvement or worsening of symptoms. Pt verbalizes understanding for current treatment plan. Follow-up in one week. Plan to increase chronic inhaled steroid dose to assess further with prevention of   Diabetes mellitus without complication (HCC)    Diabetes II   Difficulty sleeping 12/28/2013   Last Assessment & Plan:  Stable with trazodone nightly. Continue current treatment plan. Refill trazodone today. Tolerating medication well without side effects. Return in 3 months for follow up or sooner if any worsening of symptoms.    DOE (dyspnea on exertion) 07/01/2012   DVT (deep venous  thrombosis) (HCC)    "Years ago"   Dyslipidemia    Eczema    GERD (gastroesophageal reflux disease)    Headache    Migraine   Hypertension    Hypokalemia 04/30/2016   Hypothyroidism    LBBB (left bundle branch block)    MGUS (monoclonal gammopathy of unknown significance)    MGUS (monoclonal gammopathy of unknown significance)    Migraine    OSA on CPAP    Sinus infection    had sinus surgery   Urinary frequency 07/25/2021    Patient Active Problem List   Diagnosis Date Noted   Right shoulder tendinitis 06/17/2022   Seasonal allergic rhinitis due to pollen 04/22/2022   Seasonal allergic conjunctivitis 04/22/2022   Moderate persistent asthma 11/05/2021   Trigeminal neuralgia of left side of face 11/05/2021   Arthritis 11/05/2021   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   Vitamin D insufficiency 10/15/2021   De Quervain's tenosynovitis, left 09/21/2021   Seasonal and perennial allergic rhinitis 08/18/2019   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   Eczema 08/02/2016   Osteoporosis without current pathological fracture 05/30/2015   Chronic right-sided low back pain with right-sided sciatica 03/31/2015   Type 2 diabetes mellitus with other specified complication (HCC) 11/07/2014   Diastolic CHF (HCC) 11/07/2014   Sacroiliac dysfunction 11/04/2014   LPRD (laryngopharyngeal reflux disease) 10/05/2014   Hypothyroidism 09/21/2014   Thyroid nodule 07/01/2014   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   Healthcare maintenance 01/25/2014  Hyperlipidemia 12/27/2013   Bilateral leg edema 07/20/2013   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/2013   Essential hypertension 07/01/2012    Past Surgical History:  Procedure Laterality Date   ABDOMINAL HYSTERECTOMY  1969   ARTERY BIOPSY N/A 11/07/2014   Procedure: BIOPSY TEMPORAL ARTERY;  Surgeon: Newman Pies, MD;  Location: [facility] OR;  Service: ENT;  Laterality: N/A;   CARPAL TUNNEL RELEASE Right    CHOLECYSTECTOMY  1976    ENDOVENOUS ABLATION SAPHENOUS VEIN W/ LASER Left 08/24/2020   endovenous laser ablation left greater saphenous vein by Cari Caraway MD   LUMBAR LAMINECTOMY/DECOMPRESSION MICRODISCECTOMY N/A 07/31/2015   Procedure: L4-5 Decompression, Possible Right L4-5 Microdiscectomy;  Surgeon: Eldred Manges, MD;  Location: [facility] OR;  Service: Orthopedics;  Laterality: N/A;   NASAL SINUS SURGERY     "years ago"   NM MYOVIEW LTD  02/13/2010   No ischemia   PLANTAR FASCIA RELEASE     TONSILLECTOMY  1965   US ECHOCARDIOGRAPHY  09/20/2008   borderline LVH,mild TR,AOV mildly sclerotic w/ca+ of the leaflets    OB History   No obstetric history on file.      Home Medications    Prior to Admission medications   Medication Sig Start Date End Date Taking? Authorizing Provider  acetaminophen (TYLENOL) 500 MG tablet Take 2 tablets (1,000 mg total) by mouth every 8 (eight) hours as needed for moderate pain. 11/05/21 11/05/22  Dickie La, MD  albuterol (VENTOLIN HFA) 108 (90 Base) MCG/ACT inhaler INHALE 2 PUFFS EVERY 4 HOURS AS NEEDED FOR WHEEZING OR SHORTNESS OF BREATH 05/08/22   Hetty Blend, FNP  amLODipine-olmesartan (AZOR) 10-40 MG tablet TAKE 1 TABLET EVERY DAY 06/14/22   Dickie La, MD  Benralizumab (FASENRA PEN) 30 MG/ML SOAJ Inject 1 mL (30 mg total) into the skin every 8 (eight) weeks. 07/04/22   Hetty Blend, FNP  Blood Glucose Monitoring Suppl (TRUE METRIX METER) w/Device KIT Use as instructed 05/22/21   Dellis Filbert, MD  budesonide-formoterol (SYMBICORT) 160-4.5 MCG/ACT inhaler INHALE 2 PUFFS BY MOUTH INTO THE LUNGS TWICE DAILY RINSE GARGLE AND SPIT AFTER USE 04/22/22   Ambs, Norvel Richards, FNP  carbamazepine (TEGRETOL XR) 200 MG 12 hr tablet Take 1 tablet (200 mg total) by mouth daily. 06/18/22   Dickie La, MD  cetirizine (ZYRTEC) 10 MG tablet Take 1 tablet (10 mg total) by mouth daily. 04/02/22   Hetty Blend, FNP  diclofenac Sodium (VOLTAREN) 1 % GEL Apply to small joints of the hands and wrist up to 4 times  daily 11/05/21   Dickie La, MD  DULoxetine (CYMBALTA) 30 MG capsule TAKE 2 CAPSULES EVERY DAY 06/14/22   Dickie La, MD  empagliflozin (JARDIANCE) 25 MG TABS tablet Take 1 tablet (25 mg total) by mouth daily before breakfast. 03/07/22   Mapp, Gaylyn Cheers, MD  EPINEPHrine 0.3 mg/0.3 mL IJ SOAJ injection Use as directed for severe allergic reaction 02/07/22   Ambs, Norvel Richards, FNP  esomeprazole (NEXIUM) 40 MG capsule Take 1 capsule (40 mg total) by mouth daily. 01/16/22   Dickie La, MD  ezetimibe (ZETIA) 10 MG tablet Take 1 tablet (10 mg total) by mouth daily. 01/16/22   Dickie La, MD  fluticasone (FLONASE) 50 MCG/ACT nasal spray Place 2 sprays into both nostrils daily. 02/07/22   Hetty Blend, FNP  glucose blood (TRUE METRIX BLOOD GLUCOSE TEST) test strip USE AS DIRECTED 03/19/22   Dickie La, MD  hydrOXYzine (ATARAX) 25 MG tablet TAKE 1 TABLET  BY MOUTH AT BEDTIME FOR ANXIETY 07/03/22   Morene Crocker, MD  ipratropium-albuterol (DUONEB) 0.5-2.5 (3) MG/3ML SOLN Take 3 mLs by nebulization every 6 (six) hours as needed. 05/17/21   Dellis Filbert, MD  levothyroxine (SYNTHROID) 50 MCG tablet Take 1 tablet every day, at least 30-60 minutes before other food or medicine. 06/03/22   Dickie La, MD  metoprolol succinate (TOPROL XL) 50 MG 24 hr tablet Take 1 tablet (50 mg total) by mouth daily. Take with or immediately following a meal. 07/24/21 07/24/22  Steffanie Rainwater, MD  montelukast (SINGULAIR) 10 MG tablet Take 1 tablet (10 mg total) by mouth at bedtime. 04/22/22   Ambs, Norvel Richards, FNP  Olopatadine-Mometasone (RYALTRIS) [phone] MCG/ACT SUSP Place 2 sprays into the nose 2 (two) times daily. 05/22/22   Hetty Blend, FNP  rosuvastatin (CRESTOR) 40 MG tablet TAKE 1 TABLET EVERY DAY (NEED MD APPOINTMENT FOR REFILLS) 01/04/22   Gust Rung, DO  spironolactone (ALDACTONE) 25 MG tablet TAKE 2 TABLETS EVERY DAY Patient not taking: Reported on 07/31/2022 03/25/22   Dickie La, MD  TRUEplus Lancets 33G MISC USE AS DIRECTED  04/29/22   Dickie La, MD  Vitamin D, Ergocalciferol, (DRISDOL) 1.25 MG (50000 UNIT) CAPS capsule Take 1 capsule (50,000 Units total) by mouth every 7 (seven) days. 06/18/22   Dickie La, MD    Family History Family History  Problem Relation Age of Onset   Dementia Mother    Gallbladder disease Mother    Diabetes Father    Heart failure Father    Hypertension Father    Sleep apnea Father    Kidney disease Father    Dementia Father    Thyroid disease Sister    Hypertension Sister    Dementia Sister    Healthy Sister     Social History Social History   Tobacco Use   Smoking status: Former    Types: Cigarettes    Quit date: 02/04/2006    Years since quitting: 16.5   Smokeless tobacco: Never  Vaping Use   Vaping Use: Never used  Substance Use Topics   Alcohol use: No    Alcohol/week: 0.0 standard drinks of alcohol   Drug use: No     Allergies   Bee venom and Penicillins   Review of Systems Review of Systems Per HPI  Physical Exam Triage Vital Signs ED Triage Vitals  Enc Vitals Group     BP 08/03/22 1518 (!) 189/84     Pulse Rate 08/03/22 1518 62     Resp 08/03/22 1518 20     Temp 08/03/22 1518 97.7 F (36.5 C)     Temp Source 08/03/22 1518 Oral     SpO2 08/03/22 1518 98 %     Weight --      Height --      Head Circumference --      Peak Flow --      Pain Score 08/03/22 1533 0     Pain Loc --      Pain Edu? --      Excl. in GC? --    No data found.  Updated Vital Signs BP (!) 189/84 (BP Location: Left Arm) Comment: Provider is aware, pt states fshe has not taken her BP medication today  Pulse 62   Temp 97.7 F (36.5 C) (Oral)   Resp 20   SpO2 98%   Visual Acuity Right Eye Distance:   Left Eye Distance:   Bilateral Distance:  Right Eye Near:   Left Eye Near:    Bilateral Near:     Physical Exam Constitutional:      General: She is not in acute distress.    Appearance: Normal appearance. She is not toxic-appearing or diaphoretic.  HENT:      Head: Normocephalic and atraumatic.  Eyes:     Extraocular Movements: Extraocular movements intact.     Conjunctiva/sclera: Conjunctivae normal.     Pupils: Pupils are equal, round, and reactive to light.  Cardiovascular:     Rate and Rhythm: Normal rate and regular rhythm.     Pulses: Normal pulses.     Heart sounds: Normal heart sounds.  Pulmonary:     Effort: Pulmonary effort is normal.     Breath sounds: Normal breath sounds.  Abdominal:     General: Bowel sounds are normal. There is no distension.     Palpations: Abdomen is soft.     Tenderness: There is no abdominal tenderness.  Neurological:     General: No focal deficit present.     Mental Status: She is alert and oriented to person, place, and time. Mental status is at baseline.     Cranial Nerves: Cranial nerves 2-12 are intact.     Sensory: Sensation is intact.     Motor: Motor function is intact.     Coordination: Coordination is intact.     Gait: Gait is intact.  Psychiatric:        Mood and Affect: Mood normal.        Behavior: Behavior normal.        Thought Content: Thought content normal.        Judgment: Judgment normal.      UC Treatments / Results  Labs (all labs ordered are listed, but only abnormal results are displayed) Labs Reviewed  SARS CORONAVIRUS 2 (TAT 6-24 HRS)    EKG   Radiology No results found.  Procedures Procedures (including critical care time)  Medications Ordered in UC Medications - No data to display  Initial Impression / Assessment and Plan / UC Course  I have reviewed the triage vital signs and the nursing notes.  Pertinent labs & imaging results that were available during my care of the patient were reviewed by me and considered in my medical decision making (see chart for details).     Patient is here with viral symptoms and COVID exposure.  COVID test pending.  Awaiting results.  Advised supportive care and symptom management and strict follow-up if any  symptoms persist or worsen.  Blood pressure is elevated but patient reports that she has not been taking her blood pressure medication as prescribed so encouraged patient to take this as prescribed and monitor blood pressure closely.  She is asymptomatic regarding blood pressure so do not think that emergent evaluation is necessary.  Encouraged strict ER precautions and follow-up with PCP.  Patient verbalized understanding and was agreeable with plan. Final Clinical Impressions(s) / UC Diagnoses   Final diagnoses:  Close exposure to COVID-19 virus  Acute cough     Discharge Instructions      COVID test is pending.  We will call if it is positive.    ED Prescriptions   None    PDMP not reviewed this encounter.   Gustavus Bryant, [place] 08/03/22 1600

## 2022-08-03 NOTE — Discharge Instructions (Signed)
COVID test is pending.  We will call if it is positive. 

## 2022-08-04 LAB — SARS CORONAVIRUS 2 (TAT 6-24 HRS): SARS Coronavirus 2: NEGATIVE

## 2022-08-05 ENCOUNTER — Other Ambulatory Visit: Payer: Self-pay | Admitting: Family Medicine

## 2022-08-05 ENCOUNTER — Ambulatory Visit (INDEPENDENT_AMBULATORY_CARE_PROVIDER_SITE_OTHER): Payer: Medicare HMO

## 2022-08-05 DIAGNOSIS — T63441D Toxic effect of venom of bees, accidental (unintentional), subsequent encounter: Secondary | ICD-10-CM

## 2022-08-11 ENCOUNTER — Other Ambulatory Visit: Payer: Self-pay | Admitting: Student in an Organized Health Care Education/Training Program

## 2022-08-11 ENCOUNTER — Other Ambulatory Visit: Payer: Self-pay | Admitting: Internal Medicine

## 2022-08-11 DIAGNOSIS — E039 Hypothyroidism, unspecified: Secondary | ICD-10-CM

## 2022-08-14 NOTE — Progress Notes (Signed)
I reviewed the AWV findings from the provider who conducted the visit. I was present in the office suite and immediately available to provide assistance and direction throughout the time the service was provided. Last mammogram 10/26/2020, repeat annually and scheduled for 10/30/21. Last colonoscopy 02/22/2015 with [facility] per chart review, repeat in 10 years.   

## 2022-08-20 ENCOUNTER — Ambulatory Visit (INDEPENDENT_AMBULATORY_CARE_PROVIDER_SITE_OTHER): Payer: Medicare HMO | Admitting: Licensed Clinical Social Worker

## 2022-08-20 DIAGNOSIS — F330 Major depressive disorder, recurrent, mild: Secondary | ICD-10-CM

## 2022-08-20 NOTE — BH Specialist Note (Signed)
Integrated Behavioral Health via Telemedicine Visit  08/20/2022 Andrea Lowery [id number]  Number of Integrated Behavioral Health Clinician visits: 2- Second Visit  Session Start time: 1434   Session End time: 1534  Total time in minutes: 60   Referring Provider: Dickie La, MD  Patient/Family location: Home [facility] Provider location: Office All persons participating in visit: Patient and [facility]  Types of Service: Telephone visit and Health & Behavioral Assessment/Intervention  I connected with Andrea Lowery and/or Andrea Lowery's  via  Telephone or Video Enabled Telemedicine Application  (Video is Caregility application) and verified that I am speaking with the correct person using two identifiers. Discussed confidentiality: Yes   I discussed the limitations of telemedicine and the availability of in person appointments.  Discussed there is a possibility of technology failure and discussed alternative modes of communication if that failure occurs.  I discussed that engaging in this telemedicine visit, they consent to the provision of behavioral healthcare and the services will be billed under their insurance.  Patient and/or legal guardian expressed understanding and consented to Telemedicine visit: Yes   Presenting Concerns: Patient and/or family reports the following symptoms/concerns: Patient currently going through stages of grief. [facility] referred patient to [facility] or [facility]. Both agencies offer grief therapy. Patient currently caring for sister who was exposed to COVID 19.   Patient has since joined the choir.   Patient reports doing well at the moment.   Patient agrees to continue speaking with [facility]. Duration of problem: less than 10 years; Severity of problem: mild  Patient and/or Family's Strengths/Protective Factors: Sense of purpose  Goals Addressed: Patient will:  Reduce symptoms of:  Grief    Increase knowledge and/or ability  of: coping skills   Demonstrate ability to: Increase healthy adjustment to current life circumstances  Progress towards Goals: Ongoing  Interventions: Interventions utilized:  Mindfulness or Management consultant, CBT Cognitive Behavioral Therapy, Link to [employer], and Communication Skills Standardized Assessments completed: PHQ-SADS     07/31/2022    2:48 PM 07/03/2022    1:49 PM 06/17/2022   10:22 AM  PHQ-SADS Last 3 Score only  PHQ Adolescent Score 6 0 8     Patient and/or Family Response: Patient willing to explore grief support groups.  Assessment: Patient currently experiencing Grief.   Patient may benefit from Grief therapy.  Plan: Follow up with behavioral health clinician on : within the next 30 days.   I discussed the assessment and treatment plan with the patient and/or parent/guardian. They were provided an opportunity to ask questions and all were answered. They agreed with the plan and demonstrated an understanding of the instructions.   They were advised to call back or seek an in-person evaluation if the symptoms worsen or if the condition fails to improve as anticipated.  Christen Butter, MSW, LCSW-A She/Her Behavioral Health Clinician [facility]  Internal Medicine Center Direct [phone]  Fax [phone] Main Office Phone: [phone] [address] [place] [place], [place] [address] Website: [facility] Internal Medicine [facility]  [place], [place]  [place]

## 2022-08-22 ENCOUNTER — Telehealth: Payer: Self-pay | Admitting: *Deleted

## 2022-08-22 ENCOUNTER — Telehealth: Payer: Self-pay

## 2022-08-22 NOTE — Telephone Encounter (Signed)
Received call from patient stating she has had "nagging" right sciatica pain radiating down right leg x 1 week. Pain has increased over last 24 hours to the point that she is "screaming" when trying to sit on toilet. States it hurts to sit, sleep, and walk. She is afraid she may fall. There are no openings in clinic till 7/29. Patient is advised to go to UC/ED today. States she will have someone drive her now.

## 2022-08-22 NOTE — Telephone Encounter (Signed)
Caller: Patient  Concern: Pain starting at hip and shooting downward, especially in R leg, BL ankle swelling, and trouble changing positions such as sitting to standing.  Pt denies any open wounds, rest pain, or discoloration  Location: left leg, right leg  Description:  Ongoing for several months, been putting off going to the doctor  Aggravating Factors: walking, sitting  Quality: shooting  Treatments:  elevation, compressions  Resolution: Appointment scheduled for LE reflux and PA and Instructed patient to contact primary care MD since it may be sciatic nerve pain or orthopedic pain  Next Appt: Appointment scheduled for 8/28, pt will call to cancel if no need for vascular appt after seeing PCP.

## 2022-08-23 ENCOUNTER — Encounter: Payer: Self-pay | Admitting: Student

## 2022-08-23 ENCOUNTER — Ambulatory Visit (INDEPENDENT_AMBULATORY_CARE_PROVIDER_SITE_OTHER): Payer: Medicare HMO | Admitting: Student

## 2022-08-23 VITALS — BP 160/68 | HR 66 | Temp 98.1°F | Wt 187.6 lb

## 2022-08-23 DIAGNOSIS — Z87891 Personal history of nicotine dependence: Secondary | ICD-10-CM

## 2022-08-23 DIAGNOSIS — G8929 Other chronic pain: Secondary | ICD-10-CM

## 2022-08-23 DIAGNOSIS — I10 Essential (primary) hypertension: Secondary | ICD-10-CM | POA: Diagnosis not present

## 2022-08-23 DIAGNOSIS — M5441 Lumbago with sciatica, right side: Secondary | ICD-10-CM | POA: Diagnosis not present

## 2022-08-23 MED ORDER — DULOXETINE HCL 30 MG PO CPEP: 30.0000 mg | ORAL_CAPSULE | Freq: Every day | ORAL | 0 refills | Status: DC

## 2022-08-23 MED ORDER — METHOCARBAMOL 500 MG PO TABS: 500.0000 mg | ORAL_TABLET | Freq: Three times a day (TID) | ORAL | 0 refills | Status: AC | PRN

## 2022-08-23 NOTE — Patient Instructions (Addendum)
Thank you, Andrea Lowery for allowing [place] to provide your care today. Today we discussed:   -Will send over prescription for Duloxetine (Cymbalta) 30 mg daily since not currently taking it. -Will send over prescription for Robaxin 500 mg once or up to three times a day if needed for spasms and pain. Please be aware it may cause drowsiness.   -You can purchase a cane at your local store or pharmacy.  -Follow/Contact Dr. Sherlean Foot for follow up along with physical therapy.  -Blood pressure still high. Please make sure to take your blood pressure medicine. Will plan to come back for blood pressure check with our nurse in 2 weeks.   I have ordered the following medication/changed the following medications:   Stop the following medications: There are no discontinued medications.   Start the following medications: Meds ordered this encounter  Medications   methocarbamol (ROBAXIN) 500 MG tablet    Sig: Take 1 tablet (500 mg total) by mouth every 8 (eight) hours as needed for up to 7 days for muscle spasms.    Dispense:  21 tablet    Refill:  0   DULoxetine (CYMBALTA) 30 MG capsule    Sig: Take 1 capsule (30 mg total) by mouth daily.    Dispense:  90 capsule    Refill:  0     Follow up:  2 week for blood pressure recheck with nurse; ~1 month for office visit    Should you have any questions or concerns please call the internal medicine clinic at [phone].    Rana Snare, D.O. [facility] Internal [facility] Center

## 2022-08-23 NOTE — Telephone Encounter (Signed)
Appt became available today at 0945. Patient will take this appt.

## 2022-08-23 NOTE — Assessment & Plan Note (Addendum)
Presents today for worsening right hip and leg pain for past few days.  Notes spasms more right-sided low back.  No recent injury or trauma.   Denies any red flag symptoms including urinary retention, incontinence or saddle paresthesia.  She has chronic history of lower back pain with right-sided sciatica.  She was prescribed duloxetine, but patient states today she is not taking it because she does not have it at home.   Orthopedics referral was sent at prior visit but patient has not set up appointment with them.  Plan -Rx sent for duloxetine 30 mg daily to local pharmacy -Robaxin 500 mg q8h PRN x 7 days -Information for orthopedics provided -Reassess pain at next OV

## 2022-08-23 NOTE — Progress Notes (Signed)
CC: Right hip and leg pain  HPI:  [name] is a 82 y.o. female living with a history stated below and presents today for right hip and leg pain. Please see problem based assessment and plan for additional details.  Past Medical History:  Diagnosis Date   Arthritis    Asthma    Asthma 01/10/2016   Last Assessment & Plan:  After recent ED eval- currrently utilizing prednisone as prescribed. Begin Levaquin, prednisone and Albuterol MDI to be utilized as needed for acute wheezing FU in 2 day is no improvement or worsening of symptoms. Pt verbalizes understanding for current treatment plan. Follow-up in one week. Plan to increase chronic inhaled steroid dose to assess further with prevention of   Diabetes mellitus without complication (HCC)    Diabetes II   Difficulty sleeping 12/28/2013   Last Assessment & Plan:  Stable with trazodone nightly. Continue current treatment plan. Refill trazodone today. Tolerating medication well without side effects. Return in 3 months for follow up or sooner if any worsening of symptoms.    DOE (dyspnea on exertion) 07/01/2012   DVT (deep venous thrombosis) (HCC)    "Years ago"   Dyslipidemia    Eczema    GERD (gastroesophageal reflux disease)    Headache    Migraine   Hypertension    Hypokalemia 04/30/2016   Hypothyroidism    LBBB (left bundle branch block)    MGUS (monoclonal gammopathy of unknown significance)    MGUS (monoclonal gammopathy of unknown significance)    Migraine    OSA on CPAP    Sinus infection    had sinus surgery   Urinary frequency 07/25/2021    Current Outpatient Medications on File Prior to Visit  Medication Sig Dispense Refill   acetaminophen (TYLENOL) 500 MG tablet Take 2 tablets (1,000 mg total) by mouth every 8 (eight) hours as needed for moderate pain. 100 tablet 2   albuterol (VENTOLIN HFA) 108 (90 Base) MCG/ACT inhaler INHALE 2 PUFFS EVERY 4 HOURS AS NEEDED FOR WHEEZING OR SHORTNESS OF BREATH 18 g 1    amLODipine-olmesartan (AZOR) 10-40 MG tablet TAKE 1 TABLET EVERY DAY 90 tablet 3   Benralizumab (FASENRA PEN) 30 MG/ML SOAJ Inject 1 mL (30 mg total) into the skin every 8 (eight) weeks. 1 mL 6   Blood Glucose Monitoring Suppl (TRUE METRIX METER) w/Device KIT Use as instructed 1 kit 1   budesonide-formoterol (SYMBICORT) 160-4.5 MCG/ACT inhaler INHALE 2 PUFFS BY MOUTH INTO THE LUNGS TWICE DAILY RINSE GARGLE AND SPIT AFTER USE 30.6 g 1   carbamazepine (TEGRETOL XR) 200 MG 12 hr tablet Take 1 tablet (200 mg total) by mouth daily. 90 tablet 0   cetirizine (ZYRTEC) 10 MG tablet Take 1 tablet (10 mg total) by mouth daily. 30 tablet 5   diclofenac Sodium (VOLTAREN) 1 % GEL Apply to small joints of the hands and wrist up to 4 times daily 100 g 2   DULoxetine (CYMBALTA) 30 MG capsule TAKE 2 CAPSULES EVERY DAY 180 capsule 3   empagliflozin (JARDIANCE) 25 MG TABS tablet Take 1 tablet (25 mg total) by mouth daily before breakfast. 60 tablet 2   EPINEPHrine 0.3 mg/0.3 mL IJ SOAJ injection Use as directed for severe allergic reaction 3 each 1   esomeprazole (NEXIUM) 40 MG capsule Take 1 capsule (40 mg total) by mouth daily. 90 capsule 3   ezetimibe (ZETIA) 10 MG tablet Take 1 tablet (10 mg total) by mouth daily. 30 tablet 11  fluticasone (FLONASE) 50 MCG/ACT nasal spray Place 2 sprays into both nostrils daily. 48 g 1   glucose blood (TRUE METRIX BLOOD GLUCOSE TEST) test strip USE AS DIRECTED 300 strip 3   hydrOXYzine (ATARAX) 25 MG tablet TAKE 1 TABLET BY MOUTH AT BEDTIME FOR ANXIETY 30 tablet 0   ipratropium-albuterol (DUONEB) 0.5-2.5 (3) MG/3ML SOLN Take 3 mLs by nebulization every 6 (six) hours as needed. 360 mL 2   levothyroxine (SYNTHROID) 50 MCG tablet TAKE 1 TABLET EVERY DAY, AT LEAST 30-60 MINUTES BEFORE OTHER FOOD OR MEDICINE. 90 tablet 3   metoprolol succinate (TOPROL XL) 50 MG 24 hr tablet Take 1 tablet (50 mg total) by mouth daily. Take with or immediately following a meal. 30 tablet 11    montelukast (SINGULAIR) 10 MG tablet Take 1 tablet (10 mg total) by mouth at bedtime. 90 tablet 1   Olopatadine-Mometasone (RYALTRIS) 665-25 MCG/ACT SUSP Place 2 sprays into the nose 2 (two) times daily. 29 g 2   rosuvastatin (CRESTOR) 40 MG tablet TAKE 1 TABLET EVERY DAY (NEED MD APPOINTMENT FOR REFILLS) 90 tablet 3   spironolactone (ALDACTONE) 25 MG tablet TAKE 2 TABLETS EVERY DAY (Patient not taking: Reported on 07/31/2022) 60 tablet 11   TRUEplus Lancets 33G MISC USE AS DIRECTED 300 each 3   Vitamin D, Ergocalciferol, (DRISDOL) 1.25 MG (50000 UNIT) CAPS capsule Take 1 capsule (50,000 Units total) by mouth every 7 (seven) days. 8 capsule 0   Current Facility-Administered Medications on File Prior to Visit  Medication Dose Route Frequency Provider Last Rate Last Admin   Benralizumab SOSY 30 mg  30 mg Subcutaneous Q8 Weeks Kozlow, Alvira Philips, MD   30 mg at 07/22/22 1045   Review of Systems: ROS negative except for what is noted on the assessment and plan.  Vitals:   08/23/22 0957 08/23/22 1030  BP: (!) 174/84 (!) 160/68  Pulse: 64 66  Temp: 98.1 F (36.7 C)   TempSrc: Oral   SpO2: 98%   Weight: 187 lb 9.6 oz (85.1 kg)    Physical Exam: Constitutional: alert, female sitting in wheelchair at times uncomfortable, in no acute distress Cardiovascular: regular rate and rhythm Pulmonary/Chest: normal work of breathing on room air MSK: TTP of right lumbar paraspinal and right hip, intact ROM and sensation of RLE Neurological: awake and alert Psych: pleasant mood  Assessment & Plan:   Essential hypertension Repeat BP still elevated at 160/68, has not taken her antihypertensives today. Denies any chest pain, SOB or acute vision changes. She is prescribed amlodipine-olmesartan 10-40 mg, spironolactone 50 mg, Toprol 50 mg daily.  States she takes her blood pressure medicine, encouraged daily adherence and discussed with patient to bring her meds to her next OV for review.   Plan -Continue  amlodipine-olmesartan, spironolactone, Toprol  -2 week RN BP check  -Encouraged patient to bring her medications to next OV  Chronic right-sided low back pain with right-sided sciatica Presents today for worsening right hip and leg pain for past few days.  Notes spasms more right-sided low back.  No recent injury or trauma.   Denies any red flag symptoms including urinary retention, incontinence or saddle paresthesia.  She has chronic history of lower back pain with right-sided sciatica.  She was prescribed duloxetine, but patient states today she is not taking it because she does not have it at home.   Orthopedics referral was sent at prior visit but patient has not set up appointment with them.  Plan -Rx sent for  duloxetine 30 mg daily to local pharmacy -Robaxin 500 mg q8h PRN x 7 days -Information for orthopedics provided -Reassess pain at next OV   Patient discussed with Dr. Docia Furl, D.O. [facility] Internal Medicine, PGY-2 Phone: [phone] Date 08/23/2022 Time 4:16 PM

## 2022-08-23 NOTE — Assessment & Plan Note (Addendum)
Repeat BP still elevated at 160/68, has not taken her antihypertensives today. Denies any chest pain, SOB or acute vision changes. She is prescribed amlodipine-olmesartan 10-40 mg, spironolactone 50 mg, Toprol 50 mg daily.  States she takes her blood pressure medicine, encouraged daily adherence and discussed with patient to bring her meds to her next OV for review.   Plan -Continue amlodipine-olmesartan, spironolactone, Toprol  -2 week RN BP check  -Encouraged patient to bring her medications to next OV

## 2022-08-28 NOTE — Progress Notes (Signed)
Internal Medicine Clinic Attending  Case discussed with the resident at the time of the visit.  We reviewed the resident's history and exam and pertinent patient test results.  I agree with the assessment, diagnosis, and plan of care documented in the resident's note.  

## 2022-09-06 ENCOUNTER — Ambulatory Visit: Payer: Medicare HMO

## 2022-09-11 ENCOUNTER — Other Ambulatory Visit: Payer: Self-pay | Admitting: *Deleted

## 2022-09-11 DIAGNOSIS — R600 Localized edema: Secondary | ICD-10-CM

## 2022-09-12 ENCOUNTER — Ambulatory Visit (INDEPENDENT_AMBULATORY_CARE_PROVIDER_SITE_OTHER): Payer: Medicare HMO | Admitting: Licensed Clinical Social Worker

## 2022-09-12 ENCOUNTER — Other Ambulatory Visit: Payer: Self-pay

## 2022-09-12 DIAGNOSIS — F330 Major depressive disorder, recurrent, mild: Secondary | ICD-10-CM | POA: Diagnosis not present

## 2022-09-12 MED ORDER — EMPAGLIFLOZIN 25 MG PO TABS: 25.0000 mg | ORAL_TABLET | Freq: Every day | ORAL | 3 refills | Status: DC
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
## 2022-09-13 ENCOUNTER — Other Ambulatory Visit: Payer: Self-pay

## 2022-09-13 NOTE — Progress Notes (Signed)
   Andrea Lowery 1940/07/09 [id number]  Patient outreached by Erasmo Leventhal , PharmD Candidate on 09/13/2022. Pt needs refills on hydroxyzine, expresses interest in CGM monitor and BP cuff. Encouraged her to talk about these concerns with her PCP Blood Pressure Readings: Last documented ambulatory systolic blood pressure: 160 Last documented ambulatory diastolic blood pressure: 68 Does the patient have a validated home blood pressure machine?: No   Medication review was performed. Is the patient taking their medications as prescribed?: Yes The following barriers to adherence were noted: Does the patient have cost concerns?: No Does the patient have transportation concerns?: No Does the patient need assistance obtaining refills?: No Does the patient occassionally forget to take some of their prescribed medications?: Yes Does the patient feel like one/some of their medications make them feel poorly?: No Does the patient have questions or concerns about their medications?: No Does the patient have a follow up scheduled with their primary care provider/cardiologist?: Yes   Interventions: Interventions Completed: Medications were reviewed, Patient was educated on how to access home blood pressure machine, Patient was counseled on lifestyle modifications to improve blood pressure, including  The patient has follow up scheduled:  PCP: Dickie La, MD   Erasmo Leventhal, Student-PharmD

## 2022-09-16 ENCOUNTER — Ambulatory Visit (INDEPENDENT_AMBULATORY_CARE_PROVIDER_SITE_OTHER): Payer: Medicare HMO

## 2022-09-16 DIAGNOSIS — J4550 Severe persistent asthma, uncomplicated: Secondary | ICD-10-CM | POA: Diagnosis not present

## 2022-09-17 ENCOUNTER — Encounter: Payer: Self-pay | Admitting: Internal Medicine

## 2022-09-17 ENCOUNTER — Ambulatory Visit (INDEPENDENT_AMBULATORY_CARE_PROVIDER_SITE_OTHER): Payer: Medicare HMO | Admitting: Internal Medicine

## 2022-09-17 VITALS — BP 151/58 | HR 65 | Temp 98.2°F | Ht 66.0 in | Wt 187.8 lb

## 2022-09-17 DIAGNOSIS — E1169 Type 2 diabetes mellitus with other specified complication: Secondary | ICD-10-CM

## 2022-09-17 DIAGNOSIS — J302 Other seasonal allergic rhinitis: Secondary | ICD-10-CM | POA: Diagnosis not present

## 2022-09-17 DIAGNOSIS — R748 Abnormal levels of other serum enzymes: Secondary | ICD-10-CM | POA: Diagnosis not present

## 2022-09-17 DIAGNOSIS — Z7984 Long term (current) use of oral hypoglycemic drugs: Secondary | ICD-10-CM

## 2022-09-17 DIAGNOSIS — J3089 Other allergic rhinitis: Secondary | ICD-10-CM

## 2022-09-17 DIAGNOSIS — F331 Major depressive disorder, recurrent, moderate: Secondary | ICD-10-CM

## 2022-09-17 DIAGNOSIS — F4329 Adjustment disorder with other symptoms: Secondary | ICD-10-CM | POA: Diagnosis not present

## 2022-09-17 DIAGNOSIS — E559 Vitamin D deficiency, unspecified: Secondary | ICD-10-CM | POA: Diagnosis not present

## 2022-09-17 DIAGNOSIS — Z87891 Personal history of nicotine dependence: Secondary | ICD-10-CM

## 2022-09-17 DIAGNOSIS — I447 Left bundle-branch block, unspecified: Secondary | ICD-10-CM

## 2022-09-17 DIAGNOSIS — E041 Nontoxic single thyroid nodule: Secondary | ICD-10-CM

## 2022-09-17 DIAGNOSIS — E1165 Type 2 diabetes mellitus with hyperglycemia: Secondary | ICD-10-CM

## 2022-09-17 DIAGNOSIS — E785 Hyperlipidemia, unspecified: Secondary | ICD-10-CM | POA: Diagnosis not present

## 2022-09-17 DIAGNOSIS — Z0000 Encounter for general adult medical examination without abnormal findings: Secondary | ICD-10-CM

## 2022-09-17 DIAGNOSIS — H1010 Acute atopic conjunctivitis, unspecified eye: Secondary | ICD-10-CM

## 2022-09-17 DIAGNOSIS — G500 Trigeminal neuralgia: Secondary | ICD-10-CM

## 2022-09-17 DIAGNOSIS — I10 Essential (primary) hypertension: Secondary | ICD-10-CM | POA: Diagnosis not present

## 2022-09-17 DIAGNOSIS — E039 Hypothyroidism, unspecified: Secondary | ICD-10-CM | POA: Diagnosis not present

## 2022-09-17 DIAGNOSIS — M810 Age-related osteoporosis without current pathological fracture: Secondary | ICD-10-CM

## 2022-09-17 LAB — GLUCOSE, CAPILLARY: Glucose-Capillary: 144 mg/dL — ABNORMAL HIGH (ref 70–99)

## 2022-09-17 LAB — POCT GLYCOSYLATED HEMOGLOBIN (HGB A1C): Hemoglobin A1C: 7.4 % — AB (ref 4.0–5.6)

## 2022-09-17 MED ORDER — SPIRONOLACTONE 50 MG PO TABS: 75.0000 mg | ORAL_TABLET | Freq: Every day | ORAL | 0 refills | Status: DC

## 2022-09-17 MED ORDER — HYDROXYZINE HCL 25 MG PO TABS
ORAL_TABLET | ORAL | 0 refills | Status: DC
Start: 2022-09-17 — End: 2023-04-17

## 2022-09-17 MED ORDER — VITAMIN D (CHOLECALCIFEROL) 25 MCG (1000 UT) PO CAPS
1.0000 | ORAL_CAPSULE | Freq: Every day | ORAL | 3 refills | Status: DC
Start: 2022-09-17 — End: 2023-04-17

## 2022-09-17 NOTE — Progress Notes (Signed)
Attestation for Student Documentation:  I personally was present and re-performed the history, physical exam and medical decision-making activities of this service and have verified that the service and findings are accurately documented in the student's note.  Dickie La, MD 09/17/2022, 3:10 PM

## 2022-09-17 NOTE — Assessment & Plan Note (Deleted)
She complains of itchy eyes and feelings of vertigo at times. This is chronic issue for her. She has no fever or cough. Not likely any respiratory infections. She follows up with allergy

## 2022-09-17 NOTE — Assessment & Plan Note (Signed)
Mild elevation in TSH at last visit. At that time, Andrea Lowery was taking levothyroxine with other medications and missing doses 1-2x/week. Plan to recheck today. Thyroid US with stable nodules, no changes and no recommendation for f/u.  Plan -TSH

## 2022-09-17 NOTE — Assessment & Plan Note (Signed)
Patient will need to call and schedule DEXA, reminded of this today with scheduling information provided.

## 2022-09-17 NOTE — Assessment & Plan Note (Signed)
Andrea Lowery lost her grandson 2 years ago, her son had a open heart surgery recently and her sister coded and was hospitalized yesterday. This has caused her stress and anxiety. She has no thoughts of self-harm. She was on hydroxyzine before but no longer is taking it. She is also on duloxetine but is not adherent.  - take duloxetine 30 mg daily - take hydrozyine 25 mg daily prn

## 2022-09-17 NOTE — Assessment & Plan Note (Addendum)
PHQ9 of 10 today, consistent with moderate depression. She has not been taking duloxetine as discussed at prior visits. She has established with IBH and attending counseling appointments. Continues to have significant caretaker stress with her sister, as well as recent illness of her son. Encouraged adherence to medication and will f/u in one month.  Plan -Duloxetine 30 mg daily -Continue IBH

## 2022-09-17 NOTE — Assessment & Plan Note (Signed)
Resumed carbamazepine at last visit for trigeminal neuralgia. Also prescribed duloxetine for this and right-sided sciatica but she has not been taking this regularly. She is interested in getting off medications and we discussed taking duloxetine regularly and then trying to come off carbamazepine. Repeat liver profile today, mildly elevated AST at prior visit.  Plan -Continue carbamazepine for now, consider titrating off at f/u visit -Liver profile  Addendum 8/14 On f/u phone call, Andrea Lowery reports she really hasn't been taking carbamazepine as discussed. Given mild liver enzyme elevation, we have discussed not resuming this medication and trial of duloxetine as outlined elsewhere.

## 2022-09-17 NOTE — Assessment & Plan Note (Signed)
Follows with allergy/immunology for asthma and allergic rhinitis/conjunctivitis. Experiencing some worsening of her left-sided sinus symptoms today. She is using Ryaltris nasal spray, Zyrtec, montelukast, and receiving Fasenra injections. Encourage allergy eye drops as needed for eye symptoms. Overdue for allergy f/u per last note.   Plan -Continue medications as above. F/u with allergy.

## 2022-09-17 NOTE — Assessment & Plan Note (Signed)
Thyroid US 07/2022 with stable nodules, no recommendation for f/u.

## 2022-09-17 NOTE — Assessment & Plan Note (Addendum)
She has history of Vitamin D insufficiency, osteoarthritis and chronic hip pain. She denies any recent falls. Her vitamin D levels in May was 19.5. She was on vit D 50000 units weekly and finished her prescription.  - recheck vit D levels - Start 1000 units Vit D

## 2022-09-17 NOTE — Patient Instructions (Addendum)
Thank you, Andrea Lowery for allowing [place] to provide your care today. Today we discussed the following.   Blood pressure: Increase dose of spironolactone to 75 mg which will be 1.5 tablet.  Labs: We will check labs for kidney function, glucose, vitamin D, thyroids and cholesterol. We will call you with results.  Anxiety: Please restart taking duloxetine daily. You can restart hydroxyzine once daily as needed. You can pick up the medicine from your [place] pharmacy.  You will need to contact your eye doctor to schedule a diabetes eye appointment. Itchy and red eyes: For your eye symptoms, you may try Pataday or Zatidor eye drops for allergy relief. I have referred you to a cardiologist to establish care. If you notice increased palpitations, dizziness or headaches, please go to the ER.  Please call below to schedule your bone density scan:  [facility] [address] [place], [place] [address] Phone [phone]  I have ordered the following labs for you:   Lab Orders         TSH         Lipid Profile         Vitamin D (25 hydroxy)         CMP14 + Anion Gap         Glucose, capillary         POC Hbg A1C       Referrals ordered today:   Referral Orders  No referral(s) requested today     I have ordered the following medication/changed the following medications:   Stop the following medications: Medications Discontinued During This Encounter  Medication Reason   Vitamin D, Ergocalciferol, (DRISDOL) 1.25 MG (50000 UNIT) CAPS capsule    DULoxetine (CYMBALTA) 30 MG capsule    hydrOXYzine (ATARAX) 25 MG tablet Reorder     Start the following medications: Meds ordered this encounter  Medications   Vitamin D, Cholecalciferol, 25 MCG (1000 UT) CAPS    Sig: Take 1 capsule by mouth daily.    Dispense:  90 capsule    Refill:  3   hydrOXYzine (ATARAX) 25 MG tablet    Sig: TAKE 1 TABLET BY MOUTH AT BEDTIME FOR ANXIETY    Dispense:  30  tablet    Refill:  0     Follow up: 1 month  We look forward to seeing you next time. Please call our clinic at [phone] if you have any questions or concerns. The best time to call is Monday-Friday from 9am-4pm, but there is someone available 24/7. If after hours or the weekend, call the main hospital number and ask for the Internal Medicine Resident On-Call. If you need medication refills, please notify your pharmacy one week in advance and they will send [place] a request.   Thank you for trusting me with your care. Wishing you the best! [facility]

## 2022-09-17 NOTE — Progress Notes (Signed)
Subjective:   Patient ID: Andrea Lowery female   DOB: [birth year] 82 y.o.   MRN: [id number]  HPI: Andrea Lowery is a 82 y.o. female with PMH of type II DM, HTN, HLD, allergic rhinitis, asthma and LBBB presenting to clinic for f/u after having high blood pressure readings in last visit (08/23/2022). Other PMH is as followed. Please see assessment and plan for details of the visit.     Past Medical History:  Diagnosis Date   Arthritis    Asthma    Asthma 01/10/2016   Last Assessment & Plan:  After recent ED eval- currrently utilizing prednisone as prescribed. Begin Levaquin, prednisone and Albuterol MDI to be utilized as needed for acute wheezing FU in 2 day is no improvement or worsening of symptoms. Pt verbalizes understanding for current treatment plan. Follow-up in one week. Plan to increase chronic inhaled steroid dose to assess further with prevention of   Diabetes mellitus without complication (HCC)    Diabetes II   Difficulty sleeping 12/28/2013   Last Assessment & Plan:  Stable with trazodone nightly. Continue current treatment plan. Refill trazodone today. Tolerating medication well without side effects. Return in 3 months for follow up or sooner if any worsening of symptoms.    DOE (dyspnea on exertion) 07/01/2012   DVT (deep venous thrombosis) (HCC)    "Years ago"   Dyslipidemia    Eczema    GERD (gastroesophageal reflux disease)    Headache    Migraine   Hypertension    Hypokalemia 04/30/2016   Hypothyroidism    LBBB (left bundle branch block)    MGUS (monoclonal gammopathy of unknown significance)    MGUS (monoclonal gammopathy of unknown significance)    Migraine    OSA on CPAP    Sinus infection    had sinus surgery   Urinary frequency 07/25/2021   Current Outpatient Medications  Medication Sig Dispense Refill   Vitamin D, Cholecalciferol, 25 MCG (1000 UT) CAPS Take 1 capsule by mouth daily. 90 capsule 3   acetaminophen (TYLENOL) 500 MG tablet  Take 2 tablets (1,000 mg total) by mouth every 8 (eight) hours as needed for moderate pain. 100 tablet 2   albuterol (VENTOLIN HFA) 108 (90 Base) MCG/ACT inhaler INHALE 2 PUFFS EVERY 4 HOURS AS NEEDED FOR WHEEZING OR SHORTNESS OF BREATH 18 g 1   amLODipine-olmesartan (AZOR) 10-40 MG tablet TAKE 1 TABLET EVERY DAY 90 tablet 3   Benralizumab (FASENRA PEN) 30 MG/ML SOAJ Inject 1 mL (30 mg total) into the skin every 8 (eight) weeks. 1 mL 6   Blood Glucose Monitoring Suppl (TRUE METRIX METER) w/Device KIT Use as instructed 1 kit 1   budesonide-formoterol (SYMBICORT) 160-4.5 MCG/ACT inhaler INHALE 2 PUFFS BY MOUTH INTO THE LUNGS TWICE DAILY RINSE GARGLE AND SPIT AFTER USE 30.6 g 1   carbamazepine (TEGRETOL XR) 200 MG 12 hr tablet Take 1 tablet (200 mg total) by mouth daily. 90 tablet 0   cetirizine (ZYRTEC) 10 MG tablet Take 1 tablet (10 mg total) by mouth daily. 30 tablet 5   diclofenac Sodium (VOLTAREN) 1 % GEL Apply to small joints of the hands and wrist up to 4 times daily 100 g 2   DULoxetine (CYMBALTA) 30 MG capsule Take 1 capsule (30 mg total) by mouth daily. 90 capsule 0   empagliflozin (JARDIANCE) 25 MG TABS tablet Take 1 tablet (25 mg total) by mouth daily before breakfast. 90 tablet 3   EPINEPHrine 0.3 mg/0.3 mL IJ  SOAJ injection Use as directed for severe allergic reaction 3 each 1   esomeprazole (NEXIUM) 40 MG capsule Take 1 capsule (40 mg total) by mouth daily. 90 capsule 3   ezetimibe (ZETIA) 10 MG tablet Take 1 tablet (10 mg total) by mouth daily. 30 tablet 11   fluticasone (FLONASE) 50 MCG/ACT nasal spray Place 2 sprays into both nostrils daily. 48 g 1   glucose blood (TRUE METRIX BLOOD GLUCOSE TEST) test strip USE AS DIRECTED 300 strip 3   hydrOXYzine (ATARAX) 25 MG tablet TAKE 1 TABLET BY MOUTH AT BEDTIME FOR ANXIETY 30 tablet 0   ipratropium-albuterol (DUONEB) 0.5-2.5 (3) MG/3ML SOLN Take 3 mLs by nebulization every 6 (six) hours as needed. 360 mL 2   levothyroxine (SYNTHROID) 50 MCG  tablet TAKE 1 TABLET EVERY DAY, AT LEAST 30-60 MINUTES BEFORE OTHER FOOD OR MEDICINE. 90 tablet 3   metoprolol succinate (TOPROL XL) 50 MG 24 hr tablet Take 1 tablet (50 mg total) by mouth daily. Take with or immediately following a meal. 30 tablet 11   montelukast (SINGULAIR) 10 MG tablet Take 1 tablet (10 mg total) by mouth at bedtime. 90 tablet 1   Olopatadine-Mometasone (RYALTRIS) 665-25 MCG/ACT SUSP Place 2 sprays into the nose 2 (two) times daily. 29 g 2   rosuvastatin (CRESTOR) 40 MG tablet TAKE 1 TABLET EVERY DAY (NEED MD APPOINTMENT FOR REFILLS) 90 tablet 3   spironolactone (ALDACTONE) 50 MG tablet Take 1.5 tablets (75 mg total) by mouth daily. 135 tablet 0   TRUEplus Lancets 33G MISC USE AS DIRECTED 300 each 3   Current Facility-Administered Medications  Medication Dose Route Frequency Provider Last Rate Last Admin   Benralizumab SOSY 30 mg  30 mg Subcutaneous Q8 Weeks Kozlow, Alvira Philips, MD   30 mg at 09/16/22 1034   Family History  Problem Relation Age of Onset   Dementia Mother    Gallbladder disease Mother    Diabetes Father    Heart failure Father    Hypertension Father    Sleep apnea Father    Kidney disease Father    Dementia Father    Thyroid disease Sister    Hypertension Sister    Dementia Sister    Healthy Sister    Social History   Socioeconomic History   Marital status: Divorced    Spouse name: Not on file   Number of children: Not on file   Years of education: Not on file   Highest education level: Not on file  Occupational History   Not on file  Tobacco Use   Smoking status: Former    Current packs/day: 0.00    Types: Cigarettes    Quit date: 02/04/2006    Years since quitting: 16.6   Smokeless tobacco: Never  Vaping Use   Vaping status: Never Used  Substance and Sexual Activity   Alcohol use: No    Alcohol/week: 0.0 standard drinks of alcohol   Drug use: No   Sexual activity: Not on file  Other Topics Concern   Not on file  Social History  Narrative   Lives with sister in a one story home.  Has one child.  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     Right handed   Caffeine: 24 oz/day of pepsi   Social Determinants of Health   Financial Resource Strain: Low Risk  (07/31/2022)   Overall Financial Resource Strain (CARDIA)    Difficulty of Paying Living Expenses: Not hard at all  Food Insecurity: No Food Insecurity (07/31/2022)  Hunger Vital Sign    Worried About Running Out of Food in the Last Year: Never true    Ran Out of Food in the Last Year: Never true  Transportation Needs: No Transportation Needs (07/31/2022)   PRAPARE - Administrator, Civil Service (Medical): No    Lack of Transportation (Non-Medical): No  Physical Activity: Inactive (07/31/2022)   Exercise Vital Sign    Days of Exercise per Week: 0 days    Minutes of Exercise per Session: 0 min  Stress: Stress Concern Present (07/31/2022)   [employer] of Occupational Health - Occupational Stress Questionnaire    Feeling of Stress : To some extent  Social Connections: Moderately Integrated (07/31/2022)   Social Connection and Isolation Panel [NHANES]    Frequency of Communication with Friends and Family: More than three times a week    Frequency of Social Gatherings with Friends and Family: More than three times a week    Attends Religious Services: More than 4 times per year    Active Member of [employer] or Organizations: Yes    Attends Engineer, structural: More than 4 times per year    Marital Status: Divorced   Review of Systems: ROS negative except for what is noted on the assessment and plan.  Objective:  Physical Exam: Vitals:   09/17/22 1023 09/17/22 1113  BP: (!) 148/69 (!) 151/58  Pulse: 75 65  Temp: 98.2 F (36.8 C)   TempSrc: Oral   SpO2: 97%   Weight: 187 lb 12.8 oz (85.2 kg)   Height: 5\' 6"  (1.676 m)     General appearance: well appearing Eyes: tracking appropriately HENT: inflamed left turbinates  Lungs: CTAB, no crackles, no  wheeze, with normal respiratory effort and no intercostal retractions CV: RRR, S1, S2, no MRGs  Skin: Normal temperature, turgor and texture; no rash Psych: Appropriate affect Neuro: Alert and oriented to person and place, no focal deficit   Assessment & Plan:  Essential hypertension Andrea Lowery's blood pressure in clinic today was 151/58 when rechecked. She is currently on amlodiopine-olmesartan 10-40 mg, spironolactone 50 mg, and metoprolol 50 mg. She took amlodipine-olmesartan and spironolactone this morning. She denies any headaches, dizziness, sleep difficulties or SOB. She endorses chest palpitations 1-2 a week. She is not adherent to taking metoprolol everyday and only takes them when she is having these chest palpitations. She has a left bundle branch block and was last seen by a cardiologist in 2021. On physical exam, there was no murmur, rubs or gallops. 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 Her chronic htn is not controlled.  Her bp goal is 120-130/80.   - Increase dose on spironolactone to 75 mg daily (1.5 tablets) - Continue amlodipine-olmesartan 10-40 mg and metoprolol 50 mg  - refer to cardiologist to establish care  - check CMP   Healthcare maintenance - Referred for DEXA scan.  - Follow up with eye doctor to schedule a diabetes eye appointment.  Hyperlipidemia -Previously missing several doses/week. LDL 183 at prior visit. - She is taking rosuvastatin 40 mg. Check lipid panel today. Goal LDL <70  Seasonal allergic conjunctivitis She complains of itchy and red eyes and feelings of ear fullness at times. This is a chronic issue for her. She has no fever or cough. She is using Visine but it is not helpful. Sometimes she has pain in her sinuses. On physical exam, right nasal turbinates were inflamed. Not likely any  respiratory infections. She follows up with allergy and gets benralizumab injections every 8 weeks.   - Use  Olopatadine/Pataday or Ketotifen/Zatidor eye drops for allergy relief until seen by them.  - Continue Benralizumab Q8 wks   Prolonged grief reaction Andrea Lowery lost her grandson 2 years ago, her son had a open heart surgery recently and her sister coded and was hospitalized yesterday. This has caused her stress and anxiety. She has no thoughts of self-harm. She was on hydroxyzine before but no longer is taking it. She is also on duloxetine but is not adherent.  - take duloxetine 30 mg daily - take hydrozyine 25 mg daily prn   Vitamin D insufficiency She has history of Vitamin D insufficiency, osteoarthritis and chronic hip pain. She denies any recent falls. Her vitamin D levels in May was 19.5. She was on vit D 50000 units weekly and finished her prescription.  - recheck vit D levels - Start 1000 units Vit D  Seasonal and perennial allergic rhinitis Follows with allergy/immunology for asthma and allergic rhinitis/conjunctivitis. Experiencing some worsening of her left-sided sinus symptoms today. She is using Ryaltris nasal spray, Zyrtec, montelukast, and receiving Fasenra injections. Encourage allergy eye drops as needed for eye symptoms. Overdue for allergy f/u per last note.   Plan -Continue medications as above. F/u with allergy.  Type 2 diabetes mellitus with hyperglycemia (HCC) Stable, A1c 7.4%. Not checking BG at home. UTD on foot exam, due for eye exam. No microalbuminuria 02/2022.  Plan -Continue empagliflozin 25 mg daily -Pt to call Dr. Dione Booze for DM eye exam -F/u 3 months  Hypothyroidism Mild elevation in TSH at last visit. At that time, Andrea Lowery was taking levothyroxine with other medications and missing doses 1-2x/week. Plan to recheck today. Thyroid US with stable nodules, no changes and no recommendation for f/u.  Plan -TSH  Thyroid nodule Thyroid US 07/2022 with stable nodules, no recommendation for f/u.  Trigeminal neuralgia of left side of face Resumed  carbamazepine at last visit for trigeminal neuralgia. Also prescribed duloxetine for this and right-sided sciatica but she has not been taking this regularly. She is interested in getting off medications and we discussed taking duloxetine regularly and then trying to come off carbamazepine. Repeat liver profile today, mildly elevated AST at prior visit.  Plan -Continue carbamazepine for now, consider titrating off at f/u visit -Liver profile  Osteoporosis without current pathological fracture Patient will need to call and schedule DEXA, reminded of this today with scheduling information provided.  Moderate recurrent major depression (HCC) PHQ9 of 10 today, consistent with moderate depression. She has not been taking duloxetine as discussed at prior visits. She has established with [facility] and attending counseling appointments. Continues to have significant caretaker stress with her sister, as well as recent illness of her son. Encouraged adherence to medication and will f/u in one month.  Plan -Duloxetine 30 mg daily -Continue [facility]  Patient discussed with Dr. Sol Blazing

## 2022-09-17 NOTE — Assessment & Plan Note (Addendum)
Ms. Brumley's blood pressure in clinic today was 151/58 when rechecked. She is currently on amlodiopine-olmesartan 10-40 mg, spironolactone 50 mg, and metoprolol 50 mg. She took amlodipine-olmesartan and spironolactone this morning. She denies any headaches, dizziness, sleep difficulties or SOB. She endorses chest palpitations 1-2 a week. She is not adherent to taking metoprolol everyday and only takes them when she is having these chest palpitations. She has a left bundle branch block and was last seen by a cardiologist in 2021. On physical exam, there was no murmur, rubs or gallops. 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 Her chronic htn is not controlled.  Her bp goal is 120-130/80.   - Increase dose on spironolactone to 75 mg daily (1.5 tablets) - Continue amlodipine-olmesartan 10-40 mg and metoprolol 50 mg  - refer to cardiologist to establish care  - check CMP

## 2022-09-17 NOTE — Assessment & Plan Note (Addendum)
-   Referred for DEXA scan.  - Follow up with eye doctor to schedule a diabetes eye appointment.

## 2022-09-17 NOTE — Assessment & Plan Note (Signed)
Stable, A1c 7.4%. Not checking BG at home. UTD on foot exam, due for eye exam. No microalbuminuria 02/2022.  Plan -Continue empagliflozin 25 mg daily -Pt to call Dr. Dione Booze for DM eye exam -F/u 3 months

## 2022-09-17 NOTE — Assessment & Plan Note (Addendum)
She complains of itchy and red eyes and feelings of ear fullness at times. This is a chronic issue for her. She has no fever or cough. She is using Visine but it is not helpful. Sometimes she has pain in her sinuses. On physical exam, right nasal turbinates were inflamed. Not likely any respiratory infections. She follows up with allergy and gets benralizumab injections every 8 weeks.   - Use Olopatadine/Pataday or Ketotifen/Zatidor eye drops for allergy relief until seen by them.  - Continue Benralizumab Q8 wks

## 2022-09-17 NOTE — Assessment & Plan Note (Addendum)
-  Previously missing several doses/week. LDL 183 at prior visit. - She is taking rosuvastatin 40 mg. Check lipid panel today. Goal LDL <70

## 2022-09-18 NOTE — Progress Notes (Signed)
AST/ALT with mild elevations. 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 Andrea Lowery reports not recently taking carbamazepine and I have asked her to not start at this time. Will schedule close f/u for further evaluation.

## 2022-09-18 NOTE — Progress Notes (Signed)
Cholesterol and LDL remain elevated. Andrea Lowery reports she has been taking her statin/Zetia every day but also indicates she has gotten "mixed up" on her medications. Plan for close f/u with medication review in-person. Also referred to cardiology if needing additional therapy.

## 2022-09-18 NOTE — Progress Notes (Signed)
TSH wnl .  .

## 2022-09-18 NOTE — Progress Notes (Signed)
Vitamin D continues to be mildly low after 8 weeks of high-dose supplementation. Start vitamin D 1000 international units daily. Reviewed with pt 8/14.

## 2022-09-19 ENCOUNTER — Encounter: Payer: Self-pay | Admitting: Pharmacist

## 2022-09-19 ENCOUNTER — Ambulatory Visit (INDEPENDENT_AMBULATORY_CARE_PROVIDER_SITE_OTHER): Payer: Medicare HMO | Admitting: Licensed Clinical Social Worker

## 2022-09-19 DIAGNOSIS — F330 Major depressive disorder, recurrent, mild: Secondary | ICD-10-CM

## 2022-09-19 NOTE — Progress Notes (Signed)
Adherence check-in. Appears adherent to empagliflozin based on 60 day fill on 09/10/22, amlodipine-olmesartan based on 90 day fill on 08/28/22, and rosuvastatin based on 90 day fill on 08/16/22.

## 2022-09-19 NOTE — BH Specialist Note (Signed)
Patient no-showed today's appointment; appointment was for Telephone visit at 3:30pm  Behavioral Health Clinician [facility] from here on out)  attempted patient  via Telephone number [phone]   [facility] contacted patient from telephone number [phone]. [facility] unable to leave a  VM.   Patient will need to reschedule appointment by calling Internal medicine center [phone].  Christen Butter, MSW, LCSW-A She/Her Behavioral Health Clinician [facility]  Internal Medicine Center Direct [phone]  Fax [phone] Main Office Phone: [phone] [address] [place] [place], [place] [address] Website: [facility] [place]  [place], [place]  [place]

## 2022-09-20 ENCOUNTER — Ambulatory Visit: Payer: Medicare HMO

## 2022-09-30 ENCOUNTER — Ambulatory Visit: Payer: Medicare HMO | Admitting: *Deleted

## 2022-09-30 DIAGNOSIS — T63441D Toxic effect of venom of bees, accidental (unintentional), subsequent encounter: Secondary | ICD-10-CM

## 2022-10-02 ENCOUNTER — Ambulatory Visit (HOSPITAL_COMMUNITY)
Admission: RE | Admit: 2022-10-02 | Discharge: 2022-10-02 | Disposition: A | Discharge status: Home / Self Care | Payer: Medicare HMO | Source: Ambulatory Visit | Attending: Physician Assistant | Admitting: Physician Assistant

## 2022-10-02 ENCOUNTER — Ambulatory Visit (INDEPENDENT_AMBULATORY_CARE_PROVIDER_SITE_OTHER): Payer: Medicare HMO | Admitting: Physician Assistant

## 2022-10-02 VITALS — BP 153/75 | HR 60 | Temp 97.7°F | Wt 186.0 lb

## 2022-10-02 DIAGNOSIS — M5430 Sciatica, unspecified side: Secondary | ICD-10-CM

## 2022-10-02 DIAGNOSIS — R600 Localized edema: Secondary | ICD-10-CM | POA: Diagnosis not present

## 2022-10-02 NOTE — Progress Notes (Signed)
[facility] [place] HISTORY AND PHYSICAL   History of Present Illness:  Patient is a 82 y.o. year old female who presents for evaluation of Venous insufficieny She had failed conservative treatment and was felt to be a good candidate for laser ablation of the left great saphenous vein. On 08/24/2020 she underwent laser ablation of the left great saphenous vein from 2-1/2 cm distal to the saphenofemoral junction to the distal thigh.   She was last seen for follow up on 08/31/20 by Dr. Edilia Bo.    She called and stated she has right hip anterior thigh medial leg to the ankle pain.  She does have a history of L4-5 decompression surgery 07/31/22.  She denies claudication, rest pain or non healing wounds.           Past Medical History:  Diagnosis Date   Arthritis    Asthma    Asthma 01/10/2016   Last Assessment & Plan:  After recent ED eval- currrently utilizing prednisone as prescribed. Begin Levaquin, prednisone and Albuterol MDI to be utilized as needed for acute wheezing FU in 2 day is no improvement or worsening of symptoms. Pt verbalizes understanding for current treatment plan. Follow-up in one week. Plan to increase chronic inhaled steroid dose to assess further with prevention of   Diabetes mellitus without complication (HCC)    Diabetes II   Difficulty sleeping 12/28/2013   Last Assessment & Plan:  Stable with trazodone nightly. Continue current treatment plan. Refill trazodone today. Tolerating medication well without side effects. Return in 3 months for follow up or sooner if any worsening of symptoms.    DOE (dyspnea on exertion) 07/01/2012   DVT (deep venous thrombosis) (HCC)    "Years ago"   Dyslipidemia    Eczema    GERD (gastroesophageal reflux disease)    Headache    Migraine   Hypertension    Hypokalemia 04/30/2016   Hypothyroidism    LBBB (left bundle branch block)    MGUS (monoclonal gammopathy of unknown significance)    MGUS (monoclonal  gammopathy of unknown significance)    Migraine    OSA on CPAP    Sinus infection    had sinus surgery   Urinary frequency 07/25/2021    Past Surgical History:  Procedure Laterality Date   ABDOMINAL HYSTERECTOMY  1969   ARTERY BIOPSY N/A 11/07/2014   Procedure: BIOPSY TEMPORAL ARTERY;  Surgeon: Newman Pies, MD;  Location: [facility] OR;  Service: ENT;  Laterality: N/A;   CARPAL TUNNEL RELEASE Right    CHOLECYSTECTOMY  1976   ENDOVENOUS ABLATION SAPHENOUS VEIN W/ LASER Left 08/24/2020   endovenous laser ablation left greater saphenous vein by Cari Caraway MD   LUMBAR LAMINECTOMY/DECOMPRESSION MICRODISCECTOMY N/A 07/31/2015   Procedure: L4-5 Decompression, Possible Right L4-5 Microdiscectomy;  Surgeon: Eldred Manges, MD;  Location: [facility] OR;  Service: Orthopedics;  Laterality: N/A;   NASAL SINUS SURGERY     "years ago"   NM MYOVIEW LTD  02/13/2010   No ischemia   PLANTAR FASCIA RELEASE     TONSILLECTOMY  1965   US ECHOCARDIOGRAPHY  09/20/2008   borderline LVH,mild TR,AOV mildly sclerotic w/ca+ of the leaflets    ROS:   General:  No weight loss, Fever, chills  HEENT: No recent headaches, no nasal bleeding, no visual changes, no sore throat  Neurologic: No dizziness, blackouts, seizures. No recent symptoms of stroke or mini- stroke. No recent episodes of slurred speech, or temporary blindness.  Cardiac: No recent  episodes of chest pain/pressure, no shortness of breath at rest.  No shortness of breath with exertion.  Denies history of atrial fibrillation or irregular heartbeat  Vascular: No history of rest pain in feet.  No history of claudication.  No history of non-healing ulcer, No history of DVT   Pulmonary: No home oxygen, no productive cough, no hemoptysis,  No asthma or wheezing  Musculoskeletal:  [ ]  Arthritis, [x ] Low back pain,  [ ]  Joint pain  Hematologic:No history of hypercoagulable state.  No history of easy bleeding.  No history of anemia  Gastrointestinal: No hematochezia  or melena,  No gastroesophageal reflux, no trouble swallowing  Urinary: [ ]  chronic Kidney disease, [ ]  on HD - [ ]  MWF or [ ]  TTHS, [ ]  Burning with urination, [ ]  Frequent urination, [ ]  Difficulty urinating;   Skin: No rashes  Psychological: No history of anxiety,  No history of depression  Social History Social History   Tobacco Use   Smoking status: Former    Current packs/day: 0.00    Types: Cigarettes    Quit date: 02/04/2006    Years since quitting: 16.6   Smokeless tobacco: Never  Vaping Use   Vaping status: Never Used  Substance Use Topics   Alcohol use: No    Alcohol/week: 0.0 standard drinks of alcohol   Drug use: No    Family History Family History  Problem Relation Age of Onset   Dementia Mother    Gallbladder disease Mother    Diabetes Father    Heart failure Father    Hypertension Father    Sleep apnea Father    Kidney disease Father    Dementia Father    Thyroid disease Sister    Hypertension Sister    Dementia Sister    Healthy Sister     Allergies  Allergies  Allergen Reactions   Bee Venom Anaphylaxis    Yellow jackets Yellow jackets   Penicillins Hives    Has patient had a PCN reaction causing immediate rash, facial/tongue/throat swelling, SOB or lightheadedness with hypotension: Yes Has patient had a PCN reaction causing severe rash involving mucus membranes or skin necrosis: No Has patient had a PCN reaction that required hospitalization No Has patient had a PCN reaction occurring within the last 10 years: No If all of the above answers are "NO", then may proceed with Cephalosporin use.      Current Outpatient Medications  Medication Sig Dispense Refill   acetaminophen (TYLENOL) 500 MG tablet Take 2 tablets (1,000 mg total) by mouth every 8 (eight) hours as needed for moderate pain. 100 tablet 2   albuterol (VENTOLIN HFA) 108 (90 Base) MCG/ACT inhaler INHALE 2 PUFFS EVERY 4 HOURS AS NEEDED FOR WHEEZING OR SHORTNESS OF BREATH 18 g 1    amLODipine-olmesartan (AZOR) 10-40 MG tablet TAKE 1 TABLET EVERY DAY 90 tablet 3   Benralizumab (FASENRA PEN) 30 MG/ML SOAJ Inject 1 mL (30 mg total) into the skin every 8 (eight) weeks. 1 mL 6   Blood Glucose Monitoring Suppl (TRUE METRIX METER) w/Device KIT Use as instructed 1 kit 1   budesonide-formoterol (SYMBICORT) 160-4.5 MCG/ACT inhaler INHALE 2 PUFFS BY MOUTH INTO THE LUNGS TWICE DAILY RINSE GARGLE AND SPIT AFTER USE 30.6 g 1   carbamazepine (TEGRETOL XR) 200 MG 12 hr tablet Take 1 tablet (200 mg total) by mouth daily. 90 tablet 0   cetirizine (ZYRTEC) 10 MG tablet Take 1 tablet (10 mg total) by mouth daily.  30 tablet 5   diclofenac Sodium (VOLTAREN) 1 % GEL Apply to small joints of the hands and wrist up to 4 times daily 100 g 2   DULoxetine (CYMBALTA) 30 MG capsule Take 1 capsule (30 mg total) by mouth daily. 90 capsule 0   empagliflozin (JARDIANCE) 25 MG TABS tablet Take 1 tablet (25 mg total) by mouth daily before breakfast. 90 tablet 3   EPINEPHrine 0.3 mg/0.3 mL IJ SOAJ injection Use as directed for severe allergic reaction 3 each 1   esomeprazole (NEXIUM) 40 MG capsule Take 1 capsule (40 mg total) by mouth daily. 90 capsule 3   ezetimibe (ZETIA) 10 MG tablet Take 1 tablet (10 mg total) by mouth daily. 30 tablet 11   fluticasone (FLONASE) 50 MCG/ACT nasal spray Place 2 sprays into both nostrils daily. 48 g 1   glucose blood (TRUE METRIX BLOOD GLUCOSE TEST) test strip USE AS DIRECTED 300 strip 3   hydrOXYzine (ATARAX) 25 MG tablet TAKE 1 TABLET BY MOUTH AT BEDTIME FOR ANXIETY 30 tablet 0   ipratropium-albuterol (DUONEB) 0.5-2.5 (3) MG/3ML SOLN Take 3 mLs by nebulization every 6 (six) hours as needed. 360 mL 2   levothyroxine (SYNTHROID) 50 MCG tablet TAKE 1 TABLET EVERY DAY, AT LEAST 30-60 MINUTES BEFORE OTHER FOOD OR MEDICINE. 90 tablet 3   montelukast (SINGULAIR) 10 MG tablet Take 1 tablet (10 mg total) by mouth at bedtime. 90 tablet 1   Olopatadine-Mometasone (RYALTRIS) 665-25  MCG/ACT SUSP Place 2 sprays into the nose 2 (two) times daily. 29 g 2   rosuvastatin (CRESTOR) 40 MG tablet TAKE 1 TABLET EVERY DAY (NEED MD APPOINTMENT FOR REFILLS) 90 tablet 3   spironolactone (ALDACTONE) 50 MG tablet Take 1.5 tablets (75 mg total) by mouth daily. 135 tablet 0   TRUEplus Lancets 33G MISC USE AS DIRECTED 300 each 3   Vitamin D, Cholecalciferol, 25 MCG (1000 UT) CAPS Take 1 capsule by mouth daily. 90 capsule 3   metoprolol succinate (TOPROL XL) 50 MG 24 hr tablet Take 1 tablet (50 mg total) by mouth daily. Take with or immediately following a meal. 30 tablet 11   Current Facility-Administered Medications  Medication Dose Route Frequency Provider Last Rate Last Admin   Benralizumab SOSY 30 mg  30 mg Subcutaneous Q8 Weeks Kozlow, Alvira Philips, MD   30 mg at 09/16/22 1034    Physical Examination  Vitals:   10/02/22 1205  BP: (!) 153/75  Pulse: 60  Temp: 97.7 F (36.5 C)  TempSrc: Temporal  SpO2: 97%  Weight: 186 lb (84.4 kg)    Body mass index is 30.02 kg/m.  General:  Alert and oriented, no acute distress HEENT: Normal Neck: No bruit or JVD Pulmonary: Clear to auscultation bilaterally Cardiac: Regular Rate and Rhythm without murmur Abdomen: Soft, non-tender, non-distended, no mass, no scars Skin: No rash, no skin changes Extremity Pulses:  radial,  femoral, dorsalis pedis, pulses bilaterally and doppler posterior tibials Musculoskeletal: ankle localized edema  Neurologic: Upper and lower extremity motor 5/5 and symmetric  DATA:  Venous Reflux Times  +--------------+---------+------+-----------+------------+--------+  RIGHT        Reflux NoRefluxReflux TimeDiameter cmsComments                          Yes                                   +--------------+---------+------+-----------+------------+--------+  CFV          no                                              +--------------+---------+------+-----------+------------+--------+  FV mid         no                                              +--------------+---------+------+-----------+------------+--------+  Popliteal    no                                              +--------------+---------+------+-----------+------------+--------+  GSV at [facility]    no                            0.76              +--------------+---------+------+-----------+------------+--------+  GSV prox thighno                            0.45              +--------------+---------+------+-----------+------------+--------+  GSV mid thigh no                            0.43              +--------------+---------+------+-----------+------------+--------+  GSV dist thighno                            0.49              +--------------+---------+------+-----------+------------+--------+  GSV at knee   no                            0.36              +--------------+---------+------+-----------+------------+--------+  GSV prox calf no                            0.36              +--------------+---------+------+-----------+------------+--------+  SSV Pop Fossa no                            0.43              +--------------+---------+------+-----------+------------+--------+  SSV prox calf no                            0.36              +--------------+---------+------+-----------+------------+--------+     Summary:  Right:  - No evidence of deep vein thrombosis seen in the right lower extremity,  from the common femoral through the popliteal veins.  - No evidence of superficial venous thrombosis in the right lower  extremity.    - The great saphenous vein is competent.  -  The small saphenous vein is competent.   ASSESSMENT/PLAN:   Sciatic pain right LE radicular I reviewed her x ray from a few years ago and she has loss of L4-5 disc space with spondylosis and foraminal narrowing.  She should be referred back to spine Orthopedics or  neurology.    She has palpable DP pulses and doppler signals B PT's.  No skin changes to indicate chronic venous insufficieny.  She denies symptoms of ischemia.   Right LE reflux study was negative with known history of left laser ablation of the left great saphenous vein from 2-1/2 cm distal to the saphenofemoral junction to the distal thigh.   F/U PRN with [facility]   Mosetta Pigeon PA-C [facility] [place] Office: [phone]

## 2022-10-08 ENCOUNTER — Other Ambulatory Visit: Payer: Self-pay

## 2022-10-08 ENCOUNTER — Encounter: Payer: Self-pay | Admitting: Emergency Medicine

## 2022-10-08 ENCOUNTER — Ambulatory Visit
Admission: EM | Admit: 2022-10-08 | Discharge: 2022-10-08 | Disposition: A | Discharge status: Home / Self Care | Payer: Medicare HMO | Attending: Nurse Practitioner | Admitting: Nurse Practitioner

## 2022-10-08 DIAGNOSIS — Z1152 Encounter for screening for COVID-19: Secondary | ICD-10-CM | POA: Diagnosis not present

## 2022-10-08 DIAGNOSIS — J069 Acute upper respiratory infection, unspecified: Secondary | ICD-10-CM | POA: Insufficient documentation

## 2022-10-08 MED ORDER — BENZONATATE 100 MG PO CAPS: 100.0000 mg | ORAL_CAPSULE | Freq: Three times a day (TID) | ORAL | 0 refills | Status: DC | PRN

## 2022-10-08 NOTE — Discharge Instructions (Signed)
You have a viral upper respiratory infection.  Symptoms should improve over the next week to 10 days.  If you develop chest pain or shortness of breath, go to the emergency room.  We have tested you today for COVID-19.  You will see the results in Mychart and we will contact you with positive results.  Please stay home and isolate until you are fever free without fever reducing medication for 24 hours.  You are good candidate for antiviral therapy if he test positive, we will prescribe molnupiravir if that is the case tomorrow.  Some things that can make you feel better are: - Increased rest - Increasing fluid with water/sugar free electrolytes - Acetaminophen and ibuprofen as needed for fever/pain - Salt water gargling, chloraseptic spray and throat lozenges - OTC guaifenesin (Mucinex) 600 mg twice daily - Saline sinus flushes or a neti pot - Humidifying the air -Tessalon Perles every 8 hours as needed for dry cough

## 2022-10-08 NOTE — ED Provider Notes (Signed)
[name]    CSN: [id number] Arrival date & time: 10/08/22  1144      History   Chief Complaint Chief Complaint  Patient presents with   Cough    HPI Andrea Lowery is a 82 y.o. female.   Patient presents today with 2-day history of congested cough, wheezing, stuffy nose, postnasal drainage and sore throat, headache, left ear irritation, diarrhea, decreased appetite, and fatigue.  Denies fever, body aches or chills, shortness of breath or chest pain, runny nose, abdominal pain, nausea/vomiting, and known sick contacts.  Reports she has been using asthma rescue inhaler more frequently than normal which does help with the wheezing.  Otherwise, not taken anything for symptoms.    Past Medical History:  Diagnosis Date   Arthritis    Asthma    Asthma 01/10/2016   Last Assessment & Plan:  After recent ED eval- currrently utilizing prednisone as prescribed. Begin Levaquin, prednisone and Albuterol MDI to be utilized as needed for acute wheezing FU in 2 day is no improvement or worsening of symptoms. Pt verbalizes understanding for current treatment plan. Follow-up in one week. Plan to increase chronic inhaled steroid dose to assess further with prevention of   Diabetes mellitus without complication (HCC)    Diabetes II   Difficulty sleeping 12/28/2013   Last Assessment & Plan:  Stable with trazodone nightly. Continue current treatment plan. Refill trazodone today. Tolerating medication well without side effects. Return in 3 months for follow up or sooner if any worsening of symptoms.    DOE (dyspnea on exertion) 07/01/2012   DVT (deep venous thrombosis) (HCC)    "Years ago"   Dyslipidemia    Eczema    GERD (gastroesophageal reflux disease)    Headache    Migraine   Hypertension    Hypokalemia 04/30/2016   Hypothyroidism    LBBB (left bundle branch block)    MGUS (monoclonal gammopathy of unknown significance)    MGUS (monoclonal gammopathy of unknown  significance)    Migraine    OSA on CPAP    Sinus infection    had sinus surgery   Urinary frequency 07/25/2021    Patient Active Problem List   Diagnosis Date Noted   Right shoulder tendinitis 06/17/2022   Seasonal allergic conjunctivitis 04/22/2022   Moderate persistent asthma 11/05/2021   Trigeminal neuralgia of left side of face 11/05/2021   Arthritis 11/05/2021   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   Vitamin D insufficiency 10/15/2021   De Quervain's tenosynovitis, left 09/21/2021   Seasonal and perennial allergic rhinitis 08/18/2019   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   Eczema 08/02/2016   Osteoporosis without current pathological fracture 05/30/2015   Chronic right-sided low back pain with right-sided sciatica 03/31/2015   Type 2 diabetes mellitus with hyperglycemia (HCC) 11/07/2014   Diastolic CHF (HCC) 11/07/2014   Sacroiliac dysfunction 11/04/2014   LPRD (laryngopharyngeal reflux disease) 10/05/2014   Hypothyroidism 09/21/2014   Thyroid nodule 07/01/2014   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/18/2014   Healthcare maintenance 01/25/2014   Hyperlipidemia 12/27/2013   Bilateral leg edema 07/20/2013   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/2013   Essential hypertension 07/01/2012    Past Surgical History:  Procedure Laterality Date   ABDOMINAL HYSTERECTOMY  1969   ARTERY BIOPSY N/A 11/07/2014   Procedure: BIOPSY TEMPORAL ARTERY;  Surgeon: Newman Pies, MD;  Location: [facility] OR;  Service: ENT;  Laterality: N/A;   CARPAL TUNNEL RELEASE Right    CHOLECYSTECTOMY  1976   ENDOVENOUS  ABLATION SAPHENOUS VEIN W/ LASER Left 08/24/2020   endovenous laser ablation left greater saphenous vein by Cari Caraway MD   LUMBAR LAMINECTOMY/DECOMPRESSION MICRODISCECTOMY N/A 07/31/2015   Procedure: L4-5 Decompression, Possible Right L4-5 Microdiscectomy;  Surgeon: Eldred Manges, MD;  Location: [facility] OR;  Service: Orthopedics;  Laterality: N/A;   NASAL SINUS SURGERY      "years ago"   NM MYOVIEW LTD  02/13/2010   No ischemia   PLANTAR FASCIA RELEASE     TONSILLECTOMY  1965   US ECHOCARDIOGRAPHY  09/20/2008   borderline LVH,mild TR,AOV mildly sclerotic w/ca+ of the leaflets    OB History   No obstetric history on file.      Home Medications    Prior to Admission medications   Medication Sig Start Date End Date Taking? Authorizing Provider  benzonatate (TESSALON) 100 MG capsule Take 1 capsule (100 mg total) by mouth 3 (three) times daily as needed for cough. Do not take with alcohol or while driving or operating heavy machinery.  May cause drowsiness. 10/08/22  Yes Valentino Nose, NP  acetaminophen (TYLENOL) 500 MG tablet Take 2 tablets (1,000 mg total) by mouth every 8 (eight) hours as needed for moderate pain. 11/05/21 11/05/22  Dickie La, MD  albuterol (VENTOLIN HFA) 108 (90 Base) MCG/ACT inhaler INHALE 2 PUFFS EVERY 4 HOURS AS NEEDED FOR WHEEZING OR SHORTNESS OF BREATH 08/05/22   Hetty Blend, FNP  amLODipine-olmesartan (AZOR) 10-40 MG tablet TAKE 1 TABLET EVERY DAY 06/14/22   Dickie La, MD  Benralizumab (FASENRA PEN) 30 MG/ML SOAJ Inject 1 mL (30 mg total) into the skin every 8 (eight) weeks. 07/04/22   Hetty Blend, FNP  Blood Glucose Monitoring Suppl (TRUE METRIX METER) w/Device KIT Use as instructed 05/22/21   Dellis Filbert, MD  budesonide-formoterol (SYMBICORT) 160-4.5 MCG/ACT inhaler INHALE 2 PUFFS BY MOUTH INTO THE LUNGS TWICE DAILY RINSE GARGLE AND SPIT AFTER USE 04/22/22   Ambs, Norvel Richards, FNP  carbamazepine (TEGRETOL XR) 200 MG 12 hr tablet Take 1 tablet (200 mg total) by mouth daily. 06/18/22   Dickie La, MD  cetirizine (ZYRTEC) 10 MG tablet Take 1 tablet (10 mg total) by mouth daily. 04/02/22   Hetty Blend, FNP  diclofenac Sodium (VOLTAREN) 1 % GEL Apply to small joints of the hands and wrist up to 4 times daily 11/05/21   Dickie La, MD  DULoxetine (CYMBALTA) 30 MG capsule Take 1 capsule (30 mg total) by mouth daily. 08/23/22   Rana Snare,  DO  empagliflozin (JARDIANCE) 25 MG TABS tablet Take 1 tablet (25 mg total) by mouth daily before breakfast. 09/12/22   Dickie La, MD  EPINEPHrine 0.3 mg/0.3 mL IJ SOAJ injection Use as directed for severe allergic reaction 02/07/22   Ambs, Norvel Richards, FNP  esomeprazole (NEXIUM) 40 MG capsule Take 1 capsule (40 mg total) by mouth daily. 01/16/22   Dickie La, MD  ezetimibe (ZETIA) 10 MG tablet Take 1 tablet (10 mg total) by mouth daily. 01/16/22   Dickie La, MD  fluticasone (FLONASE) 50 MCG/ACT nasal spray Place 2 sprays into both nostrils daily. 02/07/22   Hetty Blend, FNP  glucose blood (TRUE METRIX BLOOD GLUCOSE TEST) test strip USE AS DIRECTED 03/19/22   Dickie La, MD  hydrOXYzine (ATARAX) 25 MG tablet TAKE 1 TABLET BY MOUTH AT BEDTIME FOR ANXIETY 09/17/22   Dickie La, MD  ipratropium-albuterol (DUONEB) 0.5-2.5 (3) MG/3ML SOLN Take 3 mLs by nebulization every 6 (six) hours as needed. 05/17/21  Dellis Filbert, MD  levothyroxine (SYNTHROID) 50 MCG tablet TAKE 1 TABLET EVERY DAY, AT LEAST 30-60 MINUTES BEFORE OTHER FOOD OR MEDICINE. 08/12/22   Dickie La, MD  metoprolol succinate (TOPROL XL) 50 MG 24 hr tablet Take 1 tablet (50 mg total) by mouth daily. Take with or immediately following a meal. 07/24/21 07/24/22  Steffanie Rainwater, MD  montelukast (SINGULAIR) 10 MG tablet Take 1 tablet (10 mg total) by mouth at bedtime. 04/22/22   Ambs, Norvel Richards, FNP  Olopatadine-Mometasone (RYALTRIS) [phone] MCG/ACT SUSP Place 2 sprays into the nose 2 (two) times daily. 05/22/22   Hetty Blend, FNP  rosuvastatin (CRESTOR) 40 MG tablet TAKE 1 TABLET EVERY DAY (NEED MD APPOINTMENT FOR REFILLS) 01/04/22   Gust Rung, DO  spironolactone (ALDACTONE) 50 MG tablet Take 1.5 tablets (75 mg total) by mouth daily. 09/17/22   Dickie La, MD  TRUEplus Lancets 33G MISC USE AS DIRECTED 04/29/22   Dickie La, MD  Vitamin D, Cholecalciferol, 25 MCG (1000 UT) CAPS Take 1 capsule by mouth daily. 09/17/22   Dickie La, MD    Family  History Family History  Problem Relation Age of Onset   Dementia Mother    Gallbladder disease Mother    Diabetes Father    Heart failure Father    Hypertension Father    Sleep apnea Father    Kidney disease Father    Dementia Father    Thyroid disease Sister    Hypertension Sister    Dementia Sister    Healthy Sister     Social History Social History   Tobacco Use   Smoking status: Former    Current packs/day: 0.00    Types: Cigarettes    Quit date: 02/04/2006    Years since quitting: 16.6   Smokeless tobacco: Never  Vaping Use   Vaping status: Never Used  Substance Use Topics   Alcohol use: No    Alcohol/week: 0.0 standard drinks of alcohol   Drug use: No     Allergies   Bee venom and Penicillins   Review of Systems Review of Systems Per HPI  Physical Exam Triage Vital Signs ED Triage Vitals [10/08/22 1230]  Encounter Vitals Group     BP (!) 153/81     Systolic BP Percentile      Diastolic BP Percentile      Pulse Rate 84     Resp 18     Temp 98.4 F (36.9 C)     Temp Source Oral     SpO2 96 %     Weight      Height      Head Circumference      Peak Flow      Pain Score 0     Pain Loc      Pain Education      Exclude from Growth Chart    No data found.  Updated Vital Signs BP (!) 153/81 (BP Location: Left Arm)   Pulse 84   Temp 98.4 F (36.9 C) (Oral)   Resp 18   SpO2 96%   Visual Acuity Right Eye Distance:   Left Eye Distance:   Bilateral Distance:    Right Eye Near:   Left Eye Near:    Bilateral Near:     Physical Exam Vitals and nursing note reviewed.  Constitutional:      General: She is not in acute distress.    Appearance: Normal appearance. She is not ill-appearing or toxic-appearing.  HENT:     Head: Normocephalic and atraumatic.     Right Ear: Tympanic membrane, ear canal and external ear normal.     Left Ear: Tympanic membrane, ear canal and external ear normal.     Nose: No congestion or rhinorrhea.      Mouth/Throat:     Mouth: Mucous membranes are moist.     Pharynx: Oropharynx is clear. No oropharyngeal exudate or posterior oropharyngeal erythema.  Eyes:     General: No scleral icterus.    Extraocular Movements: Extraocular movements intact.  Cardiovascular:     Rate and Rhythm: Normal rate and regular rhythm.     Heart sounds: Normal heart sounds. No murmur heard. Pulmonary:     Effort: Pulmonary effort is normal. No respiratory distress.     Breath sounds: Normal breath sounds. No wheezing, rhonchi or rales.  Abdominal:     General: Abdomen is flat. Bowel sounds are normal. There is no distension.     Palpations: Abdomen is soft.  Musculoskeletal:     Cervical back: Normal range of motion and neck supple.  Lymphadenopathy:     Cervical: No cervical adenopathy.  Skin:    General: Skin is warm and dry.     Coloration: Skin is not jaundiced or pale.     Findings: No erythema or rash.  Neurological:     Mental Status: She is alert and oriented to person, place, and time.  Psychiatric:        Behavior: Behavior is cooperative.      UC Treatments / Results  Labs (all labs ordered are listed, but only abnormal results are displayed) Labs Reviewed  SARS CORONAVIRUS 2 (TAT 6-24 HRS)    EKG   Radiology No results found.  Procedures Procedures (including critical care time)  Medications Ordered in UC Medications - No data to display  Initial Impression / Assessment and Plan / UC Course  I have reviewed the triage vital signs and the nursing notes.  Pertinent labs & imaging results that were available during my care of the patient were reviewed by me and considered in my medical decision making (see chart for details).  Patient is well-appearing, normotensive, afebrile, not tachycardic, not tachypneic, oxygenating well on room air.    1. Viral URI with cough 2. Encounter for screening for COVID-19 Suspect viral etiology Vitals and exam today are  reassuring COVID-19 testing obtained Patient is a candidate for molnupiravir if positive Supportive care discussed ER and return precautions also discussed   The patient was given the opportunity to ask questions.  All questions answered to their satisfaction.  The patient is in agreement to this plan.  Final Clinical Impressions(s) / UC Diagnoses   Final diagnoses:  Viral URI with cough  Encounter for screening for COVID-19     Discharge Instructions      You have a viral upper respiratory infection.  Symptoms should improve over the next week to 10 days.  If you develop chest pain or shortness of breath, go to the emergency room.  We have tested you today for COVID-19.  You will see the results in Mychart and we will contact you with positive results.  Please stay home and isolate until you are fever free without fever reducing medication for 24 hours.  You are good candidate for antiviral therapy if he test positive, we will prescribe molnupiravir if that is the case tomorrow.  Some things that can make you feel better are: - Increased rest -  Increasing fluid with water/sugar free electrolytes - Acetaminophen and ibuprofen as needed for fever/pain - Salt water gargling, chloraseptic spray and throat lozenges - OTC guaifenesin (Mucinex) 600 mg twice daily - Saline sinus flushes or a neti pot - Humidifying the air -Tessalon Perles every 8 hours as needed for dry cough      ED Prescriptions     Medication Sig Dispense Auth. Provider   benzonatate (TESSALON) 100 MG capsule Take 1 capsule (100 mg total) by mouth 3 (three) times daily as needed for cough. Do not take with alcohol or while driving or operating heavy machinery.  May cause drowsiness. 21 capsule Valentino Nose, NP      PDMP not reviewed this encounter.   Valentino Nose, NP 10/08/22 1311

## 2022-10-08 NOTE — ED Triage Notes (Signed)
Pt sts cough and congestion starting yesterday that became worse this am

## 2022-10-09 LAB — SARS CORONAVIRUS 2 (TAT 6-24 HRS): SARS Coronavirus 2: NEGATIVE

## 2022-10-15 ENCOUNTER — Encounter: Payer: Medicare HMO | Admitting: Internal Medicine

## 2022-10-22 DIAGNOSIS — I5032 Chronic diastolic (congestive) heart failure: Secondary | ICD-10-CM | POA: Diagnosis not present

## 2022-10-22 DIAGNOSIS — E1169 Type 2 diabetes mellitus with other specified complication: Secondary | ICD-10-CM | POA: Diagnosis not present

## 2022-10-22 DIAGNOSIS — R011 Cardiac murmur, unspecified: Secondary | ICD-10-CM | POA: Diagnosis not present

## 2022-10-22 DIAGNOSIS — E785 Hyperlipidemia, unspecified: Secondary | ICD-10-CM | POA: Diagnosis not present

## 2022-10-22 DIAGNOSIS — I447 Left bundle-branch block, unspecified: Secondary | ICD-10-CM | POA: Diagnosis not present

## 2022-10-22 DIAGNOSIS — I10 Essential (primary) hypertension: Secondary | ICD-10-CM | POA: Diagnosis not present

## 2022-10-22 DIAGNOSIS — Z79899 Other long term (current) drug therapy: Secondary | ICD-10-CM | POA: Diagnosis not present

## 2022-10-22 DIAGNOSIS — I110 Hypertensive heart disease with heart failure: Secondary | ICD-10-CM | POA: Diagnosis not present

## 2022-10-22 DIAGNOSIS — E042 Nontoxic multinodular goiter: Secondary | ICD-10-CM | POA: Diagnosis not present

## 2022-10-30 DIAGNOSIS — Z1231 Encounter for screening mammogram for malignant neoplasm of breast: Secondary | ICD-10-CM | POA: Diagnosis not present

## 2022-11-01 ENCOUNTER — Other Ambulatory Visit: Payer: Self-pay

## 2022-11-01 MED ORDER — ROSUVASTATIN CALCIUM 40 MG PO TABS: ORAL_TABLET | ORAL | 3 refills | Status: AC

## 2022-11-04 DIAGNOSIS — I5032 Chronic diastolic (congestive) heart failure: Secondary | ICD-10-CM | POA: Diagnosis not present

## 2022-11-04 DIAGNOSIS — Z79899 Other long term (current) drug therapy: Secondary | ICD-10-CM | POA: Diagnosis not present

## 2022-11-04 DIAGNOSIS — F329 Major depressive disorder, single episode, unspecified: Secondary | ICD-10-CM | POA: Diagnosis not present

## 2022-11-04 DIAGNOSIS — E1169 Type 2 diabetes mellitus with other specified complication: Secondary | ICD-10-CM | POA: Diagnosis not present

## 2022-11-04 DIAGNOSIS — E039 Hypothyroidism, unspecified: Secondary | ICD-10-CM | POA: Diagnosis not present

## 2022-11-04 DIAGNOSIS — Z0001 Encounter for general adult medical examination with abnormal findings: Secondary | ICD-10-CM | POA: Diagnosis not present

## 2022-11-04 DIAGNOSIS — I110 Hypertensive heart disease with heart failure: Secondary | ICD-10-CM | POA: Diagnosis not present

## 2022-11-04 DIAGNOSIS — J302 Other seasonal allergic rhinitis: Secondary | ICD-10-CM | POA: Diagnosis not present

## 2022-11-04 DIAGNOSIS — E669 Obesity, unspecified: Secondary | ICD-10-CM | POA: Diagnosis not present

## 2022-11-09 ENCOUNTER — Other Ambulatory Visit: Payer: Self-pay | Admitting: Internal Medicine

## 2022-11-11 ENCOUNTER — Ambulatory Visit: Payer: Medicare HMO

## 2022-11-11 DIAGNOSIS — J4550 Severe persistent asthma, uncomplicated: Secondary | ICD-10-CM

## 2022-11-25 ENCOUNTER — Ambulatory Visit (INDEPENDENT_AMBULATORY_CARE_PROVIDER_SITE_OTHER): Payer: Medicare HMO

## 2022-11-25 DIAGNOSIS — T63441D Toxic effect of venom of bees, accidental (unintentional), subsequent encounter: Secondary | ICD-10-CM

## 2022-11-26 ENCOUNTER — Ambulatory Visit (INDEPENDENT_AMBULATORY_CARE_PROVIDER_SITE_OTHER): Payer: Medicare HMO | Admitting: Allergy and Immunology

## 2022-11-26 ENCOUNTER — Other Ambulatory Visit: Payer: Self-pay

## 2022-11-26 VITALS — BP 142/90 | HR 71 | Temp 98.3°F | Resp 16 | Ht 65.0 in | Wt 188.6 lb

## 2022-11-26 DIAGNOSIS — J301 Allergic rhinitis due to pollen: Secondary | ICD-10-CM | POA: Diagnosis not present

## 2022-11-26 DIAGNOSIS — K219 Gastro-esophageal reflux disease without esophagitis: Secondary | ICD-10-CM | POA: Diagnosis not present

## 2022-11-26 DIAGNOSIS — H1010 Acute atopic conjunctivitis, unspecified eye: Secondary | ICD-10-CM

## 2022-11-26 DIAGNOSIS — J4540 Moderate persistent asthma, uncomplicated: Secondary | ICD-10-CM | POA: Diagnosis not present

## 2022-11-26 DIAGNOSIS — J988 Other specified respiratory disorders: Secondary | ICD-10-CM

## 2022-11-26 MED ORDER — EPINEPHRINE 0.3 MG/0.3ML IJ SOAJ: 0.3000 mg | INTRAMUSCULAR | 1 refills | Status: DC | PRN

## 2022-11-26 MED ORDER — FLUTICASONE PROPIONATE 50 MCG/ACT NA SUSP: 2.0000 | Freq: Every day | NASAL | 1 refills | Status: DC

## 2022-11-26 MED ORDER — PREDNISONE 10 MG PO TABS: 10.0000 mg | ORAL_TABLET | Freq: Every morning | ORAL | 0 refills | Status: AC

## 2022-11-26 MED ORDER — IPRATROPIUM-ALBUTEROL 0.5-2.5 (3) MG/3ML IN SOLN: 3.0000 mL | Freq: Four times a day (QID) | RESPIRATORY_TRACT | 1 refills | Status: DC | PRN

## 2022-11-26 MED ORDER — ESOMEPRAZOLE MAGNESIUM 40 MG PO CPDR: 40.0000 mg | DELAYED_RELEASE_CAPSULE | Freq: Two times a day (BID) | ORAL | 1 refills | Status: DC

## 2022-11-26 MED ORDER — CETIRIZINE HCL 10 MG PO TABS: 10.0000 mg | ORAL_TABLET | Freq: Every day | ORAL | 1 refills | Status: DC | PRN

## 2022-11-26 MED ORDER — MONTELUKAST SODIUM 10 MG PO TABS: 10.0000 mg | ORAL_TABLET | Freq: Every evening | ORAL | 1 refills | Status: DC

## 2022-11-26 MED ORDER — RYALTRIS 665-25 MCG/ACT NA SUSP: 2.0000 | Freq: Two times a day (BID) | NASAL | 1 refills | Status: DC

## 2022-11-26 MED ORDER — AZITHROMYCIN 500 MG PO TABS: 500.0000 mg | ORAL_TABLET | Freq: Every morning | ORAL | 0 refills | Status: DC

## 2022-11-26 MED ORDER — BUDESONIDE-FORMOTEROL FUMARATE 160-4.5 MCG/ACT IN AERO: INHALATION_SPRAY | RESPIRATORY_TRACT | 1 refills | Status: DC

## 2022-11-26 NOTE — Progress Notes (Unsigned)
[place] [facility] [place] - [place] - [place] [facility] [place]   Follow-up Note  Referring Provider: Dickie La, MD Primary Provider: Dickie La, MD Date of Office Visit: 11/26/2022  Subjective:   Andrea Lowery (DOB: 1940/02/20) is a 82 y.o. female who [facility] in re-evaluation of the following:  HPI: Andrea Lowery returns to this clinic in evaluation of asthma, allergic rhinitis, reflux, hymenoptera venom hypersensitivity state,  history of MGUS. I last saw her in this clinic 13 February 2021 and her last visit with our nurse practitioner was 22 April 2022.  The new event for Andrea Lowery is that over the course of the past 48 hours she has been having cough and she is making ugly sputum production and she has had some diffuse aches across her chest from coughing and she has been somewhat achy and fatigued without any fever or any other associated systemic or constitutional symptoms.  Prior to this point in time she was actually doing relatively well and did not need to use the short acting bronchodilator while she was continuing on Fasenra injections.  She does not use the Symbicort that has been given to her in the past and she rarely had to use any short acting bronchodilator.  And she thought her nose was doing well while intermittently using some combination nasal steroid and nasal antihistamine.  She has not been having any problems with her reflux while using a proton pump inhibitor.  She continues on immunotherapy for hymenoptera venom hypersensitivity state with administration of this form of therapy every 4 weeks without adverse effect.  Her MGUS is still being followed by oncology and appears to be a stable issue.  Allergies as of 11/26/2022       Reactions   Bee Venom Anaphylaxis   Yellow jackets Yellow jackets   Penicillins Hives   Has patient had a PCN reaction causing immediate rash, facial/tongue/throat swelling, SOB or lightheadedness  with hypotension: Yes Has patient had a PCN reaction causing severe rash involving mucus membranes or skin necrosis: No Has patient had a PCN reaction that required hospitalization No Has patient had a PCN reaction occurring within the last 10 years: No If all of the above answers are "NO", then may proceed with Cephalosporin use.        Medication List    albuterol 108 (90 Base) MCG/ACT inhaler Commonly known as: VENTOLIN HFA INHALE 2 PUFFS EVERY 4 HOURS AS NEEDED FOR WHEEZING OR SHORTNESS OF BREATH   amLODipine-olmesartan 10-40 MG tablet Commonly known as: AZOR TAKE 1 TABLET EVERY DAY   benzonatate 100 MG capsule Commonly known as: TESSALON Take 1 capsule (100 mg total) by mouth 3 (three) times daily as needed for cough. Do not take with alcohol or while driving or operating heavy machinery.  May cause drowsiness.   budesonide-formoterol 160-4.5 MCG/ACT inhaler Commonly known as: Symbicort INHALE 2 PUFFS BY MOUTH INTO THE LUNGS TWICE DAILY RINSE GARGLE AND SPIT AFTER USE   carbamazepine 200 MG 12 hr tablet Commonly known as: TEGRETOL XR Take 1 tablet (200 mg total) by mouth daily.   cetirizine 10 MG tablet Commonly known as: ZYRTEC Take 1 tablet (10 mg total) by mouth daily.   diclofenac Sodium 1 % Gel Commonly known as: Voltaren Apply to small joints of the hands and wrist up to 4 times daily   DULoxetine 30 MG capsule Commonly known as: Cymbalta Take 1 capsule (30 mg total) by mouth daily.  empagliflozin 25 MG Tabs tablet Commonly known as: Jardiance Take 1 tablet (25 mg total) by mouth daily before breakfast.   EPINEPHrine 0.3 mg/0.3 mL Soaj injection Commonly known as: EPI-PEN Use as directed for severe allergic reaction   esomeprazole 40 MG capsule Commonly known as: NEXIUM TAKE 1 CAPSULE EVERY DAY   ezetimibe 10 MG tablet Commonly known as: ZETIA TAKE 1 TABLET EVERY DAY   Fasenra Pen 30 MG/ML prefilled autoinjector Generic drug:  benralizumab Inject 1 mL (30 mg total) into the skin every 8 (eight) weeks.   fluticasone 50 MCG/ACT nasal spray Commonly known as: FLONASE Place 2 sprays into both nostrils daily.   Fluzone High-Dose 0.5 ML injection Generic drug: Influenza vac split quadrivalent PF Inject 0.5 mLs into the muscle once.   hydrOXYzine 25 MG tablet Commonly known as: ATARAX TAKE 1 TABLET BY MOUTH AT BEDTIME FOR ANXIETY   ipratropium-albuterol 0.5-2.5 (3) MG/3ML Soln Commonly known as: DUONEB Take 3 mLs by nebulization every 6 (six) hours as needed.   levothyroxine 50 MCG tablet Commonly known as: SYNTHROID TAKE 1 TABLET EVERY DAY, AT LEAST 30-60 MINUTES BEFORE OTHER FOOD OR MEDICINE.   metoprolol succinate 50 MG 24 hr tablet Commonly known as: Toprol XL Take 1 tablet (50 mg total) by mouth daily. Take with or immediately following a meal.   montelukast 10 MG tablet Commonly known as: SINGULAIR Take 1 tablet (10 mg total) by mouth at bedtime.   rosuvastatin 40 MG tablet Commonly known as: CRESTOR TAKE 1 TABLET EVERY DAY (NEED MD APPOINTMENT FOR REFILLS)   Ryaltris 161-09 MCG/ACT Susp Generic drug: Olopatadine-Mometasone Place 2 sprays into the nose 2 (two) times daily.   spironolactone 50 MG tablet Commonly known as: ALDACTONE Take 1.5 tablets (75 mg total) by mouth daily.   True Metrix Blood Glucose Test test strip Generic drug: glucose blood USE AS DIRECTED   True Metrix Meter w/Device Kit Use as instructed   TRUEplus Lancets 33G Misc USE AS DIRECTED   Vitamin D (Cholecalciferol) 25 MCG (1000 UT) Caps Take 1 capsule by mouth daily.    Past Medical History:  Diagnosis Date   Arthritis    Asthma    Asthma 01/10/2016   Last Assessment & Plan:  After recent ED eval- currrently utilizing prednisone as prescribed. Begin Levaquin, prednisone and Albuterol MDI to be utilized as needed for acute wheezing FU in 2 day is no improvement or worsening of symptoms. Pt verbalizes  understanding for current treatment plan. Follow-up in one week. Plan to increase chronic inhaled steroid dose to assess further with prevention of   Diabetes mellitus without complication (HCC)    Diabetes II   Difficulty sleeping 12/28/2013   Last Assessment & Plan:  Stable with trazodone nightly. Continue current treatment plan. Refill trazodone today. Tolerating medication well without side effects. Return in 3 months for follow up or sooner if any worsening of symptoms.    DOE (dyspnea on exertion) 07/01/2012   DVT (deep venous thrombosis) (HCC)    "Years ago"   Dyslipidemia    Eczema    GERD (gastroesophageal reflux disease)    Headache    Migraine   Hypertension    Hypokalemia 04/30/2016   Hypothyroidism    LBBB (left bundle branch block)    MGUS (monoclonal gammopathy of unknown significance)    MGUS (monoclonal gammopathy of unknown significance)    Migraine    OSA on CPAP    Sinus infection    had sinus surgery  Urinary frequency 07/25/2021    Past Surgical History:  Procedure Laterality Date   ABDOMINAL HYSTERECTOMY  1969   ARTERY BIOPSY N/A 11/07/2014   Procedure: BIOPSY TEMPORAL ARTERY;  Surgeon: Newman Pies, MD;  Location: [facility] OR;  Service: ENT;  Laterality: N/A;   CARPAL TUNNEL RELEASE Right    CHOLECYSTECTOMY  1976   ENDOVENOUS ABLATION SAPHENOUS VEIN W/ LASER Left 08/24/2020   endovenous laser ablation left greater saphenous vein by Cari Caraway MD   LUMBAR LAMINECTOMY/DECOMPRESSION MICRODISCECTOMY N/A 07/31/2015   Procedure: L4-5 Decompression, Possible Right L4-5 Microdiscectomy;  Surgeon: Eldred Manges, MD;  Location: [facility] OR;  Service: Orthopedics;  Laterality: N/A;   NASAL SINUS SURGERY     "years ago"   NM MYOVIEW LTD  02/13/2010   No ischemia   PLANTAR FASCIA RELEASE     TONSILLECTOMY  1965   US ECHOCARDIOGRAPHY  09/20/2008   borderline LVH,mild TR,AOV mildly sclerotic w/ca+ of the leaflets    Review of systems negative except as noted in HPI / PMHx or  noted below:  Review of Systems  Constitutional: Negative.   HENT: Negative.    Eyes: Negative.   Respiratory: Negative.    Cardiovascular: Negative.   Gastrointestinal: Negative.   Genitourinary: Negative.   Musculoskeletal: Negative.   Skin: Negative.   Neurological: Negative.   Endo/Heme/Allergies: Negative.   Psychiatric/Behavioral: Negative.       Objective:   Vitals:   11/26/22 1421 11/26/22 1423  BP: (!) 182/90 (!) 142/90  Pulse:    Resp:    Temp:    SpO2:     Height: 5\' 5"  (165.1 cm)  Weight: 188 lb 9.6 oz (85.5 kg)   Physical Exam Constitutional:      Appearance: She is not diaphoretic.  HENT:     Head: Normocephalic.     Right Ear: Tympanic membrane, ear canal and external ear normal.     Left Ear: Tympanic membrane, ear canal and external ear normal.     Nose: Nose normal. No mucosal edema or rhinorrhea.     Mouth/Throat:     Pharynx: Uvula midline. No oropharyngeal exudate.  Eyes:     Conjunctiva/sclera: Conjunctivae normal.  Neck:     Thyroid: No thyromegaly.     Trachea: Trachea normal. No tracheal tenderness or tracheal deviation.  Cardiovascular:     Rate and Rhythm: Normal rate and regular rhythm.     Heart sounds: Normal heart sounds, S1 normal and S2 normal. No murmur heard. Pulmonary:     Effort: No respiratory distress.     Breath sounds: Normal breath sounds. No stridor. No wheezing or rales.  Lymphadenopathy:     Head:     Right side of head: No tonsillar adenopathy.     Left side of head: No tonsillar adenopathy.     Cervical: No cervical adenopathy.  Skin:    Findings: No erythema or rash.     Nails: There is no clubbing.  Neurological:     Mental Status: She is alert.     Diagnostics: Spirometry was performed and demonstrated an FEV1 of 1.21 at 71 % of predicted.  Results of blood tests obtained 22 July 2022 identifies IgG 1570 Mg/DL, IgM 36 Mg/DL, IgA 657 Mg/DL, kappa/lambda light chain ratio 4.84  Assessment and Plan:    1. Not well controlled moderate persistent asthma   2. Seasonal allergic rhinitis due to pollen   3. Seasonal allergic conjunctivitis   4. LPRD (laryngopharyngeal reflux disease)  5. Respiratory tract infection    1. Continue Fluticasone 1-2 sprays each nostril 1-2 times per day  2. Continue montelukast 10mg  - 1 tablet 1 time per day  3. Continue esomeprazole 40 mg - 1 tablet 1 time per day  4. Continue immunotherapy for venoms and aeroallergens  5. Continue benralizumab injections every 8 weeks  6. If Needed:     A. nasal saline multiple times per day   B. OTC mucinex DM twice a day   C. Cetirizine 10mg  one time per day  D. DUONEB every 4-6 hours  E. EpiPen  7. For this recent event:   A. Restart Symbicort 160 - 2 inhalations every 6 hours if needed (replaces albuterol)  B. Increase esomeprazole 40 mg - 2 times per day  C. Prednisone 10 mg - 1 tablet 1 time per day for 3 days only  D. Azithromycin 500 mg - 1 tablet 1 time per day for 3 days only  8. Obtain fall flu vaccine  9. Return to clinic in 6 months or earlier if problem  Andrea Lowery has cough and she does not feel good in general and she is making ugly sputum production and we will treat her with a broad-spectrum antibiotic to cover mycoplasma and I have given her a very short course of systemic steroids to deal with the inflammatory component of this issue while she restarts her Symbicort utilized as an anti-inflammatory rescue medicine and uses her esomeprazole to twice a day given her history of reflux induced respiratory disease.  Assuming she does well then she can continue to address inflammation of her airway and reflux and hymenoptera venom hypersensitivity with the therapy noted above and we will see her back in this clinic in 6 months or earlier if there is a problem.  Laurette Schimke, MD Allergy / Immunology [facility]

## 2022-11-26 NOTE — Patient Instructions (Addendum)
  1. Continue Fluticasone 1-2 sprays each nostril 1-2 times per day  2. Continue montelukast 10mg  - 1 tablet 1 time per day  3. Continue esomeprazole 40 mg - 1 tablet 1 time per day  4. Continue immunotherapy for venoms and aeroallergens  5. Continue benralizumab injections every 8 weeks  6. If Needed:     A. nasal saline multiple times per day   B. OTC mucinex DM twice a day   C. Cetirizine 10mg  one time per day  D. DUONEB every 4-6 hours  E. EpiPen  7. For this recent event:   A. Restart Symbicort 160 - 2 inhalations every 6 hours if needed (replaces albuterol)  B. Increase esomeprazole 40 mg - 2 times per day  C. Prednisone 10 mg - 1 tablet 1 time per day for 3 days only  D. Azithromycin 500 mg - 1 tablet 1 time per day for 3 days only  8. Obtain fall flu vaccine  9. Return to clinic in 6 months or earlier if problem

## 2022-11-27 ENCOUNTER — Encounter: Payer: Self-pay | Admitting: Allergy and Immunology

## 2022-12-16 ENCOUNTER — Encounter: Payer: Medicare HMO | Admitting: Student

## 2023-01-06 ENCOUNTER — Ambulatory Visit: Payer: Medicare HMO

## 2023-01-10 ENCOUNTER — Ambulatory Visit: Payer: Medicare HMO

## 2023-01-13 ENCOUNTER — Ambulatory Visit: Payer: Medicare HMO | Admitting: *Deleted

## 2023-01-13 ENCOUNTER — Other Ambulatory Visit: Payer: Self-pay | Admitting: Internal Medicine

## 2023-01-13 DIAGNOSIS — I10 Essential (primary) hypertension: Secondary | ICD-10-CM

## 2023-01-13 DIAGNOSIS — J4550 Severe persistent asthma, uncomplicated: Secondary | ICD-10-CM | POA: Diagnosis not present

## 2023-01-13 DIAGNOSIS — E1169 Type 2 diabetes mellitus with other specified complication: Secondary | ICD-10-CM

## 2023-01-13 MED ORDER — TRUEPLUS LANCETS 33G MISC
3 refills | Status: DC
Start: 2023-01-13 — End: 2023-04-17

## 2023-01-13 MED ORDER — SPIRONOLACTONE 50 MG PO TABS
75.0000 mg | ORAL_TABLET | Freq: Every day | ORAL | 0 refills | Status: DC
Start: 2023-01-13 — End: 2023-10-01

## 2023-01-13 NOTE — Telephone Encounter (Signed)
Automatic refill requests from mail order pharmacy. I have sent refills to patient requested pharmacy, [facility]. She is overdue for one month f/u. Please schedule next available.

## 2023-01-20 ENCOUNTER — Ambulatory Visit
Admission: EM | Admit: 2023-01-20 | Discharge: 2023-01-20 | Disposition: A | Discharge status: Home / Self Care | Payer: Medicare HMO

## 2023-01-20 ENCOUNTER — Ambulatory Visit: Payer: Medicare HMO

## 2023-01-20 ENCOUNTER — Telehealth: Payer: Self-pay | Admitting: Emergency Medicine

## 2023-01-20 DIAGNOSIS — M545 Low back pain, unspecified: Secondary | ICD-10-CM

## 2023-01-20 LAB — POCT URINALYSIS DIP (MANUAL ENTRY)
Bilirubin, UA: NEGATIVE
Blood, UA: NEGATIVE
Glucose, UA: NEGATIVE mg/dL
Ketones, POC UA: NEGATIVE mg/dL
Leukocytes, UA: NEGATIVE
Nitrite, UA: NEGATIVE
Protein Ur, POC: NEGATIVE mg/dL
Spec Grav, UA: 1.01 (ref 1.010–1.025)
Urobilinogen, UA: 0.2 U/dL
pH, UA: 6 (ref 5.0–8.0)

## 2023-01-20 MED ORDER — TIZANIDINE HCL 4 MG PO TABS: 2.0000 mg | ORAL_TABLET | Freq: Four times a day (QID) | ORAL | 0 refills | Status: DC | PRN

## 2023-01-20 MED ORDER — PREDNISONE 20 MG PO TABS: 40.0000 mg | ORAL_TABLET | Freq: Every day | ORAL | 0 refills | Status: DC

## 2023-01-20 MED ORDER — PREDNISONE 20 MG PO TABS: 40.0000 mg | ORAL_TABLET | Freq: Every day | ORAL | 0 refills | Status: AC

## 2023-01-20 NOTE — ED Triage Notes (Signed)
"  I don't know if this is my recurrent sciatic nerve pain but my right lower back is hurting badly radiating down my buttocks, hip and down leg". No new/recent injury.

## 2023-01-20 NOTE — ED Provider Notes (Signed)
[name]    CSN: [id number] Arrival date & time: 01/20/23  1537      History   Chief Complaint Chief Complaint  Patient presents with   Hip Pain    Right    HPI Andrea Lowery is a 82 y.o. female.   Patient here today for evaluation of pain from her right lower back into her buttocks and posterior hip and down her leg.  She denies any new injury.  She has not had any loss of bowel or bladder function.  She has not had any fever.  She has taken over-the-counter medication without resolution.    The history is provided by the patient.  Hip Pain Pertinent negatives include no abdominal pain.    Past Medical History:  Diagnosis Date   Arthritis    Asthma    Asthma 01/10/2016   Last Assessment & Plan:  After recent ED eval- currrently utilizing prednisone as prescribed. Begin Levaquin, prednisone and Albuterol MDI to be utilized as needed for acute wheezing FU in 2 day is no improvement or worsening of symptoms. Pt verbalizes understanding for current treatment plan. Follow-up in one week. Plan to increase chronic inhaled steroid dose to assess further with prevention of   Diabetes mellitus without complication (HCC)    Diabetes II   Difficulty sleeping 12/28/2013   Last Assessment & Plan:  Stable with trazodone nightly. Continue current treatment plan. Refill trazodone today. Tolerating medication well without side effects. Return in 3 months for follow up or sooner if any worsening of symptoms.    DOE (dyspnea on exertion) 07/01/2012   DVT (deep venous thrombosis) (HCC)    "Years ago"   Dyslipidemia    Eczema    GERD (gastroesophageal reflux disease)    Headache    Migraine   Hypertension    Hypokalemia 04/30/2016   Hypothyroidism    LBBB (left bundle branch block)    MGUS (monoclonal gammopathy of unknown significance)    MGUS (monoclonal gammopathy of unknown significance)    Migraine    OSA on CPAP    Sinus infection    had sinus surgery    Urinary frequency 07/25/2021    Patient Active Problem List   Diagnosis Date Noted   Right shoulder tendinitis 06/17/2022   Seasonal allergic conjunctivitis 04/22/2022   Moderate persistent asthma 11/05/2021   Trigeminal neuralgia of left side of face 11/05/2021   Arthritis 11/05/2021   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   Vitamin D insufficiency 10/15/2021   De Quervain's tenosynovitis, left 09/21/2021   Seasonal and perennial allergic rhinitis 08/18/2019   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   Eczema 08/02/2016   Osteoporosis without current pathological fracture 05/30/2015   Chronic right-sided low back pain with right-sided sciatica 03/31/2015   Type 2 diabetes mellitus with hyperglycemia (HCC) 11/07/2014   Diastolic CHF (HCC) 11/07/2014   Sacroiliac dysfunction 11/04/2014   LPRD (laryngopharyngeal reflux disease) 10/05/2014   Hypothyroidism 09/21/2014   Thyroid nodule 07/01/2014   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/18/2014   Healthcare maintenance 01/25/2014   Hyperlipidemia 12/27/2013   Bilateral leg edema 07/20/2013   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/2013   Essential hypertension 07/01/2012    Past Surgical History:  Procedure Laterality Date   ABDOMINAL HYSTERECTOMY  1969   ARTERY BIOPSY N/A 11/07/2014   Procedure: BIOPSY TEMPORAL ARTERY;  Surgeon: Newman Pies, MD;  Location: [facility] OR;  Service: ENT;  Laterality: N/A;   CARPAL TUNNEL RELEASE Right    CHOLECYSTECTOMY  1976   ENDOVENOUS ABLATION SAPHENOUS VEIN W/ LASER Left 08/24/2020   endovenous laser ablation left greater saphenous vein by Cari Caraway MD   LUMBAR LAMINECTOMY/DECOMPRESSION MICRODISCECTOMY N/A 07/31/2015   Procedure: L4-5 Decompression, Possible Right L4-5 Microdiscectomy;  Surgeon: Eldred Manges, MD;  Location: [facility] OR;  Service: Orthopedics;  Laterality: N/A;   NASAL SINUS SURGERY     "years ago"   NM MYOVIEW LTD  02/13/2010   No ischemia   PLANTAR FASCIA RELEASE      TONSILLECTOMY  1965   US ECHOCARDIOGRAPHY  09/20/2008   borderline LVH,mild TR,AOV mildly sclerotic w/ca+ of the leaflets    OB History   No obstetric history on file.      Home Medications    Prior to Admission medications   Medication Sig Start Date End Date Taking? Authorizing Provider  predniSONE (DELTASONE) 20 MG tablet Take 2 tablets (40 mg total) by mouth daily with breakfast for 5 days. 01/20/23 01/25/23 Yes Tomi Bamberger, PA-C  tiZANidine (ZANAFLEX) 4 MG tablet Take 0.5-1 tablets (2-4 mg total) by mouth every 6 (six) hours as needed for muscle spasms. 01/20/23  Yes Tomi Bamberger, PA-C  albuterol (VENTOLIN HFA) 108 (90 Base) MCG/ACT inhaler INHALE 2 PUFFS EVERY 4 HOURS AS NEEDED FOR WHEEZING OR SHORTNESS OF BREATH 08/05/22   Hetty Blend, FNP  amLODipine-olmesartan (AZOR) 10-40 MG tablet TAKE 1 TABLET EVERY DAY 06/14/22   Dickie La, MD  azithromycin (ZITHROMAX) 500 MG tablet Take 1 tablet (500 mg total) by mouth every morning. 11/26/22   Kozlow, Alvira Philips, MD  Benralizumab (FASENRA PEN) 30 MG/ML SOAJ Inject 1 mL (30 mg total) into the skin every 8 (eight) weeks. 07/04/22   Hetty Blend, FNP  benzonatate (TESSALON) 100 MG capsule Take 1 capsule (100 mg total) by mouth 3 (three) times daily as needed for cough. Do not take with alcohol or while driving or operating heavy machinery.  May cause drowsiness. 10/08/22   Valentino Nose, NP  Blood Glucose Monitoring Suppl (TRUE METRIX METER) w/Device KIT Use as instructed 05/22/21   Dellis Filbert, MD  budesonide-formoterol (SYMBICORT) 160-4.5 MCG/ACT inhaler INHALE 2 PUFFS BY MOUTH INTO THE LUNGS TWICE DAILY RINSE GARGLE AND SPIT AFTER USE 11/26/22   Kozlow, Alvira Philips, MD  busPIRone (BUSPAR) 7.5 MG tablet Take 7.5 mg by mouth 2 (two) times daily. 01/10/23   [provider]  cetirizine (ZYRTEC) 10 MG tablet Take 1 tablet (10 mg total) by mouth daily as needed for allergies (Can take an extra dose during flare ups.). 11/26/22    Kozlow, Alvira Philips, MD  Dextrose, Diabetic Use, (RELION GLUCOSE PO) USE AS DIRECTED TO MEASURE BLOOD GLUCOSE UP TO ONE TIME DAILY 01/14/23   [provider]  diclofenac Sodium (VOLTAREN) 1 % GEL Apply to small joints of the hands and wrist up to 4 times daily 11/05/21   Dickie La, MD  doxycycline (VIBRA-TABS) 100 MG tablet Take 100 mg by mouth 2 (two) times daily. 11/12/22   [provider]  DULoxetine (CYMBALTA) 30 MG capsule Take 1 capsule (30 mg total) by mouth daily. 08/23/22   Rana Snare, DO  empagliflozin (JARDIANCE) 25 MG TABS tablet Take 1 tablet (25 mg total) by mouth daily before breakfast. 09/12/22   Dickie La, MD  EPINEPHrine 0.3 mg/0.3 mL IJ SOAJ injection Inject 0.3 mg into the muscle as needed for anaphylaxis. Use as directed for severe allergic reaction 11/26/22   Kozlow, Alvira Philips, MD  esomeprazole (  NEXIUM) 40 MG capsule Take 1 capsule (40 mg total) by mouth 2 (two) times daily. 11/26/22   Kozlow, Alvira Philips, MD  ezetimibe (ZETIA) 10 MG tablet TAKE 1 TABLET EVERY DAY 11/11/22   Dickie La, MD  fluticasone [facility]) 50 MCG/ACT nasal spray Place 2 sprays into both nostrils daily. 11/26/22   Kozlow, Alvira Philips, MD  FLUZONE HIGH-DOSE 0.5 ML injection Inject 0.5 mLs into the muscle once. 10/23/22   [provider]  glucose blood (TRUE METRIX BLOOD GLUCOSE TEST) test strip USE AS DIRECTED 03/19/22   Dickie La, MD  hydrOXYzine (ATARAX) 25 MG tablet TAKE 1 TABLET BY MOUTH AT BEDTIME FOR ANXIETY 09/17/22   Dickie La, MD  ipratropium-albuterol (DUONEB) 0.5-2.5 (3) MG/3ML SOLN Take 3 mLs by nebulization every 6 (six) hours as needed. 11/26/22   Kozlow, Alvira Philips, MD  levothyroxine (SYNTHROID) 50 MCG tablet TAKE 1 TABLET EVERY DAY, AT LEAST 30-60 MINUTES BEFORE OTHER FOOD OR MEDICINE. 08/12/22   Dickie La, MD  metoprolol succinate (TOPROL XL) 50 MG 24 hr tablet Take 1 tablet (50 mg total) by mouth daily. Take with or immediately following a meal. 07/24/21 11/26/22  Steffanie Rainwater, MD   montelukast (SINGULAIR) 10 MG tablet Take 1 tablet (10 mg total) by mouth at bedtime. 11/26/22   Kozlow, Alvira Philips, MD  Olopatadine-Mometasone [facility]) [id number] MCG/ACT SUSP Place 2 sprays into the nose 2 (two) times daily. 11/26/22   Kozlow, Alvira Philips, MD  rosuvastatin (CRESTOR) 40 MG tablet TAKE 1 TABLET EVERY DAY (NEED MD APPOINTMENT FOR REFILLS) 11/01/22   Dickie La, MD  [facility] injection Inject 0.5 mLs into the muscle once. 11/05/22   [provider]  spironolactone (ALDACTONE) 50 MG tablet Take 1.5 tablets (75 mg total) by mouth daily. 01/13/23   Dickie La, MD  TRUEplus Lancets 33G MISC USE AS DIRECTED to measure blood glucose up to one time daily. 01/13/23   Dickie La, MD  Vitamin D, Cholecalciferol, 25 MCG (1000 UT) CAPS Take 1 capsule by mouth daily. 09/17/22   Dickie La, MD    Family History Family History  Problem Relation Age of Onset   Dementia Mother    Gallbladder disease Mother    Diabetes Father    Heart failure Father    Hypertension Father    Sleep apnea Father    Kidney disease Father    Dementia Father    Thyroid disease Sister    Hypertension Sister    Dementia Sister    Healthy Sister     Social History Social History   Tobacco Use   Smoking status: Former    Current packs/day: 0.00    Types: Cigarettes    Quit date: 02/04/2006    Years since quitting: 16.9   Smokeless tobacco: Never  Vaping Use   Vaping status: Never Used  Substance Use Topics   Alcohol use: No    Alcohol/week: 0.0 standard drinks of alcohol   Drug use: No     Allergies   Bee venom and Penicillins   Review of Systems Review of Systems  Constitutional:  Negative for chills and fever.  Eyes:  Negative for discharge and redness.  Gastrointestinal:  Negative for abdominal pain, nausea and vomiting.  Musculoskeletal:  Positive for back pain and myalgias. Negative for arthralgias.  Neurological:  Negative for numbness.     Physical Exam Triage Vital Signs ED Triage  Vitals  Encounter Vitals Group     BP 01/20/23 1707 (!) 162/77  Systolic BP Percentile --      Diastolic BP Percentile --      Pulse Rate 01/20/23 1707 61     Resp 01/20/23 1707 18     Temp 01/20/23 1707 99 F (37.2 C)     Temp Source 01/20/23 1707 Oral     SpO2 01/20/23 1707 97 %     Weight 01/20/23 1706 188 lb 7.9 oz (85.5 kg)     Height 01/20/23 1706 5\' 7"  (1.702 m)     Head Circumference --      Peak Flow --      Pain Score 01/20/23 1703 9     Pain Loc --      Pain Education --      Exclude from Growth Chart --    No data found.  Updated Vital Signs BP (!) 162/77 (BP Location: Left Arm) Comment: "Thats good for me". You can recheck it later...  Pulse 61   Temp 99 F (37.2 C) (Oral)   Resp 18   Ht 5\' 7"  (1.702 m)   Wt 188 lb 7.9 oz (85.5 kg)   SpO2 97%   BMI 29.52 kg/m   Visual Acuity Right Eye Distance:   Left Eye Distance:   Bilateral Distance:    Right Eye Near:   Left Eye Near:    Bilateral Near:     Physical Exam Vitals and nursing note reviewed.  Constitutional:      General: She is not in acute distress.    Appearance: Normal appearance. She is not ill-appearing.  HENT:     Head: Normocephalic and atraumatic.  Eyes:     Conjunctiva/sclera: Conjunctivae normal.  Cardiovascular:     Rate and Rhythm: Normal rate.  Pulmonary:     Effort: Pulmonary effort is normal. No respiratory distress.  Musculoskeletal:     Comments: No tenderness to palpation to thoracic or lumbar spine.  Mild tenderness noted to right lower back.  Neurological:     Mental Status: She is alert.  Psychiatric:        Mood and Affect: Mood normal.        Behavior: Behavior normal.        Thought Content: Thought content normal.      UC Treatments / Results  Labs (all labs ordered are listed, but only abnormal results are displayed) Labs Reviewed  POCT URINALYSIS DIP (MANUAL ENTRY) - Abnormal; Notable for the following components:      Result Value   Clarity, UA  cloudy (*)    All other components within normal limits    EKG   Radiology No results found.  Procedures Procedures (including critical care time)  Medications Ordered in UC Medications - No data to display  Initial Impression / Assessment and Plan / UC Course  I have reviewed the triage vital signs and the nursing notes.  Pertinent labs & imaging results that were available during my care of the patient were reviewed by me and considered in my medical decision making (see chart for details).    Steroid burst prescribed for suspected muscle strain with possible sciatica.  Will also prescribe low-dose tizanidine and advised to take 1/2 tablet initially to determine side effects and lowest effective dose.  Patient expresses understanding.  Encouraged follow-up if no gradual improvement with any further concerns.  UA without concerning findings.  Final Clinical Impressions(s) / UC Diagnoses   Final diagnoses:  Acute right-sided low back pain, unspecified whether sciatica present  Discharge Instructions   None    ED Prescriptions     Medication Sig Dispense Auth. Provider   predniSONE (DELTASONE) 20 MG tablet Take 2 tablets (40 mg total) by mouth daily with breakfast for 5 days. 10 tablet Erma Pinto F, PA-C   tiZANidine (ZANAFLEX) 4 MG tablet Take 0.5-1 tablets (2-4 mg total) by mouth every 6 (six) hours as needed for muscle spasms. 30 tablet Tomi Bamberger, PA-C      PDMP not reviewed this encounter.   Tomi Bamberger, PA-C 01/20/23 1843

## 2023-01-24 ENCOUNTER — Other Ambulatory Visit: Payer: Self-pay | Admitting: Internal Medicine

## 2023-01-24 NOTE — Telephone Encounter (Signed)
Medication is not on current med list. Next appt scheduled 02/18/23 with PCP.

## 2023-01-30 ENCOUNTER — Other Ambulatory Visit: Payer: Self-pay

## 2023-01-30 ENCOUNTER — Emergency Department (HOSPITAL_COMMUNITY)
Admission: EM | Admit: 2023-01-30 | Discharge: 2023-01-31 | Disposition: A | Discharge status: Home / Self Care | Payer: Medicare HMO | Attending: Emergency Medicine | Admitting: Emergency Medicine

## 2023-01-30 ENCOUNTER — Encounter (HOSPITAL_COMMUNITY): Payer: Self-pay

## 2023-01-30 DIAGNOSIS — E119 Type 2 diabetes mellitus without complications: Secondary | ICD-10-CM | POA: Diagnosis not present

## 2023-01-30 DIAGNOSIS — M545 Low back pain, unspecified: Secondary | ICD-10-CM | POA: Diagnosis present

## 2023-01-30 DIAGNOSIS — M5441 Lumbago with sciatica, right side: Secondary | ICD-10-CM | POA: Insufficient documentation

## 2023-01-30 DIAGNOSIS — Z87891 Personal history of nicotine dependence: Secondary | ICD-10-CM | POA: Diagnosis not present

## 2023-01-30 DIAGNOSIS — J45909 Unspecified asthma, uncomplicated: Secondary | ICD-10-CM | POA: Insufficient documentation

## 2023-01-30 DIAGNOSIS — I10 Essential (primary) hypertension: Secondary | ICD-10-CM | POA: Insufficient documentation

## 2023-01-30 NOTE — ED Triage Notes (Signed)
Pt complaining of right hip pain that starts in the back and radiates to the pelvis. Was put on Tizandine last week to see if it helps but it hasn't. Pain has been going on for a week and a half

## 2023-01-31 ENCOUNTER — Emergency Department (HOSPITAL_COMMUNITY): Payer: Medicare HMO

## 2023-01-31 DIAGNOSIS — M5441 Lumbago with sciatica, right side: Secondary | ICD-10-CM | POA: Diagnosis not present

## 2023-01-31 LAB — BASIC METABOLIC PANEL
Anion gap: 9 (ref 5–15)
BUN: 5 mg/dL — ABNORMAL LOW (ref 8–23)
CO2: 25 mmol/L (ref 22–32)
Calcium: 9.2 mg/dL (ref 8.9–10.3)
Chloride: 104 mmol/L (ref 98–111)
Creatinine, Ser: 0.79 mg/dL (ref 0.44–1.00)
GFR, Estimated: >60 mL/min (ref >60)
Glucose, Bld: 119 mg/dL — ABNORMAL HIGH (ref 70–99)
Potassium: 3.6 mmol/L (ref 3.5–5.1)
Sodium: 138 mmol/L (ref 135–145)

## 2023-01-31 LAB — CBC
HCT: 40.1 % (ref 36.0–46.0)
Hemoglobin: 13.5 g/dL (ref 12.0–15.0)
MCH: 27.8 pg (ref 26.0–34.0)
MCHC: 33.7 g/dL (ref 30.0–36.0)
MCV: 82.5 fL (ref 80.0–100.0)
Platelets: 198 10*3/uL (ref 150–400)
RBC: 4.86 MIL/uL (ref 3.87–5.11)
RDW: 14.7 % (ref 11.5–15.5)
WBC: 5.5 10*3/uL (ref 4.0–10.5)
nRBC: 0 % (ref 0.0–0.2)

## 2023-01-31 MED ORDER — LIDOCAINE 5 % EX PTCH: 1.0000 | MEDICATED_PATCH | CUTANEOUS | 0 refills | Status: DC

## 2023-01-31 MED ORDER — OXYCODONE HCL 5 MG PO TABS
5.0000 mg | ORAL_TABLET | Freq: Once | ORAL | Status: AC
  Administered 2023-01-31: 5 mg via ORAL
  Filled 2023-01-31: qty 1

## 2023-01-31 MED ORDER — NAPROXEN 500 MG PO TABS: 500.0000 mg | ORAL_TABLET | Freq: Two times a day (BID) | ORAL | 0 refills | Status: DC

## 2023-01-31 MED ORDER — METHOCARBAMOL 500 MG PO TABS: 500.0000 mg | ORAL_TABLET | Freq: Three times a day (TID) | ORAL | 0 refills | Status: DC | PRN

## 2023-01-31 NOTE — Discharge Instructions (Addendum)
You were evaluated in the Emergency Department and after careful evaluation, we did not find any emergent condition requiring admission or further testing in the hospital.  Your exam/testing today is overall reassuring.  Symptoms likely due to degenerative disc disease in your back.  Recommend follow-up with the spine experts.  Use Tylenol 1000 mg every 4-6 hours for pain.  Also recommend using the Naprosyn anti-inflammatory twice daily for pain.  Use the Robaxin muscle relaxer for more significant pain.  Use the numbing patches daily as well.  Please return to the Emergency Department if you experience any worsening of your condition.   Thank you for allowing [place] to be a part of your care.

## 2023-01-31 NOTE — ED Provider Notes (Addendum)
[facility]PT [facility] Emergency Department Provider Note MRN:  [id number]  Arrival date & time: 01/31/23     Chief Complaint   Back pain History of Present Illness   Andrea Lowery is a 82 y.o. year-old female with a history of diabetes presenting to the ED with chief complaint of back pain.  Pain to the right lower back/buttocks with radiation around the right hip and the leg into the right anterior thigh.  Present for about a week, getting worse.  Starting to have sensation of weakness to the right leg, has had some urinary incontinence this week which is abnormal for her.  Review of Systems  A thorough review of systems was obtained and all systems are negative except as noted in the HPI and PMH.   Patient's Health History    Past Medical History:  Diagnosis Date   Arthritis    Asthma    Asthma 01/10/2016   Last Assessment & Plan:  After recent ED eval- currrently utilizing prednisone as prescribed. Begin Levaquin, prednisone and Albuterol MDI to be utilized as needed for acute wheezing FU in 2 day is no improvement or worsening of symptoms. Pt verbalizes understanding for current treatment plan. Follow-up in one week. Plan to increase chronic inhaled steroid dose to assess further with prevention of   Diabetes mellitus without complication (HCC)    Diabetes II   Difficulty sleeping 12/28/2013   Last Assessment & Plan:  Stable with trazodone nightly. Continue current treatment plan. Refill trazodone today. Tolerating medication well without side effects. Return in 3 months for follow up or sooner if any worsening of symptoms.    DOE (dyspnea on exertion) 07/01/2012   DVT (deep venous thrombosis) (HCC)    "Years ago"   Dyslipidemia    Eczema    GERD (gastroesophageal reflux disease)    Headache    Migraine   Hypertension    Hypokalemia 04/30/2016   Hypothyroidism    LBBB (left bundle branch block)    MGUS (monoclonal gammopathy of unknown significance)     MGUS (monoclonal gammopathy of unknown significance)    Migraine    OSA on CPAP    Sinus infection    had sinus surgery   Urinary frequency 07/25/2021    Past Surgical History:  Procedure Laterality Date   ABDOMINAL HYSTERECTOMY  1969   ARTERY BIOPSY N/A 11/07/2014   Procedure: BIOPSY TEMPORAL ARTERY;  Surgeon: Newman Pies, MD;  Location: [facility] OR;  Service: ENT;  Laterality: N/A;   CARPAL TUNNEL RELEASE Right    CHOLECYSTECTOMY  1976   ENDOVENOUS ABLATION SAPHENOUS VEIN W/ LASER Left 08/24/2020   endovenous laser ablation left greater saphenous vein by Cari Caraway MD   LUMBAR LAMINECTOMY/DECOMPRESSION MICRODISCECTOMY N/A 07/31/2015   Procedure: L4-5 Decompression, Possible Right L4-5 Microdiscectomy;  Surgeon: Eldred Manges, MD;  Location: [facility] OR;  Service: Orthopedics;  Laterality: N/A;   NASAL SINUS SURGERY     "years ago"   NM MYOVIEW LTD  02/13/2010   No ischemia   PLANTAR FASCIA RELEASE     TONSILLECTOMY  1965   US ECHOCARDIOGRAPHY  09/20/2008   borderline LVH,mild TR,AOV mildly sclerotic w/ca+ of the leaflets    Family History  Problem Relation Age of Onset   Dementia Mother    Gallbladder disease Mother    Diabetes Father    Heart failure Father    Hypertension Father    Sleep apnea Father    Kidney disease Father    Dementia  Father    Thyroid disease Sister    Hypertension Sister    Dementia Sister    Healthy Sister     Social History   Socioeconomic History   Marital status: Divorced    Spouse name: Not on file   Number of children: Not on file   Years of education: Not on file   Highest education level: Not on file  Occupational History   Not on file  Tobacco Use   Smoking status: Former    Current packs/day: 0.00    Types: Cigarettes    Quit date: 02/04/2006    Years since quitting: 17.0   Smokeless tobacco: Never  Vaping Use   Vaping status: Never Used  Substance and Sexual Activity   Alcohol use: No    Alcohol/week: 0.0 standard drinks of alcohol    Drug use: No   Sexual activity: Not Currently  Other Topics Concern   Not on file  Social History Narrative   Lives with sister in a one story home.  Has one child.  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     Right handed   Caffeine: 24 oz/day of pepsi   Social Drivers of Health   Financial Resource Strain: Low Risk  (07/31/2022)   Overall Financial Resource Strain (CARDIA)    Difficulty of Paying Living Expenses: Not hard at all  Food Insecurity: No Food Insecurity (07/31/2022)   Hunger Vital Sign    Worried About Running Out of Food in the Last Year: Never true    Ran Out of Food in the Last Year: Never true  Transportation Needs: No Transportation Needs (07/31/2022)   PRAPARE - Administrator, Civil Service (Medical): No    Lack of Transportation (Non-Medical): No  Physical Activity: Inactive (07/31/2022)   Exercise Vital Sign    Days of Exercise per Week: 0 days    Minutes of Exercise per Session: 0 min  Stress: Stress Concern Present (07/31/2022)   [employer] of Occupational Health - Occupational Stress Questionnaire    Feeling of Stress : To some extent  Social Connections: Moderately Integrated (07/31/2022)   Social Connection and Isolation Panel [NHANES]    Frequency of Communication with Friends and Family: More than three times a week    Frequency of Social Gatherings with Friends and Family: More than three times a week    Attends Religious Services: More than 4 times per year    Active Member of [employer] or Organizations: Yes    Attends Banker Meetings: More than 4 times per year    Marital Status: Divorced  Intimate Partner Violence: Not At Risk (07/31/2022)   Humiliation, Afraid, Rape, and Kick questionnaire    Fear of Current or Ex-Partner: No    Emotionally Abused: No    Physically Abused: No    Sexually Abused: No     Physical Exam   Vitals:   01/31/23 0118 01/31/23 0543  BP: (!) 160/72 (!) 185/75  Pulse: 66 60  Resp: 19 17  Temp: 98.2 F  (36.8 C) 98.3 F (36.8 C)  SpO2: 99% 99%    CONSTITUTIONAL: Well-appearing, NAD NEURO/PSYCH:  Alert and oriented x 3, no focal deficits EYES:  eyes equal and reactive ENT/NECK:  no LAD, no JVD CARDIO: Regular rate, well-perfused, normal S1 and S2 PULM:  CTAB no wheezing or rhonchi GI/GU:  non-distended, non-tender MSK/SPINE:  No gross deformities, no edema SKIN:  no rash, atraumatic   *Additional and/or pertinent findings included in MDM  below  Diagnostic and Interventional Summary    EKG Interpretation Date/Time:    Ventricular Rate:    PR Interval:    QRS Duration:    QT Interval:    QTC Calculation:   R Axis:      Text Interpretation:         Labs Reviewed  BASIC METABOLIC PANEL - Abnormal; Notable for the following components:      Result Value   Glucose, Bld 119 (*)    BUN 5 (*)    All other components within normal limits  CBC    MR LUMBAR SPINE WO CONTRAST  Final Result    MR THORACIC SPINE WO CONTRAST  Final Result      Medications  oxyCODONE (Oxy IR/ROXICODONE) immediate release tablet 5 mg (5 mg Oral Given 01/31/23 0601)     Procedures  /  Critical Care Procedures  ED Course and Medical Decision Making  Initial Impression and Ddx Decreased functionality of the right leg, question related to pain versus true weakness.  Endorsing urinary incontinence, question overflow incontinence.  Given this and the radicular type of symptoms there is concern for myelopathy and patient will need MRI.  Past medical/surgical history that increases complexity of ED encounter: Diabetes  Interpretation of Diagnostics Labs without significant blood count or electrolyte disturbance, MRI without evidence of myelopathy.  Patient Reassessment and Ultimate Disposition/Management    Patient feeling much better, appropriate for discharge.   Patient management required discussion with the following services or consulting groups:  None  Complexity of Problems  Addressed Acute illness or injury that poses threat of life of bodily function  Additional Data Reviewed and Analyzed Further history obtained from: None  Additional Factors Impacting ED Encounter Risk Consideration of hospitalization  Elmer Sow. Pilar Plate, MD [facility] Emergency Medicine [facility] [email or web address]  Final Clinical Impressions(s) / ED Diagnoses     ICD-10-CM   1. Acute right-sided low back pain with right-sided sciatica  M54.41       ED Discharge Orders          Ordered    naproxen (NAPROSYN) 500 MG tablet  2 times daily        01/31/23 0731    methocarbamol (ROBAXIN) 500 MG tablet  Every 8 hours PRN        01/31/23 0731    lidocaine (LIDODERM) 5 %  Every 24 hours        01/31/23 0731             Discharge Instructions Discussed with and Provided to Patient:     Discharge Instructions      You were evaluated in the Emergency Department and after careful evaluation, we did not find any emergent condition requiring admission or further testing in the hospital.  Your exam/testing today is overall reassuring.  Symptoms likely due to degenerative disc disease in your back.  Recommend follow-up with the spine experts.  Use Tylenol 1000 mg every 4-6 hours for pain.  Also recommend using the Naprosyn anti-inflammatory twice daily for pain.  Use the Robaxin muscle relaxer for more significant pain.  Use the numbing patches daily as well.  Please return to the Emergency Department if you experience any worsening of your condition.   Thank you for allowing [place] to be a part of your care.       Sabas Sous, MD 01/31/23 2956    Sabas Sous, MD 01/31/23 [phone]

## 2023-02-11 ENCOUNTER — Ambulatory Visit: Payer: Medicare HMO | Admitting: *Deleted

## 2023-02-11 DIAGNOSIS — T63441D Toxic effect of venom of bees, accidental (unintentional), subsequent encounter: Secondary | ICD-10-CM

## 2023-02-15 ENCOUNTER — Other Ambulatory Visit: Payer: Self-pay | Admitting: Internal Medicine

## 2023-02-15 DIAGNOSIS — E1169 Type 2 diabetes mellitus with other specified complication: Secondary | ICD-10-CM

## 2023-02-18 ENCOUNTER — Encounter: Payer: Medicare HMO | Admitting: Internal Medicine

## 2023-03-10 ENCOUNTER — Ambulatory Visit (INDEPENDENT_AMBULATORY_CARE_PROVIDER_SITE_OTHER): Payer: Medicare HMO

## 2023-03-10 DIAGNOSIS — J4550 Severe persistent asthma, uncomplicated: Secondary | ICD-10-CM | POA: Diagnosis not present

## 2023-03-16 ENCOUNTER — Encounter (HOSPITAL_COMMUNITY): Payer: Self-pay | Admitting: Emergency Medicine

## 2023-03-16 ENCOUNTER — Ambulatory Visit (HOSPITAL_COMMUNITY)
Admission: EM | Admit: 2023-03-16 | Discharge: 2023-03-16 | Disposition: A | Discharge status: Home / Self Care | Payer: Medicare HMO | Attending: Internal Medicine | Admitting: Internal Medicine

## 2023-03-16 DIAGNOSIS — R6889 Other general symptoms and signs: Secondary | ICD-10-CM

## 2023-03-16 DIAGNOSIS — Z20828 Contact with and (suspected) exposure to other viral communicable diseases: Secondary | ICD-10-CM | POA: Diagnosis not present

## 2023-03-16 DIAGNOSIS — J4521 Mild intermittent asthma with (acute) exacerbation: Secondary | ICD-10-CM

## 2023-03-16 MED ORDER — ONDANSETRON 4 MG PO TBDP: 4.0000 mg | ORAL_TABLET | Freq: Three times a day (TID) | ORAL | 0 refills | Status: DC | PRN

## 2023-03-16 MED ORDER — BENZONATATE 100 MG PO CAPS: 100.0000 mg | ORAL_CAPSULE | Freq: Three times a day (TID) | ORAL | 0 refills | Status: DC

## 2023-03-16 MED ORDER — OSELTAMIVIR PHOSPHATE 75 MG PO CAPS: 75.0000 mg | ORAL_CAPSULE | Freq: Two times a day (BID) | ORAL | 0 refills | Status: DC

## 2023-03-16 MED ORDER — PREDNISONE 20 MG PO TABS: 40.0000 mg | ORAL_TABLET | Freq: Every day | ORAL | 0 refills | Status: AC

## 2023-03-16 NOTE — ED Triage Notes (Signed)
 Patient presents with c/o nasal congestion, non productive cough, vomiting, chills and diarrhea x 1 day.

## 2023-03-16 NOTE — ED Provider Notes (Signed)
 [facility]    CSN: [id number] Arrival date & time: 03/16/23  1501      History   Chief Complaint Chief Complaint  Patient presents with   Emesis   Diarrhea   Cough   Chills   Nasal Congestion    HPI MADDISYN HEGWOOD is a 83 y.o. female.   83 year old female who presents urgent care with complaints of influenza-like symptoms.  She reports for the last 2 days she has had nasal congestion, cough, fever, chills, diarrhea, vomiting and generalized bodyaches with fatigue.  She reports that in the last week she has been exposed to influenza multiple times with her grandchildren and members of her church.  She was with her grandchildren who are hospitalized secondary to influenza.  She is trying to stay hydrated.  She denies chest pain or shortness of breath.   Emesis Associated symptoms: chills, cough, diarrhea and fever   Associated symptoms: no abdominal pain, no arthralgias and no sore throat   Diarrhea Associated symptoms: chills, fever and vomiting   Associated symptoms: no abdominal pain and no arthralgias   Cough Associated symptoms: chills and fever   Associated symptoms: no chest pain, no ear pain, no rash, no shortness of breath and no sore throat     Past Medical History:  Diagnosis Date   Arthritis    Asthma    Asthma 01/10/2016   Last Assessment & Plan:  After recent ED eval- currrently utilizing prednisone  as prescribed. Begin Levaquin , prednisone  and Albuterol  MDI to be utilized as needed for acute wheezing FU in 2 day is no improvement or worsening of symptoms. Pt verbalizes understanding for current treatment plan. Follow-up in one week. Plan to increase chronic inhaled steroid dose to assess further with prevention of   Diabetes mellitus without complication (HCC)    Diabetes II   Difficulty sleeping 12/28/2013   Last Assessment & Plan:  Stable with trazodone  nightly. Continue current treatment plan. Refill trazodone  today. Tolerating medication  well without side effects. Return in 3 months for follow up or sooner if any worsening of symptoms.    DOE (dyspnea on exertion) 07/01/2012   DVT (deep venous thrombosis) (HCC)    Years ago   Dyslipidemia    Eczema    GERD (gastroesophageal reflux disease)    Headache    Migraine   Hypertension    Hypokalemia 04/30/2016   Hypothyroidism    LBBB (left bundle branch block)    MGUS (monoclonal gammopathy of unknown significance)    MGUS (monoclonal gammopathy of unknown significance)    Migraine    OSA on CPAP    Sinus infection    had sinus surgery   Urinary frequency 07/25/2021    Patient Active Problem List   Diagnosis Date Noted   Right shoulder tendinitis 06/17/2022   Seasonal allergic conjunctivitis 04/22/2022   Moderate persistent asthma 11/05/2021   Trigeminal neuralgia of left side of face 11/05/2021   Arthritis 11/05/2021   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   Vitamin D  insufficiency 10/15/2021   De Quervain's tenosynovitis, left 09/21/2021   Seasonal and perennial allergic rhinitis 08/18/2019   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   Eczema 08/02/2016   Osteoporosis without current pathological fracture 05/30/2015   Chronic right-sided low back pain with right-sided sciatica 03/31/2015   Type 2 diabetes mellitus with hyperglycemia (HCC) 11/07/2014   Diastolic CHF (HCC) 11/07/2014   Sacroiliac dysfunction 11/04/2014   LPRD (laryngopharyngeal reflux disease) 10/05/2014   Hypothyroidism 09/21/2014   Thyroid  nodule  07/01/2014   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/18/2014   Healthcare maintenance 01/25/2014   Hyperlipidemia 12/27/2013   Bilateral leg edema 07/20/2013   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/2013   Essential hypertension 07/01/2012    Past Surgical History:  Procedure Laterality Date   ABDOMINAL HYSTERECTOMY  1969   ARTERY BIOPSY N/A 11/07/2014   Procedure: BIOPSY TEMPORAL ARTERY;  Surgeon: Daniel Moccasin, MD;  Location: [facility]  OR;  Service: ENT;  Laterality: N/A;   CARPAL TUNNEL RELEASE Right    CHOLECYSTECTOMY  1976   ENDOVENOUS ABLATION SAPHENOUS VEIN W/ LASER Left 08/24/2020   endovenous laser ablation left greater saphenous vein by Medford Blade MD   LUMBAR LAMINECTOMY/DECOMPRESSION MICRODISCECTOMY N/A 07/31/2015   Procedure: L4-5 Decompression, Possible Right L4-5 Microdiscectomy;  Surgeon: Oneil JAYSON Herald, MD;  Location: [facility];  Service: Orthopedics;  Laterality: N/A;   NASAL SINUS SURGERY     years ago   NM MYOVIEW  LTD  02/13/2010   No ischemia   PLANTAR FASCIA RELEASE     TONSILLECTOMY  1965   US  ECHOCARDIOGRAPHY  09/20/2008   borderline LVH,mild TR,AOV mildly sclerotic w/ca+ of the leaflets    OB History   No obstetric history on file.      Home Medications    Prior to Admission medications   Medication Sig Start Date End Date Taking? Authorizing Provider  benzonatate  (TESSALON ) 100 MG capsule Take 1 capsule (100 mg total) by mouth every 8 (eight) hours. 03/16/23  Yes Dilan Novosad A, PA-C  ondansetron  (ZOFRAN -ODT) 4 MG disintegrating tablet Take 1 tablet (4 mg total) by mouth every 8 (eight) hours as needed for nausea or vomiting. 03/16/23  Yes Verlan Grotz A, PA-C  oseltamivir  (TAMIFLU ) 75 MG capsule Take 1 capsule (75 mg total) by mouth every 12 (twelve) hours. 03/16/23  Yes Ysidra Sopher A, PA-C  predniSONE  (DELTASONE ) 20 MG tablet Take 2 tablets (40 mg total) by mouth daily with breakfast for 5 days. 03/16/23 03/21/23 Yes Sena Clouatre A, PA-C  albuterol  (VENTOLIN  HFA) 108 (90 Base) MCG/ACT inhaler INHALE 2 PUFFS EVERY 4 HOURS AS NEEDED FOR WHEEZING OR SHORTNESS OF BREATH 08/05/22   Ambs, Arlean HERO, FNP  amLODipine -olmesartan  (AZOR ) 10-40 MG tablet TAKE 1 TABLET EVERY DAY 06/14/22   Karna Fellows, MD  azithromycin  (ZITHROMAX ) 500 MG tablet Take 1 tablet (500 mg total) by mouth every morning. 11/26/22   Kozlow, Camellia PARAS, MD  Benralizumab  (FASENRA  PEN) 30 MG/ML SOAJ Inject 1 mL (30 mg total) into the  skin every 8 (eight) weeks. 07/04/22   Cari Arlean HERO, FNP  Blood Glucose Monitoring Suppl (TRUE METRIX METER) w/Device KIT Use as instructed 05/22/21   Ariwodo, Shirley, MD  budesonide -formoterol  (SYMBICORT ) 160-4.5 MCG/ACT inhaler INHALE 2 PUFFS BY MOUTH INTO THE LUNGS TWICE DAILY RINSE GARGLE AND SPIT AFTER USE 11/26/22   Kozlow, Camellia PARAS, MD  busPIRone  (BUSPAR ) 7.5 MG tablet Take 7.5 mg by mouth 2 (two) times daily. 01/10/23   [provider]  cetirizine  (ZYRTEC ) 10 MG tablet Take 1 tablet (10 mg total) by mouth daily as needed for allergies (Can take an extra dose during flare ups.). 11/26/22   Kozlow, Camellia PARAS, MD  Dextrose , Diabetic Use, (RELION GLUCOSE PO) USE AS DIRECTED TO MEASURE BLOOD GLUCOSE UP TO ONE TIME DAILY 01/14/23   [provider]  diclofenac  Sodium (VOLTAREN ) 1 % GEL Apply to small joints of the hands and wrist up to 4 times daily 11/05/21   Karna Fellows, MD  doxycycline  (VIBRA -TABS) 100 MG tablet Take 100 mg by mouth 2 (two) times daily. 11/12/22   [provider]  DULoxetine  (CYMBALTA ) 30 MG capsule Take 1 capsule (30 mg total) by mouth daily. 08/23/22   Zheng, Michael, DO  empagliflozin  (JARDIANCE ) 25 MG TABS tablet Take 1 tablet (25 mg total) by mouth daily before breakfast. 09/12/22   Karna Fellows, MD  EPINEPHrine  0.3 mg/0.3 mL IJ SOAJ injection Inject 0.3 mg into the muscle as needed for anaphylaxis. Use as directed for severe allergic reaction 11/26/22   Kozlow, Camellia PARAS, MD  esomeprazole  (NEXIUM ) 40 MG capsule Take 1 capsule (40 mg total) by mouth 2 (two) times daily. 11/26/22   Kozlow, Camellia PARAS, MD  ezetimibe  (ZETIA ) 10 MG tablet TAKE 1 TABLET EVERY DAY 11/11/22   Karna Fellows, MD  fluticasone  (FLONASE ) 50 MCG/ACT nasal spray Place 2 sprays into both nostrils daily. 11/26/22   Kozlow, Camellia PARAS, MD  FLUZONE HIGH-DOSE 0.5 ML injection Inject 0.5 mLs into the muscle once. 10/23/22   [provider]  glucose blood (TRUE METRIX BLOOD GLUCOSE TEST) test strip USE AS  DIRECTED 03/19/22   Karna Fellows, MD  hydrOXYzine  (ATARAX ) 25 MG tablet TAKE 1 TABLET BY MOUTH AT BEDTIME FOR ANXIETY 09/17/22   Karna Fellows, MD  ipratropium-albuterol  (DUONEB) 0.5-2.5 (3) MG/3ML SOLN Take 3 mLs by nebulization every 6 (six) hours as needed. 11/26/22   Kozlow, Camellia PARAS, MD  levothyroxine  (SYNTHROID ) 50 MCG tablet TAKE 1 TABLET EVERY DAY, AT LEAST 30-60 MINUTES BEFORE OTHER FOOD OR MEDICINE. 08/12/22   Karna Fellows, MD  lidocaine  (LIDODERM ) 5 % Place 1 patch onto the skin daily. Remove & Discard patch within 12 hours or as directed by MD 01/31/23   Theadore Ozell HERO, MD  methocarbamol  (ROBAXIN ) 500 MG tablet Take 1 tablet (500 mg total) by mouth every 8 (eight) hours as needed for muscle spasms. 01/31/23   Bero, Michael M, MD  metoprolol  succinate (TOPROL  XL) 50 MG 24 hr tablet Take 1 tablet (50 mg total) by mouth daily. Take with or immediately following a meal. 07/24/21 11/26/22  Lou Claretta HERO, MD  montelukast  (SINGULAIR ) 10 MG tablet Take 1 tablet (10 mg total) by mouth at bedtime. 11/26/22   Kozlow, Camellia PARAS, MD  naproxen  (NAPROSYN ) 500 MG tablet Take 1 tablet (500 mg total) by mouth 2 (two) times daily. 01/31/23   Bero, Michael M, MD  Olopatadine -Mometasone  (RYALTRIS ) 665-25 MCG/ACT SUSP Place 2 sprays into the nose 2 (two) times daily. 11/26/22   Kozlow, Camellia PARAS, MD  rosuvastatin  (CRESTOR ) 40 MG tablet TAKE 1 TABLET EVERY DAY (NEED MD APPOINTMENT FOR REFILLS) 11/01/22   Karna Fellows, MD  [facility] injection Inject 0.5 mLs into the muscle once. 11/05/22   [provider]  spironolactone  (ALDACTONE ) 50 MG tablet Take 1.5 tablets (75 mg total) by mouth daily. 01/13/23   Karna Fellows, MD  TRUEplus Lancets 33G MISC USE AS DIRECTED to measure blood glucose up to one time daily. 01/13/23   Karna Fellows, MD  Vitamin D , Cholecalciferol , 25 MCG (1000 UT) CAPS Take 1 capsule by mouth daily. 09/17/22   Karna Fellows, MD    Family History Family History  Problem Relation Age of Onset   Dementia Mother     Gallbladder disease Mother    Diabetes Father    Heart failure Father    Hypertension Father    Sleep apnea Father    Kidney disease Father    Dementia Father    Thyroid   disease Sister    Hypertension Sister    Dementia Sister    Healthy Sister     Social History Social History   Tobacco Use   Smoking status: Former    Current packs/day: 0.00    Types: Cigarettes    Quit date: 02/04/2006    Years since quitting: 17.1   Smokeless tobacco: Never  Vaping Use   Vaping status: Never Used  Substance Use Topics   Alcohol  use: No    Alcohol /week: 0.0 standard drinks of alcohol    Drug use: No     Allergies   Bee venom and Penicillins   Review of Systems Review of Systems  Constitutional:  Positive for chills, fatigue and fever.  HENT:  Negative for ear pain and sore throat.   Eyes:  Negative for pain and visual disturbance.  Respiratory:  Positive for cough. Negative for shortness of breath.   Cardiovascular:  Negative for chest pain and palpitations.  Gastrointestinal:  Positive for diarrhea, nausea and vomiting. Negative for abdominal pain.  Genitourinary:  Negative for dysuria and hematuria.  Musculoskeletal:  Negative for arthralgias and back pain.  Skin:  Negative for color change and rash.  Neurological:  Negative for seizures and syncope.  All other systems reviewed and are negative.    Physical Exam Triage Vital Signs ED Triage Vitals  Encounter Vitals Group     BP 03/16/23 1630 (!) 179/91     Systolic BP Percentile --      Diastolic BP Percentile --      Pulse Rate 03/16/23 1630 73     Resp 03/16/23 1630 18     Temp 03/16/23 1630 98.2 F (36.8 C)     Temp Source 03/16/23 1630 Oral     SpO2 03/16/23 1630 98 %     Weight --      Height --      Head Circumference --      Peak Flow --      Pain Score 03/16/23 1632 0     Pain Loc --      Pain Education --      Exclude from Growth Chart --    No data found.  Updated Vital Signs BP (!) 179/91 (BP  Location: Left Arm)   Pulse 73   Temp 98.2 F (36.8 C) (Oral)   Resp 18   SpO2 98%   Visual Acuity Right Eye Distance:   Left Eye Distance:   Bilateral Distance:    Right Eye Near:   Left Eye Near:    Bilateral Near:     Physical Exam Vitals and nursing note reviewed.  Constitutional:      General: She is not in acute distress.    Appearance: She is well-developed.  HENT:     Head: Normocephalic and atraumatic.     Right Ear: Tympanic membrane normal.     Left Ear: Tympanic membrane normal.     Nose: Nose normal.     Mouth/Throat:     Mouth: Mucous membranes are moist.  Eyes:     Conjunctiva/sclera: Conjunctivae normal.  Cardiovascular:     Rate and Rhythm: Normal rate and regular rhythm.     Heart sounds: No murmur heard. Pulmonary:     Effort: Pulmonary effort is normal. No respiratory distress.     Breath sounds: Normal breath sounds.  Abdominal:     Palpations: Abdomen is soft.     Tenderness: There is no abdominal tenderness.  Musculoskeletal:  General: No swelling.     Cervical back: Neck supple.  Skin:    General: Skin is warm and dry.     Capillary Refill: Capillary refill takes less than 2 seconds.  Neurological:     Mental Status: She is alert.  Psychiatric:        Mood and Affect: Mood normal.      UC Treatments / Results  Labs (all labs ordered are listed, but only abnormal results are displayed) Labs Reviewed - No data to display  EKG   Radiology No results found.  Procedures Procedures (including critical care time)  Medications Ordered in UC Medications - No data to display  Initial Impression / Assessment and Plan / UC Course  I have reviewed the triage vital signs and the nursing notes.  Pertinent labs & imaging results that were available during my care of the patient were reviewed by me and considered in my medical decision making (see chart for details).     Exposure to influenza  Influenza-like symptoms  Mild  intermittent asthma with acute exacerbation   Significant exposure to influenza with symptoms consistent with influenza with asthma symptoms. We will treat with the following:  Tamiflu  75 mg twice daily for 5 days Prednisone  40 mg (2 tablets) once daily for 5 days. Take this in the morning.  This is a steroid to help with inflammation and pain.  Zofran  4 mg orally disintegrating tablet every 8 hours as needed for nausea. Benzonatate  (tessalon ) 100 mg every 8 hours as needed for cough.  Rest and stay hydrated.  Take in as much fluid as you can even if you can't eat solid foods.  Return to urgent care or PCP if symptoms worsen or fail to resolve.    Final Clinical Impressions(s) / UC Diagnoses   Final diagnoses:  Exposure to influenza  Influenza-like symptoms  Mild intermittent asthma with acute exacerbation     Discharge Instructions      Significant exposure to influenza with symptoms consistent with influenza with asthma symptoms. We will treat with the following:  Tamiflu  75 mg twice daily for 5 days Prednisone  40 mg (2 tablets) once daily for 5 days. Take this in the morning.  This is a steroid to help with inflammation and pain.  Zofran  4 mg orally disintegrating tablet every 8 hours as needed for nausea. Benzonatate  (tessalon ) 100 mg every 8 hours as needed for cough.  Rest and stay hydrated.  Take in as much fluid as you can even if you can't eat solid foods.  Return to urgent care or PCP if symptoms worsen or fail to resolve.     ED Prescriptions     Medication Sig Dispense Auth. Provider   oseltamivir  (TAMIFLU ) 75 MG capsule Take 1 capsule (75 mg total) by mouth every 12 (twelve) hours. 10 capsule Kolsen Choe A, PA-C   predniSONE  (DELTASONE ) 20 MG tablet Take 2 tablets (40 mg total) by mouth daily with breakfast for 5 days. 10 tablet Wang Granada A, PA-C   ondansetron  (ZOFRAN -ODT) 4 MG disintegrating tablet Take 1 tablet (4 mg total) by mouth every 8 (eight) hours  as needed for nausea or vomiting. 20 tablet Landen Breeland A, PA-C   benzonatate  (TESSALON ) 100 MG capsule Take 1 capsule (100 mg total) by mouth every 8 (eight) hours. 21 capsule Teresa Almarie LABOR, [place]      PDMP not reviewed this encounter.   Teresa Almarie LABOR, [place] 03/16/23 1851

## 2023-03-16 NOTE — Discharge Instructions (Addendum)
 Significant exposure to influenza with symptoms consistent with influenza with asthma symptoms. We will treat with the following:  Tamiflu  75 mg twice daily for 5 days Prednisone  40 mg (2 tablets) once daily for 5 days. Take this in the morning.  This is a steroid to help with inflammation and pain.  Zofran  4 mg orally disintegrating tablet every 8 hours as needed for nausea. Benzonatate  (tessalon ) 100 mg every 8 hours as needed for cough.  Rest and stay hydrated.  Take in as much fluid as you can even if you can't eat solid foods.  Return to urgent care or PCP if symptoms worsen or fail to resolve.

## 2023-03-17 ENCOUNTER — Telehealth: Payer: Self-pay

## 2023-03-17 NOTE — Telephone Encounter (Signed)
 Patient wanted to get her allergy injection but she has not received one since June of last year and her vials expire 03/19/23. Patient is currently getting venom injections and fasenra  injections. Please advise if she can discontinue or needs to restart injections. Patient has an office visit on 05/27/23 with Dr. Jerelene Monday.

## 2023-03-24 NOTE — Telephone Encounter (Signed)
Can we please get patients Red vials ordered. Patient will restart at 0.05 Red and build up on schedule A.

## 2023-03-25 DIAGNOSIS — J3089 Other allergic rhinitis: Secondary | ICD-10-CM | POA: Diagnosis not present

## 2023-03-25 NOTE — Progress Notes (Signed)
VIAL ONE MADE 03-25-23.  EXP 03-24-24.

## 2023-03-26 DIAGNOSIS — J301 Allergic rhinitis due to pollen: Secondary | ICD-10-CM | POA: Diagnosis not present

## 2023-03-26 NOTE — Progress Notes (Addendum)
VIAL TWO MADE 03-26-23.  EXP 03-25-24.

## 2023-03-31 NOTE — Telephone Encounter (Signed)
 Just to be sure I order the correct allergy vials for the patient, where would you like for her to restart for allergy immunotherapy? Thank you!

## 2023-04-07 ENCOUNTER — Ambulatory Visit (INDEPENDENT_AMBULATORY_CARE_PROVIDER_SITE_OTHER): Admitting: *Deleted

## 2023-04-07 DIAGNOSIS — T63441D Toxic effect of venom of bees, accidental (unintentional), subsequent encounter: Secondary | ICD-10-CM

## 2023-04-08 ENCOUNTER — Ambulatory Visit: Payer: Medicare HMO

## 2023-04-17 ENCOUNTER — Other Ambulatory Visit: Payer: Self-pay

## 2023-04-17 ENCOUNTER — Ambulatory Visit: Admission: EM | Admit: 2023-04-17 | Discharge: 2023-04-17 | Disposition: A | Discharge status: Home / Self Care

## 2023-04-17 ENCOUNTER — Ambulatory Visit (INDEPENDENT_AMBULATORY_CARE_PROVIDER_SITE_OTHER)

## 2023-04-17 ENCOUNTER — Encounter: Payer: Self-pay | Admitting: Emergency Medicine

## 2023-04-17 DIAGNOSIS — Z86718 Personal history of other venous thrombosis and embolism: Secondary | ICD-10-CM

## 2023-04-17 DIAGNOSIS — M25572 Pain in left ankle and joints of left foot: Secondary | ICD-10-CM

## 2023-04-17 DIAGNOSIS — M79672 Pain in left foot: Secondary | ICD-10-CM | POA: Diagnosis not present

## 2023-04-17 DIAGNOSIS — M7989 Other specified soft tissue disorders: Secondary | ICD-10-CM

## 2023-04-17 DIAGNOSIS — I10 Essential (primary) hypertension: Secondary | ICD-10-CM

## 2023-04-17 MED ORDER — DICLOFENAC SODIUM 1 % EX GEL: 2.0000 g | Freq: Four times a day (QID) | CUTANEOUS | 0 refills | Status: DC

## 2023-04-17 NOTE — ED Provider Notes (Signed)
 [name]    CSN: [id number] Arrival date & time: 04/17/23  1506      History   Chief Complaint Chief Complaint  Patient presents with   Foot Pain    HPI Andrea Lowery is a 83 y.o. female.   Patient presents today with a 2-day history of left foot and ankle pain.  She reports associated swelling as well as some burning sensation at her MTP joints.  She denies any recent trauma including falls or injury.  She denies any change to her foot wear or change in her activity level.  Pain is currently rated 8 on a 0-10 pain scale, described as intense aching, no aggravating alleviating factors identified.  She has tried over-the-counter analgesics without improvement of symptoms.  She denies history of gout, rheumatoid arthritis, osteoarthritis.  She has had bunionectomy and another surgery on her metatarsal on this foot but does not currently follow with podiatry.  She does have a history of DVT many years ago but denies any significant risk factors other than age including recent immobilization, hospitalization, surgical procedure, COVID-19 diagnosis.  Denies any chest pain, shortness of breath, palpitations.  She is not chronically anticoagulated.  She denies any recent dietary or medication changes.  She is able to ambulate unassisted.  Denies any paresthesias.  Patient's blood pressure is elevated.  She has not had her medication today.  She denies any chest pain, shortness of breath, headache, vision change, dizziness.  She has plenty of her medication at home.  She is able to monitor her blood pressure at home.  She follows closely with her primary care.    Past Medical History:  Diagnosis Date   Arthritis    Asthma    Asthma 01/10/2016   Last Assessment & Plan:  After recent ED eval- currrently utilizing prednisone as prescribed. Begin Levaquin, prednisone and Albuterol MDI to be utilized as needed for acute wheezing FU in 2 day is no improvement or worsening of  symptoms. Pt verbalizes understanding for current treatment plan. Follow-up in one week. Plan to increase chronic inhaled steroid dose to assess further with prevention of   Diabetes mellitus without complication (HCC)    Diabetes II   Difficulty sleeping 12/28/2013   Last Assessment & Plan:  Stable with trazodone nightly. Continue current treatment plan. Refill trazodone today. Tolerating medication well without side effects. Return in 3 months for follow up or sooner if any worsening of symptoms.    DOE (dyspnea on exertion) 07/01/2012   DVT (deep venous thrombosis) (HCC)    "Years ago"   Dyslipidemia    Eczema    GERD (gastroesophageal reflux disease)    Headache    Migraine   Hypertension    Hypokalemia 04/30/2016   Hypothyroidism    LBBB (left bundle branch block)    MGUS (monoclonal gammopathy of unknown significance)    MGUS (monoclonal gammopathy of unknown significance)    Migraine    OSA on CPAP    Sinus infection    had sinus surgery   Urinary frequency 07/25/2021    Patient Active Problem List   Diagnosis Date Noted   Right shoulder tendinitis 06/17/2022   Seasonal allergic conjunctivitis 04/22/2022   Moderate persistent asthma 11/05/2021   Trigeminal neuralgia of left side of face 11/05/2021   Arthritis 11/05/2021   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   Vitamin D insufficiency 10/15/2021   De Quervain's tenosynovitis, left 09/21/2021   Seasonal and perennial allergic rhinitis 08/18/2019  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   Eczema 08/02/2016   Osteoporosis without current pathological fracture 05/30/2015   Chronic right-sided low back pain with right-sided sciatica 03/31/2015   Type 2 diabetes mellitus with hyperglycemia (HCC) 11/07/2014   Diastolic CHF (HCC) 11/07/2014   Sacroiliac dysfunction 11/04/2014   LPRD (laryngopharyngeal reflux disease) 10/05/2014   Hypothyroidism 09/21/2014   Thyroid nodule 07/01/2014   Moderate recurrent major depression (HCC)  03/18/2014   Healthcare maintenance 01/25/2014   Hyperlipidemia 12/27/2013   Bilateral leg edema 07/20/2013   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/2013   Essential hypertension 07/01/2012    Past Surgical History:  Procedure Laterality Date   ABDOMINAL HYSTERECTOMY  1969   ARTERY BIOPSY N/A 11/07/2014   Procedure: BIOPSY TEMPORAL ARTERY;  Surgeon: Newman Pies, MD;  Location: [facility] OR;  Service: ENT;  Laterality: N/A;   CARPAL TUNNEL RELEASE Right    CHOLECYSTECTOMY  1976   ENDOVENOUS ABLATION SAPHENOUS VEIN W/ LASER Left 08/24/2020   endovenous laser ablation left greater saphenous vein by Cari Caraway MD   LUMBAR LAMINECTOMY/DECOMPRESSION MICRODISCECTOMY N/A 07/31/2015   Procedure: L4-5 Decompression, Possible Right L4-5 Microdiscectomy;  Surgeon: Eldred Manges, MD;  Location: [facility] OR;  Service: Orthopedics;  Laterality: N/A;   NASAL SINUS SURGERY     "years ago"   NM MYOVIEW LTD  02/13/2010   No ischemia   PLANTAR FASCIA RELEASE     TONSILLECTOMY  1965   US ECHOCARDIOGRAPHY  09/20/2008   borderline LVH,mild TR,AOV mildly sclerotic w/ca+ of the leaflets    OB History   No obstetric history on file.      Home Medications    Prior to Admission medications   Medication Sig Start Date End Date Taking? Authorizing Provider  albuterol (VENTOLIN HFA) 108 (90 Base) MCG/ACT inhaler INHALE 2 PUFFS EVERY 4 HOURS AS NEEDED FOR WHEEZING OR SHORTNESS OF BREATH 08/05/22  Yes Ambs, Norvel Richards, FNP  budesonide-formoterol (SYMBICORT) 160-4.5 MCG/ACT inhaler INHALE 2 PUFFS BY MOUTH INTO THE LUNGS TWICE DAILY RINSE GARGLE AND SPIT AFTER USE 11/26/22  Yes Kozlow, Alvira Philips, MD  cetirizine (ZYRTEC) 10 MG tablet Take 1 tablet (10 mg total) by mouth daily as needed for allergies (Can take an extra dose during flare ups.). 11/26/22  Yes Kozlow, Alvira Philips, MD  diclofenac Sodium (VOLTAREN ARTHRITIS PAIN) 1 % GEL Apply 2 g topically 4 (four) times daily. 04/17/23  Yes Trendon Zaring, Noberto Retort, PA-C   esomeprazole (NEXIUM) 40 MG capsule Take 1 capsule (40 mg total) by mouth 2 (two) times daily. 11/26/22  Yes Kozlow, Alvira Philips, MD  ezetimibe (ZETIA) 10 MG tablet TAKE 1 TABLET EVERY DAY 11/11/22  Yes Dickie La, MD  fluticasone [facility]) 50 MCG/ACT nasal spray Place 2 sprays into both nostrils daily. 11/26/22  Yes Kozlow, Alvira Philips, MD  hydrALAZINE (APRESOLINE) 25 MG tablet Take 25 mg by mouth 2 (two) times daily. 03/20/23  Yes [provider]  levothyroxine (SYNTHROID) 50 MCG tablet TAKE 1 TABLET EVERY DAY, AT LEAST 30-60 MINUTES BEFORE OTHER FOOD OR MEDICINE. 08/12/22  Yes Dickie La, MD  montelukast (SINGULAIR) 10 MG tablet Take 1 tablet (10 mg total) by mouth at bedtime. 11/26/22  Yes Kozlow, Alvira Philips, MD  rosuvastatin (CRESTOR) 40 MG tablet TAKE 1 TABLET EVERY DAY (NEED MD APPOINTMENT FOR REFILLS) 11/01/22  Yes Dickie La, MD  spironolactone (ALDACTONE) 25 MG tablet Take 25 mg by mouth 2 (two) times daily. 11/01/22  Yes [provider]  spironolactone (ALDACTONE) 50 MG tablet Take  1.5 tablets (75 mg total) by mouth daily. 01/13/23  Yes Dickie La, MD  acetaminophen-codeine (TYLENOL #3) 300-30 MG tablet Take 1 tablet by mouth every 8 (eight) hours as needed. Patient not taking: Reported on 04/17/2023 03/20/23   [provider]  amLODipine-olmesartan (AZOR) 10-40 MG tablet TAKE 1 TABLET EVERY DAY Patient not taking: Reported on 04/17/2023 06/14/22   Dickie La, MD  Benralizumab (FASENRA PEN) 30 MG/ML SOAJ Inject 1 mL (30 mg total) into the skin every 8 (eight) weeks. 07/04/22   Hetty Blend, FNP  benzonatate (TESSALON) 100 MG capsule Take 1 capsule (100 mg total) by mouth every 8 (eight) hours. Patient not taking: Reported on 04/17/2023 03/16/23   Doristine Mango A, PA-C  Blood Glucose Monitoring Suppl (TRUE METRIX METER) w/Device KIT Use as instructed Patient not taking: Reported on 04/17/2023 05/22/21   Dellis Filbert, MD  busPIRone (BUSPAR) 7.5 MG tablet Take 7.5 mg by mouth 2 (two)  times daily. Patient not taking: Reported on 04/17/2023 01/10/23   [provider]  Dextrose, Diabetic Use, (RELION GLUCOSE PO) USE AS DIRECTED TO MEASURE BLOOD GLUCOSE UP TO ONE TIME DAILY 01/14/23   [provider]  DULoxetine (CYMBALTA) 30 MG capsule Take 1 capsule (30 mg total) by mouth daily. Patient not taking: Reported on 04/17/2023 08/23/22   Rana Snare, DO  empagliflozin (JARDIANCE) 25 MG TABS tablet Take 1 tablet (25 mg total) by mouth daily before breakfast. Patient not taking: Reported on 04/17/2023 09/12/22   Dickie La, MD  EPINEPHrine 0.3 mg/0.3 mL IJ SOAJ injection Inject 0.3 mg into the muscle as needed for anaphylaxis. Use as directed for severe allergic reaction 11/26/22   Kozlow, Alvira Philips, MD  glucose blood (TRUE METRIX BLOOD GLUCOSE TEST) test strip USE AS DIRECTED Patient not taking: Reported on 04/17/2023 03/19/22   Dickie La, MD  metoprolol succinate (TOPROL XL) 50 MG 24 hr tablet Take 1 tablet (50 mg total) by mouth daily. Take with or immediately following a meal. Patient not taking: Reported on 04/17/2023 07/24/21 11/26/22  Steffanie Rainwater, MD  Olopatadine-Mometasone (RYALTRIS) [phone] MCG/ACT SUSP Place 2 sprays into the nose 2 (two) times daily. Patient not taking: Reported on 04/17/2023 11/26/22   Jessica Priest, MD  [facility] injection Inject 0.5 mLs into the muscle once. Patient not taking: Reported on 04/17/2023 11/05/22   [provider]    Family History Family History  Problem Relation Age of Onset   Dementia Mother    Gallbladder disease Mother    Diabetes Father    Heart failure Father    Hypertension Father    Sleep apnea Father    Kidney disease Father    Dementia Father    Thyroid disease Sister    Hypertension Sister    Dementia Sister    Healthy Sister     Social History Social History   Tobacco Use   Smoking status: Former    Current packs/day: 0.00    Types: Cigarettes    Quit date: 02/04/2006    Years since  quitting: 17.2   Smokeless tobacco: Never  Vaping Use   Vaping status: Never Used  Substance Use Topics   Alcohol use: No    Alcohol/week: 0.0 standard drinks of alcohol   Drug use: No     Allergies   Bee venom and Penicillins   Review of Systems Review of Systems  Constitutional:  Positive for activity change. Negative for appetite change, fatigue and fever.  Respiratory:  Negative  for cough and shortness of breath.   Cardiovascular:  Positive for leg swelling. Negative for chest pain and palpitations.  Musculoskeletal:  Positive for arthralgias, gait problem and joint swelling. Negative for myalgias.  Skin:  Negative for color change and wound.  Neurological:  Negative for weakness.     Physical Exam Triage Vital Signs ED Triage Vitals  Encounter Vitals Group     BP 04/17/23 1654 (!) 187/92     Systolic BP Percentile --      Diastolic BP Percentile --      Pulse Rate 04/17/23 1654 75     Resp 04/17/23 1654 16     Temp 04/17/23 1654 97.8 F (36.6 C)     Temp Source 04/17/23 1654 Oral     SpO2 04/17/23 1654 98 %     Weight --      Height --      Head Circumference --      Peak Flow --      Pain Score 04/17/23 1655 8     Pain Loc --      Pain Education --      Exclude from Growth Chart --    No data found.  Updated Vital Signs BP (!) 187/92 (BP Location: Left Arm)   Pulse 75   Temp 97.8 F (36.6 C) (Oral)   Resp 16   SpO2 98%   Visual Acuity Right Eye Distance:   Left Eye Distance:   Bilateral Distance:    Right Eye Near:   Left Eye Near:    Bilateral Near:     Physical Exam Vitals reviewed.  Constitutional:      General: She is awake. She is not in acute distress.    Appearance: Normal appearance. She is well-developed. She is not ill-appearing.     Comments: Very pleasant female appears stated age in no acute distress sitting comfortably in exam room  HENT:     Head: Normocephalic and atraumatic.  Cardiovascular:     Rate and Rhythm: Normal  rate and regular rhythm.     Heart sounds: Normal heart sounds, S1 normal and S2 normal. No murmur heard.    Comments: 2+ pitting edema to ankle on the left.  Capillary fill within 2 seconds left toes. Pulmonary:     Effort: Pulmonary effort is normal.     Breath sounds: Normal breath sounds. No wheezing, rhonchi or rales.     Comments: Clear to auscultation bilaterally Musculoskeletal:     Left ankle: Swelling present. Tenderness present over the lateral malleolus. Decreased range of motion.     Left Achilles Tendon: No tenderness. Thompson's test negative.     Left foot: Normal range of motion and normal capillary refill. Swelling and tenderness present. No bunion or bony tenderness.     Comments: Left ankle/foot: Swelling and tenderness at the lateral left ankle.  Tenderness along dorsal MTP joints without deformity.  Normal active range of motion of toes.  Foot is neurovascularly intact.  Several well-healed surgical scars over first and third metatarsal joints.  Strength 5/5 at ankle.  Psychiatric:        Behavior: Behavior is cooperative.      UC Treatments / Results  Labs (all labs ordered are listed, but only abnormal results are displayed) Labs Reviewed - No data to display  EKG   Radiology No results found.  Procedures Procedures (including critical care time)  Medications Ordered in UC Medications - No data to display  Initial  Impression / Assessment and Plan / UC Course  I have reviewed the triage vital signs and the nursing notes.  Pertinent labs & imaging results that were available during my care of the patient were reviewed by me and considered in my medical decision making (see chart for details).     Patient is well-appearing, afebrile, nontoxic, nontachycardic.  X-rays of foot and ankle were obtained that showed no acute osseous abnormality based on my primary read.  At the time of discharge we were waiting for radiologist over read and we will contact her  if this differs and changes our treatment plan.  She does have sudden onset of unilateral swelling on the left which is abnormal with a history of DVT so we will obtain outpatient ultrasound though low suspicion for DVT as etiology of symptoms.  We will contact her if this is abnormal.  She was given Voltaren to help manage pain and recommend that she use over-the-counter analgesics such as Tylenol.  She is to avoid systemic NSAIDs due to history of elevated blood pressure.  She was placed in Ace bandage encouraged use the RICE protocol.  Recommend close follow-up with orthopedics if her symptoms are not improving and all of her testing is negative.  We discussed that if anything worsens or changes and she has increasing swelling, pain, fever, redness, numbness or tingling in her foot she needs to go to the emergency room.  Strict return precautions given.  Her blood pressure is very elevated but she denies any signs/symptoms of endorgan damage.  She did not take her blood pressure medication today and will go home and take this.  Recommend that she monitor her blood pressure at home and keep a log for evaluation follow-up appointment.  She is to follow-up with her primary care.  We discussed that if she develops any chest pain, shortness of breath, headache, vision change, dizziness in setting of high blood pressure she needs to go to the emergency room.  Strict return precautions given to which she expressed understanding.  Final Clinical Impressions(s) / UC Diagnoses   Final diagnoses:  Left foot pain  Acute left ankle pain  Left leg swelling  History of DVT (deep vein thrombosis)  Elevated blood pressure reading with diagnosis of hypertension     Discharge Instructions      Please keep your leg elevated.  Use the Ace bandage to help with the support and swelling.  I will contact you if radiologist sees anything on your x-rays that changes our treatment plan.  Go get the ultrasound tomorrow to  make sure that there is no blood clot.  If anything worsens and you are leg becomes more swollen, more painful, red or you develop any chest pain, shortness of breath, heart racing you need to go to the emergency room.  Use Voltaren up to 4 times a day to help with pain.  You can also use over-the-counter Tylenol.  Follow-up with orthopedics if your symptoms are not improving.  If anything worsens and you have increasing pain, swelling, fever, redness, nausea/vomiting you need to be seen immediately.     ED Prescriptions     Medication Sig Dispense Auth. Provider   diclofenac Sodium (VOLTAREN ARTHRITIS PAIN) 1 % GEL Apply 2 g topically 4 (four) times daily. 100 g Stevin Bielinski K, PA-C      PDMP not reviewed this encounter.   Jeani Hawking, PA-C 04/17/23 2112

## 2023-04-17 NOTE — Discharge Instructions (Signed)
 Please keep your leg elevated.  Use the Ace bandage to help with the support and swelling.  I will contact you if radiologist sees anything on your x-rays that changes our treatment plan.  Go get the ultrasound tomorrow to make sure that there is no blood clot.  If anything worsens and you are leg becomes more swollen, more painful, red or you develop any chest pain, shortness of breath, heart racing you need to go to the emergency room.  Use Voltaren up to 4 times a day to help with pain.  You can also use over-the-counter Tylenol.  Follow-up with orthopedics if your symptoms are not improving.  If anything worsens and you have increasing pain, swelling, fever, redness, nausea/vomiting you need to be seen immediately.

## 2023-04-17 NOTE — ED Triage Notes (Signed)
 Pt reports L ankle & foot pain and swelling x2 days. Pt has baseline swelling in R foot and ankle, but states L side usually doesn't give her any trouble. +3 pitting edema to area, but rebounds quickly. No relief with rest, elevation, and compression. No injury or trauma to area. Reduced ROM due to pain and swelling. Hx of DVT, HTN, and T2DM. BP is high in triage, but pt reports forgetting to take her medications today.

## 2023-04-21 ENCOUNTER — Ambulatory Visit (HOSPITAL_COMMUNITY): Attending: Surgery

## 2023-05-05 ENCOUNTER — Ambulatory Visit (INDEPENDENT_AMBULATORY_CARE_PROVIDER_SITE_OTHER): Payer: Medicare HMO | Admitting: *Deleted

## 2023-05-05 DIAGNOSIS — J4550 Severe persistent asthma, uncomplicated: Secondary | ICD-10-CM

## 2023-05-07 ENCOUNTER — Ambulatory Visit (INDEPENDENT_AMBULATORY_CARE_PROVIDER_SITE_OTHER): Payer: Self-pay | Admitting: *Deleted

## 2023-05-07 DIAGNOSIS — J309 Allergic rhinitis, unspecified: Secondary | ICD-10-CM

## 2023-05-15 ENCOUNTER — Ambulatory Visit (INDEPENDENT_AMBULATORY_CARE_PROVIDER_SITE_OTHER): Payer: Self-pay

## 2023-05-15 DIAGNOSIS — J309 Allergic rhinitis, unspecified: Secondary | ICD-10-CM | POA: Diagnosis not present

## 2023-05-20 ENCOUNTER — Ambulatory Visit (INDEPENDENT_AMBULATORY_CARE_PROVIDER_SITE_OTHER): Payer: Self-pay

## 2023-05-20 DIAGNOSIS — J309 Allergic rhinitis, unspecified: Secondary | ICD-10-CM

## 2023-05-27 ENCOUNTER — Encounter: Payer: Self-pay | Admitting: Allergy and Immunology

## 2023-05-27 ENCOUNTER — Other Ambulatory Visit: Payer: Self-pay

## 2023-05-27 ENCOUNTER — Ambulatory Visit: Payer: Medicare HMO | Admitting: Allergy and Immunology

## 2023-05-27 VITALS — BP 160/96 | HR 88 | Temp 97.6°F | Resp 18 | Ht 65.0 in | Wt 184.1 lb

## 2023-05-27 DIAGNOSIS — K219 Gastro-esophageal reflux disease without esophagitis: Secondary | ICD-10-CM

## 2023-05-27 DIAGNOSIS — I10 Essential (primary) hypertension: Secondary | ICD-10-CM

## 2023-05-27 DIAGNOSIS — H1010 Acute atopic conjunctivitis, unspecified eye: Secondary | ICD-10-CM

## 2023-05-27 DIAGNOSIS — J4540 Moderate persistent asthma, uncomplicated: Secondary | ICD-10-CM | POA: Diagnosis not present

## 2023-05-27 DIAGNOSIS — H1013 Acute atopic conjunctivitis, bilateral: Secondary | ICD-10-CM | POA: Diagnosis not present

## 2023-05-27 DIAGNOSIS — J301 Allergic rhinitis due to pollen: Secondary | ICD-10-CM | POA: Diagnosis not present

## 2023-05-27 DIAGNOSIS — J309 Allergic rhinitis, unspecified: Secondary | ICD-10-CM

## 2023-05-27 DIAGNOSIS — R600 Localized edema: Secondary | ICD-10-CM

## 2023-05-27 DIAGNOSIS — L989 Disorder of the skin and subcutaneous tissue, unspecified: Secondary | ICD-10-CM

## 2023-05-27 DIAGNOSIS — E8779 Other fluid overload: Secondary | ICD-10-CM

## 2023-05-27 MED ORDER — BUDESONIDE-FORMOTEROL FUMARATE 160-4.5 MCG/ACT IN AERO: INHALATION_SPRAY | RESPIRATORY_TRACT | 5 refills | Status: DC

## 2023-05-27 MED ORDER — MONTELUKAST SODIUM 10 MG PO TABS: 10.0000 mg | ORAL_TABLET | Freq: Every evening | ORAL | 1 refills | Status: DC

## 2023-05-27 MED ORDER — FLUTICASONE PROPIONATE 50 MCG/ACT NA SUSP: 2.0000 | Freq: Every day | NASAL | 1 refills | Status: DC

## 2023-05-27 MED ORDER — ESOMEPRAZOLE MAGNESIUM 40 MG PO CPDR: 40.0000 mg | DELAYED_RELEASE_CAPSULE | Freq: Two times a day (BID) | ORAL | 1 refills | Status: DC

## 2023-05-27 MED ORDER — EPINEPHRINE 0.3 MG/0.3ML IJ SOAJ: 0.3000 mg | INTRAMUSCULAR | 1 refills | Status: AC | PRN

## 2023-05-27 NOTE — Patient Instructions (Addendum)
  1. Continue Fluticasone  1-2 sprays each nostril 1-2 times per day  2. Continue montelukast  10mg  - 1 tablet 1 time per day  3. Continue esomeprazole  40 mg - 1 tablet 1 time per day  4. Continue immunotherapy for venoms and aeroallergens  5. Continue benralizumab  injections every 8 weeks  6. RESTART Symbicort  - 2 inhalations 2 times per day w/ spacer (empty lungs)  6. If Needed:     A. nasal saline multiple times per day   B. OTC mucinex  DM twice a day   C. Cetirizine  10mg  one time per day  D. DUONEB every 4-6 hours  E. EpiPen   F. Symbicort  - 2 inhalations every 6 hours  G. Derm smooth scalp oil -apply 1-7 times a week  7. Contact primary doctor: need a new plan for blood pressure.   8. Obtain a ECHO for fluid overload  9. Influenza = Tamiflu . Covid = Paxlovid  10. Return to clinic in 6 months or earlier if problem

## 2023-05-27 NOTE — Progress Notes (Signed)
 [place] [facility] [place] [facility] [place] [facility] [place]   Follow-up Note  Referring Provider: Bevelyn Bryant, MD Primary Provider: Ulysees Gander, MD Date of Office Visit: 05/27/2023  Subjective:   Andrea Lowery (DOB: 21-Oct-1940) is a 83 y.o. female who [facility] in re-evaluation of the following:  HPI: [name] returns to this clinic in evaluation of asthma, allergic rhinitis, reflux, hymenoptera venom hypersensitivity state, MGUS.  Last saw her in this clinic 26 November 2022.  She has had a little bit of intermittent cough and she has been using her Symbicort  about 1 time per day while she continues on benralizumab  injections.  She has had some intermittent nasal congestion although for the most part her nose is doing well using montelukast .  She continues on immunotherapy directed against aeroallergens every 4 weeks without any without adverse effect.  She has been having a headache ann her blood pressure is out of control.  She is having manipulation of her blood pressure medications but her current plan of what sounds like hydralazine  and spironolactone  does not appear to be resulting in good control of her hypertension.  She has been having lots of leg swelling and her amlodipine  was removed last year yet she still has lots of legs welling.  She has had no problems with her reflux.  In addition to receiving immunotherapy for aeroallergen atopic disease she is also receiving immunotherapy directed against hymenoptera venom hypersensitivity currently at every 8 weeks without any adverse effect.  She has been developing an itchy scaly scalp on occasion.  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  Allergies as of 05/27/2023       Reactions   Bee Venom Anaphylaxis   Yellow jackets Yellow jackets   Penicillins Hives   Has patient had a PCN reaction causing immediate rash, facial/tongue/throat swelling, SOB or lightheadedness with  hypotension: Yes Has patient had a PCN reaction causing severe rash involving mucus membranes or skin necrosis: No Has patient had a PCN reaction that required hospitalization No Has patient had a PCN reaction occurring within the last 10 years: No If all of the above answers are "NO", then may proceed with Cephalosporin use.        Medication List    acetaminophen -codeine  300-30 MG tablet Commonly known as: TYLENOL  #3 Take 1 tablet by mouth every 8 (eight) hours as needed.   albuterol  108 (90 Base) MCG/ACT inhaler Commonly known as: VENTOLIN  HFA INHALE 2 PUFFS EVERY 4 HOURS AS NEEDED FOR WHEEZING OR SHORTNESS OF BREATH   amLODipine -olmesartan  10-40 MG tablet Commonly known as: AZOR  TAKE 1 TABLET EVERY DAY   benzonatate  100 MG capsule Commonly known as: TESSALON  Take 1 capsule (100 mg total) by mouth every 8 (eight) hours.   budesonide -formoterol  160-4.5 MCG/ACT inhaler Commonly known as: Symbicort  INHALE 2 PUFFS BY MOUTH INTO THE LUNGS TWICE DAILY RINSE GARGLE AND SPIT AFTER USE   busPIRone 7.5 MG tablet Commonly known as: BUSPAR Take 7.5 mg by mouth 2 (two) times daily.   cetirizine  10 MG tablet Commonly known as: ZYRTEC  Take 1 tablet (10 mg total) by mouth daily as needed for allergies (Can take an extra dose during flare ups.).   diclofenac  Sodium 1 % Gel Commonly known as: Voltaren  Arthritis Pain Apply 2 g topically 4 (four) times daily.   DULoxetine  30 MG capsule Commonly known as: Cymbalta  Take 1 capsule (30 mg total) by mouth daily.   empagliflozin  25  MG Tabs tablet Commonly known as: Jardiance  Take 1 tablet (25 mg total) by mouth daily before breakfast.   EPINEPHrine  0.3 mg/0.3 mL Soaj injection Commonly known as: EPI-PEN Inject 0.3 mg into the muscle as needed for anaphylaxis. Use as directed for severe allergic reaction   esomeprazole  40 MG capsule Commonly known as: NEXIUM  Take 1 capsule (40 mg total) by mouth 2 (two) times daily.   ezetimibe  10  MG tablet Commonly known as: ZETIA  TAKE 1 TABLET EVERY DAY   Fasenra  Pen 30 MG/ML prefilled autoinjector Generic drug: benralizumab  Inject 1 mL (30 mg total) into the skin every 8 (eight) weeks.   fluticasone  50 MCG/ACT nasal spray Commonly known as: FLONASE  Place 2 sprays into both nostrils daily.   hydrALAZINE  25 MG tablet Commonly known as: APRESOLINE  Take 25 mg by mouth 2 (two) times daily.   levothyroxine  50 MCG tablet Commonly known as: SYNTHROID  TAKE 1 TABLET EVERY DAY, AT LEAST 30-60 MINUTES BEFORE OTHER FOOD OR MEDICINE.   metoprolol  succinate 50 MG 24 hr tablet Commonly known as: Toprol  XL Take 1 tablet (50 mg total) by mouth daily. Take with or immediately following a meal.   montelukast  10 MG tablet Commonly known as: SINGULAIR  Take 1 tablet (10 mg total) by mouth at bedtime.   RELION GLUCOSE PO USE AS DIRECTED TO MEASURE BLOOD GLUCOSE UP TO ONE TIME DAILY   rosuvastatin  40 MG tablet Commonly known as: CRESTOR  TAKE 1 TABLET EVERY DAY (NEED MD APPOINTMENT FOR REFILLS)   Ryaltris  665-25 MCG/ACT Susp Generic drug: Olopatadine -Mometasone  Place 2 sprays into the nose 2 (two) times daily.   Shingrix injection Generic drug: Zoster Vaccine Adjuvanted Inject 0.5 mLs into the muscle once.   spironolactone  25 MG tablet Commonly known as: ALDACTONE  Take 25 mg by mouth 2 (two) times daily.   spironolactone  50 MG tablet Commonly known as: ALDACTONE  Take 1.5 tablets (75 mg total) by mouth daily.   True Metrix Blood Glucose Test test strip Generic drug: glucose blood USE AS DIRECTED   True Metrix Meter w/Device Kit Use as instructed    Past Medical History:  Diagnosis Date   Arthritis    Asthma    Asthma 01/10/2016   Last Assessment & Plan:  After recent ED eval- currrently utilizing prednisone  as prescribed. Begin Levaquin , prednisone  and Albuterol  MDI to be utilized as needed for acute wheezing FU in 2 day is no improvement or worsening of symptoms. Pt  verbalizes understanding for current treatment plan. Follow-up in one week. Plan to increase chronic inhaled steroid dose to assess further with prevention of   Diabetes mellitus without complication (HCC)    Diabetes II   Difficulty sleeping 12/28/2013   Last Assessment & Plan:  Stable with trazodone  nightly. Continue current treatment plan. Refill trazodone  today. Tolerating medication well without side effects. Return in 3 months for follow up or sooner if any worsening of symptoms.    DOE (dyspnea on exertion) 07/01/2012   DVT (deep venous thrombosis) (HCC)    "Years ago"   Dyslipidemia    Eczema    GERD (gastroesophageal reflux disease)    Headache    Migraine   Hypertension    Hypokalemia 04/30/2016   Hypothyroidism    LBBB (left bundle branch block)    MGUS (monoclonal gammopathy of unknown significance)    MGUS (monoclonal gammopathy of unknown significance)    Migraine    OSA on CPAP    Sinus infection    had sinus surgery  Urinary frequency 07/25/2021    Past Surgical History:  Procedure Laterality Date   ABDOMINAL HYSTERECTOMY  1969   ARTERY BIOPSY N/A 11/07/2014   Procedure: BIOPSY TEMPORAL ARTERY;  Surgeon: Reynold Caves, MD;  Location: [facility] OR;  Service: ENT;  Laterality: N/A;   CARPAL TUNNEL RELEASE Right    CHOLECYSTECTOMY  1976   ENDOVENOUS ABLATION SAPHENOUS VEIN W/ LASER Left 08/24/2020   endovenous laser ablation left greater saphenous vein by Kirtland Perfect MD   LUMBAR LAMINECTOMY/DECOMPRESSION MICRODISCECTOMY N/A 07/31/2015   Procedure: L4-5 Decompression, Possible Right L4-5 Microdiscectomy;  Surgeon: Adah Acron, MD;  Location: [facility] OR;  Service: Orthopedics;  Laterality: N/A;   NASAL SINUS SURGERY     "years ago"   NM MYOVIEW  LTD  02/13/2010   No ischemia   PLANTAR FASCIA RELEASE     TONSILLECTOMY  1965   US  ECHOCARDIOGRAPHY  09/20/2008   borderline LVH,mild TR,AOV mildly sclerotic w/ca+ of the leaflets    Review of systems negative except as noted in  HPI / PMHx or noted below:  Review of Systems  Constitutional: Negative.   HENT: Negative.    Eyes: Negative.   Respiratory: Negative.    Cardiovascular: Negative.   Gastrointestinal: Negative.   Genitourinary: Negative.   Musculoskeletal: Negative.   Skin: Negative.   Neurological: Negative.   Endo/Heme/Allergies: Negative.   Psychiatric/Behavioral: Negative.       Objective:   Vitals:   05/27/23 1338 05/27/23 1420  BP: (!) 160/92 (!) 160/96  Pulse: 88   Resp: 18   Temp: 97.6 F (36.4 C)   SpO2: 98%    Height: 5\' 5"  (165.1 cm)  Weight: 184 lb 1.6 oz (83.5 kg)   Physical Exam Constitutional:      Appearance: She is not diaphoretic.  HENT:     Head: Normocephalic.     Right Ear: Tympanic membrane, ear canal and external ear normal.     Left Ear: Tympanic membrane, ear canal and external ear normal.     Nose: Nose normal. No mucosal edema or rhinorrhea.     Mouth/Throat:     Pharynx: Uvula midline. No oropharyngeal exudate.  Eyes:     Conjunctiva/sclera: Conjunctivae normal.  Neck:     Thyroid : No thyromegaly.     Trachea: Trachea normal. No tracheal tenderness or tracheal deviation.  Cardiovascular:     Rate and Rhythm: Normal rate and regular rhythm.     Heart sounds: Normal heart sounds, S1 normal and S2 normal. No murmur heard. Pulmonary:     Effort: No respiratory distress.     Breath sounds: Normal breath sounds. No stridor. No wheezing or rales.  Musculoskeletal:        General: Swelling (lower extremity pitting edema) present.  Lymphadenopathy:     Head:     Right side of head: No tonsillar adenopathy.     Left side of head: No tonsillar adenopathy.     Cervical: No cervical adenopathy.  Skin:    Findings: No erythema or rash.     Nails: There is no clubbing.  Neurological:     Mental Status: She is alert.     Diagnostics: none  Assessment and Plan:   1. Not well controlled moderate persistent asthma   2. Seasonal allergic rhinitis due to  pollen   3. Seasonal allergic conjunctivitis   4. LPRD (laryngopharyngeal reflux disease)   5. Essential hypertension   6. Other hypervolemia   7. Inflammatory dermatosis   8. Lower extremity  edema   9. Allergic rhinitis, unspecified seasonality, unspecified trigger    1. Continue Fluticasone  1-2 sprays each nostril 1-2 times per day  2. Continue montelukast  10mg  - 1 tablet 1 time per day  3. Continue esomeprazole  40 mg - 1 tablet 1 time per day  4. Continue immunotherapy for venoms and aeroallergens  5. Continue benralizumab  injections every 8 weeks  6. RESTART Symbicort  - 2 inhalations 2 times per day w/ spacer (empty lungs)  6. If Needed:     A. nasal saline multiple times per day   B. OTC mucinex  DM twice a day   C. Cetirizine  10mg  one time per day  D. DUONEB every 4-6 hours  E. EpiPen   F. Symbicort  - 2 inhalations every 6 hours  G. Derm smooth scalp oil -apply 1-7 times a week  7. Contact primary doctor: need a new plan for blood pressure.   8. Obtain a ECHO for fluid overload  9. Influenza = Tamiflu . Covid = Paxlovid  10. Return to clinic in 6 months or earlier if problem  Overall Pat's airway is doing relatively well although she could stand to use a little bit more Symbicort  on a regular basis and maybe we can get her to use this agent twice a day on a consistent basis and see how that works for her regarding her remaining respiratory tract symptoms while she also continues on other anti-inflammatory medications including anti-IL-5 biologic agent.  And her reflux appears to be under pretty good control while using her proton pump inhibitor.  Her big problems at this point in time appear to be her hypertension and lower extremity edema.  I have encouraged her to contact her primary care doctor today and so she can notify them that the use of hydralazine  and spironolactone  is not controlling her blood pressure and we will order an echocardiogram to make sure she does not  have some cardiac source giving rise to her lower extremity edema.  She also appears to have some problems with itchy scaly scalp and she can try Derma-Smoothe  scalp oil.  Assuming she does well with this plan I will see her back in this clinic in 6 months or earlier if there is a problem.  Schuyler Custard, MD Allergy / Immunology [facility]

## 2023-05-28 ENCOUNTER — Encounter: Payer: Self-pay | Admitting: Allergy and Immunology

## 2023-05-28 MED ORDER — FLUOCINOLONE ACETONIDE SCALP 0.01 % EX OIL: TOPICAL_OIL | CUTANEOUS | 5 refills | Status: DC

## 2023-05-28 NOTE — Addendum Note (Signed)
 Addended by: Jackqulyn Masse on: 05/28/2023 06:04 PM   Modules accepted: Orders

## 2023-05-28 NOTE — Addendum Note (Signed)
 Addended by: Denton Flakes on: 05/28/2023 02:50 PM   Modules accepted: Orders

## 2023-05-29 ENCOUNTER — Other Ambulatory Visit: Payer: Self-pay | Admitting: Family Medicine

## 2023-05-29 DIAGNOSIS — K7460 Unspecified cirrhosis of liver: Secondary | ICD-10-CM

## 2023-05-29 NOTE — Addendum Note (Signed)
 Addended by: Jackqulyn Masse on: 05/29/2023 09:57 AM   Modules accepted: Orders

## 2023-06-02 ENCOUNTER — Ambulatory Visit (INDEPENDENT_AMBULATORY_CARE_PROVIDER_SITE_OTHER)

## 2023-06-02 DIAGNOSIS — T63441D Toxic effect of venom of bees, accidental (unintentional), subsequent encounter: Secondary | ICD-10-CM | POA: Diagnosis not present

## 2023-06-04 ENCOUNTER — Other Ambulatory Visit

## 2023-06-05 ENCOUNTER — Other Ambulatory Visit

## 2023-06-10 ENCOUNTER — Telehealth: Payer: Self-pay

## 2023-06-10 NOTE — Telephone Encounter (Signed)
 No further action needed by [facility],  Transferred care.  Please refer to pcp name documented on chart.  Copied from CRM [id number]. Topic: Clinical - Prescription Issue >> Jun 10, 2023  1:05 PM Tiffany H wrote: Reason for CRM: Amber with [facility] pharmacy called to request PA for Benralizumab  (FASENRA  PEN) 30 MG/ML SOAJ [[id number]]. Pin for CoverMyMeds expired. PA wiill require calling plan directly at:   Phone: [phone]

## 2023-06-11 NOTE — Telephone Encounter (Signed)
 Thanks [employer]

## 2023-06-11 NOTE — Telephone Encounter (Signed)
 NOT [facility] PATIENT

## 2023-06-13 ENCOUNTER — Ambulatory Visit
Admission: RE | Admit: 2023-06-13 | Discharge: 2023-06-13 | Disposition: A | Discharge status: Home / Self Care | Source: Ambulatory Visit | Attending: Family Medicine | Admitting: Family Medicine

## 2023-06-13 DIAGNOSIS — K7460 Unspecified cirrhosis of liver: Secondary | ICD-10-CM

## 2023-06-24 ENCOUNTER — Ambulatory Visit (INDEPENDENT_AMBULATORY_CARE_PROVIDER_SITE_OTHER)

## 2023-06-24 DIAGNOSIS — R600 Localized edema: Secondary | ICD-10-CM

## 2023-06-24 DIAGNOSIS — I10 Essential (primary) hypertension: Secondary | ICD-10-CM

## 2023-06-26 LAB — ECHOCARDIOGRAM COMPLETE
AR max vel: 1.72 cm2
AV Area VTI: 1.68 cm2
AV Area mean vel: 1.5 cm2
AV Mean grad: 5.4 mmHg
AV Peak grad: 9 mmHg
Ao pk vel: 1.5 m/s
Area-P 1/2: 3.77 cm2
Est EF: 50
S' Lateral: 2.63 cm

## 2023-06-30 ENCOUNTER — Ambulatory Visit
Admission: EM | Admit: 2023-06-30 | Discharge: 2023-06-30 | Disposition: A | Discharge status: Home / Self Care | Attending: Physician Assistant | Admitting: Physician Assistant

## 2023-06-30 DIAGNOSIS — E1165 Type 2 diabetes mellitus with hyperglycemia: Secondary | ICD-10-CM | POA: Diagnosis not present

## 2023-06-30 DIAGNOSIS — J45901 Unspecified asthma with (acute) exacerbation: Secondary | ICD-10-CM

## 2023-06-30 MED ORDER — PREDNISONE 20 MG PO TABS: 20.0000 mg | ORAL_TABLET | Freq: Every day | ORAL | 0 refills | Status: AC

## 2023-06-30 MED ORDER — IPRATROPIUM-ALBUTEROL 0.5-2.5 (3) MG/3ML IN SOLN
3.0000 mL | Freq: Once | RESPIRATORY_TRACT | Status: AC
  Administered 2023-06-30: 3 mL via RESPIRATORY_TRACT

## 2023-06-30 NOTE — ED Triage Notes (Signed)
 Patient presents with cough and wheezing that started 3 days ago. Patient is coughing up mucous.

## 2023-06-30 NOTE — ED Provider Notes (Signed)
 [name]    CSN: [id number] Arrival date & time: 06/30/23  1012      History   Chief Complaint Chief Complaint  Patient presents with   Cough   Wheezing    HPI Andrea Lowery is a 83 y.o. female.   Patient here today for evaluation of cough and wheezing that started about 3 days ago.  She reports that she has been congested and has been coughing up mucus.  She has tried her albuterol  as well as other medications without improvement.  She denies any fever.  The history is provided by the patient.  Cough Associated symptoms: sore throat and wheezing   Associated symptoms: no chills, no ear pain, no eye discharge, no fever and no shortness of breath   Wheezing Associated symptoms: cough and sore throat   Associated symptoms: no ear pain, no fever and no shortness of breath     Past Medical History:  Diagnosis Date   Arthritis    Asthma    Asthma 01/10/2016   Last Assessment & Plan:  After recent ED eval- currrently utilizing prednisone  as prescribed. Begin Levaquin , prednisone  and Albuterol  MDI to be utilized as needed for acute wheezing FU in 2 day is no improvement or worsening of symptoms. Pt verbalizes understanding for current treatment plan. Follow-up in one week. Plan to increase chronic inhaled steroid dose to assess further with prevention of   Diabetes mellitus without complication (HCC)    Diabetes II   Difficulty sleeping 12/28/2013   Last Assessment & Plan:  Stable with trazodone  nightly. Continue current treatment plan. Refill trazodone  today. Tolerating medication well without side effects. Return in 3 months for follow up or sooner if any worsening of symptoms.    DOE (dyspnea on exertion) 07/01/2012   DVT (deep venous thrombosis) (HCC)    "Years ago"   Dyslipidemia    Eczema    GERD (gastroesophageal reflux disease)    Headache    Migraine   Hypertension    Hypokalemia 04/30/2016   Hypothyroidism    LBBB (left bundle branch block)     MGUS (monoclonal gammopathy of unknown significance)    MGUS (monoclonal gammopathy of unknown significance)    Migraine    OSA on CPAP    Sinus infection    had sinus surgery   Urinary frequency 07/25/2021    Patient Active Problem List   Diagnosis Date Noted   Right shoulder tendinitis 06/17/2022   Seasonal allergic conjunctivitis 04/22/2022   Moderate persistent asthma 11/05/2021   Trigeminal neuralgia of left side of face 11/05/2021   Arthritis 11/05/2021   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   Vitamin D  insufficiency 10/15/2021   De Quervain's tenosynovitis, left 09/21/2021   Seasonal and perennial allergic rhinitis 08/18/2019   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   Eczema 08/02/2016   Osteoporosis without current pathological fracture 05/30/2015   Chronic right-sided low back pain with right-sided sciatica 03/31/2015   Type 2 diabetes mellitus with hyperglycemia (HCC) 11/07/2014   Diastolic CHF (HCC) 11/07/2014   Sacroiliac dysfunction 11/04/2014   LPRD (laryngopharyngeal reflux disease) 10/05/2014   Hypothyroidism 09/21/2014   Thyroid  nodule 07/01/2014   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/18/2014   Healthcare maintenance 01/25/2014   Hyperlipidemia 12/27/2013   Bilateral leg edema 07/20/2013   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/2013   Essential hypertension 07/01/2012    Past Surgical History:  Procedure Laterality Date   ABDOMINAL HYSTERECTOMY  1969   ARTERY BIOPSY N/A 11/07/2014  Procedure: BIOPSY TEMPORAL ARTERY;  Surgeon: Reynold Caves, MD;  Location: [facility] OR;  Service: ENT;  Laterality: N/A;   CARPAL TUNNEL RELEASE Right    CHOLECYSTECTOMY  1976   ENDOVENOUS ABLATION SAPHENOUS VEIN W/ LASER Left 08/24/2020   endovenous laser ablation left greater saphenous vein by Kirtland Perfect MD   LUMBAR LAMINECTOMY/DECOMPRESSION MICRODISCECTOMY N/A 07/31/2015   Procedure: L4-5 Decompression, Possible Right L4-5 Microdiscectomy;  Surgeon: Adah Acron, MD;  Location: [facility] OR;  Service: Orthopedics;  Laterality: N/A;   NASAL SINUS SURGERY     "years ago"   NM MYOVIEW  LTD  02/13/2010   No ischemia   PLANTAR FASCIA RELEASE     TONSILLECTOMY  1965   US  ECHOCARDIOGRAPHY  09/20/2008   borderline LVH,mild TR,AOV mildly sclerotic w/ca+ of the leaflets    OB History   No obstetric history on file.      Home Medications    Prior to Admission medications   Medication Sig Start Date End Date Taking? Authorizing Provider  predniSONE  (DELTASONE ) 20 MG tablet Take 1 tablet (20 mg total) by mouth daily with breakfast for 5 days. 06/30/23 07/05/23 Yes Vernestine Gondola, PA-C  acetaminophen -codeine  (TYLENOL  #3) 300-30 MG tablet Take 1 tablet by mouth every 8 (eight) hours as needed. 03/20/23  Yes [provider]  albuterol  (VENTOLIN  HFA) 108 (90 Base) MCG/ACT inhaler INHALE 2 PUFFS EVERY 4 HOURS AS NEEDED FOR WHEEZING OR SHORTNESS OF BREATH 08/05/22  Yes Ambs, Jeanmarie Millet, FNP  amLODipine -olmesartan  (AZOR ) 10-40 MG tablet TAKE 1 TABLET EVERY DAY 06/14/22  Yes Bevelyn Bryant, MD  Benralizumab  (FASENRA  PEN) 30 MG/ML SOAJ Inject 1 mL (30 mg total) into the skin every 8 (eight) weeks. 07/04/22  Yes Ambs, Jeanmarie Millet, FNP  benzonatate  (TESSALON ) 100 MG capsule Take 1 capsule (100 mg total) by mouth every 8 (eight) hours. 03/16/23  Yes White, Elizabeth A, PA-C  Blood Glucose Monitoring Suppl (TRUE METRIX METER) w/Device KIT Use as instructed 05/22/21  Yes Ariwodo, Shirley, MD  budesonide -formoterol  (SYMBICORT ) 160-4.5 MCG/ACT inhaler INHALE 2 PUFFS BY MOUTH INTO THE LUNGS TWICE DAILY RINSE GARGLE AND SPIT AFTER USE 11/26/22  Yes Kozlow, Rema Care, MD  budesonide -formoterol  (SYMBICORT ) 160-4.5 MCG/ACT inhaler 2 inhalations 2 times per day w/ spacer 05/27/23   Kozlow, Rema Care, MD  busPIRone (BUSPAR) 7.5 MG tablet Take 7.5 mg by mouth 2 (two) times daily. 01/10/23  Yes [provider]  cetirizine  (ZYRTEC ) 10 MG tablet Take 1 tablet (10 mg total) by mouth daily as  needed for allergies (Can take an extra dose during flare ups.). 11/26/22  Yes Kozlow, Rema Care, MD  Dextrose , Diabetic Use, (RELION GLUCOSE PO) USE AS DIRECTED TO MEASURE BLOOD GLUCOSE UP TO ONE TIME DAILY 01/14/23  Yes [provider]  diclofenac  Sodium (VOLTAREN  ARTHRITIS PAIN) 1 % GEL Apply 2 g topically 4 (four) times daily. 04/17/23  Yes Raspet, Erin K, PA-C  DULoxetine  (CYMBALTA ) 30 MG capsule Take 1 capsule (30 mg total) by mouth daily. 08/23/22  Yes Zheng, Michael, DO  empagliflozin  (JARDIANCE ) 25 MG TABS tablet Take 1 tablet (25 mg total) by mouth daily before breakfast. 09/12/22  Yes Bevelyn Bryant, MD  EPINEPHrine  0.3 mg/0.3 mL IJ SOAJ injection Inject 0.3 mg into the muscle as needed for anaphylaxis. Use as directed for severe allergic reaction 05/27/23  Yes Kozlow, Eric J, MD  esomeprazole  (NEXIUM ) 40 MG capsule Take 1 capsule (40 mg total) by mouth 2 (two) times daily. 05/27/23  Yes Kozlow, Rema Care,  MD  ezetimibe  (ZETIA ) 10 MG tablet TAKE 1 TABLET EVERY DAY 11/11/22  Yes Bevelyn Bryant, MD  Fluocinolone  Acetonide Scalp (DERMA-SMOOTHE Brennan Camara SCALP) 0.01 % OIL Apply 1-7 times a week 05/28/23  Yes Kozlow, Rema Care, MD  fluticasone  (FLONASE ) 50 MCG/ACT nasal spray Place 2 sprays into both nostrils daily. 05/27/23  Yes Kozlow, Eric J, MD  glucose blood (TRUE METRIX BLOOD GLUCOSE TEST) test strip USE AS DIRECTED 03/19/22  Yes Bevelyn Bryant, MD  hydrALAZINE  (APRESOLINE ) 25 MG tablet Take 25 mg by mouth 2 (two) times daily. 03/20/23  Yes [provider]  levothyroxine  (SYNTHROID ) 50 MCG tablet TAKE 1 TABLET EVERY DAY, AT LEAST 30-60 MINUTES BEFORE OTHER FOOD OR MEDICINE. 08/12/22  Yes Bevelyn Bryant, MD  metoprolol  succinate (TOPROL  XL) 50 MG 24 hr tablet Take 1 tablet (50 mg total) by mouth daily. Take with or immediately following a meal. Patient not taking: Reported on 04/17/2023 07/24/21 11/26/22  Vita Grip, MD  montelukast  (SINGULAIR ) 10 MG tablet Take 1 tablet (10 mg total) by mouth at bedtime.  05/27/23  Yes Kozlow, Eric J, MD  Olopatadine -Mometasone  (RYALTRIS ) [phone] MCG/ACT SUSP Place 2 sprays into the nose 2 (two) times daily. 11/26/22  Yes Kozlow, Rema Care, MD  rosuvastatin  (CRESTOR ) 40 MG tablet TAKE 1 TABLET EVERY DAY (NEED MD APPOINTMENT FOR REFILLS) 11/01/22  Yes Bevelyn Bryant, MD  [facility] injection Inject 0.5 mLs into the muscle once. 11/05/22   [provider]  spironolactone  (ALDACTONE ) 25 MG tablet Take 25 mg by mouth 2 (two) times daily. 11/01/22   [provider]  spironolactone  (ALDACTONE ) 50 MG tablet Take 1.5 tablets (75 mg total) by mouth daily. 01/13/23  Yes Bevelyn Bryant, MD    Family History Family History  Problem Relation Age of Onset   Dementia Mother    Gallbladder disease Mother    Diabetes Father    Heart failure Father    Hypertension Father    Sleep apnea Father    Kidney disease Father    Dementia Father    Thyroid  disease Sister    Hypertension Sister    Dementia Sister    Healthy Sister     Social History Social History   Tobacco Use   Smoking status: Former    Current packs/day: 0.00    Types: Cigarettes    Quit date: 02/04/2006    Years since quitting: 17.4   Smokeless tobacco: Never  Vaping Use   Vaping status: Never Used  Substance Use Topics   Alcohol  use: No    Alcohol /week: 0.0 standard drinks of alcohol    Drug use: No     Allergies   Bee venom and Penicillins   Review of Systems Review of Systems  Constitutional:  Negative for chills and fever.  HENT:  Positive for congestion and sore throat. Negative for ear pain.   Eyes:  Negative for discharge and redness.  Respiratory:  Positive for cough and wheezing. Negative for shortness of breath.   Gastrointestinal:  Negative for abdominal pain, diarrhea, nausea and vomiting.     Physical Exam Triage Vital Signs ED Triage Vitals [06/30/23 1022]  Encounter Vitals Group     BP (!) 185/86     Systolic BP Percentile      Diastolic BP Percentile      Pulse Rate  87     Resp 16     Temp 98.3 F (36.8 C)     Temp Source Oral     SpO2 95 %  Weight      Height      Head Circumference      Peak Flow      Pain Score      Pain Loc      Pain Education      Exclude from Growth Chart    No data found.  Updated Vital Signs BP (!) 185/86 (BP Location: Left Arm)   Pulse 87   Temp 98.3 F (36.8 C) (Oral)   Resp 16   SpO2 95%   Visual Acuity Right Eye Distance:   Left Eye Distance:   Bilateral Distance:    Right Eye Near:   Left Eye Near:    Bilateral Near:     Physical Exam Vitals and nursing note reviewed.  Constitutional:      General: She is not in acute distress.    Appearance: Normal appearance. She is not ill-appearing.  HENT:     Head: Normocephalic and atraumatic.     Nose: Congestion present.     Mouth/Throat:     Mouth: Mucous membranes are moist.     Pharynx: No oropharyngeal exudate or posterior oropharyngeal erythema.  Eyes:     Conjunctiva/sclera: Conjunctivae normal.  Cardiovascular:     Rate and Rhythm: Normal rate and regular rhythm.     Heart sounds: Normal heart sounds. No murmur heard. Pulmonary:     Effort: Pulmonary effort is normal. No respiratory distress.     Breath sounds: Wheezing present. No rhonchi or rales.     Comments: Diffuse wheezing noted on exam Skin:    General: Skin is warm and dry.  Neurological:     Mental Status: She is alert.  Psychiatric:        Mood and Affect: Mood normal.        Thought Content: Thought content normal.      UC Treatments / Results  Labs (all labs ordered are listed, but only abnormal results are displayed) Labs Reviewed  POC SARS CORONAVIRUS 2 AG -  ED    EKG   Radiology No results found.  Procedures Procedures (including critical care time)  Medications Ordered in UC Medications  ipratropium-albuterol  (DUONEB) 0.5-2.5 (3) MG/3ML nebulizer solution 3 mL (3 mLs Nebulization Given 06/30/23 1059)    Initial Impression / Assessment and Plan /  UC Course  I have reviewed the triage vital signs and the nursing notes.  Pertinent labs & imaging results that were available during my care of the patient were reviewed by me and considered in my medical decision making (see chart for details).    Suspect symptoms are related to asthma exacerbation.  Given diffuse wheezing on exam ordered DuoNeb which was helpful in office.  Will treat with low-dose steroid burst given known diabetes and advised to monitor glucose levels at home.  Recommended follow-up if no gradual improvement or with any further concerns.  Final Clinical Impressions(s) / UC Diagnoses   Final diagnoses:  Asthma with acute exacerbation, unspecified asthma severity, unspecified whether persistent  Type 2 diabetes mellitus with hyperglycemia, without long-term current use of insulin [facility])   Discharge Instructions   None    ED Prescriptions     Medication Sig Dispense Auth. Provider   predniSONE  (DELTASONE ) 20 MG tablet Take 1 tablet (20 mg total) by mouth daily with breakfast for 5 days. 5 tablet Vernestine Gondola, PA-C      PDMP not reviewed this encounter.   Vernestine Gondola, PA-C 06/30/23 1224

## 2023-07-01 ENCOUNTER — Ambulatory Visit

## 2023-07-04 ENCOUNTER — Encounter: Payer: Self-pay | Admitting: Family

## 2023-07-04 ENCOUNTER — Other Ambulatory Visit: Payer: Self-pay | Admitting: Family

## 2023-07-04 DIAGNOSIS — R222 Localized swelling, mass and lump, trunk: Secondary | ICD-10-CM

## 2023-07-07 ENCOUNTER — Ambulatory Visit
Admission: RE | Admit: 2023-07-07 | Discharge: 2023-07-07 | Disposition: A | Discharge status: Home / Self Care | Source: Ambulatory Visit | Attending: Family | Admitting: Family

## 2023-07-07 DIAGNOSIS — R222 Localized swelling, mass and lump, trunk: Secondary | ICD-10-CM

## 2023-07-09 ENCOUNTER — Ambulatory Visit: Payer: Self-pay | Admitting: Allergy and Immunology

## 2023-07-22 ENCOUNTER — Inpatient Hospital Stay: Payer: Medicare HMO | Attending: Hematology and Oncology

## 2023-07-22 DIAGNOSIS — D472 Monoclonal gammopathy: Secondary | ICD-10-CM | POA: Insufficient documentation

## 2023-07-22 LAB — CBC WITH DIFFERENTIAL (CANCER CENTER ONLY)
Abs Immature Granulocytes: 0 10*3/uL (ref 0.00–0.07)
Basophils Absolute: 0 10*3/uL (ref 0.0–0.1)
Basophils Relative: 0 %
Eosinophils Absolute: 0 10*3/uL (ref 0.0–0.5)
Eosinophils Relative: 0 %
HCT: 38.3 % (ref 36.0–46.0)
Hemoglobin: 12.8 g/dL (ref 12.0–15.0)
Immature Granulocytes: 0 %
Lymphocytes Relative: 38 %
Lymphs Abs: 1.7 10*3/uL (ref 0.7–4.0)
MCH: 26.7 pg (ref 26.0–34.0)
MCHC: 33.4 g/dL (ref 30.0–36.0)
MCV: 80 fL (ref 80.0–100.0)
Monocytes Absolute: 0.5 10*3/uL (ref 0.1–1.0)
Monocytes Relative: 10 %
Neutro Abs: 2.3 10*3/uL (ref 1.7–7.7)
Neutrophils Relative %: 52 %
Platelet Count: 196 10*3/uL (ref 150–400)
RBC: 4.79 MIL/uL (ref 3.87–5.11)
RDW: 14.7 % (ref 11.5–15.5)
WBC Count: 4.5 10*3/uL (ref 4.0–10.5)
nRBC: 0 % (ref 0.0–0.2)

## 2023-07-22 LAB — CMP (CANCER CENTER ONLY)
ALT: 34 U/L (ref 0–44)
AST: 45 U/L — ABNORMAL HIGH (ref 15–41)
Albumin: 4.2 g/dL (ref 3.5–5.0)
Alkaline Phosphatase: 84 U/L (ref 38–126)
Anion gap: 6 (ref 5–15)
BUN: 6 mg/dL — ABNORMAL LOW (ref 8–23)
CO2: 29 mmol/L (ref 22–32)
Calcium: 9.5 mg/dL (ref 8.9–10.3)
Chloride: 103 mmol/L (ref 98–111)
Creatinine: 0.79 mg/dL (ref 0.44–1.00)
GFR, Estimated: >60 mL/min (ref >60)
Glucose, Bld: 111 mg/dL — ABNORMAL HIGH (ref 70–99)
Potassium: 3.5 mmol/L (ref 3.5–5.1)
Sodium: 138 mmol/L (ref 135–145)
Total Bilirubin: 0.6 mg/dL (ref 0.0–1.2)
Total Protein: 7.8 g/dL (ref 6.5–8.1)

## 2023-07-23 LAB — KAPPA/LAMBDA LIGHT CHAINS
Kappa free light chain: 41.8 mg/L — ABNORMAL HIGH (ref 3.3–19.4)
Kappa, lambda light chain ratio: 3.1 — ABNORMAL HIGH (ref 0.26–1.65)
Lambda free light chains: 13.5 mg/L (ref 5.7–26.3)

## 2023-07-25 ENCOUNTER — Other Ambulatory Visit: Payer: Self-pay | Admitting: Family

## 2023-07-25 DIAGNOSIS — R222 Localized swelling, mass and lump, trunk: Secondary | ICD-10-CM

## 2023-07-25 LAB — MULTIPLE MYELOMA PANEL, SERUM
Albumin SerPl Elph-Mcnc: 3.5 g/dL (ref 2.9–4.4)
Albumin/Glob SerPl: 1.1 (ref 0.7–1.7)
Alpha 1: 0.2 g/dL (ref 0.0–0.4)
Alpha2 Glob SerPl Elph-Mcnc: 0.7 g/dL (ref 0.4–1.0)
B-Globulin SerPl Elph-Mcnc: 1.1 g/dL (ref 0.7–1.3)
Gamma Glob SerPl Elph-Mcnc: 1.5 g/dL (ref 0.4–1.8)
Globulin, Total: 3.5 g/dL (ref 2.2–3.9)
IgA: 157 mg/dL (ref 64–422)
IgG (Immunoglobin G), Serum: 1616 mg/dL — ABNORMAL HIGH (ref 586–1602)
IgM (Immunoglobulin M), Srm: 72 mg/dL (ref 26–217)
M Protein SerPl Elph-Mcnc: 0.9 g/dL — ABNORMAL HIGH
Total Protein ELP: 7 g/dL (ref 6.0–8.5)

## 2023-07-28 ENCOUNTER — Ambulatory Visit

## 2023-08-01 ENCOUNTER — Inpatient Hospital Stay: Payer: Medicare HMO | Admitting: Hematology and Oncology

## 2023-08-01 ENCOUNTER — Encounter: Payer: Self-pay | Admitting: Hematology and Oncology

## 2023-08-01 VITALS — BP 166/70 | HR 76 | Temp 97.8°F | Resp 18 | Ht 65.0 in

## 2023-08-01 DIAGNOSIS — D472 Monoclonal gammopathy: Secondary | ICD-10-CM | POA: Diagnosis not present

## 2023-08-01 NOTE — Assessment & Plan Note (Addendum)
Clinically, she has no signs of disease progression. Her myeloma panel is stable/better compared to previous years I will continue her yearly visit We discussed briefly the natural history of myeloma 

## 2023-08-01 NOTE — Progress Notes (Signed)
 [facility] OFFICE PROGRESS NOTE  Patient Care Team: Maree Leni Edyth DELENA, MD as PCP - General (Family Medicine) Livingston Rigg, MD (Inactive) as Consulting Physician (Dermatology)  ASSESSMENT & PLAN:  Assessment & Plan MGUS (monoclonal gammopathy of unknown significance) Clinically, she has no signs of disease progression. Her myeloma panel is stable/better compared to previous years I will continue her yearly visit We discussed briefly the natural history of myeloma  Orders Placed This Encounter  Procedures   CBC with Differential/Platelet    Standing Status:   Standing    Number of Occurrences:   22    Expiration Date:   07/31/2024   Comprehensive metabolic panel with GFR    Standing Status:   Standing    Number of Occurrences:   33    Expiration Date:   07/31/2024   Kappa/lambda light chains    Standing Status:   Standing    Number of Occurrences:   22    Expiration Date:   07/31/2024   Multiple Myeloma Panel (SPEP&IFE w/QIG)    Standing Status:   Standing    Number of Occurrences:   22    Expiration Date:   07/31/2024     INTERVAL HISTORY: she returns for surveillance follow-up for diagnosis of MGUS Patient denies recurrent infection or bone pain We reviewed recent CBC, CMP and myeloma panel results  PHYSICAL EXAMINATION: ECOG PERFORMANCE STATUS: 0 - Asymptomatic  Vitals:   08/01/23 1136  BP: (!) 166/70  Pulse: 76  Resp: 18  Temp: 97.8 F (36.6 C)  SpO2: 99%    SUMMARY OF ONCOLOGIC HISTORY:  I reviewed the patient's records extensive and collaborated the history with the patient. Summary of her history is as follows: She has a long-standing history of multiple arthralgias, left CMC pain, OA of the bilateral knees, hands and feet and osteoporosis for which she is on Prolia previously. She had SPEP drawn on 02/19/2013 that revealed a restricted band with monoclonal protein present, IgG kappa subtype. The monoclonal protein peak accounted for 1.38 g/dL  of the total 8.33 g/dL of protein in the gamma region. Her CMP demonstrated a creatinine of 0.81 and calcium  of 9.0 and total protein of 7.2 and albumin of 3.9; Her CBC was within normal limits with a hemoglobin of 12.5. Skeletal x-ray show no evidence of lytic lesions. She had a bone marrow biopsy on 06/22/2013 which show 10% of plasma cells. She was observed.

## 2023-09-29 ENCOUNTER — Ambulatory Visit: Admission: EM | Admit: 2023-09-29 | Discharge: 2023-09-29 | Disposition: A | Discharge status: Home / Self Care

## 2023-09-29 ENCOUNTER — Emergency Department (HOSPITAL_BASED_OUTPATIENT_CLINIC_OR_DEPARTMENT_OTHER): Admitting: Radiology

## 2023-09-29 ENCOUNTER — Encounter: Payer: Self-pay | Admitting: Emergency Medicine

## 2023-09-29 ENCOUNTER — Encounter (HOSPITAL_BASED_OUTPATIENT_CLINIC_OR_DEPARTMENT_OTHER): Payer: Self-pay

## 2023-09-29 DIAGNOSIS — E039 Hypothyroidism, unspecified: Secondary | ICD-10-CM | POA: Insufficient documentation

## 2023-09-29 DIAGNOSIS — R0602 Shortness of breath: Secondary | ICD-10-CM

## 2023-09-29 DIAGNOSIS — I2510 Atherosclerotic heart disease of native coronary artery without angina pectoris: Secondary | ICD-10-CM | POA: Diagnosis not present

## 2023-09-29 DIAGNOSIS — R0981 Nasal congestion: Secondary | ICD-10-CM

## 2023-09-29 DIAGNOSIS — I447 Left bundle-branch block, unspecified: Secondary | ICD-10-CM | POA: Diagnosis not present

## 2023-09-29 DIAGNOSIS — I110 Hypertensive heart disease with heart failure: Secondary | ICD-10-CM | POA: Diagnosis not present

## 2023-09-29 DIAGNOSIS — Z7982 Long term (current) use of aspirin: Secondary | ICD-10-CM | POA: Diagnosis not present

## 2023-09-29 DIAGNOSIS — Z79890 Hormone replacement therapy (postmenopausal): Secondary | ICD-10-CM | POA: Insufficient documentation

## 2023-09-29 DIAGNOSIS — E785 Hyperlipidemia, unspecified: Secondary | ICD-10-CM | POA: Diagnosis not present

## 2023-09-29 DIAGNOSIS — E1165 Type 2 diabetes mellitus with hyperglycemia: Secondary | ICD-10-CM | POA: Insufficient documentation

## 2023-09-29 DIAGNOSIS — Z86718 Personal history of other venous thrombosis and embolism: Secondary | ICD-10-CM | POA: Insufficient documentation

## 2023-09-29 DIAGNOSIS — J45901 Unspecified asthma with (acute) exacerbation: Secondary | ICD-10-CM | POA: Diagnosis not present

## 2023-09-29 DIAGNOSIS — I5032 Chronic diastolic (congestive) heart failure: Secondary | ICD-10-CM | POA: Insufficient documentation

## 2023-09-29 DIAGNOSIS — Z1211 Encounter for screening for malignant neoplasm of colon: Secondary | ICD-10-CM | POA: Insufficient documentation

## 2023-09-29 DIAGNOSIS — D472 Monoclonal gammopathy: Secondary | ICD-10-CM | POA: Insufficient documentation

## 2023-09-29 DIAGNOSIS — Z87891 Personal history of nicotine dependence: Secondary | ICD-10-CM | POA: Insufficient documentation

## 2023-09-29 DIAGNOSIS — Z7984 Long term (current) use of oral hypoglycemic drugs: Secondary | ICD-10-CM | POA: Diagnosis not present

## 2023-09-29 DIAGNOSIS — Z79899 Other long term (current) drug therapy: Secondary | ICD-10-CM | POA: Insufficient documentation

## 2023-09-29 DIAGNOSIS — R079 Chest pain, unspecified: Principal | ICD-10-CM | POA: Insufficient documentation

## 2023-09-29 DIAGNOSIS — Z7951 Long term (current) use of inhaled steroids: Secondary | ICD-10-CM | POA: Insufficient documentation

## 2023-09-29 LAB — BASIC METABOLIC PANEL WITH GFR
Anion gap: 12 (ref 5–15)
BUN: <5 mg/dL — ABNORMAL LOW (ref 8–23)
CO2: 26 mmol/L (ref 22–32)
Calcium: 9.5 mg/dL (ref 8.9–10.3)
Chloride: 102 mmol/L (ref 98–111)
Creatinine, Ser: 0.88 mg/dL (ref 0.44–1.00)
GFR, Estimated: >60 mL/min (ref >60)
Glucose, Bld: 144 mg/dL — ABNORMAL HIGH (ref 70–99)
Potassium: 3 mmol/L — ABNORMAL LOW (ref 3.5–5.1)
Sodium: 139 mmol/L (ref 135–145)

## 2023-09-29 LAB — POC COVID19/FLU A&B COMBO
Covid Antigen, POC: NEGATIVE
Influenza A Antigen, POC: NEGATIVE
Influenza B Antigen, POC: NEGATIVE

## 2023-09-29 LAB — CBC
HCT: 40 % (ref 36.0–46.0)
Hemoglobin: 13.5 g/dL (ref 12.0–15.0)
MCH: 27.3 pg (ref 26.0–34.0)
MCHC: 33.8 g/dL (ref 30.0–36.0)
MCV: 81 fL (ref 80.0–100.0)
Platelets: 185 K/uL (ref 150–400)
RBC: 4.94 MIL/uL (ref 3.87–5.11)
RDW: 14.6 % (ref 11.5–15.5)
WBC: 5 K/uL (ref 4.0–10.5)
nRBC: 0 % (ref 0.0–0.2)

## 2023-09-29 LAB — TROPONIN T, HIGH SENSITIVITY: Troponin T High Sensitivity: <15 ng/L (ref 0–19)

## 2023-09-29 MED ORDER — IPRATROPIUM-ALBUTEROL 0.5-2.5 (3) MG/3ML IN SOLN
3.0000 mL | Freq: Once | RESPIRATORY_TRACT | Status: AC
  Administered 2023-09-29: 3 mL via RESPIRATORY_TRACT
  Filled 2023-09-29: qty 3

## 2023-09-29 MED ORDER — ALBUTEROL SULFATE (2.5 MG/3ML) 0.083% IN NEBU
2.5000 mg | INHALATION_SOLUTION | Freq: Once | RESPIRATORY_TRACT | Status: AC
  Administered 2023-09-29: 2.5 mg via RESPIRATORY_TRACT
  Filled 2023-09-29: qty 3

## 2023-09-29 NOTE — ED Triage Notes (Signed)
 Pt reports waking up this morning with nasal congestion, chest congestion, productive cough, and intermittent wheezing. Deep, barky cough. Pain in ribcage with coughing. Pt notes hx of asthma and her insurance has her off allergy shots that were really helping respiratory symptoms. Denies fevers, sore throat, and chills. Has taken mucinex  today with little relief. Pt did visit sister in nursing home a few days ago with lots of virus outbreaks.

## 2023-09-29 NOTE — ED Notes (Addendum)
 Patient is being discharged from the Urgent Care and sent to the Emergency Department via Self (Private Vehicle) . Per Provider, patient is in need of higher level of care due to Leg swelling (unexplained), trouble breathing. Patient is aware and verbalizes understanding of plan of care.  Vitals:   09/29/23 1940 09/29/23 1946  BP: (!) 160/85 (!) 144/83  Pulse: 84   Resp: 20   Temp: 97.9 F (36.6 C)   SpO2: 96%

## 2023-09-29 NOTE — Discharge Instructions (Signed)
 You were seen today briefly for concerns of shortness of breath.  You are negative for COVID-19, influenza A/B.  Given your history and your increased trouble breathing and leg swelling that is developed, I do think you need more evaluation in the emergency department.  Please go to your nearest emergency department which would [facility] emergency department.  [address] [place], [place].

## 2023-09-29 NOTE — ED Provider Notes (Signed)
 [name]    CSN: [id number] Arrival date & time: 09/29/23  1850      History   Chief Complaint Chief Complaint  Patient presents with   Nasal Congestion   Wheezing   Cough    HPI Andrea Lowery is a 83 y.o. female.  Patient with past history significant for hypertension, type 2 diabetes, CHF presents to urgent care with concerns of congestion, shortness of breath, and wheezing.  Reports is progressively worsening over the last several days with what she reports as a productive cough with green-colored phlegm.  Endorses some pain in her ribs when she coughs.  She states that she has not had any sick contacts that she is aware.  No reported fevers, sore throat or chills.  Has been using Mucinex  today without improvement in symptoms.  She does report that she was visiting her sister at her nursing home recently they recently had a viral outbreak although she is unclear exactly what this was.   Wheezing Associated symptoms: cough   Cough Associated symptoms: wheezing     Past Medical History:  Diagnosis Date   Arthritis    Asthma    Asthma 01/10/2016   Last Assessment & Plan:  After recent ED eval- currrently utilizing prednisone  as prescribed. Begin Levaquin , prednisone  and Albuterol  MDI to be utilized as needed for acute wheezing FU in 2 day is no improvement or worsening of symptoms. Pt verbalizes understanding for current treatment plan. Follow-up in one week. Plan to increase chronic inhaled steroid dose to assess further with prevention of   Diabetes mellitus without complication (HCC)    Diabetes II   Difficulty sleeping 12/28/2013   Last Assessment & Plan:  Stable with trazodone  nightly. Continue current treatment plan. Refill trazodone  today. Tolerating medication well without side effects. Return in 3 months for follow up or sooner if any worsening of symptoms.    DOE (dyspnea on exertion) 07/01/2012   DVT (deep venous thrombosis) (HCC)    Years  ago   Dyslipidemia    Eczema    GERD (gastroesophageal reflux disease)    Headache    Migraine   Hypertension    Hypokalemia 04/30/2016   Hypothyroidism    LBBB (left bundle branch block)    MGUS (monoclonal gammopathy of unknown significance)    MGUS (monoclonal gammopathy of unknown significance)    Migraine    OSA on CPAP    Sinus infection    had sinus surgery   Urinary frequency 07/25/2021    Patient Active Problem List   Diagnosis Date Noted   Encounter for screening for malignant neoplasm of colon 09/29/2023   Right shoulder tendinitis 06/17/2022   Seasonal allergic conjunctivitis 04/22/2022   Moderate persistent asthma 11/05/2021   Trigeminal neuralgia of left side of face 11/05/2021   Arthritis 11/05/2021   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   Vitamin D  insufficiency 10/15/2021   De Quervain's tenosynovitis, left 09/21/2021   Seasonal and perennial allergic rhinitis 08/18/2019   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   Eczema 08/02/2016   Osteoporosis without current pathological fracture 05/30/2015   Chronic right-sided low back pain with right-sided sciatica 03/31/2015   Type 2 diabetes mellitus with hyperglycemia (HCC) 11/07/2014   Diastolic CHF (HCC) 11/07/2014   Sacroiliac dysfunction 11/04/2014   LPRD (laryngopharyngeal reflux disease) 10/05/2014   Hypothyroidism 09/21/2014   Thyroid  nodule 07/01/2014   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/18/2014   Healthcare maintenance 01/25/2014   Hyperlipidemia 12/27/2013   Bilateral leg edema  07/20/2013   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/2013   Essential hypertension 07/01/2012    Past Surgical History:  Procedure Laterality Date   ABDOMINAL HYSTERECTOMY  1969   ARTERY BIOPSY N/A 11/07/2014   Procedure: BIOPSY TEMPORAL ARTERY;  Surgeon: Daniel Moccasin, MD;  Location: [facility] OR;  Service: ENT;  Laterality: N/A;   CARPAL TUNNEL RELEASE Right    CHOLECYSTECTOMY  1976   ENDOVENOUS ABLATION  SAPHENOUS VEIN W/ LASER Left 08/24/2020   endovenous laser ablation left greater saphenous vein by Medford Blade MD   LUMBAR LAMINECTOMY/DECOMPRESSION MICRODISCECTOMY N/A 07/31/2015   Procedure: L4-5 Decompression, Possible Right L4-5 Microdiscectomy;  Surgeon: Oneil JAYSON Herald, MD;  Location: [facility] OR;  Service: Orthopedics;  Laterality: N/A;   NASAL SINUS SURGERY     years ago   NM MYOVIEW  LTD  02/13/2010   No ischemia   PLANTAR FASCIA RELEASE     TONSILLECTOMY  1965   US  ECHOCARDIOGRAPHY  09/20/2008   borderline LVH,mild TR,AOV mildly sclerotic w/ca+ of the leaflets    OB History   No obstetric history on file.      Home Medications    Prior to Admission medications   Medication Sig Start Date End Date Taking? Authorizing Provider  albuterol  (VENTOLIN  HFA) 108 (90 Base) MCG/ACT inhaler INHALE 2 PUFFS EVERY 4 HOURS AS NEEDED FOR WHEEZING OR SHORTNESS OF BREATH 08/05/22  Yes Ambs, Arlean HERO, FNP  amLODipine  (NORVASC ) 5 MG tablet Take 5 mg by mouth daily. 06/26/23  Yes [provider]  Blood Glucose Monitoring Suppl (TRUE METRIX METER) w/Device KIT Use as instructed 05/22/21  Yes Ariwodo, Shirley, MD  budesonide -formoterol  (SYMBICORT ) 160-4.5 MCG/ACT inhaler INHALE 2 PUFFS BY MOUTH INTO THE LUNGS TWICE DAILY RINSE GARGLE AND SPIT AFTER USE 11/26/22  Yes Kozlow, Camellia PARAS, MD  busPIRone  (BUSPAR ) 7.5 MG tablet Take 7.5 mg by mouth 2 (two) times daily. 01/10/23  Yes [provider]  cetirizine  (ZYRTEC ) 10 MG tablet Take 1 tablet (10 mg total) by mouth daily as needed for allergies (Can take an extra dose during flare ups.). 11/26/22  Yes Kozlow, Camellia PARAS, MD  diclofenac  Sodium (VOLTAREN  ARTHRITIS PAIN) 1 % GEL Apply 2 g topically 4 (four) times daily. 04/17/23  Yes Raspet, Erin K, PA-C  empagliflozin  (JARDIANCE ) 25 MG TABS tablet Take 1 tablet (25 mg total) by mouth daily before breakfast. 09/12/22  Yes Karna Fellows, MD  esomeprazole  (NEXIUM ) 40 MG capsule Take 1 capsule (40 mg total) by  mouth 2 (two) times daily. 05/27/23  Yes Kozlow, Camellia PARAS, MD  fluticasone  (FLONASE ) 50 MCG/ACT nasal spray Place 2 sprays into both nostrils daily. 05/27/23  Yes Kozlow, Eric J, MD  glucose blood (TRUE METRIX BLOOD GLUCOSE TEST) test strip USE AS DIRECTED 03/19/22  Yes Karna Fellows, MD  hydrALAZINE  (APRESOLINE ) 25 MG tablet Take 25 mg by mouth 2 (two) times daily. 03/20/23  Yes [provider]  levothyroxine  (SYNTHROID ) 50 MCG tablet TAKE 1 TABLET EVERY DAY, AT LEAST 30-60 MINUTES BEFORE OTHER FOOD OR MEDICINE. 08/12/22  Yes Karna Fellows, MD  montelukast  (SINGULAIR ) 10 MG tablet Take 1 tablet (10 mg total) by mouth at bedtime. 05/27/23  Yes Kozlow, Camellia PARAS, MD  rosuvastatin  (CRESTOR ) 40 MG tablet TAKE 1 TABLET EVERY DAY (NEED MD APPOINTMENT FOR REFILLS) 11/01/22  Yes Karna Fellows, MD  acetaminophen -codeine  (TYLENOL  #3) 300-30 MG tablet Take 1 tablet by mouth every 8 (eight) hours as needed. 03/20/23   [provider]  amLODipine -olmesartan  (AZOR ) 10-40 MG  tablet TAKE 1 TABLET EVERY DAY Patient not taking: Reported on 09/29/2023 06/14/22   Karna Fellows, MD  Benralizumab  (FASENRA  PEN) 30 MG/ML SOAJ Inject 1 mL (30 mg total) into the skin every 8 (eight) weeks. 07/04/22   Cari Arlean HERO, FNP  benzonatate  (TESSALON ) 100 MG capsule Take 1 capsule (100 mg total) by mouth every 8 (eight) hours. Patient not taking: Reported on 09/29/2023 03/16/23   Teresa Almarie LABOR, PA-C  budesonide -formoterol  (SYMBICORT ) 160-4.5 MCG/ACT inhaler 2 inhalations 2 times per day w/ spacer 05/27/23   Kozlow, Camellia PARAS, MD  clindamycin  (CLEOCIN ) 300 MG capsule Take 300 mg by mouth every 8 (eight) hours. Patient not taking: Reported on 09/29/2023 06/26/23   [provider]  Dextrose , Diabetic Use, (RELION GLUCOSE PO) USE AS DIRECTED TO MEASURE BLOOD GLUCOSE UP TO ONE TIME DAILY Patient not taking: Reported on 09/29/2023 01/14/23   [provider]  diazepam  (VALIUM ) 2 MG tablet Take 2 mg by mouth daily as needed. Patient not  taking: Reported on 09/29/2023 05/22/23   [provider]  DULoxetine  (CYMBALTA ) 30 MG capsule Take 1 capsule (30 mg total) by mouth daily. Patient not taking: Reported on 09/29/2023 08/23/22   Zheng, Michael, DO  EPINEPHrine  0.3 mg/0.3 mL IJ SOAJ injection Inject 0.3 mg into the muscle as needed for anaphylaxis. Use as directed for severe allergic reaction 05/27/23   Kozlow, Camellia PARAS, MD  ezetimibe  (ZETIA ) 10 MG tablet TAKE 1 TABLET EVERY DAY Patient not taking: Reported on 09/29/2023 11/11/22   Karna Fellows, MD  Fluocinolone  Acetonide Scalp (DERMA-SMOOTHE JANA SCALP) 0.01 % OIL Apply 1-7 times a week Patient not taking: Reported on 09/29/2023 05/28/23   Kozlow, Eric J, MD  hydrochlorothiazide  (HYDRODIURIL ) 25 MG tablet Take 25 mg by mouth every morning. Patient not taking: Reported on 09/29/2023 07/28/23   [provider]  hydrOXYzine  (ATARAX ) 25 MG tablet Take 25 mg by mouth 3 (three) times daily as needed. Patient not taking: Reported on 09/29/2023 05/22/23   [provider]  metoprolol  succinate (TOPROL  XL) 50 MG 24 hr tablet Take 1 tablet (50 mg total) by mouth daily. Take with or immediately following a meal. Patient not taking: Reported on 04/17/2023 07/24/21 11/26/22  Lou Claretta HERO, MD  Olopatadine -Mometasone  (RYALTRIS ) [phone] MCG/ACT SUSP Place 2 sprays into the nose 2 (two) times daily. Patient not taking: Reported on 09/29/2023 11/26/22   Kozlow, Eric J, MD  [facility] injection Inject 0.5 mLs into the muscle once. 11/05/22   [provider]  spironolactone  (ALDACTONE ) 25 MG tablet Take 25 mg by mouth 2 (two) times daily. Patient not taking: Reported on 09/29/2023 11/01/22   [provider]  spironolactone  (ALDACTONE ) 50 MG tablet Take 1.5 tablets (75 mg total) by mouth daily. Patient not taking: Reported on 09/29/2023 01/13/23   Karna Fellows, MD    Family History Family History  Problem Relation Age of Onset   Dementia Mother    Gallbladder disease Mother     Diabetes Father    Heart failure Father    Hypertension Father    Sleep apnea Father    Kidney disease Father    Dementia Father    Thyroid  disease Sister    Hypertension Sister    Dementia Sister    Healthy Sister     Social History Social History   Tobacco Use   Smoking status: Former    Current packs/day: 0.00    Types: Cigarettes    Quit date: 02/04/2006    Years since  quitting: 17.6   Smokeless tobacco: Never  Vaping Use   Vaping status: Never Used  Substance Use Topics   Alcohol  use: No    Alcohol /week: 0.0 standard drinks of alcohol    Drug use: No     Allergies   Bee venom and Penicillins   Review of Systems Review of Systems  Respiratory:  Positive for cough and wheezing.   All other systems reviewed and are negative.    Physical Exam Triage Vital Signs ED Triage Vitals [09/29/23 1940]  Encounter Vitals Group     BP (!) 160/85     Girls Systolic BP Percentile      Girls Diastolic BP Percentile      Boys Systolic BP Percentile      Boys Diastolic BP Percentile      Pulse Rate 84     Resp 20     Temp 97.9 F (36.6 C)     Temp Source Oral     SpO2 96 %     Weight      Height      Head Circumference      Peak Flow      Pain Score 7     Pain Loc      Pain Education      Exclude from Growth Chart    No data found.  Updated Vital Signs BP (!) 144/83 (BP Location: Right Arm)   Pulse 84   Temp 97.9 F (36.6 C) (Oral)   Resp 20   SpO2 96%   Visual Acuity Right Eye Distance:   Left Eye Distance:   Bilateral Distance:    Right Eye Near:   Left Eye Near:    Bilateral Near:     Physical Exam Vitals and nursing note reviewed.  Constitutional:      General: She is not in acute distress.    Appearance: She is well-developed.  HENT:     Head: Normocephalic and atraumatic.  Eyes:     Conjunctiva/sclera: Conjunctivae normal.  Cardiovascular:     Rate and Rhythm: Normal rate and regular rhythm.     Heart sounds: No murmur heard.     Comments: 1+ edema in bilateral lower extremities. Pulmonary:     Effort: Pulmonary effort is normal. No respiratory distress.     Breath sounds: Wheezing, rhonchi and rales present.     Comments: Coarse lung sounds all over. Abdominal:     Palpations: Abdomen is soft.     Tenderness: There is no abdominal tenderness.  Musculoskeletal:        General: No swelling.     Cervical back: Neck supple.  Skin:    General: Skin is warm and dry.     Capillary Refill: Capillary refill takes less than 2 seconds.  Neurological:     Mental Status: She is alert.  Psychiatric:        Mood and Affect: Mood normal.      UC Treatments / Results  Labs (all labs ordered are listed, but only abnormal results are displayed) Labs Reviewed  POC COVID19/FLU A&B COMBO - Normal    EKG   Radiology No results found.  Procedures Procedures (including critical care time)  Medications Ordered in UC Medications - No data to display  Initial Impression / Assessment and Plan / UC Course  I have reviewed the triage vital signs and the nursing notes.  Pertinent labs & imaging results that were available during my care of the patient were reviewed  by me and considered in my medical decision making (see chart for details).   This patient presents to the UC for concern of congestion, shortness of breath.  Differential diagnosis includes viral URI, bronchitis, pneumonia, CHF   Lab Tests:  I Ordered, and personally interpreted labs.  The pertinent results include: Respiratory panel negative for COVID-19 and influenza   Problem List / UC Course:  Briefly seen urgent care today with concerns of congestion, cough, wheezing, shortness of breath.  Reports shortness of breath is progressively worsening over the last several days.  Endorses increased exertional dyspnea.  Does endorse some increased leg swelling but she is not currently any diuretic.  Does endorse a history of CHF.  No reported fever, chills  or bodyaches. Physical exam reveals bilateral trace edema with significant lung sounds in all lung fields including wheezing, rales, and rhonchi. Respiratory panel negative for COVID-19 influenza A/B. Based on patient's history of CHF, increased work of breathing primarily with exertion, concern for possible CHF versus pneumonia.  Given patient's age, she will require further workup if pneumonia is present to determine if she can safely be discharged home.  Advised patient that she would benefit further workup in the emergency department for evaluation of her congestion and shortness of breath.  States stable at this time for transport via POV to [facility] emergency department.  She will be taken there by her son.  Social Determinants of Health:  None  Final Clinical Impressions(s) / UC Diagnoses   Final diagnoses:  Nasal congestion  Shortness of breath     Discharge Instructions      You were seen today briefly for concerns of shortness of breath.  You are negative for COVID-19, influenza A/B.  Given your history and your increased trouble breathing and leg swelling that is developed, I do think you need more evaluation in the emergency department.  Please go to your nearest emergency department which would [facility] emergency department.  [address], [place], [place].    ED Prescriptions   None    PDMP not reviewed this encounter.   Terrilee Dudzik A, PA-C 09/29/23 2017

## 2023-09-29 NOTE — ED Triage Notes (Addendum)
 Pt reports cough for 3-4 days and presented to urgent care, negative for covid and flu but advised to present to the emergency room for shortness of breath and increased leg swelling. Patient reports midsternal chest pain that started yesterday.

## 2023-09-30 ENCOUNTER — Observation Stay (HOSPITAL_BASED_OUTPATIENT_CLINIC_OR_DEPARTMENT_OTHER)
Admission: EM | Admit: 2023-09-30 | Discharge: 2023-10-01 | Disposition: A | Discharge status: Home / Self Care | Attending: Internal Medicine | Admitting: Internal Medicine

## 2023-09-30 ENCOUNTER — Emergency Department (HOSPITAL_BASED_OUTPATIENT_CLINIC_OR_DEPARTMENT_OTHER)

## 2023-09-30 ENCOUNTER — Other Ambulatory Visit (HOSPITAL_COMMUNITY): Payer: Self-pay

## 2023-09-30 ENCOUNTER — Encounter (HOSPITAL_COMMUNITY): Payer: Self-pay | Admitting: Student in an Organized Health Care Education/Training Program

## 2023-09-30 ENCOUNTER — Other Ambulatory Visit: Payer: Self-pay

## 2023-09-30 DIAGNOSIS — D472 Monoclonal gammopathy: Secondary | ICD-10-CM | POA: Diagnosis not present

## 2023-09-30 DIAGNOSIS — I447 Left bundle-branch block, unspecified: Secondary | ICD-10-CM | POA: Diagnosis not present

## 2023-09-30 DIAGNOSIS — I5032 Chronic diastolic (congestive) heart failure: Secondary | ICD-10-CM | POA: Diagnosis present

## 2023-09-30 DIAGNOSIS — I2510 Atherosclerotic heart disease of native coronary artery without angina pectoris: Secondary | ICD-10-CM | POA: Diagnosis not present

## 2023-09-30 DIAGNOSIS — E7800 Pure hypercholesterolemia, unspecified: Secondary | ICD-10-CM | POA: Diagnosis not present

## 2023-09-30 DIAGNOSIS — I110 Hypertensive heart disease with heart failure: Secondary | ICD-10-CM | POA: Diagnosis not present

## 2023-09-30 DIAGNOSIS — E1165 Type 2 diabetes mellitus with hyperglycemia: Secondary | ICD-10-CM | POA: Diagnosis present

## 2023-09-30 DIAGNOSIS — I6523 Occlusion and stenosis of bilateral carotid arteries: Secondary | ICD-10-CM

## 2023-09-30 DIAGNOSIS — Z7984 Long term (current) use of oral hypoglycemic drugs: Secondary | ICD-10-CM | POA: Diagnosis not present

## 2023-09-30 DIAGNOSIS — I10 Essential (primary) hypertension: Secondary | ICD-10-CM | POA: Diagnosis not present

## 2023-09-30 DIAGNOSIS — Z7951 Long term (current) use of inhaled steroids: Secondary | ICD-10-CM | POA: Diagnosis not present

## 2023-09-30 DIAGNOSIS — Z79899 Other long term (current) drug therapy: Secondary | ICD-10-CM | POA: Diagnosis not present

## 2023-09-30 DIAGNOSIS — E039 Hypothyroidism, unspecified: Secondary | ICD-10-CM | POA: Diagnosis not present

## 2023-09-30 DIAGNOSIS — Z79890 Hormone replacement therapy (postmenopausal): Secondary | ICD-10-CM | POA: Diagnosis not present

## 2023-09-30 DIAGNOSIS — Z87891 Personal history of nicotine dependence: Secondary | ICD-10-CM | POA: Diagnosis not present

## 2023-09-30 DIAGNOSIS — J45901 Unspecified asthma with (acute) exacerbation: Secondary | ICD-10-CM | POA: Diagnosis not present

## 2023-09-30 DIAGNOSIS — R079 Chest pain, unspecified: Secondary | ICD-10-CM | POA: Diagnosis present

## 2023-09-30 DIAGNOSIS — Z7982 Long term (current) use of aspirin: Secondary | ICD-10-CM | POA: Diagnosis not present

## 2023-09-30 DIAGNOSIS — E118 Type 2 diabetes mellitus with unspecified complications: Secondary | ICD-10-CM | POA: Diagnosis not present

## 2023-09-30 DIAGNOSIS — I200 Unstable angina: Secondary | ICD-10-CM | POA: Diagnosis not present

## 2023-09-30 DIAGNOSIS — Z86718 Personal history of other venous thrombosis and embolism: Secondary | ICD-10-CM | POA: Diagnosis not present

## 2023-09-30 DIAGNOSIS — E785 Hyperlipidemia, unspecified: Secondary | ICD-10-CM | POA: Diagnosis not present

## 2023-09-30 LAB — BASIC METABOLIC PANEL WITH GFR
Anion gap: 10 (ref 5–15)
BUN: 5 mg/dL — ABNORMAL LOW (ref 8–23)
CO2: 27 mmol/L (ref 22–32)
Calcium: 9.8 mg/dL (ref 8.9–10.3)
Chloride: 101 mmol/L (ref 98–111)
Creatinine, Ser: 0.84 mg/dL (ref 0.44–1.00)
GFR, Estimated: >60 mL/min (ref >60)
Glucose, Bld: 159 mg/dL — ABNORMAL HIGH (ref 70–99)
Potassium: 4 mmol/L (ref 3.5–5.1)
Sodium: 138 mmol/L (ref 135–145)

## 2023-09-30 LAB — GLUCOSE, CAPILLARY
Glucose-Capillary: 142 mg/dL — ABNORMAL HIGH (ref 70–99)
Glucose-Capillary: 218 mg/dL — ABNORMAL HIGH (ref 70–99)

## 2023-09-30 LAB — PRO BRAIN NATRIURETIC PEPTIDE: Pro Brain Natriuretic Peptide: 73.8 pg/mL (ref <300.0)

## 2023-09-30 LAB — D-DIMER, QUANTITATIVE: D-Dimer, Quant: 1.06 ug{FEU}/mL — ABNORMAL HIGH (ref 0.00–0.50)

## 2023-09-30 LAB — TROPONIN T, HIGH SENSITIVITY: Troponin T High Sensitivity: <15 ng/L (ref 0–19)

## 2023-09-30 MED ORDER — AMLODIPINE BESYLATE 10 MG PO TABS
10.0000 mg | ORAL_TABLET | Freq: Every day | ORAL | Status: DC
  Administered 2023-09-30 – 2023-10-01 (×2): 10 mg via ORAL
  Filled 2023-09-30 (×2): qty 1

## 2023-09-30 MED ORDER — ACETAMINOPHEN 650 MG RE SUPP: 650.0000 mg | Freq: Four times a day (QID) | RECTAL | Status: DC | PRN

## 2023-09-30 MED ORDER — IPRATROPIUM-ALBUTEROL 0.5-2.5 (3) MG/3ML IN SOLN
3.0000 mL | Freq: Three times a day (TID) | RESPIRATORY_TRACT | Status: DC
  Administered 2023-09-30 (×2): 3 mL via RESPIRATORY_TRACT
  Filled 2023-09-30 (×2): qty 3

## 2023-09-30 MED ORDER — HYDROCHLOROTHIAZIDE 25 MG PO TABS: 25.0000 mg | ORAL_TABLET | Freq: Every morning | ORAL | Status: DC

## 2023-09-30 MED ORDER — FLUTICASONE FUROATE-VILANTEROL 200-25 MCG/ACT IN AEPB
1.0000 | INHALATION_SPRAY | Freq: Every day | RESPIRATORY_TRACT | Status: DC
  Administered 2023-10-01: 1 via RESPIRATORY_TRACT
  Filled 2023-09-30: qty 28

## 2023-09-30 MED ORDER — INSULIN ASPART 100 UNIT/ML IJ SOLN
0.0000 [IU] | Freq: Every day | INTRAMUSCULAR | Status: DC
  Administered 2023-09-30: 2 [IU] via SUBCUTANEOUS

## 2023-09-30 MED ORDER — METHYLPREDNISOLONE SODIUM SUCC 40 MG IJ SOLR
40.0000 mg | Freq: Once | INTRAMUSCULAR | Status: AC
  Administered 2023-09-30: 40 mg via INTRAVENOUS
  Filled 2023-09-30: qty 1

## 2023-09-30 MED ORDER — EMPAGLIFLOZIN 25 MG PO TABS
25.0000 mg | ORAL_TABLET | Freq: Every day | ORAL | Status: DC
  Filled 2023-09-30: qty 1

## 2023-09-30 MED ORDER — ENOXAPARIN SODIUM 40 MG/0.4ML IJ SOSY: 40.0000 mg | PREFILLED_SYRINGE | INTRAMUSCULAR | Status: DC

## 2023-09-30 MED ORDER — IRBESARTAN 300 MG PO TABS
300.0000 mg | ORAL_TABLET | Freq: Every day | ORAL | Status: DC
  Administered 2023-09-30: 300 mg via ORAL
  Filled 2023-09-30 (×2): qty 1

## 2023-09-30 MED ORDER — HYDRALAZINE HCL 20 MG/ML IJ SOLN: 5.0000 mg | Freq: Four times a day (QID) | INTRAMUSCULAR | Status: DC | PRN

## 2023-09-30 MED ORDER — INSULIN ASPART 100 UNIT/ML IJ SOLN
0.0000 [IU] | Freq: Three times a day (TID) | INTRAMUSCULAR | Status: DC
  Administered 2023-10-01: 2 [IU] via SUBCUTANEOUS

## 2023-09-30 MED ORDER — LEVOTHYROXINE SODIUM 50 MCG PO TABS: 50.0000 ug | ORAL_TABLET | Freq: Every day | ORAL | Status: DC

## 2023-09-30 MED ORDER — MONTELUKAST SODIUM 10 MG PO TABS
10.0000 mg | ORAL_TABLET | Freq: Every day | ORAL | Status: DC
  Administered 2023-09-30: 10 mg via ORAL
  Filled 2023-09-30: qty 1

## 2023-09-30 MED ORDER — BENZONATATE 100 MG PO CAPS
100.0000 mg | ORAL_CAPSULE | Freq: Three times a day (TID) | ORAL | Status: DC
  Administered 2023-09-30 (×2): 100 mg via ORAL
  Filled 2023-09-30 (×2): qty 1

## 2023-09-30 MED ORDER — METOPROLOL SUCCINATE ER 50 MG PO TB24
50.0000 mg | ORAL_TABLET | Freq: Every day | ORAL | Status: DC
  Administered 2023-09-30: 50 mg via ORAL
  Filled 2023-09-30: qty 1

## 2023-09-30 MED ORDER — ACETAMINOPHEN 325 MG PO TABS: 650.0000 mg | ORAL_TABLET | Freq: Four times a day (QID) | ORAL | Status: DC | PRN

## 2023-09-30 MED ORDER — ONDANSETRON HCL 4 MG/2ML IJ SOLN: 4.0000 mg | Freq: Four times a day (QID) | INTRAMUSCULAR | Status: DC | PRN

## 2023-09-30 MED ORDER — ROSUVASTATIN CALCIUM 20 MG PO TABS: 40.0000 mg | ORAL_TABLET | Freq: Every day | ORAL | Status: DC

## 2023-09-30 MED ORDER — ONDANSETRON HCL 4 MG PO TABS
4.0000 mg | ORAL_TABLET | Freq: Four times a day (QID) | ORAL | Status: DC | PRN
Start: 2023-09-30 — End: 2023-10-01

## 2023-09-30 MED ORDER — PREDNISONE 20 MG PO TABS: 40.0000 mg | ORAL_TABLET | Freq: Every day | ORAL | Status: DC

## 2023-09-30 MED ORDER — BUSPIRONE HCL 5 MG PO TABS
7.5000 mg | ORAL_TABLET | Freq: Two times a day (BID) | ORAL | Status: DC
Start: 2023-09-30 — End: 2023-10-01
  Administered 2023-09-30 (×2): 7.5 mg via ORAL
  Filled 2023-09-30 (×2): qty 2

## 2023-09-30 MED ORDER — SPIRONOLACTONE 25 MG PO TABS
75.0000 mg | ORAL_TABLET | Freq: Every day | ORAL | Status: DC
  Administered 2023-09-30: 75 mg via ORAL
  Filled 2023-09-30: qty 3

## 2023-09-30 MED ORDER — FLUTICASONE PROPIONATE 50 MCG/ACT NA SUSP
2.0000 | Freq: Every day | NASAL | Status: DC
  Filled 2023-09-30: qty 16

## 2023-09-30 MED ORDER — LORATADINE 10 MG PO TABS
10.0000 mg | ORAL_TABLET | Freq: Every day | ORAL | Status: DC
  Administered 2023-09-30: 10 mg via ORAL
  Filled 2023-09-30: qty 1

## 2023-09-30 MED ORDER — AMLODIPINE-OLMESARTAN 10-40 MG PO TABS: 1.0000 | ORAL_TABLET | Freq: Every day | ORAL | Status: DC

## 2023-09-30 MED ORDER — ASPIRIN 81 MG PO CHEW
324.0000 mg | CHEWABLE_TABLET | Freq: Once | ORAL | Status: AC
  Administered 2023-09-30: 324 mg via ORAL
  Filled 2023-09-30: qty 4

## 2023-09-30 MED ORDER — PANTOPRAZOLE SODIUM 40 MG PO TBEC
40.0000 mg | DELAYED_RELEASE_TABLET | Freq: Every day | ORAL | Status: DC
  Administered 2023-09-30: 40 mg via ORAL
  Filled 2023-09-30: qty 1

## 2023-09-30 MED ORDER — ASPIRIN 81 MG PO TBEC: 81.0000 mg | DELAYED_RELEASE_TABLET | Freq: Every day | ORAL | Status: DC

## 2023-09-30 MED ORDER — EZETIMIBE 10 MG PO TABS
10.0000 mg | ORAL_TABLET | Freq: Every day | ORAL | Status: DC
  Administered 2023-09-30: 10 mg via ORAL
  Filled 2023-09-30: qty 1

## 2023-09-30 MED ORDER — IOHEXOL 350 MG/ML SOLN
75.0000 mL | Freq: Once | INTRAVENOUS | Status: AC | PRN
Start: 2023-09-30 — End: 2023-09-30
  Administered 2023-09-30: 75 mL via INTRAVENOUS

## 2023-09-30 MED ORDER — ALBUTEROL SULFATE (2.5 MG/3ML) 0.083% IN NEBU: 2.5000 mg | INHALATION_SOLUTION | RESPIRATORY_TRACT | Status: DC | PRN

## 2023-09-30 NOTE — Plan of Care (Signed)

## 2023-09-30 NOTE — Consult Note (Addendum)
 Cardiology Consultation   Patient ID: Andrea Lowery MRN: [id number]; DOB: [birth year]  Admit date: 09/30/2023 Date of Consult: 09/30/2023  PCP:  Andrea Leni Edyth DELENA, MD   [facility] Providers Cardiologist:  None   Patient Profile: Andrea Lowery is a 83 y.o. female with a hx of LBBB, asthma followed by allergist, laser ablation of left great saphenous vein 08/2020, MGUS, OSA, hypertension, type 2 diabetes, DVT who is being seen 09/30/2023 for the evaluation of chest pain.  Transferred from [facility].  History of Present Illness: Andrea Lowery has not been seen by cardiology since October 2021.  Per chart review has history of normal stress testing in 2012.  There were issues of noncompliance with BP meds during that time.  Chronically had complaints of lower extremity edema but with normal LVEF.  Suspected to have OSA.  Has CEAP C3 venous disease.  She is followed by vascular surgery for history of laser ablation of left great saphenous vein.  Of note she has been followed by oncology for several years now with history of MGUS.  Prior bone marrow biopsy with 10% plasma cells.  Overall reportedly has not had disease progression and multiple myeloma panel has been stable for years.  Currently patient being evaluated for chest pain, negative troponins.  Initially had some complaints of shortness of breath, wheezing, congestion but with negative viral panel.  She was transferred [facility] for further workup.  Blood pressure is also uncontrolled as high as 201 systolics.  Patient reports that her son and her father have had history of MIs.  She is a known diabetic.  Previous smoker but quit 15+ years ago but was smoking for 10+ years.  Denies any alcohol  or drug use.  She reports that she has been having chest pain for the past year that has been exertional and more progressive for the past 1 to 2 months.  Chest pain arises during mild exertional activity such  as vacuuming or household chores.  At times accompanied with shortness of breath, radiation into her left arm and into her neck.  She describes this as a dull feeling, rates it 5 out of 10.  But also reports reproducible pain with palpation.  Also reports worsening fatigue during same time.  She had an episode of chest pain yesterday while playing with her grandchildren that was worse than usual so sought emergency evaluation.  Also reports cough but followed by allergist for this.  Reports chronic peripheral edema, occasional shortness of breath, chronic orthopnea.  Additionally, blood pressure has been uncontrolled for several years now and she says that she has been stressed and taking care of other people and has not been taking her blood pressure medications but willing to do so now.  D-dimer slightly elevated, CTA with no PE.  Potassium 4.0.  Creatinine 0.84.  Troponins negative x 2.  proBNP 73.8.  Hemoglobin 13.5.  Past Medical History:  Diagnosis Date   Arthritis    Asthma    Asthma 01/10/2016   Last Assessment & Plan:  After recent ED eval- currrently utilizing prednisone  as prescribed. Begin Levaquin , prednisone  and Albuterol  MDI to be utilized as needed for acute wheezing FU in 2 day is no improvement or worsening of symptoms. Pt verbalizes understanding for current treatment plan. Follow-up in one week. Plan to increase chronic inhaled steroid dose to assess further with prevention of   Diabetes mellitus without complication (HCC)    Diabetes II   Difficulty  sleeping 12/28/2013   Last Assessment & Plan:  Stable with trazodone  nightly. Continue current treatment plan. Refill trazodone  today. Tolerating medication well without side effects. Return in 3 months for follow up or sooner if any worsening of symptoms.    DOE (dyspnea on exertion) 07/01/2012   DVT (deep venous thrombosis) (HCC)    Years ago   Dyslipidemia    Eczema    GERD (gastroesophageal reflux disease)    Headache     Migraine   Hypertension    Hypokalemia 04/30/2016   Hypothyroidism    LBBB (left bundle branch block)    MGUS (monoclonal gammopathy of unknown significance)    MGUS (monoclonal gammopathy of unknown significance)    Migraine    OSA on CPAP    Sinus infection    had sinus surgery   Urinary frequency 07/25/2021    Past Surgical History:  Procedure Laterality Date   ABDOMINAL HYSTERECTOMY  1969   ARTERY BIOPSY N/A 11/07/2014   Procedure: BIOPSY TEMPORAL ARTERY;  Surgeon: Daniel Moccasin, MD;  Location: [facility] OR;  Service: ENT;  Laterality: N/A;   CARPAL TUNNEL RELEASE Right    CHOLECYSTECTOMY  1976   ENDOVENOUS ABLATION SAPHENOUS VEIN W/ LASER Left 08/24/2020   endovenous laser ablation left greater saphenous vein by Medford Blade MD   LUMBAR LAMINECTOMY/DECOMPRESSION MICRODISCECTOMY N/A 07/31/2015   Procedure: L4-5 Decompression, Possible Right L4-5 Microdiscectomy;  Surgeon: Oneil JAYSON Herald, MD;  Location: [facility] OR;  Service: Orthopedics;  Laterality: N/A;   NASAL SINUS SURGERY     years ago   NM MYOVIEW  LTD  02/13/2010   No ischemia   PLANTAR FASCIA RELEASE     TONSILLECTOMY  1965   US  ECHOCARDIOGRAPHY  09/20/2008   borderline LVH,mild TR,AOV mildly sclerotic w/ca+ of the leaflets       Scheduled Meds:  Continuous Infusions:  PRN Meds:   Allergies:    Allergies  Allergen Reactions   Bee Venom Anaphylaxis    Yellow jackets Yellow jackets   Penicillins Hives    Social History:   Social History   Socioeconomic History   Marital status: Divorced    Spouse name: Not on file   Number of children: Not on file   Years of education: Not on file   Highest education level: Not on file  Occupational History   Not on file  Tobacco Use   Smoking status: Former    Current packs/day: 0.00    Types: Cigarettes    Quit date: 02/04/2006    Years since quitting: 17.6   Smokeless tobacco: Never  Vaping Use   Vaping status: Never Used  Substance and Sexual Activity   Alcohol  use:  No    Alcohol /week: 0.0 standard drinks of alcohol    Drug use: No   Sexual activity: Not Currently  Other Topics Concern   Not on file  Social History Narrative   Lives with sister in a one story home.  Has one child.  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     Right handed   Caffeine: 24 oz/day of pepsi   Social Drivers of Health   Financial Resource Strain: Low Risk  (07/31/2022)   Overall Financial Resource Strain (CARDIA)    Difficulty of Paying Living Expenses: Not hard at all  Food Insecurity: No Food Insecurity (07/31/2022)   Hunger Vital Sign    Worried About Running Out of Food in the Last Year: Never true    Ran Out of Food in the Last Year: Never true  Transportation Needs: No Transportation Needs (07/31/2022)   PRAPARE - Administrator, Civil Service (Medical): No    Lack of Transportation (Non-Medical): No  Physical Activity: Inactive (07/31/2022)   Exercise Vital Sign    Days of Exercise per Week: 0 days    Minutes of Exercise per Session: 0 min  Stress: Stress Concern Present (07/31/2022)   [employer] of Occupational Health - Occupational Stress Questionnaire    Feeling of Stress : To some extent  Social Connections: Moderately Integrated (07/31/2022)   Social Connection and Isolation Panel    Frequency of Communication with Friends and Family: More than three times a week    Frequency of Social Gatherings with Friends and Family: More than three times a week    Attends Religious Services: More than 4 times per year    Active Member of [employer] or Organizations: Yes    Attends Engineer, structural: More than 4 times per year    Marital Status: Divorced  Intimate Partner Violence: Not At Risk (07/31/2022)   Humiliation, Afraid, Rape, and Kick questionnaire    Fear of Current or Ex-Partner: No    Emotionally Abused: No    Physically Abused: No    Sexually Abused: No    Family History:   Family History  Problem Relation Age of Onset   Dementia Mother     Gallbladder disease Mother    Diabetes Father    Heart failure Father    Hypertension Father    Sleep apnea Father    Kidney disease Father    Dementia Father    Thyroid  disease Sister    Hypertension Sister    Dementia Sister    Healthy Sister      ROS:  Please see the history of present illness.   All other ROS reviewed and negative.     Physical Exam/Data: Vitals:   09/30/23 0815 09/30/23 0915 09/30/23 1000 09/30/23 1336  BP: (!) 122/47 (!) 135/59 (!) 148/98 (!) 191/70  Pulse: (!) 48 (!) 51 63 71  Resp: 15 13 (!) 21 20  Temp:   (!) 97.4 F (36.3 C) 97.6 F (36.4 C)  TempSrc:   Oral Oral  SpO2: 96% 97% 95% 100%  Height:       No intake or output data in the 24 hours ending 09/30/23 1430    05/27/2023    1:38 PM 01/30/2023    8:05 PM 01/20/2023    5:06 PM  Last 3 Weights  Weight (lbs) 184 lb 1.6 oz 180 lb 188 lb 7.9 oz  Weight (kg) 83.507 kg 81.647 kg 85.5 kg     Body mass index is 28.83 kg/m.  General:  Well nourished, well developed, in no acute distress HEENT: normal Neck: no JVD Vascular: No carotid bruits; Distal pulses 2+ bilaterally Cardiac:  normal S1, S2; RRR; no murmur  Lungs:  clear to auscultation bilaterally, wheezing  Abd: soft, nontender, no hepatomegaly  Ext: mild edema Musculoskeletal:  No deformities, BUE and BLE strength normal and equal Skin: warm and dry  Neuro:  CNs 2-12 intact, no focal abnormalities noted Psych:  Normal affect   EKG:  The EKG was personally reviewed and demonstrates: Sinus rhythm heart rate 51.  Chronic left bundle branch block.  First-degree AV block, PR 202.  No acute ST-T wave changes. Telemetry:  Telemetry was personally reviewed and demonstrates: Sinus rhythm  Relevant CV Studies: Echocardiogram 06/24/2023 1. Left ventricular ejection fraction, by estimation, is 50%. The  left  ventricle has low normal function. The left ventricle has no regional wall  motion abnormalities. There is moderate asymmetric left  ventricular  hypertrophy of the basal-septal  segment. Left ventricular diastolic parameters are consistent with Grade I  diastolic dysfunction (impaired relaxation).   2. Right ventricular systolic function is normal. The right ventricular  size is normal. There is normal pulmonary artery systolic pressure. The  estimated right ventricular systolic pressure is 22.7 mmHg.   3. The mitral valve is grossly normal. Trivial mitral valve  regurgitation. No evidence of mitral stenosis.   4. The aortic valve is tricuspid. There is moderate calcification of the  aortic valve. Aortic valve regurgitation is not visualized. Aortic valve  sclerosis/calcification is present, without any evidence of aortic  stenosis.   5. The inferior vena cava is normal in size with greater than 50%  respiratory variability, suggesting right atrial pressure of 3 mmHg.     Laboratory Data: High Sensitivity Troponin:  No results for input(s): TROPONINIHS in the last 720 hours.   Chemistry Recent Labs  Lab 09/29/23 2108 09/30/23 1225  NA 139 138  K 3.0* 4.0  CL 102 101  CO2 26 27  GLUCOSE 144* 159*  BUN <5* 5*  CREATININE 0.88 0.84  CALCIUM  9.5 9.8  GFRNONAA >60 >60  ANIONGAP 12 10    No results for input(s): PROT, ALBUMIN, AST, ALT, ALKPHOS, BILITOT in the last 168 hours. Lipids No results for input(s): CHOL, TRIG, HDL, LABVLDL, LDLCALC, CHOLHDL in the last 168 hours.  Hematology Recent Labs  Lab 09/29/23 2108  WBC 5.0  RBC 4.94  HGB 13.5  HCT 40.0  MCV 81.0  MCH 27.3  MCHC 33.8  RDW 14.6  PLT 185   Thyroid  No results for input(s): TSH, FREET4 in the last 168 hours.  BNP Recent Labs  Lab 09/30/23 0025  PROBNP 73.8    DDimer  Recent Labs  Lab 09/30/23 0051  DDIMER 1.06*    Radiology/Studies:  CT Angio Chest PE W and/or Wo Contrast Result Date: 09/30/2023 CLINICAL DATA:  Positive D-dimer and cough for several days, initial encounter EXAM: CT ANGIOGRAPHY  CHEST WITH CONTRAST TECHNIQUE: Multidetector CT imaging of the chest was performed using the standard protocol during bolus administration of intravenous contrast. Multiplanar CT image reconstructions and MIPs were obtained to evaluate the vascular anatomy. RADIATION DOSE REDUCTION: This exam was performed according to the departmental dose-optimization program which includes automated exposure control, adjustment of the mA and/or kV according to patient size and/or use of iterative reconstruction technique. CONTRAST:  75mL OMNIPAQUE  IOHEXOL  350 MG/ML SOLN COMPARISON:  Chest x-ray from the previous day. FINDINGS: Cardiovascular: Atherosclerotic calcifications of the thoracic aorta are noted. No aneurysmal dilatation is seen. Heart is not significantly enlarged in size. The pulmonary artery shows a normal branching pattern bilaterally. No filling defect to suggest pulmonary embolism is noted. Mild coronary calcifications are noted. Mediastinum/Nodes: Thoracic inlet is within normal limits. No hilar or mediastinal adenopathy is noted. The esophagus as visualized is within normal limits. Lungs/Pleura: Lungs are well aerated bilaterally. A few calcified granulomas are identified. No sizable parenchymal nodules are seen. No focal effusion is noted. No confluent infiltrate is seen. Upper Abdomen: Visualized upper abdomen shows evidence of cholecystectomy. No acute abnormality is noted. Musculoskeletal: Degenerative changes of the thoracic spine are noted. Review of the MIP images confirms the above findings. IMPRESSION: No evidence of pulmonary emboli. Changes of prior granulomatous disease. No acute abnormality noted. Aortic Atherosclerosis (ICD10-I70.0). Electronically  Signed   By: Oneil Devonshire M.D.   On: 09/30/2023 03:10   DG Chest 2 View Result Date: 09/29/2023 CLINICAL DATA:  shortness of breath and cough EXAM: CHEST - 2 VIEW COMPARISON:  Chest x-ray 03/04/2022 FINDINGS: The heart and mediastinal contours are  unchanged. Atherosclerotic plaque. No focal consolidation. No pulmonary edema. Blunting of bilateral costophrenic angles with no definite pleural effusion. No pneumothorax. No acute osseous abnormality. Right upper quadrant surgical clips. IMPRESSION: 1. No active cardiopulmonary disease. 2.  Aortic Atherosclerosis (ICD10-I70.0). Electronically Signed   By: Morgane  Naveau M.D.   On: 09/29/2023 21:44     Assessment and Plan:  Chest pain Chronic LBBB - normal echo 06/2023, mod asymmetric LVH likely related to HTN Presentation is mixed atypical/typical.  She reports concerning features of exertional chest pain with radiation to her neck and left arm sometimes associated with shortness of breath and notes decline in functional capacity over the past 1 to 2 months.  However pain also identical and reproducible with palpation.  She has strong family history of CAD and numerous risk factors that warrant further evaluation.  EKG with no acute ST-T wave changes.  Troponins negative x 2.  Will plan for CCTA likely tomorrow.  HR in the 50s currently. NPO midnight.   Hypertension Uncontrolled and she has not been taking her medications.  Systolics as high as 201 with labile readings throughout this admission.  She is amenable now to start taking blood pressure meds.  Will not make any changes currently so we can see how she does on current regiment Currently on amlodipine -olmesartan  10-40 mg daily, HCTZ 25 mg, Toprol -XL 50 mg, spironolactone  75 mg. If she continues to exhibit uncontrolled readings while maintaining compliancy we will need to look for other secondary etiologies.  MGUS Followed by oncology, reportedly stable disease without any progression.  OSA Documented remotely.  Do not think she is on CPAP but need to reassess.  Echocardiogram May 2025 without any pulmonary hypertension.  Type 2 diabetes A1c 7.4% 1 year ago. On Jardiance  25 mg  Carotid artery stenosis Moderate stenosis 2018,  stable from previous. Needs to repeated, can do outpatient.  Should be on aspirin , will start.  Also will check lipid panel. Not on statin. Add after.   Cough/SOB History of allergies and followed by allergist.  Viral panel negative.  Risk Assessment/Risk Scores:  For questions or updates, please contact [facility] Please consult [email or web address] for contact info under    Signed, Thom LITTIE Sluder, PA-C  09/30/2023 2:30 PM   I have seen and examined the patient along with Thom LITTIE Sluder, PA-C .  I have reviewed the chart, notes and new data.  I agree with PA/NP's note.  Key new complaints: Describes retrosternal chest tightness concerning for angina pectoris, after she had been playing around with her great-grandchildren.  Symptoms lasted for approximately 5 minutes, but the discomfort was severe.  Also has been having symptoms of an upper respiratory infection with a lot of discolored secretions from her nose, cough with thick sputum production.  Has not had fever or chills.  He has not smoked in 20 or 30 years, but is exposed to passive cigarette smoke from her nephew. Key examination changes: A few rhonchi in both lung fields, but otherwise clear.  Regular rate and rhythm, no murmurs rubs or gallops.  Localized edema lateral retromalleolar area bilaterally. Key new findings / data: Previous carotid duplex ultrasound shows moderate carotid stenosis.  Chest CT shows mild  coronary atherosclerosis and aortic atherosclerosis.  Has numerous risk factors for vascular disease including type 2 diabetes mellitus (well treated) and hypertension (not well-controlled) and relatively severe hypercholesterolemia (LDL 163 a year ago).  ECG is nondiagnostic due to LBBB which has been present for at least the last 5 years.  Normal high-sensitivity troponin, repeated.  PLAN: Suspicion remains for significant coronary artery insufficiency, possible unstable angina, thankfully with normal biomarkers. Has  received contrast load for CT angiogram/pulmonary embolism protocol today and has diabetes mellitus.  Even with normal renal function, would like to delay additional contrast exposure until tomorrow.  Plan coronary CT angiogram in the morning. Will work on better compliance with antihypertensive and lipid-lowering medications while she is here.  Lai Hendriks, MD, FACC [facility] [phone] 09/30/2023, 4:14 PM

## 2023-09-30 NOTE — Assessment & Plan Note (Signed)
 Recent Echo EF 50%, grade I DD and asymmetric septal hypertrophy Not on diuretic at baseline.  No edema or effusions on CXR.  There was some misunderstanding that she has LE edema, she tells me clearly her legs are same as they always are, they are just beefy, without pitting or edema at all. - Consult Cardiology - Continue Jardiance , antihypertensives

## 2023-09-30 NOTE — ED Notes (Signed)
Lauren with cl called for transport 

## 2023-09-30 NOTE — Assessment & Plan Note (Addendum)
 Glucose controlled - Continue Jardiance  - Low dose SSI

## 2023-09-30 NOTE — Hospital Course (Addendum)
 83 y.o. F with asthma on Symbicort  and Fasenra , HTN, DM, hypothyroidism and MGUS who presented with respiratory symptoms, maybe for 1-2 weeks.  History provided is somewhat lacking in coherence.  Family member with her is not table to fill in details.  Evidently about 1-2 weeks ago, started to notice staggering (by which she clarifies she means she had trouble catching her breath), forgetting words to church hymns and dizziness.  This progressed to a sense of malaise, then this progressed to feeling more wheezy and clearly short of breath until she went to Urgent Care.  In Urgent Care, COVID/flu negative.  She was sent to the ER because she will require further workup if pneumonia is present to determine if she can safely be discharged home.  In the ER, CTA chest ruled out PE, edema, effusions, pneumonia, lytic lesions of the bone.  On exam, she was wheezing and coughing. HsTrop negative, EKG showed stable LBBB.  EDP gave aspirin  325 mg, consulted Cardiology for chest pain and transferred the patient for admission.

## 2023-09-30 NOTE — Assessment & Plan Note (Signed)
 Fasenra  was cut off by her insurance two months ago.  Symptoms of wheezing, cough and SOB started a few weeks ago.    Now very wheezy on exam, O2 normal - Start Solumedrol - Schedule BDs - Continue home ICS/LABA - RX auth team consult re: Fasenra  - Follow up with Dr. Kozlow

## 2023-09-30 NOTE — Assessment & Plan Note (Signed)
Follows with Dr. Gorsuch.  

## 2023-09-30 NOTE — Plan of Care (Signed)

## 2023-09-30 NOTE — ED Notes (Addendum)
 Pt HR in the 50s, sinus brady while sleeping. Rancour MD notified. Pt asymptomatic, EKG transmitted.

## 2023-09-30 NOTE — H&P (Signed)
 History and Physical    Patient: Andrea Lowery FMW:[id number] DOB: [birth year] DOA: 09/30/2023 DOS: the patient was seen and examined on 09/30/2023 PCP: Maree Leni Edyth DELENA, MD  Patient coming from: Home  Chief Complaint:  Chief Complaint  Patient presents with   Chest Pain   Shortness of Breath   Leg Swelling       HPI:  83 y.o. F with asthma on Symbicort  and Fasenra , HTN, DM, hypothyroidism and MGUS who presented with respiratory symptoms, maybe for 1-2 weeks.  History provided is somewhat lacking in coherence.  Family member with her is not table to fill in details.  Evidently about 1-2 weeks ago, started to notice staggering (by which she clarifies she means she had trouble catching her breath), forgetting words to church hymns and dizziness.  This progressed to a sense of malaise, then this progressed to feeling more wheezy and clearly short of breath until she went to Urgent Care.  In Urgent Care, COVID/flu negative.  She was sent to the ER because she will require further workup if pneumonia is present to determine if she can safely be discharged home.  In the ER, CTA chest ruled out PE, edema, effusions, pneumonia, lytic lesions of the bone.  On exam, she was wheezing and coughing. HsTrop negative, EKG showed stable LBBB.  EDP gave aspirin  325 mg, consulted Cardiology for chest pain and transferred the patient for admission.      Review of Systems  Constitutional:  Positive for malaise/fatigue. Negative for chills, diaphoresis, fever and weight loss.  HENT:  Negative for congestion.   Respiratory:  Positive for cough, shortness of breath and wheezing. Negative for hemoptysis and sputum production.   Cardiovascular:  Positive for chest pain. Negative for palpitations, orthopnea, claudication, leg swelling and PND.  Skin:  Negative for itching and rash.  Neurological:  Positive for dizziness.  All other systems reviewed and are negative.    Past Medical History:   Diagnosis Date   Arthritis    Asthma    Asthma 01/10/2016   Last Assessment & Plan:  After recent ED eval- currrently utilizing prednisone  as prescribed. Begin Levaquin , prednisone  and Albuterol  MDI to be utilized as needed for acute wheezing FU in 2 day is no improvement or worsening of symptoms. Pt verbalizes understanding for current treatment plan. Follow-up in one week. Plan to increase chronic inhaled steroid dose to assess further with prevention of   Diabetes mellitus without complication (HCC)    Diabetes II   Difficulty sleeping 12/28/2013   Last Assessment & Plan:  Stable with trazodone  nightly. Continue current treatment plan. Refill trazodone  today. Tolerating medication well without side effects. Return in 3 months for follow up or sooner if any worsening of symptoms.    DOE (dyspnea on exertion) 07/01/2012   DVT (deep venous thrombosis) (HCC)    Years ago   Dyslipidemia    Eczema    GERD (gastroesophageal reflux disease)    Headache    Migraine   Hypertension    Hypokalemia 04/30/2016   Hypothyroidism    LBBB (left bundle branch block)    MGUS (monoclonal gammopathy of unknown significance)    MGUS (monoclonal gammopathy of unknown significance)    Migraine    OSA on CPAP    Sinus infection    had sinus surgery   Urinary frequency 07/25/2021   Past Surgical History:  Procedure Laterality Date   ABDOMINAL HYSTERECTOMY  1969   ARTERY BIOPSY N/A 11/07/2014   Procedure:  BIOPSY TEMPORAL ARTERY;  Surgeon: Daniel Moccasin, MD;  Location: [facility] OR;  Service: ENT;  Laterality: N/A;   CARPAL TUNNEL RELEASE Right    CHOLECYSTECTOMY  1976   ENDOVENOUS ABLATION SAPHENOUS VEIN W/ LASER Left 08/24/2020   endovenous laser ablation left greater saphenous vein by Medford Blade MD   LUMBAR LAMINECTOMY/DECOMPRESSION MICRODISCECTOMY N/A 07/31/2015   Procedure: L4-5 Decompression, Possible Right L4-5 Microdiscectomy;  Surgeon: Oneil JAYSON Herald, MD;  Location: [facility] OR;  Service: Orthopedics;   Laterality: N/A;   NASAL SINUS SURGERY     years ago   NM MYOVIEW  LTD  02/13/2010   No ischemia   PLANTAR FASCIA RELEASE     TONSILLECTOMY  1965   US  ECHOCARDIOGRAPHY  09/20/2008   borderline LVH,mild TR,AOV mildly sclerotic w/ca+ of the leaflets   Social History:  reports that she quit smoking about 17 years ago. Her smoking use included cigarettes. She has never used smokeless tobacco. She reports that she does not drink alcohol  and does not use drugs.  Allergies  Allergen Reactions   Bee Venom Anaphylaxis    Yellow jackets Yellow jackets   Penicillins Hives    Family History  Problem Relation Age of Onset   Dementia Mother    Gallbladder disease Mother    Diabetes Father    Heart failure Father    Hypertension Father    Sleep apnea Father    Kidney disease Father    Dementia Father    Thyroid  disease Sister    Hypertension Sister    Dementia Sister    Healthy Sister     Prior to Admission medications   Medication Sig Start Date End Date Taking? Authorizing Provider  acetaminophen -codeine  (TYLENOL  #3) 300-30 MG tablet Take 1 tablet by mouth every 8 (eight) hours as needed. 03/20/23   [provider]  albuterol  (VENTOLIN  HFA) 108 (90 Base) MCG/ACT inhaler INHALE 2 PUFFS EVERY 4 HOURS AS NEEDED FOR WHEEZING OR SHORTNESS OF BREATH 08/05/22   Ambs, Arlean HERO, FNP  amLODipine -olmesartan  (AZOR ) 10-40 MG tablet TAKE 1 TABLET EVERY DAY Patient not taking: Reported on 09/29/2023 06/14/22   Karna Fellows, MD  Benralizumab  (FASENRA  PEN) 30 MG/ML SOAJ Inject 1 mL (30 mg total) into the skin every 8 (eight) weeks. 07/04/22   Ambs, Arlean HERO, FNP  benzonatate  (TESSALON ) 100 MG capsule Take 1 capsule (100 mg total) by mouth every 8 (eight) hours. Patient not taking: Reported on 09/29/2023 03/16/23   Teresa Norris A, PA-C  Blood Glucose Monitoring Suppl (TRUE METRIX METER) w/Device KIT Use as instructed 05/22/21   Ariwodo, Shirley, MD  budesonide -formoterol  (SYMBICORT ) 160-4.5 MCG/ACT  inhaler INHALE 2 PUFFS BY MOUTH INTO THE LUNGS TWICE DAILY RINSE GARGLE AND SPIT AFTER USE 11/26/22   Kozlow, Eric J, MD  budesonide -formoterol  (SYMBICORT ) 160-4.5 MCG/ACT inhaler 2 inhalations 2 times per day w/ spacer 05/27/23   Kozlow, Camellia PARAS, MD  busPIRone  (BUSPAR ) 7.5 MG tablet Take 7.5 mg by mouth 2 (two) times daily. 01/10/23   [provider]  cetirizine  (ZYRTEC ) 10 MG tablet Take 1 tablet (10 mg total) by mouth daily as needed for allergies (Can take an extra dose during flare ups.). 11/26/22   Kozlow, Camellia PARAS, MD  Dextrose , Diabetic Use, (RELION GLUCOSE PO) USE AS DIRECTED TO MEASURE BLOOD GLUCOSE UP TO ONE TIME DAILY Patient not taking: Reported on 09/29/2023 01/14/23   [provider]  diazepam  (VALIUM ) 2 MG tablet Take 2 mg by mouth daily as needed. Patient not taking: Reported on  09/29/2023 05/22/23   [provider]  diclofenac  Sodium (VOLTAREN  ARTHRITIS PAIN) 1 % GEL Apply 2 g topically 4 (four) times daily. 04/17/23   Raspet, Erin K, PA-C  DULoxetine  (CYMBALTA ) 30 MG capsule Take 1 capsule (30 mg total) by mouth daily. Patient not taking: Reported on 09/29/2023 08/23/22   Zheng, Michael, DO  empagliflozin  (JARDIANCE ) 25 MG TABS tablet Take 1 tablet (25 mg total) by mouth daily before breakfast. 09/12/22   Karna Fellows, MD  EPINEPHrine  0.3 mg/0.3 mL IJ SOAJ injection Inject 0.3 mg into the muscle as needed for anaphylaxis. Use as directed for severe allergic reaction 05/27/23   Kozlow, Camellia PARAS, MD  esomeprazole  (NEXIUM ) 40 MG capsule Take 1 capsule (40 mg total) by mouth 2 (two) times daily. 05/27/23   Kozlow, Camellia PARAS, MD  ezetimibe  (ZETIA ) 10 MG tablet TAKE 1 TABLET EVERY DAY Patient not taking: Reported on 09/29/2023 11/11/22   Karna Fellows, MD  Fluocinolone  Acetonide Scalp (DERMA-SMOOTHE JANA SCALP) 0.01 % OIL Apply 1-7 times a week Patient not taking: Reported on 09/29/2023 05/28/23   Kozlow, Eric J, MD  fluticasone  (FLONASE ) 50 MCG/ACT nasal spray Place 2 sprays into both  nostrils daily. 05/27/23   Kozlow, Eric J, MD  glucose blood (TRUE METRIX BLOOD GLUCOSE TEST) test strip USE AS DIRECTED 03/19/22   Karna Fellows, MD  hydrochlorothiazide  (HYDRODIURIL ) 25 MG tablet Take 25 mg by mouth every morning. Patient not taking: Reported on 09/29/2023 07/28/23   [provider]  hydrOXYzine  (ATARAX ) 25 MG tablet Take 25 mg by mouth 3 (three) times daily as needed. Patient not taking: Reported on 09/29/2023 05/22/23   [provider]  levothyroxine  (SYNTHROID ) 50 MCG tablet TAKE 1 TABLET EVERY DAY, AT LEAST 30-60 MINUTES BEFORE OTHER FOOD OR MEDICINE. 08/12/22   Karna Fellows, MD  metoprolol  succinate (TOPROL  XL) 50 MG 24 hr tablet Take 1 tablet (50 mg total) by mouth daily. Take with or immediately following a meal. Patient not taking: Reported on 04/17/2023 07/24/21 11/26/22  Lou Claretta HERO, MD  montelukast  (SINGULAIR ) 10 MG tablet Take 1 tablet (10 mg total) by mouth at bedtime. 05/27/23   Kozlow, Camellia PARAS, MD  Olopatadine -Mometasone  (RYALTRIS ) 665-25 MCG/ACT SUSP Place 2 sprays into the nose 2 (two) times daily. Patient not taking: Reported on 09/29/2023 11/26/22   Kozlow, Eric J, MD  rosuvastatin  (CRESTOR ) 40 MG tablet TAKE 1 TABLET EVERY DAY (NEED MD APPOINTMENT FOR REFILLS) 11/01/22   Karna Fellows, MD  spironolactone  (ALDACTONE ) 50 MG tablet Take 1.5 tablets (75 mg total) by mouth daily. Patient not taking: Reported on 09/29/2023 01/13/23   Karna Fellows, MD    Physical Exam: Vitals:   09/30/23 0815 09/30/23 0915 09/30/23 1000 09/30/23 1336  BP: (!) 122/47 (!) 135/59 (!) 148/98 (!) 191/70  Pulse: (!) 48 (!) 51 63 71  Resp: 15 13 (!) 21 20  Temp:   (!) 97.4 F (36.3 C) 97.6 F (36.4 C)  TempSrc:   Oral Oral  SpO2: 96% 97% 95% 100%  Height:       General appearance:   alert and in no distress.  Responds appropriately to questions.  Eye contact, dress and hygiene appropriate. HEENT:  Anicteric, conjunctivae and sclerae normal without injection or icterus, lids and  lashes normal.  Visual tracking smooth.  OP moist without lesions, dentures in place, lips normal, normal auditory acuity   Cor:  RRR, without murmurs, rubs.  JVP normal, no LE edema Resp:  Normal respiratory rate and rhythm. Comfortable  on room air.  Coarse wheezing bilaterally, no rales.  Good air movement.   No dullness at bases. Abd:  No TTP or rebound all quadrants.  No masses or organomegaly.   No ascites, distension.  MSK: seem symmetrical and normal for age without gross deformities of the hands, large joints, or legs. Skin:  cap refill normal, Skin intact without significant rashes or lesions. Neuro:  Speech is fluent.  Short term memory seems impaired, but oriented x3, upper extremity and lower extremity sterngth 5/5 with normal muscle tone for age.  Face symmetrci, speech fluent   Psych:  Attention span and concentration are within normal limits.  Affect normal.  appropriate thought content and normal rate of speech, thought process linear           Data Reviewed: Discussed with Cardiology ECG personally reviewed, shows old LBBB CTA chest report reviewed, shows no PE, pneumonia, effusion, pulmonary edema, pneumothorax, bone lesion Basic metabolic panel shows hypokalemia, now resolved, normal renal function CBC unremarkable    Assessment and Plan: * Asthma, chronic, unspecified asthma severity, with acute exacerbation Fasenra  was cut off by her insurance two months ago.  Symptoms of wheezing, cough and SOB started a few weeks ago.    Now very wheezy on exam, O2 normal - Start Solumedrol - Schedule BDs - Continue home ICS/LABA - RX auth team consult re: Fasenra  - Follow up with Dr. Kozlow    Hypothyroidism - Continue LT4 - Check TSH  Chronic diastolic CHF (congestive heart failure) (HCC) Recent Echo EF 50%, grade I DD and asymmetric septal hypertrophy Not on diuretic at baseline.  No edema or effusions on CXR.  There was some misunderstanding that she has LE edema,  she tells me clearly her legs are same as they always are, they are just beefy, without pitting or edema at all. - Consult Cardiology - Continue Jardiance , antihypertensives   Type 2 diabetes mellitus with hyperglycemia (HCC) Glucose controlled - Continue Jardiance  - Low dose SSI  MGUS (monoclonal gammopathy of unknown significance) Follows with Dr. Lonn  Essential hypertension Hypertensive urgency On 5 drug therapy.  Reports to me that she needs refills.  Also that she is mostly nonadherent to these.   - Resume amlodipine , hydrochlorothiazide , irbesartan , metoprolol  and spironolactone  - Needs refills at d/c  Chest pain This is a passing complaint in her history to me.  HsTrop below threshold.   - Defer ischemic work up to Cardiology  Hypokalemia Supplemented and resolved     Advance Care Planning: FULL  Consults: Cardiology< Dr Therese  Family Communication: Cousin at bedside  Severity of Illness: The appropriate patient status for this patient is OBSERVATION. Observation status is judged to be reasonable and necessary in order to provide the required intensity of service to ensure the patient's safety. The patient's presenting symptoms, physical exam findings, and initial radiographic and laboratory data in the context of their medical condition is felt to place them at decreased risk for further clinical deterioration. Furthermore, it is anticipated that the patient will be medically stable for discharge from the hospital within 2 midnights of admission.   Author: Lonni SHAUNNA Dalton, MD 09/30/2023 3:32 PM  For on call review [email or web address].

## 2023-09-30 NOTE — Assessment & Plan Note (Signed)
 Hypertensive urgency On 5 drug therapy.  Reports to me that she needs refills.  Also that she is mostly nonadherent to these.   - Resume amlodipine , hydrochlorothiazide , irbesartan , metoprolol  and spironolactone  - Needs refills at d/c

## 2023-09-30 NOTE — ED Provider Notes (Signed)
 [place] EMERGENCY DEPARTMENT AT [facility] Provider Note   CSN: [id number] Arrival date & time: 09/29/23  2057     Patient presents with: Chest Pain, Shortness of Breath, and Leg Swelling   Andrea Lowery is a 83 y.o. female.   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  Symptoms ongoing for the past 1 day.  Describes central chest pain that radiates to her neck associate with shortness of breath.  No chest pain currently.  Some increased cough and congestion.  Seen in urgent care and referred to the ED.  Not having any chest pain currently.  Is somewhat worse with exertion.  Associated with coughing and shortness of breath.  No diaphoresis, nausea or vomiting or sweating.  Reports remote history of stress test.  Denies any CAD known history.  No abdominal pain, vomiting.  No fever  The history is provided by the patient.  Chest Pain Associated symptoms: cough and shortness of breath   Associated symptoms: no dizziness, no fever, no headache, no nausea, no vomiting and no weakness   Shortness of Breath Associated symptoms: chest pain and cough   Associated symptoms: no fever, no headaches, no rash and no vomiting        Prior to Admission medications   Medication Sig Start Date End Date Taking? Authorizing Provider  acetaminophen -codeine  (TYLENOL  #3) 300-30 MG tablet Take 1 tablet by mouth every 8 (eight) hours as needed. 03/20/23   [provider]  albuterol  (VENTOLIN  HFA) 108 (90 Base) MCG/ACT inhaler INHALE 2 PUFFS EVERY 4 HOURS AS NEEDED FOR WHEEZING OR SHORTNESS OF BREATH 08/05/22   Ambs, Arlean HERO, FNP  amLODipine  (NORVASC ) 5 MG tablet Take 5 mg by mouth daily. 06/26/23   [provider]  amLODipine -olmesartan  (AZOR ) 10-40 MG tablet TAKE 1 TABLET EVERY DAY Patient not taking: Reported on 09/29/2023 06/14/22   Karna Fellows, MD  Benralizumab  (FASENRA  PEN) 30  MG/ML SOAJ Inject 1 mL (30 mg total) into the skin every 8 (eight) weeks. 07/04/22   Cari Arlean HERO, FNP  benzonatate  (TESSALON ) 100 MG capsule Take 1 capsule (100 mg total) by mouth every 8 (eight) hours. Patient not taking: Reported on 09/29/2023 03/16/23   Teresa Norris A, PA-C  Blood Glucose Monitoring Suppl (TRUE METRIX METER) w/Device KIT Use as instructed 05/22/21   Ariwodo, Shirley, MD  budesonide -formoterol  (SYMBICORT ) 160-4.5 MCG/ACT inhaler INHALE 2 PUFFS BY MOUTH INTO THE LUNGS TWICE DAILY RINSE GARGLE AND SPIT AFTER USE 11/26/22   Kozlow, Camellia PARAS, MD  budesonide -formoterol  (SYMBICORT ) 160-4.5 MCG/ACT inhaler 2 inhalations 2 times per day w/ spacer 05/27/23   Kozlow, Camellia PARAS, MD  busPIRone  (BUSPAR ) 7.5 MG tablet Take 7.5 mg by mouth 2 (two) times daily. 01/10/23   [provider]  cetirizine  (ZYRTEC ) 10 MG tablet Take 1 tablet (10 mg total) by mouth daily as needed for allergies (Can take an extra dose during flare ups.). 11/26/22   Kozlow, Camellia PARAS, MD  clindamycin  (CLEOCIN ) 300 MG capsule Take 300 mg by mouth every 8 (eight) hours. Patient not taking: Reported on 09/29/2023 06/26/23   [provider]  Dextrose , Diabetic Use, (RELION GLUCOSE PO) USE AS DIRECTED TO MEASURE BLOOD GLUCOSE UP TO ONE TIME DAILY Patient not taking: Reported on 09/29/2023 01/14/23   [provider]  diazepam  (VALIUM ) 2 MG tablet Take 2 mg by mouth daily as needed. Patient not taking: Reported on 09/29/2023 05/22/23   [provider]  diclofenac  Sodium (VOLTAREN  ARTHRITIS PAIN) 1 % GEL Apply 2 g topically 4 (four) times daily. 04/17/23   Raspet, Erin K, PA-C  DULoxetine  (CYMBALTA ) 30 MG capsule Take 1 capsule (30 mg total) by mouth daily. Patient not taking: Reported on 09/29/2023 08/23/22   Zheng, Michael, DO  empagliflozin  (JARDIANCE ) 25 MG TABS tablet Take 1 tablet (25 mg total) by mouth daily before breakfast. 09/12/22   Karna Fellows, MD  EPINEPHrine  0.3 mg/0.3 mL IJ SOAJ injection Inject 0.3  mg into the muscle as needed for anaphylaxis. Use as directed for severe allergic reaction 05/27/23   Kozlow, Camellia PARAS, MD  esomeprazole  (NEXIUM ) 40 MG capsule Take 1 capsule (40 mg total) by mouth 2 (two) times daily. 05/27/23   Kozlow, Camellia PARAS, MD  ezetimibe  (ZETIA ) 10 MG tablet TAKE 1 TABLET EVERY DAY Patient not taking: Reported on 09/29/2023 11/11/22   Karna Fellows, MD  Fluocinolone  Acetonide Scalp (DERMA-SMOOTHE JANA SCALP) 0.01 % OIL Apply 1-7 times a week Patient not taking: Reported on 09/29/2023 05/28/23   Kozlow, Eric J, MD  fluticasone  (FLONASE ) 50 MCG/ACT nasal spray Place 2 sprays into both nostrils daily. 05/27/23   Kozlow, Eric J, MD  glucose blood (TRUE METRIX BLOOD GLUCOSE TEST) test strip USE AS DIRECTED 03/19/22   Karna Fellows, MD  hydrALAZINE  (APRESOLINE ) 25 MG tablet Take 25 mg by mouth 2 (two) times daily. 03/20/23   [provider]  hydrochlorothiazide  (HYDRODIURIL ) 25 MG tablet Take 25 mg by mouth every morning. Patient not taking: Reported on 09/29/2023 07/28/23   [provider]  hydrOXYzine  (ATARAX ) 25 MG tablet Take 25 mg by mouth 3 (three) times daily as needed. Patient not taking: Reported on 09/29/2023 05/22/23   [provider]  levothyroxine  (SYNTHROID ) 50 MCG tablet TAKE 1 TABLET EVERY DAY, AT LEAST 30-60 MINUTES BEFORE OTHER FOOD OR MEDICINE. 08/12/22   Karna Fellows, MD  metoprolol  succinate (TOPROL  XL) 50 MG 24 hr tablet Take 1 tablet (50 mg total) by mouth daily. Take with or immediately following a meal. Patient not taking: Reported on 04/17/2023 07/24/21 11/26/22  Lou Claretta HERO, MD  montelukast  (SINGULAIR ) 10 MG tablet Take 1 tablet (10 mg total) by mouth at bedtime. 05/27/23   Kozlow, Camellia PARAS, MD  Olopatadine -Mometasone  (RYALTRIS ) 665-25 MCG/ACT SUSP Place 2 sprays into the nose 2 (two) times daily. Patient not taking: Reported on 09/29/2023 11/26/22   Maurilio Camellia PARAS, MD  rosuvastatin  (CRESTOR ) 40 MG tablet TAKE 1 TABLET EVERY DAY (NEED MD APPOINTMENT FOR  REFILLS) 11/01/22   Karna Fellows, MD  [facility] injection Inject 0.5 mLs into the muscle once. 11/05/22   [provider]  spironolactone  (ALDACTONE ) 25 MG tablet Take 25 mg by mouth 2 (two) times daily. Patient not taking: Reported on 09/29/2023 11/01/22   [provider]  spironolactone  (ALDACTONE ) 50 MG tablet Take 1.5 tablets (75 mg total) by mouth daily. Patient not taking: Reported on 09/29/2023 01/13/23   Karna Fellows, MD    Allergies: Bee venom and Penicillins    Review of Systems  Constitutional:  Negative for activity change, appetite change and fever.  HENT:  Negative for congestion and rhinorrhea.   Respiratory:  Positive for cough, chest tightness and shortness of breath.   Cardiovascular:  Positive for chest pain.  Gastrointestinal:  Negative for nausea and vomiting.  Genitourinary:  Negative for dysuria and hematuria.  Musculoskeletal:  Negative for arthralgias and myalgias.  Skin:  Negative for rash.  Neurological:  Negative for dizziness,  weakness and headaches.    all other systems are negative except as noted in the HPI and PMH.   Updated Vital Signs BP (!) 148/84   Pulse 79   Temp 98.7 F (37.1 C) (Oral)   Resp 13   Ht 5' 7 (1.702 m)   SpO2 100%   BMI 28.83 kg/m   Physical Exam Vitals and nursing note reviewed.  Constitutional:      General: She is not in acute distress.    Appearance: She is well-developed.  HENT:     Head: Normocephalic and atraumatic.     Mouth/Throat:     Pharynx: No oropharyngeal exudate.  Eyes:     Conjunctiva/sclera: Conjunctivae normal.     Pupils: Pupils are equal, round, and reactive to light.  Neck:     Comments: No meningismus. Cardiovascular:     Rate and Rhythm: Normal rate and regular rhythm.     Heart sounds: Normal heart sounds. No murmur heard. Pulmonary:     Effort: Pulmonary effort is normal. No respiratory distress.     Breath sounds: Normal breath sounds.  Chest:     Chest wall: No tenderness.   Abdominal:     Palpations: Abdomen is soft.     Tenderness: There is no abdominal tenderness. There is no guarding or rebound.  Musculoskeletal:        General: No tenderness. Normal range of motion.     Cervical back: Normal range of motion and neck supple.     Right lower leg: No edema.     Left lower leg: No edema.  Skin:    General: Skin is warm.  Neurological:     Mental Status: She is alert and oriented to person, place, and time.     Cranial Nerves: No cranial nerve deficit.     Motor: No abnormal muscle tone.     Coordination: Coordination normal.     Comments:  5/5 strength throughout. CN 2-12 intact.Equal grip strength.   Psychiatric:        Behavior: Behavior normal.     (all labs ordered are listed, but only abnormal results are displayed) Labs Reviewed  BASIC METABOLIC PANEL WITH GFR - Abnormal; Notable for the following components:      Result Value   Potassium 3.0 (*)    Glucose, Bld 144 (*)    BUN <5 (*)    All other components within normal limits  D-DIMER, QUANTITATIVE (NOT AT [facility]) - Abnormal; Notable for the following components:   D-Dimer, Quant 1.06 (*)    All other components within normal limits  CBC  PRO BRAIN NATRIURETIC PEPTIDE  TROPONIN T, HIGH SENSITIVITY  TROPONIN T, HIGH SENSITIVITY    EKG: EKG Interpretation Date/Time:  Monday September 29 2023 21:06:52 EDT Ventricular Rate:  84 PR Interval:  168 QRS Duration:  140 QT Interval:  402 QTC Calculation: 475 R Axis:   45  Text Interpretation: Normal sinus rhythm Left bundle branch block Abnormal ECG When compared with ECG of 04-Mar-2022 21:08, No significant change was found No significant change was found Confirmed by Carita Senior [phone]) on 09/30/2023 12:31:00 AM  Radiology: CT Angio Chest PE W and/or Wo Contrast Result Date: 09/30/2023 CLINICAL DATA:  Positive D-dimer and cough for several days, initial encounter EXAM: CT ANGIOGRAPHY CHEST WITH CONTRAST TECHNIQUE: Multidetector CT  imaging of the chest was performed using the standard protocol during bolus administration of intravenous contrast. Multiplanar CT image reconstructions and MIPs were obtained to evaluate  the vascular anatomy. RADIATION DOSE REDUCTION: This exam was performed according to the departmental dose-optimization program which includes automated exposure control, adjustment of the mA and/or kV according to patient size and/or use of iterative reconstruction technique. CONTRAST:  75mL OMNIPAQUE  IOHEXOL  350 MG/ML SOLN COMPARISON:  Chest x-ray from the previous day. FINDINGS: Cardiovascular: Atherosclerotic calcifications of the thoracic aorta are noted. No aneurysmal dilatation is seen. Heart is not significantly enlarged in size. The pulmonary artery shows a normal branching pattern bilaterally. No filling defect to suggest pulmonary embolism is noted. Mild coronary calcifications are noted. Mediastinum/Nodes: Thoracic inlet is within normal limits. No hilar or mediastinal adenopathy is noted. The esophagus as visualized is within normal limits. Lungs/Pleura: Lungs are well aerated bilaterally. A few calcified granulomas are identified. No sizable parenchymal nodules are seen. No focal effusion is noted. No confluent infiltrate is seen. Upper Abdomen: Visualized upper abdomen shows evidence of cholecystectomy. No acute abnormality is noted. Musculoskeletal: Degenerative changes of the thoracic spine are noted. Review of the MIP images confirms the above findings. IMPRESSION: No evidence of pulmonary emboli. Changes of prior granulomatous disease. No acute abnormality noted. Aortic Atherosclerosis (ICD10-I70.0). Electronically Signed   By: Oneil Devonshire M.D.   On: 09/30/2023 03:10   DG Chest 2 View Result Date: 09/29/2023 CLINICAL DATA:  shortness of breath and cough EXAM: CHEST - 2 VIEW COMPARISON:  Chest x-ray 03/04/2022 FINDINGS: The heart and mediastinal contours are unchanged. Atherosclerotic plaque. No focal  consolidation. No pulmonary edema. Blunting of bilateral costophrenic angles with no definite pleural effusion. No pneumothorax. No acute osseous abnormality. Right upper quadrant surgical clips. IMPRESSION: 1. No active cardiopulmonary disease. 2.  Aortic Atherosclerosis (ICD10-I70.0). Electronically Signed   By: Morgane  Naveau M.D.   On: 09/29/2023 21:44     Procedures   Medications Ordered in the ED  ipratropium-albuterol  (DUONEB) 0.5-2.5 (3) MG/3ML nebulizer solution 3 mL (3 mLs Nebulization Given 09/29/23 2126)  albuterol  (PROVENTIL ) (2.5 MG/3ML) 0.083% nebulizer solution 2.5 mg (2.5 mg Nebulization Given 09/29/23 2127)                                    Medical Decision Making Amount and/or Complexity of Data Reviewed Labs: ordered. Decision-making details documented in ED Course. Radiology: ordered and independent interpretation performed. Decision-making details documented in ED Course. ECG/medicine tests: ordered and independent interpretation performed. Decision-making details documented in ED Course.  Risk OTC drugs. Prescription drug management. Decision regarding hospitalization.   Intermittent chest tightness with shortness of breath worse with exertion.  No difficulty breathing currently.  EKG is left bundle branch block, unchanged from previous.  No acute ST changes  Heart score is 5-6.  EKG without acute ST changes.  Blood pressure well-controlled.  Aspirin  given.  Troponin negative.  D-dimer is positive.  At urgent care patient was describing more chest congestion and coughing and wheezing.  Here she is describing exertional chest pain not associated with shortness of breath, coughing or wheezing.  Discussed with cardiology Dr. Donnel who agrees would be reasonable to plan stress test in the morning given her risk factors.  Admission discussed with Dr. Lenon.  CT scan negative for pulmonary embolism or pneumonia.     Final diagnoses:  None    ED  Discharge Orders     None          Shakerra Red, Garnette, MD 09/30/23 [phone]

## 2023-09-30 NOTE — Assessment & Plan Note (Signed)
-   Continue LT4 - Check TSH

## 2023-10-01 ENCOUNTER — Observation Stay (HOSPITAL_COMMUNITY)
Admit: 2023-10-01 | Discharge: 2023-10-01 | Disposition: A | Discharge status: Home / Self Care | Attending: Cardiology | Admitting: Cardiology

## 2023-10-01 ENCOUNTER — Observation Stay (HOSPITAL_BASED_OUTPATIENT_CLINIC_OR_DEPARTMENT_OTHER)
Admit: 2023-10-01 | Discharge: 2023-10-01 | Disposition: A | Discharge status: Home / Self Care | Attending: Cardiovascular Disease | Admitting: Cardiovascular Disease

## 2023-10-01 ENCOUNTER — Other Ambulatory Visit (HOSPITAL_COMMUNITY): Payer: Self-pay | Admitting: *Deleted

## 2023-10-01 ENCOUNTER — Ambulatory Visit: Payer: Self-pay | Admitting: Cardiovascular Disease

## 2023-10-01 DIAGNOSIS — R072 Precordial pain: Secondary | ICD-10-CM

## 2023-10-01 DIAGNOSIS — J45901 Unspecified asthma with (acute) exacerbation: Secondary | ICD-10-CM | POA: Diagnosis not present

## 2023-10-01 LAB — BASIC METABOLIC PANEL WITH GFR
Anion gap: 11 (ref 5–15)
BUN: 8 mg/dL (ref 8–23)
CO2: 23 mmol/L (ref 22–32)
Calcium: 9.3 mg/dL (ref 8.9–10.3)
Chloride: 103 mmol/L (ref 98–111)
Creatinine, Ser: 0.95 mg/dL (ref 0.44–1.00)
GFR, Estimated: >60 mL/min (ref >60)
Glucose, Bld: 264 mg/dL — ABNORMAL HIGH (ref 70–99)
Potassium: 3.8 mmol/L (ref 3.5–5.1)
Sodium: 137 mmol/L (ref 135–145)

## 2023-10-01 LAB — CBC
HCT: 38.7 % (ref 36.0–46.0)
Hemoglobin: 13.1 g/dL (ref 12.0–15.0)
MCH: 27.4 pg (ref 26.0–34.0)
MCHC: 33.9 g/dL (ref 30.0–36.0)
MCV: 81 fL (ref 80.0–100.0)
Platelets: 190 K/uL (ref 150–400)
RBC: 4.78 MIL/uL (ref 3.87–5.11)
RDW: 14.4 % (ref 11.5–15.5)
WBC: 3.4 K/uL — ABNORMAL LOW (ref 4.0–10.5)
nRBC: 0 % (ref 0.0–0.2)

## 2023-10-01 LAB — LIPID PANEL
Cholesterol: 239 mg/dL — ABNORMAL HIGH (ref 0–200)
HDL: 47 mg/dL (ref >40)
LDL Cholesterol: 179 mg/dL — ABNORMAL HIGH (ref 0–99)
Total CHOL/HDL Ratio: 5.1 ratio
Triglycerides: 63 mg/dL (ref <150)
VLDL: 13 mg/dL (ref 0–40)

## 2023-10-01 LAB — HEMOGLOBIN A1C
Hgb A1c MFr Bld: 6.4 % — ABNORMAL HIGH (ref 4.8–5.6)
Mean Plasma Glucose: 136.98 mg/dL

## 2023-10-01 LAB — TSH: TSH: 1.268 u[IU]/mL (ref 0.350–4.500)

## 2023-10-01 LAB — GLUCOSE, CAPILLARY: Glucose-Capillary: 167 mg/dL — ABNORMAL HIGH (ref 70–99)

## 2023-10-01 MED ORDER — PREDNISONE 20 MG PO TABS: 40.0000 mg | ORAL_TABLET | Freq: Every day | ORAL | 0 refills | Status: AC

## 2023-10-01 MED ORDER — ASPIRIN 81 MG PO TBEC
81.0000 mg | DELAYED_RELEASE_TABLET | Freq: Every day | ORAL | 2 refills | Status: DC
Start: 2023-10-01 — End: 2023-11-20

## 2023-10-01 MED ORDER — NITROGLYCERIN 0.4 MG SL SUBL
SUBLINGUAL_TABLET | SUBLINGUAL | Status: AC
  Filled 2023-10-01: qty 2

## 2023-10-01 MED ORDER — IOHEXOL 350 MG/ML SOLN
100.0000 mL | Freq: Once | INTRAVENOUS | Status: AC | PRN
  Administered 2023-10-01: 100 mL via INTRAVENOUS

## 2023-10-01 MED ORDER — NITROGLYCERIN 0.4 MG SL SUBL
0.8000 mg | SUBLINGUAL_TABLET | Freq: Once | SUBLINGUAL | Status: AC
  Administered 2023-10-01: 0.8 mg via SUBLINGUAL

## 2023-10-01 MED ORDER — IRBESARTAN 300 MG PO TABS: 300.0000 mg | ORAL_TABLET | Freq: Every day | ORAL | 2 refills | Status: DC

## 2023-10-01 MED ORDER — AMLODIPINE BESYLATE 5 MG PO TABS: 5.0000 mg | ORAL_TABLET | Freq: Every day | ORAL | 2 refills | Status: DC

## 2023-10-01 MED ORDER — METOPROLOL SUCCINATE ER 50 MG PO TB24: 50.0000 mg | ORAL_TABLET | Freq: Every day | ORAL | 2 refills | Status: DC

## 2023-10-01 MED ORDER — SPIRONOLACTONE 50 MG PO TABS: 75.0000 mg | ORAL_TABLET | Freq: Every day | ORAL | 2 refills | Status: DC

## 2023-10-01 MED ORDER — HYDROCHLOROTHIAZIDE 25 MG PO TABS: 25.0000 mg | ORAL_TABLET | Freq: Every morning | ORAL | 2 refills | Status: DC

## 2023-10-01 NOTE — Discharge Summary (Signed)
 Physician Discharge Summary  Andrea Lowery FMW:[id number] DOB: 1940/10/12 DOA: 09/30/2023  PCP: Maree Leni Edyth DELENA, MD  Admit date: 09/30/2023 Discharge date: 10/01/2023  Admitted From: Home  Discharge disposition: Home    Recommendations for Outpatient Follow-Up:   Follow up with your primary care provider in one week.  Check CBC, BMP, magnesium  in the next visit Patient to follow-up with cardiovascular center for coronary CT scan.   Discharge Diagnosis:   Principal Problem:   Asthma, chronic, unspecified asthma severity, with acute exacerbation Active Problems:   Essential hypertension   MGUS (monoclonal gammopathy of unknown significance)   Type 2 diabetes mellitus with hyperglycemia (HCC)   Chronic diastolic CHF (congestive heart failure) (HCC)   Hypothyroidism   Discharge Condition: Improved.  Diet recommendation: Low sodium, heart healthy.  Carbohydrate-modified.    Wound care: None.  Code status: Full.   History of Present Illness:   83 y.o. female with past medical history of asthma with asthma on Symbicort  and Fasenra , HTN, DM, hypothyroidism and MGUS presented to hospital with 1 to 2-week history of shortness of breath dizziness wheezing.  Had initially gone to urgent care center.  COVID influenza was negative.  CTA of the chest was done without any infiltrate or pulmonary embolism.  EKG showed stable left bundle branch block.  Cardiology was consulted for chest discomfort and patient was admitted hospital for further evaluation and treatment.    Hospital Course:   Following conditions were addressed during hospitalization as listed below,  Asthma, chronic, unspecified asthma severity, with acute exacerbation Patient follows up with Dr. Kozlow.  Continue bronchodilators and prednisone  on discharge.  Appears to be compensated.    Hypothyroidism Continue Synthroid .  Latest TSH of 1.2   Chronic diastolic CHF (congestive heart failure)  Recent Echo EF  50%, grade I DD and asymmetric septal hypertrophy.  Not on diuretics at home.  Cardiology was consulted.  Continue Jardiance  and antihypertensives.  Ibersartan was added during hospitalization which will be continued on discharge for adequate blood pressure control..   Type 2 diabetes mellitus with hyperglycemia  Continue Jardiance .  Diabetic diet.  MGUS (monoclonal gammopathy of unknown significance) Follows with Dr. Lonn oncology as outpatient   Essential hypertension Hypertensive urgency On 5 drug therapy.  Likely nonadherent to medication. Prescriptions were given for amlodipine , hydrochlorothiazide , irbesartan , metoprolol  and spironolactone    Chest pain Denies current chest pain.  Chronic LBBB.  Cardiology seen the patient and recommended outpatient coronary CT.  Patient will go to cardiovascular center for that.    Hypokalemia Improved after supplementation.  Latest potassium of 3.8.   Disposition.  At this time, patient is stable for disposition home with outpatient PCP follow-up.  Patient's son at bedside and spoke about medications for blood pressure.    Medical Consultants:   Cardiology  Procedures:    None Subjective:   Today, patient was seen and examined at bedside.  Upset about IV access.  Denies any dyspnea chest pain fever chills or rigor.  Discharge Exam:   Vitals:   10/01/23 0752 10/01/23 0818  BP: (!) 192/85   Pulse: 70   Resp:    Temp: (!) 97.4 F (36.3 C)   SpO2: 98% 97%   Vitals:   10/01/23 0000 10/01/23 0454 10/01/23 0752 10/01/23 0818  BP: (!) 162/63 122/72 (!) 192/85   Pulse: 69 72 70   Resp: 18 19    Temp: 98 F (36.7 C) (!) 97.5 F (36.4 C) (!) 97.4 F (36.3 C)  TempSrc: Oral     SpO2: 98% 94% 98% 97%  Height:      Body mass index is 28.83 kg/m.   General: Alert awake, not in obvious distress, elderly female HENT: pupils equally reacting to light,  No scleral pallor or icterus noted. Oral mucosa is moist.  Chest: Diminished  breath sounds bilaterally. No crackles or wheezes.  CVS: S1 &S2 heard. No murmur.  Regular rate and rhythm. Abdomen: Soft, nontender, nondistended.  Bowel sounds are heard.   Extremities: No cyanosis, clubbing or edema.  Peripheral pulses are palpable. Psych: Alert, awake and Communicative. CNS:  No cranial nerve deficits.  Power equal in all extremities.   Skin: Warm and dry.  No rashes noted.  The results of significant diagnostics from this hospitalization (including imaging, microbiology, ancillary and laboratory) are listed below for reference.     Diagnostic Studies:   CT Angio Chest PE W and/or Wo Contrast Result Date: 09/30/2023 CLINICAL DATA:  Positive D-dimer and cough for several days, initial encounter EXAM: CT ANGIOGRAPHY CHEST WITH CONTRAST TECHNIQUE: Multidetector CT imaging of the chest was performed using the standard protocol during bolus administration of intravenous contrast. Multiplanar CT image reconstructions and MIPs were obtained to evaluate the vascular anatomy. RADIATION DOSE REDUCTION: This exam was performed according to the departmental dose-optimization program which includes automated exposure control, adjustment of the mA and/or kV according to patient size and/or use of iterative reconstruction technique. CONTRAST:  75mL OMNIPAQUE  IOHEXOL  350 MG/ML SOLN COMPARISON:  Chest x-ray from the previous day. FINDINGS: Cardiovascular: Atherosclerotic calcifications of the thoracic aorta are noted. No aneurysmal dilatation is seen. Heart is not significantly enlarged in size. The pulmonary artery shows a normal branching pattern bilaterally. No filling defect to suggest pulmonary embolism is noted. Mild coronary calcifications are noted. Mediastinum/Nodes: Thoracic inlet is within normal limits. No hilar or mediastinal adenopathy is noted. The esophagus as visualized is within normal limits. Lungs/Pleura: Lungs are well aerated bilaterally. A few calcified granulomas are  identified. No sizable parenchymal nodules are seen. No focal effusion is noted. No confluent infiltrate is seen. Upper Abdomen: Visualized upper abdomen shows evidence of cholecystectomy. No acute abnormality is noted. Musculoskeletal: Degenerative changes of the thoracic spine are noted. Review of the MIP images confirms the above findings. IMPRESSION: No evidence of pulmonary emboli. Changes of prior granulomatous disease. No acute abnormality noted. Aortic Atherosclerosis (ICD10-I70.0). Electronically Signed   By: Oneil Devonshire M.D.   On: 09/30/2023 03:10     Labs:   Basic Metabolic Panel: Recent Labs  Lab 09/29/23 2108 09/30/23 1225 10/01/23 0216  NA 139 138 137  K 3.0* 4.0 3.8  CL 102 101 103  CO2 26 27 23   GLUCOSE 144* 159* 264*  BUN <5* 5* 8  CREATININE 0.88 0.84 0.95  CALCIUM  9.5 9.8 9.3   GFR CrCl cannot be calculated (Unknown ideal weight.). Liver Function Tests: No results for input(s): AST, ALT, ALKPHOS, BILITOT, PROT, ALBUMIN in the last 168 hours. No results for input(s): LIPASE, AMYLASE in the last 168 hours. No results for input(s): AMMONIA in the last 168 hours. Coagulation profile No results for input(s): INR, PROTIME in the last 168 hours.  CBC: Recent Labs  Lab 09/29/23 2108 10/01/23 0216  WBC 5.0 3.4*  HGB 13.5 13.1  HCT 40.0 38.7  MCV 81.0 81.0  PLT 185 190   Cardiac Enzymes: No results for input(s): CKTOTAL, CKMB, CKMBINDEX, TROPONINI in the last 168 hours. BNP: Invalid input(s): POCBNP CBG: Recent Labs  Lab 09/30/23 1617 09/30/23 2055 10/01/23 0751  GLUCAP 142* 218* 167*   D-Dimer Recent Labs    09/30/23 0051  DDIMER 1.06*   Hgb A1c Recent Labs    10/01/23 0216  HGBA1C 6.4*   Lipid Profile Recent Labs    10/01/23 0216  CHOL 239*  HDL 47  LDLCALC 179*  TRIG 63  CHOLHDL 5.1   Thyroid  function studies Recent Labs    10/01/23 0216  TSH 1.268   Anemia work up No results for input(s):  VITAMINB12, FOLATE, FERRITIN, TIBC, IRON, RETICCTPCT in the last 72 hours. Microbiology No results found for this or any previous visit (from the past 240 hours).   Discharge Instructions:   Discharge Instructions     Diet - low sodium heart healthy   Complete by: As directed    Discharge instructions   Complete by: As directed    Follow up with your primary care provider in one week. Take all the blood pressure medications as prescribed. Seek medical attention for worsening symptoms. Follow with coronary CTA.   Increase activity slowly   Complete by: As directed       Allergies as of 10/01/2023       Reactions   Bee Venom Anaphylaxis   Yellow jackets   Penicillins Anaphylaxis, Hives        Medication List     TAKE these medications    albuterol  108 (90 Base) MCG/ACT inhaler Commonly known as: VENTOLIN  HFA INHALE 2 PUFFS EVERY 4 HOURS AS NEEDED FOR WHEEZING OR SHORTNESS OF BREATH   amLODipine  5 MG tablet Commonly known as: NORVASC  Take 1 tablet (5 mg total) by mouth daily.   aspirin  EC 81 MG tablet Take 1 tablet (81 mg total) by mouth daily. Swallow whole.   budesonide -formoterol  160-4.5 MCG/ACT inhaler Commonly known as: Symbicort  INHALE 2 PUFFS BY MOUTH INTO THE LUNGS TWICE DAILY RINSE GARGLE AND SPIT AFTER USE   cetirizine  10 MG tablet Commonly known as: ZYRTEC  Take 1 tablet (10 mg total) by mouth daily as needed for allergies (Can take an extra dose during flare ups.).   diazepam  2 MG tablet Commonly known as: VALIUM  Take 2 mg by mouth daily as needed.   empagliflozin  25 MG Tabs tablet Commonly known as: Jardiance  Take 1 tablet (25 mg total) by mouth daily before breakfast.   EPINEPHrine  0.3 mg/0.3 mL Soaj injection Commonly known as: EPI-PEN Inject 0.3 mg into the muscle as needed for anaphylaxis. Use as directed for severe allergic reaction   esomeprazole  40 MG capsule Commonly known as: NEXIUM  Take 1 capsule (40 mg total) by mouth 2  (two) times daily.   ezetimibe  10 MG tablet Commonly known as: ZETIA  TAKE 1 TABLET EVERY DAY   Fasenra  Pen 30 MG/ML prefilled autoinjector Generic drug: benralizumab  Inject 1 mL (30 mg total) into the skin every 8 (eight) weeks.   Fluocinolone  Acetonide Scalp 0.01 % Oil Commonly known as: Derma-Smoothe /FS Scalp Apply 1-7 times a week   fluticasone  50 MCG/ACT nasal spray Commonly known as: FLONASE  Place 2 sprays into both nostrils daily.   hydrochlorothiazide  25 MG tablet Commonly known as: HYDRODIURIL  Take 1 tablet (25 mg total) by mouth every morning.   hydrOXYzine  25 MG tablet Commonly known as: ATARAX  Take 25 mg by mouth 3 (three) times daily as needed.   irbesartan  300 MG tablet Commonly known as: AVAPRO  Take 1 tablet (300 mg total) by mouth daily.   levothyroxine  50 MCG tablet Commonly known as: SYNTHROID  TAKE  1 TABLET EVERY DAY, AT LEAST 30-60 MINUTES BEFORE OTHER FOOD OR MEDICINE.   metoprolol  succinate 50 MG 24 hr tablet Commonly known as: Toprol  XL Take 1 tablet (50 mg total) by mouth daily. Take with or immediately following a meal.   montelukast  10 MG tablet Commonly known as: SINGULAIR  Take 1 tablet (10 mg total) by mouth at bedtime.   predniSONE  20 MG tablet Commonly known as: DELTASONE  Take 2 tablets (40 mg total) by mouth daily with breakfast for 3 days. Start taking on: October 02, 2023   rosuvastatin  40 MG tablet Commonly known as: CRESTOR  TAKE 1 TABLET EVERY DAY (NEED MD APPOINTMENT FOR REFILLS)   Ryaltris  665-25 MCG/ACT Susp Generic drug: Olopatadine -Mometasone  Place 2 sprays into the nose 2 (two) times daily.   spironolactone  50 MG tablet Commonly known as: ALDACTONE  Take 1.5 tablets (75 mg total) by mouth daily.   True Metrix Blood Glucose Test test strip Generic drug: glucose blood USE AS DIRECTED        Follow-up Information     Maree Leni Edyth DELENA, MD Follow up in 1 week(s).   Specialty: Family Medicine Contact  information: [address] [place] [address] [phone]                  Time coordinating discharge: 39 minutes  Signed:  Melat Wrisley  [facility] 10/01/2023, 10:46 AM

## 2023-10-01 NOTE — Progress Notes (Signed)
  Progress Note  Patient Name: Andrea Lowery Date of Encounter: 10/01/2023 [facility] Cardiologist: None   Interval Summary   No further episodes of chest discomfort.  No significant arrhythmia on monitor.  Blood pressure was normal when asleep, a little high this morning.  Vital Signs Vitals:   10/01/23 0000 10/01/23 0454 10/01/23 0752 10/01/23 0818  BP: (!) 162/63 122/72 (!) 192/85   Pulse: 69 72 70   Resp: 18 19    Temp: 98 F (36.7 C) (!) 97.5 F (36.4 C) (!) 97.4 F (36.3 C)   TempSrc: Oral     SpO2: 98% 94% 98% 97%  Height:        Intake/Output Summary (Last 24 hours) at 10/01/2023 0858 Last data filed at 09/30/2023 2100 Gross per 24 hour  Intake 250 ml  Output --  Net 250 ml      05/27/2023    1:38 PM 01/30/2023    8:05 PM 01/20/2023    5:06 PM  Last 3 Weights  Weight (lbs) 184 lb 1.6 oz 180 lb 188 lb 7.9 oz  Weight (kg) 83.507 kg 81.647 kg 85.5 kg      Telemetry/ECG  Sinus rhythm- Personally Reviewed  Physical Exam  GEN: No acute distress.   Neck: No JVD Cardiac: RRR, no murmurs, rubs, or gallops.  Respiratory: Clear to auscultation bilaterally. GI: Soft, nontender, non-distended  MS: No edema  Assessment & Plan   Unfortunately were not able to get an adequate size IV to perform coronary CT angiography as an inpatient.  She is currently asymptomatic and cardiac biomarkers have been negative (nondiagnostic ECG due to LBBB).  I have offered hospital discharge with outpatient coronary CT angiography.  Trying to figure out appropriate timing. Reinforced the importance of compliance with antihypertensive and lipid-lowering medication. Will also need follow-up to make sure that the current doses of antihypertensive medications are appropriate. She is currently receiving maximum doses of amlodipine  and irbesartan  as well as thiazide diuretic, beta-blocker and aldosterone antagonist.  She has normal electrolytes and normal renal function. If  coronary CT to be performed as an outpatient, she should not receive take her diuretics, ARB or SGLT2 inhibitor on the morning of the test.   For questions or updates, please contact [facility] Please consult [email or web address] for contact info under       Signed, Jerel Balding, MD

## 2023-10-01 NOTE — Progress Notes (Signed)
 At bedside for PIV insertion. Stuck x3 by 2 different Rns; successful attempt with US . Once PIV insertion complete new consult 18G in [facility] for radiology was placed. Reported to primary RN. Pt currently has a functioning 20G. Primary RN aware.

## 2023-10-15 ENCOUNTER — Ambulatory Visit: Attending: Physician Assistant | Admitting: Cardiology

## 2023-10-15 VITALS — BP 105/60 | HR 60 | Ht 66.0 in | Wt 170.0 lb

## 2023-10-15 DIAGNOSIS — I10 Essential (primary) hypertension: Secondary | ICD-10-CM | POA: Diagnosis not present

## 2023-10-15 DIAGNOSIS — I6523 Occlusion and stenosis of bilateral carotid arteries: Secondary | ICD-10-CM | POA: Diagnosis not present

## 2023-10-15 DIAGNOSIS — R918 Other nonspecific abnormal finding of lung field: Secondary | ICD-10-CM

## 2023-10-15 DIAGNOSIS — I2510 Atherosclerotic heart disease of native coronary artery without angina pectoris: Secondary | ICD-10-CM | POA: Diagnosis not present

## 2023-10-15 DIAGNOSIS — I447 Left bundle-branch block, unspecified: Secondary | ICD-10-CM

## 2023-10-15 NOTE — Progress Notes (Signed)
 Cardiology Office Note:  .   Date:  10/15/2023  ID:  Andrea Lowery, DOB March 23, 1940, MRN [id number] PCP: Andrea Leni Edyth DELENA, MD  [facility] Providers Cardiologist:  Andrea Gull, MD {  History of Present Illness: .   Andrea Lowery is a 83 y.o. 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     Chest pain Normal stress test 2012 Admission 09/2023 with mixed presentation.  CCTA, minimal to mild nonobstructive CAD, CAC score 225.  Social history  Son and father history of Mis Previous smoker quit 15+ years ago, 10+ pack year history.  No drugs or alcohol .     Patient was recently admitted 09/2023 for chest pain and negative troponins.  Also with asthma exacerbation and hypertensive urgency noncompliant with medications. She had reported chest pain for the last year that became more exertional and progressive in the last 1 to 2 months while doing daily household chores.  Reported radiating pain.  However symptoms also reproducible with palpation.  Coronary CTA with minimal to no nonobstructive CAD.  Today patient presents for hospital follow-up.  She has been doing very well and has been taking all of her medications.  However she does not have a blood pressure cuff so she is unsure what her BP has been.  She reports she did see her PCP last week and reportedly it was good at that time.  She has had no further episodes of chest pain.  She reports vague rib pain on the side but nothing that is concerning for her.  Otherwise without any other complaints.  Recently when she was not taking her medications she had attributed it to multiple life stressors.  She had a grandson who abruptly died and had a sister with severe dementia so both these things were intervening upon her life but now she is in a better place and making her  health more focused.  She is trying to exercise and be more physically active.  ROS: Denies: Chest pain, shortness of breath, orthopnea, peripheral edema, palpitations, decreased exercise intolerance, fatigue, lightheadedness.   Studies Reviewed: .         Risk Assessment/Calculations:             Physical Exam:   VS:  BP 105/60   Pulse 60   Ht 5' 6 (1.676 m)   Wt 170 lb (77.1 kg)   SpO2 98%   BMI 27.44 kg/m    Wt Readings from Last 3 Encounters:  10/15/23 170 lb (77.1 kg)  05/27/23 184 lb 1.6 oz (83.5 kg)  01/30/23 180 lb (81.6 kg)    GEN: Well nourished, well developed in no acute distress NECK: No JVD; No carotid bruits CARDIAC: RRR, no murmurs, rubs, gallops RESPIRATORY:  Clear to auscultation without rales, wheezing or rhonchi  ABDOMEN: Soft, non-tender, non-distended EXTREMITIES:  mild edema; No deformity   ASSESSMENT AND PLAN: .    Nonobstructive CAD  Chronic LBBB - normal echo 06/2023, mod asymmetric LVH likely related to HTN - CCTA 09/2023 minimal to mild nonobstructive CAD, CAC score 225 She has strong family history of CAD and numerous risk factors but with nonobstructive disease fortunately.  No longer having any chest pain.  Continue with aggressive secondary prevention Continue with aspirin , rosuvastatin  40 mg.  She just started taking rosuvastatin .  LDL was 179 09/2023.  Repeat  lipid panel in 2 months and assess efficacy. Get LFTs at next visit.  Hypertension Previously uncontrolled at hospitalization in the setting of noncompliance.  She is back on her usual regiment with the best blood pressures that she has had in years.  Today is 105/60 on recheck.   Currently on amlodipine  5, irbesartan  300 daily, HCTZ 25 mg, Toprol -XL 50 mg, spironolactone  75 mg. Blood pressure a little bit soft but without any associated symptoms.  She does not have a blood pressure cuff but I recommended her getting one.  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   Type 2 diabetes A1c 6.4% 09/2023  Continue Jardiance  25 mg   Carotid artery stenosis Moderate stenosis 2018, stable from previous.  Repeating vascular carotids.  Right middle lobe lung nodule Incidentally found on coronary CT.  Non-contrast chest CT at 6-12 months is recommended. If the nodule is stable at time of repeat CT, then future CT at 18-24 months (from today's scan) is considered optional for low-risk patients, but is recommended for high-risk patients. Repeat noncontrast CT at 6 months.  Otherwise defer to PCP.  No longer smoking.  Dispo: 6 month follow up with Dr. Okey per patients preference.   Signed, Andrea LITTIE Sluder, PA-C

## 2023-10-15 NOTE — Patient Instructions (Signed)
 Medication Instructions:  Your physician recommends that you continue on your current medications as directed. Please refer to the Current Medication list given to you today.  *If you need a refill on your cardiac medications before your next appointment, please call your pharmacy*  Lab Work: COME BACK IN 2 MONTHS FASTING FOR :  LIPID  If you have labs (blood work) drawn today and your tests are completely normal, you will receive your results only by: MyChart Message (if you have MyChart) OR A paper copy in the mail If you have any lab test that is abnormal or we need to change your treatment, we will call you to review the results.  Testing/Procedures: Your physician has requested that you have a carotid duplex. This test is an ultrasound of the carotid arteries in your neck. It looks at blood flow through these arteries that supply the brain with blood. Allow one hour for this exam. There are no restrictions or special instructions.   Your physician recommends you have a Chest CT Without Contrast in March 2026.   Follow-Up: At [facility], you and your health needs are our priority.  As part of our continuing mission to provide you with exceptional heart care, our providers are all part of one team.  This team includes your primary Cardiologist (physician) and Advanced Practice Providers or APPs (Physician Assistants and Nurse Practitioners) who all work together to provide you with the care you need, when you need it.  Your next appointment:   6 month(s)  Provider:   Vina Gull, MD    We recommend signing up for the patient portal called MyChart.  Sign up information is provided on this After Visit Summary.  MyChart is used to connect with patients for Virtual Visits (Telemedicine).  Patients are able to view lab/test results, encounter notes, upcoming appointments, etc.  Non-urgent messages can be sent to your provider as well.   To learn more about what you can do with  MyChart, go to [email or web address].   Other Instructions

## 2023-10-30 ENCOUNTER — Ambulatory Visit (HOSPITAL_COMMUNITY)
Admission: RE | Admit: 2023-10-30 | Discharge: 2023-10-30 | Disposition: A | Discharge status: Home / Self Care | Source: Ambulatory Visit | Attending: Cardiology | Admitting: Cardiology

## 2023-10-30 DIAGNOSIS — I10 Essential (primary) hypertension: Secondary | ICD-10-CM | POA: Insufficient documentation

## 2023-10-30 DIAGNOSIS — R918 Other nonspecific abnormal finding of lung field: Secondary | ICD-10-CM | POA: Insufficient documentation

## 2023-10-30 DIAGNOSIS — I6523 Occlusion and stenosis of bilateral carotid arteries: Secondary | ICD-10-CM | POA: Diagnosis present

## 2023-10-30 DIAGNOSIS — I447 Left bundle-branch block, unspecified: Secondary | ICD-10-CM | POA: Insufficient documentation

## 2023-10-30 DIAGNOSIS — I2510 Atherosclerotic heart disease of native coronary artery without angina pectoris: Secondary | ICD-10-CM | POA: Diagnosis present

## 2023-10-31 ENCOUNTER — Ambulatory Visit: Payer: Self-pay | Admitting: Physician Assistant

## 2023-11-11 ENCOUNTER — Other Ambulatory Visit: Payer: Self-pay

## 2023-11-11 ENCOUNTER — Emergency Department (HOSPITAL_COMMUNITY)

## 2023-11-11 ENCOUNTER — Encounter (HOSPITAL_COMMUNITY): Payer: Self-pay

## 2023-11-11 ENCOUNTER — Inpatient Hospital Stay (HOSPITAL_COMMUNITY)
Admission: EM | Admit: 2023-11-11 | Discharge: 2023-11-13 | DRG: 683 | Disposition: A | Discharge status: Home / Self Care | Attending: Internal Medicine | Admitting: Internal Medicine

## 2023-11-11 DIAGNOSIS — N179 Acute kidney failure, unspecified: Principal | ICD-10-CM | POA: Diagnosis present

## 2023-11-11 DIAGNOSIS — E785 Hyperlipidemia, unspecified: Secondary | ICD-10-CM | POA: Diagnosis present

## 2023-11-11 DIAGNOSIS — E871 Hypo-osmolality and hyponatremia: Secondary | ICD-10-CM | POA: Diagnosis not present

## 2023-11-11 DIAGNOSIS — I9589 Other hypotension: Secondary | ICD-10-CM | POA: Diagnosis present

## 2023-11-11 DIAGNOSIS — Z8709 Personal history of other diseases of the respiratory system: Secondary | ICD-10-CM

## 2023-11-11 DIAGNOSIS — E876 Hypokalemia: Secondary | ICD-10-CM | POA: Diagnosis not present

## 2023-11-11 DIAGNOSIS — E782 Mixed hyperlipidemia: Secondary | ICD-10-CM

## 2023-11-11 DIAGNOSIS — E861 Hypovolemia: Secondary | ICD-10-CM | POA: Diagnosis present

## 2023-11-11 DIAGNOSIS — Z880 Allergy status to penicillin: Secondary | ICD-10-CM

## 2023-11-11 DIAGNOSIS — E119 Type 2 diabetes mellitus without complications: Secondary | ICD-10-CM | POA: Diagnosis present

## 2023-11-11 DIAGNOSIS — Z79890 Hormone replacement therapy (postmenopausal): Secondary | ICD-10-CM

## 2023-11-11 DIAGNOSIS — Z87891 Personal history of nicotine dependence: Secondary | ICD-10-CM

## 2023-11-11 DIAGNOSIS — R531 Weakness: Secondary | ICD-10-CM

## 2023-11-11 DIAGNOSIS — Z7984 Long term (current) use of oral hypoglycemic drugs: Secondary | ICD-10-CM

## 2023-11-11 DIAGNOSIS — W1830XA Fall on same level, unspecified, initial encounter: Secondary | ICD-10-CM | POA: Diagnosis present

## 2023-11-11 DIAGNOSIS — E039 Hypothyroidism, unspecified: Secondary | ICD-10-CM | POA: Diagnosis present

## 2023-11-11 DIAGNOSIS — E872 Acidosis, unspecified: Secondary | ICD-10-CM | POA: Diagnosis present

## 2023-11-11 DIAGNOSIS — D472 Monoclonal gammopathy: Secondary | ICD-10-CM | POA: Diagnosis present

## 2023-11-11 DIAGNOSIS — E860 Dehydration: Secondary | ICD-10-CM | POA: Diagnosis present

## 2023-11-11 DIAGNOSIS — I10 Essential (primary) hypertension: Secondary | ICD-10-CM | POA: Diagnosis present

## 2023-11-11 DIAGNOSIS — E8729 Other acidosis: Secondary | ICD-10-CM | POA: Diagnosis present

## 2023-11-11 DIAGNOSIS — Z8679 Personal history of other diseases of the circulatory system: Secondary | ICD-10-CM

## 2023-11-11 DIAGNOSIS — M25551 Pain in right hip: Secondary | ICD-10-CM | POA: Diagnosis present

## 2023-11-11 DIAGNOSIS — J301 Allergic rhinitis due to pollen: Secondary | ICD-10-CM

## 2023-11-11 DIAGNOSIS — S7001XA Contusion of right hip, initial encounter: Secondary | ICD-10-CM | POA: Diagnosis present

## 2023-11-11 DIAGNOSIS — Z7951 Long term (current) use of inhaled steroids: Secondary | ICD-10-CM

## 2023-11-11 DIAGNOSIS — Z7982 Long term (current) use of aspirin: Secondary | ICD-10-CM

## 2023-11-11 DIAGNOSIS — J4489 Other specified chronic obstructive pulmonary disease: Secondary | ICD-10-CM | POA: Diagnosis present

## 2023-11-11 DIAGNOSIS — Z8249 Family history of ischemic heart disease and other diseases of the circulatory system: Secondary | ICD-10-CM

## 2023-11-11 DIAGNOSIS — J309 Allergic rhinitis, unspecified: Secondary | ICD-10-CM | POA: Diagnosis present

## 2023-11-11 LAB — COMPREHENSIVE METABOLIC PANEL WITH GFR
ALT: 34 U/L (ref 0–44)
AST: 34 U/L (ref 15–41)
Albumin: 3.6 g/dL (ref 3.5–5.0)
Alkaline Phosphatase: 65 U/L (ref 38–126)
Anion gap: 16 — ABNORMAL HIGH (ref 5–15)
BUN: 69 mg/dL — ABNORMAL HIGH (ref 8–23)
CO2: 21 mmol/L — ABNORMAL LOW (ref 22–32)
Calcium: 9.9 mg/dL (ref 8.9–10.3)
Chloride: 94 mmol/L — ABNORMAL LOW (ref 98–111)
Creatinine, Ser: 3.76 mg/dL — ABNORMAL HIGH (ref 0.44–1.00)
GFR, Estimated: 12 mL/min — ABNORMAL LOW (ref >60)
Glucose, Bld: 113 mg/dL — ABNORMAL HIGH (ref 70–99)
Potassium: 3.4 mmol/L — ABNORMAL LOW (ref 3.5–5.1)
Sodium: 131 mmol/L — ABNORMAL LOW (ref 135–145)
Total Bilirubin: 1.3 mg/dL — ABNORMAL HIGH (ref 0.0–1.2)
Total Protein: 7.7 g/dL (ref 6.5–8.1)

## 2023-11-11 LAB — I-STAT CG4 LACTIC ACID, ED: Lactic Acid, Venous: 1.7 mmol/L (ref 0.5–1.9)

## 2023-11-11 LAB — URINALYSIS, W/ REFLEX TO CULTURE (INFECTION SUSPECTED)
Bilirubin Urine: NEGATIVE
Glucose, UA: >=500 mg/dL — AB
Hgb urine dipstick: NEGATIVE
Ketones, ur: NEGATIVE mg/dL
Leukocytes,Ua: NEGATIVE
Nitrite: NEGATIVE
Protein, ur: NEGATIVE mg/dL
Specific Gravity, Urine: 1.009 (ref 1.005–1.030)
pH: 5 (ref 5.0–8.0)

## 2023-11-11 LAB — CBC WITH DIFFERENTIAL/PLATELET
Abs Immature Granulocytes: 0.02 K/uL (ref 0.00–0.07)
Basophils Absolute: 0.1 K/uL (ref 0.0–0.1)
Basophils Relative: 1 %
Eosinophils Absolute: 0.1 K/uL (ref 0.0–0.5)
Eosinophils Relative: 1 %
HCT: 42.5 % (ref 36.0–46.0)
Hemoglobin: 14.6 g/dL (ref 12.0–15.0)
Immature Granulocytes: 0 %
Lymphocytes Relative: 34 %
Lymphs Abs: 2.4 K/uL (ref 0.7–4.0)
MCH: 27 pg (ref 26.0–34.0)
MCHC: 34.4 g/dL (ref 30.0–36.0)
MCV: 78.6 fL — ABNORMAL LOW (ref 80.0–100.0)
Monocytes Absolute: 0.8 K/uL (ref 0.1–1.0)
Monocytes Relative: 12 %
Neutro Abs: 3.5 K/uL (ref 1.7–7.7)
Neutrophils Relative %: 52 %
Platelets: 183 K/uL (ref 150–400)
RBC: 5.41 MIL/uL — ABNORMAL HIGH (ref 3.87–5.11)
RDW: 13.6 % (ref 11.5–15.5)
WBC: 6.8 K/uL (ref 4.0–10.5)
nRBC: 0 % (ref 0.0–0.2)

## 2023-11-11 LAB — RESP PANEL BY RT-PCR (RSV, FLU A&B, COVID)  RVPGX2
Influenza A by PCR: NEGATIVE
Influenza B by PCR: NEGATIVE
Resp Syncytial Virus by PCR: NEGATIVE
SARS Coronavirus 2 by RT PCR: NEGATIVE

## 2023-11-11 LAB — CK: Total CK: 97 U/L (ref 38–234)

## 2023-11-11 MED ORDER — ACETAMINOPHEN 325 MG PO TABS: 650.0000 mg | ORAL_TABLET | Freq: Four times a day (QID) | ORAL | Status: DC | PRN

## 2023-11-11 MED ORDER — MELATONIN 3 MG PO TABS
3.0000 mg | ORAL_TABLET | Freq: Every evening | ORAL | Status: DC | PRN
  Administered 2023-11-13: 3 mg via ORAL
  Filled 2023-11-11: qty 1

## 2023-11-11 MED ORDER — AZTREONAM 2 G IJ SOLR: 2.0000 g | Freq: Once | INTRAMUSCULAR | Status: DC

## 2023-11-11 MED ORDER — ACETAMINOPHEN 650 MG RE SUPP: 650.0000 mg | Freq: Four times a day (QID) | RECTAL | Status: DC | PRN

## 2023-11-11 MED ORDER — SODIUM CHLORIDE 0.9 % IV SOLN
2.0000 g | Freq: Once | INTRAVENOUS | Status: AC
  Administered 2023-11-11: 2 g via INTRAVENOUS
  Filled 2023-11-11: qty 12.5

## 2023-11-11 MED ORDER — ONDANSETRON HCL 4 MG/2ML IJ SOLN: 4.0000 mg | Freq: Four times a day (QID) | INTRAMUSCULAR | Status: DC | PRN

## 2023-11-11 MED ORDER — VANCOMYCIN HCL IN DEXTROSE 1-5 GM/200ML-% IV SOLN
1000.0000 mg | Freq: Once | INTRAVENOUS | Status: AC
  Administered 2023-11-11: 1000 mg via INTRAVENOUS
  Filled 2023-11-11: qty 200

## 2023-11-11 MED ORDER — LACTATED RINGERS IV BOLUS (SEPSIS)
1000.0000 mL | Freq: Once | INTRAVENOUS | Status: AC
  Administered 2023-11-11: 1000 mL via INTRAVENOUS

## 2023-11-11 MED ORDER — METRONIDAZOLE 500 MG/100ML IV SOLN
500.0000 mg | Freq: Once | INTRAVENOUS | Status: AC
  Administered 2023-11-11: 500 mg via INTRAVENOUS
  Filled 2023-11-11: qty 100

## 2023-11-11 MED ORDER — INSULIN ASPART 100 UNIT/ML IJ SOLN: 0.0000 [IU] | Freq: Every day | INTRAMUSCULAR | Status: DC

## 2023-11-11 MED ORDER — LACTATED RINGERS IV SOLN: INTRAVENOUS | Status: AC

## 2023-11-11 MED ORDER — INSULIN ASPART 100 UNIT/ML IJ SOLN: 0.0000 [IU] | Freq: Three times a day (TID) | INTRAMUSCULAR | Status: DC

## 2023-11-11 NOTE — ED Provider Notes (Addendum)
 [place] EMERGENCY DEPARTMENT AT [facility] Provider Note   CSN: [id number] Arrival date & time: 11/11/23  8162     Patient presents with: Abnormal Lab and Weakness   Andrea Lowery is a 83 y.o. female.   Pt with c/o generally feeling weak for the past few days, and being called today and told recent blood work drawn yesterday was abnormal with creatinine 4.6. pt indicates was in baseline state of health until five days ago when fell at home. Indicates was reaching for a door, door closed and she fell forward. C/o contusion to right hip then. Was able to get self back to couch, but indicates for the next three days did not get up due to pain in low back and right hip. Ate/drank v little. Yesterday was able to go to doctor and has been ambulating on own, and had labs done then.  W fall, denies head injury or loc. No faintness or dizziness prior to fall, describes a mechanical fall. No anticoag use. No headache since. No neck or upper back pain. No current or recent chest pain or sob. No abd pain or nv. Occasional non prod cough, no sore throat. No dysuria, although is urinating less than normal, did go 2x/today.  No fever or chills.   The history is provided by the patient, medical records and a relative.  Abnormal Lab Weakness Associated symptoms: cough   Associated symptoms: no abdominal pain, no chest pain, no diarrhea, no dysuria, no fever, no headaches, no shortness of breath and no vomiting        Prior to Admission medications   Medication Sig Start Date End Date Taking? Authorizing Provider  albuterol  (VENTOLIN  HFA) 108 (90 Base) MCG/ACT inhaler INHALE 2 PUFFS EVERY 4 HOURS AS NEEDED FOR WHEEZING OR SHORTNESS OF BREATH 08/05/22   Ambs, Arlean HERO, FNP  amLODipine  (NORVASC ) 5 MG tablet Take 1 tablet (5 mg total) by mouth daily. 10/01/23   Pokhrel, Vernal, MD  aspirin  EC 81 MG tablet Take 1 tablet (81 mg total) by mouth daily. Swallow whole. 10/01/23   Pokhrel, Vernal, MD   Benralizumab  (FASENRA  PEN) 30 MG/ML SOAJ Inject 1 mL (30 mg total) into the skin every 8 (eight) weeks. 07/04/22   Ambs, Arlean HERO, FNP  budesonide -formoterol  (SYMBICORT ) 160-4.5 MCG/ACT inhaler INHALE 2 PUFFS BY MOUTH INTO THE LUNGS TWICE DAILY RINSE GARGLE AND SPIT AFTER USE 11/26/22   Kozlow, Camellia PARAS, MD  cetirizine  (ZYRTEC ) 10 MG tablet Take 1 tablet (10 mg total) by mouth daily as needed for allergies (Can take an extra dose during flare ups.). 11/26/22   Kozlow, Camellia PARAS, MD  diazepam  (VALIUM ) 2 MG tablet Take 2 mg by mouth daily as needed. 05/22/23   [provider]  empagliflozin  (JARDIANCE ) 25 MG TABS tablet Take 1 tablet (25 mg total) by mouth daily before breakfast. 09/12/22   Karna Fellows, MD  EPINEPHrine  0.3 mg/0.3 mL IJ SOAJ injection Inject 0.3 mg into the muscle as needed for anaphylaxis. Use as directed for severe allergic reaction 05/27/23   Kozlow, Camellia PARAS, MD  esomeprazole  (NEXIUM ) 40 MG capsule Take 1 capsule (40 mg total) by mouth 2 (two) times daily. 05/27/23   Kozlow, Camellia PARAS, MD  ezetimibe  (ZETIA ) 10 MG tablet TAKE 1 TABLET EVERY DAY 11/11/22   Karna Fellows, MD  Fluocinolone  Acetonide Scalp (DERMA-SMOOTHE JANA SCALP) 0.01 % OIL Apply 1-7 times a week 05/28/23   Kozlow, Eric J, MD  fluticasone  (FLONASE ) 50 MCG/ACT nasal spray  Place 2 sprays into both nostrils daily. 05/27/23   Kozlow, Eric J, MD  glucose blood (TRUE METRIX BLOOD GLUCOSE TEST) test strip USE AS DIRECTED 03/19/22   Karna Fellows, MD  hydrochlorothiazide  (HYDRODIURIL ) 25 MG tablet Take 1 tablet (25 mg total) by mouth every morning. 10/01/23   Pokhrel, Laxman, MD  hydrOXYzine  (ATARAX ) 25 MG tablet Take 25 mg by mouth 3 (three) times daily as needed. 05/22/23   [provider]  irbesartan  (AVAPRO ) 300 MG tablet Take 1 tablet (300 mg total) by mouth daily. 10/01/23   Pokhrel, Vernal, MD  levothyroxine  (SYNTHROID ) 50 MCG tablet TAKE 1 TABLET EVERY DAY, AT LEAST 30-60 MINUTES BEFORE OTHER FOOD OR MEDICINE. 08/12/22   Karna Fellows, MD   metoprolol  succinate (TOPROL  XL) 50 MG 24 hr tablet Take 1 tablet (50 mg total) by mouth daily. Take with or immediately following a meal. 10/01/23 09/30/24  Pokhrel, Vernal, MD  montelukast  (SINGULAIR ) 10 MG tablet Take 1 tablet (10 mg total) by mouth at bedtime. 05/27/23   Kozlow, Camellia PARAS, MD  Olopatadine -Mometasone  (RYALTRIS ) [phone] MCG/ACT SUSP Place 2 sprays into the nose 2 (two) times daily. 11/26/22   Kozlow, Camellia PARAS, MD  rosuvastatin  (CRESTOR ) 40 MG tablet TAKE 1 TABLET EVERY DAY (NEED MD APPOINTMENT FOR REFILLS) 11/01/22   Karna Fellows, MD  spironolactone  (ALDACTONE ) 50 MG tablet Take 1.5 tablets (75 mg total) by mouth daily. 10/01/23 12/30/23  Pokhrel, Laxman, MD    Allergies: Bee venom and Penicillins    Review of Systems  Constitutional:  Negative for chills, diaphoresis and fever.  HENT:  Negative for sore throat.   Eyes:  Negative for redness and visual disturbance.  Respiratory:  Positive for cough. Negative for shortness of breath.   Cardiovascular:  Negative for chest pain.       Chronic bil lower leg swelling, denies acute change.   Gastrointestinal:  Negative for abdominal pain, blood in stool, diarrhea and vomiting.  Genitourinary:  Negative for dysuria, flank pain, hematuria, vaginal bleeding and vaginal discharge.  Musculoskeletal:  Negative for neck pain and neck stiffness.  Skin:  Negative for rash and wound.  Neurological:  Positive for weakness. Negative for syncope, speech difficulty, numbness and headaches.  Hematological:  Does not bruise/bleed easily.  Psychiatric/Behavioral:  Negative for confusion.     Updated Vital Signs BP (!) 111/50   Pulse (!) 51   Temp (!) 97.4 F (36.3 C)   Resp 15   Ht 1.676 m (5' 6)   Wt 65.8 kg   SpO2 100%   BMI 23.40 kg/m   Physical Exam Vitals and nursing note reviewed.  Constitutional:      Appearance: Normal appearance. She is well-developed.  HENT:     Head: Atraumatic.     Nose: Nose normal.     Mouth/Throat:      Mouth: Mucous membranes are moist.  Eyes:     General: No scleral icterus.    Conjunctiva/sclera: Conjunctivae normal.     Pupils: Pupils are equal, round, and reactive to light.  Neck:     Vascular: No carotid bruit.     Trachea: No tracheal deviation.     Comments: Trachea midline, thyroid  not grossly enlarged or tender. No neck stiffness or rigidity.  Cardiovascular:     Rate and Rhythm: Normal rate and regular rhythm.     Pulses: Normal pulses.     Heart sounds: Normal heart sounds. No murmur heard.    No friction rub. No gallop.  Pulmonary:     Effort: Pulmonary effort is normal. No respiratory distress.     Breath sounds: Normal breath sounds.  Abdominal:     General: Bowel sounds are normal. There is no distension.     Palpations: Abdomen is soft. There is no mass.     Tenderness: There is no abdominal tenderness. There is no guarding.  Genitourinary:    Comments: No cva tenderness.  Musculoskeletal:        General: No swelling.     Cervical back: Normal range of motion and neck supple. No rigidity or tenderness. No muscular tenderness.     Comments: Mid to lower lumbar tenderness, otherwise, CTLS spine, non tender, aligned, no step off.  Tenderness right hip, no gross deformity. Otherwise good rom bilateral extremities without other pain or focal bony tenderness. Symmetric bil ankle and lower leg edema - pt indicates is baseline for her. Intact distal pulses bil.   Skin:    General: Skin is warm and dry.     Findings: No rash.  Neurological:     Mental Status: She is alert.     Comments: Alert, speech normal. Motor/sens grossly intact bil.   Psychiatric:        Mood and Affect: Mood normal.     (all labs ordered are listed, but only abnormal results are displayed) Results for orders placed or performed during the hospital encounter of 11/11/23  Resp panel by RT-PCR (RSV, Flu A&B, Covid) Anterior Nasal Swab   Collection Time: 11/11/23  7:35 PM   Specimen: Anterior  Nasal Swab  Result Value Ref Range   SARS Coronavirus 2 by RT PCR NEGATIVE NEGATIVE   Influenza A by PCR NEGATIVE NEGATIVE   Influenza B by PCR NEGATIVE NEGATIVE   Resp Syncytial Virus by PCR NEGATIVE NEGATIVE  Comprehensive metabolic panel   Collection Time: 11/11/23  7:35 PM  Result Value Ref Range   Sodium 131 (L) 135 - 145 mmol/L   Potassium 3.4 (L) 3.5 - 5.1 mmol/L   Chloride 94 (L) 98 - 111 mmol/L   CO2 21 (L) 22 - 32 mmol/L   Glucose, Bld 113 (H) 70 - 99 mg/dL   BUN 69 (H) 8 - 23 mg/dL   Creatinine, Ser 6.23 (H) 0.44 - 1.00 mg/dL   Calcium  9.9 8.9 - 10.3 mg/dL   Total Protein 7.7 6.5 - 8.1 g/dL   Albumin 3.6 3.5 - 5.0 g/dL   AST 34 15 - 41 U/L   ALT 34 0 - 44 U/L   Alkaline Phosphatase 65 38 - 126 U/L   Total Bilirubin 1.3 (H) 0.0 - 1.2 mg/dL   GFR, Estimated 12 (L) >60 mL/min   Anion gap 16 (H) 5 - 15  CBC with Differential   Collection Time: 11/11/23  7:35 PM  Result Value Ref Range   WBC 6.8 4.0 - 10.5 K/uL   RBC 5.41 (H) 3.87 - 5.11 MIL/uL   Hemoglobin 14.6 12.0 - 15.0 g/dL   HCT 57.4 63.9 - 53.9 %   MCV 78.6 (L) 80.0 - 100.0 fL   MCH 27.0 26.0 - 34.0 pg   MCHC 34.4 30.0 - 36.0 g/dL   RDW 86.3 88.4 - 84.4 %   Platelets 183 150 - 400 K/uL   nRBC 0.0 0.0 - 0.2 %   Neutrophils Relative % 52 %   Neutro Abs 3.5 1.7 - 7.7 K/uL   Lymphocytes Relative 34 %   Lymphs Abs 2.4 0.7 - 4.0 K/uL  Monocytes Relative 12 %   Monocytes Absolute 0.8 0.1 - 1.0 K/uL   Eosinophils Relative 1 %   Eosinophils Absolute 0.1 0.0 - 0.5 K/uL   Basophils Relative 1 %   Basophils Absolute 0.1 0.0 - 0.1 K/uL   Immature Granulocytes 0 %   Abs Immature Granulocytes 0.02 0.00 - 0.07 K/uL  CK   Collection Time: 11/11/23  7:35 PM  Result Value Ref Range   Total CK 97 38 - 234 U/L  Urinalysis, w/ Reflex to Culture (Infection Suspected) -Urine, Catheterized   Collection Time: 11/11/23  7:53 PM  Result Value Ref Range   Specimen Source URINE, CATHETERIZED    Color, Urine YELLOW YELLOW    APPearance CLEAR CLEAR   Specific Gravity, Urine 1.009 1.005 - 1.030   pH 5.0 5.0 - 8.0   Glucose, UA >=500 (A) NEGATIVE mg/dL   Hgb urine dipstick NEGATIVE NEGATIVE   Bilirubin Urine NEGATIVE NEGATIVE   Ketones, ur NEGATIVE NEGATIVE mg/dL   Protein, ur NEGATIVE NEGATIVE mg/dL   Nitrite NEGATIVE NEGATIVE   Leukocytes,Ua NEGATIVE NEGATIVE   RBC / HPF 0-5 0 - 5 RBC/hpf   WBC, UA 0-5 0 - 5 WBC/hpf   Bacteria, UA FEW (A) NONE SEEN   Squamous Epithelial / HPF 0-5 0 - 5 /HPF   Mucus PRESENT    Hyaline Casts, UA PRESENT   I-Stat Lactic Acid, ED   Collection Time: 11/11/23  7:57 PM  Result Value Ref Range   Lactic Acid, Venous 1.7 0.5 - 1.9 mmol/L   *Note: Due to a large number of results and/or encounters for the requested time period, some results have not been displayed. A complete set of results can be found in Results Review.   US  Renal Result Date: 11/11/2023 CLINICAL DATA:  aki, r/o obstructive uropathy EXAM: RENAL / URINARY TRACT ULTRASOUND COMPLETE COMPARISON:  None Available. FINDINGS: Right Kidney: Renal measurements: 10.5 x 3.7 x 4.3 cm = volume: 87 mL. Echogenicity within normal limits. No mass or hydronephrosis visualized. Left Kidney: Renal measurements: 11.5 x 5.3 x 4.1 cm = volume: 129 mL. Echogenicity within normal limits. No mass or hydronephrosis visualized. Urinary bladder: Appears normal for degree of bladder distention. Other: None. IMPRESSION: Unremarkable renal ultrasound. Electronically Signed   By: Morgane  Naveau M.D.   On: 11/11/2023 22:01   DG HIP UNILAT W OR W/O PELVIS 2-3 VIEWS RIGHT Result Date: 11/11/2023 CLINICAL DATA:  fall, pain EXAM: DG HIP (WITH OR WITHOUT PELVIS) 2-3V RIGHT COMPARISON:  X-ray pelvis 03/24/2015 FINDINGS: There is no evidence of hip fracture or dislocation of the right hip. No acute displaced fracture or dislocation of the left hip on frontal view. No acute displaced fracture or diastasis of the bones of the pelvis. Interval development of at  least mild degenerative changes of the right hip. No aggressive appearing osseous lesion. Degenerative changes of the lower lumbar spine. IMPRESSION: Negative for acute traumatic injury. Electronically Signed   By: Morgane  Naveau M.D.   On: 11/11/2023 21:44   DG Lumbar Spine Complete Result Date: 11/11/2023 CLINICAL DATA:  fall, pain EXAM: LUMBAR SPINE - COMPLETE 4+ VIEW COMPARISON:  None Available. FINDINGS: There is no evidence of lumbar spine fracture. Moderate facet arthropathy. Mild to moderate degenerative changes of the spine most marked at the L4-L5 level. No severe osseous neural foraminal stenosis. Alignment is normal. Intervertebral disc spaces are maintained. Right upper quadrant surgical clips. Atherosclerotic plaque of the aorta. IMPRESSION: 1. No acute displaced fracture or traumatic  listhesis of the lumbar spine. 2.  Aortic Atherosclerosis (ICD10-I70.0). Electronically Signed   By: Morgane  Naveau M.D.   On: 11/11/2023 21:39   DG Chest Port 1 View Result Date: 11/11/2023 CLINICAL DATA:  Question of sepsis to evaluate for abnormality. EXAM: PORTABLE CHEST 1 VIEW COMPARISON:  09/29/2023 FINDINGS: Mild hyperinflation. This likely indicates emphysema. Normal heart size and pulmonary vascularity. No focal airspace disease or consolidation in the lungs. No blunting of costophrenic angles. No pneumothorax. Mediastinal contours appear intact. Calcification of the aorta. IMPRESSION: Emphysematous changes in the lungs. No evidence of active pulmonary disease. Electronically Signed   By: Elsie Gravely M.D.   On: 11/11/2023 19:46   VAS US  CAROTID Result Date: 10/30/2023 Carotid Arterial Duplex Study Patient Name:  OLUWATOBI RUPPE  Date of Exam:   10/30/2023 Medical Rec #: [id number]            Accession #:    [id number] Date of Birth: 1940-12-20             Patient Gender: F Patient Age:   18 years Exam Location:  [address] Procedure:      VAS US  CAROTID Referring Phys: SHENG HALEY  --------------------------------------------------------------------------------  Risk Factors:      Hypertension, Diabetes. Comparison Study:  02/14/16 Performing Technologist: Garnette Rockers  Examination Guidelines: A complete evaluation includes B-mode imaging, spectral Doppler, color Doppler, and power Doppler as needed of all accessible portions of each vessel. Bilateral testing is considered an integral part of a complete examination. Limited examinations for reoccurring indications may be performed as noted.  Right Carotid Findings: +----------+-------+--------+--------+----------------------+------------------+           PSV    EDV cm/sStenosisPlaque Description    Comments                     cm/s                                                            +----------+-------+--------+--------+----------------------+------------------+ CCA Prox  58     6                                     intimal thickening +----------+-------+--------+--------+----------------------+------------------+ CCA Distal63     6                                     intimal thickening +----------+-------+--------+--------+----------------------+------------------+ ICA Prox  94     4       1-39%   diffuse and                                                               heterogenous                             +----------+-------+--------+--------+----------------------+------------------+ ICA Mid   84     14                                                       +----------+-------+--------+--------+----------------------+------------------+  ICA Distal111    17                                                       +----------+-------+--------+--------+----------------------+------------------+ ECA       85     6                                                        +----------+-------+--------+--------+----------------------+------------------+  +----------+--------+-------+--------+-------------------+           PSV cm/sEDV cmsDescribeArm Pressure (mmHG) +----------+--------+-------+--------+-------------------+ Subclavian99                     157                 +----------+--------+-------+--------+-------------------+ +---------+--------+--+--------+-+ VertebralPSV cm/s50EDV cm/s8 +---------+--------+--+--------+-+  Left Carotid Findings: +----------+--------+--------+--------+---------------------+------------------+           PSV cm/sEDV cm/sStenosisPlaque Description   Comments           +----------+--------+--------+--------+---------------------+------------------+ CCA Prox  68      5                                                       +----------+--------+--------+--------+---------------------+------------------+ CCA Distal59      11                                   intimal thickening +----------+--------+--------+--------+---------------------+------------------+ ICA Prox  56      13      1-39%   diffuse, hyperechoic                                                      and heterogenous                        +----------+--------+--------+--------+---------------------+------------------+ ICA Distal92      17                                                      +----------+--------+--------+--------+---------------------+------------------+ ECA       65      5                                                       +----------+--------+--------+--------+---------------------+------------------+ +----------+--------+--------+--------+-------------------+           PSV cm/sEDV cm/sDescribeArm Pressure (mmHG) +----------+--------+--------+--------+-------------------+ [id number]  147                 +----------+--------+--------+--------+-------------------+ +---------+--------+--+--------+-+ VertebralPSV cm/s54EDV cm/s9  +---------+--------+--+--------+-+   Summary: Right Carotid: Velocities in the right ICA are consistent with a 1-39% stenosis. Left Carotid: Velocities in the left ICA are consistent with a 1-39% stenosis. Vertebrals:  Bilateral vertebral arteries demonstrate antegrade flow. Subclavians: Normal flow hemodynamics were seen in bilateral subclavian              arteries. *See table(s) above for measurements and observations.  Electronically signed by Dorn Lesches MD on 10/30/2023 at 2:52:32 PM.    Final      EKG: EKG Interpretation Date/Time:  Tuesday November 11 2023 19:44:56 EDT Ventricular Rate:  50 PR Interval:  207 QRS Duration:  169 QT Interval:  500 QTC Calculation: 456 R Axis:   22  Text Interpretation: Sinus rhythm Left bundle branch block Non-specific ST-t changes Confirmed by Bernard Drivers ([id number]) on 11/11/2023 8:12:23 PM  Radiology: US  Renal Result Date: 11/11/2023 CLINICAL DATA:  aki, r/o obstructive uropathy EXAM: RENAL / URINARY TRACT ULTRASOUND COMPLETE COMPARISON:  None Available. FINDINGS: Right Kidney: Renal measurements: 10.5 x 3.7 x 4.3 cm = volume: 87 mL. Echogenicity within normal limits. No mass or hydronephrosis visualized. Left Kidney: Renal measurements: 11.5 x 5.3 x 4.1 cm = volume: 129 mL. Echogenicity within normal limits. No mass or hydronephrosis visualized. Urinary bladder: Appears normal for degree of bladder distention. Other: None. IMPRESSION: Unremarkable renal ultrasound. Electronically Signed   By: Morgane  Naveau M.D.   On: 11/11/2023 22:01   DG HIP UNILAT W OR W/O PELVIS 2-3 VIEWS RIGHT Result Date: 11/11/2023 CLINICAL DATA:  fall, pain EXAM: DG HIP (WITH OR WITHOUT PELVIS) 2-3V RIGHT COMPARISON:  X-ray pelvis 03/24/2015 FINDINGS: There is no evidence of hip fracture or dislocation of the right hip. No acute displaced fracture or dislocation of the left hip on frontal view. No acute displaced fracture or diastasis of the bones of the pelvis. Interval  development of at least mild degenerative changes of the right hip. No aggressive appearing osseous lesion. Degenerative changes of the lower lumbar spine. IMPRESSION: Negative for acute traumatic injury. Electronically Signed   By: Morgane  Naveau M.D.   On: 11/11/2023 21:44   DG Lumbar Spine Complete Result Date: 11/11/2023 CLINICAL DATA:  fall, pain EXAM: LUMBAR SPINE - COMPLETE 4+ VIEW COMPARISON:  None Available. FINDINGS: There is no evidence of lumbar spine fracture. Moderate facet arthropathy. Mild to moderate degenerative changes of the spine most marked at the L4-L5 level. No severe osseous neural foraminal stenosis. Alignment is normal. Intervertebral disc spaces are maintained. Right upper quadrant surgical clips. Atherosclerotic plaque of the aorta. IMPRESSION: 1. No acute displaced fracture or traumatic listhesis of the lumbar spine. 2.  Aortic Atherosclerosis (ICD10-I70.0). Electronically Signed   By: Morgane  Naveau M.D.   On: 11/11/2023 21:39   DG Chest Port 1 View Result Date: 11/11/2023 CLINICAL DATA:  Question of sepsis to evaluate for abnormality. EXAM: PORTABLE CHEST 1 VIEW COMPARISON:  09/29/2023 FINDINGS: Mild hyperinflation. This likely indicates emphysema. Normal heart size and pulmonary vascularity. No focal airspace disease or consolidation in the lungs. No blunting of costophrenic angles. No pneumothorax. Mediastinal contours appear intact. Calcification of the aorta. IMPRESSION: Emphysematous changes in the lungs. No evidence of active pulmonary disease. Electronically Signed   By: Elsie Gravely M.D.   On: 11/11/2023 19:46     Procedures   Medications Ordered in the ED  lactated ringers   infusion ( Intravenous New Bag/Given 11/11/23 1959)  vancomycin  (VANCOCIN ) IVPB 1000 mg/200 mL premix (has no administration in time range)  lactated ringers  bolus 1,000 mL (1,000 mLs Intravenous New Bag/Given 11/11/23 1959)  metroNIDAZOLE (FLAGYL) IVPB 500 mg (500 mg Intravenous New  Bag/Given 11/11/23 2040)  ceFEPIme (MAXIPIME) 2 g in sodium chloride  0.9 % 100 mL IVPB (0 g Intravenous Stopped 11/11/23 2028)                                    Medical Decision Making Problems Addressed: AKI (acute kidney injury): acute illness or injury with systemic symptoms that poses a threat to life or bodily functions Dehydration: acute illness or injury with systemic symptoms that poses a threat to life or bodily functions History of COPD: chronic illness or injury that poses a threat to life or bodily functions Hypokalemia: acute illness or injury Hypotension due to hypovolemia: acute illness or injury with systemic symptoms that poses a threat to life or bodily functions  Amount and/or Complexity of Data Reviewed Independent Historian:     Details: Family, hx External Data Reviewed: labs and notes. Labs: ordered. Decision-making details documented in ED Course. Radiology: ordered and independent interpretation performed. Decision-making details documented in ED Course. ECG/medicine tests: ordered and independent interpretation performed. Decision-making details documented in ED Course. Discussion of management or test interpretation with external provider(s): medicine  Risk Prescription drug management. Decision regarding hospitalization.   Iv ns. Continuous pulse ox and cardiac monitoring. Labs ordered/sent. Imaging ordered.   Differential diagnosis includes dehydration, sepsis, uti, aki, etc. Dispo decision including potential need for admission considered - will get labs and imaging and reassess.   Reviewed nursing notes and prior charts for additional history. External reports reviewed. Additional history from: family.   LR bolus. Cultures sent. Iv abx. Additional ivf.   Cardiac monitor: sinus rhythm, rate 66.  Labs reviewed/interpreted by me - lactate normal. Wbc and hgb normal. Ua neg for uti. Chem w aki, k sl low. Ivf bolus. U/s renal.   Xrays  reviewed/interpreted by me - no pna.no lumbar fx. No fx.   U/s reviewed/interpreted by me - no obstruction.   Recheck, bp improved. No chest pain or sob. No abd pain or nv. Abd soft non tender.   Medicine consulted for admission. Discussed pt with Dr Marcene - will admit.   With iv, hypotension improved/resolved.   Recheck pt, no pain or discomfort. No headache. No chest pain or sob. No abd pain or tenderness. No nv.   Medicine to admit.  CRITICAL CARE RE: Hypotension due to hypovolemima with AKI Performed by: Cynitha Berte E Alaynna Kerwood Total critical care time: 90 minutes Critical care time was exclusive of separately billable procedures and treating other patients. Critical care was necessary to treat or prevent imminent or life-threatening deterioration. Critical care was time spent personally by me on the following activities: development of treatment plan with patient and/or surrogate as well as nursing, discussions with consultants, evaluation of patient's response to treatment, examination of patient, obtaining history from patient or surrogate, ordering and performing treatments and interventions, ordering and review of laboratory studies, ordering and review of radiographic studies, pulse oximetry and re-evaluation of patient's condition.      Final diagnoses:  AKI (acute kidney injury)  Dehydration  Hypokalemia  History of COPD  Hypotension due to hypovolemia    ED Discharge Orders     None  Bernard Drivers, MD 11/11/23 2246

## 2023-11-11 NOTE — ED Triage Notes (Signed)
 Pt sent by her PCP for further eval of abnormal kidney labs. Pt creatinine 4.6, Bun 65 and GFR 11. Pt c.o anuria, generalized weakness. Pt denies sob or cp

## 2023-11-11 NOTE — ED Notes (Signed)
Gave patient coffee per request.

## 2023-11-11 NOTE — Sepsis Progress Note (Signed)
 Following for sepsis monitoring ?

## 2023-11-11 NOTE — H&P (Incomplete)
 History and Physical      Andrea Lowery FMW:[id number] DOB: [birth year] DOA: 11/11/2023; DOS: 11/11/2023  PCP: Maree Leni Edyth DELENA, MD *** Patient coming from: home ***  I have personally briefly reviewed patient's old medical records in [facility]  Chief Complaint: ***  HPI: Andrea Lowery is a 83 y.o. female with medical history significant for *** who is admitted to [facility] on 11/11/2023 with *** after presenting from home*** to [facility] ED complaining of ***.    ***       ***   ED Course:  Vital signs in the ED were notable for the following: ***  Labs were notable for the following: ***  Per my interpretation, EKG in ED demonstrated the following:  ***  Imaging in the ED, per corresponding formal radiology read, was notable for the following:  ***  While in the ED, the following were administered: ***  Subsequently, the patient was admitted  ***  ***red    Review of Systems: As per HPI otherwise 10 point review of systems negative.   Past Medical History:  Diagnosis Date   Arthritis    Asthma    Asthma 01/10/2016   Last Assessment & Plan:  After recent ED eval- currrently utilizing prednisone  as prescribed. Begin Levaquin , prednisone  and Albuterol  MDI to be utilized as needed for acute wheezing FU in 2 day is no improvement or worsening of symptoms. Pt verbalizes understanding for current treatment plan. Follow-up in one week. Plan to increase chronic inhaled steroid dose to assess further with prevention of   Diabetes mellitus without complication (HCC)    Diabetes II   Difficulty sleeping 12/28/2013   Last Assessment & Plan:  Stable with trazodone  nightly. Continue current treatment plan. Refill trazodone  today. Tolerating medication well without side effects. Return in 3 months for follow up or sooner if any worsening of symptoms.    DOE (dyspnea on exertion) 07/01/2012   DVT (deep venous thrombosis) (HCC)    Years ago    Dyslipidemia    Eczema    GERD (gastroesophageal reflux disease)    Headache    Migraine   Hypertension    Hypokalemia 04/30/2016   Hypothyroidism    LBBB (left bundle branch block)    MGUS (monoclonal gammopathy of unknown significance)    MGUS (monoclonal gammopathy of unknown significance)    Migraine    OSA on CPAP    Sinus infection    had sinus surgery   Urinary frequency 07/25/2021    Past Surgical History:  Procedure Laterality Date   ABDOMINAL HYSTERECTOMY  1969   ARTERY BIOPSY N/A 11/07/2014   Procedure: BIOPSY TEMPORAL ARTERY;  Surgeon: Daniel Moccasin, MD;  Location: [facility] OR;  Service: ENT;  Laterality: N/A;   CARPAL TUNNEL RELEASE Right    CHOLECYSTECTOMY  1976   ENDOVENOUS ABLATION SAPHENOUS VEIN W/ LASER Left 08/24/2020   endovenous laser ablation left greater saphenous vein by Medford Blade MD   LUMBAR LAMINECTOMY/DECOMPRESSION MICRODISCECTOMY N/A 07/31/2015   Procedure: L4-5 Decompression, Possible Right L4-5 Microdiscectomy;  Surgeon: Oneil JAYSON Herald, MD;  Location: [facility] OR;  Service: Orthopedics;  Laterality: N/A;   NASAL SINUS SURGERY     years ago   NM MYOVIEW  LTD  02/13/2010   No ischemia   PLANTAR FASCIA RELEASE     TONSILLECTOMY  1965   US  ECHOCARDIOGRAPHY  09/20/2008   borderline LVH,mild TR,AOV mildly sclerotic w/ca+ of the leaflets    Social History:  reports  that she quit smoking about 17 years ago. Her smoking use included cigarettes. She has never used smokeless tobacco. She reports that she does not drink alcohol  and does not use drugs.   Allergies  Allergen Reactions   Bee Venom Anaphylaxis    Yellow jackets   Penicillins Anaphylaxis and Hives    Family History  Problem Relation Age of Onset   Dementia Mother    Gallbladder disease Mother    Diabetes Father    Heart failure Father    Hypertension Father    Sleep apnea Father    Kidney disease Father    Dementia Father    Thyroid  disease Sister    Hypertension Sister    Dementia Sister     Healthy Sister     Family history reviewed and not pertinent ***   Prior to Admission medications   Medication Sig Start Date End Date Taking? Authorizing Provider  albuterol  (VENTOLIN  HFA) 108 (90 Base) MCG/ACT inhaler INHALE 2 PUFFS EVERY 4 HOURS AS NEEDED FOR WHEEZING OR SHORTNESS OF BREATH 08/05/22   Ambs, Arlean HERO, FNP  amLODipine  (NORVASC ) 5 MG tablet Take 1 tablet (5 mg total) by mouth daily. 10/01/23   Pokhrel, Vernal, MD  aspirin  EC 81 MG tablet Take 1 tablet (81 mg total) by mouth daily. Swallow whole. 10/01/23   Pokhrel, Vernal, MD  Benralizumab  (FASENRA  PEN) 30 MG/ML SOAJ Inject 1 mL (30 mg total) into the skin every 8 (eight) weeks. 07/04/22   Ambs, Arlean HERO, FNP  budesonide -formoterol  (SYMBICORT ) 160-4.5 MCG/ACT inhaler INHALE 2 PUFFS BY MOUTH INTO THE LUNGS TWICE DAILY RINSE GARGLE AND SPIT AFTER USE 11/26/22   Kozlow, Camellia PARAS, MD  cetirizine  (ZYRTEC ) 10 MG tablet Take 1 tablet (10 mg total) by mouth daily as needed for allergies (Can take an extra dose during flare ups.). 11/26/22   Kozlow, Camellia PARAS, MD  diazepam  (VALIUM ) 2 MG tablet Take 2 mg by mouth daily as needed. 05/22/23   [provider]  empagliflozin  (JARDIANCE ) 25 MG TABS tablet Take 1 tablet (25 mg total) by mouth daily before breakfast. 09/12/22   Karna Fellows, MD  EPINEPHrine  0.3 mg/0.3 mL IJ SOAJ injection Inject 0.3 mg into the muscle as needed for anaphylaxis. Use as directed for severe allergic reaction 05/27/23   Kozlow, Camellia PARAS, MD  esomeprazole  (NEXIUM ) 40 MG capsule Take 1 capsule (40 mg total) by mouth 2 (two) times daily. 05/27/23   Kozlow, Camellia PARAS, MD  ezetimibe  (ZETIA ) 10 MG tablet TAKE 1 TABLET EVERY DAY 11/11/22   Karna Fellows, MD  Fluocinolone  Acetonide Scalp (DERMA-SMOOTHE JANA SCALP) 0.01 % OIL Apply 1-7 times a week 05/28/23   Kozlow, Eric J, MD  fluticasone  (FLONASE ) 50 MCG/ACT nasal spray Place 2 sprays into both nostrils daily. 05/27/23   Kozlow, Eric J, MD  glucose blood (TRUE METRIX BLOOD GLUCOSE TEST) test  strip USE AS DIRECTED 03/19/22   Karna Fellows, MD  hydrochlorothiazide  (HYDRODIURIL ) 25 MG tablet Take 1 tablet (25 mg total) by mouth every morning. 10/01/23   Pokhrel, Vernal, MD  hydrOXYzine  (ATARAX ) 25 MG tablet Take 25 mg by mouth 3 (three) times daily as needed. 05/22/23   [provider]  irbesartan  (AVAPRO ) 300 MG tablet Take 1 tablet (300 mg total) by mouth daily. 10/01/23   Pokhrel, Vernal, MD  levothyroxine  (SYNTHROID ) 50 MCG tablet TAKE 1 TABLET EVERY DAY, AT LEAST 30-60 MINUTES BEFORE OTHER FOOD OR MEDICINE. 08/12/22   Karna Fellows, MD  metoprolol  succinate (TOPROL  XL) 50  MG 24 hr tablet Take 1 tablet (50 mg total) by mouth daily. Take with or immediately following a meal. 10/01/23 09/30/24  Pokhrel, Vernal, MD  montelukast  (SINGULAIR ) 10 MG tablet Take 1 tablet (10 mg total) by mouth at bedtime. 05/27/23   Kozlow, Camellia PARAS, MD  Olopatadine -Mometasone  (RYALTRIS ) 665-25 MCG/ACT SUSP Place 2 sprays into the nose 2 (two) times daily. 11/26/22   Kozlow, Camellia PARAS, MD  rosuvastatin  (CRESTOR ) 40 MG tablet TAKE 1 TABLET EVERY DAY (NEED MD APPOINTMENT FOR REFILLS) 11/01/22   Karna Fellows, MD  spironolactone  (ALDACTONE ) 50 MG tablet Take 1.5 tablets (75 mg total) by mouth daily. 10/01/23 12/30/23  Sonjia Vernal, MD     Objective    Physical Exam: Vitals:   11/11/23 2030 11/11/23 2045 11/11/23 2100 11/11/23 2145  BP: (!) 102/49 (!) 96/51 (!) 125/58 (!) 111/50  Pulse: (!) 45 (!) 45 (!) 52 (!) 51  Resp: 13 14 15 15   Temp:      SpO2: 100% 100% 100% 100%  Weight:      Height:        General: appears to be stated age; alert, oriented Skin: warm, dry, no rash Head:  AT/[place] Mouth:  Oral mucosa membranes appear moist, normal dentition Neck: supple; trachea midline Heart:  RRR; did not appreciate any M/R/G Lungs: CTAB, did not appreciate any wheezes, rales, or rhonchi Abdomen: + BS; soft, ND, NT Vascular: 2+ pedal pulses b/l; 2+ radial pulses b/l Extremities: no peripheral edema, no muscle  wasting Neuro: strength and sensation intact in upper and lower extremities b/l ***   *** Neuro: 5/5 strength of the proximal and distal flexors and extensors of the upper and lower extremities bilaterally; sensation intact in upper and lower extremities b/l; cranial nerves II through XII grossly intact; no pronator drift; no evidence suggestive of slurred speech, dysarthria, or facial droop; Normal muscle tone. No tremors.  *** Neuro: In the setting of the patient's current mental status and associated inability to follow instructions, unable to perform full neurologic exam at this time.  As such, assessment of strength, sensation, and cranial nerves is limited at this time. Patient noted to spontaneously move all 4 extremities. No tremors.  ***    Labs on Admission: I have personally reviewed following labs and imaging studies  CBC: Recent Labs  Lab 11/11/23 1935  WBC 6.8  NEUTROABS 3.5  HGB 14.6  HCT 42.5  MCV 78.6*  PLT 183   Basic Metabolic Panel: Recent Labs  Lab 11/11/23 1935  NA 131*  K 3.4*  CL 94*  CO2 21*  GLUCOSE 113*  BUN 69*  CREATININE 3.76*  CALCIUM  9.9   GFR: Estimated Creatinine Clearance: 11 mL/min (A) (by C-G formula based on SCr of 3.76 mg/dL (H)). Liver Function Tests: Recent Labs  Lab 11/11/23 1935  AST 34  ALT 34  ALKPHOS 65  BILITOT 1.3*  PROT 7.7  ALBUMIN 3.6   No results for input(s): LIPASE, AMYLASE in the last 168 hours. No results for input(s): AMMONIA in the last 168 hours. Coagulation Profile: No results for input(s): INR, PROTIME in the last 168 hours. Cardiac Enzymes: Recent Labs  Lab 11/11/23 1935  CKTOTAL 97   BNP (last 3 results) Recent Labs    09/30/23 0025  PROBNP 73.8   HbA1C: No results for input(s): HGBA1C in the last 72 hours. CBG: No results for input(s): GLUCAP in the last 168 hours. Lipid Profile: No results for input(s): CHOL, HDL, LDLCALC, TRIG, CHOLHDL, LDLDIRECT  in the  last 72 hours. Thyroid  Function Tests: No results for input(s): TSH, T4TOTAL, FREET4, T3FREE, THYROIDAB in the last 72 hours. Anemia Panel: No results for input(s): VITAMINB12, FOLATE, FERRITIN, TIBC, IRON, RETICCTPCT in the last 72 hours. Urine analysis:    Component Value Date/Time   COLORURINE YELLOW 11/11/2023 1953   APPEARANCEUR CLEAR 11/11/2023 1953   LABSPEC 1.009 11/11/2023 1953   PHURINE 5.0 11/11/2023 1953   GLUCOSEU >=500 (A) 11/11/2023 1953   HGBUR NEGATIVE 11/11/2023 1953   BILIRUBINUR NEGATIVE 11/11/2023 1953   BILIRUBINUR negative 01/20/2023 1828   BILIRUBINUR Negative 07/24/2021 1419   KETONESUR NEGATIVE 11/11/2023 1953   PROTEINUR NEGATIVE 11/11/2023 1953   UROBILINOGEN 0.2 01/20/2023 1828   UROBILINOGEN 0.2 09/05/2013 1543   NITRITE NEGATIVE 11/11/2023 1953   LEUKOCYTESUR NEGATIVE 11/11/2023 1953    Radiological Exams on Admission: US  Renal Result Date: 11/11/2023 CLINICAL DATA:  aki, r/o obstructive uropathy EXAM: RENAL / URINARY TRACT ULTRASOUND COMPLETE COMPARISON:  None Available. FINDINGS: Right Kidney: Renal measurements: 10.5 x 3.7 x 4.3 cm = volume: 87 mL. Echogenicity within normal limits. No mass or hydronephrosis visualized. Left Kidney: Renal measurements: 11.5 x 5.3 x 4.1 cm = volume: 129 mL. Echogenicity within normal limits. No mass or hydronephrosis visualized. Urinary bladder: Appears normal for degree of bladder distention. Other: None. IMPRESSION: Unremarkable renal ultrasound. Electronically Signed   By: Morgane  Naveau M.D.   On: 11/11/2023 22:01   DG HIP UNILAT W OR W/O PELVIS 2-3 VIEWS RIGHT Result Date: 11/11/2023 CLINICAL DATA:  fall, pain EXAM: DG HIP (WITH OR WITHOUT PELVIS) 2-3V RIGHT COMPARISON:  X-ray pelvis 03/24/2015 FINDINGS: There is no evidence of hip fracture or dislocation of the right hip. No acute displaced fracture or dislocation of the left hip on frontal view. No acute displaced fracture or diastasis of  the bones of the pelvis. Interval development of at least mild degenerative changes of the right hip. No aggressive appearing osseous lesion. Degenerative changes of the lower lumbar spine. IMPRESSION: Negative for acute traumatic injury. Electronically Signed   By: Morgane  Naveau M.D.   On: 11/11/2023 21:44   DG Lumbar Spine Complete Result Date: 11/11/2023 CLINICAL DATA:  fall, pain EXAM: LUMBAR SPINE - COMPLETE 4+ VIEW COMPARISON:  None Available. FINDINGS: There is no evidence of lumbar spine fracture. Moderate facet arthropathy. Mild to moderate degenerative changes of the spine most marked at the L4-L5 level. No severe osseous neural foraminal stenosis. Alignment is normal. Intervertebral disc spaces are maintained. Right upper quadrant surgical clips. Atherosclerotic plaque of the aorta. IMPRESSION: 1. No acute displaced fracture or traumatic listhesis of the lumbar spine. 2.  Aortic Atherosclerosis (ICD10-I70.0). Electronically Signed   By: Morgane  Naveau M.D.   On: 11/11/2023 21:39   DG Chest Port 1 View Result Date: 11/11/2023 CLINICAL DATA:  Question of sepsis to evaluate for abnormality. EXAM: PORTABLE CHEST 1 VIEW COMPARISON:  09/29/2023 FINDINGS: Mild hyperinflation. This likely indicates emphysema. Normal heart size and pulmonary vascularity. No focal airspace disease or consolidation in the lungs. No blunting of costophrenic angles. No pneumothorax. Mediastinal contours appear intact. Calcification of the aorta. IMPRESSION: Emphysematous changes in the lungs. No evidence of active pulmonary disease. Electronically Signed   By: Elsie Gravely M.D.   On: 11/11/2023 19:46      Assessment/Plan   Principal Problem:   AKI (acute kidney  injury)   ***            ***                  ***                   ***                  ***                  ***                  ***                   ***                  ***                  ***                  ***                  ***                 ***                ***  DVT prophylaxis: SCD's ***  Code Status: Full code*** Family Communication: none*** Disposition Plan: Per Rounding Team Consults called: none***;  Admission status: ***     I SPENT GREATER THAN 75 *** MINUTES IN CLINICAL CARE TIME/MEDICAL DECISION-MAKING IN COMPLETING THIS ADMISSION.      [name] NOVAK Asah Lamay DO [facility]  From 7PM - 7AM   11/11/2023, 10:48 PM   ***

## 2023-11-12 ENCOUNTER — Encounter (HOSPITAL_COMMUNITY): Payer: Self-pay | Admitting: Internal Medicine

## 2023-11-12 DIAGNOSIS — D472 Monoclonal gammopathy: Secondary | ICD-10-CM | POA: Diagnosis present

## 2023-11-12 DIAGNOSIS — I10 Essential (primary) hypertension: Secondary | ICD-10-CM | POA: Diagnosis present

## 2023-11-12 DIAGNOSIS — N179 Acute kidney failure, unspecified: Secondary | ICD-10-CM | POA: Diagnosis present

## 2023-11-12 DIAGNOSIS — Z8679 Personal history of other diseases of the circulatory system: Secondary | ICD-10-CM

## 2023-11-12 DIAGNOSIS — E119 Type 2 diabetes mellitus without complications: Secondary | ICD-10-CM | POA: Diagnosis present

## 2023-11-12 DIAGNOSIS — E039 Hypothyroidism, unspecified: Secondary | ICD-10-CM | POA: Diagnosis present

## 2023-11-12 DIAGNOSIS — Z880 Allergy status to penicillin: Secondary | ICD-10-CM | POA: Diagnosis not present

## 2023-11-12 DIAGNOSIS — Z87891 Personal history of nicotine dependence: Secondary | ICD-10-CM | POA: Diagnosis not present

## 2023-11-12 DIAGNOSIS — W1830XA Fall on same level, unspecified, initial encounter: Secondary | ICD-10-CM | POA: Diagnosis present

## 2023-11-12 DIAGNOSIS — R531 Weakness: Secondary | ICD-10-CM

## 2023-11-12 DIAGNOSIS — S7001XA Contusion of right hip, initial encounter: Secondary | ICD-10-CM | POA: Diagnosis present

## 2023-11-12 DIAGNOSIS — Z7951 Long term (current) use of inhaled steroids: Secondary | ICD-10-CM | POA: Diagnosis not present

## 2023-11-12 DIAGNOSIS — E872 Acidosis, unspecified: Secondary | ICD-10-CM | POA: Diagnosis present

## 2023-11-12 DIAGNOSIS — E871 Hypo-osmolality and hyponatremia: Secondary | ICD-10-CM | POA: Diagnosis present

## 2023-11-12 DIAGNOSIS — E8729 Other acidosis: Secondary | ICD-10-CM | POA: Diagnosis present

## 2023-11-12 DIAGNOSIS — E785 Hyperlipidemia, unspecified: Secondary | ICD-10-CM | POA: Diagnosis present

## 2023-11-12 DIAGNOSIS — Z79890 Hormone replacement therapy (postmenopausal): Secondary | ICD-10-CM | POA: Diagnosis not present

## 2023-11-12 DIAGNOSIS — Z8249 Family history of ischemic heart disease and other diseases of the circulatory system: Secondary | ICD-10-CM | POA: Diagnosis not present

## 2023-11-12 DIAGNOSIS — I9589 Other hypotension: Secondary | ICD-10-CM | POA: Diagnosis present

## 2023-11-12 DIAGNOSIS — E876 Hypokalemia: Secondary | ICD-10-CM | POA: Diagnosis present

## 2023-11-12 DIAGNOSIS — Z7984 Long term (current) use of oral hypoglycemic drugs: Secondary | ICD-10-CM | POA: Diagnosis not present

## 2023-11-12 DIAGNOSIS — J4489 Other specified chronic obstructive pulmonary disease: Secondary | ICD-10-CM | POA: Diagnosis present

## 2023-11-12 DIAGNOSIS — E860 Dehydration: Secondary | ICD-10-CM | POA: Diagnosis present

## 2023-11-12 DIAGNOSIS — E861 Hypovolemia: Secondary | ICD-10-CM | POA: Diagnosis present

## 2023-11-12 DIAGNOSIS — Z7982 Long term (current) use of aspirin: Secondary | ICD-10-CM | POA: Diagnosis not present

## 2023-11-12 LAB — CBC WITH DIFFERENTIAL/PLATELET
Abs Immature Granulocytes: 0.03 K/uL (ref 0.00–0.07)
Basophils Absolute: 0.1 K/uL (ref 0.0–0.1)
Basophils Relative: 1 %
Eosinophils Absolute: 0.1 K/uL (ref 0.0–0.5)
Eosinophils Relative: 1 %
HCT: 37.2 % (ref 36.0–46.0)
Hemoglobin: 12.8 g/dL (ref 12.0–15.0)
Immature Granulocytes: 0 %
Lymphocytes Relative: 24 %
Lymphs Abs: 1.6 K/uL (ref 0.7–4.0)
MCH: 26.8 pg (ref 26.0–34.0)
MCHC: 34.4 g/dL (ref 30.0–36.0)
MCV: 78 fL — ABNORMAL LOW (ref 80.0–100.0)
Monocytes Absolute: 0.8 K/uL (ref 0.1–1.0)
Monocytes Relative: 12 %
Neutro Abs: 4.1 K/uL (ref 1.7–7.7)
Neutrophils Relative %: 62 %
Platelets: 157 K/uL (ref 150–400)
RBC: 4.77 MIL/uL (ref 3.87–5.11)
RDW: 13.5 % (ref 11.5–15.5)
WBC: 6.7 K/uL (ref 4.0–10.5)
nRBC: 0 % (ref 0.0–0.2)

## 2023-11-12 LAB — MAGNESIUM: Magnesium: 2.5 mg/dL — ABNORMAL HIGH (ref 1.7–2.4)

## 2023-11-12 LAB — COMPREHENSIVE METABOLIC PANEL WITH GFR
ALT: 29 U/L (ref 0–44)
AST: 30 U/L (ref 15–41)
Albumin: 3 g/dL — ABNORMAL LOW (ref 3.5–5.0)
Alkaline Phosphatase: 53 U/L (ref 38–126)
Anion gap: 17 — ABNORMAL HIGH (ref 5–15)
BUN: 59 mg/dL — ABNORMAL HIGH (ref 8–23)
CO2: 24 mmol/L (ref 22–32)
Calcium: 9.4 mg/dL (ref 8.9–10.3)
Chloride: 95 mmol/L — ABNORMAL LOW (ref 98–111)
Creatinine, Ser: 2.97 mg/dL — ABNORMAL HIGH (ref 0.44–1.00)
GFR, Estimated: 15 mL/min — ABNORMAL LOW (ref >60)
Glucose, Bld: 93 mg/dL (ref 70–99)
Potassium: 3 mmol/L — ABNORMAL LOW (ref 3.5–5.1)
Sodium: 136 mmol/L (ref 135–145)
Total Bilirubin: 1.3 mg/dL — ABNORMAL HIGH (ref 0.0–1.2)
Total Protein: 6.7 g/dL (ref 6.5–8.1)

## 2023-11-12 LAB — CREATININE, URINE, RANDOM: Creatinine, Urine: 150 mg/dL

## 2023-11-12 LAB — OSMOLALITY, URINE: Osmolality, Ur: 344 mosm/kg (ref 300–900)

## 2023-11-12 LAB — CBG MONITORING, ED
Glucose-Capillary: 93 mg/dL (ref 70–99)
Glucose-Capillary: 85 mg/dL (ref 70–99)
Glucose-Capillary: 113 mg/dL — ABNORMAL HIGH (ref 70–99)
Glucose-Capillary: 133 mg/dL — ABNORMAL HIGH (ref 70–99)

## 2023-11-12 LAB — SODIUM, URINE, RANDOM: Sodium, Ur: <30 mmol/L

## 2023-11-12 LAB — TSH: TSH: 1.87 u[IU]/mL (ref 0.350–4.500)

## 2023-11-12 MED ORDER — ROSUVASTATIN CALCIUM 20 MG PO TABS
40.0000 mg | ORAL_TABLET | Freq: Every day | ORAL | Status: DC
  Administered 2023-11-12 – 2023-11-13 (×2): 40 mg via ORAL
  Filled 2023-11-12 (×2): qty 2

## 2023-11-12 MED ORDER — POTASSIUM CHLORIDE 20 MEQ PO PACK
40.0000 meq | PACK | Freq: Once | ORAL | Status: AC
  Administered 2023-11-12: 40 meq via ORAL
  Filled 2023-11-12: qty 2

## 2023-11-12 MED ORDER — MONTELUKAST SODIUM 10 MG PO TABS
10.0000 mg | ORAL_TABLET | Freq: Every day | ORAL | Status: DC
  Administered 2023-11-12: 10 mg via ORAL
  Filled 2023-11-12: qty 1

## 2023-11-12 MED ORDER — ALBUTEROL SULFATE (2.5 MG/3ML) 0.083% IN NEBU: 2.5000 mg | INHALATION_SOLUTION | RESPIRATORY_TRACT | Status: DC | PRN

## 2023-11-12 MED ORDER — FLUTICASONE PROPIONATE 50 MCG/ACT NA SUSP
2.0000 | Freq: Every day | NASAL | Status: DC
  Administered 2023-11-12: 2 via NASAL
  Filled 2023-11-12: qty 16

## 2023-11-12 MED ORDER — LEVOTHYROXINE SODIUM 50 MCG PO TABS
50.0000 ug | ORAL_TABLET | Freq: Every day | ORAL | Status: DC
  Administered 2023-11-12 – 2023-11-13 (×2): 50 ug via ORAL
  Filled 2023-11-12: qty 1
  Filled 2023-11-12: qty 2

## 2023-11-12 MED ORDER — FLUTICASONE FUROATE-VILANTEROL 200-25 MCG/ACT IN AEPB
1.0000 | INHALATION_SPRAY | Freq: Every day | RESPIRATORY_TRACT | Status: DC
  Administered 2023-11-12 – 2023-11-13 (×2): 1 via RESPIRATORY_TRACT
  Filled 2023-11-12 (×2): qty 28

## 2023-11-12 MED ORDER — EZETIMIBE 10 MG PO TABS
10.0000 mg | ORAL_TABLET | Freq: Every day | ORAL | Status: DC
  Administered 2023-11-12 – 2023-11-13 (×2): 10 mg via ORAL
  Filled 2023-11-12 (×2): qty 1

## 2023-11-12 NOTE — Progress Notes (Signed)
 Progress Note   Patient: Andrea Lowery FMW:[id number] DOB: August 11, 1940 DOA: 11/11/2023     0 DOS: the patient was seen and examined on 11/12/2023   Brief hospital course: 83 y.o. female with medical history significant for type 2 diabetes mellitus, hyperlipidemia, essential hypertension, hypothyroidism, MGUS, COPD, who is admitted to [facility] on 11/11/2023 with acute kidney injury after presenting from home to [facility] ED complaining of generalized weakness. she lives at home by herself, and family separately came to visit her, noted her generalized weakness, prompting her to go to her PCP, who drew labs that were notable for acute kidney injury, which prompted PCP to recommend to the patient that she present to the emergency department for further evaluation and management. She is being treated for AKI.  Assessment and Plan:  Acute kidney injury, POA: Likely combination of dehydration as well as use of nephrotoxic medications at home.  Avoid nephrotoxic medications.  Dced intravenous fluids now.  Kidney function is improving.  Baseline creatinine is within normal limits.  Creatinine on admission 3.76.  Creatinine today is 2.97 Follow-up BMP in the morning Encouraged oral hydration Renal ultrasound is unremarkable  Anion gap metabolic acidosis, POA: In the setting of acute kidney injury.  Follow BMP closely.  Right hip pain, POA: Secondary to mechanical fall.  No evidence of acute fractures on the hip imaging.  As needed analgesics.  Follow-up PT/OT recommendations.  Fall precautions.  Physical deconditioning, POA: Leading to fall at home.  Follow-up PT/OT evaluations.  Hypovolemic hyponatremia, POA: Secondary to dehydration.  Resolved with intravenous fluids.  Diabetes mellitus type 2, POA: Holding Jardiance  in the setting of acute kidney injury.  A1c of 6.4% on 10/01/2023.  Continue with sliding scale insulin  and close Accu-Cheks.  Hyperlipidemia, POA: Continue with statin and  Zetia .  Essential hypertension, POA: Continue monitor blood pressure closely.  Continue to hold hydrochlorothiazide  as well as the irbesartan .  Hypothyroidism, POA: Continue Synthroid   Allergic rhinitis, POA: No acute issues  COPD, POA: Without exacerbation.  As needed inhalational bronchodilator therapy   Disposition: Patient lives at home with her son.  She will likely need home health services on discharge.     Subjective: No acute events overnight. She is feeling better. Her son is present at the bedside. She did have a mechanical fall last Thursday. She is complaining of back pain. She is currently living with her son. Her appetite hasn't been great for the past week.  Physical Exam: Vitals:   11/12/23 0415 11/12/23 0450 11/12/23 0800 11/12/23 0906  BP:   100/66   Pulse: (!) 52  (!) 56   Resp: (!) 21  14   Temp:  (!) 97.4 F (36.3 C)  97.6 F (36.4 C)  TempSrc:  Oral  Oral  SpO2: 100%  98%   Weight:      Height:       Constitutional: NAD, calm, comfortable Eyes: PERRL, lids and conjunctivae normal ENMT: Mucous membranes are moist. Posterior pharynx clear of any exudate or lesions.Normal dentition.  Neck: normal, supple, no masses, no thyromegaly Respiratory: clear to auscultation bilaterally, no wheezing, no crackles. Normal respiratory effort. No accessory muscle use.  Cardiovascular: Regular rate and rhythm, no murmurs / rubs / gallops. No extremity edema. 2+ pedal pulses. No carotid bruits.  Abdomen: no tenderness, no masses palpated. No hepatosplenomegaly. Bowel sounds positive.  Musculoskeletal: no clubbing / cyanosis. No joint deformity upper and lower extremities. Good ROM, no contractures. Normal muscle tone.  Skin: no  rashes, lesions, ulcers. No induration Neurologic: CN 2-12 grossly intact. Sensation intact, DTR normal. Strength 5/5 x all 4 extremities.  Psychiatric: Normal judgment and insight. Alert and oriented x 3. Normal mood.   Data Reviewed:  There  are no new results to review at this time.  Family Communication: Son at the bedside  Disposition: Status is: Observation The patient remains OBS appropriate and will d/c before 2 midnights.  Planned Discharge Destination: Home with Home Health    Time spent: 38 minutes  Author: Deliliah Room, MD 11/12/2023 10:55 AM  For on call review [email or web address].

## 2023-11-12 NOTE — Progress Notes (Signed)
 Occupational Therapy Evaluation Patient Details Name: Andrea Lowery MRN: [id number] DOB: Dec 30, 1940 Today's Date: 11/12/2023   History of Present Illness   Pt is an 83 y.o. female who presented 11/11/23 with generalized weakness. Pt admitted with AKI. Of note, pt had a fall x1 week PTA, no evidence of acute fractures on the hip and lumbar imaging. PMH: arthritis, asthma, DM, DVT, dyslipidemia, GERD, HTN, hypokalemia, hypothyroidism, LBBB, MGUS, OSA on CPAP, migraine     Clinical Impressions Patient reports that she is Independent in ADLs and some IADLs such as driving and taking care of her laundry and has relied on her nephew(whom she has been staying with) for other IADLs although reports that she will be going to stay with her son and granddaughter.  Patient reports that she uses a [facility] for functional mobility although would like to try using RW for increased overall safety.  Patient presents with decreased overall strength and CGA to minA  for LB ADLs at this time 2/2 acute back pain fro recent fall.  Patient would benefit from additional OT intervention to address functional deficits of strength, activity tolerance, safety and decreased independence in ADLs in order for patient to return to PLOF. She would benefit from outpatient OT versus HHOT to further improve safety and independece in self care.     If plan is discharge home, recommend the following:   A little help with walking and/or transfers;Assistance with cooking/housework;Assist for transportation;A little help with bathing/dressing/bathroom     Functional Status Assessment   Patient has had a recent decline in their functional status and demonstrates the ability to make significant improvements in function in a reasonable and predictable amount of time.     Equipment Recommendations   None recommended by OT     Recommendations for Other Services         Precautions/Restrictions   Precautions Precautions:  Fall Restrictions Weight Bearing Restrictions Per Provider Order: No     Mobility Bed Mobility Overal bed mobility: Needs Assistance Bed Mobility: Supine to Sit, Sit to Supine     Supine to sit: Supervision, HOB elevated Sit to supine: Supervision, HOB elevated   General bed mobility comments: Extra time, supervision for safety    Transfers Overall transfer level: Needs assistance Equipment used: None, Straight cane Transfers: Sit to/from Stand Sit to Stand: Contact guard assist                  Balance Overall balance assessment: Needs assistance Sitting-balance support: No upper extremity supported, Feet supported Sitting balance-Leahy Scale: Good                                     ADL either performed or assessed with clinical judgement   ADL Overall ADL's : Needs assistance/impaired Eating/Feeding: Independent   Grooming: Wash/dry hands;Wash/dry face;Supervision/safety   Upper Body Bathing: Supervision/ safety   Lower Body Bathing: Minimal assistance   Upper Body Dressing : Supervision/safety   Lower Body Dressing: Minimal assistance Lower Body Dressing Details (indicate cue type and reason):  (2/2 back pain) Toilet Transfer: Contact guard assist   Toileting- Clothing Manipulation and Hygiene: Contact guard assist       Functional mobility during ADLs: Contact guard assist       Vision Baseline Vision/History: 1 Wears glasses Patient Visual Report: No change from baseline Vision Assessment?: No apparent visual deficits     Perception Perception: Within  Functional Limits       Praxis Praxis: WFL       Pertinent Vitals/Pain Pain Assessment Pain Assessment: 0-10 Pain Score: 9  Pain Location: back Pain Descriptors / Indicators: Discomfort, Grimacing, Guarding, Sore Pain Intervention(s): Monitored during session     Extremity/Trunk Assessment Upper Extremity Assessment Upper Extremity Assessment: Generalized  weakness   Lower Extremity Assessment Lower Extremity Assessment: Defer to PT evaluation   Cervical / Trunk Assessment Cervical / Trunk Assessment: Normal   Communication Communication Communication: No apparent difficulties   Cognition Arousal: Alert Behavior During Therapy: WFL for tasks assessed/performed Cognition: No apparent impairments                               Following commands: Intact       Cueing  General Comments   Cueing Techniques: Verbal cues      Exercises     Shoulder Instructions      Home Living Family/patient expects to be discharged to:: Private residence Living Arrangements: Spouse/significant other Available Help at Discharge: Family;Available 24 hours/day Type of Home: House Home Access: Stairs to enter [employer] of Steps: 3 Entrance Stairs-Rails: Left Home Layout: One level     Bathroom Shower/Tub: Chief Strategy Officer: Standard     Home Equipment: Cane - single point   Additional Comments: Was living alone but plans to go live with son after d/c, his home info is above      Prior Functioning/Environment Prior Level of Function : Independent/Modified Independent             Mobility Comments: No AD, x1 fall in past 6 months which was due to door swinging back at her ADLs Comments: Independent, 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    OT Problem List: Decreased strength;Decreased activity tolerance   OT Treatment/Interventions: Self-care/ADL training;Therapeutic exercise;Therapeutic activities;Patient/family education;DME and/or AE instruction      OT Goals(Current goals can be found in the care plan section)   Acute Rehab OT Goals OT Goal Formulation: With patient Time For Goal Achievement: 11/26/23 Potential to Achieve Goals: Good   OT Frequency:  Min 2X/week    Co-evaluation              AM-PAC OT 6 Clicks Daily Activity     Outcome Measure Help from another person eating  meals?: None Help from another person taking care of personal grooming?: A Little Help from another person toileting, which includes using toliet, bedpan, or urinal?: A Little Help from another person bathing (including washing, rinsing, drying)?: A Little Help from another person to put on and taking off regular upper body clothing?: None Help from another person to put on and taking off regular lower body clothing?: A Little 6 Click Score: 20   End of Session Nurse Communication: Mobility status  Activity Tolerance: Patient tolerated treatment well Patient left: in bed;with call bell/phone within reach  OT Visit Diagnosis: Unsteadiness on feet (R26.81);Muscle weakness (generalized) (M62.81)                Time: 9044-8990 OT Time Calculation (min): 14 min Charges:  OT General Charges $OT Visit: 1 Visit OT Evaluation $OT Eval Moderate Complexity: 1 Mod  [address] OT/L  Lamarr JONETTA Pouch 11/12/2023, 2:47 PM

## 2023-11-12 NOTE — Evaluation (Signed)
 Physical Therapy Evaluation Patient Details Name: OTHA Lowery MRN: [id number] DOB: 05-Sep-1940 Today's Date: 11/12/2023  History of Present Illness  Pt is an 83 y.o. female who presented 11/11/23 with generalized weakness. Pt admitted with AKI. Of note, pt had a fall x1 week PTA, no evidence of acute fractures on the hip and lumbar imaging. PMH: arthritis, asthma, DM, DVT, dyslipidemia, GERD, HTN, hypokalemia, hypothyroidism, LBBB, MGUS, OSA on CPAP, migraine   Clinical Impression  Pt presents with condition above and deficits mentioned below, see PT Problem List. PTA, she was independent without DME and living alone. She reports she plans to d/c home to live with her son in his 1-level house with 3 STE though. Currently, the pt displays deficits in balance, generalized strength, and activity tolerance. She does display a mildly unsteady, antalgic gait pattern when trying to ambulate without UE support. She displays a steady gait pattern when using a RW for support though. She is not heavily reliant on the RW, thus recommending pt d/c home with a rollator for standing functional mobility. She could benefit from follow-up with OPPT at d/c to maximize her return to baseline as well. Will continue to follow acutely.      If plan is discharge home, recommend the following: A little help with walking and/or transfers;A little help with bathing/dressing/bathroom;Assistance with cooking/housework;Help with stairs or ramp for entrance;Assist for transportation   Can travel by private vehicle        Equipment Recommendations Rollator (4 wheels)  Recommendations for Other Services       Functional Status Assessment Patient has had a recent decline in their functional status and demonstrates the ability to make significant improvements in function in a reasonable and predictable amount of time.     Precautions / Restrictions Precautions Precautions: Fall Restrictions Weight Bearing  Restrictions Per Provider Order: No      Mobility  Bed Mobility Overal bed mobility: Needs Assistance Bed Mobility: Supine to Sit, Sit to Supine     Supine to sit: Supervision, HOB elevated Sit to supine: Supervision, HOB elevated   General bed mobility comments: Extra time, supervision for safety    Transfers Overall transfer level: Needs assistance Equipment used: Rolling walker (2 wheels), None Transfers: Sit to/from Stand Sit to Stand: Contact guard assist           General transfer comment: Pt stood from edge of stretcher several times without DME and 1x with RW, no LOB, CGA for safety    Ambulation/Gait Ambulation/Gait assistance: Contact guard assist Gait Distance (Feet): 140 Feet Assistive device: Rolling walker (2 wheels), None Gait Pattern/deviations: Step-through pattern, Decreased stance time - right, Decreased weight shift to right, Decreased stride length, Antalgic Gait velocity: reduced Gait velocity interpretation: <1.8 ft/sec, indicate of risk for recurrent falls   General Gait Details: Pt needs repeated cues to remain a little more proximally to her RW. When the RW was removed, the pt displayed a much more evident antalgic gait pattern with reduced R weight shift due to pain. CGA for safety with pt demonstrating improved stability when using the RW than when not using an AD  Stairs            Wheelchair Mobility     Tilt Bed    Modified Rankin (Stroke Patients Only)       Balance Overall balance assessment: Needs assistance Sitting-balance support: No upper extremity supported, Feet supported Sitting balance-Leahy Scale: Good     Standing balance support: Bilateral upper extremity  supported, No upper extremity supported, During functional activity Standing balance-Leahy Scale: Fair Standing balance comment: able to stand without UE support but benefits from RW to ambulate                             Pertinent  Vitals/Pain Pain Assessment Pain Assessment: 0-10 Pain Score: 8  Pain Location: back Pain Descriptors / Indicators: Discomfort, Grimacing, Guarding, Sore Pain Intervention(s): Limited activity within patient's tolerance, Monitored during session, Repositioned    Home Living Family/patient expects to be discharged to:: Private residence Living Arrangements: Spouse/significant other (son and his family) Available Help at Discharge: Family;Available 24 hours/day Type of Home: House Home Access: Stairs to enter Entrance Stairs-Rails: Left Entrance Stairs-Number of Steps: 3   Home Layout: One level Home Equipment: Cane - single point Additional Comments: Was living alone but plans to go live with son after d/c, his home info is above    Prior Function Prior Level of Function : Independent/Modified Independent             Mobility Comments: No AD, x1 fall in past 6 months which was due to door swinging back at her ADLs Comments: Independent, 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     Extremity/Trunk Assessment   Upper Extremity Assessment Upper Extremity Assessment: Defer to OT evaluation    Lower Extremity Assessment Lower Extremity Assessment: Generalized weakness;RLE deficits/detail RLE Deficits / Details: R hip pain s/p fall x1 week PTA; bruising noted on buttocks; strength 4/5 hip flexion bil, 4+ knee extension bil, 4+ ankle dorsiflexion bil; senied numbness/tingling bil    Cervical / Trunk Assessment Cervical / Trunk Assessment: Normal  Communication   Communication Communication: No apparent difficulties    Cognition Arousal: Alert Behavior During Therapy: WFL for tasks assessed/performed   PT - Cognitive impairments: No apparent impairments                         Following commands: Intact       Cueing Cueing Techniques: Verbal cues     General Comments General comments (skin integrity, edema, etc.): Educated pt and son on safe management of rollator and  application of brakes with transfers. They verbalized understanding    Exercises     Assessment/Plan    PT Assessment Patient needs continued PT services  PT Problem List Decreased strength;Decreased activity tolerance;Decreased balance;Decreased mobility;Pain       PT Treatment Interventions DME instruction;Gait training;Functional mobility training;Stair training;Therapeutic activities;Balance training;Therapeutic exercise;Neuromuscular re-education;Patient/family education    PT Goals (Current goals can be found in the Care Plan section)  Acute Rehab PT Goals Patient Stated Goal: to improve and be able to walk in high heels again PT Goal Formulation: With patient/family Time For Goal Achievement: 11/26/23 Potential to Achieve Goals: Good    Frequency Min 2X/week     Co-evaluation               AM-PAC PT 6 Clicks Mobility  Outcome Measure Help needed turning from your back to your side while in a flat bed without using bedrails?: A Little Help needed moving from lying on your back to sitting on the side of a flat bed without using bedrails?: A Little Help needed moving to and from a bed to a chair (including a wheelchair)?: A Little Help needed standing up from a chair using your arms (e.g., wheelchair or bedside chair)?: A Little Help needed to walk in hospital  room?: A Little Help needed climbing 3-5 steps with a railing? : A Little 6 Click Score: 18    End of Session Equipment Utilized During Treatment: Gait belt Activity Tolerance: Patient tolerated treatment well Patient left: in bed;with call bell/phone within reach;with family/visitor present   PT Visit Diagnosis: Unsteadiness on feet (R26.81);Other abnormalities of gait and mobility (R26.89);Muscle weakness (generalized) (M62.81);Difficulty in walking, not elsewhere classified (R26.2);History of falling (Z91.81);Pain Pain - Right/Left: Right Pain - part of body: Hip    Time: 8951-8891 PT Time  Calculation (min) (ACUTE ONLY): 20 min   Charges:   PT Evaluation $PT Eval Low Complexity: 1 Low   PT General Charges $$ ACUTE PT VISIT: 1 Visit         Theo Ferretti, PT, DPT Acute Rehabilitation Services  Office: [phone]   Theo CHRISTELLA Ferretti 11/12/2023, 11:18 AM

## 2023-11-12 NOTE — ED Notes (Addendum)
CBG 133 

## 2023-11-13 ENCOUNTER — Other Ambulatory Visit (HOSPITAL_COMMUNITY): Payer: Self-pay

## 2023-11-13 DIAGNOSIS — N179 Acute kidney failure, unspecified: Secondary | ICD-10-CM | POA: Diagnosis not present

## 2023-11-13 LAB — BASIC METABOLIC PANEL WITH GFR
Anion gap: 14 (ref 5–15)
BUN: 42 mg/dL — ABNORMAL HIGH (ref 8–23)
CO2: 22 mmol/L (ref 22–32)
Calcium: 9.5 mg/dL (ref 8.9–10.3)
Chloride: 100 mmol/L (ref 98–111)
Creatinine, Ser: 2.2 mg/dL — ABNORMAL HIGH (ref 0.44–1.00)
GFR, Estimated: 22 mL/min — ABNORMAL LOW (ref >60)
Glucose, Bld: 95 mg/dL (ref 70–99)
Potassium: 3.7 mmol/L (ref 3.5–5.1)
Sodium: 136 mmol/L (ref 135–145)

## 2023-11-13 LAB — URIC ACID: Uric Acid, Serum: 10 mg/dL — ABNORMAL HIGH (ref 2.5–7.1)

## 2023-11-13 LAB — PROTIME-INR
INR: 1.1 (ref 0.8–1.2)
Prothrombin Time: 14.5 s (ref 11.4–15.2)

## 2023-11-13 LAB — GLUCOSE, CAPILLARY: Glucose-Capillary: 90 mg/dL (ref 70–99)

## 2023-11-13 LAB — OSMOLALITY: Osmolality: 297 mosm/kg — ABNORMAL HIGH (ref 275–295)

## 2023-11-13 MED ORDER — ALLOPURINOL 100 MG PO TABS
100.0000 mg | ORAL_TABLET | Freq: Every day | ORAL | Status: DC
  Administered 2023-11-13: 100 mg via ORAL
  Filled 2023-11-13: qty 1

## 2023-11-13 MED ORDER — ALLOPURINOL 100 MG PO TABS
100.0000 mg | ORAL_TABLET | Freq: Every day | ORAL | 0 refills | Status: AC
  Filled 2023-11-13: qty 30, 30d supply, fill #0

## 2023-11-13 NOTE — TOC CM/SW Note (Signed)
 Transition of Care [facility]) - Inpatient Brief Assessment   Patient Details  Name: Andrea Lowery MRN: [id number] Date of Birth: 30-Jul-1940  Transition of Care [facility]) CM/SW Contact:    Andrea Barnie Rama, RN Phone Number: 11/13/2023, 10:37 AM   Clinical Narrative: From home with son, has PCP Andrea Lowery and has an apt tomorrow at 5 pm  and insurance on file, states has no HH services in place at this time , has a cane at home.  States family member will transport them home at [employer] and family is support system, states gets medications from [facility] on [address].  Pta self ambulatory with cane. Per pt eval rec outpatient PT and OT,  NCM made referral thru epic for outpatient rehab on [facility].         Transition of Care Asessment: Insurance and Status: Insurance coverage has been reviewed Patient has primary care physician: Yes Home environment has been reviewed: from home with son Prior level of function:: ambulatory with cane Prior/Current Home Services: No current home services Social Drivers of Health Review: SDOH reviewed no interventions necessary Readmission risk has been reviewed: Yes Transition of care needs: transition of care needs identified, TOC will continue to follow

## 2023-11-13 NOTE — Plan of Care (Signed)
  Problem: Clinical Measurements: Goal: Will remain free from infection Outcome: Progressing Goal: Respiratory complications will improve Outcome: Progressing Goal: Cardiovascular complication will be avoided Outcome: Progressing   Problem: Activity: Goal: Risk for activity intolerance will decrease Outcome: Progressing   Problem: Safety: Goal: Ability to remain free from injury will improve Outcome: Progressing

## 2023-11-13 NOTE — TOC Transition Note (Addendum)
 Transition of Care [facility]) - Discharge Note   Patient Details  Name: Andrea Lowery MRN: [id number] Date of Birth: Apr 20, 1940  [facility]) CM/SW Contact:  [name], RN Phone Number: 11/13/2023, 10:39 AM   Clinical Narrative:    For dc home today, son at beside to transport her home.  Daugter n law asked to have a bsc as well.  [employer] will deliver.          Patient Goals and CMS Choice            Discharge Placement                       Discharge Plan and Services Additional resources added to the After Visit Summary for                                       Social Drivers of Health (SDOH) Interventions SDOH Screenings   Food Insecurity: No Food Insecurity (11/12/2023)  Housing: Low Risk  (11/12/2023)  Transportation Needs: No Transportation Needs (11/12/2023)  Utilities: Not At Risk (11/12/2023)  Alcohol  Screen: Low Risk  (07/31/2022)  Depression (PHQ2-9): Medium Risk (09/17/2022)  Financial Resource Strain: Low Risk  (07/31/2022)  Physical Activity: Inactive (07/31/2022)  Social Connections: Moderately Integrated (11/12/2023)  Stress: Stress Concern Present (07/31/2022)  Tobacco Use: Medium Risk (11/12/2023)     Readmission Risk Interventions    11/13/2023   10:35 AM  Readmission Risk Prevention Plan  Post Dischage Appt Complete  Medication Screening Complete  Transportation Screening Complete

## 2023-11-13 NOTE — Progress Notes (Addendum)
 Discharge instructions reviewed with pt and her son.  Copy of instructions given to pt. [facility] has filled one script for pt and will be picked up on the way out. Pt's rollator walker was delivered to the room and was taken home with her and her son.  Pt will be d/c'd via wheelchair with belongings and will be escorted by staff.   Akshar Starnes,RN SWOT

## 2023-11-13 NOTE — Discharge Summary (Signed)
 Physician Discharge Summary   Patient: Andrea Lowery MRN: [id number] DOB: May 18, 1940  Admit date:     11/11/2023  Discharge date: 11/13/23  Discharge Physician: Deliliah Room   PCP: Patient, No Pcp Per   Recommendations at discharge:    F/u with your PCP in one week. Needs renal function panel to be checked in a week. Continue checking BP and heart rate daily.  Discharge Diagnoses: Principal Problem:   AKI (acute kidney injury) Active Problems:   Allergic rhinitis   Acute right hip pain   Hypokalemia   HLD (hyperlipidemia)   Acquired hypothyroidism   High anion gap metabolic acidosis   Generalized weakness   Acute hyponatremia   DM2 (diabetes mellitus, type 2) (HCC)   History of essential hypertension  Hospital Course:  83 y.o. female with medical history significant for type 2 diabetes mellitus, hyperlipidemia, essential hypertension, hypothyroidism, MGUS, COPD, who is admitted to [facility] on 11/11/2023 with acute kidney injury after presenting from home to [facility] ED complaining of generalized weakness. she lives at home by herself, and family separately came to visit her, noted her generalized weakness, prompting her to go to her PCP, who drew labs that were notable for acute kidney injury, which prompted PCP to recommend to the patient that she present to the emergency department for further evaluation and management.  She was treated for AKI in the setting of dehydration and possibly nephrotoxic medication usage. She was given IVF and her creatinine improved, she was able to maintain adequate oral hydration after discontinuation of IVF. Her son was present at the bedside. I strongly advised to check her BP and heart rate daily. Her PCP will decide whether to initiate antihypertensives or not, depending on her BP readings at home.  Patient was ambulating in the room without any assistance on the day of the discharge.         Consultants: none Procedures  performed: None  Disposition: Home Diet recommendation:  Cardiac diet DISCHARGE MEDICATION: Allergies as of 11/13/2023       Reactions   Bee Venom Anaphylaxis   Yellow jackets   Penicillins Anaphylaxis, Hives        Medication List     STOP taking these medications    amLODipine  5 MG tablet Commonly known as: NORVASC    diazepam  2 MG tablet Commonly known as: VALIUM    empagliflozin  25 MG Tabs tablet Commonly known as: Jardiance    hydrochlorothiazide  25 MG tablet Commonly known as: HYDRODIURIL    irbesartan  300 MG tablet Commonly known as: AVAPRO    metoprolol  succinate 50 MG 24 hr tablet Commonly known as: Toprol  XL   spironolactone  50 MG tablet Commonly known as: ALDACTONE        TAKE these medications    albuterol  108 (90 Base) MCG/ACT inhaler Commonly known as: VENTOLIN  HFA INHALE 2 PUFFS EVERY 4 HOURS AS NEEDED FOR WHEEZING OR SHORTNESS OF BREATH   allopurinol 100 MG tablet Commonly known as: ZYLOPRIM Take 1 tablet (100 mg total) by mouth daily.   aspirin  EC 81 MG tablet Take 1 tablet (81 mg total) by mouth daily. Swallow whole.   budesonide -formoterol  160-4.5 MCG/ACT inhaler Commonly known as: Symbicort  INHALE 2 PUFFS BY MOUTH INTO THE LUNGS TWICE DAILY RINSE GARGLE AND SPIT AFTER USE What changed:  how much to take how to take this when to take this additional instructions   EPINEPHrine  0.3 mg/0.3 mL Soaj injection Commonly known as: EPI-PEN Inject 0.3 mg into the muscle as needed for anaphylaxis.  Use as directed for severe allergic reaction   esomeprazole  40 MG capsule Commonly known as: NEXIUM  Take 1 capsule (40 mg total) by mouth 2 (two) times daily.   ezetimibe  10 MG tablet Commonly known as: ZETIA  TAKE 1 TABLET EVERY DAY   Fasenra  Pen 30 MG/ML prefilled autoinjector Generic drug: benralizumab  Inject 1 mL (30 mg total) into the skin every 8 (eight) weeks.   Fluocinolone  Acetonide Scalp 0.01 % Oil Commonly known as:  Derma-Smoothe /FS Scalp Apply 1-7 times a week   fluticasone  50 MCG/ACT nasal spray Commonly known as: FLONASE  Place 2 sprays into both nostrils daily.   levothyroxine  50 MCG tablet Commonly known as: SYNTHROID  TAKE 1 TABLET EVERY DAY, AT LEAST 30-60 MINUTES BEFORE OTHER FOOD OR MEDICINE.   montelukast  10 MG tablet Commonly known as: SINGULAIR  Take 1 tablet (10 mg total) by mouth at bedtime.   Polyethyl Glycol-Propyl Glycol 0.4-0.3 % Gel ophthalmic gel Commonly known as: SYSTANE Place 1 Application into both eyes as needed (Dry eyes).   rosuvastatin  40 MG tablet Commonly known as: CRESTOR  TAKE 1 TABLET EVERY DAY (NEED MD APPOINTMENT FOR REFILLS)        Discharge Exam: Filed Weights   11/11/23 1852 11/12/23 2351 11/13/23 0416  Weight: 65.8 kg 71.8 kg 71 kg   Constitutional: NAD, calm, comfortable Eyes: PERRL, lids and conjunctivae normal ENMT: Mucous membranes are moist. Posterior pharynx clear of any exudate or lesions.Normal dentition.  Neck: normal, supple, no masses, no thyromegaly Respiratory: clear to auscultation bilaterally, no wheezing, no crackles. Normal respiratory effort. No accessory muscle use.  Cardiovascular: Regular rate and rhythm, no murmurs / rubs / gallops. No extremity edema. 2+ pedal pulses. No carotid bruits.  Abdomen: no tenderness, no masses palpated. No hepatosplenomegaly. Bowel sounds positive.  Musculoskeletal: no clubbing / cyanosis. No joint deformity upper and lower extremities. Good ROM, no contractures. Normal muscle tone.  Skin: no rashes, lesions, ulcers. No induration Neurologic: CN 2-12 grossly intact. Sensation intact, DTR normal. Strength 5/5 x all 4 extremities.  Psychiatric: Normal judgment and insight. Alert and oriented x 3. Normal mood.    Condition at discharge: good  The results of significant diagnostics from this hospitalization (including imaging, microbiology, ancillary and laboratory) are listed below for reference.    Imaging Studies: US  Renal Result Date: 11/11/2023 CLINICAL DATA:  aki, r/o obstructive uropathy EXAM: RENAL / URINARY TRACT ULTRASOUND COMPLETE COMPARISON:  None Available. FINDINGS: Right Kidney: Renal measurements: 10.5 x 3.7 x 4.3 cm = volume: 87 mL. Echogenicity within normal limits. No mass or hydronephrosis visualized. Left Kidney: Renal measurements: 11.5 x 5.3 x 4.1 cm = volume: 129 mL. Echogenicity within normal limits. No mass or hydronephrosis visualized. Urinary bladder: Appears normal for degree of bladder distention. Other: None. IMPRESSION: Unremarkable renal ultrasound. Electronically Signed   By: Morgane  Naveau M.D.   On: 11/11/2023 22:01   DG HIP UNILAT W OR W/O PELVIS 2-3 VIEWS RIGHT Result Date: 11/11/2023 CLINICAL DATA:  fall, pain EXAM: DG HIP (WITH OR WITHOUT PELVIS) 2-3V RIGHT COMPARISON:  X-ray pelvis 03/24/2015 FINDINGS: There is no evidence of hip fracture or dislocation of the right hip. No acute displaced fracture or dislocation of the left hip on frontal view. No acute displaced fracture or diastasis of the bones of the pelvis. Interval development of at least mild degenerative changes of the right hip. No aggressive appearing osseous lesion. Degenerative changes of the lower lumbar spine. IMPRESSION: Negative for acute traumatic injury. Electronically Signed   By: Morgane  Margarite M.D.   On: 11/11/2023 21:44   DG Lumbar Spine Complete Result Date: 11/11/2023 CLINICAL DATA:  fall, pain EXAM: LUMBAR SPINE - COMPLETE 4+ VIEW COMPARISON:  None Available. FINDINGS: There is no evidence of lumbar spine fracture. Moderate facet arthropathy. Mild to moderate degenerative changes of the spine most marked at the L4-L5 level. No severe osseous neural foraminal stenosis. Alignment is normal. Intervertebral disc spaces are maintained. Right upper quadrant surgical clips. Atherosclerotic plaque of the aorta. IMPRESSION: 1. No acute displaced fracture or traumatic listhesis of the lumbar  spine. 2.  Aortic Atherosclerosis (ICD10-I70.0). Electronically Signed   By: Morgane  Naveau M.D.   On: 11/11/2023 21:39   DG Chest Port 1 View Result Date: 11/11/2023 CLINICAL DATA:  Question of sepsis to evaluate for abnormality. EXAM: PORTABLE CHEST 1 VIEW COMPARISON:  09/29/2023 FINDINGS: Mild hyperinflation. This likely indicates emphysema. Normal heart size and pulmonary vascularity. No focal airspace disease or consolidation in the lungs. No blunting of costophrenic angles. No pneumothorax. Mediastinal contours appear intact. Calcification of the aorta. IMPRESSION: Emphysematous changes in the lungs. No evidence of active pulmonary disease. Electronically Signed   By: Elsie Gravely M.D.   On: 11/11/2023 19:46   VAS US  CAROTID Result Date: 10/30/2023 Carotid Arterial Duplex Study Patient Name:  CHRISTNA KULICK  Date of Exam:   10/30/2023 Medical Rec #: [id number]            Accession #:    [id number] Date of Birth: Dec 16, 1940             Patient Gender: F Patient Age:   14 years Exam Location:  [address] Procedure:      VAS US  CAROTID Referring Phys: SHENG HALEY --------------------------------------------------------------------------------  Risk Factors:      Hypertension, Diabetes. Comparison Study:  02/14/16 Performing Technologist: Garnette Rockers  Examination Guidelines: A complete evaluation includes B-mode imaging, spectral Doppler, color Doppler, and power Doppler as needed of all accessible portions of each vessel. Bilateral testing is considered an integral part of a complete examination. Limited examinations for reoccurring indications may be performed as noted.  Right Carotid Findings: +----------+-------+--------+--------+----------------------+------------------+           PSV    EDV cm/sStenosisPlaque Description    Comments                     cm/s                                                             +----------+-------+--------+--------+----------------------+------------------+ CCA Prox  58     6                                     intimal thickening +----------+-------+--------+--------+----------------------+------------------+ CCA Distal63     6                                     intimal thickening +----------+-------+--------+--------+----------------------+------------------+ ICA Prox  94     4       1-39%   diffuse and  heterogenous                             +----------+-------+--------+--------+----------------------+------------------+ ICA Mid   84     14                                                       +----------+-------+--------+--------+----------------------+------------------+ ICA Distal111    17                                                       +----------+-------+--------+--------+----------------------+------------------+ ECA       85     6                                                        +----------+-------+--------+--------+----------------------+------------------+ +----------+--------+-------+--------+-------------------+           PSV cm/sEDV cmsDescribeArm Pressure (mmHG) +----------+--------+-------+--------+-------------------+ Subclavian99                     157                 +----------+--------+-------+--------+-------------------+ +---------+--------+--+--------+-+ VertebralPSV cm/s50EDV cm/s8 +---------+--------+--+--------+-+  Left Carotid Findings: +----------+--------+--------+--------+---------------------+------------------+           PSV cm/sEDV cm/sStenosisPlaque Description   Comments           +----------+--------+--------+--------+---------------------+------------------+ CCA Prox  68      5                                                        +----------+--------+--------+--------+---------------------+------------------+ CCA Distal59      11                                   intimal thickening +----------+--------+--------+--------+---------------------+------------------+ ICA Prox  56      13      1-39%   diffuse, hyperechoic                                                      and heterogenous                        +----------+--------+--------+--------+---------------------+------------------+ ICA Distal92      17                                                      +----------+--------+--------+--------+---------------------+------------------+ ECA       65  5                                                       +----------+--------+--------+--------+---------------------+------------------+ +----------+--------+--------+--------+-------------------+           PSV cm/sEDV cm/sDescribeArm Pressure (mmHG) +----------+--------+--------+--------+-------------------+ [id number]                      147                 +----------+--------+--------+--------+-------------------+ +---------+--------+--+--------+-+ VertebralPSV cm/s54EDV cm/s9 +---------+--------+--+--------+-+   Summary: Right Carotid: Velocities in the right ICA are consistent with a 1-39% stenosis. Left Carotid: Velocities in the left ICA are consistent with a 1-39% stenosis. Vertebrals:  Bilateral vertebral arteries demonstrate antegrade flow. Subclavians: Normal flow hemodynamics were seen in bilateral subclavian              arteries. *See table(s) above for measurements and observations.  Electronically signed by Dorn Lesches MD on 10/30/2023 at 2:52:32 PM.    Final     Microbiology: Results for orders placed or performed during the hospital encounter of 11/11/23  Resp panel by RT-PCR (RSV, Flu A&B, Covid) Anterior Nasal Swab     Status: None   Collection Time: 11/11/23  7:35 PM   Specimen: Anterior Nasal Swab   Result Value Ref Range Status   SARS Coronavirus 2 by RT PCR NEGATIVE NEGATIVE Final   Influenza A by PCR NEGATIVE NEGATIVE Final   Influenza B by PCR NEGATIVE NEGATIVE Final    Comment: (NOTE) The Xpert Xpress SARS-CoV-2/FLU/RSV plus assay is intended as an aid in the diagnosis of influenza from Nasopharyngeal swab specimens and should not be used as a sole basis for treatment. Nasal washings and aspirates are unacceptable for Xpert Xpress SARS-CoV-2/FLU/RSV testing.  Fact Sheet for Patients: BloggerCourse.com  Fact Sheet for Healthcare Providers: SeriousBroker.it  This test is not yet approved or cleared by the [place]  FDA and has been authorized for detection and/or diagnosis of SARS-CoV-2 by FDA under an Emergency Use Authorization (EUA). This EUA will remain in effect (meaning this test can be used) for the duration of the COVID-19 declaration under Section 564(b)(1) of the Act, 21 U.S.C. section 360bbb-3(b)(1), unless the authorization is terminated or revoked.     Resp Syncytial Virus by PCR NEGATIVE NEGATIVE Final    Comment: (NOTE) Fact Sheet for Patients: BloggerCourse.com  Fact Sheet for Healthcare Providers: SeriousBroker.it  This test is not yet approved or cleared by the [place]  FDA and has been authorized for detection and/or diagnosis of SARS-CoV-2 by FDA under an Emergency Use Authorization (EUA). This EUA will remain in effect (meaning this test can be used) for the duration of the COVID-19 declaration under Section 564(b)(1) of the Act, 21 U.S.C. section 360bbb-3(b)(1), unless the authorization is terminated or revoked.  Performed at [facility], [address], [place], [place] [address]    *Note: Due to a large number of results and/or encounters for the requested time period, some results have not been displayed. A complete set of  results can be found in Results Review.    Labs: CBC: Recent Labs  Lab 11/11/23 1935 11/12/23 0552  WBC 6.8 6.7  NEUTROABS 3.5 4.1  HGB 14.6 12.8  HCT 42.5 37.2  MCV 78.6* 78.0*  PLT 183 157  Basic Metabolic Panel: Recent Labs  Lab 11/11/23 1935 11/12/23 0552 11/13/23 0246  NA 131* 136 136  K 3.4* 3.0* 3.7  CL 94* 95* 100  CO2 21* 24 22  GLUCOSE 113* 93 95  BUN 69* 59* 42*  CREATININE 3.76* 2.97* 2.20*  CALCIUM  9.9 9.4 9.5  MG  --  2.5*  --    Liver Function Tests: Recent Labs  Lab 11/11/23 1935 11/12/23 0552  AST 34 30  ALT 34 29  ALKPHOS 65 53  BILITOT 1.3* 1.3*  PROT 7.7 6.7  ALBUMIN 3.6 3.0*   CBG: Recent Labs  Lab 11/12/23 0802 11/12/23 1212 11/12/23 1643 11/12/23 2111 11/13/23 0627  GLUCAP 93 85 113* 133* 90    Discharge time spent:43 minutes.  Signed: Deliliah Room, MD [facility] 11/13/2023

## 2023-11-16 LAB — CULTURE, BLOOD (ROUTINE X 2)
Culture: NO GROWTH
Special Requests: ADEQUATE

## 2023-11-17 ENCOUNTER — Telehealth: Payer: Self-pay

## 2023-11-20 ENCOUNTER — Encounter (HOSPITAL_COMMUNITY): Payer: Self-pay

## 2023-11-20 ENCOUNTER — Inpatient Hospital Stay (HOSPITAL_COMMUNITY)
Admission: EM | Admit: 2023-11-20 | Discharge: 2023-11-24 | DRG: 683 | Disposition: A | Discharge status: Home / Self Care | Attending: Internal Medicine | Admitting: Internal Medicine

## 2023-11-20 ENCOUNTER — Observation Stay (HOSPITAL_COMMUNITY)

## 2023-11-20 DIAGNOSIS — Z79899 Other long term (current) drug therapy: Secondary | ICD-10-CM

## 2023-11-20 DIAGNOSIS — E119 Type 2 diabetes mellitus without complications: Secondary | ICD-10-CM | POA: Diagnosis present

## 2023-11-20 DIAGNOSIS — N179 Acute kidney failure, unspecified: Principal | ICD-10-CM | POA: Diagnosis present

## 2023-11-20 DIAGNOSIS — E785 Hyperlipidemia, unspecified: Secondary | ICD-10-CM | POA: Diagnosis present

## 2023-11-20 DIAGNOSIS — Z7982 Long term (current) use of aspirin: Secondary | ICD-10-CM

## 2023-11-20 DIAGNOSIS — D696 Thrombocytopenia, unspecified: Secondary | ICD-10-CM | POA: Diagnosis present

## 2023-11-20 DIAGNOSIS — H8110 Benign paroxysmal vertigo, unspecified ear: Secondary | ICD-10-CM | POA: Diagnosis present

## 2023-11-20 DIAGNOSIS — Z7951 Long term (current) use of inhaled steroids: Secondary | ICD-10-CM

## 2023-11-20 DIAGNOSIS — K219 Gastro-esophageal reflux disease without esophagitis: Secondary | ICD-10-CM | POA: Diagnosis present

## 2023-11-20 DIAGNOSIS — R627 Adult failure to thrive: Secondary | ICD-10-CM | POA: Diagnosis present

## 2023-11-20 DIAGNOSIS — R001 Bradycardia, unspecified: Secondary | ICD-10-CM | POA: Diagnosis present

## 2023-11-20 DIAGNOSIS — Z7984 Long term (current) use of oral hypoglycemic drugs: Secondary | ICD-10-CM

## 2023-11-20 DIAGNOSIS — Z515 Encounter for palliative care: Secondary | ICD-10-CM

## 2023-11-20 DIAGNOSIS — E861 Hypovolemia: Secondary | ICD-10-CM | POA: Diagnosis present

## 2023-11-20 DIAGNOSIS — Z79890 Hormone replacement therapy (postmenopausal): Secondary | ICD-10-CM

## 2023-11-20 DIAGNOSIS — E875 Hyperkalemia: Secondary | ICD-10-CM | POA: Diagnosis present

## 2023-11-20 DIAGNOSIS — E876 Hypokalemia: Secondary | ICD-10-CM | POA: Diagnosis present

## 2023-11-20 DIAGNOSIS — I110 Hypertensive heart disease with heart failure: Secondary | ICD-10-CM | POA: Diagnosis present

## 2023-11-20 DIAGNOSIS — E039 Hypothyroidism, unspecified: Secondary | ICD-10-CM | POA: Diagnosis present

## 2023-11-20 DIAGNOSIS — Z91030 Bee allergy status: Secondary | ICD-10-CM

## 2023-11-20 DIAGNOSIS — I5042 Chronic combined systolic (congestive) and diastolic (congestive) heart failure: Secondary | ICD-10-CM | POA: Diagnosis present

## 2023-11-20 DIAGNOSIS — I959 Hypotension, unspecified: Secondary | ICD-10-CM | POA: Diagnosis present

## 2023-11-20 DIAGNOSIS — D472 Monoclonal gammopathy: Secondary | ICD-10-CM | POA: Diagnosis present

## 2023-11-20 DIAGNOSIS — J4489 Other specified chronic obstructive pulmonary disease: Secondary | ICD-10-CM | POA: Diagnosis present

## 2023-11-20 DIAGNOSIS — Z880 Allergy status to penicillin: Secondary | ICD-10-CM

## 2023-11-20 DIAGNOSIS — Z87891 Personal history of nicotine dependence: Secondary | ICD-10-CM

## 2023-11-20 DIAGNOSIS — D649 Anemia, unspecified: Secondary | ICD-10-CM | POA: Diagnosis present

## 2023-11-20 DIAGNOSIS — R54 Age-related physical debility: Secondary | ICD-10-CM | POA: Diagnosis present

## 2023-11-20 HISTORY — DX: Disorder of kidney and ureter, unspecified: N28.9

## 2023-11-20 LAB — COMPREHENSIVE METABOLIC PANEL WITH GFR
ALT: 41 U/L (ref 0–44)
AST: 50 U/L — ABNORMAL HIGH (ref 15–41)
Albumin: 3.3 g/dL — ABNORMAL LOW (ref 3.5–5.0)
Alkaline Phosphatase: 80 U/L (ref 38–126)
Anion gap: 19 — ABNORMAL HIGH (ref 5–15)
BUN: 55 mg/dL — ABNORMAL HIGH (ref 8–23)
CO2: 21 mmol/L — ABNORMAL LOW (ref 22–32)
Calcium: 9.3 mg/dL (ref 8.9–10.3)
Chloride: 95 mmol/L — ABNORMAL LOW (ref 98–111)
Creatinine, Ser: 4.93 mg/dL — ABNORMAL HIGH (ref 0.44–1.00)
GFR, Estimated: 8 mL/min — ABNORMAL LOW (ref >60)
Glucose, Bld: 106 mg/dL — ABNORMAL HIGH (ref 70–99)
Potassium: 3.1 mmol/L — ABNORMAL LOW (ref 3.5–5.1)
Sodium: 135 mmol/L (ref 135–145)
Total Bilirubin: 1.2 mg/dL (ref 0.0–1.2)
Total Protein: 7.4 g/dL (ref 6.5–8.1)

## 2023-11-20 LAB — CBC
HCT: 39.7 % (ref 36.0–46.0)
Hemoglobin: 13.8 g/dL (ref 12.0–15.0)
MCH: 26.8 pg (ref 26.0–34.0)
MCHC: 34.8 g/dL (ref 30.0–36.0)
MCV: 77.1 fL — ABNORMAL LOW (ref 80.0–100.0)
Platelets: 159 K/uL (ref 150–400)
RBC: 5.15 MIL/uL — ABNORMAL HIGH (ref 3.87–5.11)
RDW: 13.9 % (ref 11.5–15.5)
WBC: 6.5 K/uL (ref 4.0–10.5)
nRBC: 0 % (ref 0.0–0.2)

## 2023-11-20 LAB — URIC ACID: Uric Acid, Serum: 6.9 mg/dL (ref 2.5–7.1)

## 2023-11-20 LAB — CK: Total CK: 106 U/L (ref 38–234)

## 2023-11-20 MED ORDER — ACETAMINOPHEN 325 MG PO TABS: 650.0000 mg | ORAL_TABLET | Freq: Four times a day (QID) | ORAL | Status: DC | PRN

## 2023-11-20 MED ORDER — LACTATED RINGERS IV BOLUS
1000.0000 mL | Freq: Once | INTRAVENOUS | Status: AC
  Administered 2023-11-20: 1000 mL via INTRAVENOUS

## 2023-11-20 MED ORDER — ONDANSETRON HCL 4 MG/2ML IJ SOLN: 4.0000 mg | Freq: Four times a day (QID) | INTRAMUSCULAR | Status: DC | PRN

## 2023-11-20 MED ORDER — LEVOTHYROXINE SODIUM 50 MCG PO TABS
50.0000 ug | ORAL_TABLET | Freq: Every day | ORAL | Status: DC
  Administered 2023-11-21 – 2023-11-24 (×4): 50 ug via ORAL
  Filled 2023-11-20 (×4): qty 1

## 2023-11-20 MED ORDER — POTASSIUM CHLORIDE CRYS ER 20 MEQ PO TBCR
20.0000 meq | EXTENDED_RELEASE_TABLET | Freq: Once | ORAL | Status: AC
  Administered 2023-11-20: 20 meq via ORAL
  Filled 2023-11-20: qty 1

## 2023-11-20 MED ORDER — MIRTAZAPINE 15 MG PO TBDP
15.0000 mg | ORAL_TABLET | Freq: Every day | ORAL | Status: DC
  Administered 2023-11-21 – 2023-11-23 (×3): 15 mg via ORAL
  Filled 2023-11-20 (×7): qty 1

## 2023-11-20 MED ORDER — ACETAMINOPHEN 650 MG RE SUPP: 650.0000 mg | Freq: Four times a day (QID) | RECTAL | Status: DC | PRN

## 2023-11-20 MED ORDER — ONDANSETRON HCL 4 MG PO TABS: 4.0000 mg | ORAL_TABLET | Freq: Four times a day (QID) | ORAL | Status: DC | PRN

## 2023-11-20 NOTE — ED Provider Notes (Signed)
 [place] EMERGENCY DEPARTMENT AT [facility] Provider Note   CSN: [id number] Arrival date & time: 11/20/23  1232     Patient presents with: Abnormal Lab and Dizziness   Andrea Lowery is a 83 y.o. female.    Abnormal Lab Dizziness Patient presents with laboratory abnormality.  Recent admission to the hospital for acute kidney injury.  Now returns.  Had outpatient labs drawn yesterday that showed creatinine back up to 4.5.  Baseline creatinine is normal but up to over 4 with recent admission went down to 2.2 at discharge.  Recheck today had increased.  Patient states she has not had much oral intake.  No fevers.      Past Medical History:  Diagnosis Date   Arthritis    Asthma    Asthma 01/10/2016   Last Assessment & Plan:  After recent ED eval- currrently utilizing prednisone  as prescribed. Begin Levaquin , prednisone  and Albuterol  MDI to be utilized as needed for acute wheezing FU in 2 day is no improvement or worsening of symptoms. Pt verbalizes understanding for current treatment plan. Follow-up in one week. Plan to increase chronic inhaled steroid dose to assess further with prevention of   Diabetes mellitus without complication (HCC)    Diabetes II   Difficulty sleeping 12/28/2013   Last Assessment & Plan:  Stable with trazodone  nightly. Continue current treatment plan. Refill trazodone  today. Tolerating medication well without side effects. Return in 3 months for follow up or sooner if any worsening of symptoms.    DOE (dyspnea on exertion) 07/01/2012   DVT (deep venous thrombosis) (HCC)    Years ago   Dyslipidemia    Eczema    GERD (gastroesophageal reflux disease)    Headache    Migraine   Hypertension    Hypokalemia 04/30/2016   Hypothyroidism    LBBB (left bundle branch block)    MGUS (monoclonal gammopathy of unknown significance)    MGUS (monoclonal gammopathy of unknown significance)    Migraine    OSA on CPAP    Renal disorder    Sinus  infection    had sinus surgery   Urinary frequency 07/25/2021    Prior to Admission medications   Medication Sig Start Date End Date Taking? Authorizing Provider  albuterol  (VENTOLIN  HFA) 108 (90 Base) MCG/ACT inhaler INHALE 2 PUFFS EVERY 4 HOURS AS NEEDED FOR WHEEZING OR SHORTNESS OF BREATH 08/05/22   Ambs, Arlean HERO, FNP  allopurinol (ZYLOPRIM) 100 MG tablet Take 1 tablet (100 mg total) by mouth daily. 11/13/23   Dino Antu, MD  aspirin  EC 81 MG tablet Take 1 tablet (81 mg total) by mouth daily. Swallow whole. 10/01/23   Pokhrel, Vernal, MD  Benralizumab  (FASENRA  PEN) 30 MG/ML SOAJ Inject 1 mL (30 mg total) into the skin every 8 (eight) weeks. Patient not taking: Reported on 11/12/2023 07/04/22   Cari Arlean HERO, FNP  budesonide -formoterol  (SYMBICORT ) 160-4.5 MCG/ACT inhaler INHALE 2 PUFFS BY MOUTH INTO THE LUNGS TWICE DAILY RINSE GARGLE AND SPIT AFTER USE Patient taking differently: Inhale 2 puffs into the lungs daily. INHALE 2 PUFFS BY MOUTH INTO THE LUNGS ONCE DAILY RINSE GARGLE AND SPIT AFTER USE 11/26/22   Kozlow, Camellia PARAS, MD  EPINEPHrine  0.3 mg/0.3 mL IJ SOAJ injection Inject 0.3 mg into the muscle as needed for anaphylaxis. Use as directed for severe allergic reaction 05/27/23   Kozlow, Camellia PARAS, MD  esomeprazole  (NEXIUM ) 40 MG capsule Take 1 capsule (40 mg total) by mouth 2 (two) times daily.  Patient not taking: Reported on 11/12/2023 05/27/23   Kozlow, Eric J, MD  ezetimibe  (ZETIA ) 10 MG tablet TAKE 1 TABLET EVERY DAY 11/11/22   Karna Fellows, MD  Fluocinolone  Acetonide Scalp (DERMA-SMOOTHE JANA SCALP) 0.01 % OIL Apply 1-7 times a week 05/28/23   Kozlow, Eric J, MD  fluticasone  (FLONASE ) 50 MCG/ACT nasal spray Place 2 sprays into both nostrils daily. 05/27/23   Kozlow, Camellia PARAS, MD  levothyroxine  (SYNTHROID ) 50 MCG tablet TAKE 1 TABLET EVERY DAY, AT LEAST 30-60 MINUTES BEFORE OTHER FOOD OR MEDICINE. 08/12/22   Karna Fellows, MD  montelukast  (SINGULAIR ) 10 MG tablet Take 1 tablet (10 mg total) by mouth at bedtime.  05/27/23   Kozlow, Eric J, MD  Polyethyl Glycol-Propyl Glycol (SYSTANE) 0.4-0.3 % GEL ophthalmic gel Place 1 Application into both eyes as needed (Dry eyes).    [provider]  rosuvastatin  (CRESTOR ) 40 MG tablet TAKE 1 TABLET EVERY DAY (NEED MD APPOINTMENT FOR REFILLS) 11/01/22   Karna Fellows, MD    Allergies: Bee venom and Penicillins    Review of Systems  Neurological:  Positive for dizziness.    Updated Vital Signs BP (!) 91/49   Pulse (!) 46   Temp (!) 97.5 F (36.4 C) (Oral)   Resp 13   Ht 5' 8 (1.727 m)   Wt 70.8 kg   SpO2 100%   BMI 23.72 kg/m   Physical Exam Vitals and nursing note reviewed.  Cardiovascular:     Rate and Rhythm: Bradycardia present.  Pulmonary:     Breath sounds: No wheezing.  Abdominal:     Tenderness: There is no abdominal tenderness.  Skin:    General: Skin is warm.     Capillary Refill: Capillary refill takes less than 2 seconds.  Neurological:     Mental Status: She is alert and oriented to person, place, and time.     (all labs ordered are listed, but only abnormal results are displayed) Labs Reviewed  COMPREHENSIVE METABOLIC PANEL WITH GFR - Abnormal; Notable for the following components:      Result Value   Potassium 3.1 (*)    Chloride 95 (*)    CO2 21 (*)    Glucose, Bld 106 (*)    BUN 55 (*)    Creatinine, Ser 4.93 (*)    Albumin 3.3 (*)    AST 50 (*)    GFR, Estimated 8 (*)    Anion gap 19 (*)    All other components within normal limits  CBC - Abnormal; Notable for the following components:   RBC 5.15 (*)    MCV 77.1 (*)    All other components within normal limits  URINALYSIS, ROUTINE W REFLEX MICROSCOPIC    EKG: EKG Interpretation Date/Time:  Thursday November 20 2023 12:43:00 EDT Ventricular Rate:  53 PR Interval:  198 QRS Duration:  156 QT Interval:  483 QTC Calculation: 454 R Axis:   -76  Text Interpretation: Sinus rhythm Left bundle branch block No significant change since last tracing Confirmed  by Patsey Lot [phone]) on 11/20/2023 1:45:52 PM  Radiology: No results found.   Procedures   Medications Ordered in the ED  lactated ringers  bolus 1,000 mL (1,000 mLs Intravenous New Bag/Given 11/20/23 1304)                                    Medical Decision Making Amount and/or Complexity of Data Reviewed Labs:  ordered.   Patient with worsening kidney function.  Acute kidney injury.  Recent admission for the same.  Thought to be potentially due to decreased intake plus medicine effect.  With return of laboratory abnormalities will admit.  Reviewed previous discharge note.  TSH had been normal.  Has had hypotension.  Repeat fluid bolus given.  CRITICAL CARE Performed by: [place] Total critical care time: 30 minutes Critical care time was exclusive of separately billable procedures and treating other patients. Critical care was necessary to treat or prevent imminent or life-threatening deterioration. Critical care was time spent personally by me on the following activities: development of treatment plan with patient and/or surrogate as well as nursing, discussions with consultants, evaluation of patient's response to treatment, examination of patient, obtaining history from patient or surrogate, ordering and performing treatments and interventions, ordering and review of laboratory studies, ordering and review of radiographic studies, pulse oximetry and re-evaluation of patient's condition.      Final diagnoses:  None    ED Discharge Orders     None          River Rankin, MD 11/20/23 [phone]

## 2023-11-20 NOTE — ED Provider Notes (Signed)
 Pt seen by Dr Patsey.  Please see his note.  Plan was for admission to  hospital for recurrent AKI, dehydration.  I did clarify with the patient that patient has been up and walking over the last couple days.  She was actually able to go to her doctor's appointment yesterday and that is where she had laboratory test.    Case discussed with hospitalist service regarding admission   Randol Simmonds, MD 11/21/23 2330

## 2023-11-20 NOTE — ED Triage Notes (Addendum)
 Per EMS, Pt presents d/t abnormal renal labs.  Pt was seen around 1 week ago for same.  EMS reports Pt fell x1 week ago and she laid on the ground x3 days.  Pt c/o back/buttock pain and intermittent dizziness since fall.  Pain score 7/10.    Pt had labs drawn yesterday:  BUN 54 Creatinine 4.5

## 2023-11-20 NOTE — H&P (Signed)
 History and Physical    Andrea Lowery FMW:[id number] DOB: Jun 11, 1940 DOA: 11/20/2023  I have briefly reviewed the patient's prior medical records in [facility]  PCP: Patient, No Pcp Per  Patient coming from: Home  Chief Complaint: Dizziness and abnormal labs at PCPs office  HPI: Andrea Lowery is a 83 y.o. female with medical history significant of hypertension, MGUS, hypothyroidism, asthma, GERD and recent hospitalization for acute kidney injury from 10/7 to 10/9.  Patient states she has not had good oral intake since discharge from the hospital.  Patient complaining of low back/buttock pain and thinks she may have a bruise from her prior fall (unclear if patient fell again after discharge).  Patient is also had intermittent dizziness since her last admission.  Patient was getting routine labs done yesterday and the results show a creatinine of 4.5 (baseline is less than 1).  Patient thinks she has been weak and was unable to get out of her chair for several days.  Per patient her son gives her her medications and he is not available.  In the ER she was given IV fluids and labs were obtained that confirmed a creatinine of greater than 4.  Hospitalist were asked to place in observation for acute kidney injury    Review of Systems: As per HPI otherwise 10 point review of systems negative.   Past Medical History:  Diagnosis Date   Arthritis    Asthma    Asthma 01/10/2016   Last Assessment & Plan:  After recent ED eval- currrently utilizing prednisone  as prescribed. Begin Levaquin , prednisone  and Albuterol  MDI to be utilized as needed for acute wheezing FU in 2 day is no improvement or worsening of symptoms. Pt verbalizes understanding for current treatment plan. Follow-up in one week. Plan to increase chronic inhaled steroid dose to assess further with prevention of   Diabetes mellitus without complication (HCC)    Diabetes II   Difficulty sleeping 12/28/2013   Last  Assessment & Plan:  Stable with trazodone  nightly. Continue current treatment plan. Refill trazodone  today. Tolerating medication well without side effects. Return in 3 months for follow up or sooner if any worsening of symptoms.    DOE (dyspnea on exertion) 07/01/2012   DVT (deep venous thrombosis) (HCC)    Years ago   Dyslipidemia    Eczema    GERD (gastroesophageal reflux disease)    Headache    Migraine   Hypertension    Hypokalemia 04/30/2016   Hypothyroidism    LBBB (left bundle branch block)    MGUS (monoclonal gammopathy of unknown significance)    MGUS (monoclonal gammopathy of unknown significance)    Migraine    OSA on CPAP    Renal disorder    Sinus infection    had sinus surgery   Urinary frequency 07/25/2021    Past Surgical History:  Procedure Laterality Date   ABDOMINAL HYSTERECTOMY  1969   ARTERY BIOPSY N/A 11/07/2014   Procedure: BIOPSY TEMPORAL ARTERY;  Surgeon: Daniel Moccasin, MD;  Location: [facility] OR;  Service: ENT;  Laterality: N/A;   CARPAL TUNNEL RELEASE Right    CHOLECYSTECTOMY  1976   ENDOVENOUS ABLATION SAPHENOUS VEIN W/ LASER Left 08/24/2020   endovenous laser ablation left greater saphenous vein by Medford Blade MD   LUMBAR LAMINECTOMY/DECOMPRESSION MICRODISCECTOMY N/A 07/31/2015   Procedure: L4-5 Decompression, Possible Right L4-5 Microdiscectomy;  Surgeon: Oneil JAYSON Herald, MD;  Location: [facility] OR;  Service: Orthopedics;  Laterality: N/A;   NASAL SINUS SURGERY  years ago   NM MYOVIEW  LTD  02/13/2010   No ischemia   PLANTAR FASCIA RELEASE     TONSILLECTOMY  1965   US  ECHOCARDIOGRAPHY  09/20/2008   borderline LVH,mild TR,AOV mildly sclerotic w/ca+ of the leaflets     reports that she quit smoking about 17 years ago. Her smoking use included cigarettes. She has never used smokeless tobacco. She reports that she does not drink alcohol  and does not use drugs.  Allergies  Allergen Reactions   Bee Venom Anaphylaxis    Yellow jackets   Penicillins  Anaphylaxis and Hives    Family History  Problem Relation Age of Onset   Dementia Mother    Gallbladder disease Mother    Diabetes Father    Heart failure Father    Hypertension Father    Sleep apnea Father    Kidney disease Father    Dementia Father    Thyroid  disease Sister    Hypertension Sister    Dementia Sister    Healthy Sister     Prior to Admission medications   Medication Sig Start Date End Date Taking? Authorizing Provider  albuterol  (VENTOLIN  HFA) 108 (90 Base) MCG/ACT inhaler INHALE 2 PUFFS EVERY 4 HOURS AS NEEDED FOR WHEEZING OR SHORTNESS OF BREATH 08/05/22   Ambs, Arlean HERO, FNP  allopurinol (ZYLOPRIM) 100 MG tablet Take 1 tablet (100 mg total) by mouth daily. 11/13/23   Rashid, Farhan, MD  aspirin  EC 81 MG tablet Take 1 tablet (81 mg total) by mouth daily. Swallow whole. 10/01/23   Pokhrel, Vernal, MD  Benralizumab  (FASENRA  PEN) 30 MG/ML SOAJ Inject 1 mL (30 mg total) into the skin every 8 (eight) weeks. Patient not taking: Reported on 11/12/2023 07/04/22   Cari Arlean HERO, FNP  budesonide -formoterol  (SYMBICORT ) 160-4.5 MCG/ACT inhaler INHALE 2 PUFFS BY MOUTH INTO THE LUNGS TWICE DAILY RINSE GARGLE AND SPIT AFTER USE Patient taking differently: Inhale 2 puffs into the lungs daily. INHALE 2 PUFFS BY MOUTH INTO THE LUNGS ONCE DAILY RINSE GARGLE AND SPIT AFTER USE 11/26/22   Kozlow, Camellia PARAS, MD  EPINEPHrine  0.3 mg/0.3 mL IJ SOAJ injection Inject 0.3 mg into the muscle as needed for anaphylaxis. Use as directed for severe allergic reaction 05/27/23   Kozlow, Camellia PARAS, MD  esomeprazole  (NEXIUM ) 40 MG capsule Take 1 capsule (40 mg total) by mouth 2 (two) times daily. Patient not taking: Reported on 11/12/2023 05/27/23   Kozlow, Eric J, MD  ezetimibe  (ZETIA ) 10 MG tablet TAKE 1 TABLET EVERY DAY 11/11/22   Karna Fellows, MD  Fluocinolone  Acetonide Scalp (DERMA-SMOOTHE JANA SCALP) 0.01 % OIL Apply 1-7 times a week 05/28/23   Kozlow, Eric J, MD  fluticasone  (FLONASE ) 50 MCG/ACT nasal spray Place 2  sprays into both nostrils daily. 05/27/23   Kozlow, Camellia PARAS, MD  levothyroxine  (SYNTHROID ) 50 MCG tablet TAKE 1 TABLET EVERY DAY, AT LEAST 30-60 MINUTES BEFORE OTHER FOOD OR MEDICINE. 08/12/22   Karna Fellows, MD  montelukast  (SINGULAIR ) 10 MG tablet Take 1 tablet (10 mg total) by mouth at bedtime. 05/27/23   Kozlow, Eric J, MD  Polyethyl Glycol-Propyl Glycol (SYSTANE) 0.4-0.3 % GEL ophthalmic gel Place 1 Application into both eyes as needed (Dry eyes).    [provider]  rosuvastatin  (CRESTOR ) 40 MG tablet TAKE 1 TABLET EVERY DAY (NEED MD APPOINTMENT FOR REFILLS) 11/01/22   Karna Fellows, MD    Physical Exam: Vitals:   11/20/23 1515 11/20/23 1530 11/20/23 1545 11/20/23 1600  BP: (!) 98/50 (!) 98/54 ROLLEN)  86/51 (!) 111/43  Pulse: (!) 43 (!) 45 (!) 45 (!) 41  Resp: 13  16 15   Temp:      TempSrc:      SpO2: 100% 100% 100% 100%  Weight:      Height:          Constitutional: NAD, calm, comfortable Eyes: PERRL, lids and conjunctivae normal Neck: normal, supple, no masses, no thyromegaly Respiratory: clear to auscultation bilaterally, no wheezing, no crackles. Normal respiratory effort. No accessory muscle use.  Cardiovascular: Regular rate and rhythm, no murmurs / rubs / gallops.  Positive lower extremity edema (patient says improved from prior) Abdomen: no tenderness, no masses palpated. Bowel sounds positive.  Musculoskeletal: no clubbing / cyanosis. Normal muscle tone.  Skin: no rashes, lesions, ulcers. No induration Neurologic: CN 2-12 grossly intact. Strength 5/5 in all 4.  Psychiatric: Normal judgment and insight. Alert and oriented x 3. Normal mood.-Probably has some memory impairment  Labs on Admission: I have personally reviewed following labs and imaging studies  CBC: Recent Labs  Lab 11/20/23 1258  WBC 6.5  HGB 13.8  HCT 39.7  MCV 77.1*  PLT 159   Basic Metabolic Panel: Recent Labs  Lab 11/20/23 1258  NA 135  K 3.1*  CL 95*  CO2 21*  GLUCOSE 106*  BUN 55*   CREATININE 4.93*  CALCIUM  9.3   GFR: Estimated Creatinine Clearance: 9 mL/min (A) (by C-G formula based on SCr of 4.93 mg/dL (H)). Liver Function Tests: Recent Labs  Lab 11/20/23 1258  AST 50*  ALT 41  ALKPHOS 80  BILITOT 1.2  PROT 7.4  ALBUMIN 3.3*   No results for input(s): LIPASE, AMYLASE in the last 168 hours. No results for input(s): AMMONIA in the last 168 hours. Coagulation Profile: No results for input(s): INR, PROTIME in the last 168 hours. Cardiac Enzymes: No results for input(s): CKTOTAL, CKMB, CKMBINDEX, TROPONINI in the last 168 hours. BNP (last 3 results) Recent Labs    09/30/23 0025  PROBNP 73.8   HbA1C: No results for input(s): HGBA1C in the last 72 hours. CBG: No results for input(s): GLUCAP in the last 168 hours. Lipid Profile: No results for input(s): CHOL, HDL, LDLCALC, TRIG, CHOLHDL, LDLDIRECT in the last 72 hours. Thyroid  Function Tests: No results for input(s): TSH, T4TOTAL, FREET4, T3FREE, THYROIDAB in the last 72 hours. Anemia Panel: No results for input(s): VITAMINB12, FOLATE, FERRITIN, TIBC, IRON, RETICCTPCT in the last 72 hours. Urine analysis:    Component Value Date/Time   COLORURINE YELLOW 11/11/2023 1953   APPEARANCEUR CLEAR 11/11/2023 1953   LABSPEC 1.009 11/11/2023 1953   PHURINE 5.0 11/11/2023 1953   GLUCOSEU >=500 (A) 11/11/2023 1953   HGBUR NEGATIVE 11/11/2023 1953   BILIRUBINUR NEGATIVE 11/11/2023 1953   BILIRUBINUR negative 01/20/2023 1828   BILIRUBINUR Negative 07/24/2021 1419   KETONESUR NEGATIVE 11/11/2023 1953   PROTEINUR NEGATIVE 11/11/2023 1953   UROBILINOGEN 0.2 01/20/2023 1828   UROBILINOGEN 0.2 09/05/2013 1543   NITRITE NEGATIVE 11/11/2023 1953   LEUKOCYTESUR NEGATIVE 11/11/2023 1953     Radiological Exams on Admission: No results found.  EKG: Independently reviewed.  Sinus bradycardia with left bundle branch block  Assessment/Plan Principal  Problem:   AKI (acute kidney injury)    AKI - Patient was recently hospitalized from 10/7 to 10/9 with acute kidney injury.  Patient was discharged on 10/9 her creatinine was still greater than 2.  Patient's baseline creatinine is less than 1 - Patient states that she has had poor p.o.  intake since discharge - IV fluids - Labs in the a.m. -Urinalysis pending -Will not repeat renal ultrasound as it is normal less than a week ago - CK pending - Check uric acid as it was checked on 10/9 and was 10  -PVR - May need renal consult if not improved  Dizziness - PT eval - Once creatinine improved if patient remains dizzy we will need CT scan of head  Hypokalemia - Replete - Check labs in a.m.  Hypothyroidism - resume Synthroid    Hyperlipidemia Hold Lipitor  Failure to thrive - Start Remeron at night  History of hypertension - Now hypotensive - Hold blood pressure medications  COPD/asthma Resume meds as needed  History of MUGA's - Check SPEP/UPEP -Follows with Dr. Lazaro such  DVT prophylaxis: SCDs Code Status: Full Family Communication: Cousin at bedside Disposition Plan: Pending PT evaluation Consults called: None     Harlene RAYMOND Bowl [facility]   How to contact the [facility] Attending or Consulting provider 7A - 7P or covering provider during after hours 7P -7A, for this patient?  Check the care team in [facility] and look for a) attending/consulting [facility] provider listed and b) the TR[facility] team listed Log into [email or web address] and use [place]'s universal password to access. If you do not have the password, please contact the hospital operator. Locate the [facility] provider you are looking for under [facility] and page to a number that you can be directly reached. If you still have difficulty reaching the provider, please page the [facility] (Director on Call) for the Hospitalists listed on amion for assistance.  11/20/2023, 5:08 PM

## 2023-11-21 ENCOUNTER — Other Ambulatory Visit: Payer: Self-pay

## 2023-11-21 DIAGNOSIS — K219 Gastro-esophageal reflux disease without esophagitis: Secondary | ICD-10-CM | POA: Diagnosis present

## 2023-11-21 DIAGNOSIS — I5042 Chronic combined systolic (congestive) and diastolic (congestive) heart failure: Secondary | ICD-10-CM | POA: Diagnosis present

## 2023-11-21 DIAGNOSIS — D649 Anemia, unspecified: Secondary | ICD-10-CM | POA: Diagnosis present

## 2023-11-21 DIAGNOSIS — Z515 Encounter for palliative care: Secondary | ICD-10-CM | POA: Diagnosis not present

## 2023-11-21 DIAGNOSIS — E039 Hypothyroidism, unspecified: Secondary | ICD-10-CM | POA: Diagnosis present

## 2023-11-21 DIAGNOSIS — E876 Hypokalemia: Secondary | ICD-10-CM | POA: Diagnosis present

## 2023-11-21 DIAGNOSIS — E861 Hypovolemia: Secondary | ICD-10-CM | POA: Diagnosis present

## 2023-11-21 DIAGNOSIS — Z7984 Long term (current) use of oral hypoglycemic drugs: Secondary | ICD-10-CM | POA: Diagnosis not present

## 2023-11-21 DIAGNOSIS — D696 Thrombocytopenia, unspecified: Secondary | ICD-10-CM | POA: Diagnosis present

## 2023-11-21 DIAGNOSIS — E119 Type 2 diabetes mellitus without complications: Secondary | ICD-10-CM | POA: Diagnosis present

## 2023-11-21 DIAGNOSIS — I959 Hypotension, unspecified: Secondary | ICD-10-CM | POA: Diagnosis present

## 2023-11-21 DIAGNOSIS — I110 Hypertensive heart disease with heart failure: Secondary | ICD-10-CM | POA: Diagnosis present

## 2023-11-21 DIAGNOSIS — J4489 Other specified chronic obstructive pulmonary disease: Secondary | ICD-10-CM | POA: Diagnosis present

## 2023-11-21 DIAGNOSIS — E785 Hyperlipidemia, unspecified: Secondary | ICD-10-CM | POA: Diagnosis present

## 2023-11-21 DIAGNOSIS — R001 Bradycardia, unspecified: Secondary | ICD-10-CM | POA: Diagnosis present

## 2023-11-21 DIAGNOSIS — Z7951 Long term (current) use of inhaled steroids: Secondary | ICD-10-CM | POA: Diagnosis not present

## 2023-11-21 DIAGNOSIS — E875 Hyperkalemia: Secondary | ICD-10-CM | POA: Diagnosis present

## 2023-11-21 DIAGNOSIS — Z79890 Hormone replacement therapy (postmenopausal): Secondary | ICD-10-CM | POA: Diagnosis not present

## 2023-11-21 DIAGNOSIS — Z79899 Other long term (current) drug therapy: Secondary | ICD-10-CM | POA: Diagnosis not present

## 2023-11-21 DIAGNOSIS — R627 Adult failure to thrive: Secondary | ICD-10-CM | POA: Diagnosis present

## 2023-11-21 DIAGNOSIS — N179 Acute kidney failure, unspecified: Secondary | ICD-10-CM | POA: Diagnosis present

## 2023-11-21 DIAGNOSIS — D472 Monoclonal gammopathy: Secondary | ICD-10-CM | POA: Diagnosis present

## 2023-11-21 LAB — URINALYSIS, ROUTINE W REFLEX MICROSCOPIC
Bilirubin Urine: NEGATIVE
Glucose, UA: >=500 mg/dL — AB
Ketones, ur: NEGATIVE mg/dL
Leukocytes,Ua: NEGATIVE
Nitrite: NEGATIVE
Protein, ur: 30 mg/dL — AB
Specific Gravity, Urine: 1.006 (ref 1.005–1.030)
pH: 6 (ref 5.0–8.0)

## 2023-11-21 LAB — GLUCOSE, CAPILLARY
Glucose-Capillary: 145 mg/dL — ABNORMAL HIGH (ref 70–99)
Glucose-Capillary: 153 mg/dL — ABNORMAL HIGH (ref 70–99)
Glucose-Capillary: 140 mg/dL — ABNORMAL HIGH (ref 70–99)
Glucose-Capillary: 141 mg/dL — ABNORMAL HIGH (ref 70–99)

## 2023-11-21 LAB — COMPREHENSIVE METABOLIC PANEL WITH GFR
ALT: 33 U/L (ref 0–44)
AST: 40 U/L (ref 15–41)
Albumin: 2.7 g/dL — ABNORMAL LOW (ref 3.5–5.0)
Alkaline Phosphatase: 68 U/L (ref 38–126)
Anion gap: 13 (ref 5–15)
BUN: 45 mg/dL — ABNORMAL HIGH (ref 8–23)
CO2: 22 mmol/L (ref 22–32)
Calcium: 8.6 mg/dL — ABNORMAL LOW (ref 8.9–10.3)
Chloride: 101 mmol/L (ref 98–111)
Creatinine, Ser: 3.96 mg/dL — ABNORMAL HIGH (ref 0.44–1.00)
GFR, Estimated: 11 mL/min — ABNORMAL LOW (ref >60)
Glucose, Bld: 86 mg/dL (ref 70–99)
Potassium: 3.4 mmol/L — ABNORMAL LOW (ref 3.5–5.1)
Sodium: 136 mmol/L (ref 135–145)
Total Bilirubin: 0.8 mg/dL (ref 0.0–1.2)
Total Protein: 6 g/dL — ABNORMAL LOW (ref 6.5–8.1)

## 2023-11-21 LAB — CBC
HCT: 33.4 % — ABNORMAL LOW (ref 36.0–46.0)
HCT: 34.8 % — ABNORMAL LOW (ref 36.0–46.0)
Hemoglobin: 12 g/dL (ref 12.0–15.0)
Hemoglobin: 11.9 g/dL — ABNORMAL LOW (ref 12.0–15.0)
MCH: 27.6 pg (ref 26.0–34.0)
MCH: 27.7 pg (ref 26.0–34.0)
MCHC: 35.9 g/dL (ref 30.0–36.0)
MCHC: 34.2 g/dL (ref 30.0–36.0)
MCV: 76.8 fL — ABNORMAL LOW (ref 80.0–100.0)
MCV: 80.9 fL (ref 80.0–100.0)
Platelets: 143 K/uL — ABNORMAL LOW (ref 150–400)
Platelets: 133 K/uL — ABNORMAL LOW (ref 150–400)
RBC: 4.35 MIL/uL (ref 3.87–5.11)
RBC: 4.3 MIL/uL (ref 3.87–5.11)
RDW: 13.8 % (ref 11.5–15.5)
RDW: 14 % (ref 11.5–15.5)
WBC: 5.9 K/uL (ref 4.0–10.5)
WBC: 5.1 K/uL (ref 4.0–10.5)
nRBC: 0 % (ref 0.0–0.2)
nRBC: 0 % (ref 0.0–0.2)

## 2023-11-21 LAB — CREATININE, SERUM
Creatinine, Ser: 3.64 mg/dL — ABNORMAL HIGH (ref 0.44–1.00)
GFR, Estimated: 12 mL/min — ABNORMAL LOW (ref >60)

## 2023-11-21 LAB — CK: Total CK: 86 U/L (ref 38–234)

## 2023-11-21 MED ORDER — HEPARIN SODIUM (PORCINE) 5000 UNIT/ML IJ SOLN
5000.0000 [IU] | Freq: Three times a day (TID) | INTRAMUSCULAR | Status: DC
  Administered 2023-11-21 – 2023-11-24 (×8): 5000 [IU] via SUBCUTANEOUS
  Filled 2023-11-21 (×8): qty 1

## 2023-11-21 MED ORDER — BUPIVACAINE HCL (PF) 0.25 % IJ SOLN
INTRAMUSCULAR | Status: AC
  Filled 2023-11-21: qty 30

## 2023-11-21 MED ORDER — LACTATED RINGERS IV SOLN: INTRAVENOUS | Status: AC

## 2023-11-21 NOTE — NC FL2 (Signed)
 [place]  MEDICAID FL2 LEVEL OF CARE FORM     IDENTIFICATION  Patient Name: Andrea Lowery Birthdate: 1940-05-21 Sex: female Admission Date (Current Location): 11/20/2023  [facility] and [place] Number:  Producer, television/film/video and Address:  [facility] [place], [address] [place], [place] [address]      Provider Number: [id number]  Attending Physician Name and Address:  Dana Ivan Masse*  Relative Name and Phone Number:  Pullman,Christopher (Son)  [phone]    Current Level of Care: Hospital Recommended Level of Care: Skilled Nursing Facility Prior Approval Number:    Date Approved/Denied:   PASRR Number: [id number]  Discharge Plan: SNF    Current Diagnoses: Patient Active Problem List   Diagnosis Date Noted   High anion gap metabolic acidosis 11/12/2023   Generalized weakness 11/12/2023   Acute hyponatremia 11/12/2023   DM2 (diabetes mellitus, type 2) (HCC) 11/12/2023   History of essential hypertension 11/12/2023   AKI (acute kidney injury) 11/11/2023   Asthma, chronic, unspecified asthma severity, with acute exacerbation 09/30/2023   Encounter for screening for malignant neoplasm of colon 09/29/2023   Right shoulder tendinitis 06/17/2022   Seasonal allergic conjunctivitis 04/22/2022   Moderate persistent asthma 11/05/2021   Trigeminal neuralgia of left side of face 11/05/2021   Arthritis 11/05/2021   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   Vitamin D  insufficiency 10/15/2021   De Quervain's tenosynovitis, left 09/21/2021   Seasonal and perennial allergic rhinitis 08/18/2019   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   Eczema 08/02/2016   Hypokalemia 04/30/2016   Osteoporosis without current pathological fracture 05/30/2015   Acute right hip pain 05/02/2015   Chronic right-sided low back pain with right-sided sciatica 03/31/2015   Type 2 diabetes mellitus with hyperglycemia (HCC) 11/07/2014   Chronic diastolic CHF (congestive heart  failure) (HCC) 11/07/2014   Sacroiliac dysfunction 11/04/2014   LPRD (laryngopharyngeal reflux disease) 10/05/2014   Allergic rhinitis 10/05/2014   Acquired hypothyroidism 09/21/2014   Thyroid  nodule 07/01/2014   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/18/2014   Healthcare maintenance 01/25/2014   HLD (hyperlipidemia) 12/27/2013   Bilateral leg edema 07/20/2013   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/2013   Essential hypertension 07/01/2012    Orientation RESPIRATION BLADDER Height & Weight     Self, Situation, Time, Place  Normal Continent Weight: 156 lb (70.8 kg) Height:  5' 8 (172.7 cm)  BEHAVIORAL SYMPTOMS/MOOD NEUROLOGICAL BOWEL NUTRITION STATUS      Continent Diet (see dc summary)  AMBULATORY STATUS COMMUNICATION OF NEEDS Skin   Extensive Assist (Moderate assistance per PT note)   Skin abrasions (Ecchymosis)                       Personal Care Assistance Level of Assistance  Bathing, Feeding, Dressing Bathing Assistance: Limited assistance Feeding assistance: Independent Dressing Assistance: Limited assistance     Functional Limitations Info  Sight, Hearing, Speech Sight Info: Impaired Hearing Info: Adequate Speech Info: Adequate    SPECIAL CARE FACTORS FREQUENCY  PT (By licensed PT), OT (By licensed OT)     PT Frequency: 5x/wk OT Frequency: 5x/wk            Contractures Contractures Info: Not present    Additional Factors Info  Code Status, Allergies Code Status Info: FULL Allergies Info: Bee Venom  Penicillins           Current Medications (11/21/2023):  This is the current hospital active medication list Current Facility-Administered Medications  Medication Dose Route Frequency  Provider Last Rate Last Admin   acetaminophen  (TYLENOL ) tablet 650 mg  650 mg Oral Q6H PRN Vann, Jessica U, DO       Or   acetaminophen  (TYLENOL ) suppository 650 mg  650 mg Rectal Q6H PRN Juvenal Harlene PENNER, DO       lactated ringers   infusion   Intravenous Continuous Chatterjee, Srobona Tublu, MD 125 mL/hr at 11/21/23 0942 New Bag at 11/21/23 0942   levothyroxine  (SYNTHROID ) tablet 50 mcg  50 mcg Oral Q0600 Vann, Jessica U, DO   50 mcg at 11/21/23 0532   mirtazapine (REMERON SOL-TAB) disintegrating tablet 15 mg  15 mg Oral QHS Vann, Jessica U, DO       ondansetron  (ZOFRAN ) tablet 4 mg  4 mg Oral Q6H PRN Vann, Jessica U, DO       Or   ondansetron  (ZOFRAN ) injection 4 mg  4 mg Intravenous Q6H PRN Vann, Jessica U, DO         Discharge Medications: Please see discharge summary for a list of discharge medications.  Relevant Imaging Results:  Relevant Lab Results:   Additional Information SSN: [id number]  Lendia Dais, LCSWA

## 2023-11-21 NOTE — Consult Note (Signed)
 Reason for Consult: AKI Referring Physician: Dana, MD  Andrea Lowery is an 83 y.o. female has a PMH significant for DM type 2, HTN, HLD, hypothyroidism, MGUS, COPD, chronic combined systolic and diastolic CHF, and recent hospitalization 10/7-10/9/25 after a fall and found to have AKI presumably due to volume depletion.  She improved with IVF's from peak Scr of 3.76 to 2.2 at time of discharge.  She was to stop jardiance , HCTRZ, irbesartan , metoprolol , spironolactone , and amlodipine  at time of discharge.  She presented back to [facility] ED on 11/20/23 via EMS due to abnormal lab results from her PCP's office.  Per EMS, she had fallen at home a week ago and laid on the ground for 3 days.  She was still complaining of back/buttock pain.  In the ED, Temp 97.5, Bp 86/51, HR 45, SpO2 100%.  Labs were notable for K 3.1, Cl 95, Co2 21, BUN 55, Cr 4.93, gluc 106.  She was readmitted and started on IVF's.  We were consulted due to recurrent AKI.  The trend in Scr is seen below.    Unfortunately, she resumed jardiance  after her last discharge but did stop the hydrochlorothiazide  and irbesartan  (but was told to resume it on Monday).  She has had some nausea but no vomiting or diarrhea.  Her son is at the bedside and reports that she has not been eating or drinking much of anything for the past few weeks.  She does have a family history of ESRD (father and niece were on dialysis prior to their deaths)  Trend in Creatinine:  Creatinine, Ser  Date/Time Value Ref Range Status  11/21/2023 06:25 AM 3.96 (H) 0.44 - 1.00 mg/dL Final  89/83/7974 87:41 PM 4.93 (H) 0.44 - 1.00 mg/dL Final  89/90/7974 97:53 AM 2.20 (H) 0.44 - 1.00 mg/dL Final  89/91/7974 94:47 AM 2.97 (H) 0.44 - 1.00 mg/dL Final  89/92/7974 92:64 PM 3.76 (H) 0.44 - 1.00 mg/dL Final  91/72/7974 97:83 AM 0.95 0.44 - 1.00 mg/dL Final  91/73/7974 87:74 PM 0.84 0.44 - 1.00 mg/dL Final  91/74/7974 90:91 PM 0.88 0.44 - 1.00 mg/dL Final  93/82/7974 98:64  PM 0.79 0.44 - 1.00 mg/dL Final  87/72/7975 94:44 AM 0.79 0.44 - 1.00 mg/dL Final  91/86/7975 88:59 AM 0.80 0.57 - 1.00 mg/dL Final  94/86/7975 89:96 AM 0.77 0.57 - 1.00 mg/dL Final  98/70/7975 90:54 PM 0.99 0.44 - 1.00 mg/dL Final  90/79/7976 97:45 PM 0.87 0.44 - 1.00 mg/dL Final  90/93/7976 95:89 PM 0.74 0.57 - 1.00 mg/dL Final  93/79/7976 96:67 PM 0.76 0.57 - 1.00 mg/dL Final  94/74/7976 88:65 AM 0.82 0.57 - 1.00 mg/dL Final  94/88/7976 89:92 AM 0.87 0.57 - 1.00 mg/dL Final  98/90/7976 97:76 PM 0.69 0.57 - 1.00 mg/dL Final  89/80/7977 95:95 PM 0.76 0.57 - 1.00 mg/dL Final  91/90/7977 97:67 AM 1.00 0.44 - 1.00 mg/dL Final  91/91/7977 96:99 AM 0.80 0.44 - 1.00 mg/dL Final  91/92/7977 98:75 PM 0.80 0.44 - 1.00 mg/dL Final  91/92/7977 98:96 PM 0.88 0.44 - 1.00 mg/dL Final  93/86/7977 87:68 PM 1.02 (H) 0.44 - 1.00 mg/dL Final  87/70/7979 96:60 AM 0.85 0.44 - 1.00 mg/dL Final  89/80/7979 97:42 PM 0.94 0.57 - 1.00 mg/dL Final  91/82/7980 95:66 PM 0.99 0.44 - 1.00 mg/dL Final  96/78/7980 89:53 AM 0.98 0.60 - 1.10 mg/dL Final  88/75/7982 91:74 PM 0.74 0.44 - 1.00 mg/dL Final  93/79/7982 97:83 PM 0.99 0.44 - 1.00 mg/dL Final  89/85/7983 98:67 PM  1.02 0.40 - 1.20 mg/dL Final  89/95/7983 95:42 AM 0.91 0.44 - 1.00 mg/dL Final  89/96/7983 93:80 AM 0.77 0.44 - 1.00 mg/dL Final  89/97/7983 92:60 PM 0.92 0.44 - 1.00 mg/dL Final  94/74/7983 93:47 PM 0.87 0.44 - 1.00 mg/dL Final  87/72/7984 94:47 PM 0.82 0.50 - 1.10 mg/dL Final  93/96/7984 92:87 PM 0.87 0.50 - 1.10 mg/dL Final  90/69/7985 88:69 AM 0.80 0.50 - 1.10 mg/dL Final  98/91/7986 88:94 PM 0.80 0.50 - 1.10 mg/dL Final  97/74/7987 92:69 AM 0.9 0.4 - 1.2 mg/dL Final  90/77/7988 95:89 PM 1.03 0.4 - 1.2 mg/dL Final  88/76/7990 89:69 AM 0.9  Final    PMH:   Past Medical History:  Diagnosis Date   Arthritis    Asthma    Asthma 01/10/2016   Last Assessment & Plan:  After recent ED eval- currrently utilizing prednisone  as prescribed. Begin  Levaquin , prednisone  and Albuterol  MDI to be utilized as needed for acute wheezing FU in 2 day is no improvement or worsening of symptoms. Pt verbalizes understanding for current treatment plan. Follow-up in one week. Plan to increase chronic inhaled steroid dose to assess further with prevention of   Diabetes mellitus without complication (HCC)    Diabetes II   Difficulty sleeping 12/28/2013   Last Assessment & Plan:  Stable with trazodone  nightly. Continue current treatment plan. Refill trazodone  today. Tolerating medication well without side effects. Return in 3 months for follow up or sooner if any worsening of symptoms.    DOE (dyspnea on exertion) 07/01/2012   DVT (deep venous thrombosis) (HCC)    Years ago   Dyslipidemia    Eczema    GERD (gastroesophageal reflux disease)    Headache    Migraine   Hypertension    Hypokalemia 04/30/2016   Hypothyroidism    LBBB (left bundle branch block)    MGUS (monoclonal gammopathy of unknown significance)    MGUS (monoclonal gammopathy of unknown significance)    Migraine    OSA on CPAP    Renal disorder    Sinus infection    had sinus surgery   Urinary frequency 07/25/2021    PSH:   Past Surgical History:  Procedure Laterality Date   ABDOMINAL HYSTERECTOMY  1969   ARTERY BIOPSY N/A 11/07/2014   Procedure: BIOPSY TEMPORAL ARTERY;  Surgeon: Daniel Moccasin, MD;  Location: [facility] OR;  Service: ENT;  Laterality: N/A;   CARPAL TUNNEL RELEASE Right    CHOLECYSTECTOMY  1976   ENDOVENOUS ABLATION SAPHENOUS VEIN W/ LASER Left 08/24/2020   endovenous laser ablation left greater saphenous vein by Medford Blade MD   LUMBAR LAMINECTOMY/DECOMPRESSION MICRODISCECTOMY N/A 07/31/2015   Procedure: L4-5 Decompression, Possible Right L4-5 Microdiscectomy;  Surgeon: Oneil JAYSON Herald, MD;  Location: [facility] OR;  Service: Orthopedics;  Laterality: N/A;   NASAL SINUS SURGERY     years ago   NM MYOVIEW  LTD  02/13/2010   No ischemia   PLANTAR FASCIA RELEASE      TONSILLECTOMY  1965   US  ECHOCARDIOGRAPHY  09/20/2008   borderline LVH,mild TR,AOV mildly sclerotic w/ca+ of the leaflets    Allergies:  Allergies  Allergen Reactions   Bee Venom Anaphylaxis    Yellow jackets   Penicillins Anaphylaxis and Hives    Medications:   Prior to Admission medications   Medication Sig Start Date End Date Taking? Authorizing Provider  albuterol  (VENTOLIN  HFA) 108 (90 Base) MCG/ACT inhaler INHALE 2 PUFFS EVERY 4 HOURS AS NEEDED FOR WHEEZING OR SHORTNESS  OF BREATH 08/05/22  Yes Ambs, Arlean HERO, FNP  allopurinol (ZYLOPRIM) 100 MG tablet Take 1 tablet (100 mg total) by mouth daily. 11/13/23  Yes Dino Antu, MD  budesonide -formoterol  (SYMBICORT ) 160-4.5 MCG/ACT inhaler Inhale 2 puffs into the lungs 2 (two) times daily.   Yes [provider]  empagliflozin  (JARDIANCE ) 25 MG TABS tablet Take 25 mg by mouth daily.   Yes [provider]  ezetimibe  (ZETIA ) 10 MG tablet TAKE 1 TABLET EVERY DAY 11/11/22  Yes Karna Fellows, MD  fluticasone  furoate-vilanterol (BREO ELLIPTA ) 200-25 MCG/ACT AEPB Inhale 1 puff into the lungs daily.   Yes [provider]  irbesartan  (AVAPRO ) 300 MG tablet Take 300 mg by mouth daily.   Yes [provider]  metoprolol  succinate (TOPROL -XL) 50 MG 24 hr tablet Take 50 mg by mouth daily. Take with or immediately following a meal.   Yes [provider]  rosuvastatin  (CRESTOR ) 40 MG tablet TAKE 1 TABLET EVERY DAY (NEED MD APPOINTMENT FOR REFILLS) 11/01/22  Yes Karna Fellows, MD  EPINEPHrine  0.3 mg/0.3 mL IJ SOAJ injection Inject 0.3 mg into the muscle as needed for anaphylaxis. Use as directed for severe allergic reaction 05/27/23   Kozlow, Eric J, MD    Inpatient medications:  levothyroxine   50 mcg Oral Q0600   mirtazapine  15 mg Oral QHS    Discontinued Meds:   Medications Discontinued During This Encounter  Medication Reason   aspirin  EC 81 MG tablet Patient Preference   Benralizumab  (FASENRA  PEN) 30 MG/ML SOAJ  Not covered by the pt's insurance   budesonide -formoterol  (SYMBICORT ) 160-4.5 MCG/ACT inhaler Change in therapy   Fluocinolone  Acetonide Scalp (DERMA-SMOOTHE /FS SCALP) 0.01 % OIL Patient Preference   fluticasone  (FLONASE ) 50 MCG/ACT nasal spray Patient Preference   levothyroxine  (SYNTHROID ) 50 MCG tablet Patient Preference   montelukast  (SINGULAIR ) 10 MG tablet Patient Preference   Polyethyl Glycol-Propyl Glycol (SYSTANE) 0.4-0.3 % GEL ophthalmic gel Patient Preference   esomeprazole  (NEXIUM ) 40 MG capsule Patient Preference    Social History:  reports that she quit smoking about 17 years ago. Her smoking use included cigarettes. She has never used smokeless tobacco. She reports that she does not drink alcohol  and does not use drugs.  Family History:   Family History  Problem Relation Age of Onset   Dementia Mother    Gallbladder disease Mother    Diabetes Father    Heart failure Father    Hypertension Father    Sleep apnea Father    Kidney disease Father    Dementia Father    Thyroid  disease Sister    Hypertension Sister    Dementia Sister    Healthy Sister     Pertinent items are noted in HPI. Weight change:   Intake/Output Summary (Last 24 hours) at 11/21/2023 1231 Last data filed at 11/21/2023 0819 Gross per 24 hour  Intake 220 ml  Output 0 ml  Net 220 ml   BP (!) 117/58 (BP Location: Right Arm)   Pulse (!) 46   Temp 97.7 F (36.5 C) (Oral)   Resp 19   Ht 5' 8 (1.727 m)   Wt 70.8 kg   SpO2 100%   BMI 23.72 kg/m  Vitals:   11/21/23 0130 11/21/23 0520 11/21/23 0759 11/21/23 0819  BP: (!) 128/47 101/64 (!) 99/58 (!) 117/58  Pulse: (!) 47 86 (!) 44 (!) 46  Resp: 18 16 19 19   Temp: 98 F (36.7 C) 98.6 F (37 C) 97.7 F (36.5 C)   TempSrc:  Oral Oral Oral   SpO2: 99% 97% 99% 100%  Weight:      Height:         General appearance: alert, cooperative, appears stated age, and no distress Head: Normocephalic, without obvious abnormality, atraumatic Resp:  clear to auscultation bilaterally Cardio: regular rate and rhythm, S1, S2 normal, no murmur, click, rub or gallop GI: soft, non-tender; bowel sounds normal; no masses,  no organomegaly Extremities: extremities normal, atraumatic, no cyanosis or edema  Labs: Basic Metabolic Panel: Recent Labs  Lab 11/20/23 1258 11/21/23 0625  NA 135 136  K 3.1* 3.4*  CL 95* 101  CO2 21* 22  GLUCOSE 106* 86  BUN 55* 45*  CREATININE 4.93* 3.96*  ALBUMIN 3.3* 2.7*  CALCIUM  9.3 8.6*   Liver Function Tests: Recent Labs  Lab 11/20/23 1258 11/21/23 0625  AST 50* 40  ALT 41 33  ALKPHOS 80 68  BILITOT 1.2 0.8  PROT 7.4 6.0*  ALBUMIN 3.3* 2.7*   No results for input(s): LIPASE, AMYLASE in the last 168 hours. No results for input(s): AMMONIA in the last 168 hours. CBC: Recent Labs  Lab 11/20/23 1258 11/21/23 0625  WBC 6.5 5.9  HGB 13.8 12.0  HCT 39.7 33.4*  MCV 77.1* 76.8*  PLT 159 143*   PT/INR: @LABRCNTIP (inr:5) Cardiac Enzymes: ) Recent Labs  Lab 11/20/23 1258  CKTOTAL 106   CBG: Recent Labs  Lab 11/21/23 0920 11/21/23 1140  GLUCAP 145* 153*    Iron Studies: No results for input(s): IRON, TIBC, TRANSFERRIN, FERRITIN in the last 168 hours.  Xrays/Other Studies: DG Lumbar Spine Complete Result Date: 11/20/2023 EXAM: 4 VIEW(S) XRAY OF THE LUMBAR SPINE 11/20/2023 06:25:00 PM COMPARISON: Lumbar spine x-ray 11/11/2023. CLINICAL HISTORY: Fall, pain. Per triage notes: EMS reports Pt fell 1 week ago and she laid on the ground 3 days. Pt c/o back/buttock pain and intermittent dizziness since fall. FINDINGS: LUMBAR SPINE: BONES: The bones are diffusely osteopenic. No acute fracture. No aggressive appearing osseous lesion. Alignment is normal. DISCS AND DEGENERATIVE CHANGES: Moderate degenerative changes at L4-L5 with disc space narrowing, sclerosis and osteophyte formation. SOFT TISSUES: There are atherosclerotic calcifications of the aorta. There are surgical clips in  the right upper abdomen. IMPRESSION: 1. No acute abnormality of the lumbar spine related to the reported fall and pain. 2. Moderate degenerative changes at L4-L5. Electronically signed by: Greig Pique MD 11/20/2023 07:24 PM EDT RP Workstation: [id number]   DG Sacrum/Coccyx Result Date: 11/20/2023 CLINICAL DATA:  Fall, pain EXAM: SACRUM AND COCCYX - 2+ VIEW COMPARISON:  None Available. FINDINGS: Mild symmetric degenerative changes in the hips with joint space narrowing and spurring. No acute bony abnormality. Specifically, no fracture, subluxation, or dislocation. Degenerative changes in the lower lumbar spine. IMPRESSION: No acute bony abnormality. Electronically Signed   By: Franky Crease M.D.   On: 11/20/2023 18:57     Assessment/Plan:  AKI, recurrent - presumably due to pre-renal/hypovolemia which lead to ischemic ATN in setting of hypotension.  She is feeling better and her BUN/Cr have started to improve.  Renal US  last week was unremarkable.  Hold off on repeating renal US  for now.  Continue with IVFs with lactated ringers  and follow UOP and daily Scr.  No current indication for dialysis at this time and will continue to follow.  Avoid nephrotoxic medications including NSAIDs and iodinated intravenous contrast exposure unless the latter is absolutely indicated.   Preferred narcotic agents for pain control are hydromorphone , fentanyl , and methadone. Morphine  should not be  used.  Avoid Baclofen and avoid oral sodium phosphate and magnesium  citrate based laxatives / bowel preps.  Continue strict Input and Output monitoring. Will monitor the patient closely with you and intervene or adjust therapy as indicated by changes in clinical status/labs  Hypokalemia - replete and follow closely (did have an issue with hyperkalemia at last hospitalization). FTT - not eating at home and was started on remeron at bedtime. HTN - hypotensive due to volume depletion, hold meds MGUS - SPEP/UPEP pending.  Chronic  combined systolic and diastolic CHF - last ECHO with EF 50% and grade I DD with normal RV function.  Stop jardiance  and would not resume given advanced age and propensity for volume depletion. Hypothyroidism - on synthroid . Dizziness - with recent fall.  Will check CK level HLD on crestor  at home, currently on hold.   [employer] LABOR Miyanna Wiersma 11/21/2023, 12:31 PM

## 2023-11-21 NOTE — Progress Notes (Signed)
 New Admission Note:   Arrival Method: Arrived from [facility] ED via stretcher Mental Orientation: Alert and oriented x4 Telemetry: Box #9 Assessment: Completed Skin: Intact IV:NSL-Lt AC Pain: 2/10 Tubes: N/A Safety Measures: Safety Fall Prevention Plan has been discussed.  Admission: Completed Orientation: Patient has been oriented to the room, unit and staff.  Family: None at bedside  Orders have been reviewed and implemented. Will continue to monitor the patient. Call light has been placed within reach and bed alarm has been activated.   Almee Pelphrey [employer], RN-BC Phone number: [phone]

## 2023-11-21 NOTE — Evaluation (Addendum)
 Physical Therapy Evaluation Patient Details Name: Andrea Lowery MRN: [id number] DOB: [birth year] Today's Date: 11/21/2023  History of Present Illness  Pt is an 83 y.o. female who presented 11/20/23 with abnormal renal labs. Of note, pt hospitalized 1 week ago (10/7-10/9) with AKI and had a fall 1 wk prior to that without fxs. Had had intermittent dizziness, back/ buttocks pain since then.  PMH: arthritis, asthma, DM, DVT, dyslipidemia, GERD, HTN, hypokalemia, hypothyroidism, LBBB, MGUS, OSA on CPAP, migraine  Clinical Impression  Pt admitted with above diagnosis. Pt from home with family who reports she stayed on the couch for 3 days without being able to get up and didn't tell anyone. Pt presents with significantly more weakness and instability than last PT eval on 10/8. Pt needed min A for bed mobility but mod A to ambulate due to frequent stagger to R, running into wall with RW, and increasing trunk flexion. Pt had dizziness in sitting as well as with ambulation and had nystagmus with head turns. Recommend vestibular eval. Patient will benefit from continued inpatient follow up therapy, <3 hours/day at this time due to weakness and instability however, will follow closely as renal function improves and update accordingly.  Pt currently with functional limitations due to the deficits listed below (see PT Problem List). Pt will benefit from acute skilled PT to increase their independence and safety with mobility to allow discharge.           If plan is discharge home, recommend the following: A little help with bathing/dressing/bathroom;Assistance with cooking/housework;Help with stairs or ramp for entrance;Assist for transportation;A lot of help with walking and/or transfers   Can travel by private vehicle        Equipment Recommendations None recommended by PT  Recommendations for Other Services  OT consult;Other (comment) (vestibular eval)    Functional Status Assessment Patient has  had a recent decline in their functional status and demonstrates the ability to make significant improvements in function in a reasonable and predictable amount of time.     Precautions / Restrictions Precautions Precautions: Fall Restrictions Weight Bearing Restrictions Per Provider Order: No      Mobility  Bed Mobility Overal bed mobility: Needs Assistance Bed Mobility: Supine to Sit, Sit to Supine     Supine to sit: Min assist, Used rails Sit to supine: HOB elevated, Min assist   General bed mobility comments: pt had difficulty moving LE's off EOB and back up into bed, min A needed each way    Transfers Overall transfer level: Needs assistance Equipment used: Rolling walker (2 wheels) Transfers: Sit to/from Stand Sit to Stand: Contact guard assist           General transfer comment: unsteady with standing, vc's for hand placement    Ambulation/Gait Ambulation/Gait assistance: Mod assist Gait Distance (Feet): 25 Feet Assistive device: Rolling walker (2 wheels) Gait Pattern/deviations: Step-through pattern, Decreased stance time - right, Staggering right Gait velocity: reduced Gait velocity interpretation: <1.31 ft/sec, indicative of household ambulator   General Gait Details: pt with R lean throughout gait with several staggers to R needing mod A to correct. Ran into wall on R when turning. vc's needed to stay close to RW. Reported dizziness throughout  Stairs            Wheelchair Mobility     Tilt Bed    Modified Rankin (Stroke Patients Only)       Balance Overall balance assessment: Needs assistance Sitting-balance support: No upper extremity supported, Feet  supported Sitting balance-Leahy Scale: Fair     Standing balance support: Bilateral upper extremity supported, During functional activity Standing balance-Leahy Scale: Poor Standing balance comment: needed UE support to maintain steady standing                              Pertinent Vitals/Pain Pain Assessment Pain Assessment: 0-10 Pain Score: 9  Pain Location: back and down LLE Pain Descriptors / Indicators: Discomfort, Grimacing, Guarding, Sore Pain Intervention(s): Limited activity within patient's tolerance, Monitored during session    Home Living Family/patient expects to be discharged to:: Private residence Living Arrangements: Children Available Help at Discharge: Family;Available 24 hours/day Type of Home: House Home Access: Stairs to enter Entrance Stairs-Rails: Left Entrance Stairs-Number of Steps: 3   Home Layout: One level Home Equipment: Cane - single point Additional Comments: Was living alone but plans to go live with son after d/c, his home info is above    Prior Function Prior Level of Function : Independent/Modified Independent             Mobility Comments: family has been assisting her since last admission due to dizziness ADLs Comments: Independent, 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 knee ext 3+/5, knee flex 3+/5. Of note, she was weaker R vs LLE last admission due to falling on that side but on eval today tested weaker on L RLE Sensation: WNL RLE Coordination: decreased gross motor LLE Deficits / Details: hip flex 2/5, knee ext 3-/5, knee flex 3-/5. Weaker LLE than RLE on eval but could possibly be due to back pain that she reports she feels down LLE when doing MMT on L LLE Sensation: WNL LLE Coordination: decreased gross motor    Cervical / Trunk Assessment Cervical / Trunk Assessment: Kyphotic  Communication   Communication Communication: No apparent difficulties    Cognition Arousal: Alert Behavior During Therapy:  WFL for tasks assessed/performed   PT - Cognitive impairments: Safety/Judgement                       PT - Cognition Comments: reports getting up into attic and falling out recently. Does not understand why she can't drive. AxOx4 but significant denial of current limitations despite acknowledging that she is dizzy Following commands: Intact       Cueing Cueing Techniques: Verbal cues     General Comments General comments (skin integrity, edema, etc.): pt with L beating nystagmus after R head turn. Reports occasional nausea. Reports that she has had some dizziness ever since her grandson died a few years ago but worsened after she fell 2 weeks ago    Exercises     Assessment/Plan    PT Assessment Patient needs continued PT services  PT Problem List Decreased strength;Decreased activity tolerance;Decreased balance;Decreased mobility;Pain;Decreased safety awareness;Decreased knowledge of precautions;Decreased knowledge of use of DME       PT Treatment Interventions DME instruction;Gait training;Functional mobility training;Stair training;Therapeutic activities;Balance training;Therapeutic exercise;Neuromuscular re-education;Patient/family education    PT Goals (Current goals can be found in the Care Plan section)  Acute Rehab PT Goals Patient Stated Goal: to improve and be able to walk in high heels again PT Goal Formulation: With patient/family  Time For Goal Achievement: 12/05/23 Potential to Achieve Goals: Good    Frequency Min 2X/week     Co-evaluation               AM-PAC PT 6 Clicks Mobility  Outcome Measure Help needed turning from your back to your side while in a flat bed without using bedrails?: A Little Help needed moving from lying on your back to sitting on the side of a flat bed without using bedrails?: A Little Help needed moving to and from a bed to a chair (including a wheelchair)?: A Little Help needed standing up from a chair using your  arms (e.g., wheelchair or bedside chair)?: A Little Help needed to walk in hospital room?: A Lot Help needed climbing 3-5 steps with a railing? : A Lot 6 Click Score: 16    End of Session Equipment Utilized During Treatment: Gait belt Activity Tolerance: Patient tolerated treatment well Patient left: in bed;with call bell/phone within reach;with family/visitor present;with bed alarm set Nurse Communication: Mobility status PT Visit Diagnosis: Unsteadiness on feet (R26.81);Other abnormalities of gait and mobility (R26.89);Muscle weakness (generalized) (M62.81);Difficulty in walking, not elsewhere classified (R26.2);History of falling (Z91.81);Pain Pain - Right/Left: Left Pain - part of body:  (back and L buttocks)    Time: 8866-8795 PT Time Calculation (min) (ACUTE ONLY): 31 min   Charges:   PT Evaluation $PT Eval Moderate Complexity: 1 Mod PT Treatments $Gait Training: 8-22 mins PT General Charges $$ ACUTE PT VISIT: 1 Visit         Richerd Lipoma, PT  Acute Rehab Services Secure chat preferred Office [phone]   Richerd CROME Magic Mohler 11/21/2023, 1:56 PM

## 2023-11-21 NOTE — Plan of Care (Signed)
   Problem: Clinical Measurements: Goal: Respiratory complications will improve Outcome: Progressing   Problem: Activity: Goal: Risk for activity intolerance will decrease Outcome: Progressing

## 2023-11-21 NOTE — TOC Progression Note (Signed)
 Transition of Care [facility]) - Progression Note    Patient Details  Name: Andrea Lowery MRN: [id number] Date of Birth: 02-20-1940  Transition of Care [facility]) CM/SW Contact  Lendia Dais, [place] Phone Number: 11/21/2023, 3:42 PM  Clinical Narrative:  CSW spoke to pt and pt's son Andrea Lowery at bedside.  CSW informed pt and Andrea Lowery of PT recs of STR at a SNF. Pt gave verbal permission for CSW to talk to Andrea Lowery and that Andrea Lowery can make decisions for the patient.   CSW explained that STR is temporary and not permanent and time can vary at rehab. Pt and Andrea Lowery were agreeable to SNF. CSW provided medicare.gov list. CSW informed pt that there will be a weekend Child psychotherapist.   Pt gave verbal permission for referrals to be sent out in the HUB and requested a referral be sent [facility].   TOC will continue to monitor for bed offers.                      Expected Discharge Plan and Services                                               Social Drivers of Health (SDOH) Interventions SDOH Screenings   Food Insecurity: No Food Insecurity (11/21/2023)  Housing: Low Risk  (11/21/2023)  Transportation Needs: No Transportation Needs (11/21/2023)  Utilities: Not At Risk (11/21/2023)  Alcohol  Screen: Low Risk  (07/31/2022)  Depression (PHQ2-9): Medium Risk (09/17/2022)  Financial Resource Strain: Low Risk  (07/31/2022)  Physical Activity: Inactive (07/31/2022)  Social Connections: Moderately Integrated (11/21/2023)  Stress: Stress Concern Present (07/31/2022)  Tobacco Use: Medium Risk (11/20/2023)    Readmission Risk Interventions    11/13/2023   10:35 AM  Readmission Risk Prevention Plan  Post Dischage Appt Complete  Medication Screening Complete  Transportation Screening Complete

## 2023-11-21 NOTE — Progress Notes (Signed)
 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 intake.  Patient was readmitted last night with intermittent dizziness and weakness and fall.  She continued to have decreased p.o. intake.  It is unclear if patient had been on the ground for a while.  On admission she was noted to be hypotensive with 86/51 and somewhat bradycardic at 45.. Laboratory data were notable for acute kidney injury again with creatinine of 5, up from 2.1 ten days ago at discharge.   Subjective:  Patient states she does not feel any different than when she came in.  Patient's family is very concerned that she was discharged with elevated creatinine and that she has come in with worsened kidney function.   Objective: Vitals:   11/21/23 0520 11/21/23 0759 11/21/23 0819 11/21/23 1610  BP: 101/64 (!) 99/58 (!) 117/58 (!) 102/56  Pulse: 86 (!) 44 (!) 46 (!) 49  Resp: 16 19 19 19   Temp: 98.6 F (37 C) 97.7 F (36.5 C)  97.7 F (36.5 C)  TempSrc: Oral Oral  Oral  SpO2: 97% 99% 100% 100%  Weight:      Height:        Intake/Output Summary (Last 24 hours) at 11/21/2023 1852 Last data filed at 11/21/2023 1737 Gross per 24 hour  Intake 1472.5 ml  Output 0 ml  Net 1472.5 ml   Filed Weights   11/20/23 1243  Weight: 70.8 kg     Exam:  General: Frail elderly female lying in bed with attentive son and granddaughter at bedside. CVS: S1-S2, regular  Respiratory:  decreased air entry bilaterally secondary to decreased inspiratory effort, rales at bases  GI: NABS, soft, NT  LE: Warm and well-perfused Neuro: A/O x 3,  grossly nonfocal.    Data Reviewed:  Basic Metabolic Panel: Recent Labs  Lab 11/20/23 1258 11/21/23 0625  NA 135 136  Lowery  3.1* 3.4*  CL 95* 101  CO2 21* 22  GLUCOSE 106* 86  BUN 55* 45*  CREATININE 4.93* 3.96*  CALCIUM  9.3 8.6*    CBC: Recent Labs  Lab 11/20/23 1258 11/21/23 0625  WBC 6.5 5.9  HGB 13.8 12.0  HCT 39.7 33.4*  MCV 77.1* 76.8*  PLT 159 143*     Scheduled Meds:  heparin  5,000 Units Subcutaneous Q8H   levothyroxine   50 mcg Oral Q0600   mirtazapine  15 mg Oral QHS   Continuous Infusions:  lactated ringers  125 mL/hr at 11/21/23 1751     Assessment & Plan:   Acute kidney injury Decreased p.o. intake CPK normal at 87, no evidence of rhabdo UA with some hemoglobin and protein, different from previous Glucosuria secondary to Jardiance  Appreciate nephrology consultation Continue hydration with LR and follow creatinine Patient started on Remeron to hopefully help with appetite  Hypokalemia Replete and recheck Careful to avoid overcorrection given episode of hyperkalemia last admission  HTN Hold antihypertensives  MGUS SPEP and UPEP are pending and ordered     DVT prophylaxis: Subcu heparin Code Status: Full Family Communication: Son and granddaughter were at bedside and daughter-in-law was on line via telephone   Studies: DG Lumbar Spine Complete Result Date: 11/20/2023 EXAM: 4 VIEW(S) XRAY OF THE LUMBAR SPINE 11/20/2023 06:25:00 PM COMPARISON: Lumbar spine  x-ray 11/11/2023. CLINICAL HISTORY: Fall, pain. Per triage notes: EMS reports Pt fell 1 week ago and she laid on the ground 3 days. Pt c/o back/buttock pain and intermittent dizziness since fall. FINDINGS: LUMBAR SPINE: BONES: The bones are diffusely osteopenic. No acute fracture. No aggressive appearing osseous lesion. Alignment is normal. DISCS AND DEGENERATIVE CHANGES: Moderate degenerative changes at L4-L5 with disc space narrowing, sclerosis and osteophyte formation. SOFT TISSUES: There are atherosclerotic calcifications of the aorta. There are surgical clips in the right upper abdomen. IMPRESSION: 1. No acute  abnormality of the lumbar spine related to the reported fall and pain. 2. Moderate degenerative changes at L4-L5. Electronically signed by: Greig Pique MD 11/20/2023 07:24 PM EDT RP Workstation: [id number]   DG Sacrum/Coccyx Result Date: 11/20/2023 CLINICAL DATA:  Fall, pain EXAM: SACRUM AND COCCYX - 2+ VIEW COMPARISON:  None Available. FINDINGS: Mild symmetric degenerative changes in the hips with joint space narrowing and spurring. No acute bony abnormality. Specifically, no fracture, subluxation, or dislocation. Degenerative changes in the lower lumbar spine. IMPRESSION: No acute bony abnormality. Electronically Signed   By: Franky Crease M.D.   On: 11/20/2023 18:57    Principal Problem:   AKI (acute kidney injury)     [name], [facility]  If 7PM-7AM, please contact night-coverage [email or web address]   LOS: 0 days

## 2023-11-22 DIAGNOSIS — N179 Acute kidney failure, unspecified: Secondary | ICD-10-CM | POA: Diagnosis not present

## 2023-11-22 LAB — RENAL FUNCTION PANEL
Albumin: 2.4 g/dL — ABNORMAL LOW (ref 3.5–5.0)
Anion gap: 9 (ref 5–15)
BUN: 34 mg/dL — ABNORMAL HIGH (ref 8–23)
CO2: 25 mmol/L (ref 22–32)
Calcium: 8.5 mg/dL — ABNORMAL LOW (ref 8.9–10.3)
Chloride: 105 mmol/L (ref 98–111)
Creatinine, Ser: 3.53 mg/dL — ABNORMAL HIGH (ref 0.44–1.00)
GFR, Estimated: 12 mL/min — ABNORMAL LOW (ref >60)
Glucose, Bld: 95 mg/dL (ref 70–99)
Phosphorus: 4 mg/dL (ref 2.5–4.6)
Potassium: 3 mmol/L — ABNORMAL LOW (ref 3.5–5.1)
Sodium: 139 mmol/L (ref 135–145)

## 2023-11-22 MED ORDER — POTASSIUM CHLORIDE 20 MEQ PO PACK
20.0000 meq | PACK | Freq: Once | ORAL | Status: AC
  Administered 2023-11-22: 20 meq via ORAL
  Filled 2023-11-22: qty 1

## 2023-11-22 MED ORDER — LIDOCAINE 5 % EX PTCH
3.0000 | MEDICATED_PATCH | CUTANEOUS | Status: DC
Start: 2023-11-22 — End: 2023-11-24
  Administered 2023-11-22 – 2023-11-23 (×2): 3 via TRANSDERMAL
  Filled 2023-11-22 (×2): qty 3

## 2023-11-22 MED ORDER — LACTATED RINGERS IV SOLN: INTRAVENOUS | Status: DC

## 2023-11-22 NOTE — Progress Notes (Signed)
 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 intake.  Patient was readmitted last night with intermittent dizziness and weakness and fall.  She continued to have decreased p.o. intake.  It is unclear if patient had been on the ground for a while.  On admission she was noted to be hypotensive with 86/51 and somewhat bradycardic at 45.. Laboratory data were notable for acute kidney injury again with creatinine of 5, up from 2.1 ten days ago at discharge.   Subjective:  States she feels much better, has more energy and feels like her usual self.  Patient's son states that she is much closer to baseline and is no longer confused.   Patient does complain of back and hip pain where she fell last week.  Objective: Vitals:   11/21/23 2047 11/22/23 0540 11/22/23 0749 11/22/23 1617  BP: (!) 96/44 (!) 108/47 (!) 120/48 (!) 109/56  Pulse: 60 (!) 45 93 (!) 51  Resp: 16 20  20   Temp: 98.1 F (36.7 C) 98.2 F (36.8 C) 98 F (36.7 C) 97.8 F (36.6 C)  TempSrc: Oral Oral Oral Oral  SpO2: 98% 94% 100% 98%  Weight:      Height:        Intake/Output Summary (Last 24 hours) at 11/22/2023 2012 Last data filed at 11/22/2023 0400 Gross per 24 hour  Intake 1365 ml  Output 400 ml  Net 965 ml   Filed Weights   11/20/23 1243  Weight: 70.8 kg     Exam:  General: Patient much more energetic and in good spirits lying in bed in NAD CVS: S1-S2, regular  Respiratory:  decreased air entry bilaterally secondary to decreased inspiratory effort, rales at bases  GI: NABS, soft, NT  LE: Warm and well-perfused Neuro: A/O x 3,  grossly nonfocal.    Data Reviewed:  Basic Metabolic Panel: Recent Labs  Lab 11/20/23 1258  11/21/23 0625 11/21/23 1832 11/22/23 0331  NA 135 136  --  139  K 3.1* 3.4*  --  3.0*  CL 95* 101  --  105  CO2 21* 22  --  25  GLUCOSE 106* 86  --  95  BUN 55* 45*  --  34*  CREATININE 4.93* 3.96* 3.64* 3.53*  CALCIUM  9.3 8.6*  --  8.5*  PHOS  --   --   --  4.0    CBC: Recent Labs  Lab 11/20/23 1258 11/21/23 0625 11/21/23 1832  WBC 6.5 5.9 5.1  HGB 13.8 12.0 11.9*  HCT 39.7 33.4* 34.8*  MCV 77.1* 76.8* 80.9  PLT 159 143* 133*     Scheduled Meds:  heparin  5,000 Units Subcutaneous Q8H   levothyroxine   50 mcg Oral Q0600   lidocaine   3 patch Transdermal Q24H   mirtazapine  15 mg Oral QHS   Continuous Infusions:  lactated ringers  125 mL/hr at 11/22/23 1814     Assessment & Plan:   Acute kidney injury Decreased p.o. intake Appreciate nephrology consultation They believe this is also most likely secondary to decreased p.o. intake/hypovolemia Continue management with LR Despite fall, CPK normal at 87, no evidence of rhabdo  Decreased p.o. intake Appetite does seem to have improved with initiation of Remeron yesterday  Hypokalemia  Potassium has decreased despite repletion Will provide K-Dur 40 p.o. 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   Principal Problem:   AKI (acute kidney injury)     Livia Tarr Vangie Pike, [facility]  If 7PM-7AM, please contact night-coverage [email or web address]   LOS: 1 day

## 2023-11-22 NOTE — Progress Notes (Signed)
 OT Cancellation Note  Patient Details Name: Andrea Lowery MRN: [id number] DOB: [birth year]   Cancelled Treatment:    Reason Eval/Treat Not Completed: Patient declined, no reason specified. Pt kindly requests return tomorrow.   Andrea Lowery FREDERICK, OTR/L [facility] Acute Rehabilitation Office: [phone]   Andrea Lowery 11/22/2023, 3:50 PM

## 2023-11-22 NOTE — Plan of Care (Signed)
   Problem: Education: Goal: Knowledge of General Education information will improve Description: Including pain rating scale, medication(s)/side effects and non-pharmacologic comfort measures Outcome: Progressing   Problem: Activity: Goal: Risk for activity intolerance will decrease Outcome: Progressing   Problem: Nutrition: Goal: Adequate nutrition will be maintained Outcome: Progressing   Problem: Coping: Goal: Level of anxiety will decrease Outcome: Progressing

## 2023-11-22 NOTE — Progress Notes (Signed)
 Mobility Specialist Progress Note:    11/22/23 1340  Mobility  Activity Turned to back - supine (Ankle pump, leg lifts, heel slides)  Level of Assistance Standby assist, set-up cues, supervision of patient - no hands on  Assistive Device None  Range of Motion/Exercises Left leg;Right leg  Activity Response Tolerated well  Mobility Referral Yes  Mobility visit 1 Mobility  Mobility Specialist Start Time (ACUTE ONLY) 1340  Mobility Specialist Stop Time (ACUTE ONLY) 1359  Mobility Specialist Time Calculation (min) (ACUTE ONLY) 19 min   Pt laying in bed w/ family in room pleasant but unwillingness to ambulate. Willing to do bed level exercises. Pt able to move well. Left pt in bed w/ all needs met.   Venetia Keel Mobility Specialist Please Neurosurgeon or Rehab Office at [phone]

## 2023-11-22 NOTE — Progress Notes (Signed)
 Patient ID: Andrea Lowery, female   DOB: [birth year], 83 y.o.   MRN: [id number] S: Feels better this morning.  Eating better. O:BP (!) 120/48 (BP Location: Left Arm)   Pulse 93   Temp 98 F (36.7 C) (Oral)   Resp 20   Ht 5' 8 (1.727 m)   Wt 70.8 kg   SpO2 100%   BMI 23.72 kg/m   Intake/Output Summary (Last 24 hours) at 11/22/2023 1225 Last data filed at 11/22/2023 0400 Gross per 24 hour  Intake 2867.5 ml  Output 400 ml  Net 2467.5 ml   Intake/Output: I/O last 3 completed shifts: In: 3087.5 [P.O.:800; I.V.:2287.5] Out: 400 [Urine:400]  Intake/Output this shift:  No intake/output data recorded. Weight change:  Gen: NAD CVS: RRR Resp:CTA Abd: +BS, soft, NT/ND Ext: no edema  Recent Labs  Lab 11/20/23 1258 11/21/23 0625 11/21/23 1832 11/22/23 0331  NA 135 136  --  139  K 3.1* 3.4*  --  3.0*  CL 95* 101  --  105  CO2 21* 22  --  25  GLUCOSE 106* 86  --  95  BUN 55* 45*  --  34*  CREATININE 4.93* 3.96* 3.64* 3.53*  ALBUMIN 3.3* 2.7*  --  2.4*  CALCIUM  9.3 8.6*  --  8.5*  PHOS  --   --   --  4.0  AST 50* 40  --   --   ALT 41 33  --   --    Liver Function Tests: Recent Labs  Lab 11/20/23 1258 11/21/23 0625 11/22/23 0331  AST 50* 40  --   ALT 41 33  --   ALKPHOS 80 68  --   BILITOT 1.2 0.8  --   PROT 7.4 6.0*  --   ALBUMIN 3.3* 2.7* 2.4*   No results for input(s): LIPASE, AMYLASE in the last 168 hours. No results for input(s): AMMONIA in the last 168 hours. CBC: Recent Labs  Lab 11/20/23 1258 11/21/23 0625 11/21/23 1832  WBC 6.5 5.9 5.1  HGB 13.8 12.0 11.9*  HCT 39.7 33.4* 34.8*  MCV 77.1* 76.8* 80.9  PLT 159 143* 133*   Cardiac Enzymes: Recent Labs  Lab 11/20/23 1258 11/21/23 1620  CKTOTAL 106 86   CBG: Recent Labs  Lab 11/21/23 0920 11/21/23 1140 11/21/23 1607 11/21/23 2052  GLUCAP 145* 153* 140* 141*    Iron Studies: No results for input(s): IRON, TIBC, TRANSFERRIN, FERRITIN in the last 72  hours. Studies/Results: DG Lumbar Spine Complete Result Date: 11/20/2023 EXAM: 4 VIEW(S) XRAY OF THE LUMBAR SPINE 11/20/2023 06:25:00 PM COMPARISON: Lumbar spine x-ray 11/11/2023. CLINICAL HISTORY: Fall, pain. Per triage notes: EMS reports Pt fell 1 week ago and she laid on the ground 3 days. Pt c/o back/buttock pain and intermittent dizziness since fall. FINDINGS: LUMBAR SPINE: BONES: The bones are diffusely osteopenic. No acute fracture. No aggressive appearing osseous lesion. Alignment is normal. DISCS AND DEGENERATIVE CHANGES: Moderate degenerative changes at L4-L5 with disc space narrowing, sclerosis and osteophyte formation. SOFT TISSUES: There are atherosclerotic calcifications of the aorta. There are surgical clips in the right upper abdomen. IMPRESSION: 1. No acute abnormality of the lumbar spine related to the reported fall and pain. 2. Moderate degenerative changes at L4-L5. Electronically signed by: Greig Pique MD 11/20/2023 07:24 PM EDT RP Workstation: [id number]   DG Sacrum/Coccyx Result Date: 11/20/2023 CLINICAL DATA:  Fall, pain EXAM: SACRUM AND COCCYX - 2+ VIEW COMPARISON:  None Available. FINDINGS: Mild symmetric degenerative changes in the hips  with joint space narrowing and spurring. No acute bony abnormality. Specifically, no fracture, subluxation, or dislocation. Degenerative changes in the lower lumbar spine. IMPRESSION: No acute bony abnormality. Electronically Signed   By: Franky Crease M.D.   On: 11/20/2023 18:57    heparin  5,000 Units Subcutaneous Q8H   levothyroxine   50 mcg Oral Q0600   mirtazapine  15 mg Oral QHS    BMET    Component Value Date/Time   NA 139 11/22/2023 0331   NA 140 09/17/2022 1140   NA 140 04/23/2016 1303   K 3.0 (L) 11/22/2023 0331   K 2.8 (LL) 04/23/2016 1303   CL 105 11/22/2023 0331   CO2 25 11/22/2023 0331   CO2 28 04/23/2016 1303   GLUCOSE 95 11/22/2023 0331   GLUCOSE 156 (H) 04/23/2016 1303   BUN 34 (H) 11/22/2023 0331   BUN 6 (L)  09/17/2022 1140   BUN 6.9 (L) 04/23/2016 1303   CREATININE 3.53 (H) 11/22/2023 0331   CREATININE 0.79 07/22/2023 1335   CREATININE 0.9 04/23/2016 1303   CALCIUM  8.5 (L) 11/22/2023 0331   CALCIUM  9.7 04/23/2016 1303   GFRNONAA 12 (L) 11/22/2023 0331   GFRNONAA >60 07/22/2023 1335   GFRAA >60 02/02/2019 0339   GFRAA >60 05/29/2018 1141   CBC    Component Value Date/Time   WBC 5.1 11/21/2023 1832   RBC 4.30 11/21/2023 1832   HGB 11.9 (L) 11/21/2023 1832   HGB 12.8 07/22/2023 1335   HGB 11.6 04/23/2016 1303   HCT 34.8 (L) 11/21/2023 1832   HCT 34.6 (L) 04/23/2016 1303   PLT 133 (L) 11/21/2023 1832   PLT 196 07/22/2023 1335   PLT 186 04/23/2016 1303   MCV 80.9 11/21/2023 1832   MCV 81.9 04/23/2016 1303   MCH 27.7 11/21/2023 1832   MCHC 34.2 11/21/2023 1832   RDW 14.0 11/21/2023 1832   RDW 13.9 04/23/2016 1303   LYMPHSABS 1.6 11/12/2023 0552   LYMPHSABS 1.6 04/23/2016 1303   MONOABS 0.8 11/12/2023 0552   MONOABS 0.6 04/23/2016 1303   EOSABS 0.1 11/12/2023 0552   EOSABS 0.2 04/23/2016 1303   BASOSABS 0.1 11/12/2023 0552   BASOSABS 0.1 04/23/2016 1303    Assessment/Plan:  AKI, recurrent - presumably due to pre-renal/hypovolemia which lead to ischemic ATN in setting of hypotension.  She is feeling better and her BUN/Cr have started to improve.  Renal US  last week was unremarkable.  Hold off on repeating renal US  for now.  Continue with IVFs with lactated ringers  and follow UOP and daily Scr.  No current indication for dialysis at this time and will continue to follow.  BUN/Cr slowly improving.  She does have new blood and protein on UA.  Will start GN workup for completeness.  Avoid nephrotoxic medications including NSAIDs and iodinated intravenous contrast exposure unless the latter is absolutely indicated.   Preferred narcotic agents for pain control are hydromorphone , fentanyl , and methadone. Morphine  should not be used.  Avoid Baclofen and avoid oral sodium phosphate and  magnesium  citrate based laxatives / bowel preps.  Continue strict Input and Output monitoring. Will monitor the patient closely with you and intervene or adjust therapy as indicated by changes in clinical status/labs  Hypokalemia - replete and follow closely (did have an issue with hyperkalemia at last hospitalization). FTT - not eating at home and was started on remeron at bedtime.  Reports improved appetite this morning.  HTN - hypotensive due to volume depletion, hold meds MGUS - SPEP/UPEP pending.  Chronic combined systolic and diastolic CHF - last ECHO with EF 50% and grade I DD with normal RV function.  Stop jardiance  and would not resume given advanced age and propensity for volume depletion. Hypothyroidism - on synthroid . Dizziness - with recent fall.  Will checked CK level and was normal HLD on crestor  at home, currently on hold.  Fairy RONAL Sellar, MD [facility]

## 2023-11-23 DIAGNOSIS — E876 Hypokalemia: Secondary | ICD-10-CM | POA: Diagnosis not present

## 2023-11-23 DIAGNOSIS — N179 Acute kidney failure, unspecified: Secondary | ICD-10-CM | POA: Diagnosis not present

## 2023-11-23 LAB — RENAL FUNCTION PANEL
Albumin: 2.3 g/dL — ABNORMAL LOW (ref 3.5–5.0)
Anion gap: 11 (ref 5–15)
BUN: 19 mg/dL (ref 8–23)
CO2: 22 mmol/L (ref 22–32)
Calcium: 8.6 mg/dL — ABNORMAL LOW (ref 8.9–10.3)
Chloride: 108 mmol/L (ref 98–111)
Creatinine, Ser: 2.75 mg/dL — ABNORMAL HIGH (ref 0.44–1.00)
GFR, Estimated: 17 mL/min — ABNORMAL LOW (ref >60)
Glucose, Bld: 90 mg/dL (ref 70–99)
Phosphorus: 3.5 mg/dL (ref 2.5–4.6)
Potassium: 2.8 mmol/L — ABNORMAL LOW (ref 3.5–5.1)
Sodium: 141 mmol/L (ref 135–145)

## 2023-11-23 LAB — URIC ACID, RANDOM URINE: Uric Acid, Urine: 16.3 mg/dL

## 2023-11-23 LAB — MAGNESIUM: Magnesium: 1.4 mg/dL — ABNORMAL LOW (ref 1.7–2.4)

## 2023-11-23 LAB — TSH: TSH: 2.771 u[IU]/mL (ref 0.350–4.500)

## 2023-11-23 MED ORDER — MAGNESIUM SULFATE 2 GM/50ML IV SOLN
2.0000 g | Freq: Once | INTRAVENOUS | Status: AC
  Administered 2023-11-23: 2 g via INTRAVENOUS
  Filled 2023-11-23: qty 50

## 2023-11-23 MED ORDER — EZETIMIBE 10 MG PO TABS
10.0000 mg | ORAL_TABLET | Freq: Every day | ORAL | Status: DC
  Administered 2023-11-23 – 2023-11-24 (×2): 10 mg via ORAL
  Filled 2023-11-23 (×2): qty 1

## 2023-11-23 MED ORDER — ALLOPURINOL 100 MG PO TABS
100.0000 mg | ORAL_TABLET | Freq: Every day | ORAL | Status: DC
  Administered 2023-11-23 – 2023-11-24 (×2): 100 mg via ORAL
  Filled 2023-11-23 (×2): qty 1

## 2023-11-23 MED ORDER — FLUTICASONE FUROATE-VILANTEROL 100-25 MCG/ACT IN AEPB
1.0000 | INHALATION_SPRAY | Freq: Every day | RESPIRATORY_TRACT | Status: DC
  Administered 2023-11-23 – 2023-11-24 (×2): 1 via RESPIRATORY_TRACT
  Filled 2023-11-23: qty 28

## 2023-11-23 MED ORDER — POTASSIUM CHLORIDE 20 MEQ PO PACK
40.0000 meq | PACK | Freq: Two times a day (BID) | ORAL | Status: DC
  Administered 2023-11-23 – 2023-11-24 (×3): 40 meq via ORAL
  Filled 2023-11-23 (×3): qty 2

## 2023-11-23 NOTE — Plan of Care (Signed)

## 2023-11-23 NOTE — Progress Notes (Addendum)
 PROGRESS NOTE   Andrea Lowery  FMW:[id number]    DOB: [birth year]    DOA: 11/20/2023  PCP: Kahoano, Haku K, MD   I have briefly reviewed patients previous medical records in [facility].   Brief Hospital Course:  83 year old female, lives alone, with HTN, MGUS, asthma and GERD was discharged 10 days ago after treatment for acute kidney injury which was attributed to decreased p.o. intake. Patient was readmitted 10/16 with intermittent dizziness and weakness and fall. She continued to have decreased p.o. intake. It is unclear if patient had been on the ground for a while. On admission she was noted to be hypotensive with 86/51 and somewhat bradycardic at 45.. Laboratory data were notable for acute kidney injury again with creatinine of 5, up from 2.1 ten days ago at discharge.  Admitted for acute kidney injury, recurrent due to poor oral intake.  Nephrology consulting.  Improving.   Assessment & Plan:   Acute kidney injury, recurrent Suspected due to prerenal/hypovolemia and related hypotension with possible ischemic ATN. Unremarkable renal ultrasound on 10/7. Nephrology follow-up appreciated.  Since she has new blood and protein on UA, they are initiating GN workup. Treating with IV fluids, creatinine steadily improving, down from 4.9 on admission to 2.75.  Continue to trend daily BMP. Counseled patient extensively in the presence of her family at bedside that she should ensure consistent adequate p.o. fluid intake.  Hypokalemia/hypomagnesemia Potassium 2.8, magnesium  1.4.  Replace and follow  Essential hypertension Soft to normal blood pressures off of meds.  Continue to hold antihypertensives and monitor closely  MGUS SPEP/UPEP pending  Chronic combined systolic and diastolic CHF Most recent echo showed LVEF 50% with grade 1 diastolic dysfunction. Per nephrology, stop Jardiance  and would not resume given advanced age and propensity for volume depletion and I  agree  Hypothyroid Continue Synthroid   Dizziness and fall CK normal. PT and OT recommend SNF, ICM consulted. No pain reported today.  Dyslipidemia On Crestor  at home, currently on hold and resume at discharge.  Adult failure to thrive Poor oral intake Etiology unclear.  Patient reports that she is not eating much in the hospital because she does not like the taste of the food. Remeron 15 mg nightly initiated, continue at discharge.  Anemia and thrombocytopenia Follow CBCs.   Body mass index is 23.72 kg/m.   DVT prophylaxis: heparin injection 5,000 Units Start: 11/21/23 2200 SCDs Start: 11/20/23 1703     Code Status: Full Code:  Family Communication: Son and daughter-in-law at bedside. Disposition:  Status is: Inpatient Remains inpatient appropriate because: Ongoing IV fluids for AKI.     Consultants:   Nephrology  Procedures:     Subjective:  Seen along with son and daughter-in-law at bedside.  Patient denies complaints.  Does not appear to have eaten much of her breakfast and has her lunch tray in front of her but states that she does not like the food.  As per daughter-in-law, even with her home food, she does not eat well.  No pain or any other complaints reported.  Objective:   Vitals:   11/22/23 1617 11/22/23 2129 11/23/23 0615 11/23/23 0921  BP: (!) 109/56 133/65 (!) 110/55 (!) 130/54  Pulse: (!) 51 66 82 65  Resp: 20 19 19 18   Temp: 97.8 F (36.6 C) 98 F (36.7 C) 98.1 F (36.7 C) 98.6 F (37 C)  TempSrc: Oral Oral Oral   SpO2: 98% 98% 96% 94%  Weight:      Height:  General exam: Elderly female, moderately built and nourished sitting up comfortably in reclining chair without distress.  Oral mucosa with borderline hydration. Respiratory system: Clear to auscultation. Respiratory effort normal. Cardiovascular system: S1 & S2 heard, RRR. No JVD, murmurs, rubs, gallops or clicks. No pedal edema.  Telemetry personally reviewed: SB in the 40-50,  SR. Gastrointestinal system: Abdomen is nondistended, soft and nontender. No organomegaly or masses felt. Normal bowel sounds heard. Central nervous system: Alert and oriented. No focal neurological deficits. Extremities: Symmetric 5 x 5 power. Skin: No rashes, lesions or ulcers Psychiatry: Judgement and insight appear somewhat impaired. Mood & affect appropriate.     Data Reviewed:   I have personally reviewed following labs and imaging studies   CBC: Recent Labs  Lab 11/20/23 1258 11/21/23 0625 11/21/23 1832  WBC 6.5 5.9 5.1  HGB 13.8 12.0 11.9*  HCT 39.7 33.4* 34.8*  MCV 77.1* 76.8* 80.9  PLT 159 143* 133*    Basic Metabolic Panel: Recent Labs  Lab 11/20/23 1258 11/21/23 0625 11/21/23 1832 11/22/23 0331 11/23/23 0418 11/23/23 0419  NA 135 136  --  139  --  141  K 3.1* 3.4*  --  3.0*  --  2.8*  CL 95* 101  --  105  --  108  CO2 21* 22  --  25  --  22  GLUCOSE 106* 86  --  95  --  90  BUN 55* 45*  --  34*  --  19  CREATININE 4.93* 3.96* 3.64* 3.53*  --  2.75*  CALCIUM  9.3 8.6*  --  8.5*  --  8.6*  MG  --   --   --   --  1.4*  --   PHOS  --   --   --  4.0  --  3.5    Liver Function Tests: Recent Labs  Lab 11/20/23 1258 11/21/23 0625 11/22/23 0331 11/23/23 0419  AST 50* 40  --   --   ALT 41 33  --   --   ALKPHOS 80 68  --   --   BILITOT 1.2 0.8  --   --   PROT 7.4 6.0*  --   --   ALBUMIN 3.3* 2.7* 2.4* 2.3*    CBG: Recent Labs  Lab 11/21/23 1140 11/21/23 1607 11/21/23 2052  GLUCAP 153* 140* 141*    Microbiology Studies:  No results found for this or any previous visit (from the past 240 hours).  Radiology Studies:  No results found.  Scheduled Meds:    heparin  5,000 Units Subcutaneous Q8H   levothyroxine   50 mcg Oral Q0600   lidocaine   3 patch Transdermal Q24H   mirtazapine  15 mg Oral QHS   potassium chloride   40 mEq Oral BID    Continuous Infusions:    lactated ringers  125 mL/hr at 11/23/23 1155     LOS: 2 days     Trenda Mar, MD,  FACP, [facility], [facility], [facility]   [facility] [place]      To contact the attending provider between 7A-7P or the covering provider during after hours 7P-7A, please log into the web site [email or web address] and access using universal [place] password for that web site. If you do not have the password, please call the hospital operator.  11/23/2023, 1:54 PM

## 2023-11-23 NOTE — Evaluation (Signed)
 Occupational Therapy Evaluation Patient Details Name: Andrea Lowery MRN: [id number] DOB: Jul 01, 1940 Today's Date: 11/23/2023   History of Present Illness   Pt is an 83 y.o. female who presented 11/20/23 with abnormal renal labs. Of note, pt hospitalized 1 week ago (10/7-10/9) with AKI and had a fall 1 wk prior to that without fxs. Had had intermittent dizziness, back/ buttocks pain since then.  PMH: arthritis, asthma, DM, DVT, dyslipidemia, GERD, HTN, hypokalemia, hypothyroidism, LBBB, MGUS, OSA on CPAP, migraine     Clinical Impressions PTA, pt living alone and independent. Pt family intermittently assisting her since prior admission. Pt plans to go stay with son after this hospitalization. Pt currently limited by strength, balance, and activity tolerance. Pt needing up to CGA during BADL OOB, and up to min A for LB ADL. Will continue to follow. Recommending inpatient rehab <3 hours at discharge unless family can provide assist as needed, then pt may go home with HHOT.      If plan is discharge home, recommend the following:   A little help with walking and/or transfers;Assistance with cooking/housework;Assist for transportation;A little help with bathing/dressing/bathroom     Functional Status Assessment   Patient has had a recent decline in their functional status and demonstrates the ability to make significant improvements in function in a reasonable and predictable amount of time.     Equipment Recommendations   None recommended by OT     Recommendations for Other Services         Precautions/Restrictions   Precautions Precautions: Fall Restrictions Weight Bearing Restrictions Per Provider Order: No     Mobility Bed Mobility Overal bed mobility: Needs Assistance Bed Mobility: Supine to Sit     Supine to sit: Supervision          Transfers Overall transfer level: Needs assistance Equipment used: Rolling walker (2 wheels) Transfers: Sit to/from  Stand Sit to Stand: Contact guard assist                  Balance Overall balance assessment: Needs assistance Sitting-balance support: No upper extremity supported, Feet supported       Standing balance support: Bilateral upper extremity supported, During functional activity Standing balance-Leahy Scale: Poor                             ADL either performed or assessed with clinical judgement   ADL Overall ADL's : Needs assistance/impaired Eating/Feeding: Independent;Sitting   Grooming: Contact guard assist;Standing   Upper Body Bathing: Supervision/ safety   Lower Body Bathing: Minimal assistance   Upper Body Dressing : Supervision/safety   Lower Body Dressing: Minimal assistance   Toilet Transfer: Contact guard assist   Toileting- Clothing Manipulation and Hygiene: Contact guard assist;Sitting/lateral lean       Functional mobility during ADLs: Contact guard assist;Rolling walker (2 wheels)       Vision Baseline Vision/History: 1 Wears glasses Patient Visual Report: No change from baseline Vision Assessment?: No apparent visual deficits     Perception Perception: Within Functional Limits       Praxis Praxis: WFL       Pertinent Vitals/Pain Pain Assessment Pain Assessment: Faces Faces Pain Scale: Hurts a little bit Pain Location: generally stiff feeling Pain Descriptors / Indicators: Discomfort, Grimacing, Guarding, Sore Pain Intervention(s): Limited activity within patient's tolerance, Monitored during session     Extremity/Trunk Assessment Upper Extremity Assessment Upper Extremity Assessment: Generalized weakness  Cervical / Trunk Assessment Cervical / Trunk Assessment: Kyphotic   Communication Communication Communication: No apparent difficulties   Cognition Arousal: Alert Behavior During Therapy: WFL for tasks assessed/performed Cognition: No apparent impairments                                Following commands: Intact       Cueing  General Comments   Cueing Techniques: Verbal cues      Exercises     Shoulder Instructions      Home Living Family/patient expects to be discharged to:: Private residence Living Arrangements: Children Available Help at Discharge: Family;Available 24 hours/day Type of Home: House Home Access: Stairs to enter [employer] of Steps: 3 Entrance Stairs-Rails: Left Home Layout: One level     Bathroom Shower/Tub: Chief Strategy Officer: Standard     Home Equipment: Cane - single point   Additional Comments: above information for son's home      Prior Functioning/Environment Prior Level of Function : Independent/Modified Independent             Mobility Comments: family has been assisting her since last admission due to dizziness ADLs Comments: Independent, 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, but has not been able to past month    OT Problem List: Decreased strength;Decreased activity tolerance;Decreased knowledge of use of DME or AE;Decreased knowledge of precautions   OT Treatment/Interventions: Self-care/ADL training;Therapeutic exercise;Therapeutic activities;Patient/family education;DME and/or AE instruction      OT Goals(Current goals can be found in the care plan section)   Acute Rehab OT Goals Patient Stated Goal: get better OT Goal Formulation: With patient Time For Goal Achievement: 12/07/23 Potential to Achieve Goals: Good   OT Frequency:  Min 2X/week    Co-evaluation              AM-PAC OT 6 Clicks Daily Activity     Outcome Measure Help from another person eating meals?: None Help from another person taking care of personal grooming?: A Little Help from another person toileting, which includes using toliet, bedpan, or urinal?: A Little Help from another person bathing (including washing, rinsing, drying)?: A Little Help from another person to put on and taking off regular upper body  clothing?: None Help from another person to put on and taking off regular lower body clothing?: A Little 6 Click Score: 20   End of Session Equipment Utilized During Treatment: Gait belt;Rolling walker (2 wheels) Nurse Communication: Mobility status  Activity Tolerance: Patient tolerated treatment well Patient left: in bed;with call bell/phone within reach  OT Visit Diagnosis: Unsteadiness on feet (R26.81);Muscle weakness (generalized) (M62.81)                Time: 1040-1109 OT Time Calculation (min): 29 min Charges:  OT General Charges $OT Visit: 1 Visit OT Evaluation $OT Eval Low Complexity: 1 Low OT Treatments $Self Care/Home Management : 8-22 mins  Andrea Lowery, OTR/L [facility] Acute Rehabilitation Office: [phone]   Andrea JONETTA Lebron 11/23/2023, 11:21 AM

## 2023-11-23 NOTE — Progress Notes (Signed)
 Patient ID: Andrea Lowery, female   DOB: 1940/07/02, 83 y.o.   MRN: [id number] S: No new complaints.  Son and daughter in law in room and report that she is not eating and drinking very little. O:BP (!) 130/54   Pulse 65   Temp 98.6 F (37 C)   Resp 18   Ht 5' 8 (1.727 m)   Wt 70.8 kg   SpO2 94%   BMI 23.72 kg/m   Intake/Output Summary (Last 24 hours) at 11/23/2023 1205 Last data filed at 11/23/2023 0618 Gross per 24 hour  Intake 904.58 ml  Output 1200 ml  Net -295.42 ml   Intake/Output: I/O last 3 completed shifts: In: 2269.6 [P.O.:480; I.V.:1789.6] Out: 1600 [Urine:1600]  Intake/Output this shift:  No intake/output data recorded. Weight change:  Gen:NAD CVS: RRR Resp:CTA  Abd:+Bs, soft, NT/ND Ext: no edema  Recent Labs  Lab 11/20/23 1258 11/21/23 0625 11/21/23 1832 11/22/23 0331 11/23/23 0419  NA 135 136  --  139 141  K 3.1* 3.4*  --  3.0* 2.8*  CL 95* 101  --  105 108  CO2 21* 22  --  25 22  GLUCOSE 106* 86  --  95 90  BUN 55* 45*  --  34* 19  CREATININE 4.93* 3.96* 3.64* 3.53* 2.75*  ALBUMIN 3.3* 2.7*  --  2.4* 2.3*  CALCIUM  9.3 8.6*  --  8.5* 8.6*  PHOS  --   --   --  4.0 3.5  AST 50* 40  --   --   --   ALT 41 33  --   --   --    Liver Function Tests: Recent Labs  Lab 11/20/23 1258 11/21/23 0625 11/22/23 0331 11/23/23 0419  AST 50* 40  --   --   ALT 41 33  --   --   ALKPHOS 80 68  --   --   BILITOT 1.2 0.8  --   --   PROT 7.4 6.0*  --   --   ALBUMIN 3.3* 2.7* 2.4* 2.3*   No results for input(s): LIPASE, AMYLASE in the last 168 hours. No results for input(s): AMMONIA in the last 168 hours. CBC: Recent Labs  Lab 11/20/23 1258 11/21/23 0625 11/21/23 1832  WBC 6.5 5.9 5.1  HGB 13.8 12.0 11.9*  HCT 39.7 33.4* 34.8*  MCV 77.1* 76.8* 80.9  PLT 159 143* 133*   Cardiac Enzymes: Recent Labs  Lab 11/20/23 1258 11/21/23 1620  CKTOTAL 106 86   CBG: Recent Labs  Lab 11/21/23 0920 11/21/23 1140 11/21/23 1607 11/21/23 2052   GLUCAP 145* 153* 140* 141*    Iron Studies: No results for input(s): IRON, TIBC, TRANSFERRIN, FERRITIN in the last 72 hours. Studies/Results: No results found.  heparin  5,000 Units Subcutaneous Q8H   levothyroxine   50 mcg Oral Q0600   lidocaine   3 patch Transdermal Q24H   mirtazapine  15 mg Oral QHS    BMET    Component Value Date/Time   NA 141 11/23/2023 0419   NA 140 09/17/2022 1140   NA 140 04/23/2016 1303   K 2.8 (L) 11/23/2023 0419   K 2.8 (LL) 04/23/2016 1303   CL 108 11/23/2023 0419   CO2 22 11/23/2023 0419   CO2 28 04/23/2016 1303   GLUCOSE 90 11/23/2023 0419   GLUCOSE 156 (H) 04/23/2016 1303   BUN 19 11/23/2023 0419   BUN 6 (L) 09/17/2022 1140   BUN 6.9 (L) 04/23/2016 1303   CREATININE  2.75 (H) 11/23/2023 0419   CREATININE 0.79 07/22/2023 1335   CREATININE 0.9 04/23/2016 1303   CALCIUM  8.6 (L) 11/23/2023 0419   CALCIUM  9.7 04/23/2016 1303   GFRNONAA 17 (L) 11/23/2023 0419   GFRNONAA >60 07/22/2023 1335   GFRAA >60 02/02/2019 0339   GFRAA >60 05/29/2018 1141   CBC    Component Value Date/Time   WBC 5.1 11/21/2023 1832   RBC 4.30 11/21/2023 1832   HGB 11.9 (L) 11/21/2023 1832   HGB 12.8 07/22/2023 1335   HGB 11.6 04/23/2016 1303   HCT 34.8 (L) 11/21/2023 1832   HCT 34.6 (L) 04/23/2016 1303   PLT 133 (L) 11/21/2023 1832   PLT 196 07/22/2023 1335   PLT 186 04/23/2016 1303   MCV 80.9 11/21/2023 1832   MCV 81.9 04/23/2016 1303   MCH 27.7 11/21/2023 1832   MCHC 34.2 11/21/2023 1832   RDW 14.0 11/21/2023 1832   RDW 13.9 04/23/2016 1303   LYMPHSABS 1.6 11/12/2023 0552   LYMPHSABS 1.6 04/23/2016 1303   MONOABS 0.8 11/12/2023 0552   MONOABS 0.6 04/23/2016 1303   EOSABS 0.1 11/12/2023 0552   EOSABS 0.2 04/23/2016 1303   BASOSABS 0.1 11/12/2023 0552   BASOSABS 0.1 04/23/2016 1303    Assessment/Plan:  AKI, recurrent - presumably due to pre-renal/hypovolemia which lead to ischemic ATN in setting of hypotension.  She is feeling better and her  BUN/Cr have started to improve.  Renal US  last week was unremarkable.  Hold off on repeating renal US  for now.  Continue with IVFs with lactated ringers  and follow UOP and daily Scr.  No current indication for dialysis at this time and will continue to follow.  BUN/Cr slowly improving.  BUN/Cr slowly improving with IVF's.  Would continue with IVF's given poor po intake.  Stressed the importance of keeping up with fluids and eating.  She does have new blood and protein on UA.  Will start GN workup for completeness.  Avoid nephrotoxic medications including NSAIDs and iodinated intravenous contrast exposure unless the latter is absolutely indicated.   Preferred narcotic agents for pain control are hydromorphone , fentanyl , and methadone. Morphine  should not be used.  Avoid Baclofen and avoid oral sodium phosphate and magnesium  citrate based laxatives / bowel preps.  Continue strict Input and Output monitoring. Will monitor the patient closely with you and intervene or adjust therapy as indicated by changes in clinical status/labs  Hypokalemia - replete and follow closely (did have an issue with hyperkalemia at last hospitalization). FTT - not eating at home and was started on remeron at bedtime.  Reports improved appetite this morning.  HTN - hypotensive due to volume depletion, hold meds MGUS - SPEP/UPEP pending.  Chronic combined systolic and diastolic CHF - last ECHO with EF 50% and grade I DD with normal RV function.  Stop jardiance  and would not resume given advanced age and propensity for volume depletion. Hypothyroidism - on synthroid . Dizziness - with recent fall.  Will checked CK level and was normal HLD on crestor  at home, currently on hold.  Fairy RONAL Sellar, MD [facility]

## 2023-11-23 NOTE — Progress Notes (Signed)
 Mobility Specialist Progress Note:    11/23/23 1138  Mobility  Activity Respositioned in chair (Heel/Toe Raises, leg ext, leg lifts)  Level of Assistance Standby assist, set-up cues, supervision of patient - no hands on  Assistive Device None  Range of Motion/Exercises Left leg;Right leg  Activity Response Tolerated well  Mobility Referral Yes  Mobility visit 1 Mobility  Mobility Specialist Start Time (ACUTE ONLY) 1138  Mobility Specialist Stop Time (ACUTE ONLY) 1150  Mobility Specialist Time Calculation (min) (ACUTE ONLY) 12 min   Received pt in recliner w/ family in room agreeable to do some exercises in the chair since previously working w/ OT. No c/o any symptoms. Pt able to move and perform exercises well. Left pt in recliner w/ all needs met.   Venetia Keel Mobility Specialist Please Neurosurgeon or Rehab Office at [phone]

## 2023-11-24 DIAGNOSIS — N179 Acute kidney failure, unspecified: Secondary | ICD-10-CM | POA: Diagnosis not present

## 2023-11-24 LAB — CBC
HCT: 30.8 % — ABNORMAL LOW (ref 36.0–46.0)
Hemoglobin: 10.5 g/dL — ABNORMAL LOW (ref 12.0–15.0)
MCH: 27 pg (ref 26.0–34.0)
MCHC: 34.1 g/dL (ref 30.0–36.0)
MCV: 79.2 fL — ABNORMAL LOW (ref 80.0–100.0)
Platelets: 115 K/uL — ABNORMAL LOW (ref 150–400)
RBC: 3.89 MIL/uL (ref 3.87–5.11)
RDW: 14.4 % (ref 11.5–15.5)
WBC: 4.4 K/uL (ref 4.0–10.5)
nRBC: 0 % (ref 0.0–0.2)

## 2023-11-24 LAB — ANCA PROFILE
Anti-MPO Antibodies: <0.2 U (ref 0.0–0.9)
Anti-PR3 Antibodies: <0.2 U (ref 0.0–0.9)
Atypical P-ANCA titer: <1:20 {titer}
C-ANCA: <1:20 {titer}
P-ANCA: <1:20 {titer}

## 2023-11-24 LAB — RENAL FUNCTION PANEL
Albumin: 2.3 g/dL — ABNORMAL LOW (ref 3.5–5.0)
Anion gap: 9 (ref 5–15)
BUN: 12 mg/dL (ref 8–23)
CO2: 22 mmol/L (ref 22–32)
Calcium: 8.7 mg/dL — ABNORMAL LOW (ref 8.9–10.3)
Chloride: 111 mmol/L (ref 98–111)
Creatinine, Ser: 2.4 mg/dL — ABNORMAL HIGH (ref 0.44–1.00)
GFR, Estimated: 20 mL/min — ABNORMAL LOW (ref >60)
Glucose, Bld: 102 mg/dL — ABNORMAL HIGH (ref 70–99)
Phosphorus: 2 mg/dL — ABNORMAL LOW (ref 2.5–4.6)
Potassium: 3.3 mmol/L — ABNORMAL LOW (ref 3.5–5.1)
Sodium: 142 mmol/L (ref 135–145)

## 2023-11-24 LAB — EXTRACTABLE NUCLEAR ANTIGEN ANTIBODY
ENA SM Ab Ser-aCnc: <0.2 AI (ref 0.0–0.9)
Ribonucleic Protein: <0.2 AI (ref 0.0–0.9)
SSA (Ro) (ENA) Antibody, IgG: <0.2 AI (ref 0.0–0.9)
SSB (La) (ENA) Antibody, IgG: <0.2 AI (ref 0.0–0.9)
Scleroderma (Scl-70) (ENA) Antibody, IgG: <0.2 AI (ref 0.0–0.9)
ds DNA Ab: <1 [IU]/mL (ref 0–9)

## 2023-11-24 LAB — MAGNESIUM: Magnesium: 1.7 mg/dL (ref 1.7–2.4)

## 2023-11-24 LAB — KAPPA/LAMBDA LIGHT CHAINS
Kappa free light chain: 103.9 mg/L — ABNORMAL HIGH (ref 3.3–19.4)
Kappa, lambda light chain ratio: 3.95 — ABNORMAL HIGH (ref 0.26–1.65)
Lambda free light chains: 26.3 mg/L (ref 5.7–26.3)

## 2023-11-24 LAB — GLUCOSE, CAPILLARY: Glucose-Capillary: 113 mg/dL — ABNORMAL HIGH (ref 70–99)

## 2023-11-24 LAB — ANA W/REFLEX IF POSITIVE: Anti Nuclear Antibody (ANA): NEGATIVE

## 2023-11-24 LAB — ANTI-JO 1 ANTIBODY, IGG: Anti JO-1: <0.2 AI (ref 0.0–0.9)

## 2023-11-24 MED ORDER — POTASSIUM PHOSPHATES 15 MMOLE/5ML IV SOLN
15.0000 mmol | Freq: Once | INTRAVENOUS | Status: AC
  Administered 2023-11-24: 15 mmol via INTRAVENOUS
  Filled 2023-11-24: qty 5

## 2023-11-24 MED ORDER — MIRTAZAPINE 15 MG PO TBDP: 15.0000 mg | ORAL_TABLET | Freq: Every day | ORAL | 0 refills | Status: AC

## 2023-11-24 NOTE — Discharge Summary (Addendum)
 Physician Discharge Summary  Andrea Lowery FMW:[id number] DOB: 1940/07/08  PCP: Kahoano, Haku K, MD  Admitted from: Home Discharged to: Home.  Admit date: 11/20/2023 Discharge date: 11/24/2023  Recommendations for Outpatient Follow-up:    Follow-up Information     Kahoano, Haku K, MD. Schedule an appointment as soon as possible for a visit in 1 week(s).   Specialty: General Practice Why: To be seen with repeat labs (CBC, BMP & Phosphorus). Contact information: [address] [place] [address] [phone]         Pa, [employer]. Call.   Why: MDs office will call you with an appointment for blood tests and MD follow-up.  If you do not hear from them in 3-5 business days, please call. Contact information: [address] [place] [address] [phone]         [facility], [facility] Follow up.   Specialty: Home Health Services Why: Someone will call you to schedule first home visit. Contact information: [address] [place] [address] [phone]                  Home Health: Home Health Orders (From admission, onward)     Start     Ordered   11/24/23 1442  Home Health  At discharge       Question Answer Comment  To provide the following care/treatments PT   To provide the following care/treatments OT      11/24/23 1444             Equipment/Devices: None recommended by therapies evaluation.    Discharge Condition: Improved and stable.   Code Status: Full Code Diet recommendation:  Discharge Diet Orders (From admission, onward)     Start     Ordered   11/24/23 0000  Diet - low sodium heart healthy        11/24/23 1444             Discharge Diagnoses:  Principal Problem:   AKI (acute kidney injury)   Brief Hospital Course:  83 year old female, lives alone, with HTN, MGUS, asthma and GERD was discharged 10 days ago after treatment for acute kidney injury which was attributed to  decreased p.o. intake. Patient was readmitted 10/16 with intermittent dizziness and weakness and fall. She continued to have decreased p.o. intake. It is unclear if patient had been on the ground for a while. On admission she was noted to be hypotensive with 86/51 and somewhat bradycardic at 45. Laboratory data were notable for acute kidney injury again with creatinine of 5, up from 2.1 ten days ago at discharge.   Admitted for acute kidney injury, recurrent due to poor oral intake.  Nephrology consulting.  Improved.  Nephrology signed off.  Patient cleared for discharge by Nephrology.    Assessment & Plan:    Acute kidney injury, recurrent Suspected due to prerenal/hypovolemia and related hypotension with possible ischemic ATN. Unremarkable renal ultrasound on 10/7. Nephrology follow-up appreciated.  Since she has new blood and protein on UA, they initiated GN workup. Treated with IV fluids, held culprit medications, creatinine has steadily improved from 4.9-2.4. As per nephrology follow-up, they have cleared her for discharge from nephrology perspective, they will arrange follow-up office visit within the next week at which time her pending serologies will also be reviewed.  Continue to hold Jardiance  (indefinitely discontinued), and ARB on discharge.  Avoid nephrotoxics. And has been repeatedly counseled regarding importance of adequate p.o. intake and hydration.  She states that she is doing much better. Anti-Jo1 antibody <0.2, ENA negative.  ANA negative.  ASO, total complement, C3, C4, ANCA profile pending.   Hypokalemia/hypomagnesemia/hypophosphatemia Potassium 2.8 >3.3, magnesium  1.4 > 1.7.  Replaced low phosphorus PTA.  Outpatient follow-up with BMP and phosphorus.   Essential hypertension Controlled off of antihypertensives.  ARB held at discharge.  Continue home dose of metoprolol .   MGUS SPEP/UPEP pending.  Kappa/lambda light chains seem to be increased.  Outpatient follow-up with  nephrology.   Chronic combined systolic and diastolic CHF Most recent echo showed LVEF 50% with grade 1 diastolic dysfunction. Per nephrology, stop Jardiance  and would not resume given advanced age and propensity for volume depletion and I agree.  Clinically euvolemic.   Hypothyroid As per home med rec review by pharmacy, patient was not taking levothyroxine  PTA.  Despite this, TSH normal at 2.771.  Outpatient follow-up.   Dizziness and fall.?  BPPV. CK normal. Discharging home with home health therapies.   Dyslipidemia Continue Crestor  and Zetia .   Adult failure to thrive Poor oral intake Etiology unclear.  Patient reports that she is not eating much in the hospital because she does not like the taste of the food. Remeron 15 mg nightly initiated, continue at discharge.  Improved oral intake.   Anemia and thrombocytopenia Some of the anemia may be dilutional.  Overall not very concerning.  Thrombocytopenia is new and unclear etiology.  Follow CBC closely as outpatient.  Type II DM Previous A1c 6.4 on 10/01/2023. Not on any medications for DM except the Jardiance  which was discontinued this admission.  Close outpatient follow-up with PCP.  CBGs were reasonably controlled in the hospital without meds.   Body mass index is 23.72 kg/m.    Consultants:   Nephrology   Procedures:       Discharge Instructions  Discharge Instructions     Call MD for:  difficulty breathing, headache or visual disturbances   Complete by: As directed    Call MD for:  extreme fatigue   Complete by: As directed    Call MD for:  persistant dizziness or light-headedness   Complete by: As directed    Call MD for:  persistant nausea and vomiting   Complete by: As directed    Call MD for:  severe uncontrolled pain   Complete by: As directed    Call MD for:  temperature >100.4   Complete by: As directed    Diet - low sodium heart healthy   Complete by: As directed    Increase activity slowly    Complete by: As directed         Medication List     PAUSE taking these medications    irbesartan  300 MG tablet Wait to take this until your doctor or other care provider tells you to start again. Commonly known as: AVAPRO  Take 300 mg by mouth daily.       STOP taking these medications    empagliflozin  25 MG Tabs tablet Commonly known as: JARDIANCE    fluticasone  furoate-vilanterol 200-25 MCG/ACT Aepb Commonly known as: BREO ELLIPTA        TAKE these medications    albuterol  108 (90 Base) MCG/ACT inhaler Commonly known as: VENTOLIN  HFA INHALE 2 PUFFS EVERY 4 HOURS AS NEEDED FOR WHEEZING OR SHORTNESS OF BREATH   allopurinol 100 MG tablet Commonly known as: ZYLOPRIM Take 1 tablet (100 mg total) by mouth daily.   budesonide -formoterol  160-4.5 MCG/ACT inhaler Commonly known as: SYMBICORT  Inhale 2 puffs  into the lungs 2 (two) times daily.   EPINEPHrine  0.3 mg/0.3 mL Soaj injection Commonly known as: EPI-PEN Inject 0.3 mg into the muscle as needed for anaphylaxis. Use as directed for severe allergic reaction   ezetimibe  10 MG tablet Commonly known as: ZETIA  TAKE 1 TABLET EVERY DAY   metoprolol  succinate 50 MG 24 hr tablet Commonly known as: TOPROL -XL Take 50 mg by mouth daily. Take with or immediately following a meal.   mirtazapine 15 MG disintegrating tablet Commonly known as: REMERON SOL-TAB Take 1 tablet (15 mg total) by mouth at bedtime.   rosuvastatin  40 MG tablet Commonly known as: CRESTOR  TAKE 1 TABLET EVERY DAY (NEED MD APPOINTMENT FOR REFILLS)       Allergies  Allergen Reactions   Bee Venom Anaphylaxis    Yellow jackets   Penicillins Anaphylaxis and Hives      Procedures/Studies: DG Lumbar Spine Complete Result Date: 11/20/2023 EXAM: 4 VIEW(S) XRAY OF THE LUMBAR SPINE 11/20/2023 06:25:00 PM COMPARISON: Lumbar spine x-ray 11/11/2023. CLINICAL HISTORY: Fall, pain. Per triage notes: EMS reports Pt fell 1 week ago and she laid on the ground  3 days. Pt c/o back/buttock pain and intermittent dizziness since fall. FINDINGS: LUMBAR SPINE: BONES: The bones are diffusely osteopenic. No acute fracture. No aggressive appearing osseous lesion. Alignment is normal. DISCS AND DEGENERATIVE CHANGES: Moderate degenerative changes at L4-L5 with disc space narrowing, sclerosis and osteophyte formation. SOFT TISSUES: There are atherosclerotic calcifications of the aorta. There are surgical clips in the right upper abdomen. IMPRESSION: 1. No acute abnormality of the lumbar spine related to the reported fall and pain. 2. Moderate degenerative changes at L4-L5. Electronically signed by: Greig Pique MD 11/20/2023 07:24 PM EDT RP Workstation: [id number]   DG Sacrum/Coccyx Result Date: 11/20/2023 CLINICAL DATA:  Fall, pain EXAM: SACRUM AND COCCYX - 2+ VIEW COMPARISON:  None Available. FINDINGS: Mild symmetric degenerative changes in the hips with joint space narrowing and spurring. No acute bony abnormality. Specifically, no fracture, subluxation, or dislocation. Degenerative changes in the lower lumbar spine. IMPRESSION: No acute bony abnormality. Electronically Signed   By: Franky Crease M.D.   On: 11/20/2023 18:57    Subjective: Denies complaints. Eager to discharge. States that she has been eating and drinking well and urinating frequently. Had some dizziness earlier which got better after PT worked with her.   Discharge Exam:  Vitals:   11/23/23 2131 11/24/23 0425 11/24/23 0742 11/24/23 0828  BP: 128/60 (!) 110/55 (!) 128/58   Pulse: 62 64 64 72  Resp:  18 19 18   Temp: 98 F (36.7 C) 98.4 F (36.9 C) 97.7 F (36.5 C)   TempSrc: Oral Oral Oral   SpO2: 95% 98% 99% 98%  Weight:      Height:        General exam: Elderly female, moderately built and nourished sitting up comfortably in bed without distress.  Oral mucosa moist. Respiratory system: Clear to auscultation. Respiratory effort normal. Cardiovascular system: S1 & S2 heard, RRR. No JVD,  murmurs, rubs, gallops or clicks. No pedal edema.   Gastrointestinal system: Abdomen is nondistended, soft and nontender. No organomegaly or masses felt. Normal bowel sounds heard. Central nervous system: Alert and oriented. No focal neurological deficits. Extremities: Symmetric 5 x 5 power. Skin: No rashes, lesions or ulcers Psychiatry: Judgement and insight appear somewhat impaired. Mood & affect appropriate.     The results of significant diagnostics from this hospitalization (including imaging, microbiology, ancillary and laboratory) are listed below for reference.  Microbiology: No results found for this or any previous visit (from the past 240 hours).   Labs: CBC: Recent Labs  Lab 11/20/23 1258 11/21/23 0625 11/21/23 1832 11/24/23 0558  WBC 6.5 5.9 5.1 4.4  HGB 13.8 12.0 11.9* 10.5*  HCT 39.7 33.4* 34.8* 30.8*  MCV 77.1* 76.8* 80.9 79.2*  PLT 159 143* 133* 115*    Basic Metabolic Panel: Recent Labs  Lab 11/20/23 1258 11/21/23 0625 11/21/23 1832 11/22/23 0331 11/23/23 0418 11/23/23 0419 11/24/23 0558  NA 135 136  --  139  --  141 142  K 3.1* 3.4*  --  3.0*  --  2.8* 3.3*  CL 95* 101  --  105  --  108 111  CO2 21* 22  --  25  --  22 22  GLUCOSE 106* 86  --  95  --  90 102*  BUN 55* 45*  --  34*  --  19 12  CREATININE 4.93* 3.96* 3.64* 3.53*  --  2.75* 2.40*  CALCIUM  9.3 8.6*  --  8.5*  --  8.6* 8.7*  MG  --   --   --   --  1.4*  --  1.7  PHOS  --   --   --  4.0  --  3.5 2.0*    Liver Function Tests: Recent Labs  Lab 11/20/23 1258 11/21/23 0625 11/22/23 0331 11/23/23 0419 11/24/23 0558  AST 50* 40  --   --   --   ALT 41 33  --   --   --   ALKPHOS 80 68  --   --   --   BILITOT 1.2 0.8  --   --   --   PROT 7.4 6.0*  --   --   --   ALBUMIN 3.3* 2.7* 2.4* 2.3* 2.3*    CBG: Recent Labs  Lab 11/21/23 0920 11/21/23 1140 11/21/23 1607 11/21/23 2052 11/24/23 0745  GLUCAP 145* 153* 140* 141* 113*     Thyroid  function studies Recent Labs     11/23/23 0419  TSH 2.771    Urinalysis    Component Value Date/Time   COLORURINE YELLOW 11/21/2023 0025   APPEARANCEUR HAZY (A) 11/21/2023 0025   LABSPEC 1.006 11/21/2023 0025   PHURINE 6.0 11/21/2023 0025   GLUCOSEU >=500 (A) 11/21/2023 0025   HGBUR MODERATE (A) 11/21/2023 0025   BILIRUBINUR NEGATIVE 11/21/2023 0025   BILIRUBINUR negative 01/20/2023 1828   BILIRUBINUR Negative 07/24/2021 1419   KETONESUR NEGATIVE 11/21/2023 0025   PROTEINUR 30 (A) 11/21/2023 0025   UROBILINOGEN 0.2 01/20/2023 1828   UROBILINOGEN 0.2 09/05/2013 1543   NITRITE NEGATIVE 11/21/2023 0025   LEUKOCYTESUR NEGATIVE 11/21/2023 0025   Discussed multiple times and in detail with patient's son via phone.  PT had updated the recommendations to home health PT with 24/7 supervision.  Family however indicated that they did not have such assistance at home and wanted to explore SNF.  TOC discussed with patient who is adamant of discharging home.  Moreover given that she ambulated 250 feet with PT this morning, not sure that she is appropriate for SNF or if insurance will approve.  However since patient is declining SNF, she has capacity to make medical decisions, she would have to discharge home.  Called back and updated son who was understanding.   Time coordinating discharge: 40 minutes  SIGNED:  Trenda Mar, MD,  FACP, [facility], [facility], [facility]   [facility] Physician Advisor [facility]  To contact the attending provider between 7A-7P or the covering provider during after hours 7P-7A, please log into the web site [email or web address] and access using universal [place] password for that web site. If you do not have the password, please call the hospital operator.

## 2023-11-24 NOTE — TOC Progression Note (Signed)
 Transition of Care [facility]) - Progression Note    Patient Details  Name: Andrea Lowery MRN: [id number] Date of Birth: 17-Oct-1940  [facility] CM/SW Contact  Lendia Dais, [place] Phone Number: 11/24/2023, 10:49 AM  Clinical Narrative:  Pt gave CSW permission to speak to son Medford about for decisions about SNF placement.  CSW spoke to [place] via phone and stated that the patient was agreeable to SNF and has a preference of [facility].  CSW that they will reach out to [facility] to see if they were able to offer a bed and can leave acceptance list of SNF's at bedside.  Medford expressed gratitude.                     Expected Discharge Plan and Services                                               Social Drivers of Health (SDOH) Interventions SDOH Screenings   Food Insecurity: No Food Insecurity (11/21/2023)  Housing: Low Risk  (11/21/2023)  Transportation Needs: No Transportation Needs (11/21/2023)  Utilities: Not At Risk (11/21/2023)  Alcohol  Screen: Low Risk  (07/31/2022)  Depression (PHQ2-9): Medium Risk (09/17/2022)  Financial Resource Strain: Low Risk  (07/31/2022)  Physical Activity: Inactive (07/31/2022)  Social Connections: Moderately Integrated (11/21/2023)  Stress: Stress Concern Present (07/31/2022)  Tobacco Use: Medium Risk (11/20/2023)    Readmission Risk Interventions    11/13/2023   10:35 AM  Readmission Risk Prevention Plan  Post Dischage Appt Complete  Medication Screening Complete  Transportation Screening Complete

## 2023-11-24 NOTE — TOC Transition Note (Signed)
 Transition of Care [facility]) - Discharge Note   Patient Details  Name: Andrea Lowery MRN: [id number] Date of Birth: February 16, 1940  Transition of Care [facility]) CM/SW Contact:  Andrea Lowery, Andrea Muskrat, RN Phone Number: 11/24/2023, 2:51 PM   Clinical Narrative:     Patient is scheduled for discharge today.  Readmission Risk Assessment done. Home health info, Outpatient f/u, hospital f/u and discharge instructions on AVS. Son, Andrea Lowery at bedside and will transport at discharge.  No further ICM needs noted.      Final next level of care: Home w Home Health Services Barriers to Discharge: Barriers Resolved   Patient Goals and CMS Choice Patient states their goals for this hospitalization and ongoing recovery are:: To return home CMS Medicare.gov Compare Post Acute Care list provided to:: Patient   [place] ownership interest in [facility].provided to:: Adult Children (Son, Andrea Lowery)    Discharge Placement                Patient to be transferred to facility by: Son Name of family member notified: Engineer, maintenance and Services Additional resources added to the After Visit Summary for                  DME Arranged: N/A DME Agency: NA       HH Arranged: NA HH Agency: NA        Social Drivers of Health (SDOH) Interventions SDOH Screenings   Food Insecurity: No Food Insecurity (11/21/2023)  Housing: Low Risk  (11/21/2023)  Transportation Needs: No Transportation Needs (11/21/2023)  Utilities: Not At Risk (11/21/2023)  Alcohol  Screen: Low Risk  (07/31/2022)  Depression (PHQ2-9): Medium Risk (09/17/2022)  Financial Resource Strain: Low Risk  (07/31/2022)  Physical Activity: Inactive (07/31/2022)  Social Connections: Moderately Integrated (11/21/2023)  Stress: Stress Concern Present (07/31/2022)  Tobacco Use: Medium Risk (11/20/2023)     Readmission Risk Interventions    11/24/2023    2:49 PM 11/13/2023   10:35 AM   Readmission Risk Prevention Plan  Post Dischage Appt  Complete  Medication Screening  Complete  Transportation Screening Complete Complete  PCP or Specialist Appt within 5-7 Days Complete   Home Care Screening Complete   Medication Review (RN CM) Referral to Pharmacy

## 2023-11-24 NOTE — Progress Notes (Addendum)
 Physical Therapy Treatment Patient Details Name: Andrea Lowery MRN: [id number] DOB: 1940-12-23 Today's Date: 11/24/2023   History of Present Illness Pt is an 83 y.o. female who presented 11/20/23 with abnormal renal labs. Of note, pt hospitalized 1 week ago (10/7-10/9) with AKI and had a fall 1 wk prior to that without fxs. Had had intermittent dizziness, back/ buttocks pain since then.  PMH: arthritis, asthma, DM, DVT, dyslipidemia, GERD, HTN, hypokalemia, hypothyroidism, LBBB, MGUS, OSA on CPAP, migraine    PT Comments  Returned for vestibular assessment and treatment. Pt with + nystagmus with smooth pursuits to the left and abnormal saccades. (-) cover test and not able to tolerate head thrust test. Pt with positive left sidelying test with reports of dizziness and upbeating torsional nystagmus. Subsequently performed Semont maneuver to right for treatment. Pt reports mild improvement in symptoms upon return to upright. May benefit from re-testing if symptoms persist with follow up PT services.     If plan is discharge home, recommend the following: Assistance with cooking/housework;Assist for transportation   Can travel by private vehicle        Equipment Recommendations  None recommended by PT    Recommendations for Other Services       Precautions / Restrictions Precautions Precautions: Fall Restrictions Weight Bearing Restrictions Per Provider Order: No     Mobility  Bed Mobility Overal bed mobility: Modified Independent                  Transfers Overall transfer level: Needs assistance Equipment used: Rolling walker (2 wheels) Transfers: Sit to/from Stand Sit to Stand: Supervision           General transfer comment: Cues for hand placement    Ambulation/Gait Ambulation/Gait assistance: Supervision Gait Distance (Feet): 10 Feet Assistive device: Rolling walker (2 wheels) Gait Pattern/deviations: Step-through pattern, Decreased stride length        General Gait Details: Steady pace, supervision to ambulate to bathroom   Stairs             Wheelchair Mobility     Tilt Bed    Modified Rankin (Stroke Patients Only)       Balance Overall balance assessment: Needs assistance Sitting-balance support: No upper extremity supported, Feet supported Sitting balance-Leahy Scale: Good     Standing balance support: Bilateral upper extremity supported, During functional activity Standing balance-Leahy Scale: Poor                              Communication Communication Communication: No apparent difficulties  Cognition Arousal: Alert Behavior During Therapy: WFL for tasks assessed/performed   PT - Cognitive impairments: Safety/Judgement                         Following commands: Intact      Cueing Cueing Techniques: Verbal cues  Exercises      General Comments        Pertinent Vitals/Pain Pain Assessment Pain Assessment: Faces Faces Pain Scale: No hurt    Home Living                          Prior Function            PT Goals (current goals can now be found in the care plan section) Acute Rehab PT Goals Potential to Achieve Goals: Good Progress towards PT goals: Progressing toward goals  Frequency    Min 2X/week      PT Plan      Co-evaluation              AM-PAC PT 6 Clicks Mobility   Outcome Measure  Help needed turning from your back to your side while in a flat bed without using bedrails?: None Help needed moving from lying on your back to sitting on the side of a flat bed without using bedrails?: None Help needed moving to and from a bed to a chair (including a wheelchair)?: A Little Help needed standing up from a chair using your arms (e.g., wheelchair or bedside chair)?: A Little Help needed to walk in hospital room?: A Little Help needed climbing 3-5 steps with a railing? : A Little 6 Click Score: 20    End of Session Equipment  Utilized During Treatment: Gait belt Activity Tolerance: Patient tolerated treatment well Patient left: in bed;with call bell/phone within reach;with bed alarm set Nurse Communication: Mobility status PT Visit Diagnosis: Unsteadiness on feet (R26.81);Other abnormalities of gait and mobility (R26.89);Muscle weakness (generalized) (M62.81);Difficulty in walking, not elsewhere classified (R26.2);History of falling (Z91.81);Pain     Time: 8881-8867 PT Time Calculation (min) (ACUTE ONLY): 14 min  Charges:    $Therapeutic Activity: 8-22 mins PT General Charges $$ ACUTE PT VISIT: 1 Visit                     Andrea Lowery, PT, DPT Acute Rehabilitation Services Office [phone]    Andrea Lowery 11/24/2023, 12:22 PM

## 2023-11-24 NOTE — Progress Notes (Signed)
 Mobility Specialist Progress Note:    11/24/23 1208  Mobility  Activity Ambulated with assistance  Level of Assistance Standby assist, set-up cues, supervision of patient - no hands on  Assistive Device Front wheel walker  Distance Ambulated (ft) 100 ft  Activity Response Tolerated well  Mobility Referral Yes  Mobility visit 1 Mobility  Mobility Specialist Start Time (ACUTE ONLY) 0945  Mobility Specialist Stop Time (ACUTE ONLY) [id number]  Mobility Specialist Time Calculation (min) (ACUTE ONLY) 13 min   Received pt in bed hesitant but agreeable to mobility. Pt was asymptomatic throughout ambulation and returned to room w/o fault. Left in bed w/ call bell in reach and all needs met.   Thersia Minder Mobility Specialist  Please contact vis Secure Chat or  Rehab Office [phone]

## 2023-11-24 NOTE — Discharge Instructions (Signed)

## 2023-11-24 NOTE — Care Management Important Message (Signed)
 Important Message  Patient Details  Name: Andrea Lowery MRN: [id number] Date of Birth: 03/10/1940   Important Message Given:  Yes - Medicare IM     Claretta Deed 11/24/2023, 3:35 PM

## 2023-11-24 NOTE — TOC Progression Note (Signed)
 Transition of Care [facility]) - Progression Note    Patient Details  Name: Andrea Lowery MRN: [id number] Date of Birth: 1940/05/21  Transition of Care [facility]) CM/SW Contact  Andrea Lowery, [place] Phone Number: 11/24/2023, 2:00 PM  Clinical Narrative:  CSW spoke to pt at bedside and stated that she wants to go home with [facility].   CSW spoke to Andrea Lowery (son) and Andrea Lowery (daughter-in-law) and stated that no one is able to provide care for her at home. Glorianne states that the patient will hide her medicine and is very confused. CSW answered that d/t PT no longer recommending STR and the pt being at capacity to make her own decisions. Trypheia requested to have an in person meeting with the [facility], CSW, RN, and MD. CSW stated they would pass along the message to the MD.  CSW will continue to follow.                       Expected Discharge Plan and Services                                               Social Drivers of Health (SDOH) Interventions SDOH Screenings   Food Insecurity: No Food Insecurity (11/21/2023)  Housing: Low Risk  (11/21/2023)  Transportation Needs: No Transportation Needs (11/21/2023)  Utilities: Not At Risk (11/21/2023)  Alcohol  Screen: Low Risk  (07/31/2022)  Depression (PHQ2-9): Medium Risk (09/17/2022)  Financial Resource Strain: Low Risk  (07/31/2022)  Physical Activity: Inactive (07/31/2022)  Social Connections: Moderately Integrated (11/21/2023)  Stress: Stress Concern Present (07/31/2022)  Tobacco Use: Medium Risk (11/20/2023)    Readmission Risk Interventions    11/13/2023   10:35 AM  Readmission Risk Prevention Plan  Post Dischage Appt Complete  Medication Screening Complete  Transportation Screening Complete

## 2023-11-24 NOTE — Progress Notes (Signed)
 [facility] Progress Note    Assessment/ Plan:    AKI, recurrent - presumably due to pre-renal/hypovolemia which lead to ischemic ATN in setting of hypotension.  She is feeling better and her BUN/Cr continues to improve, down to 2.4 (peak Cr 4.93).  Renal US  last week was unremarkable.  Hold off on repeating renal US  for now.  Will stop fluids and follow UOP and daily Scr.  No current indication for dialysis at this time Stressed the importance of keeping up with fluids and eating.  Hold Jardiance , hydrochlorothiazide , ARB on discharge. Will follow up serologies/work up as an outpatient. Okay for discharge from my end, will sign off Avoid nephrotoxic medications including NSAIDs and iodinated intravenous contrast exposure unless the latter is absolutely indicated.  Preferred narcotic agents for pain control are hydromorphone , fentanyl , and methadone. Morphine  should not be used. Avoid Baclofen and avoid oral sodium phosphate and magnesium  citrate based laxatives / bowel preps. Continue strict Input and Output monitoring. Will monitor the patient closely with you and intervene or adjust therapy as indicated by changes in clinical status/labs   Hypokalemia - replete and follow closely (did have an issue with hyperkalemia at last hospitalization). FTT - not eating at home and was started on remeron at bedtime.  Reports improved appetite HTN - hypotensive due to volume depletion, hold meds, can revisit as an outpatient MGUS - SPEP/UPEP pending. Will follow as an outpatient. Chronic combined systolic and diastolic CHF - last ECHO with EF 50% and grade I DD with normal RV function.  Stop jardiance  and would not resume given advanced age and propensity for volume depletion. Hypothyroidism - on synthroid . Dizziness - with recent fall. Per primary HLD on crestor  at home, currently on hold.  Okay for discharge from a nephrology perspective. Will sign off. Will arrange for follow up at our  office within the next week and follow up serologies then.  Discussed with cousin and granddaughter at bedside. Discussed with primary service.  Subjective:   Patient seen and examined bedside. She reports feeling much better. Now eating (since 1am) a lot as per patient. She reports that she is urinating a lot now. Eager to go home. About to start PT. No complaints   Objective:   BP (!) 128/58 (BP Location: Right Arm)   Pulse 72   Temp 97.7 F (36.5 C) (Oral)   Resp 18   Ht 5' 8 (1.727 m)   Wt 70.8 kg   SpO2 98%   BMI 23.72 kg/m   Intake/Output Summary (Last 24 hours) at 11/24/2023 1123 Last data filed at 11/24/2023 0742 Gross per 24 hour  Intake 2745.42 ml  Output 750 ml  Net 1995.42 ml   Weight change:   Physical Exam: Gen: NAD CVS: RRR Resp: CTA BL, normal wob, unlabored Abd: soft, nt/nd Ext: nonpitting edema b/l Les Neuro: awake, alert  Imaging: No results found.  Labs: BMET Recent Labs  Lab 11/20/23 1258 11/21/23 0625 11/21/23 1832 11/22/23 0331 11/23/23 0419 11/24/23 0558  NA 135 136  --  139 141 142  K 3.1* 3.4*  --  3.0* 2.8* 3.3*  CL 95* 101  --  105 108 111  CO2 21* 22  --  25 22 22   GLUCOSE 106* 86  --  95 90 102*  BUN 55* 45*  --  34* 19 12  CREATININE 4.93* 3.96* 3.64* 3.53* 2.75* 2.40*  CALCIUM  9.3 8.6*  --  8.5* 8.6* 8.7*  PHOS  --   --   --  4.0 3.5 2.0*   CBC Recent Labs  Lab 11/20/23 1258 11/21/23 0625 11/21/23 1832 11/24/23 0558  WBC 6.5 5.9 5.1 4.4  HGB 13.8 12.0 11.9* 10.5*  HCT 39.7 33.4* 34.8* 30.8*  MCV 77.1* 76.8* 80.9 79.2*  PLT 159 143* 133* 115*    Medications:     allopurinol  100 mg Oral Daily   ezetimibe   10 mg Oral Daily   fluticasone  furoate-vilanterol  1 puff Inhalation Daily   heparin  5,000 Units Subcutaneous Q8H   levothyroxine   50 mcg Oral Q0600   lidocaine   3 patch Transdermal Q24H   mirtazapine  15 mg Oral QHS   potassium chloride   40 mEq Oral BID      Ephriam Stank, MD [facility] 11/24/2023, 11:23 AM

## 2023-11-24 NOTE — Progress Notes (Addendum)
 Physical Therapy Treatment Patient Details Name: Andrea Lowery MRN: [id number] DOB: 1941/01/28 Today's Date: 11/24/2023   History of Present Illness Pt is an 83 y.o. female who presented 11/20/23 with abnormal renal labs. Of note, pt hospitalized 1 week ago (10/7-10/9) with AKI and had a fall 1 wk prior to that without fxs. Had had intermittent dizziness, back/ buttocks pain since then.  PMH: arthritis, asthma, DM, DVT, dyslipidemia, GERD, HTN, hypokalemia, hypothyroidism, LBBB, MGUS, OSA on CPAP, migraine    PT Comments  Pt with significant improvement in comparison to evaluation on 10/17. Pt reports dizziness with head turns to left, but not reproduced this session with exiting the bed or with hallway ambulation. Pt transitioning out of bed without physical assist and ambulating 250 ft with a RW and CGA. No gross instability or loss of balance with use of external device. Some safety cueing provided in terms of hand placement. In light of progress, updated recommendation to HHPT. Discussed with pt and pt son, Medford. Medford states pt will have 24/7 supervision.  Pt declined vestibular testing during session, but agreeable after discussion with RN. Vestibular assessment note to follow.   If plan is discharge home, recommend the following: Assistance with cooking/housework;Assist for transportation   Can travel by private vehicle        Equipment Recommendations  None recommended by PT    Recommendations for Other Services       Precautions / Restrictions Precautions Precautions: Fall Restrictions Weight Bearing Restrictions Per Provider Order: No     Mobility  Bed Mobility Overal bed mobility: Modified Independent                  Transfers Overall transfer level: Needs assistance Equipment used: Rolling walker (2 wheels) Transfers: Sit to/from Stand Sit to Stand: Supervision                Ambulation/Gait Ambulation/Gait assistance: Contact guard  assist Gait Distance (Feet): 250 Feet Assistive device: Rolling walker (2 wheels) Gait Pattern/deviations: Step-through pattern, Decreased stride length       General Gait Details: Steady pace, CGA for safety   Stairs             Wheelchair Mobility     Tilt Bed    Modified Rankin (Stroke Patients Only)       Balance Overall balance assessment: Needs assistance Sitting-balance support: No upper extremity supported, Feet supported Sitting balance-Leahy Scale: Good     Standing balance support: Bilateral upper extremity supported, During functional activity Standing balance-Leahy Scale: Poor                              Communication Communication Communication: No apparent difficulties  Cognition Arousal: Alert Behavior During Therapy: WFL for tasks assessed/performed   PT - Cognitive impairments: Safety/Judgement                         Following commands: Intact      Cueing Cueing Techniques: Verbal cues  Exercises      General Comments        Pertinent Vitals/Pain Pain Assessment Pain Assessment: Faces Faces Pain Scale: No hurt    Home Living                          Prior Function            PT Goals (  current goals can now be found in the care plan section) Acute Rehab PT Goals Potential to Achieve Goals: Good Progress towards PT goals: Progressing toward goals    Frequency    Min 2X/week      PT Plan      Co-evaluation              AM-PAC PT 6 Clicks Mobility   Outcome Measure  Help needed turning from your back to your side while in a flat bed without using bedrails?: None Help needed moving from lying on your back to sitting on the side of a flat bed without using bedrails?: None Help needed moving to and from a bed to a chair (including a wheelchair)?: A Little Help needed standing up from a chair using your arms (e.g., wheelchair or bedside chair)?: A Little Help needed to  walk in hospital room?: A Little Help needed climbing 3-5 steps with a railing? : A Little 6 Click Score: 20    End of Session Equipment Utilized During Treatment: Gait belt Activity Tolerance: Patient tolerated treatment well Patient left: in bed;with call bell/phone within reach;with bed alarm set Nurse Communication: Mobility status PT Visit Diagnosis: Unsteadiness on feet (R26.81);Other abnormalities of gait and mobility (R26.89);Muscle weakness (generalized) (M62.81);Difficulty in walking, not elsewhere classified (R26.2);History of falling (Z91.81);Pain     Time: 1029-1100 PT Time Calculation (min) (ACUTE ONLY): 31 min  Charges:    $Therapeutic Activity: 23-37 mins PT General Charges $$ ACUTE PT VISIT: 1 Visit                     Aleck Daring, PT, DPT Acute Rehabilitation Services Office [phone]    Aleck ONEIDA Daring 11/24/2023, 12:17 PM

## 2023-11-24 NOTE — Progress Notes (Signed)
 PROGRESS NOTE   Andrea Lowery  FMW:[id number]    DOB: Nov 01, 1940    DOA: 11/20/2023  PCP: Kahoano, Haku K, MD   I have briefly reviewed patients previous medical records in [facility].   Brief Hospital Course:  83 year old female, lives alone, with HTN, MGUS, asthma and GERD was discharged 10 days ago after treatment for acute kidney injury which was attributed to decreased p.o. intake. Patient was readmitted 10/16 with intermittent dizziness and weakness and fall. She continued to have decreased p.o. intake. It is unclear if patient had been on the ground for a while. On admission she was noted to be hypotensive with 86/51 and somewhat bradycardic at 45. Laboratory data were notable for acute kidney injury again with creatinine of 5, up from 2.1 ten days ago at discharge.  Admitted for acute kidney injury, recurrent due to poor oral intake.  Nephrology consulting.  Improved.  Nephrology signed off.  Patient cleared for discharge.  Patient did better with PT who now updated recommendations from SNF to home health with PT provided patient has 24/7 supervision/assistance at home.  Family however indicates that there is no such support.  They were under the impression that patient will first be going to SNF for STR and then DC home with 24/7 supervision.  They are requesting SNF.  ICM consulted.   Assessment & Plan:   Acute kidney injury, recurrent Suspected due to prerenal/hypovolemia and related hypotension with possible ischemic ATN. Unremarkable renal ultrasound on 10/7. Nephrology follow-up appreciated.  Since she has new blood and protein on UA, they initiated GN workup. Treated with IV fluids, held culprit medications, creatinine has steadily improved from 4.9-2.4. As per nephrology follow-up, they have cleared her for discharge from nephrology perspective, they will arrange follow-up office visit within the next week at which time her pending serologies will also be reviewed.   Continue to hold Jardiance , HCTZ and ARB on discharge.  Avoid nephrotoxics. And has been repeatedly counseled regarding importance of adequate p.o. intake and hydration.  She states that she is doing much better.  Hypokalemia/hypomagnesemia/hypophosphatemia Potassium 2.8 >3.3, magnesium  1.4 > 1.7.  Replace and follow  Essential hypertension Controlled off of antihypertensives.  Monitor.  MGUS SPEP/UPEP pending.  Outpatient follow-up.  Chronic combined systolic and diastolic CHF Most recent echo showed LVEF 50% with grade 1 diastolic dysfunction. Per nephrology, stop Jardiance  and would not resume given advanced age and propensity for volume depletion and I agree.  Clinically euvolemic.  Hypothyroid Continue Synthroid   Dizziness and fall.?  BPPV. CK normal. PT and OT had recommended SNF.  See updated recommendation and discharge disposition discussion above.  She will need outpatient vestibular rehab. No pain reported today.  Dyslipidemia On Crestor  at home, currently on hold and resume at discharge.  Adult failure to thrive Poor oral intake Etiology unclear.  Patient reports that she is not eating much in the hospital because she does not like the taste of the food. Remeron 15 mg nightly initiated, continue at discharge.  Anemia and thrombocytopenia Some of the anemia may be dilutional.  Overall not concerning.  Thrombocytopenia is new and unclear etiology.  Follow CBC closely as outpatient.  Body mass index is 23.72 kg/m.   DVT prophylaxis: heparin injection 5,000 Units Start: 11/21/23 2200 SCDs Start: 11/20/23 1703     Code Status: Full Code:  Family Communication: Son via phone, updated care and answered all questions.  Attempted to speak with granddaughter and cousin at bedside but they were  having disagreement with the patient and did not engage with p MD Disposition:  Patient is medically ready for DC pending disposition decision.  ICM consulted for SNF.      Consultants:   Nephrology  Procedures:     Subjective:  Denies complaints.  Eager to discharge.  States that she has been eating and drinking well and urinating frequently.  Had some dizziness earlier which got better after PT worked with her.  Objective:   Vitals:   11/23/23 2131 11/24/23 0425 11/24/23 0742 11/24/23 0828  BP: 128/60 (!) 110/55 (!) 128/58   Pulse: 62 64 64 72  Resp:  18 19 18   Temp: 98 F (36.7 C) 98.4 F (36.9 C) 97.7 F (36.5 C)   TempSrc: Oral Oral Oral   SpO2: 95% 98% 99% 98%  Weight:      Height:        General exam: Elderly female, moderately built and nourished sitting up comfortably in bed without distress.  Oral mucosa moist. Respiratory system: Clear to auscultation. Respiratory effort normal. Cardiovascular system: S1 & S2 heard, RRR. No JVD, murmurs, rubs, gallops or clicks. No pedal edema.   Gastrointestinal system: Abdomen is nondistended, soft and nontender. No organomegaly or masses felt. Normal bowel sounds heard. Central nervous system: Alert and oriented. No focal neurological deficits. Extremities: Symmetric 5 x 5 power. Skin: No rashes, lesions or ulcers Psychiatry: Judgement and insight appear somewhat impaired. Mood & affect appropriate.     Data Reviewed:   I have personally reviewed following labs and imaging studies   CBC: Recent Labs  Lab 11/21/23 0625 11/21/23 1832 11/24/23 0558  WBC 5.9 5.1 4.4  HGB 12.0 11.9* 10.5*  HCT 33.4* 34.8* 30.8*  MCV 76.8* 80.9 79.2*  PLT 143* 133* 115*    Basic Metabolic Panel: Recent Labs  Lab 11/20/23 1258 11/21/23 0625 11/21/23 1832 11/22/23 0331 11/23/23 0418 11/23/23 0419 11/24/23 0558  NA 135 136  --  139  --  141 142  K 3.1* 3.4*  --  3.0*  --  2.8* 3.3*  CL 95* 101  --  105  --  108 111  CO2 21* 22  --  25  --  22 22  GLUCOSE 106* 86  --  95  --  90 102*  BUN 55* 45*  --  34*  --  19 12  CREATININE 4.93* 3.96* 3.64* 3.53*  --  2.75* 2.40*  CALCIUM  9.3 8.6*  --   8.5*  --  8.6* 8.7*  MG  --   --   --   --  1.4*  --  1.7  PHOS  --   --   --  4.0  --  3.5 2.0*    Liver Function Tests: Recent Labs  Lab 11/20/23 1258 11/21/23 0625 11/22/23 0331 11/23/23 0419 11/24/23 0558  AST 50* 40  --   --   --   ALT 41 33  --   --   --   ALKPHOS 80 68  --   --   --   BILITOT 1.2 0.8  --   --   --   PROT 7.4 6.0*  --   --   --   ALBUMIN 3.3* 2.7* 2.4* 2.3* 2.3*    CBG: Recent Labs  Lab 11/21/23 1607 11/21/23 2052 11/24/23 0745  GLUCAP 140* 141* 113*    Microbiology Studies:  No results found for this or any previous visit (from the past 240 hours).  Radiology Studies:  No results found.  Scheduled Meds:    allopurinol  100 mg Oral Daily   ezetimibe   10 mg Oral Daily   fluticasone  furoate-vilanterol  1 puff Inhalation Daily   heparin  5,000 Units Subcutaneous Q8H   levothyroxine   50 mcg Oral Q0600   lidocaine   3 patch Transdermal Q24H   mirtazapine  15 mg Oral QHS   potassium chloride   40 mEq Oral BID    Continuous Infusions:    lactated ringers  125 mL/hr at 11/23/23 1155   potassium PHOSPHATE IVPB (in mmol) 15 mmol (11/24/23 0839)     LOS: 3 days     Andrea Mar, MD,  FACP, [facility], [facility], [facility]   [facility] [place]      To contact the attending provider between 7A-7P or the covering provider during after hours 7P-7A, please log into the web site [email or web address] and access using universal [place] password for that web site. If you do not have the password, please call the hospital operator.  11/24/2023, 1:15 PM

## 2023-11-25 ENCOUNTER — Other Ambulatory Visit: Payer: Self-pay | Admitting: Allergy and Immunology

## 2023-11-25 LAB — COMPLEMENT, TOTAL: Compl, Total (CH50): 54 U/mL (ref >41)

## 2023-11-25 LAB — MULTIPLE MYELOMA PANEL, SERUM
Albumin SerPl Elph-Mcnc: 2.7 g/dL — ABNORMAL LOW (ref 2.9–4.4)
Albumin/Glob SerPl: 1 (ref 0.7–1.7)
Alpha 1: 0.2 g/dL (ref 0.0–0.4)
Alpha2 Glob SerPl Elph-Mcnc: 0.7 g/dL (ref 0.4–1.0)
B-Globulin SerPl Elph-Mcnc: 0.9 g/dL (ref 0.7–1.3)
Gamma Glob SerPl Elph-Mcnc: 1.2 g/dL (ref 0.4–1.8)
Globulin, Total: 3 g/dL (ref 2.2–3.9)
IgA: 163 mg/dL (ref 64–422)
IgG (Immunoglobin G), Serum: 1527 mg/dL (ref 586–1602)
IgM (Immunoglobulin M), Srm: 53 mg/dL (ref 26–217)
M Protein SerPl Elph-Mcnc: 0.8 g/dL — ABNORMAL HIGH
Total Protein ELP: 5.7 g/dL — ABNORMAL LOW (ref 6.0–8.5)

## 2023-11-26 LAB — C4 COMPLEMENT: Complement C4, Body Fluid: 41 mg/dL — ABNORMAL HIGH (ref 12–38)

## 2023-11-26 LAB — C3 COMPLEMENT: C3 Complement: 183 mg/dL — ABNORMAL HIGH (ref 82–167)

## 2023-11-26 LAB — ANTISTREPTOLYSIN O TITER: ASO: <20 [IU]/mL (ref 0.0–200.0)

## 2023-11-27 ENCOUNTER — Other Ambulatory Visit: Payer: Self-pay | Admitting: Allergy and Immunology

## 2023-11-28 ENCOUNTER — Ambulatory Visit: Admitting: Internal Medicine

## 2024-01-14 ENCOUNTER — Telehealth: Payer: Self-pay | Admitting: Allergy and Immunology

## 2024-01-14 NOTE — Telephone Encounter (Signed)
 Called to schedule [facility]  reapproval. Voicemail was not setup, could not leave message.

## 2024-02-09 ENCOUNTER — Ambulatory Visit: Attending: Internal Medicine | Admitting: Internal Medicine

## 2024-02-09 ENCOUNTER — Encounter: Payer: Self-pay | Admitting: Internal Medicine

## 2024-02-09 VITALS — BP 170/80 | HR 72 | Ht 66.0 in | Wt 172.9 lb

## 2024-02-09 DIAGNOSIS — I10 Essential (primary) hypertension: Secondary | ICD-10-CM

## 2024-02-09 DIAGNOSIS — I2510 Atherosclerotic heart disease of native coronary artery without angina pectoris: Secondary | ICD-10-CM

## 2024-02-09 MED ORDER — METOPROLOL SUCCINATE ER 50 MG PO TB24: 50.0000 mg | ORAL_TABLET | Freq: Every day | ORAL | 3 refills | Status: AC

## 2024-02-09 MED ORDER — HYDRALAZINE HCL 50 MG PO TABS: 75.0000 mg | ORAL_TABLET | Freq: Three times a day (TID) | ORAL | 1 refills | Status: AC

## 2024-02-09 NOTE — Patient Instructions (Signed)
 Medication Instructions:  Your physician has recommended you make the following change in your medication:   INCREASE: Hydralazine  to 75 mg by mouth 3 times daily  *If you need a refill on your cardiac medications before your next appointment, please call your pharmacy*  Lab Work: CMP, BNP, TSH, FLP, Apo B, Lpa, A1c at [employer]   If you have labs (blood work) drawn today and your tests are completely normal, you will receive your results only by: MyChart Message (if you have MyChart) OR A paper copy in the mail If you have any lab test that is abnormal or we need to change your treatment, we will call you to review the results.  Testing/Procedures: In 1 MONTHS- - - Your physician has referred you to the Pharmacy Clinic for Blood Pressure Management.  Please bring BP log to Clinic appointment.   Follow-Up: At [facility], you and your health needs are our priority.  As part of our continuing mission to provide you with exceptional heart care, our providers are all part of one team.  This team includes your primary Cardiologist (physician) and Advanced Practice Providers or APPs (Physician Assistants and Nurse Practitioners) who all work together to provide you with the care you need, when you need it.  Your next appointment:   4-5 month(s)  Provider:   Vina Gull, MD

## 2024-02-09 NOTE — Progress Notes (Signed)
 "   Cardiology Office Note   Date:  02/09/2024   ID:  Andrea Lowery, Andrea Lowery 1940-04-19, MRN [id number]  PCP:  Kahoano, Haku K, MD  Cardiologist:   Vina Gull, MD   Patient presents for follow up of CAD and HTN     History of Present Illness: Andrea Lowery is a 84 y.o. female with a history of LBBB, CAD (CTA 09/2023), HTN, CV dz, DVT, T2DM, OSA, MGUS  Aug 2025  Admitted for CP, HTNsive urgency, asthma  Trop negative   CCTA showed minimal nonobstructive CAD   Incedental findings of nodule   Follow up CT recommended in 6 months  Sept 2025  Seen in Follow up in clinic by GORMAN Sluder  Since Sept  Occasional SOB  Does have asthma  OVerall breathing is unchanged from August The pt  notes occasional chest discomfort with food  NOt with activity  She denies palpitations  Complains of some ankle swelling      Active Medications[1]   Allergies:   Bee venom and Penicillins   Past Medical History:  Diagnosis Date   Arthritis    Asthma    Asthma 01/10/2016   Last Assessment & Plan:  After recent ED eval- currrently utilizing prednisone  as prescribed. Begin Levaquin , prednisone  and Albuterol  MDI to be utilized as needed for acute wheezing FU in 2 day is no improvement or worsening of symptoms. Pt verbalizes understanding for current treatment plan. Follow-up in one week. Plan to increase chronic inhaled steroid dose to assess further with prevention of   Diabetes mellitus without complication (HCC)    Diabetes II   Difficulty sleeping 12/28/2013   Last Assessment & Plan:  Stable with trazodone  nightly. Continue current treatment plan. Refill trazodone  today. Tolerating medication well without side effects. Return in 3 months for follow up or sooner if any worsening of symptoms.    DOE (dyspnea on exertion) 07/01/2012   DVT (deep venous thrombosis) (HCC)    Years ago   Dyslipidemia    Eczema    GERD (gastroesophageal reflux disease)    Headache    Migraine   Hypertension     Hypokalemia 04/30/2016   Hypothyroidism    LBBB (left bundle branch block)    MGUS (monoclonal gammopathy of unknown significance)    MGUS (monoclonal gammopathy of unknown significance)    Migraine    OSA on CPAP    Renal disorder    Sinus infection    had sinus surgery   Urinary frequency 07/25/2021    Past Surgical History:  Procedure Laterality Date   ABDOMINAL HYSTERECTOMY  1969   ARTERY BIOPSY N/A 11/07/2014   Procedure: BIOPSY TEMPORAL ARTERY;  Surgeon: Daniel Moccasin, MD;  Location: [facility] OR;  Service: ENT;  Laterality: N/A;   CARPAL TUNNEL RELEASE Right    CHOLECYSTECTOMY  1976   ENDOVENOUS ABLATION SAPHENOUS VEIN W/ LASER Left 08/24/2020   endovenous laser ablation left greater saphenous vein by Medford Blade MD   LUMBAR LAMINECTOMY/DECOMPRESSION MICRODISCECTOMY N/A 07/31/2015   Procedure: L4-5 Decompression, Possible Right L4-5 Microdiscectomy;  Surgeon: Oneil JAYSON Herald, MD;  Location: [facility] OR;  Service: Orthopedics;  Laterality: N/A;   NASAL SINUS SURGERY     years ago   NM MYOVIEW  LTD  02/13/2010   No ischemia   PLANTAR FASCIA RELEASE     TONSILLECTOMY  1965   US  ECHOCARDIOGRAPHY  09/20/2008   borderline LVH,mild TR,AOV mildly sclerotic w/ca+ of the leaflets     Family History:  The patient's family history includes Dementia in her father, mother, and sister; Diabetes in her father; Gallbladder disease in her mother; Healthy in her sister; Heart failure in her father; Hypertension in her father and sister; Kidney disease in her father; Sleep apnea in her father; Thyroid  disease in her sister.    ROS:  Please see the history of present illness. All other systems are reviewed and  Negative to the above problem except as noted.    PHYSICAL EXAM: VS:  BP (!) 170/80 (BP Location: Right Arm, Patient Position: Sitting, Cuff Size: Large)   Pulse 72   Ht 5' 6 (1.676 m)   Wt 172 lb 14.4 oz (78.4 kg)   SpO2 99%   BMI 27.91 kg/m   BP on my check 160/80 GEN: Well nourished, well  developed, in no acute distress  HEENT: normal  Neck: no JVD, carotid bruits Cardiac: RRR; no murmurs Respiratory:  clear to auscultation bilaterally,No wheezes or rales  GI: soft, nontender, No hepatomegaly  Ext  Tr to 1+ nonpitting edema    EKG:  EKG is not  ordered today.   Lipid Panel    Component Value Date/Time   CHOL 239 (H) 10/01/2023 0216   CHOL 231 (H) 09/17/2022 1140   TRIG 63 10/01/2023 0216   HDL 47 10/01/2023 0216   HDL 42 09/17/2022 1140   CHOLHDL 5.1 10/01/2023 0216   VLDL 13 10/01/2023 0216   LDLCALC 179 (H) 10/01/2023 0216   LDLCALC 163 (H) 09/17/2022 1140      Wt Readings from Last 3 Encounters:  02/09/24 172 lb 14.4 oz (78.4 kg)  11/20/23 156 lb (70.8 kg)  11/13/23 156 lb 8.4 oz (71 kg)      ASSESSMENT AND PLAN:  1  CAD  PT admitted in AUg 2025   CCTA showed moderate CAD   She denies CP    Follow  Manage risk factors  2 HTN  BP remains uncontrolled   I would increase hydralazine  for 50 bid to 75 tid  Keep on Toprol  XL 50    Follow in HTN clinic   3  HL  Pt on Crestor  and Zetia    Set up for lipids, APo B and Lpa     4  REnal  Last Cr 2.4 in Oct 2025   Will repeat   I will set to see the pt in late spring/summer     Current medicines are reviewed at length with the patient today.  The patient does not have concerns regarding medicines.  Signed, Vina Gull, MD        [1]  Current Meds  Medication Sig   albuterol  (VENTOLIN  HFA) 108 (90 Base) MCG/ACT inhaler INHALE 2 PUFFS EVERY 4 HOURS AS NEEDED FOR WHEEZING OR SHORTNESS OF BREATH   budesonide -formoterol  (SYMBICORT ) 160-4.5 MCG/ACT inhaler Inhale 2 puffs into the lungs 2 (two) times daily.   EPINEPHrine  0.3 mg/0.3 mL IJ SOAJ injection Inject 0.3 mg into the muscle as needed for anaphylaxis. Use as directed for severe allergic reaction   esomeprazole  (NEXIUM ) 40 MG capsule Take 40 mg by mouth as needed.   ezetimibe  (ZETIA ) 10 MG tablet TAKE 1 TABLET EVERY DAY   Fluocinolone  Acetonide  Scalp 0.01 % OIL as needed.   hydrALAZINE  (APRESOLINE ) 50 MG tablet Take 50 mg by mouth 2 (two) times daily.   HYDROcodone -acetaminophen  (NORCO/VICODIN) 5-325 MG tablet Take 1 tablet by mouth 2 (two) times daily as needed.   [Paused] irbesartan  (AVAPRO ) 300 MG tablet  Take 300 mg by mouth daily.   JARDIANCE  25 MG TABS tablet Take 25 mg by mouth every morning.   levothyroxine  (SYNTHROID ) 50 MCG tablet Take 50 mcg by mouth every morning.   metoprolol  succinate (TOPROL -XL) 50 MG 24 hr tablet Take 50 mg by mouth daily. Take with or immediately following a meal.   rosuvastatin  (CRESTOR ) 40 MG tablet TAKE 1 TABLET EVERY DAY (NEED MD APPOINTMENT FOR REFILLS)   Current Facility-Administered Medications for the 02/09/24 encounter (Office Visit) with Okey Vina GAILS, MD  Medication   Benralizumab  SOSY 30 mg   "

## 2024-02-10 ENCOUNTER — Ambulatory Visit: Payer: Self-pay | Admitting: Internal Medicine

## 2024-02-10 DIAGNOSIS — I2510 Atherosclerotic heart disease of native coronary artery without angina pectoris: Secondary | ICD-10-CM

## 2024-02-10 DIAGNOSIS — I6523 Occlusion and stenosis of bilateral carotid arteries: Secondary | ICD-10-CM

## 2024-02-10 LAB — LIPID PANEL
Chol/HDL Ratio: 3.1 ratio (ref 0.0–4.4)
Cholesterol, Total: 172 mg/dL (ref 100–199)
HDL: 56 mg/dL
LDL Chol Calc (NIH): 94 mg/dL (ref 0–99)
Triglycerides: 126 mg/dL (ref 0–149)
VLDL Cholesterol Cal: 22 mg/dL (ref 5–40)

## 2024-02-10 LAB — COMPREHENSIVE METABOLIC PANEL WITH GFR
ALT: 24 IU/L (ref 0–32)
AST: 32 IU/L (ref 0–40)
Albumin: 4.6 g/dL (ref 3.7–4.7)
Alkaline Phosphatase: 73 IU/L (ref 48–129)
BUN/Creatinine Ratio: 12 (ref 12–28)
BUN: 8 mg/dL (ref 8–27)
Bilirubin Total: 0.7 mg/dL (ref 0.0–1.2)
CO2: 22 mmol/L (ref 20–29)
Calcium: 10 mg/dL (ref 8.7–10.3)
Chloride: 103 mmol/L (ref 96–106)
Creatinine, Ser: 0.69 mg/dL (ref 0.57–1.00)
Globulin, Total: 2.8 g/dL (ref 1.5–4.5)
Glucose: 111 mg/dL — ABNORMAL HIGH (ref 70–99)
Potassium: 3.9 mmol/L (ref 3.5–5.2)
Sodium: 141 mmol/L (ref 134–144)
Total Protein: 7.4 g/dL (ref 6.0–8.5)
eGFR: 87 mL/min/1.73

## 2024-02-10 LAB — TSH: TSH: 1.36 u[IU]/mL (ref 0.450–4.500)

## 2024-02-10 LAB — HEMOGLOBIN A1C
Est. average glucose Bld gHb Est-mCnc: 134 mg/dL
Hgb A1c MFr Bld: 6.3 % — ABNORMAL HIGH (ref 4.8–5.6)

## 2024-02-10 LAB — PRO B NATRIURETIC PEPTIDE: NT-Pro BNP: 381 pg/mL (ref 0–738)

## 2024-02-10 LAB — LIPOPROTEIN A (LPA): Lipoprotein (a): 454 nmol/L — ABNORMAL HIGH

## 2024-02-10 LAB — APOLIPOPROTEIN B: Apolipoprotein B: 82 mg/dL

## 2024-03-29 ENCOUNTER — Ambulatory Visit: Admitting: Pharmacist

## 2024-04-13 ENCOUNTER — Other Ambulatory Visit (HOSPITAL_COMMUNITY)

## 2024-07-20 ENCOUNTER — Other Ambulatory Visit

## 2024-07-30 ENCOUNTER — Ambulatory Visit: Admitting: Hematology and Oncology
